# Patient Record
Sex: Female | Born: 1950
Health system: Southern US, Community
[De-identification: ages and names within clinical notes are randomized; demographics above are authoritative.]

## PROBLEM LIST (undated history)

## (undated) DIAGNOSIS — I219 Acute myocardial infarction, unspecified: Secondary | ICD-10-CM

## (undated) DIAGNOSIS — E119 Type 2 diabetes mellitus without complications: Secondary | ICD-10-CM

## (undated) DIAGNOSIS — K5792 Diverticulitis of intestine, part unspecified, without perforation or abscess without bleeding: Secondary | ICD-10-CM

## (undated) DIAGNOSIS — K317 Polyp of stomach and duodenum: Secondary | ICD-10-CM

## (undated) DIAGNOSIS — G4730 Sleep apnea, unspecified: Secondary | ICD-10-CM

## (undated) DIAGNOSIS — M7050 Other bursitis of knee, unspecified knee: Secondary | ICD-10-CM

## (undated) DIAGNOSIS — S139XXA Sprain of joints and ligaments of unspecified parts of neck, initial encounter: Secondary | ICD-10-CM

## (undated) DIAGNOSIS — K589 Irritable bowel syndrome without diarrhea: Secondary | ICD-10-CM

## (undated) DIAGNOSIS — G8929 Other chronic pain: Secondary | ICD-10-CM

## (undated) DIAGNOSIS — M797 Fibromyalgia: Secondary | ICD-10-CM

## (undated) DIAGNOSIS — N281 Cyst of kidney, acquired: Secondary | ICD-10-CM

## (undated) DIAGNOSIS — Z973 Presence of spectacles and contact lenses: Secondary | ICD-10-CM

## (undated) DIAGNOSIS — Z8719 Personal history of other diseases of the digestive system: Secondary | ICD-10-CM

## (undated) DIAGNOSIS — E1142 Type 2 diabetes mellitus with diabetic polyneuropathy: Secondary | ICD-10-CM

## (undated) DIAGNOSIS — E785 Hyperlipidemia, unspecified: Secondary | ICD-10-CM

## (undated) DIAGNOSIS — M549 Dorsalgia, unspecified: Secondary | ICD-10-CM

## (undated) DIAGNOSIS — F329 Major depressive disorder, single episode, unspecified: Secondary | ICD-10-CM

## (undated) DIAGNOSIS — M1990 Unspecified osteoarthritis, unspecified site: Secondary | ICD-10-CM

## (undated) DIAGNOSIS — G43009 Migraine without aura, not intractable, without status migrainosus: Secondary | ICD-10-CM

## (undated) DIAGNOSIS — K219 Gastro-esophageal reflux disease without esophagitis: Secondary | ICD-10-CM

## (undated) DIAGNOSIS — E669 Obesity, unspecified: Secondary | ICD-10-CM

## (undated) DIAGNOSIS — I776 Arteritis, unspecified: Secondary | ICD-10-CM

## (undated) DIAGNOSIS — I10 Essential (primary) hypertension: Secondary | ICD-10-CM

## (undated) DIAGNOSIS — K3184 Gastroparesis: Secondary | ICD-10-CM

## (undated) DIAGNOSIS — R413 Other amnesia: Secondary | ICD-10-CM

## (undated) DIAGNOSIS — K862 Cyst of pancreas: Secondary | ICD-10-CM

## (undated) DIAGNOSIS — F419 Anxiety disorder, unspecified: Secondary | ICD-10-CM

## (undated) DIAGNOSIS — F32A Depression, unspecified: Secondary | ICD-10-CM

## (undated) DIAGNOSIS — C55 Malignant neoplasm of uterus, part unspecified: Secondary | ICD-10-CM

## (undated) DIAGNOSIS — R51 Headache: Secondary | ICD-10-CM

## (undated) DIAGNOSIS — K760 Fatty (change of) liver, not elsewhere classified: Secondary | ICD-10-CM

## (undated) HISTORY — PX: APPENDECTOMY: SHX54

## (undated) HISTORY — DX: Migraine without aura, not intractable, without status migrainosus: G43.009

## (undated) HISTORY — DX: Fatty (change of) liver, not elsewhere classified: K76.0

## (undated) HISTORY — DX: Sprain of joints and ligaments of unspecified parts of neck, initial encounter: S13.9XXA

## (undated) HISTORY — DX: Essential (primary) hypertension: I10

## (undated) HISTORY — DX: Personal history of other diseases of the digestive system: Z87.19

## (undated) HISTORY — DX: Major depressive disorder, single episode, unspecified: F32.9

## (undated) HISTORY — DX: Depression, unspecified: F32.A

## (undated) HISTORY — DX: Acute myocardial infarction, unspecified: I21.9

## (undated) HISTORY — DX: Hyperlipidemia, unspecified: E78.5

## (undated) HISTORY — DX: Type 2 diabetes mellitus with diabetic polyneuropathy: E11.42

## (undated) HISTORY — DX: Sleep apnea, unspecified: G47.30

## (undated) HISTORY — DX: Diverticulitis of intestine, part unspecified, without perforation or abscess without bleeding: K57.92

## (undated) HISTORY — DX: Anxiety disorder, unspecified: F41.9

## (undated) HISTORY — PX: TUBAL LIGATION: SHX77

## (undated) HISTORY — DX: Polyp of stomach and duodenum: K31.7

## (undated) HISTORY — DX: Cyst of kidney, acquired: N28.1

## (undated) HISTORY — DX: Fibromyalgia: M79.7

## (undated) HISTORY — DX: Malignant neoplasm of uterus, part unspecified: C55

## (undated) HISTORY — DX: Obesity, unspecified: E66.9

## (undated) HISTORY — DX: Cyst of pancreas: K86.2

## (undated) HISTORY — DX: Gastro-esophageal reflux disease without esophagitis: K21.9

## (undated) HISTORY — DX: Type 2 diabetes mellitus without complications: E11.9

## (undated) HISTORY — DX: Other amnesia: R41.3

## (undated) HISTORY — DX: Headache: R51

## (undated) HISTORY — DX: Irritable bowel syndrome, unspecified: K58.9

## (undated) HISTORY — DX: Unspecified osteoarthritis, unspecified site: M19.90

## (undated) HISTORY — PX: COLONOSCOPY: SHX174

---

## 1988-08-12 HISTORY — PX: LAPAROSCOPIC CHOLECYSTECTOMY: SUR755

## 1988-08-12 HISTORY — PX: BREAST CYST EXCISION: SHX579

## 1998-02-11 ENCOUNTER — Emergency Department (HOSPITAL_COMMUNITY): Admission: EM | Admit: 1998-02-11 | Discharge: 1998-02-11 | Payer: Self-pay | Admitting: Emergency Medicine

## 1998-08-12 HISTORY — PX: SINUS SURGERY WITH INSTATRAK: SHX5215

## 1998-08-12 HISTORY — PX: VAGINAL HYSTERECTOMY: SUR661

## 1999-01-08 ENCOUNTER — Ambulatory Visit (HOSPITAL_COMMUNITY): Admission: RE | Admit: 1999-01-08 | Discharge: 1999-01-08 | Payer: Self-pay | Admitting: Endocrinology

## 1999-01-26 ENCOUNTER — Ambulatory Visit (HOSPITAL_COMMUNITY): Admission: RE | Admit: 1999-01-26 | Discharge: 1999-01-26 | Payer: Self-pay | Admitting: Gynecology

## 1999-03-01 ENCOUNTER — Ambulatory Visit (HOSPITAL_COMMUNITY): Admission: RE | Admit: 1999-03-01 | Discharge: 1999-03-01 | Payer: Self-pay | Admitting: Gynecology

## 1999-03-01 ENCOUNTER — Encounter (INDEPENDENT_AMBULATORY_CARE_PROVIDER_SITE_OTHER): Payer: Self-pay | Admitting: Specialist

## 1999-03-08 ENCOUNTER — Ambulatory Visit (HOSPITAL_COMMUNITY): Admission: RE | Admit: 1999-03-08 | Discharge: 1999-03-08 | Payer: Self-pay | Admitting: Gynecology

## 1999-03-08 ENCOUNTER — Encounter: Payer: Self-pay | Admitting: Gynecology

## 1999-03-09 ENCOUNTER — Encounter: Payer: Self-pay | Admitting: Gynecology

## 1999-03-12 ENCOUNTER — Inpatient Hospital Stay (HOSPITAL_COMMUNITY): Admission: RE | Admit: 1999-03-12 | Discharge: 1999-03-14 | Payer: Self-pay | Admitting: Gynecology

## 1999-03-12 ENCOUNTER — Encounter (INDEPENDENT_AMBULATORY_CARE_PROVIDER_SITE_OTHER): Payer: Self-pay

## 1999-04-23 ENCOUNTER — Ambulatory Visit: Admission: RE | Admit: 1999-04-23 | Discharge: 1999-04-23 | Payer: Self-pay | Admitting: Otolaryngology

## 1999-05-01 ENCOUNTER — Ambulatory Visit (HOSPITAL_COMMUNITY): Admission: RE | Admit: 1999-05-01 | Discharge: 1999-05-01 | Payer: Self-pay | Admitting: Internal Medicine

## 1999-05-23 ENCOUNTER — Encounter (INDEPENDENT_AMBULATORY_CARE_PROVIDER_SITE_OTHER): Payer: Self-pay | Admitting: Specialist

## 1999-05-23 ENCOUNTER — Observation Stay (HOSPITAL_COMMUNITY): Admission: RE | Admit: 1999-05-23 | Discharge: 1999-05-24 | Payer: Self-pay | Admitting: Otolaryngology

## 1999-09-19 ENCOUNTER — Emergency Department (HOSPITAL_COMMUNITY): Admission: EM | Admit: 1999-09-19 | Discharge: 1999-09-20 | Payer: Self-pay | Admitting: Emergency Medicine

## 2000-03-20 ENCOUNTER — Encounter: Payer: Self-pay | Admitting: Gynecology

## 2000-03-20 ENCOUNTER — Ambulatory Visit (HOSPITAL_COMMUNITY): Admission: RE | Admit: 2000-03-20 | Discharge: 2000-03-20 | Payer: Self-pay | Admitting: Gynecology

## 2000-05-19 ENCOUNTER — Other Ambulatory Visit: Admission: RE | Admit: 2000-05-19 | Discharge: 2000-05-19 | Payer: Self-pay | Admitting: Gynecology

## 2000-07-27 ENCOUNTER — Emergency Department (HOSPITAL_COMMUNITY): Admission: EM | Admit: 2000-07-27 | Discharge: 2000-07-28 | Payer: Self-pay | Admitting: Emergency Medicine

## 2000-07-28 ENCOUNTER — Encounter: Payer: Self-pay | Admitting: Emergency Medicine

## 2000-07-28 ENCOUNTER — Encounter: Payer: Self-pay | Admitting: *Deleted

## 2000-12-09 ENCOUNTER — Other Ambulatory Visit: Admission: RE | Admit: 2000-12-09 | Discharge: 2000-12-09 | Payer: Self-pay | Admitting: Gynecology

## 2001-01-06 ENCOUNTER — Ambulatory Visit (HOSPITAL_BASED_OUTPATIENT_CLINIC_OR_DEPARTMENT_OTHER): Admission: RE | Admit: 2001-01-06 | Discharge: 2001-01-06 | Payer: Self-pay | Admitting: Orthopedic Surgery

## 2001-02-17 ENCOUNTER — Ambulatory Visit (HOSPITAL_BASED_OUTPATIENT_CLINIC_OR_DEPARTMENT_OTHER): Admission: RE | Admit: 2001-02-17 | Discharge: 2001-02-17 | Payer: Self-pay | Admitting: Orthopedic Surgery

## 2001-03-23 ENCOUNTER — Ambulatory Visit (HOSPITAL_COMMUNITY): Admission: RE | Admit: 2001-03-23 | Discharge: 2001-03-23 | Payer: Self-pay | Admitting: Gynecology

## 2001-03-23 ENCOUNTER — Encounter: Payer: Self-pay | Admitting: Gynecology

## 2001-06-25 ENCOUNTER — Other Ambulatory Visit: Admission: RE | Admit: 2001-06-25 | Discharge: 2001-06-25 | Payer: Self-pay | Admitting: Gynecology

## 2001-09-17 ENCOUNTER — Encounter: Payer: Self-pay | Admitting: Emergency Medicine

## 2001-09-17 ENCOUNTER — Emergency Department (HOSPITAL_COMMUNITY): Admission: EM | Admit: 2001-09-17 | Discharge: 2001-09-17 | Payer: Self-pay | Admitting: Emergency Medicine

## 2001-12-14 ENCOUNTER — Encounter: Admission: RE | Admit: 2001-12-14 | Discharge: 2001-12-14 | Payer: Self-pay | Admitting: Endocrinology

## 2001-12-14 ENCOUNTER — Encounter: Payer: Self-pay | Admitting: Endocrinology

## 2001-12-21 ENCOUNTER — Other Ambulatory Visit: Admission: RE | Admit: 2001-12-21 | Discharge: 2001-12-21 | Payer: Self-pay | Admitting: Gynecology

## 2002-08-09 ENCOUNTER — Other Ambulatory Visit: Admission: RE | Admit: 2002-08-09 | Discharge: 2002-08-09 | Payer: Self-pay | Admitting: Gynecology

## 2003-05-06 ENCOUNTER — Ambulatory Visit (HOSPITAL_COMMUNITY): Admission: RE | Admit: 2003-05-06 | Discharge: 2003-05-06 | Payer: Self-pay | Admitting: Endocrinology

## 2003-07-17 ENCOUNTER — Ambulatory Visit (HOSPITAL_BASED_OUTPATIENT_CLINIC_OR_DEPARTMENT_OTHER): Admission: RE | Admit: 2003-07-17 | Discharge: 2003-07-17 | Payer: Self-pay | Admitting: Internal Medicine

## 2003-07-26 ENCOUNTER — Other Ambulatory Visit: Admission: RE | Admit: 2003-07-26 | Discharge: 2003-07-26 | Payer: Self-pay | Admitting: Gynecology

## 2003-09-27 ENCOUNTER — Ambulatory Visit (HOSPITAL_COMMUNITY): Admission: RE | Admit: 2003-09-27 | Discharge: 2003-09-27 | Payer: Self-pay | Admitting: Internal Medicine

## 2003-09-27 ENCOUNTER — Encounter: Payer: Self-pay | Admitting: Internal Medicine

## 2004-05-22 ENCOUNTER — Encounter: Admission: RE | Admit: 2004-05-22 | Discharge: 2004-05-22 | Payer: Self-pay | Admitting: Endocrinology

## 2004-08-09 ENCOUNTER — Other Ambulatory Visit: Admission: RE | Admit: 2004-08-09 | Discharge: 2004-08-09 | Payer: Self-pay | Admitting: Gynecology

## 2004-10-10 ENCOUNTER — Ambulatory Visit: Payer: Self-pay | Admitting: Internal Medicine

## 2005-06-21 ENCOUNTER — Encounter: Admission: RE | Admit: 2005-06-21 | Discharge: 2005-06-21 | Payer: Self-pay | Admitting: Gynecology

## 2005-07-26 ENCOUNTER — Other Ambulatory Visit: Admission: RE | Admit: 2005-07-26 | Discharge: 2005-07-26 | Payer: Self-pay | Admitting: Gynecology

## 2006-05-08 ENCOUNTER — Ambulatory Visit: Payer: Self-pay | Admitting: Gastroenterology

## 2006-05-08 ENCOUNTER — Emergency Department (HOSPITAL_COMMUNITY): Admission: EM | Admit: 2006-05-08 | Discharge: 2006-05-08 | Payer: Self-pay | Admitting: *Deleted

## 2006-05-11 ENCOUNTER — Emergency Department (HOSPITAL_COMMUNITY): Admission: EM | Admit: 2006-05-11 | Discharge: 2006-05-12 | Payer: Self-pay | Admitting: Emergency Medicine

## 2006-05-11 ENCOUNTER — Encounter (INDEPENDENT_AMBULATORY_CARE_PROVIDER_SITE_OTHER): Payer: Self-pay | Admitting: *Deleted

## 2006-05-19 ENCOUNTER — Ambulatory Visit: Payer: Self-pay | Admitting: Internal Medicine

## 2006-06-12 ENCOUNTER — Ambulatory Visit: Payer: Self-pay | Admitting: Internal Medicine

## 2006-06-26 ENCOUNTER — Encounter (INDEPENDENT_AMBULATORY_CARE_PROVIDER_SITE_OTHER): Payer: Self-pay | Admitting: Specialist

## 2006-06-26 ENCOUNTER — Ambulatory Visit: Payer: Self-pay | Admitting: Internal Medicine

## 2006-07-01 ENCOUNTER — Ambulatory Visit: Payer: Self-pay | Admitting: Internal Medicine

## 2006-07-14 ENCOUNTER — Encounter: Admission: RE | Admit: 2006-07-14 | Discharge: 2006-07-14 | Payer: Self-pay | Admitting: Gynecology

## 2006-09-05 ENCOUNTER — Encounter: Admission: RE | Admit: 2006-09-05 | Discharge: 2006-12-04 | Payer: Self-pay | Admitting: Endocrinology

## 2006-10-27 ENCOUNTER — Other Ambulatory Visit: Admission: RE | Admit: 2006-10-27 | Discharge: 2006-10-27 | Payer: Self-pay | Admitting: Gynecology

## 2006-11-24 ENCOUNTER — Encounter: Admission: RE | Admit: 2006-11-24 | Discharge: 2007-02-22 | Payer: Self-pay | Admitting: Endocrinology

## 2007-03-05 ENCOUNTER — Encounter: Admission: RE | Admit: 2007-03-05 | Discharge: 2007-05-06 | Payer: Self-pay | Admitting: Endocrinology

## 2007-10-19 ENCOUNTER — Ambulatory Visit: Payer: Self-pay | Admitting: Internal Medicine

## 2008-01-12 ENCOUNTER — Encounter: Admission: RE | Admit: 2008-01-12 | Discharge: 2008-01-12 | Payer: Self-pay | Admitting: Gynecology

## 2009-03-01 ENCOUNTER — Encounter: Admission: RE | Admit: 2009-03-01 | Discharge: 2009-03-01 | Payer: Self-pay | Admitting: Gynecology

## 2009-03-05 ENCOUNTER — Emergency Department (HOSPITAL_COMMUNITY): Admission: EM | Admit: 2009-03-05 | Discharge: 2009-03-05 | Payer: Self-pay | Admitting: Emergency Medicine

## 2009-04-28 ENCOUNTER — Emergency Department (HOSPITAL_COMMUNITY): Admission: EM | Admit: 2009-04-28 | Discharge: 2009-04-28 | Payer: Self-pay | Admitting: Emergency Medicine

## 2009-06-21 ENCOUNTER — Encounter: Admission: RE | Admit: 2009-06-21 | Discharge: 2009-08-10 | Payer: Self-pay | Admitting: Endocrinology

## 2009-08-14 ENCOUNTER — Encounter
Admission: RE | Admit: 2009-08-14 | Discharge: 2009-08-14 | Payer: Self-pay | Source: Home / Self Care | Admitting: Endocrinology

## 2010-08-22 ENCOUNTER — Telehealth: Payer: Self-pay | Admitting: Internal Medicine

## 2010-08-24 ENCOUNTER — Ambulatory Visit
Admission: RE | Admit: 2010-08-24 | Discharge: 2010-08-24 | Payer: Self-pay | Source: Home / Self Care | Attending: Gastroenterology | Admitting: Gastroenterology

## 2010-08-24 ENCOUNTER — Other Ambulatory Visit: Payer: Self-pay | Admitting: Nurse Practitioner

## 2010-08-24 DIAGNOSIS — M797 Fibromyalgia: Secondary | ICD-10-CM | POA: Insufficient documentation

## 2010-08-24 DIAGNOSIS — E785 Hyperlipidemia, unspecified: Secondary | ICD-10-CM | POA: Insufficient documentation

## 2010-08-24 DIAGNOSIS — E119 Type 2 diabetes mellitus without complications: Secondary | ICD-10-CM | POA: Insufficient documentation

## 2010-08-24 DIAGNOSIS — I10 Essential (primary) hypertension: Secondary | ICD-10-CM | POA: Insufficient documentation

## 2010-08-24 DIAGNOSIS — K589 Irritable bowel syndrome without diarrhea: Secondary | ICD-10-CM | POA: Insufficient documentation

## 2010-08-24 DIAGNOSIS — E1165 Type 2 diabetes mellitus with hyperglycemia: Secondary | ICD-10-CM | POA: Insufficient documentation

## 2010-08-24 DIAGNOSIS — F329 Major depressive disorder, single episode, unspecified: Secondary | ICD-10-CM | POA: Insufficient documentation

## 2010-08-24 DIAGNOSIS — K7689 Other specified diseases of liver: Secondary | ICD-10-CM | POA: Insufficient documentation

## 2010-08-24 DIAGNOSIS — M129 Arthropathy, unspecified: Secondary | ICD-10-CM | POA: Insufficient documentation

## 2010-08-24 DIAGNOSIS — F411 Generalized anxiety disorder: Secondary | ICD-10-CM | POA: Insufficient documentation

## 2010-08-24 DIAGNOSIS — F419 Anxiety disorder, unspecified: Secondary | ICD-10-CM | POA: Insufficient documentation

## 2010-08-24 DIAGNOSIS — R110 Nausea: Secondary | ICD-10-CM | POA: Insufficient documentation

## 2010-08-24 DIAGNOSIS — Z8542 Personal history of malignant neoplasm of other parts of uterus: Secondary | ICD-10-CM | POA: Insufficient documentation

## 2010-08-24 DIAGNOSIS — F32A Depression, unspecified: Secondary | ICD-10-CM | POA: Insufficient documentation

## 2010-08-24 DIAGNOSIS — R1011 Right upper quadrant pain: Secondary | ICD-10-CM | POA: Insufficient documentation

## 2010-08-24 DIAGNOSIS — K219 Gastro-esophageal reflux disease without esophagitis: Secondary | ICD-10-CM | POA: Insufficient documentation

## 2010-08-24 DIAGNOSIS — Z8719 Personal history of other diseases of the digestive system: Secondary | ICD-10-CM | POA: Insufficient documentation

## 2010-08-24 DIAGNOSIS — G4733 Obstructive sleep apnea (adult) (pediatric): Secondary | ICD-10-CM | POA: Insufficient documentation

## 2010-08-24 LAB — CBC WITH DIFFERENTIAL/PLATELET
Basophils Absolute: 0 10*3/uL (ref 0.0–0.1)
Basophils Relative: 0.3 % (ref 0.0–3.0)
Eosinophils Absolute: 0 10*3/uL (ref 0.0–0.7)
Eosinophils Relative: 0.1 % (ref 0.0–5.0)
HCT: 42.2 % (ref 36.0–46.0)
Hemoglobin: 14.5 g/dL (ref 12.0–15.0)
Lymphocytes Relative: 30.1 % (ref 12.0–46.0)
Lymphs Abs: 2.6 10*3/uL (ref 0.7–4.0)
MCHC: 34.4 g/dL (ref 30.0–36.0)
MCV: 90.7 fl (ref 78.0–100.0)
Monocytes Absolute: 0.7 10*3/uL (ref 0.1–1.0)
Monocytes Relative: 8.3 % (ref 3.0–12.0)
Neutro Abs: 5.2 10*3/uL (ref 1.4–7.7)
Neutrophils Relative %: 61.2 % (ref 43.0–77.0)
Platelets: 243 10*3/uL (ref 150.0–400.0)
RBC: 4.65 Mil/uL (ref 3.87–5.11)
RDW: 12.5 % (ref 11.5–14.6)
WBC: 8.6 10*3/uL (ref 4.5–10.5)

## 2010-08-24 LAB — COMPREHENSIVE METABOLIC PANEL
ALT: 68 U/L — ABNORMAL HIGH (ref 0–35)
AST: 52 U/L — ABNORMAL HIGH (ref 0–37)
Albumin: 3.8 g/dL (ref 3.5–5.2)
Alkaline Phosphatase: 119 U/L — ABNORMAL HIGH (ref 39–117)
BUN: 14 mg/dL (ref 6–23)
CO2: 32 mEq/L (ref 19–32)
Calcium: 9.1 mg/dL (ref 8.4–10.5)
Chloride: 99 mEq/L (ref 96–112)
Creatinine, Ser: 0.6 mg/dL (ref 0.4–1.2)
GFR: 115.39 mL/min (ref >60.00)
Glucose, Bld: 314 mg/dL — ABNORMAL HIGH (ref 70–99)
Potassium: 4.6 mEq/L (ref 3.5–5.1)
Sodium: 138 mEq/L (ref 135–145)
Total Bilirubin: 0.9 mg/dL (ref 0.3–1.2)
Total Protein: 7 g/dL (ref 6.0–8.3)

## 2010-08-28 ENCOUNTER — Ambulatory Visit (HOSPITAL_COMMUNITY)
Admission: RE | Admit: 2010-08-28 | Discharge: 2010-08-28 | Payer: Self-pay | Source: Home / Self Care | Attending: Gastroenterology | Admitting: Gastroenterology

## 2010-08-29 ENCOUNTER — Encounter: Payer: Self-pay | Admitting: Nurse Practitioner

## 2010-08-29 ENCOUNTER — Telehealth: Payer: Self-pay | Admitting: Nurse Practitioner

## 2010-08-29 ENCOUNTER — Other Ambulatory Visit: Payer: Self-pay | Admitting: Nurse Practitioner

## 2010-08-29 ENCOUNTER — Ambulatory Visit
Admission: RE | Admit: 2010-08-29 | Discharge: 2010-08-29 | Payer: Self-pay | Source: Home / Self Care | Attending: Nurse Practitioner | Admitting: Nurse Practitioner

## 2010-08-29 LAB — IBC PANEL
Iron: 98 ug/dL (ref 42–145)
Saturation Ratios: 25 % (ref 20.0–50.0)
Transferrin: 280.5 mg/dL (ref 212.0–360.0)

## 2010-08-29 LAB — CONVERTED CEMR LAB
A-1 Antitrypsin, Ser: 142 mg/dL (ref 83–200)
Anti Nuclear Antibody(ANA): NEGATIVE
Ceruloplasmin: 39 mg/dL (ref 21–63)
HCV Ab: NEGATIVE
Hepatitis B Surface Ag: NEGATIVE

## 2010-08-29 LAB — FERRITIN: Ferritin: 119.5 ng/mL (ref 10.0–291.0)

## 2010-09-02 ENCOUNTER — Encounter: Payer: Self-pay | Admitting: Gynecology

## 2010-09-13 ENCOUNTER — Telehealth: Payer: Self-pay | Admitting: Nurse Practitioner

## 2010-09-13 NOTE — Progress Notes (Signed)
Summary: Results   Phone Note Call from Patient Call back at Home Phone [phone]   Caller: Patient Call For: Gunnar Fusi Reason for Call: Talk to Nurse Summary of Call: Pt would like to speak with Pam about her results Initial call taken by: [place] Johnson,  August 29, 2010 10:14 AM  Follow-up for Phone Call        I spoke to the pt and she asked about Fatty Liver disease. I did explain what it is. I did adivse her that [name] ACNP and Dr Christella Hartigan feels that she should have some more liver blood tests.  I advised pt to come to our lab , basement level. Also made her a follow up appt with Dr. Lina Sar on 10-01-2010 at 8:45 AM.  Follow-up by: Joselyn Glassman,  August 29, 2010 2:00 PM

## 2010-09-13 NOTE — Assessment & Plan Note (Signed)
Summary: abd pain, change in stool/[place]    History of Present Illness Visit Type: follow up Primary GI MD: Lina Sar MD Primary Provider: Adela Lank, MD Requesting Provider: n/a Chief Complaint: Patient c/o RUQ abdominal pain and bloating x 1 week. She also c/o intermittent nausea and loss of appetite as well as change in bowels. She has noticed "pencil like" stools. History of Present Illness:   Patient followed in the past by Dr. Juanda Chance. She was last seen in 2007 for history of IBS and GERD.  She had a normal colonoscopy Nov. 2007. Patient presents with a one week history of bloating and throbbing RUQ pain. Has had this pain a few times before but it only lasted a day and wasn't ever as severe. Pain radiates through to right back. Pain slightly worse with meals, it is not related to defecation. She very rarely takes any NSAIDS. She has some mild nausea and chills as well.  On Aciphex, ran out of pills and realized how much she needed a PPI. Back on Aciphex and asymptomatic from GERD standpoint. Patient has been voluntarily losing weight. She has lost 20 pounds or so over the last year.  Her BMs are overall okay except having some pencil size stool. Often feels constipated.        GI Review of Systems    Reports abdominal pain, bloating, loss of appetite, and  nausea.     Location of  Abdominal pain: RUQ.    Denies acid reflux, belching, chest pain, dysphagia with liquids, dysphagia with solids, heartburn, vomiting, vomiting blood, weight loss, and  weight gain.      Reports change in bowel habits, constipation, fecal incontinence, irritable bowel syndrome, and  liver problems.     Denies anal fissure, black tarry stools, diarrhea, diverticulosis, heme positive stool, hemorrhoids, jaundice, light color stool, rectal bleeding, and  rectal pain. Preventive Screening-Counseling & Management  Alcohol-Tobacco     Smoking Status: never      Drug Use:  no.      Current Medications  (verified): 1)  Wellbutrin Xl 300 Mg Xr24h-Tab (Bupropion Hcl) .... Take 1 Tablet By Mouth Once Daily 2)  Alprazolam 0.5 Mg Tabs (Alprazolam) .... Take 1 Tablet By Mouth Two Times A Day As Needed 3)  Nabumetone 500 Mg Tabs (Nabumetone) .... Take 1 Tablet By Mouth Four Time Daily As Needed 4)  Xylopan 5 Mg Tablet .... Take 1 Tablet By Mouth At Bedtime 5)  Tramadol Hcl 50 Mg Tabs (Tramadol Hcl) .... Take 1 Tablet By Mouth Four Times Daily As Needed 6)  Crestor 10 Mg Tabs (Rosuvastatin Calcium) .... Take 1 Tablet By Mouth Once A Day 7)  Avapro 300 Mg Tabs (Irbesartan) .... Take 1 Tablet By Mouth Once A Day 8)  Toprol Xl 50 Mg Xr24h-Tab (Metoprolol Succinate) .... Take 1 Tablet By Mouth Once A Day 9)  Aciphex 20 Mg Tbec (Rabeprazole Sodium) .... Take 1 Tablet By Mouth Two Times A Day 10)  Multivitamins  Tabs (Multiple Vitamin) .... Take 1 Tablet By Mouth Once A Day 11)  Fish Oil Double Strength 1200 Mg Caps (Omega-3 Fatty Acids) .... Take 1 Capsule By Mouth Once Daily 12)  Vitamin D3 2000 Unit Caps (Cholecalciferol) .... Take 1 Tablet By Mouth Once Daily 13)  Vitamin B-6 Cr 200 Mg Cr-Tabs (Pyridoxine Hcl) .... Take 1 Tablet By Mouth Once Daily 14)  Vitamin B12 (Unknown Dosage) Tablet .... Take 1 Tablet By Mouth Once A Day 15)  Effexor Xr  150 Mg Xr24h-Cap (Venlafaxine Hcl) .... Take 1 Tablet By Mouth Two Times A Day  Allergies (verified): 1)  ! Sulfa 2)  ! Codeine  Past History:  Past Medical History: Current Problems:  GASTRITIS, HX OF (ICD-V12.79) GERD (ICD-530.81) IRRITABLE BOWEL SYNDROME (ICD-564.1) DEPRESSION (ICD-311) HYPERTENSION (ICD-401.9) HYPERLIPIDEMIA (ICD-272.4) SLEEP APNEA (ICD-780.57) DIABETES MELLITUS, TYPE II (ICD-250.00) FIBROMYALGIA (ICD-729.1) FATTY LIVER DISEASE (ICD-571.8) ARTHRITIS (ICD-716.90) ANXIETY (ICD-300.00) UTERINE CANCER, HX OF (ICD-V10.42)    Past Surgical History: Appendectomy Hysterectomy Cholecystectomy  Family History: Family History of  Breast Cancer: Family History of Colon Cancer: Maternal Uncle Family History of Ovarian Cancer: Aunt Family History of Stomach Cancer: Aunt Family History of Colon Polyps: Father Family History of Diabetes: Aunt Family History of Heart Disease:  Father, Uncle, Grandparents Family History of Irritable Bowel Syndrome: Daughter, 1/2 brother  Social History: Occupation: Retired Patient has never smoked.  Alcohol Use - no Daily Caffeine Use Illicit Drug Use - no Smoking Status:  never Drug Use:  no  Review of Systems  The patient denies allergy/sinus, anemia, anxiety-new, arthritis/joint pain, back pain, blood in urine, breast changes/lumps, change in vision, confusion, cough, coughing up blood, depression-new, fainting, fatigue, fever, headaches-new, hearing problems, heart murmur, heart rhythm changes, itching, menstrual pain, muscle pains/cramps, night sweats, nosebleeds, pregnancy symptoms, shortness of breath, skin rash, sleeping problems, sore throat, swelling of feet/legs, swollen lymph glands, thirst - excessive , urination - excessive , urination changes/pain, urine leakage, vision changes, and voice change.    Vital Signs:  Patient profile:   60 year old female Height:      65 inches Weight:      183.38 pounds BMI:     30.63 BSA:     1.91 Pulse rate:   84 / minute Pulse rhythm:   regular BP sitting:   142 / 80  (left arm)  Vitals Entered By: Merri Ray CMA (AAMA) (August 24, 2010 8:21 AM)  Physical Exam  General:  Well developed, well nourished, no acute distress. Head:  Normocephalic and atraumatic. Eyes:  Conjunctiva pink, no icterus.  Neck:  no obvious masses  Lungs:  Clear throughout to auscultation. Heart:  Regular rate and rhythm; no murmurs, rubs,  or bruits. Abdomen:  Abdomen soft,  nondistended. Liver size difficult to assessNo obvious masses or hepatomegaly.Normal bowel sounds.  Msk:  Symmetrical with no gross deformities. Normal  posture. Extremities:  No palmar erythema, no edema.  Neurologic:  Alert and  oriented x4;  grossly normal neurologically. Skin:  Intact without significant lesions or rashes. Cervical Nodes:  No significant cervical adenopathy. Psych:  Alert and cooperative. Normal mood and affect.   Impression & Recommendations:  Problem # 1:  ABDOMINAL PAIN-RUQ (ICD-789.01) Assessment Deteriorated Ultrasound in 2007 revealed severe increase in liver echodensity suggestive for fatty liver or parenchymal liver disease. Patient has had a cholecystectomy. Her RUQ has been associated with nausea and occasional chills. Etiology of symptoms not clear. She had an unremarkable EGD in 2007 for nausea / vomiting and RUQ pain. Will check basic labs and obtain ultrasound of the abdomen.  Trial of Bentyl for pain.  Patient will be called with test results and any further recommendations based on those results.    Orders: Ultrasound Abdomen (UAS) TLB-CBC Platelet - w/Differential (85025-CBCD) TLB-CMP (Comprehensive Metabolic Pnl) (80053-COMP)  Problem # 2:  GERD (ICD-530.81) Assessment: Comment Only Asymptomatic on PPI.  Problem # 3:  IRRITABLE BOWEL SYNDROME (ICD-564.1) Assessment: New Complains of pencil size stools as of late. She has IBS  and suspect stool caliber change secondary to that. Normal colonoscopy November 2007.  Patient Instructions: 1)  Please go to lab, basement level. 2)  [facility].  Directions provided. 3)  We sent a perscription for Bentyl to [facility] [address] 4)  Copy sent to : Adela Lank, MD 5)  The medication list was reviewed and reconciled.  All changed / newly prescribed medications were explained.  A complete medication list was provided to the patient / caregiver. Prescriptions: BENTYL 10 MG CAPS (DICYCLOMINE HCL) Take 1 tab twice daily as needed for pain  #60 x 1   Entered by:   Lowry Ram NCMA   Authorized by:    [name] NP   Signed by:   Lowry Ram NCMA on 08/24/2010   Method used:   Electronically to        [facility]  [address] [id number] ([facility])       [address]       [place], [place]  [address]       Ph: [phone] or [phone]       Fax: [phone]   RxID:   [id number]

## 2010-09-13 NOTE — Procedures (Signed)
Summary: [facility] EGD   EGD  Procedure date:  06/26/2006  Findings:      [facility]   Patient Name: Alexis [name], Alexis [name]. MRN:  Procedure Procedures: Panendoscopy (EGD) CPT: 43235.    with biopsy(s)/brushing(s). CPT: D1846139.  Personnel: Endoscopist: Saira Kramme L. Juanda Chance, MD.  Exam Location: Exam performed in Outpatient Clinic. Outpatient  Patient Consent: Procedure, Alternatives, Risks and Benefits discussed, consent obtained, from patient. Consent was obtained by the RN.  Indications Symptoms: Nausea. Vomiting.  Surveillance of: 2005.  History  Current Medications: Patient is not currently taking Coumadin.  Pre-Exam Physical: Performed Jun 26, 2006  Cardio-pulmonary exam, HEENT exam, Abdominal exam, Extremity exam, Neurological exam, Mental status exam WNL.  Comments: Pt. history reviewed/updated, physical exam performed prior to initiation of sedation?yes Exam Exam Info: Maximum depth of insertion Duodenum, intended Duodenum. Vocal cords visualized. Gastric retroflexion performed. Images taken. ASA Classification: II. Tolerance: good.  Sedation Meds: Patient assessed and found to be appropriate for moderate (conscious) sedation. Fentanyl 50 mcg. given IV. Versed 5 mg. given IV. Cetacaine Spray 2 sprays given aerosolized.  Monitoring: BP and pulse monitoring done. Oximetry used. Supplemental O2 given  Findings - Normal: Distal Esophagus. Biopsy/Normal taken.  - Normal: Body. Comments: no food retention.  DIAGNOSTIC TEST: from Antrum. RUT done, results pending  Normal: Duodenal Apex. Biopsy/Normal taken.   Assessment Normal examination.  Comments: nothing to account for pt's symptoms Events  Unplanned Intervention: No unplanned interventions were required.  Unplanned Events: There were no complications. Plans Medication(s): Await pathology. PPI: Aciphex 20 mg BID, starting Jun 26, 2006  Promotility: Metaclopramide 10 mg ac, starting Jun 26, 2006   Disposition: After procedure patient sent to recovery. After recovery patient sent home.   cc: Adela Lank, MD  This report was created from the original endoscopy report, which was reviewed and signed by the above listed endoscopist.

## 2010-09-13 NOTE — Progress Notes (Signed)
Summary: triage   Phone Note Call from Patient Call back at [phone]   Caller: Patient Call For: Dr Juanda Chance Reason for Call: Talk to Nurse Summary of Call: Patient has severe right side pain very tender and her bowels are very thin, wants an appt but theres nothing avaiable. Initial call taken by: Tawni Levy,  August 22, 2010 10:54 AM  Follow-up for Phone Call        Patient calling to report that since last Friday, she has had pain under her right breast to waist that radiates to her back. Right side of stomach "hurts like a tooth ache." She has noticed for awhile now that off and on her stools are pencil shaped. Stools are brown in color without obvious blood. Denies fever, nausea or vomiting. She is taking Aciphex two times a day. Hx of hiatal hernia, IBS, GERD, cholecystecomy in 1990, uterine cancer s/p hysterectomy 2000. Dr Juanda Chance, you have an opening on 1/13 or I can put her on Paula's schedule tomorrow if you want her seen. Please, advise. Follow-up by: Jesse Fall RN,  August 22, 2010 11:45 AM  Additional Follow-up for Phone Call Additional follow up Details #1::        If I have an opening, I will see her. Additional Follow-up by: Hart Carwin MD,  August 22, 2010 8:12 PM     Appended Document: triage Message left for patient to call back.Jesse Fall, RN 08/23/10 8:39 AM  Appended Document: triage Spoke with patient. No appointment available with Dr. Juanda Chance. Scheduled with [name], RNP    on 08/24/10 at 1:30 PM.

## 2010-09-13 NOTE — Procedures (Signed)
Summary: [facility] COLON   Colonoscopy  Procedure date:  06/26/2006  Findings:      Location:  [facility].   Patient Name: Alexis [name], Alexis [name]. MRN:  Procedure Procedures: Colonoscopy CPT: [phone].    with biopsy. CPT: Q5068410.  Personnel: Endoscopist: Dora L. Juanda Chance, MD.  Exam Location: Exam performed in Outpatient Clinic. Outpatient  Patient Consent: Procedure, Alternatives, Risks and Benefits discussed, consent obtained, from patient. Consent was obtained by the RN.  Indications Symptoms: Diarrhea Patient is having increased frequency of stools. Patient is having soft stools.  Surveillance of: 2000.  Comments: pt is a diabetic, diarrhea occurs postprandially, no weight loss History  Current Medications: Patient is not currently taking Coumadin.  Pre-Exam Physical: Performed Jun 26, 2006. Cardio-pulmonary exam, HEENT exam , Abdominal exam, Extremity exam, Neurological exam, Mental status exam WNL.  Comments: Pt. history reviewed/updated, physical exam performed prior to initiation of sedation?yes Exam Exam: Extent of exam reached: Cecum, extent intended: Cecum.  The cecum was identified by appendiceal orifice and IC valve. Colon retroflexion performed. Images taken. ASA Classification: II. Tolerance: fair, adequate exam.  Monitoring: Pulse and BP monitoring, Oximetry used. Supplemental O2 given.  Colon Prep Used Miralax for colon prep. Prep results: good.  Sedation Meds: Patient assessed and found to be appropriate for moderate (conscious) sedation. Fentanyl 50 mcg. given IV. Versed 5 mg. given IV.  Findings - DIAGNOSTIC TEST: Biopsies taken. from Ascending Colon to Descending Colon. Reason: r/o microscopic colitis.  - NORMAL EXAM: Cecum.  - NORMAL EXAM: to Rectum.    Comments: scope withdrawal time  6:55 min Assessment Normal examination.  Events  Unplanned Interventions: No intervention was required.  Unplanned Events: There were no  complications. Plans Medication Plan: Await pathology. Antispasmodics: Hyoscyamine PO .375 BID, starting Jun 26, 2006   Disposition: After procedure patient sent to recovery. After recovery patient sent home.   cc:  Adela Lank, MD  This report was created from the original endoscopy report, which was reviewed and signed by the above listed endoscopist.

## 2010-09-19 NOTE — Progress Notes (Signed)
Summary: Results   Phone Note Call from Patient Call back at Home Phone [phone]   Caller: Patient Call For: [name] Reason for Call: Talk to Nurse Summary of Call: Pt is calling to see if her results are back yet Initial call taken by: [place] Johnson,  September 13, 2010 11:57 AM  Follow-up for Phone Call        Patient had labs and would like results. Please, advise. Follow-up by: Jesse Fall RN,  September 13, 2010 3:12 PM  Additional Follow-up for Phone Call Additional follow up Details #1::        Rene Kocher, will you let Aubriana know that her liver tests were okay. She likely has fatty liver disease but didn't find any other problems with liver. She needs to keep follow up appt. Let's get LFTs a day or so before she comes for rov. Thanks Additional Follow-up by: [name] NP,  September 14, 2010 11:25 AM    Additional Follow-up for Phone Call Additional follow up Details #2::    Spoke with patient and gave her [name], RNP recommendations. Labs scheduled for 09/29/10. Follow-up by: Jesse Fall RN,  September 14, 2010 2:01 PM

## 2010-09-27 ENCOUNTER — Encounter (INDEPENDENT_AMBULATORY_CARE_PROVIDER_SITE_OTHER): Payer: Self-pay | Admitting: *Deleted

## 2010-09-27 ENCOUNTER — Other Ambulatory Visit: Payer: PRIVATE HEALTH INSURANCE

## 2010-09-27 ENCOUNTER — Other Ambulatory Visit: Payer: Self-pay | Admitting: Internal Medicine

## 2010-09-27 DIAGNOSIS — K7689 Other specified diseases of liver: Secondary | ICD-10-CM

## 2010-09-27 LAB — HEPATIC FUNCTION PANEL
ALT: 65 U/L — ABNORMAL HIGH (ref 0–35)
AST: 59 U/L — ABNORMAL HIGH (ref 0–37)
Albumin: 3.8 g/dL (ref 3.5–5.2)
Alkaline Phosphatase: 221 U/L — ABNORMAL HIGH (ref 39–117)
Bilirubin, Direct: 0.1 mg/dL (ref 0.0–0.3)
Total Bilirubin: 0.6 mg/dL (ref 0.3–1.2)
Total Protein: 6.9 g/dL (ref 6.0–8.3)

## 2010-10-01 ENCOUNTER — Ambulatory Visit (INDEPENDENT_AMBULATORY_CARE_PROVIDER_SITE_OTHER): Payer: PRIVATE HEALTH INSURANCE | Admitting: Internal Medicine

## 2010-10-01 ENCOUNTER — Encounter: Payer: Self-pay | Admitting: Internal Medicine

## 2010-10-01 DIAGNOSIS — R1013 Epigastric pain: Secondary | ICD-10-CM

## 2010-10-01 DIAGNOSIS — R7401 Elevation of levels of liver transaminase levels: Secondary | ICD-10-CM

## 2010-10-01 DIAGNOSIS — K7689 Other specified diseases of liver: Secondary | ICD-10-CM

## 2010-10-02 ENCOUNTER — Other Ambulatory Visit (AMBULATORY_SURGERY_CENTER): Payer: PRIVATE HEALTH INSURANCE | Admitting: Internal Medicine

## 2010-10-02 ENCOUNTER — Encounter: Payer: Self-pay | Admitting: Internal Medicine

## 2010-10-02 DIAGNOSIS — R198 Other specified symptoms and signs involving the digestive system and abdomen: Secondary | ICD-10-CM

## 2010-10-02 DIAGNOSIS — R1011 Right upper quadrant pain: Secondary | ICD-10-CM

## 2010-10-03 ENCOUNTER — Ambulatory Visit: Payer: Self-pay | Admitting: Internal Medicine

## 2010-10-09 NOTE — Assessment & Plan Note (Signed)
Summary: fu elevated LFT's   Vital Signs:  Patient profile:   60 year old female Height:      65 inches Weight:      185.38 pounds BMI:     30.96 BSA:     1.92 Pulse rate:   80 / minute Pulse rhythm:   regular BP sitting:   136 / 92  (left arm)  Vitals Entered By: Lamona Curl CMA Duncan Dull) (October 01, 2010 9:03 AM)  Visit Type:  Follow-up Visit Primary Care Provider:  Adela Lank, MD  Chief Complaint:  Patient here to follow up on lfts. She complains of right sided abdominal pain that is throbing mostly after eating. Marland Kitchen  History of Present Illness: 60 year old white female with chronic right upper quadrant abdominal pain, fatty liver and  irregular bowel habits with recent constipation and change in the caliber of her stools. Her recent liver function tests showed AST of 59 ALT of 65 with alkaline phosphatase of 221 and albumin  3.8. She has had intentional weight loss from 205 pounds to 185 pounds. Upper abdominal ultrasound in January 2012  post cholecystectomy state, and increased liver echogenicity consistent with fatty liver. Spleen was normal size. CT Scan of the  of the abdomen in 2007 showed decreased attenuation of the liver compared with the spleen consistent with fatty liver. Her antimitochondrial antibody, anti-smooth muscle antibody, alpha-1 antitrypsin, ceruloplasmin, ANA titer, ferritin and hepatitis B and C. serologies were negative. Upper endoscopy in November 2007 was normal. She has a family history of colon cancer in a maternal aunt and colon polyps in her father. Last colonoscopy in 2007 for diarrhea was normal. ERCP in 1997 to rule out common bile duct stone was normal.. History of uterine  cancer, diabetes, and dermatitis/vasculitis.  History of Present Illness Visit Type: Follow-up Visit Primary GI MD: Lina Sar MD Primary Provider: Adela Lank, MD Requesting Provider: n/a Chief Complaint: Patient here to follow up on lfts. She complains of right sided  abdominal pain that is throbing mostly after eating.   GI Review of Systems    Reports abdominal pain and  bloating.     Location of  Abdominal pain: right side.    Denies acid reflux, belching, chest pain, dysphagia with liquids, dysphagia with solids, heartburn, loss of appetite, nausea, vomiting, vomiting blood, weight loss, and  weight gain.        Denies anal fissure, black tarry stools, change in bowel habit, constipation, diarrhea, diverticulosis, fecal incontinence, heme positive stool, hemorrhoids, irritable bowel syndrome, jaundice, light color stool, liver problems, rectal bleeding, and  rectal pain.  Current Medications (verified): 1)  Wellbutrin Xl 300 Mg Xr24h-Tab (Bupropion Hcl) .... Take 1 Tablet By Mouth Once Daily 2)  Alprazolam 0.5 Mg Tabs (Alprazolam) .... Take 1 Tablet By Mouth Two Times A Day As Needed 3)  Nabumetone 500 Mg Tabs (Nabumetone) .... Take 1 Tablet By Mouth Four Time Daily As Needed 4)  Xylopan 5 Mg Tablet .... Take 1 Tablet By Mouth At Bedtime 5)  Tramadol Hcl 50 Mg Tabs (Tramadol Hcl) .... Take 1 Tablet By Mouth Four Times Daily As Needed 6)  Avapro 300 Mg Tabs (Irbesartan) .... Take 1 Tablet By Mouth Once A Day 7)  Toprol Xl 50 Mg Xr24h-Tab (Metoprolol Succinate) .... Take 1 Tablet By Mouth Once A Day 8)  Aciphex 20 Mg Tbec (Rabeprazole Sodium) .... Take 1 Tablet By Mouth Two Times A Day 9)  Multivitamins  Tabs (Multiple Vitamin) .... Take 1 Tablet  By Mouth Once A Day 10)  Fish Oil Double Strength 1200 Mg Caps (Omega-3 Fatty Acids) .... Take 1 Capsule By Mouth Once Daily 11)  Vitamin D3 2000 Unit Caps (Cholecalciferol) .... Take 1 Tablet By Mouth Once Daily 12)  Vitamin B-6 Cr 200 Mg Cr-Tabs (Pyridoxine Hcl) .... Take 1 Tablet By Mouth Once Daily 13)  Vitamin B12 (Unknown Dosage) Tablet .... Take 1 Tablet By Mouth Once A Day 14)  Effexor Xr 150 Mg Xr24h-Cap (Venlafaxine Hcl) .... Take 1 Tablet By Mouth Two Times A Day 15)  Bentyl 10 Mg Caps (Dicyclomine  Hcl) .... Take 1 Tab Twice Daily As Needed For Pain 16)  Naprosyn 500 Mg Tabs (Naproxen) .... Take 1 Tablet By Mouth Two Times A Day  Allergies (verified): 1)  ! Sulfa 2)  ! Codeine  Past History:  Past Medical History: Reviewed history from 08/24/2010 and no changes required. Current Problems:  GASTRITIS, HX OF (ICD-V12.79) GERD (ICD-530.81) IRRITABLE BOWEL SYNDROME (ICD-564.1) DEPRESSION (ICD-311) HYPERTENSION (ICD-401.9) HYPERLIPIDEMIA (ICD-272.4) SLEEP APNEA (ICD-780.57) DIABETES MELLITUS, TYPE II (ICD-250.00) FIBROMYALGIA (ICD-729.1) FATTY LIVER DISEASE (ICD-571.8) ARTHRITIS (ICD-716.90) ANXIETY (ICD-300.00) UTERINE CANCER, HX OF (ICD-V10.42)    Past Surgical History: Reviewed history from 09/27/2010 and no changes required. Appendectomy Hysterectomy Cholecystectomy Sinus Surgery Tubal Ligation Breast Lumpectomy  Family History: Reviewed history from 08/24/2010 and no changes required. Family History of Breast Cancer: sister Family History of Colon Cancer: Maternal Uncle Family History of Ovarian Cancer: Aunt Family History of Stomach Cancer: Aunt Family History of Colon Polyps: Father Family History of Diabetes: Aunt Family History of Heart Disease:  Father, Uncle, Grandparents Family History of Irritable Bowel Syndrome: Daughter, 1/2 brother  Social History: Occupation: Retired Patient has never smoked.  Alcohol Use - no Daily Caffeine Use one per day Illicit Drug Use - no  Review of Systems       The patient complains of back pain, fever, muscle pains/cramps, and sleeping problems.  The patient denies allergy/sinus, anemia, anxiety-new, arthritis/joint pain, blood in urine, breast changes/lumps, change in vision, confusion, cough, coughing up blood, depression-new, fainting, fatigue, headaches-new, hearing problems, heart murmur, heart rhythm changes, itching, menstrual pain, night sweats, nosebleeds, pregnancy symptoms, skin rash, sore throat, swelling  of feet/legs, swollen lymph glands, thirst - excessive , urination - excessive , urination changes/pain, urine leakage, vision changes, and voice change.         Pertinent positive and negative review of systems were noted in the above HPI. All other ROS was otherwise negative.   Physical Exam  General:  Well developed, well nourished, no acute distress. Eyes:  PERRLA, no icterus. Mouth:  No deformity or lesions, dentition normal. Neck:  Supple; no masses or thyromegaly. Lungs:  Clear throughout to auscultation. Heart:  Regular rate and rhythm; no murmurs, rubs,  or bruits. Abdomen:  tender right upper quadrant , tender liver. Liver edge at costal margin. Spleen are not enlarged. Normoactive bowel sounds. No bruits. Rectal:  heme negative stool Msk:  Symmetrical with no gross deformities. Normal posture. Extremities:  No clubbing, cyanosis, edema or deformities noted. Skin:  no stigmata of chronic liver disease Psych:  Alert and cooperative. Normal mood and affect.   Impression & Recommendations:  Problem # 1:  ABDOMINAL PAIN-RUQ (ICD-789.01) chronic right upper quadrant abdominal pain possibly related to fatty liver versus irritable bowel syndrome. The pain seemed to be relieved by antispasmodic Bentyl. There is  a strong family history of colon cancer and polyps. We will proceed with repeat colonoscopy. Continue  Bentyl 10 mg as needed.  Problem # 2:  FATTY LIVER DISEASE (ICD-571.8) fatty liver by imaging studies. There is  no evidence of portal hypertension or cirrhosis. Etiology not clear but likely fatty liver. Her synthetic function is normal; question whether she needs a liver biopsy. I will hold off on liver biopsy at this point since there is no effective treatment for fatty liver other than weight loss. and she has been losing weight.  Problem # 3:  GERD (ICD-530.81) status post upper endoscopy in 2005 10 2007 completely relieved with AcipHex 20 mg daily. She will continue on  the AcipHex  Patient Instructions: 1)  Continue weight-loss program. 2)  Hold off on liver biopsy at this time. 3)  You have been scheduled for a colonoscopy. Please follow written prep instructions that were given to you today at your visit.  4)  Please pick up your prescription for Moviprep at the pharmacy. An electronic presription has already been sent.  5)  Repeat liver function test every 6 months to assure stability. 6)  Please take Bentyl 10 mg as needed for colon spasm. 7)  The medication list was reviewed and reconciled.  All changed / newly prescribed medications were explained.  A complete medication list was provided to the patient / caregiver. Prescriptions: MOVIPREP 100 GM  SOLR (PEG-KCL-NACL-NASULF-NA ASC-C) As per prep instructions.  #1 x 0   Entered by:   Lamona Curl CMA (AAMA)   Authorized by:   Hart Carwin MD   Signed by:   Lamona Curl CMA (AAMA) on 10/01/2010   Method used:   Electronically to        [facility]  [address] [id number] (retail)       [address]       [place], [place]  [address]       Ph: [phone] or [phone]       Fax: [phone]   RxID:   [id number]   Appended Document: Orders Update    Clinical Lists Changes  Orders: Added new Test order of Colonoscopy (Colon) - Signed

## 2010-10-09 NOTE — Letter (Signed)
Summary: Alexis [name] Instructions  [facility]  [address] [place], [place] [address]   Phone: [phone]  Fax: [phone]       JEANNENE TSCHETTER    10/10/1959    MRN: [id number]        Procedure Day /Date: Tuesday 10/02/10     Arrival Time: 7:30 am     Procedure Time: 8:30 am     Location of Procedure:                    _x _  [facility] (4th Floor)  PREPARATION FOR COLONOSCOPY WITH MOVIPREP    THE DAY BEFORE YOUR PROCEDURE         DATE: 10/01/10  DAY:  Monday  1.  Drink clear liquids the entire day-NO SOLID FOOD  2.  Do not drink anything colored red or purple.  Avoid juices with pulp.  No orange juice.  3.  Drink at least 64 oz. (8 glasses) of fluid/clear liquids during the day to prevent dehydration and help the prep work efficiently.  CLEAR LIQUIDS INCLUDE: Water Jello Ice Popsicles Tea (sugar ok, no milk/cream) Powdered fruit flavored drinks Coffee (sugar ok, no milk/cream) Gatorade Juice: apple, white grape, white cranberry  Lemonade Clear bullion, consomm, broth Carbonated beverages (any kind) Strained chicken noodle soup Hard Candy                             4.  In the morning, mix first dose of MoviPrep solution:    Empty 1 Pouch A and 1 Pouch B into the disposable container    Add lukewarm drinking water to the top line of the container. Mix to dissolve    Refrigerate (mixed solution should be used within 24 hrs)  5.  Begin drinking the prep at 5:00 p.m. The MoviPrep container is divided by 4 marks.   Every 15 minutes drink the solution down to the next mark (approximately 8 oz) until the full liter is complete.   6.  Follow completed prep with 16 oz of clear liquid of your choice (Nothing red or purple).  Continue to drink clear liquids until bedtime.  7.  Before going to bed, mix second dose of MoviPrep solution:    Empty 1 Pouch A and 1 Pouch B into the disposable container    Add lukewarm drinking water to  the top line of the container. Mix to dissolve    Refrigerate  THE DAY OF YOUR PROCEDURE      DATE: 10/02/10 DAY: Tuesday  Beginning at 3:30 a.m. (5 hours before procedure):         1. Every 15 minutes, drink the solution down to the next mark (approx 8 oz) until the full liter is complete.  2. Follow completed prep with 16 oz. of clear liquid of your choice.    3. You may drink clear liquids until 6:30 am (2 HOURS BEFORE PROCEDURE).   MEDICATION INSTRUCTIONS  Unless otherwise instructed, you should take regular prescription medications with a small sip of water   as early as possible the morning of your procedure.        OTHER INSTRUCTIONS  You will need a responsible adult at least 59 years of age to accompany you and drive you home.   This person must remain in the waiting room during your procedure.  Wear loose fitting clothing that is easily removed.  Leave jewelry  and other valuables at home.  However, you may wish to bring a book to read or  an iPod/MP3 player to listen to music as you wait for your procedure to start.  Remove all body piercing jewelry and leave at home.  Total time from sign-in until discharge is approximately 2-3 hours.  You should go home directly after your procedure and rest.  You can resume normal activities the  day after your procedure.  The day of your procedure you should not:   Drive   Make legal decisions   Operate machinery   Drink alcohol   Return to work  You will receive specific instructions about eating, activities and medications before you leave.    The above instructions have been reviewed and explained to me by   Lamona Curl CMA Duncan Dull)  October 01, 2010 9:47 AM    I fully understand and can verbalize these instructions _____________________________ Date _________

## 2010-10-09 NOTE — Procedures (Signed)
Summary: Colonoscopy  Patient: [name] Note: All result statuses are Final unless otherwise noted.  Tests: (1) Colonoscopy (COL)   COL Colonoscopy           DONE     [facility]     [address]     [place], [place]  [address]           COLONOSCOPY PROCEDURE REPORT           PATIENT:  Alexis [name], Alexis [name]  MR#:  [id number]     BIRTHDATE:  23-Jul-1951, 60 yrs. old  GENDER:  female     ENDOSCOPIST:  Hedwig Morton. Juanda Chance, MD     REF. BY:  Adela Lank, M.D.     PROCEDURE DATE:  10/02/2010     PROCEDURE:  Colonoscopy [id number]     ASA CLASS:  Class II     INDICATIONS:  Abdominal pain RUQ abd. pain, change in bowl habite           last colon 2007     Mat aunt with colon cancer     MEDICATIONS:   Versed 9 mg, Fentanyl 100 mcg           DESCRIPTION OF PROCEDURE:   After the risks benefits and     alternatives of the procedure were thoroughly explained, informed     consent was obtained.  Digital rectal exam was performed and     revealed no rectal masses.   The LB [id number]-[id number] endoscope     was introduced through the anus and advanced to the cecum, which     was identified by both the appendix and ileocecal valve, without     limitations.  The quality of the prep was good, using MoviPrep.     The instrument was then slowly withdrawn as the colon was fully     examined.     <<PROCEDUREIMAGES>>           FINDINGS:  No polyps or cancers were seen (see image1, image2,     image3, image5, image6, and image4).   Retroflexed views in the     rectum revealed no abnormalities.    The scope was then withdrawn     from the patient and the procedure completed.           COMPLICATIONS:  None     ENDOSCOPIC IMPRESSION:     1) No polyps or cancers     2) Normal colonoscopy     RECOMMENDATIONS:     RUQ abd. pain possibly caused by hepatomegaly/fatty liver,     REPEAT EXAM:  In 10 year(s) for.           ______________________________     Hedwig Morton. Juanda Chance, MD           CC:           n.     eSIGNED:   Hedwig Morton. Melane Windholz at 10/02/2010 09:17 AM           Pete Glatter, [id number]  Note: An exclamation mark (!) indicates a result that was not dispersed into the flowsheet. Document Creation Date: 10/02/2010 9:17 AM _______________________________________________________________________  (1) Order result status: Final Collection or observation date-time: 10/02/2010 09:09 Requested date-time:  Receipt date-time:  Reported date-time:  Referring Physician:   Ordering Physician: Lina Sar [phone]) Specimen Source:  Source: Launa Grill Order Number: [id number] Lab site:   Appended Document: Colonoscopy    Clinical Lists Changes  Observations: Added  new observation of COLONNXTDUE: 09/2020 (10/02/2010 15:38)

## 2010-11-15 ENCOUNTER — Telehealth: Payer: Self-pay | Admitting: Internal Medicine

## 2010-11-15 NOTE — Telephone Encounter (Signed)
I agree with Mir lax 1/2 capful qd x3, also start Bentyl 10 mg, #20 1 po bid x 10 days., norefill

## 2010-11-15 NOTE — Telephone Encounter (Signed)
Calling to report in the last 1 1/2 weeks, her bowel movements have been penciled shaped then 4 inch pieces, then ring shaped and now is little peebles. She is having some nausea and "little chill". She is having pain under her breast above her waist like she did before the colonoscopy on 10/02/10. States she does not have any hunger pains and is eating very little but states she is drinking lots of fluids. Feels like she has a 'spasm" after eating. States she feels pressure like she needs to go to the bathroom. She has tried Metamucil. Suggested she try Miralax but she wants to know what Dr. Juanda Chance recommends. Please, advise.

## 2010-11-18 LAB — DIFFERENTIAL
Basophils Absolute: 0 10*3/uL (ref 0.0–0.1)
Basophils Relative: 1 % (ref 0–1)
Eosinophils Absolute: 0 10*3/uL (ref 0.0–0.7)
Eosinophils Relative: 0 % (ref 0–5)
Lymphocytes Relative: 29 % (ref 12–46)
Lymphs Abs: 2.3 10*3/uL (ref 0.7–4.0)
Monocytes Absolute: 0.8 10*3/uL (ref 0.1–1.0)
Monocytes Relative: 11 % (ref 3–12)
Neutro Abs: 4.7 10*3/uL (ref 1.7–7.7)
Neutrophils Relative %: 60 % (ref 43–77)

## 2010-11-18 LAB — POCT CARDIAC MARKERS
CKMB, poc: <1 ng/mL — ABNORMAL LOW (ref 1.0–8.0)
CKMB, poc: <1 ng/mL — ABNORMAL LOW (ref 1.0–8.0)
Myoglobin, poc: 32.4 ng/mL (ref 12–200)
Myoglobin, poc: 49.5 ng/mL (ref 12–200)
Troponin i, poc: <0.05 ng/mL (ref 0.00–0.09)
Troponin i, poc: <0.05 ng/mL (ref 0.00–0.09)

## 2010-11-18 LAB — COMPREHENSIVE METABOLIC PANEL
ALT: 44 U/L — ABNORMAL HIGH (ref 0–35)
AST: 32 U/L (ref 0–37)
Albumin: 3.5 g/dL (ref 3.5–5.2)
Alkaline Phosphatase: 143 U/L — ABNORMAL HIGH (ref 39–117)
BUN: 15 mg/dL (ref 6–23)
CO2: 27 mEq/L (ref 19–32)
Calcium: 9.2 mg/dL (ref 8.4–10.5)
Chloride: 103 mEq/L (ref 96–112)
Creatinine, Ser: 0.61 mg/dL (ref 0.4–1.2)
GFR calc Af Amer: >60 mL/min (ref >60)
GFR calc non Af Amer: >60 mL/min (ref >60)
Glucose, Bld: 305 mg/dL — ABNORMAL HIGH (ref 70–99)
Potassium: 3.9 mEq/L (ref 3.5–5.1)
Sodium: 139 mEq/L (ref 135–145)
Total Bilirubin: 0.7 mg/dL (ref 0.3–1.2)
Total Protein: 6.8 g/dL (ref 6.0–8.3)

## 2010-11-18 LAB — CBC
HCT: 39.4 % (ref 36.0–46.0)
Hemoglobin: 13.6 g/dL (ref 12.0–15.0)
MCHC: 34.4 g/dL (ref 30.0–36.0)
MCV: 90.2 fL (ref 78.0–100.0)
Platelets: 208 10*3/uL (ref 150–400)
RBC: 4.36 MIL/uL (ref 3.87–5.11)
RDW: 12.9 % (ref 11.5–15.5)
WBC: 7.9 10*3/uL (ref 4.0–10.5)

## 2010-11-18 LAB — LIPASE, BLOOD: Lipase: 36 U/L (ref 11–59)

## 2010-11-19 MED ORDER — DICYCLOMINE HCL 10 MG PO CAPS: ORAL_CAPSULE | ORAL | Status: DC

## 2010-11-19 NOTE — Telephone Encounter (Signed)
I have left a message for the pt with Dr Regino Schultze recommendations and orders sent to pharmacy.

## 2010-12-28 NOTE — Op Note (Signed)
[place]  Patient:    BRANDALYNN, OFALLON                    MRN: [id number] Proc. Date: 02/17/01 Adm. Date:  [id number] Attending:  [name]                           Operative Report  PREOPERATIVE DIAGNOSIS:  Entrapment neuropathy, median nerve, left carpal tunnel.  POSTOPERATIVE DIAGNOSIS:  Entrapment neuropathy, median nerve, left carpal tunnel.  PROCEDURE:  Release of left transverse carpal ligament.  SURGEON:  Katy Fitch. Sypher, Montez Hageman., M.D.  ASSISTANT:  Jonni Sanger, P.A.  ANESTHESIA:  General by LMA, supervised by the anesthesiologist, Guadalupe Maple, M.D.  INDICATIONS:  Julee Stoll is a 60 year old woman who has had bilateral hand pain and numbness.  Clinical examination revealed carpal tunnel syndrome. Electrodiagnostic studies confirmed median neuropathy.  Due to a failure to respond to nonoperative measures, she is brought to the operating room at this time for release of her left transverse carpal ligament.  DESCRIPTION OF PROCEDURE:  Nishita Isaacks was brought to the operating room and placed in the supine position on the operating table.  Following induction of general anesthesia by LMA, the left arm was prepped with Betadine soap and solution and sterilely draped.  Following exsanguination of the limb with an Esmarch bandage, arterial tourniquet was inflated to 220 mmHg.  The procedure commenced with a short incision in the line of the ring finger in the palm.  Subcutaneous tissues were carefully divided, revealing the palmar fascia.  This was split longitudinally to reveal the common sensory branch of the median nerve.  These were followed back to the transverse carpal ligament, which was carefully isolated from the median nerve.  The ligament was released with scissors on its ulnar border, extending into the distal forearm.  This widely opened the carpal canal.  No masses or  other predicaments were noted.  Bleeding points were electrocauterized with bipolar current and followed by repair of the skin with intradermal 3-0 Prolene suture.  A compressive dressing was applied, a splint maintaining the wrist in 5 degrees of dorsiflexion. DD:  02/17/01 TD:  02/17/01 Job: [id number]

## 2010-12-28 NOTE — Assessment & Plan Note (Signed)
[facility]                                   ON-CALL NOTE   Alexis, [name]                    MRN:          [id number]  DATE:05/11/2006                            DOB:          03/19/1951    Alexis [name] called the answering service and I returned her call at  approximately 1340 hours.  She is a patient of Dr. Verlee Monte Brodie's.   She has been having abdominal pain and back pain for several weeks.  Was  advised to go to the emergency department by Dr. Arlyce Dice and Alexis [name]  last week and on September 27, I can see where she saw Dr. Stacie Acres and had  back pain issues and had labs that showed a normal CBC, CMET okay except for  an AST of 83 and an ALT of 61.  Normal EKG except for some non-specific ST  changes.  CK-MB negative. Troponin negative.  She had been complaining of  diarrhea.  She was advised to take some Lomotil.  Now she is telling me the  pain is worse and intense and making it hard to breath.  The pain in the  upper abdomen and in the back radiates around to the back.  Her past medical  history is notable for appendectomy, cholecystectomy, hysterectomy, for  uterine cancer.  She apparently also has some sort of lesion on her kidney  that is followed by a urologist outside of town here.  She is allergic to  CODEINE and SULFA.  She has not had vomiting.  There may have been some  nausea.   Because of the progressive of symptoms and the problems I advised her to go  to the emergency department for further evaluation, as it is Sunday and we  cannot see her in the office.  Further evaluation can be undertaken there.  The notes from Dr. Stacie Acres indicate she is on 20 medications, I do not have  her whole list.  She also has a history of diabetes and fibromyalgia and  hypertension, panic attacks and irregular heart rate.  She has had upper  endoscopies and colonoscopies by Dr. Juanda Chance in the past.       Alexis [name], Alexis [name],FACG      CEG/MedQ  DD:  05/11/2006  DT:  05/12/2006  Job #:  [id number]   cc:   Hedwig Morton. Juanda Chance, Alexis [name]  Brooke Bonito, M.D.

## 2010-12-28 NOTE — Assessment & Plan Note (Signed)
[facility]                           GASTROENTEROLOGY OFFICE NOTE   NAME:Alexis [name], Alexis [name]                    MRN:          [id number]  DATE:05/08/2006                            DOB:          1951-04-16    PROBLEM:  Acute work-in for nausea, vomiting, diarrhea, fever, and abdominal  discomfort.   HISTORY:  Alexis [name] is a pleasant 60 year old white female, primary patient of  Dr. Marylen Ponto known to Dr. Lina Sar, who has a history of IBS and GERD.  She is also diabetic, has a history of uterine cancer status post surgery in  2000, fibromyalgia, chronic pain syndrome, sleep apnea.  She is also status  post cholecystectomy and has a history of a cardiac arrhythmia.  Sh comes in  today with complaints of a 2 week history of illness, which initially  started with urinary tract symptoms with urgency and pressure, but feeling  of inability to empty her bladder.  She said this lasted for a couple of  days and then went away for a few days.  By the end of that week, symptoms  were back and were associated with some right flank and right back pain.  She started having fevers off and on and then, this past weekend, had  fevers, clamminess, nausea, and right-sided abdominal discomfort.  She had  seen a urologist, Dr. Lindley Magnus in [facility] on Monday, was told that she  probably had a bladder infection, was started on a 3 day course of Cipro,  and apparently cultures were done.  We do not have those results.  She has  finished her Cipro.  She says now, over the past 3 or 4 days, she has been  unable to eat, has been having some diarrhea.  Her abdomen feels distended  and swollen. She has had some abdominal cramping.  Says that she does not  have diarrhea as long as she does not eat, but as soon as she eats anything  of substance, she will have immediate diarrhea.  Sh has not noted any melena  or hematochezia.  Sh continues to be febrile with a temperature of 102  at  home last evening.   CURRENT MEDICATIONS:  1. Actos 30 mg daily.  2. Calcium twice daily.  3. Multivitamin daily.  4. Avapro 300 daily.  5. Allegra 1 daily.  6. Toprol XL 50 daily.  7. Wellbutrin XL 300 daily.  8. Effexor XR 150 b.i.d.  9. Nabumetone which is Relafen 500 mg twice a day.  10.Sonata 10 mg nightly.  11.Alprazolam 0.5 p.r.n.  12.Metformin 500 b.i.d.   EXAM:  Well-developed white female, ill-appearing, no acute distress.  Weight is 205, temperature 100.9, blood pressure 120/70, pulse is in the  80s.  CARDIOVASCULAR:  Regular rate and rhythm with S1 and S2.  PULMONARY:  Clear to A and P.  ABDOMEN:  Slightly distended, quiet.  Sh is tender in the right abdomen and  right flank.  There is no definite CVA tenderness.  There is no guarding or  rebound.  RECTAL:  Was not done today.  HEENT:  Buccal mucosa is  slightly dry.   IMPRESSION:  42. A 60 year old female diabetic with a 2 week illness.  Suspect she may      have pyelonephritis and that her gastrointestinal symptoms are      secondary to underlying infection.  2. Rule out ileus secondary to above.   PLAN:  The patient is to be admitted.  We have contacted Dr. Juleen China, who  advises that she be sent to the [facility] emergency room for evaluation  and admission by the [facility].      ______________________________  Mike Gip, PA-C    ______________________________  Barbette Hair. Arlyce Dice, MD,FACG     AE/MedQ  DD:  05/09/2006  DT:  05/11/2006  Job #:  [id number]   cc:   Brooke Bonito, M.D.

## 2010-12-28 NOTE — Assessment & Plan Note (Signed)
[facility]                           GASTROENTEROLOGY OFFICE NOTE   Alexis [name], Alexis [name]                    MRN:          [id number]  DATE:05/19/2006                            DOB:          [birth year]    Alexis [name] is a 60 year old white female diabetic, whom we have followed  in the past for IBS/gastroesophageal reflux disease.  She also has history  of uterine cancer, status post hysterectomy in 2000.  She had a remote  cholecystectomy in 1990.  Her right upper quadrant abdominal discomfort  resembles pain during her gallbladder attacks.  She was recently  hospitalized at [facility] for 2 days with acute nausea, vomiting and  diarrhea.  The etiology of this acute decompensation was most likely viral.  She was discharged after hydration.  She still has nausea, reflux symptoms,  in spite of taking Aciphex 20 mg a day, and right upper quadrant tenderness  and pain which radiates to the right scapula.   MEDICATIONS:  1. Aciphex 20 mg p.o. daily.  2. Nabumetone 500 mg p.o. b.i.d.  3. Sonata 1 nightly.  4. Avapro 300 mg p.o. daily.  5. __________ 50 mcg p.o. daily.  6. Alprazolam 1 mg p.o. nightly.  7. Wellbutrin XL 300 mg daily.  8. Trileptal 150 mg p.o. b.i.d.  9. Allegra 1 daily.  10.B6.  11.Multiple vitamins.  12.Fish oil.  13.Ultracet 1 p.o. q.4 hours p.r.n. pain.  14.Effexor 150 mg p.o. b.i.d.   PHYSICAL EXAMINATION:  VITAL SIGNS:  Blood pressure 126/64, pulse 68.  Weight not recorded.  Last weight was 205 pounds.  GENERAL:  She was alert, oriented, in no distress.  Pain described in right  upper quadrant has improved 80% since initial evaluation several weeks ago.  The patient complains of persistent tenderness in the right upper quadrant  radiating to her back.  LUNGS:  Clear to auscultation.  COR:  Normal S1, normal S2.  ABDOMEN:  Protuberant, large but tender as described in my history of  present illness.  Lower abdomen  was normal.  RECTAL:  Hemoccult-negative stool.   IMPRESSION:  36. A 60 year old white female with acute gastrointestinal episode      involving upper and lower gastrointestinal tract, likely viral in      origin.  2.  She has a history of gastroesophageal reflux and CLOtest      negative gastritis.  2. Diarrhea.  The patient had a colonoscopy in September 2000 which was      normal.   PLAN:  1. Upper endoscopy and colonoscopy scheduled.  2. Increase Aciphex to 40 mg p.o. daily.  3. Stay on low fat diet.  4. Consider liver tests and other hepatic evaluation.       Hedwig Morton. Juanda Chance, MD      DMB/MedQ  DD:  05/19/2006  DT:  05/21/2006  Job #:  [id number]   cc:   Brooke Bonito, M.D.

## 2010-12-28 NOTE — Op Note (Signed)
[place]  Patient:    Alexis [name], Alexis [name]                    MRN: [id number] Proc. Date: 01/06/01 Adm. Date:  [id number] Attending:  Sypher, Douglass Rivers CC:         Katy Fitch. Naaman Plummer., M.D. (2)  Lemmie Evens, M.D.   Operative Report  PREOPERATIVE DIAGNOSES:  Entrapment neuropathy median nerve, right carpal tunnel.  POSTOPERATIVE DIAGNOSES:  Entrapment neuropathy median nerve, right carpal tunnel.  OPERATION:  Release of right transverse carpal ligament.  SURGEON:  Katy Fitch. Sypher, Montez Hageman., M.D.  ASSISTANT:  Marveen Reeks Dasnoit, P.A.-C.  ANESTHESIA:  General by mask.  ANESTHESIOLOGIST:  Janetta Hora. Gelene Mink, M.D.  INDICATIONS:  Alexis [name] is a 60 year old woman employed by the [employer].  She has a history of type 2 diabetes mellitus.  She has developed pain and numbness in her hands, consistent with carpal tunnel syndrome.  Due to a failure to respond in nonoperative measures, she is brought to the operating room at this time for a release of her right transverse carpal ligament.  DESCRIPTION OF PROCEDURE:  Alexis [name] is brought to the operating room and placed in the supine position on the operating room table.  Following the induction of general anesthesia by mask, the right arm was prepped with Betadine soap and solution and sterilely draped.  Following the exsanguination of the limb with an Esmarch bandage, the tourniquet was inflated to 250 mmHg due to mild systolic hypertension.  The procedure commenced with a short incision in line with the ring finger in the palm.  The subcutaneous tissues were carefully divided revealing the palmar fascia.  This was split longitudinally exposing the competent branch of the median nerve.  This was followed back to the transverse carpal ligament which was carefully isolated from the median nerve.  The ligament was then released on its ulnar  border, extending into the distal forearm.  This widely opened the carpal canal.  No masses or other predicaments were noted.  Bleeding points along the margin of the released ligament were electrocauterized with bipolar current, followed by a repair of the skin with intradermal #3-0 Prolene suture.  A compressive dressing was applied with a volar plaster splint in attempt to provide wrist dorsiflexion. DD:  01/06/01 TD:  01/06/01 Job: [id number]

## 2011-01-03 ENCOUNTER — Telehealth: Payer: Self-pay | Admitting: Internal Medicine

## 2011-01-03 MED ORDER — PROMETHAZINE HCL 25 MG PO TABS: 25.0000 mg | ORAL_TABLET | Freq: Four times a day (QID) | ORAL | Status: DC | PRN

## 2011-01-03 NOTE — Telephone Encounter (Signed)
Please take PPI bid x 1 week ( she was on Aciphex), also Phenergan 25 mg, #10, 1 po q 6 hrs prn nausea, if no better, schedule EGD ( last one 2007)>

## 2011-01-03 NOTE — Telephone Encounter (Signed)
Patient calling to report for the last 2 days, she has had left sided abdominal pain above her belly button. Describes the pain as an ache. When she is sitting, it hurts into her back. Also, c/o bloating. States she has had nausea without vomiting for one week. She did see Dr. Juleen China yesterday and had labs drawn. He told her the labs were good except her "liver was elevated." Bowel movement are normal. Denies fever, vomiting or bleeding. She is taking Tramadol and Napersyn for chronic back problems and Bentyl without relief. Last colon- 10/02/10- hx hepatomegaly, fatty liver, IBS, GERD.

## 2011-01-03 NOTE — Telephone Encounter (Signed)
Patient states she is taking Aciphex BID. Phenergan rx sent to pharmacy. Patient understands to call back if not better.

## 2011-02-04 ENCOUNTER — Encounter: Payer: PRIVATE HEALTH INSURANCE | Attending: Endocrinology | Admitting: *Deleted

## 2011-02-04 DIAGNOSIS — E119 Type 2 diabetes mellitus without complications: Secondary | ICD-10-CM | POA: Insufficient documentation

## 2011-02-04 DIAGNOSIS — Z713 Dietary counseling and surveillance: Secondary | ICD-10-CM | POA: Insufficient documentation

## 2011-09-20 ENCOUNTER — Other Ambulatory Visit: Payer: Self-pay | Admitting: Gynecology

## 2012-02-19 ENCOUNTER — Telehealth: Payer: Self-pay | Admitting: Internal Medicine

## 2012-02-19 NOTE — Telephone Encounter (Signed)
Pt states she is having pain in the middle of her breast that goes down her waist line all the way around her back to her spine on the right side. When she eats of drinks the pain gets worse and it stays with her all day. When she bends the the left side it hurts more. Pt has an appt today at 1:30pm with her diabetic doctor. Pt will have her doctor call if she needs some assistance dealing with GI.

## 2012-02-20 ENCOUNTER — Telehealth: Payer: Self-pay | Admitting: Internal Medicine

## 2012-02-20 NOTE — Telephone Encounter (Signed)
Pt saw her PCP yesterday and he did not find anything to explain her pain, had slightly elevated LFT's. Pt saw Dr. Daiva Huge. Pt requesting to be seen tomorrow. Pt notified that she could not be seen Friday. Offered pt an appt for 02/24/12 @10am  with [name] NP. Pt will come for that appt.

## 2012-02-21 ENCOUNTER — Encounter: Payer: Self-pay | Admitting: *Deleted

## 2012-02-21 NOTE — Telephone Encounter (Signed)
Called Dr. Marylen Ponto office for records but they close at 12noon on Friday. Will call back Monday.

## 2012-02-24 ENCOUNTER — Encounter: Payer: Self-pay | Admitting: Nurse Practitioner

## 2012-02-24 ENCOUNTER — Ambulatory Visit (INDEPENDENT_AMBULATORY_CARE_PROVIDER_SITE_OTHER): Payer: PRIVATE HEALTH INSURANCE | Admitting: Nurse Practitioner

## 2012-02-24 VITALS — BP 154/92 | HR 72 | Ht 65.0 in | Wt 173.8 lb

## 2012-02-24 DIAGNOSIS — R194 Change in bowel habit: Secondary | ICD-10-CM | POA: Insufficient documentation

## 2012-02-24 DIAGNOSIS — R1011 Right upper quadrant pain: Secondary | ICD-10-CM

## 2012-02-24 DIAGNOSIS — K7689 Other specified diseases of liver: Secondary | ICD-10-CM

## 2012-02-24 DIAGNOSIS — K589 Irritable bowel syndrome without diarrhea: Secondary | ICD-10-CM

## 2012-02-24 DIAGNOSIS — K5900 Constipation, unspecified: Secondary | ICD-10-CM

## 2012-02-24 MED ORDER — LINACLOTIDE 290 MCG PO CAPS: 1.0000 | ORAL_CAPSULE | Freq: Every day | ORAL | Status: DC

## 2012-02-24 MED ORDER — ALIGN PO CAPS: 1.0000 | ORAL_CAPSULE | Freq: Every day | ORAL | Status: DC

## 2012-02-24 MED ORDER — HYOSCYAMINE SULFATE 0.125 MG SL SUBL: SUBLINGUAL_TABLET | SUBLINGUAL | Status: DC

## 2012-02-24 NOTE — Progress Notes (Signed)
Alexis [name] [id number] February 22, 1951   HISTORY OR PRESENT ILLNESS :  Patient is a 61 year old female known to Dr. Juanda Chance for a history of chronic right-sided abdominal pain, fatty liver disease and GERD. Patient had a colonoscopy February 2012 for a family history of colon cancer. No polyps were seen. The exam was normal.    Alexis [name] is referred by PCP for evaluation of RUQ pain, different than the RUQ pain she has had in the past.  This "grabbing" right upper quadrant pain started about 4 weeks ago and has been associated with nausea and constipation. Pain feels like previous gallbladder attacks.  The pain sometimes radiates around the right side into her back. Patient saw PCP 02/19/12, CBC was normal  CMET was normal except for glucose of 364 and an ALT of 48. Patient given samples of Linzess and since taking it her bowel movements have slowly begun to normalize and her right sided abdominal pain has improved. Patient has taken On BID Relafen for years. Takes Aciphex BID.  Current Medications, Allergies, Past Medical History, Past Surgical History, Family History and Social History were reviewed in [employer] record.    03/05/2009 17:24 08/24/2010 09:24 09/27/2010 16:41  Alkaline Phosphatase 143 (H) 119 (H) 221 (H)  Albumin 3.5 3.8 3.8  Lipase 36    AST 32 52 (H) 59 (H)  ALT 44 (H) 68 (H) 65 (H)   PHYSICAL EXAMINATION : General:  Well developed  female in no acute distress Head: Normocephalic and atraumatic Eyes:  sclerae anicteric,conjunctive pink. Ears: Normal auditory acuity Neck: Supple, no masses.  Lungs: Clear throughout to auscultation Heart: Regular rate and rhythm Abdomen: Soft, nondistended, nontender. No masses noted. Normal bowel sounds Musculoskeletal: Symmetrical with no gross deformities  Skin: No lesions on visible extremities Extremities: No edema or deformities noted Neurological: Oriented x 4, grossly nonfocal Cervical Nodes:  No  significant cervical adenopathy Psychological:  Alert and cooperative. Normal mood and affect  ASSESSMENT AND PLAN :  1. RUQ pain, nausea, constipation. Abdominal pain and nausea have improved with resolution of constipation after starting Linzess. Suspect pain was secondary to constipation. She had a normal colonoscopy in February 2012.  Liver function studies done a few days ago were unremarkable except for mildly elevated ALT, see #2. Since patient is feeling better no further workup is indicated. She plans to Linzess on an as-needed basis, samples given. I also gave her samples of Align to take over the next 14 days. She may try sublingual Levsin as needed for abdominal pain.   2. Fatty liver disease, her most recent liver function studies are actually better than previous ones. Discussed fatty liver disease in terms of things patient can do to help (weight loss, good glycemic control, maintaining good lipid levels). Return for routine followup with Dr. Juanda Chance in a couple of months

## 2012-02-24 NOTE — Patient Instructions (Addendum)
We sent a prescription for Levsin for cramping and spasms to [facility] [address]. We have given you samples of Align probiotic to take 1 capsule by mouth daily for 14 days. We also gave you the high dose of Linzess samples, 290 mcg to take 1 daily.   We made you a follow up appointment with Dr. Lina Sar for 04-22-2012 at 8:15 AM.

## 2012-02-26 NOTE — Progress Notes (Signed)
If abdominal pain recurs, recommend repeat ultrasound.  Reviewed and agree with management. Barbette Hair. Arlyce Dice, M.D., [facility]

## 2012-04-01 ENCOUNTER — Encounter: Payer: Self-pay | Admitting: *Deleted

## 2012-04-09 ENCOUNTER — Other Ambulatory Visit: Payer: Self-pay | Admitting: Gynecology

## 2012-04-09 DIAGNOSIS — N6311 Unspecified lump in the right breast, upper outer quadrant: Secondary | ICD-10-CM

## 2012-04-09 DIAGNOSIS — N644 Mastodynia: Secondary | ICD-10-CM

## 2012-04-09 DIAGNOSIS — N6313 Unspecified lump in the right breast, lower outer quadrant: Secondary | ICD-10-CM

## 2012-04-14 ENCOUNTER — Ambulatory Visit
Admission: RE | Admit: 2012-04-14 | Discharge: 2012-04-14 | Disposition: A | Discharge status: Home / Self Care | Payer: 59 | Source: Ambulatory Visit | Attending: Gynecology | Admitting: Gynecology

## 2012-04-14 DIAGNOSIS — N6311 Unspecified lump in the right breast, upper outer quadrant: Secondary | ICD-10-CM

## 2012-04-14 DIAGNOSIS — N644 Mastodynia: Secondary | ICD-10-CM

## 2012-04-14 DIAGNOSIS — N6313 Unspecified lump in the right breast, lower outer quadrant: Secondary | ICD-10-CM

## 2012-04-22 ENCOUNTER — Encounter: Payer: Self-pay | Admitting: Internal Medicine

## 2012-04-22 ENCOUNTER — Ambulatory Visit (INDEPENDENT_AMBULATORY_CARE_PROVIDER_SITE_OTHER): Payer: 59 | Admitting: Internal Medicine

## 2012-04-22 ENCOUNTER — Other Ambulatory Visit: Payer: 59

## 2012-04-22 VITALS — BP 140/82 | HR 68 | Ht 65.0 in | Wt 179.0 lb

## 2012-04-22 DIAGNOSIS — R1013 Epigastric pain: Secondary | ICD-10-CM

## 2012-04-22 LAB — HEPATITIS C ANTIBODY: HCV Ab: NEGATIVE

## 2012-04-22 LAB — HEPATITIS B SURFACE ANTIBODY,QUALITATIVE: Hep B S Ab: NEGATIVE

## 2012-04-22 LAB — HEPATITIS B SURFACE ANTIGEN: Hepatitis B Surface Ag: NEGATIVE

## 2012-04-22 MED ORDER — RABEPRAZOLE SODIUM 20 MG PO TBEC: 20.0000 mg | DELAYED_RELEASE_TABLET | Freq: Two times a day (BID) | ORAL | Status: DC

## 2012-04-22 MED ORDER — SUCRALFATE 1 GM/10ML PO SUSP: 1.0000 g | Freq: Two times a day (BID) | ORAL | Status: DC

## 2012-04-22 NOTE — Patient Instructions (Addendum)
You have been scheduled for an endoscopy with propofol. Please follow written instructions given to you at your visit today. If you use inhalers (even only as needed), please bring them with you on the day of your procedure. We have sent the following medications to your pharmacy for you to pick up at your convenience: Carafate Aciphex Your physician has requested that you go to the basement for the following lab work before leaving today: Ceruloplasmin, Hepatitis C serologies, Hepatitis B serologies, ANA, AMA, IgG, IgM CC: Dr Juleen China

## 2012-04-22 NOTE — Progress Notes (Signed)
Alexis [name] 1951/07/22 MRN [id number]    History of Present Illness:  This is a 61 year old white female with chronic constipation and postprandial nausea as well as epigastric discomfort of several months duration. She has been on Naprosyn 500 mg twice a day. She has known fatty liver and abnormal liver function tests. Her last ultrasound of the liver in January 2012 confirmed fatty liver. She had a normal common bile duct at 6 mm and is status post cholecystectomy since 1990. Her spleen was 6 cm. She has a family history of colon cancer and her last colonoscopy in February 2012 was normal. Prior colonoscopies were in 2000 and 2007. She is on AcipHex 20 mg twice a day, Levsin, and probiotics. She tends to be constipated, especially since she has been on a weight reduction diet. She saw [name], NP on 02/24/2012 for right upper quadrant abdominal pain and attacks similar to her gallbladder attacks. Her ALT was 48 and her AST was normal. Her alkaline phosphatase was normal at 143.   Past Medical History  Diagnosis Date  . Hx of gastritis   . GERD (gastroesophageal reflux disease)   . Irritable bowel syndrome (IBS)   . Depression   . Hypertension   . Hyperlipidemia   . Sleep apnea   . Diabetes mellitus type II   . Fibromyalgia   . Fatty liver   . Arthritis   . Anxiety   . Uterine cancer     History of   Past Surgical History  Procedure Date  . Appendectomy   . Abdominal hysterectomy   . Cholecystectomy   . Sinus surgery with instatrak   . Tubal ligation   . Breast lumpectomy     reports that she has never smoked. She has never used smokeless tobacco. She reports that she does not drink alcohol or use illicit drugs. family history includes Breast cancer in her sister; Colon cancer in her maternal uncle; Colon polyps in her father; Diabetes in her maternal aunt; Heart disease in her father, maternal uncle, and unspecified family member; Irritable bowel syndrome in her  daughter; Ovarian cancer in her maternal aunt; and Stomach cancer in her maternal aunt. Allergies  Allergen Reactions  . Codeine     REACTION: "nervous"  . Sulfonamide Derivatives     REACTION: itching        Review of Systems: Negative for dysphagia odynophagia. Denies shortness of breath  The remainder of the 10 point ROS is negative except as outlined in H&P   Physical Exam: General appearance  Well developed, in no distress. Eyes- non icteric. HEENT nontraumatic, normocephalic. Mouth no lesions, tongue papillated, no cheilosis. Neck supple without adenopathy, thyroid not enlarged, no carotid bruits, no JVD. Lungs Clear to auscultation bilaterally. Cor normal S1, normal S2, regular rhythm, no murmur,  quiet precordium. Abdomen: Soft but very tender in midepigastrium. Mild tenderness right upper quadrant. Liver edge at costal margin. There is no ascites. Spleen not enlarged. Lower abdomen is unremarkable. Rectal: Not done. Extremities no pedal edema. Skin no lesions. Neurological alert and oriented x 3. Psychological normal mood and affect.  Assessment and Plan:  Problem #1 Continued epigastric discomfort likely related to the use of Naprosyn 500 mg twice a day which may be causing gastropathy. We need to rule out peptic ulcer disease. Will stop Naprosyn and proceed with upper endoscopy and biopsies. She will continue AcipHex 20 mg twice a day and we will add Carafate slurry 10 cc by mouth twice a  day.  Problem #2 Colorectal screening. She is up-to-date on her colonoscopy. Her last exam in January 2012 was normal.  Problem #3 Fatty liver and abnormal liver function tests. Patient is status post remote cholecystectomy. We will check her hepatitis B and C serologies, ceruloplasmin, ANA titer, antimitochondrial antibody, IgG and IgM.  Problem #4 Constipation. I encouraged her to increase her fiber intake.  04/22/2012 Lina Sar

## 2012-04-23 ENCOUNTER — Encounter: Payer: Self-pay | Admitting: Internal Medicine

## 2012-04-23 LAB — HEPATITIS B CORE ANTIBODY, TOTAL: Hep B Core Total Ab: NEGATIVE

## 2012-04-23 LAB — ANA: Anti Nuclear Antibody(ANA): NEGATIVE

## 2012-04-24 LAB — MITOCHONDRIAL ANTIBODIES: Mitochondrial M2 Ab, IgG: 0.22 (ref <0.91)

## 2012-04-25 LAB — CERULOPLASMIN: Ceruloplasmin: 35 mg/dL (ref 20–60)

## 2012-04-25 LAB — IGM: IgM, Serum: 99 mg/dL (ref 52–322)

## 2012-04-25 LAB — IGG: IgG (Immunoglobin G), Serum: 1120 mg/dL (ref 690–1700)

## 2012-05-06 ENCOUNTER — Ambulatory Visit (AMBULATORY_SURGERY_CENTER): Payer: 59 | Admitting: Internal Medicine

## 2012-05-06 ENCOUNTER — Encounter: Payer: Self-pay | Admitting: Internal Medicine

## 2012-05-06 VITALS — BP 143/73 | HR 75 | Temp 97.8°F | Resp 20 | Ht 65.0 in | Wt 179.0 lb

## 2012-05-06 DIAGNOSIS — K2990 Gastroduodenitis, unspecified, without bleeding: Secondary | ICD-10-CM

## 2012-05-06 DIAGNOSIS — D131 Benign neoplasm of stomach: Secondary | ICD-10-CM

## 2012-05-06 DIAGNOSIS — K2970 Gastritis, unspecified, without bleeding: Secondary | ICD-10-CM

## 2012-05-06 DIAGNOSIS — R1013 Epigastric pain: Secondary | ICD-10-CM

## 2012-05-06 LAB — GLUCOSE, CAPILLARY
Glucose-Capillary: 201 mg/dL — ABNORMAL HIGH (ref 70–99)
Glucose-Capillary: 145 mg/dL — ABNORMAL HIGH (ref 70–99)

## 2012-05-06 MED ORDER — SODIUM CHLORIDE 0.9 % IV SOLN: 500.0000 mL | INTRAVENOUS | Status: DC

## 2012-05-06 NOTE — Patient Instructions (Signed)

## 2012-05-06 NOTE — Op Note (Signed)
[facility] [address] [place], [address]   ENDOSCOPY PROCEDURE REPORT  PATIENT: Alexis [name], Alexis [name].  MR#: [id number] BIRTHDATE: Dec 20, 1950 , 61  yrs. old GENDER: Female ENDOSCOPIST: Hart Carwin, MD REFERRED BY:  Adela Lank, M.D. PROCEDURE DATE:  05/06/2012 PROCEDURE:  EGD w/ biopsy ASA CLASS:     Class III INDICATIONS:  refractory to ppi, NAPROSYN d/c'ED. MEDICATIONS: MAC sedation, administered by CRNA and Propofol (Diprivan) 200 mg IV TOPICAL ANESTHETIC: Cetacaine Spray  DESCRIPTION OF PROCEDURE: After the risks benefits and alternatives of the procedure were thoroughly explained, informed consent was obtained.  The LB-GIF Q180 [id number] endoscope was introduced through the mouth and advanced to the second portion of the duodenum. Without limitations.  The instrument was slowly withdrawn as the mucosa was fully examined.      ESOPHAGUS: Irregular z-line.  STOMACH: Moderate gastritis (inflammation) was found in the gastric body and gastric antrum.  A biopsy was performed using cold forceps.  Sample obtained for helicobacter pylori testing.   A firm flat polyp ranging between 3-25mm in size was found in the cardia. A biopsy was performed using cold forceps.  Retroflexed views revealed no abnormalities.     The scope was then withdrawn from the patient and the procedure completed.  COMPLICATIONS: There were no complications. ENDOSCOPIC IMPRESSION: 1.   Irregular z-line, s/p biopdied 2.   Gastritis (inflammation) was found in the gastric body and gastric antrum; biopsy , r/o Naproxen gastropathy, r/o H.Pylori 3. proxiimal gastric polyp,removed RECOMMENDATIONS: 1.  Await pathology results 2.  continue PPI 3.  continue Carafate 1gm bid  REPEAT EXAM: no recall  eSigned:  Hart Carwin, MD 05/06/2012 4:10 PM   CC:  PATIENT NAME:  [name]. MR#: [id number]

## 2012-05-06 NOTE — Progress Notes (Signed)
Patient did not experience any of the following events: a burn prior to discharge; a fall within the facility; wrong site/side/patient/procedure/implant event; or a hospital transfer or hospital admission upon discharge from the facility. (G8907) Patient did not have preoperative order for IV antibiotic SSI prophylaxis. (G8918)  

## 2012-05-06 NOTE — Progress Notes (Signed)
The pt tolerated the egd very well. Maw   

## 2012-05-07 ENCOUNTER — Telehealth: Payer: Self-pay | Admitting: *Deleted

## 2012-05-07 NOTE — Telephone Encounter (Signed)
No answer, left message to call if questions or concerns. 

## 2012-05-13 ENCOUNTER — Encounter: Payer: Self-pay | Admitting: Internal Medicine

## 2012-05-18 ENCOUNTER — Telehealth: Payer: Self-pay | Admitting: Internal Medicine

## 2012-05-18 NOTE — Telephone Encounter (Signed)
She was called but there was no answer according to S.Tellis,RN. Bx's do not show vasculitis. The gastritis is non specific. Not from vasculitis.

## 2012-05-18 NOTE — Telephone Encounter (Signed)
Spoke with patient and read her the results letter. Patient has a history of vasculitis of the skin. She is wondering if this is causing her gastritis. She states she is still tender in the stomach area. Please, advise.

## 2012-05-19 NOTE — Telephone Encounter (Signed)
Left a message for patient to call me. 

## 2012-05-20 NOTE — Telephone Encounter (Signed)
Left a message for patient to call me. 

## 2012-05-20 NOTE — Telephone Encounter (Signed)
Spoke with patient and gave her Dr. Regino Schultze answer to her question

## 2012-08-12 DIAGNOSIS — I219 Acute myocardial infarction, unspecified: Secondary | ICD-10-CM

## 2012-08-12 HISTORY — DX: Acute myocardial infarction, unspecified: I21.9

## 2012-12-09 ENCOUNTER — Telehealth: Payer: Self-pay | Admitting: Internal Medicine

## 2012-12-09 MED ORDER — HYDROCORTISONE ACETATE 25 MG RE SUPP: RECTAL | Status: DC

## 2012-12-09 NOTE — Telephone Encounter (Signed)
Patient notified of Dr. Regino Schultze recommendations. She will call back if bleeding continues. She also reports she is on Aciphex BID. States she missed 2 days because she ran out but is back on it now. She wants to see if things improve over the next few days.

## 2012-12-09 NOTE — Telephone Encounter (Signed)
She has a hx of constipation so her rectal bleeding may be anorectal. ( last colon 2012 was OK). Please send Anusol HC supp #12, inser 1 hs, if bleeding continues, get CBC.Upper abd. Pain ? Is she still on Nexiem? May want to increase it. To bid x 1 week.

## 2012-12-09 NOTE — Telephone Encounter (Signed)
Spoke with patient and she noticed yesterday after having a bowel movement of little "jellybean like stool" that she had blood on the tissue when she wiped. This occurred several more times. She reports a history of constipation but no hx of hemorrhoids. He husband looked at her rectum and thought the blood was from the inside of her rectum. Also, she reports hurting in the middle of her chest under the breast on the right side all the way around to her back. She is taking Aciphex BID. Please, advise.

## 2013-01-21 ENCOUNTER — Other Ambulatory Visit: Payer: Self-pay | Admitting: Endocrinology

## 2013-01-21 DIAGNOSIS — R519 Headache, unspecified: Secondary | ICD-10-CM

## 2013-01-22 ENCOUNTER — Ambulatory Visit
Admission: RE | Admit: 2013-01-22 | Discharge: 2013-01-22 | Disposition: A | Discharge status: Home / Self Care | Payer: 59 | Source: Ambulatory Visit | Attending: Endocrinology | Admitting: Endocrinology

## 2013-01-22 DIAGNOSIS — R519 Headache, unspecified: Secondary | ICD-10-CM

## 2013-01-22 MED ORDER — IOHEXOL 300 MG/ML  SOLN: 75.0000 mL | Freq: Once | INTRAMUSCULAR | Status: AC | PRN

## 2013-01-28 ENCOUNTER — Encounter: Payer: Self-pay | Admitting: Internal Medicine

## 2013-01-28 ENCOUNTER — Ambulatory Visit (INDEPENDENT_AMBULATORY_CARE_PROVIDER_SITE_OTHER): Payer: 59 | Admitting: Internal Medicine

## 2013-01-28 VITALS — BP 130/90 | HR 82 | Ht 65.0 in | Wt 186.7 lb

## 2013-01-28 DIAGNOSIS — E119 Type 2 diabetes mellitus without complications: Secondary | ICD-10-CM

## 2013-01-28 DIAGNOSIS — E785 Hyperlipidemia, unspecified: Secondary | ICD-10-CM

## 2013-01-28 DIAGNOSIS — I10 Essential (primary) hypertension: Secondary | ICD-10-CM

## 2013-01-28 DIAGNOSIS — R079 Chest pain, unspecified: Secondary | ICD-10-CM

## 2013-01-28 NOTE — Patient Instructions (Addendum)
Your physician has requested that you have a lexiscan myoview. For further information please visit [email or web address] Please follow instruction sheet, as given.  Your physician recommends that you schedule a follow-up appointment in: after your testing.

## 2013-01-29 ENCOUNTER — Encounter: Payer: Self-pay | Admitting: Internal Medicine

## 2013-01-29 DIAGNOSIS — R079 Chest pain, unspecified: Secondary | ICD-10-CM | POA: Insufficient documentation

## 2013-01-29 NOTE — Progress Notes (Signed)
OFFICE NOTE  Chief Complaint:  Chest pain, abnormal EKG  Primary Care Physician: Michiel Sites, MD  HPI:  Alexis [name] is a pleasant 62 year old female patient of Dr. Eather Colas heart, with a history of diabetes type 2, dyslipidemia, hypertension, vasculitis, fatty liver, IBS, GERD, and numerous other medical problems. In the past she was evaluated for an abnormal EKG by Dr. Lucas Mallow, having had an echo in 2008 which showed an EF of 55-65%, mild aortic valve sclerosis, and mild mitral annular calcification with mild diastolic dysfunction. Her EKG in the past has been normal which demonstrated poor R-wave progression. On ear EKG in the office it was actually interpreted by the computer to be anterior lateral infarct. In the past she wore a Holter monitor in 2004 due to an episode of syncope, which demonstrated occasional unifocal PVCs. No sustained arrhythmias were noted. Today she reports chest pain which is somewhat atypical. It is located on the left breast and occasionally the sternum. It is worse with bending or changing position not necessarily associated with exertion or relieved by rest. Her EKG does show poor R-wave progression and incomplete right bundle branch block. I don't believe this is a true anteroseptal infarct, as it was present in the past however her echocardiogram did not show any wall motion abnormalities consistent with prior infarct. One would also expect her EF to be lower if she had a large prior anterolateral MI.  PMHx:  Past Medical History  Diagnosis Date  . Hx of gastritis   . GERD (gastroesophageal reflux disease)   . Irritable bowel syndrome (IBS)   . Depression   . Hypertension   . Hyperlipidemia   . Sleep apnea   . Diabetes mellitus type II   . Fibromyalgia   . Fatty liver   . Arthritis   . Anxiety   . Uterine cancer     History of    Past Surgical History  Procedure Laterality Date  . Appendectomy    . Abdominal hysterectomy    . Cholecystectomy     . Sinus surgery with instatrak    . Tubal ligation    . Breast lumpectomy      FAMHx:  Family History  Problem Relation Age of Onset  . Breast cancer Sister   . Colon cancer Maternal Uncle   . Ovarian cancer Maternal Aunt   . Stomach cancer Maternal Aunt   . Colon polyps Father   . Diabetes Maternal Aunt   . Heart disease Father   . Heart disease Maternal Uncle   . Heart disease      Grandparents  . Irritable bowel syndrome Daughter     SOCHx:   reports that she has never smoked. She has never used smokeless tobacco. She reports that she does not drink alcohol or use illicit drugs.  ALLERGIES:  Allergies  Allergen Reactions  . Codeine     REACTION: "nervous"  . Sulfonamide Derivatives     REACTION: itching    ROS: A comprehensive review of systems was negative except for: Constitutional: positive for fatigue Cardiovascular: positive for chest pressure/discomfort Integument/breast: positive for rash  HOME MEDS: Current Outpatient Prescriptions  Medication Sig Dispense Refill  . ALPRAZolam (XANAX) 0.5 MG tablet Take 0.5 mg by mouth at bedtime as needed.      Marland Kitchen buPROPion (WELLBUTRIN XL) 300 MG 24 hr tablet Take 300 mg by mouth daily.      . Calcium Carb-Cholecalciferol (CALCIUM + D3) 600-200 MG-UNIT TABS Take 1 tablet by  mouth 2 (two) times daily.      . Canagliflozin (INVOKANA) 300 MG TABS Take 300 mg by mouth daily.      . Cholecalciferol (VITAMIN D3) 1000 UNITS CAPS Take 1 capsule by mouth daily.      . fish oil-omega-3 fatty acids 1000 MG capsule Take 1 g by mouth daily.      . insulin glargine (LANTUS) 100 UNIT/ML injection Inject 40 Units into the skin every morning.      . irbesartan (AVAPRO) 300 MG tablet Take 300 mg by mouth at bedtime.      . metoprolol succinate (TOPROL-XL) 50 MG 24 hr tablet Take 50 mg by mouth daily. Take with or immediately following a meal.      . naproxen sodium (ANAPROX) 220 MG tablet Take 220 mg by mouth 2 (two) times daily with a  meal.      . NON FORMULARY Place 1 patch onto the skin 2 (two) times a week. Estrogen patch      . pregabalin (LYRICA) 50 MG capsule Take 50 mg by mouth 2 (two) times daily.      Marland Kitchen pyridOXINE (VITAMIN B-6) 100 MG tablet Take 100 mg by mouth daily.      . RABEprazole (ACIPHEX) 20 MG tablet Take 1 tablet (20 mg total) by mouth 2 (two) times daily.  60 tablet  3  . rizatriptan (MAXALT) 10 MG tablet Take 10 mg by mouth as needed. May repeat in 2 hours if needed      . traMADol (ULTRAM) 50 MG tablet Take 50 mg by mouth every 6 (six) hours as needed.      . venlafaxine XR (EFFEXOR-XR) 150 MG 24 hr capsule Take 150 mg by mouth 2 (two) times daily.      . vitamin B-12 (CYANOCOBALAMIN) 1000 MCG tablet Take 1,000 mcg by mouth daily.      . zaleplon (SONATA) 10 MG capsule Take 10 mg by mouth at bedtime.      . Atorvastatin Calcium (LIPITOR PO) Take 1 tablet by mouth daily.       No current facility-administered medications for this visit.    LABS/IMAGING: No results found for this or any previous visit (from the past 48 hour(s)). No results found.  VITALS: BP 130/90  Pulse 82  Ht 5\' 5"  (1.651 m)  Wt 186 lb 11.2 oz (84.687 kg)  BMI 31.07 kg/m2  EXAM: General appearance: alert and no distress Neck: no adenopathy, no carotid bruit, no JVD, supple, symmetrical, trachea midline and thyroid not enlarged, symmetric, no tenderness/mass/nodules Lungs: clear to auscultation bilaterally Heart: regular rate and rhythm, S1, S2 normal, no murmur, click, rub or gallop Abdomen: soft, non-tender; bowel sounds normal; no masses,  no organomegaly and obese Extremities: extremities normal, atraumatic, no cyanosis or edema Pulses: 2+ and symmetric Skin: Skin color, texture, turgor normal. No rashes or lesions Neurologic: Grossly normal  EKG: Normal sinus rhythm at 82, incomplete right bundle branch block, minimal voltage criteria for LVH, poor R-wave progression  ASSESSMENT: 1. Atypical chest  pain 2. Abnormal EKG with poor R-wave progression versus possible anterolateral MI 3. History of normal echocardiogram in 2008 4. History of syncope with a normal Holter monitor except for occasional PVCs 5. Diabetes type 2 6. Hypertension 7. Dyslipidemia 8. Vasculitis  PLAN: 1.    Ms. Bisher has numerous risk factors recollection of any cardiovascular event which would've been associated with an anterior lateral MI. I suspect her EKG changes which have been present  for some time, have to do with positioning of the heart and not a prior anterolateral MI, which is supported by an echo in 2008 with normal wall motion. Nevertheless, she should be evaluated for coronary ischemia. I recommend a stress test with nuclear perfusion imaging. We'll plan to see her back in the office to review the results of the stress test in a few weeks.   Thank you again for the kind referral.  Chrystie Nose, MD, [facility] Attending Cardiologist [facility]  HILTY,Kenneth C 01/29/2013, 11:23 AM

## 2013-02-01 ENCOUNTER — Encounter: Payer: Self-pay | Admitting: *Deleted

## 2013-02-02 ENCOUNTER — Encounter: Payer: Self-pay | Admitting: Internal Medicine

## 2013-02-05 ENCOUNTER — Encounter: Payer: Self-pay | Admitting: Internal Medicine

## 2013-02-05 ENCOUNTER — Ambulatory Visit (HOSPITAL_COMMUNITY)
Admission: RE | Admit: 2013-02-05 | Discharge: 2013-02-05 | Disposition: A | Discharge status: Home / Self Care | Payer: 59 | Source: Ambulatory Visit | Attending: Cardiovascular Disease | Admitting: Cardiovascular Disease

## 2013-02-05 ENCOUNTER — Ambulatory Visit (INDEPENDENT_AMBULATORY_CARE_PROVIDER_SITE_OTHER): Payer: 59 | Admitting: Internal Medicine

## 2013-02-05 VITALS — BP 152/66 | HR 88 | Ht 65.0 in | Wt 189.0 lb

## 2013-02-05 DIAGNOSIS — R109 Unspecified abdominal pain: Secondary | ICD-10-CM

## 2013-02-05 DIAGNOSIS — R5383 Other fatigue: Secondary | ICD-10-CM | POA: Insufficient documentation

## 2013-02-05 DIAGNOSIS — R079 Chest pain, unspecified: Secondary | ICD-10-CM

## 2013-02-05 DIAGNOSIS — K219 Gastro-esophageal reflux disease without esophagitis: Secondary | ICD-10-CM

## 2013-02-05 DIAGNOSIS — R0609 Other forms of dyspnea: Secondary | ICD-10-CM | POA: Insufficient documentation

## 2013-02-05 DIAGNOSIS — R0989 Other specified symptoms and signs involving the circulatory and respiratory systems: Secondary | ICD-10-CM | POA: Insufficient documentation

## 2013-02-05 DIAGNOSIS — R42 Dizziness and giddiness: Secondary | ICD-10-CM | POA: Insufficient documentation

## 2013-02-05 DIAGNOSIS — R5381 Other malaise: Secondary | ICD-10-CM | POA: Insufficient documentation

## 2013-02-05 MED ORDER — SUCRALFATE 1 G PO TABS: 1.0000 g | ORAL_TABLET | Freq: Two times a day (BID) | ORAL | Status: DC

## 2013-02-05 MED ORDER — REGADENOSON 0.4 MG/5ML IV SOLN
0.4000 mg | Freq: Once | INTRAVENOUS | Status: AC
  Administered 2013-02-05: 0.4 mg via INTRAVENOUS

## 2013-02-05 MED ORDER — TECHNETIUM TC 99M SESTAMIBI GENERIC - CARDIOLITE
10.4000 | Freq: Once | INTRAVENOUS | Status: AC | PRN
  Administered 2013-02-05: 10 via INTRAVENOUS

## 2013-02-05 MED ORDER — TECHNETIUM TC 99M SESTAMIBI GENERIC - CARDIOLITE
30.2000 | Freq: Once | INTRAVENOUS | Status: AC | PRN
  Administered 2013-02-05: 30.2 via INTRAVENOUS

## 2013-02-05 MED ORDER — POLYETHYLENE GLYCOL 3350 17 GM/SCOOP PO POWD: ORAL | Status: DC

## 2013-02-05 NOTE — Patient Instructions (Addendum)
You have been scheduled for a gastric emptying scan at [facility] (1st floor) on 02/19/13 at 9:00 am. Please arrive at least 15 minutes prior to your appointment for registration. Please make certain not to have anything to eat or drink after midnight the night before your test. Hold all stomach medications (ex: Zofran, phenergan, Reglan) 48 hours prior to your test. If you need to reschedule your appointment, please contact radiology scheduling at [phone]. _____________________________________________________________________ A gastric-emptying study measures how long it takes for food to move through your stomach. There are several ways to measure stomach emptying. In the most common test, you eat food that contains a small amount of radioactive material. A scanner that detects the movement of the radioactive material is placed over your abdomen to monitor the rate at which food leaves your stomach. This test normally takes about 2 hours to complete. _____________________________________________________________________  We have sent the following medications to your pharmacy for you to pick up at your convenience: Carafate Miralax  CC: Dr Darci Needle, Dr Rennis Golden

## 2013-02-05 NOTE — Progress Notes (Signed)
Alexis [name] 1951-05-21 MRN [id number]        History of Present Illness:  This is a 62 year old white female insulin-dependent diabetic and peripheral neuropathy, with chronic right upper quadrant and epigastric pain and tenderness. She has known fatty liver and chronic constipation. She also has NSAID -induced gastritis documented on upper endoscopy in September 2013. She is status post a remote cholecystectomy in 1990. She has mild abnormalities of liver function tests specifically elevation of alkaline phosphatase. Last CT scan of the abdomen in 2007 for right upper quadrant abdominal pain showed no active process  She has a hiatal hernia and gastroesophageal reflux which is partially controlled with AcipHex 20 mg twice a day. She continues to gain weight from 179 pounds last September to  189 pounds today. Last colonoscopy February 2012 family history of colon cancer was normal. Prior colonoscopies were in 2000 and 2007   Past Medical History  Diagnosis Date  . Hx of gastritis   . GERD (gastroesophageal reflux disease)   . Irritable bowel syndrome (IBS)   . Depression   . Hypertension   . Hyperlipidemia   . Sleep apnea   . Diabetes mellitus type II   . Fibromyalgia   . Fatty liver   . Arthritis   . Anxiety   . Uterine cancer   . Hyperplastic colon polyp   . Diverticulitis   . Pancreatic cyst     peripancreatic cystic lesion  . Renal cyst   . Heart attack    Past Surgical History  Procedure Laterality Date  . Appendectomy    . Abdominal hysterectomy    . Cholecystectomy    . Sinus surgery with instatrak    . Tubal ligation    . Breast lumpectomy      reports that she has never smoked. She has never used smokeless tobacco. She reports that she does not drink alcohol or use illicit drugs. family history includes Breast cancer in her sister; Colon cancer in her maternal uncle; Colon polyps in her father; Diabetes in her maternal aunt; Heart disease in her father,  maternal uncle, and unspecified family member; Irritable bowel syndrome in her daughter; Ovarian cancer in her maternal aunt; and Stomach cancer in her maternal aunt. Allergies  Allergen Reactions  . Codeine     REACTION: "nervous"  . Sulfonamide Derivatives     REACTION: itching        Review of Systems: Occasional dysphagia. Constipation. Weight gain  The remainder of the 10 point ROS is negative except as outlined in H&P   Physical Exam: General appearance  Well developed, in no distress. Eyes- non icteric. HEENT nontraumatic, normocephalic. Mouth no lesions, tongue papillated, no cheilosis. Neck supple without adenopathy, thyroid not enlarged, no carotid bruits, no JVD. Lungs Clear to auscultation bilaterally. Cor normal S1, normal S2, regular rhythm, no murmur,  quiet precordium. Abdomen: Protuberant,obese, very tender in the subxiphoid area and along the right costal margin. The ribs are tender. Liver edge at costal margin. There is no ascites. Rectal: Not done. Extremities no pedal edema. Skin no lesions. Neurological alert and oriented x 3. I told her  Psychological normal mood and affect.  Assessment and Plan:  Problem #28 62 year old white female with chronic right upper quadrant and epigastric discomfort which may be a result of several things; one would be hepatomegaly due to fatty infiltration causing crowding effect, and the other is constipation since the pain seems to get worse when she is constipated. We will put  her on MiraLax 9-17 g several times a week. This could also be due to gastritis and gastroesophageal reflux and as well as gastroparesis, causing gastric retention and feeling of fullness. Her weight gain is also contributing to the pressure effect in the RUQ.. In addition to AcipHex 20 mg twice a day, we will add Carafate 1 g twice a day. We will go ahead with a gastric emptying scan to assess her gastric motility. I have discussed at length with the patient  weight loss. She will start water aerobics. Treatment for fatty liver is weight loss and exercise.   02/05/2013 Lina Sar

## 2013-02-05 NOTE — Procedures (Addendum)
[facility] [address] [place] [address] [place] [address] [phone]  Cardiology Nuclear Med Study  Alexis [name] is a 62 y.o. female     MRN : [id number]     DOB: 13-Dec-1950  Procedure Date: 02/05/2013  Nuclear Med Background Indication for Stress Test:  Evaluation for Ischemia and Abnormal EKG History:  irregular heartbeat Cardiac Risk Factors: Family History - CAD, Hypertension, IDDM Type 2, Lipids and Obesity  Symptoms:  Chest Pain, Dizziness, DOE and Fatigue   Nuclear Pre-Procedure Caffeine/Decaff Intake:  12:00am NPO After: 10AM   IV Site: R Hand  IV 0.9% NS with Angio Cath:  22g  Chest Size (in):  N/A IV Started by: Emmit Pomfret, RN  Height: 5\' 5"  (1.651 m)  Cup Size: D  BMI:  Body mass index is 30.95 kg/(m^2). Weight:  186 lb (84.369 kg)   Tech Comments:  N/A    Nuclear Med Study 1 or 2 day study: 1 day  Stress Test Type:  Lexiscan  Order Authorizing Provider:  Zoila Shutter, MD   Resting Radionuclide: Technetium 83m Sestamibi  Resting Radionuclide Dose: 10.4 mCi   Stress Radionuclide:  Technetium 39m Sestamibi  Stress Radionuclide Dose: 30.2 mCi           Stress Protocol Rest HR: 83 Stress HR: 93  Rest BP: 172/99 Stress BP: 187/95  Exercise Time (min): n/a METS: n/a   Predicted Max HR: 159 bpm % Max HR: 58.49 bpm Rate Pressure Product: 32440  Dose of Adenosine (mg):  n/a Dose of Lexiscan: 0.4 mg  Dose of Atropine (mg): n/a Dose of Dobutamine: n/a mcg/kg/min (at max HR)  Stress Test Technologist: Esperanza Sheets, CCT Nuclear Technologist: Gonzella Lex, CNMT   Rest Procedure:  Myocardial perfusion imaging was performed at rest 45 minutes following the intravenous administration of Technetium 91m Sestamibi. Stress Procedure:  The patient received IV Lexiscan 0.4 mg over 15-seconds.  Technetium 61m Sestamibi injected at 30-seconds.  There were no significant changes with Lexiscan.  Quantitative spect images  were obtained after a 45 minute delay.  Transient Ischemic Dilatation (Normal <1.22):  1.24 Lung/Heart Ratio (Normal <0.45):  0.22 QGS EDV:  58 ml QGS ESV:  21 ml LV Ejection Fraction: 63%  Signed by       Rest ECG: NSR - Normal EKG  Stress ECG: No significant change from baseline ECG  QPS Raw Data Images:  Normal; no motion artifact; normal heart/lung ratio. Stress Images:  Normal homogeneous uptake in all areas of the myocardium. Rest Images:  Normal homogeneous uptake in all areas of the myocardium. Subtraction (SDS):  No evidence of ischemia.  Impression Exercise Capacity:  Lexiscan with no exercise. BP Response:  Normal blood pressure response. Clinical Symptoms:  Mild chest pain/dyspnea. ECG Impression:  No significant ST segment change suggestive of ischemia. Comparison with Prior Nuclear Study: No images to compare  Overall Impression:  Normal stress nuclear study.  LV Wall Motion:  NL LV Function; NL Wall Motion   Runell Gess, MD  02/05/2013 5:56 PM

## 2013-02-08 ENCOUNTER — Telehealth: Payer: Self-pay | Admitting: *Deleted

## 2013-02-08 NOTE — Telephone Encounter (Signed)
Message copied by Chauncey Reading on Mon Feb 08, 2013  8:16 AM ------      Message from: Chrystie Nose      Created: Sat Feb 06, 2013  7:56 AM       Please notify patient that the test results were normal.            -Dr. Rennis Golden       ------

## 2013-02-08 NOTE — Telephone Encounter (Signed)
Result letter drafted and mailed to pt.  

## 2013-02-10 ENCOUNTER — Ambulatory Visit (INDEPENDENT_AMBULATORY_CARE_PROVIDER_SITE_OTHER): Payer: 59 | Admitting: Internal Medicine

## 2013-02-10 ENCOUNTER — Encounter: Payer: Self-pay | Admitting: Internal Medicine

## 2013-02-10 VITALS — BP 138/86 | HR 88 | Ht 65.0 in | Wt 186.6 lb

## 2013-02-10 DIAGNOSIS — R079 Chest pain, unspecified: Secondary | ICD-10-CM

## 2013-02-10 DIAGNOSIS — Z8719 Personal history of other diseases of the digestive system: Secondary | ICD-10-CM

## 2013-02-10 DIAGNOSIS — G4730 Sleep apnea, unspecified: Secondary | ICD-10-CM

## 2013-02-10 DIAGNOSIS — K7689 Other specified diseases of liver: Secondary | ICD-10-CM

## 2013-02-10 DIAGNOSIS — I10 Essential (primary) hypertension: Secondary | ICD-10-CM

## 2013-02-10 DIAGNOSIS — M129 Arthropathy, unspecified: Secondary | ICD-10-CM

## 2013-02-10 DIAGNOSIS — E119 Type 2 diabetes mellitus without complications: Secondary | ICD-10-CM

## 2013-02-10 DIAGNOSIS — E785 Hyperlipidemia, unspecified: Secondary | ICD-10-CM

## 2013-02-10 DIAGNOSIS — IMO0001 Reserved for inherently not codable concepts without codable children: Secondary | ICD-10-CM

## 2013-02-10 DIAGNOSIS — F411 Generalized anxiety disorder: Secondary | ICD-10-CM

## 2013-02-10 NOTE — Progress Notes (Signed)
OFFICE NOTE  Chief Complaint:  Chest pain, abnormal EKG  Primary Care Physician: Michiel Sites, MD  HPI:  Alexis [name] is a pleasant 62 year old female patient of Dr. Eather Colas heart, with a history of diabetes type 2, dyslipidemia, hypertension, vasculitis, fatty liver, IBS, GERD, and numerous other medical problems. In the past she was evaluated for an abnormal EKG by Dr. Lucas Mallow, having had an echo in 2008 which showed an EF of 55-65%, mild aortic valve sclerosis, and mild mitral annular calcification with mild diastolic dysfunction. Her EKG in the past has been normal which demonstrated poor R-wave progression. On ear EKG in the office it was actually interpreted by the computer to be anterior lateral infarct. In the past she wore a Holter monitor in 2004 due to an episode of syncope, which demonstrated occasional unifocal PVCs. No sustained arrhythmias were noted. Today she reports chest pain which is somewhat atypical. It is located on the left breast and occasionally the sternum. It is worse with bending or changing position not necessarily associated with exertion or relieved by rest. Her EKG does show poor R-wave progression and incomplete right bundle branch block. I don't believe this is a true anteroseptal infarct, as it was present in the past however her echocardiogram did not show any wall motion abnormalities consistent with prior infarct. One would also expect her EF to be lower if she had a large prior anterolateral MI.    I went ahead and ordered a nuclear stress test which she underwent on 02/05/2013. This was a lexiscan study and demonstrated an EF of 63% with normal radiotracer uptake and no stress induced perfusion defects. Was no evidence for prior infarct.  PMHx:  Past Medical History  Diagnosis Date  . Hx of gastritis   . GERD (gastroesophageal reflux disease)   . Irritable bowel syndrome (IBS)   . Depression   . Hypertension   . Hyperlipidemia   . Sleep apnea     . Diabetes mellitus type II   . Fibromyalgia   . Fatty liver   . Arthritis   . Anxiety   . Uterine cancer   . Hyperplastic colon polyp   . Diverticulitis   . Pancreatic cyst     peripancreatic cystic lesion  . Renal cyst   . Heart attack     Past Surgical History  Procedure Laterality Date  . Appendectomy    . Abdominal hysterectomy    . Cholecystectomy    . Sinus surgery with instatrak    . Tubal ligation    . Breast lumpectomy      FAMHx:  Family History  Problem Relation Age of Onset  . Breast cancer Sister   . Colon cancer Maternal Uncle   . Ovarian cancer Maternal Aunt   . Stomach cancer Maternal Aunt   . Colon polyps Father   . Diabetes Maternal Aunt   . Heart disease Father   . Heart disease Maternal Uncle   . Heart disease      Grandparents  . Irritable bowel syndrome Daughter     SOCHx:   reports that she has never smoked. She has never used smokeless tobacco. She reports that she does not drink alcohol or use illicit drugs.  ALLERGIES:  Allergies  Allergen Reactions  . Codeine     REACTION: "nervous"  . Sulfonamide Derivatives     REACTION: itching    ROS: A comprehensive review of systems was negative except for: Constitutional: positive for fatigue Cardiovascular: positive for  chest pressure/discomfort Integument/breast: positive for rash  HOME MEDS: Current Outpatient Prescriptions  Medication Sig Dispense Refill  . ALPRAZolam (XANAX) 0.5 MG tablet Take 1 mg by mouth at bedtime as needed (0.5 mg prn at other times).       . Atorvastatin Calcium (LIPITOR PO) Take 1 tablet by mouth daily.      Marland Kitchen buPROPion (WELLBUTRIN XL) 300 MG 24 hr tablet Take 300 mg by mouth daily.      . Calcium Carb-Cholecalciferol (CALCIUM + D3) 600-200 MG-UNIT TABS Take 1 tablet by mouth 2 (two) times daily.      . Canagliflozin (INVOKANA) 300 MG TABS Take 300 mg by mouth daily.      . Cholecalciferol (VITAMIN D3) 1000 UNITS CAPS Take 1 capsule by mouth daily.       Marland Kitchen estradiol (VIVELLE-DOT) 0.075 MG/24HR Place 0.5 patches onto the skin 2 (two) times a week.      . fish oil-omega-3 fatty acids 1000 MG capsule Take 1 g by mouth daily.      . insulin glargine (LANTUS) 100 UNIT/ML injection Inject 40 Units into the skin every morning.      . irbesartan (AVAPRO) 300 MG tablet Take 300 mg by mouth at bedtime.      . metoprolol succinate (TOPROL-XL) 50 MG 24 hr tablet Take 50 mg by mouth daily. Take with or immediately following a meal.      . naproxen sodium (ANAPROX) 220 MG tablet Take 220 mg by mouth 2 (two) times daily with a meal.      . polyethylene glycol powder (GLYCOLAX/MIRALAX) powder Dissolve 9 grams (1/2 capful) in at least 8 ounces water or juice and drink three times weekly.  527 g  1  . pregabalin (LYRICA) 50 MG capsule Take 50 mg by mouth 2 (two) times daily.      Marland Kitchen pyridOXINE (VITAMIN B-6) 100 MG tablet Take 100 mg by mouth daily.      . RABEprazole (ACIPHEX) 20 MG tablet Take 1 tablet (20 mg total) by mouth 2 (two) times daily.  60 tablet  3  . rizatriptan (MAXALT) 10 MG tablet Take 10 mg by mouth as needed. May repeat in 2 hours if needed      . sucralfate (CARAFATE) 1 G tablet Take 1 tablet (1 g total) by mouth 2 (two) times daily.  60 tablet  1  . traMADol (ULTRAM) 50 MG tablet Take 100 mg by mouth 2 (two) times daily.       Marland Kitchen venlafaxine XR (EFFEXOR-XR) 150 MG 24 hr capsule Take 150 mg by mouth 2 (two) times daily.      . vitamin B-12 (CYANOCOBALAMIN) 1000 MCG tablet Take 1,000 mcg by mouth daily.      . zaleplon (SONATA) 10 MG capsule Take 10 mg by mouth at bedtime.       No current facility-administered medications for this visit.    LABS/IMAGING: No results found for this or any previous visit (from the past 48 hour(s)). No results found.  VITALS: BP 138/86  Pulse 88  Ht 5\' 5"  (1.651 m)  Wt 186 lb 9.6 oz (84.641 kg)  BMI 31.05 kg/m2  EXAM: Deferred   EKG: deferred  ASSESSMENT: 1. Atypical chest pain 2. Abnormal EKG  with poor R-wave progression versus possible anterolateral MI 3. History of normal echocardiogram in 2008 4. History of syncope with a normal Holter monitor except for occasional PVCs 5. Diabetes type 2 6. Hypertension 7. Dyslipidemia 8. Vasculitis  PLAN: 1.    Alexis [name] had a low risk nuclear stress test with a preserved EF. There is no evidence for prior infarct.  I suspect her pain is noncardiac, it does appear to be related to significant anxiety and/or panic attacks. She does have numerous risk factors for cardiovascular disease, and continued attempts at optimal blood glucose control, weight loss, cholesterol management and other risk factor modification is important. She can followup with me as needed.  Thank you again for the kind referral.  Chrystie Nose, MD, [facility] Attending Cardiologist [facility]  Alexis [name] 02/10/2013, 5:32 PM

## 2013-02-10 NOTE — Patient Instructions (Addendum)
Dr. Rennis Golden would like you to follow up as needed.

## 2013-02-15 ENCOUNTER — Ambulatory Visit: Payer: 59 | Admitting: Internal Medicine

## 2013-02-19 ENCOUNTER — Encounter (HOSPITAL_COMMUNITY)
Admission: RE | Admit: 2013-02-19 | Discharge: 2013-02-19 | Disposition: A | Discharge status: Home / Self Care | Payer: 59 | Source: Ambulatory Visit | Attending: Internal Medicine | Admitting: Internal Medicine

## 2013-02-19 DIAGNOSIS — R109 Unspecified abdominal pain: Secondary | ICD-10-CM | POA: Insufficient documentation

## 2013-02-19 DIAGNOSIS — K219 Gastro-esophageal reflux disease without esophagitis: Secondary | ICD-10-CM | POA: Insufficient documentation

## 2013-02-19 MED ORDER — TECHNETIUM TC 99M SULFUR COLLOID
2.2000 | Freq: Once | INTRAVENOUS | Status: AC | PRN
  Administered 2013-02-19: 2.2 via INTRAVENOUS

## 2013-03-18 ENCOUNTER — Encounter: Payer: Self-pay | Admitting: Neurology

## 2013-03-18 ENCOUNTER — Ambulatory Visit (INDEPENDENT_AMBULATORY_CARE_PROVIDER_SITE_OTHER): Payer: 59 | Admitting: Neurology

## 2013-03-18 VITALS — BP 137/71 | HR 77 | Ht 65.0 in | Wt 190.0 lb

## 2013-03-18 DIAGNOSIS — G43709 Chronic migraine without aura, not intractable, without status migrainosus: Secondary | ICD-10-CM | POA: Insufficient documentation

## 2013-03-18 DIAGNOSIS — R519 Headache, unspecified: Secondary | ICD-10-CM | POA: Insufficient documentation

## 2013-03-18 DIAGNOSIS — R51 Headache: Secondary | ICD-10-CM

## 2013-03-18 DIAGNOSIS — G43009 Migraine without aura, not intractable, without status migrainosus: Secondary | ICD-10-CM | POA: Insufficient documentation

## 2013-03-18 DIAGNOSIS — S139XXA Sprain of joints and ligaments of unspecified parts of neck, initial encounter: Secondary | ICD-10-CM

## 2013-03-18 HISTORY — DX: Sprain of joints and ligaments of unspecified parts of neck, initial encounter: S13.9XXA
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
## 2013-03-29 ENCOUNTER — Telehealth: Payer: Self-pay | Admitting: Neurology

## 2013-04-21 ENCOUNTER — Other Ambulatory Visit: Payer: Self-pay | Admitting: Internal Medicine

## 2013-05-25 ENCOUNTER — Encounter: Payer: Self-pay | Admitting: Internal Medicine

## 2013-07-13 ENCOUNTER — Telehealth: Payer: Self-pay

## 2013-07-13 NOTE — Telephone Encounter (Signed)
Called patient and left a message to call the office to change her appointment from either Friday, December 5 at 2:45 for a 3:00 pm or on Wednesday.

## 2013-07-14 NOTE — Telephone Encounter (Signed)
Patient called back and states her father has lung cancer and she has to be with him.  She will call back to re-schedule the appointment.

## 2013-07-15 ENCOUNTER — Ambulatory Visit: Payer: 59 | Admitting: Nurse Practitioner

## 2014-02-08 ENCOUNTER — Telehealth: Payer: Self-pay | Admitting: Internal Medicine

## 2014-02-08 MED ORDER — ONDANSETRON HCL 8 MG PO TABS: 8.0000 mg | ORAL_TABLET | Freq: Three times a day (TID) | ORAL | Status: DC | PRN

## 2014-02-08 NOTE — Telephone Encounter (Signed)
Spoke with patient and she has not felt well since last Friday. She had diarrhea, nausea and vomiting on last Friday. Since then, she has had nausea without vomiting. She also reports small diarrhea stools, gas and belching. Denies fever. She is taking a Probiotic and Aciphex. She is not taking Carafate. She did eat chicken and broccoli last night but has only had soup and crackers today. Please, advise.

## 2014-02-08 NOTE — Telephone Encounter (Signed)
Rx sent to pharmacy. Patient notified. 

## 2014-02-08 NOTE — Telephone Encounter (Signed)
Please send Zofran  8 mg, #20, 1 po q 8 hrs prn nausea, 1 refill

## 2014-02-09 ENCOUNTER — Encounter (HOSPITAL_COMMUNITY): Payer: Self-pay | Admitting: Emergency Medicine

## 2014-02-09 ENCOUNTER — Emergency Department (HOSPITAL_COMMUNITY)
Admission: EM | Admit: 2014-02-09 | Discharge: 2014-02-09 | Disposition: A | Discharge status: Home / Self Care | Payer: 59 | Attending: Emergency Medicine | Admitting: Emergency Medicine

## 2014-02-09 ENCOUNTER — Ambulatory Visit (HOSPITAL_COMMUNITY): Payer: 59

## 2014-02-09 DIAGNOSIS — Z794 Long term (current) use of insulin: Secondary | ICD-10-CM | POA: Insufficient documentation

## 2014-02-09 DIAGNOSIS — Z8719 Personal history of other diseases of the digestive system: Secondary | ICD-10-CM | POA: Insufficient documentation

## 2014-02-09 DIAGNOSIS — G43909 Migraine, unspecified, not intractable, without status migrainosus: Secondary | ICD-10-CM | POA: Insufficient documentation

## 2014-02-09 DIAGNOSIS — Z9089 Acquired absence of other organs: Secondary | ICD-10-CM | POA: Insufficient documentation

## 2014-02-09 DIAGNOSIS — R1084 Generalized abdominal pain: Secondary | ICD-10-CM | POA: Insufficient documentation

## 2014-02-09 DIAGNOSIS — E119 Type 2 diabetes mellitus without complications: Secondary | ICD-10-CM | POA: Insufficient documentation

## 2014-02-09 DIAGNOSIS — Z9851 Tubal ligation status: Secondary | ICD-10-CM | POA: Insufficient documentation

## 2014-02-09 DIAGNOSIS — M129 Arthropathy, unspecified: Secondary | ICD-10-CM | POA: Insufficient documentation

## 2014-02-09 DIAGNOSIS — Z8542 Personal history of malignant neoplasm of other parts of uterus: Secondary | ICD-10-CM | POA: Insufficient documentation

## 2014-02-09 DIAGNOSIS — F329 Major depressive disorder, single episode, unspecified: Secondary | ICD-10-CM | POA: Insufficient documentation

## 2014-02-09 DIAGNOSIS — Z860100 Personal history of colon polyps, unspecified: Secondary | ICD-10-CM | POA: Insufficient documentation

## 2014-02-09 DIAGNOSIS — R197 Diarrhea, unspecified: Secondary | ICD-10-CM | POA: Insufficient documentation

## 2014-02-09 DIAGNOSIS — E669 Obesity, unspecified: Secondary | ICD-10-CM | POA: Insufficient documentation

## 2014-02-09 DIAGNOSIS — I509 Heart failure, unspecified: Secondary | ICD-10-CM | POA: Insufficient documentation

## 2014-02-09 DIAGNOSIS — I10 Essential (primary) hypertension: Secondary | ICD-10-CM | POA: Insufficient documentation

## 2014-02-09 DIAGNOSIS — F411 Generalized anxiety disorder: Secondary | ICD-10-CM | POA: Insufficient documentation

## 2014-02-09 DIAGNOSIS — E785 Hyperlipidemia, unspecified: Secondary | ICD-10-CM | POA: Insufficient documentation

## 2014-02-09 DIAGNOSIS — Z90710 Acquired absence of both cervix and uterus: Secondary | ICD-10-CM | POA: Insufficient documentation

## 2014-02-09 DIAGNOSIS — F3289 Other specified depressive episodes: Secondary | ICD-10-CM | POA: Insufficient documentation

## 2014-02-09 DIAGNOSIS — R112 Nausea with vomiting, unspecified: Secondary | ICD-10-CM

## 2014-02-09 DIAGNOSIS — E876 Hypokalemia: Secondary | ICD-10-CM

## 2014-02-09 DIAGNOSIS — Z79899 Other long term (current) drug therapy: Secondary | ICD-10-CM | POA: Insufficient documentation

## 2014-02-09 LAB — URINALYSIS, ROUTINE W REFLEX MICROSCOPIC
Bilirubin Urine: NEGATIVE
Glucose, UA: >1000 mg/dL — AB
Hgb urine dipstick: NEGATIVE
Ketones, ur: NEGATIVE mg/dL
Leukocytes, UA: NEGATIVE
Nitrite: NEGATIVE
Protein, ur: NEGATIVE mg/dL
Specific Gravity, Urine: 1.041 — ABNORMAL HIGH (ref 1.005–1.030)
Urobilinogen, UA: 0.2 mg/dL (ref 0.0–1.0)
pH: 6 (ref 5.0–8.0)

## 2014-02-09 LAB — BASIC METABOLIC PANEL
Anion gap: 17 — ABNORMAL HIGH (ref 5–15)
BUN: 16 mg/dL (ref 6–23)
CO2: 24 mEq/L (ref 19–32)
Calcium: 9.1 mg/dL (ref 8.4–10.5)
Chloride: 95 mEq/L — ABNORMAL LOW (ref 96–112)
Creatinine, Ser: 0.73 mg/dL (ref 0.50–1.10)
GFR calc Af Amer: >90 mL/min (ref >90)
GFR calc non Af Amer: 90 mL/min — ABNORMAL LOW (ref >90)
Glucose, Bld: 255 mg/dL — ABNORMAL HIGH (ref 70–99)
Potassium: 2.9 mEq/L — CL (ref 3.7–5.3)
Sodium: 136 mEq/L — ABNORMAL LOW (ref 137–147)

## 2014-02-09 LAB — CBC WITH DIFFERENTIAL/PLATELET
Basophils Absolute: 0 10*3/uL (ref 0.0–0.1)
Basophils Relative: 0 % (ref 0–1)
Eosinophils Absolute: 0.1 10*3/uL (ref 0.0–0.7)
Eosinophils Relative: 1 % (ref 0–5)
HCT: 44.1 % (ref 36.0–46.0)
Hemoglobin: 15.1 g/dL — ABNORMAL HIGH (ref 12.0–15.0)
Lymphocytes Relative: 10 % — ABNORMAL LOW (ref 12–46)
Lymphs Abs: 2.2 10*3/uL (ref 0.7–4.0)
MCH: 29.8 pg (ref 26.0–34.0)
MCHC: 34.2 g/dL (ref 30.0–36.0)
MCV: 87 fL (ref 78.0–100.0)
Monocytes Absolute: 1.7 10*3/uL — ABNORMAL HIGH (ref 0.1–1.0)
Monocytes Relative: 8 % (ref 3–12)
Neutro Abs: 17.6 10*3/uL — ABNORMAL HIGH (ref 1.7–7.7)
Neutrophils Relative %: 81 % — ABNORMAL HIGH (ref 43–77)
Platelets: 311 10*3/uL (ref 150–400)
RBC: 5.07 MIL/uL (ref 3.87–5.11)
RDW: 12.8 % (ref 11.5–15.5)
WBC: 21.6 10*3/uL — ABNORMAL HIGH (ref 4.0–10.5)

## 2014-02-09 LAB — URINE MICROSCOPIC-ADD ON

## 2014-02-09 LAB — LIPASE, BLOOD: Lipase: 36 U/L (ref 11–59)

## 2014-02-09 MED ORDER — POTASSIUM CHLORIDE CRYS ER 20 MEQ PO TBCR
40.0000 meq | EXTENDED_RELEASE_TABLET | Freq: Once | ORAL | Status: AC
Start: 2014-02-09 — End: 2014-02-09
  Administered 2014-02-09: 40 meq via ORAL
  Filled 2014-02-09: qty 2

## 2014-02-09 MED ORDER — ONDANSETRON HCL 4 MG/2ML IJ SOLN
4.0000 mg | Freq: Once | INTRAMUSCULAR | Status: AC
  Administered 2014-02-09: 4 mg via INTRAVENOUS
  Filled 2014-02-09: qty 2

## 2014-02-09 MED ORDER — SUCRALFATE 1 G PO TABS: 1.0000 g | ORAL_TABLET | Freq: Four times a day (QID) | ORAL | Status: DC

## 2014-02-09 MED ORDER — SODIUM CHLORIDE 0.9 % IV BOLUS (SEPSIS)
1000.0000 mL | Freq: Once | INTRAVENOUS | Status: AC
  Administered 2014-02-09: 1000 mL via INTRAVENOUS

## 2014-02-09 MED ORDER — POTASSIUM CHLORIDE 10 MEQ/100ML IV SOLN
10.0000 meq | Freq: Once | INTRAVENOUS | Status: AC
  Administered 2014-02-09: 10 meq via INTRAVENOUS
  Filled 2014-02-09: qty 100

## 2014-02-09 MED ORDER — IOHEXOL 300 MG/ML  SOLN
100.0000 mL | Freq: Once | INTRAMUSCULAR | Status: AC | PRN
  Administered 2014-02-09: 100 mL via INTRAVENOUS

## 2014-02-09 MED ORDER — PROMETHAZINE HCL 25 MG/ML IJ SOLN
12.5000 mg | Freq: Once | INTRAMUSCULAR | Status: AC
  Administered 2014-02-09: 12.5 mg via INTRAVENOUS
  Filled 2014-02-09: qty 1

## 2014-02-09 MED ORDER — IOHEXOL 300 MG/ML  SOLN
50.0000 mL | Freq: Once | INTRAMUSCULAR | Status: AC | PRN
  Administered 2014-02-09: 50 mL via ORAL

## 2014-02-09 MED ORDER — HYDROMORPHONE HCL PF 1 MG/ML IJ SOLN
0.5000 mg | Freq: Once | INTRAMUSCULAR | Status: AC
  Administered 2014-02-09: 0.5 mg via INTRAVENOUS
  Filled 2014-02-09: qty 1

## 2014-02-09 NOTE — ED Provider Notes (Signed)
Patient's labs and x-rays reviewed. No evidence of intra-abdominal pathology. Patient has a history of gastritis suspect that this was likely has. Repeat abdominal exam does not show any signs of surgical abdomen. She will followup with her gastroenterology Patient to be prescribed Carafate  Alexis Jacobsen, MD 02/09/14 [phone]

## 2014-02-09 NOTE — ED Notes (Signed)
Patient transported to CT 

## 2014-02-09 NOTE — Discharge Instructions (Signed)
Hypokalemia °Hypokalemia means that the amount of potassium in the blood is lower than normal. Potassium is a chemical, called an electrolyte, that helps regulate the amount of fluid in the body. It also stimulates muscle contraction and helps nerves function properly. Most of the body's potassium is inside of cells, and only a very small amount is in the blood. Because the amount in the blood is so small, minor changes can be life-threatening. °CAUSES °· Antibiotics. °· Diarrhea or vomiting. °· Using laxatives too much, which can cause diarrhea. °· Chronic kidney disease. °· Water pills (diuretics). °· Eating disorders (bulimia). °· Low magnesium level. °· Sweating a lot. °SIGNS AND SYMPTOMS °· Weakness. °· Constipation. °· Fatigue. °· Muscle cramps. °· Mental confusion. °· Skipped heartbeats or irregular heartbeat (palpitations). °· Tingling or numbness. °DIAGNOSIS  °Your health care provider can diagnose hypokalemia with blood tests. In addition to checking your potassium level, your health care provider may also check other lab tests. °TREATMENT °Hypokalemia can be treated with potassium supplements taken by mouth or adjustments in your current medicines. If your potassium level is very low, you may need to get potassium through a vein (IV) and be monitored in the hospital. A diet high in potassium is also helpful. Foods high in potassium are: °· Nuts, such as peanuts and pistachios. °· Seeds, such as sunflower seeds and pumpkin seeds. °· Peas, lentils, and lima beans. °· Whole grain and bran cereals and breads. °· Fresh fruit and vegetables, such as apricots, avocado, bananas, cantaloupe, kiwi, oranges, tomatoes, asparagus, and potatoes. °· Orange and tomato juices. °· Red meats. °· Fruit yogurt. °HOME CARE INSTRUCTIONS °· Take all medicines as prescribed by your health care provider. °· Maintain a healthy diet by including nutritious food, such as fruits, vegetables, nuts, whole grains, and lean meats. °· If  you are taking a laxative, be sure to follow the directions on the label. °SEEK MEDICAL CARE IF: °· Your weakness gets worse. °· You feel your heart pounding or racing. °· You are vomiting or having diarrhea. °· You are diabetic and having trouble keeping your blood glucose in the normal range. °SEEK IMMEDIATE MEDICAL CARE IF: °· You have chest pain, shortness of breath, or dizziness. °· You are vomiting or having diarrhea for more than 2 days. °· You faint. °MAKE SURE YOU:  °· Understand these instructions. °· Will watch your condition. °· Will get help right away if you are not doing well or get worse. °Document Released: 07/29/2005 Document Revised: 05/19/2013 Document Reviewed: 01/29/2013 °[employer]® Patient Information ©2015 [employer]. This information is not intended to replace advice given to you by your health care provider. Make sure you discuss any questions you have with your health care provider. ° °Potassium Content of Foods °Potassium is a mineral found in many foods and drinks. It helps keep fluids and minerals balanced in your body and affects how steadily your heart beats. Potassium also helps control your blood pressure and keep your muscles and nervous system healthy. °Certain health conditions and medicines may change the balance of potassium in your body. When this happens, you can help balance your level of potassium through the foods that you do or do not eat. Your health care provider or dietitian may recommend an amount of potassium that you should have each day. The following lists of foods provide the amount of potassium (in parentheses) per serving in each item. °HIGH IN POTASSIUM  °The following foods and beverages have 200 mg or more of potassium per   serving: °· Apricots, 2 raw or 5 dry (200 mg). °· Artichoke, 1 medium (345 mg). °· Avocado, raw,  ¼ each (245 mg). °· Banana, 1 medium (425 mg). °· Beans, lima, or baked beans, canned, ½ cup (280 mg). °· Beans, white, canned, ½ cup (595  mg). °· Beef roast, 3 oz (320 mg). °· Beef, ground, 3 oz (270 mg). °· Beets, raw or cooked, ½ cup (260 mg). °· Bran muffin, 2 oz (300 mg). °· Broccoli, ½ cup (230 mg). °· Brussels sprouts, ½ cup (250 mg). °· Cantaloupe, ½ cup (215 mg). °· Cereal, 100% bran, ½ cup (200-400 mg). °· Cheeseburger, single, fast food, 1 each (225-400 mg). °· Chicken, 3 oz (220 mg). °· Clams, canned, 3 oz (535 mg). °· Crab, 3 oz (225 mg). °· Dates, 5 each (270 mg). °· Dried beans and peas, ½ cup (300-475 mg). °· Figs, dried, 2 each (260 mg). °· Fish: halibut, tuna, cod, snapper, 3 oz (480 mg). °· Fish: salmon, haddock, swordfish, perch, 3 oz (300 mg). °· Fish, tuna, canned 3 oz (200 mg). °· French fries, fast food, 3 oz (470 mg). °· Granola with fruit and nuts, ½ cup (200 mg). °· Grapefruit juice, ½ cup (200 mg). °· Greens, beet, ½ cup (655 mg). °· Honeydew melon, ½ cup (200 mg). °· Kale, raw, 1 cup (300 mg). °· Kiwi, 1 medium (240 mg). °· Kohlrabi, rutabaga, parsnips, ½ cup (280 mg). °· Lentils, ½ cup (365 mg). °· Mango, 1 each (325 mg). °· Milk, chocolate, 1 cup (420 mg). °· Milk: nonfat, low-fat, whole, buttermilk, 1 cup (350-380 mg). °· Molasses, 1 Tbsp (295 mg). °· Mushrooms, ½ cup (280) mg. °· Nectarine, 1 each (275 mg). °· Nuts: almonds, peanuts, hazelnuts, [place], cashew, mixed, 1 oz (200 mg). °· Nuts, pistachios, 1 oz (295 mg). °· Orange, 1 each (240 mg). °· Orange juice, ½ cup (235 mg). °· Papaya, medium, ½ fruit (390 mg). °· Peanut butter, chunky, 2 Tbsp (240 mg). °· Peanut butter, smooth, 2 Tbsp (210 mg). °· Pear, 1 medium (200 mg). °· Pomegranate, 1 whole (400 mg). °· Pomegranate juice, ½ cup (215 mg). °· Pork, 3 oz (350 mg). °· Potato chips, salted, 1 oz (465 mg). °· Potato, baked with skin, 1 medium (925 mg). °· Potatoes, boiled, ½ cup (255 mg). °· Potatoes, mashed, ½ cup (330 mg). °· Prune juice, ½ cup (370 mg). °· Prunes, 5 each (305 mg). °· Pudding, chocolate, ½ cup (230 mg). °· Pumpkin, canned, ½ cup (250  mg). °· Raisins, seedless, ¼ cup (270 mg). °· Seeds, sunflower or pumpkin, 1 oz (240 mg). °· Soy milk, 1 cup (300 mg). °· Spinach, ½ cup (420 mg). °· Spinach, canned, ½ cup (370 mg). °· Sweet potato, baked with skin, 1 medium (450 mg). °· Swiss chard, ½ cup (480 mg). °· Tomato or vegetable juice, ½ cup (275 mg). °· Tomato sauce or puree, ½ cup (400-550 mg). °· Tomato, raw, 1 medium (290 mg). °· Tomatoes, canned, ½ cup (200-300 mg). °· Turkey, 3 oz (250 mg). °· Wheat germ, 1 oz (250 mg). °· Winter squash, ½ cup (250 mg). °· Yogurt, plain or fruited, 6 oz (260-435 mg). °· Zucchini, ½ cup (220 mg). °MODERATE IN POTASSIUM °The following foods and beverages have 50-200 mg of potassium per serving: °· Apple, 1 each (150 mg). °· Apple juice, ½ cup (150 mg). °· Applesauce, ½ cup (90 mg). °· Apricot nectar, ½ cup (140 mg). °· Asparagus, small spears, ½ cup or 6   spears (155 mg). °· Bagel, cinnamon raisin, 1 each (130 mg). °· Bagel, egg or plain, 4 in., 1 each (70 mg). °· Beans, green, ½ cup (90 mg). °· Beans, yellow, ½ cup (190 mg). °· Beer, regular, 12 oz (100 mg). °· Beets, canned, ½ cup (125 mg). °· Blackberries, ½ cup (115 mg). °· Blueberries, ½ cup (60 mg). °· Bread, whole wheat, 1 slice (70 mg). °· Broccoli, raw, ½ cup (145 mg). °· Cabbage, ½ cup (150 mg). °· Carrots, cooked or raw, ½ cup (180 mg). °· Cauliflower, raw, ½ cup (150 mg). °· Celery, raw, ½ cup (155 mg). °· Cereal, bran flakes, ½cup (120-150 mg). °· Cheese, cottage, ½ cup (110 mg). °· Cherries, 10 each (150 mg). °· Chocolate, 1½ oz bar (165 mg). °· Coffee, brewed 6 oz (90 mg). °· Corn, ½ cup or 1 ear (195 mg). °· Cucumbers, ½ cup (80 mg). °· Egg, large, 1 each (60 mg). °· Eggplant, ½ cup (60 mg). °· Endive, raw, ½cup (80 mg). °· English muffin, 1 each (65 mg). °· Fish, orange roughy, 3 oz (150 mg). °· Frankfurter, beef or pork, 1 each (75 mg). °· Fruit cocktail, ½ cup (115 mg). °· Grape juice, ½ cup (170 mg). °· Grapefruit, ½ fruit (175 mg). °· Grapes, ½ cup  (155 mg). °· Greens: kale, turnip, collard, ½ cup (110-150 mg). °· Ice cream or frozen yogurt, chocolate, ½ cup (175 mg). °· Ice cream or frozen yogurt, vanilla, ½ cup (120-150 mg). °· Lemons, limes, 1 each (80 mg). °· Lettuce, all types, 1 cup (100 mg). °· Mixed vegetables, ½ cup (150 mg). °· Mushrooms, raw, ½ cup (110 mg). °· Nuts: walnuts, pecans, or macadamia, 1 oz (125 mg). °· Oatmeal, ½ cup (80 mg). °· Okra, ½ cup (110 mg). °· Onions, raw, ½ cup (120 mg). °· Peach, 1 each (185 mg). °· Peaches, canned, ½ cup (120 mg). °· Pears, canned, ½ cup (120 mg). °· Peas, green, frozen, ½ cup (90 mg). °· Peppers, green, ½ cup (130 mg). °· Peppers, red, ½ cup (160 mg). °· Pineapple juice, ½ cup (165 mg). °· Pineapple, fresh or canned, ½ cup (100 mg). °· Plums, 1 each (105 mg). °· Pudding, vanilla, ½ cup (150 mg). °· Raspberries, ½ cup (90 mg). °· Rhubarb, ½ cup (115 mg). °· Rice, wild, ½ cup (80 mg). °· Shrimp, 3 oz (155 mg). °· Spinach, raw, 1 cup (170 mg). °· Strawberries, ½ cup (125 mg). °· Summer squash ½ cup (175-200 mg). °· Swiss chard, raw, 1 cup (135 mg). °· Tangerines, 1 each (140 mg). °· Tea, brewed, 6 oz (65 mg). °· Turnips, ½ cup (140 mg). °· Watermelon, ½ cup (85 mg). °· Wine, red, table, 5 oz (180 mg). °· Wine, white, table, 5 oz (100 mg). °LOW IN POTASSIUM °The following foods and beverages have less than 50 mg of potassium per serving. °· Bread, white, 1 slice (30 mg). °· Carbonated beverages, 12 oz (less than 5 mg). °· Cheese, 1 oz (20-30 mg). °· Cranberries, ½ cup (45 mg). °· Cranberry juice cocktail, ½ cup (20 mg). °· Fats and oils, 1 Tbsp (less than 5 mg). °· Hummus, 1 Tbsp (32 mg). °· Nectar: papaya, mango, or pear, ½ cup (35 mg). °· Rice, white or brown, ½ cup (50 mg). °· Spaghetti or macaroni, ½ cup cooked (30 mg). °· Tortilla, flour or corn, 1 each (50 mg). °· Waffle, 4 in., 1 each (50 mg). °· Water chestnuts, ½ cup (40 mg). °  Document Released: 03/12/2005 Document Revised: 08/03/2013 Document  Reviewed: 06/25/2013 [facility] Patient Information 2015 [place], [place]. This information is not intended to replace advice given to you by your health care provider. Make sure you discuss any questions you have with your health care provider. Gastritis, Adult Gastritis is soreness and swelling (inflammation) of the lining of the stomach. Gastritis can develop as a sudden onset (acute) or long-term (chronic) condition. If gastritis is not treated, it can lead to stomach bleeding and ulcers. CAUSES  Gastritis occurs when the stomach lining is weak or damaged. Digestive juices from the stomach then inflame the weakened stomach lining. The stomach lining may be weak or damaged due to viral or bacterial infections. One common bacterial infection is the Helicobacter pylori infection. Gastritis can also result from excessive alcohol consumption, taking certain medicines, or having too much acid in the stomach.  SYMPTOMS  In some cases, there are no symptoms. When symptoms are present, they may include:  Pain or a burning sensation in the upper abdomen.  Nausea.  Vomiting.  An uncomfortable feeling of fullness after eating. DIAGNOSIS  Your caregiver may suspect you have gastritis based on your symptoms and a physical exam. To determine the cause of your gastritis, your caregiver may perform the following:  Blood or stool tests to check for the H pylori bacterium.  Gastroscopy. A thin, flexible tube (endoscope) is passed down the esophagus and into the stomach. The endoscope has a light and camera on the end. Your caregiver uses the endoscope to view the inside of the stomach.  Taking a tissue sample (biopsy) from the stomach to examine under a microscope. TREATMENT  Depending on the cause of your gastritis, medicines may be prescribed. If you have a bacterial infection, such as an H pylori infection, antibiotics may be given. If your gastritis is caused by too much acid in the stomach, H2 blockers  or antacids may be given. Your caregiver may recommend that you stop taking aspirin, ibuprofen, or other nonsteroidal anti-inflammatory drugs (NSAIDs). HOME CARE INSTRUCTIONS  Only take over-the-counter or prescription medicines as directed by your caregiver.  If you were given antibiotic medicines, take them as directed. Finish them even if you start to feel better.  Drink enough fluids to keep your urine clear or pale yellow.  Avoid foods and drinks that make your symptoms worse, such as:  Caffeine or alcoholic drinks.  Chocolate.  Peppermint or mint flavorings.  Garlic and onions.  Spicy foods.  Citrus fruits, such as oranges, lemons, or limes.  Tomato-based foods such as sauce, chili, salsa, and pizza.  Fried and fatty foods.  Eat small, frequent meals instead of large meals. SEEK IMMEDIATE MEDICAL CARE IF:   You have black or dark red stools.  You vomit blood or material that looks like coffee grounds.  You are unable to keep fluids down.  Your abdominal pain gets worse.  You have a fever.  You do not feel better after 1 week.  You have any other questions or concerns. MAKE SURE YOU:  Understand these instructions.  Will watch your condition.  Will get help right away if you are not doing well or get worse. Document Released: 07/23/2001 Document Revised: 01/28/2012 Document Reviewed: 09/11/2011 [facility] Patient Information 2015 [place], [place]. This information is not intended to replace advice given to you by your health care provider. Make sure you discuss any questions you have with your health care provider.

## 2014-02-09 NOTE — ED Notes (Signed)
Pt A+ox4, reports c/o n/v/d x6 days.  Pt reports calling PCP and given script for zofran "but i didn't even fill it, it wouldn't do anything for me".  Pt reports diffuse abd pain "from throwing up so much".  Pt reports able to tolerate small amts gingerale and crackers.  Pt denies fevers/chills.  Skin PWD.  Speaking full/clear sentences.  Abd s/nt/nd, obese.  No active vomiting.

## 2014-02-09 NOTE — ED Provider Notes (Signed)
CSN: [id number]     Arrival date & time 02/09/14  1242 History   First MD Initiated Contact with Patient 02/09/14 1302     Chief Complaint  Patient presents with  . Nausea  . Vomiting  . Diarrhea     (Consider location/radiation/quality/duration/timing/severity/associated sxs/prior Treatment) HPI  63 year old female nausea and vomiting. Onset almost a week ago. Multiple episodes. Upper abdominal pain which she attributes to vomiting so much. No sick contacts. No recent med changes or abx usage. No fever or chills. Intermittent loose stools. No blood or melena. Multiple previous abdominal surgeries including TAH, appendectomy and cholecystectomy. Followed by Dr Olevia Perches, GI. GI hx of GERD, IBS and gastritis which sounds like was related to NSAID use.   Past Medical History  Diagnosis Date  . Hx of gastritis   . GERD (gastroesophageal reflux disease)   . Irritable bowel syndrome (IBS)   . Depression   . Hypertension   . Hyperlipidemia   . Sleep apnea   . Diabetes mellitus type II   . Fibromyalgia   . Fatty liver   . Arthritis   . Anxiety   . Uterine cancer   . Hyperplastic colon polyp   . Diverticulitis   . Pancreatic cyst     peripancreatic cystic lesion  . Renal cyst   . Heart attack   . Headache(784.0) 03/18/2013  . Sprain of neck 03/18/2013  . Migraine without aura, without mention of intractable migraine without mention of status migrainosus 03/18/2013  . Obesity    Past Surgical History  Procedure Laterality Date  . Appendectomy    . Abdominal hysterectomy    . Cholecystectomy    . Sinus surgery with instatrak    . Tubal ligation    . Breast lumpectomy     Family History  Problem Relation Age of Onset  . Breast cancer Sister   . Colon cancer Maternal Uncle   . Ovarian cancer Maternal Aunt   . Stomach cancer Maternal Aunt   . Colon polyps Father   . Heart disease Father   . Diabetes Maternal Aunt   . Heart disease Maternal Uncle   . Heart disease       Grandparents  . Irritable bowel syndrome Daughter    History  Substance Use Topics  . Smoking status: Never Smoker   . Smokeless tobacco: Never Used  . Alcohol Use: No   OB History   Grav Para Term Preterm Abortions TAB SAB Ect Mult Living                 Review of Systems  All systems reviewed and negative, other than as noted in HPI.   Allergies  Aspirin; Codeine; Naproxen; and Sulfonamide derivatives  Home Medications   Prior to Admission medications   Medication Sig Start Date End Date Taking? Authorizing Provider  ALPRAZolam (XANAX) 0.25 MG tablet Take 0.25 mg by mouth 2 (two) times daily.   Yes Historical Provider, MD  atorvastatin (LIPITOR) 20 MG tablet Take 20 mg by mouth daily.   Yes Historical Provider, MD  buPROPion (WELLBUTRIN XL) 300 MG 24 hr tablet Take 300 mg by mouth daily.   Yes Historical Provider, MD  Cholecalciferol (VITAMIN D3) 1000 UNITS CAPS Take 1 capsule by mouth daily.   Yes Historical Provider, MD  estradiol (VIVELLE-DOT) 0.075 MG/24HR Place 0.5 patches onto the skin 2 (two) times a week.   Yes Historical Provider, MD  fish oil-omega-3 fatty acids 1000 MG capsule Take 1 g by  mouth daily.   Yes Historical Provider, MD  insulin glargine (LANTUS) 100 UNIT/ML injection Inject 30 Units into the skin every morning.    Yes Historical Provider, MD  irbesartan (AVAPRO) 300 MG tablet Take 300 mg by mouth daily.    Yes Historical Provider, MD  metoprolol succinate (TOPROL-XL) 50 MG 24 hr tablet Take 50 mg by mouth 2 (two) times daily. Take with or immediately following a meal.   Yes Historical Provider, MD  pregabalin (LYRICA) 50 MG capsule Take 50 mg by mouth 2 (two) times daily.   Yes Historical Provider, MD  Probiotic Product (RESTORA PO) Take 1 capsule by mouth once.   Yes Historical Provider, MD  pyridOXINE (VITAMIN B-6) 100 MG tablet Take 100 mg by mouth daily.   Yes Historical Provider, MD  RABEprazole (ACIPHEX) 20 MG tablet Take 1 tablet (20 mg total) by  mouth 2 (two) times daily. 04/22/12  Yes Lafayette Dragon, MD  thiamine (VITAMIN B-1) 100 MG tablet Take 100 mg by mouth daily.   Yes Historical Provider, MD  traMADol (ULTRAM) 50 MG tablet Take 50 mg by mouth 4 (four) times daily as needed for moderate pain. Has been taking two in the morning and one at night past few days.   Yes Historical Provider, MD  venlafaxine XR (EFFEXOR-XR) 150 MG 24 hr capsule Take 150 mg by mouth 2 (two) times daily.   Yes Historical Provider, MD  zaleplon (SONATA) 10 MG capsule Take 10 mg by mouth at bedtime.   Yes Historical Provider, MD   BP 140/75  Pulse 106  Temp(Src) 98.6 F (37 C) (Oral)  Resp 18  SpO2 92% Physical Exam  Nursing note and vitals reviewed. Constitutional: She appears well-developed and well-nourished. No distress.  HENT:  Head: Normocephalic and atraumatic.  Eyes: Conjunctivae are normal. Right eye exhibits no discharge. Left eye exhibits no discharge.  Neck: Neck supple.  Cardiovascular: Normal rate, regular rhythm and normal heart sounds.  Exam reveals no gallop and no friction rub.   No murmur heard. Pulmonary/Chest: Effort normal and breath sounds normal. No respiratory distress.  Abdominal: Soft. She exhibits no distension. There is tenderness.  Mild upper abdominal tenderness. No rebound/guarding. Doesn't seem distended. Hypoactive bowel sounds.   Musculoskeletal: She exhibits no edema and no tenderness.  Neurological: She is alert.  Skin: Skin is warm and dry.  Psychiatric: She has a normal mood and affect. Her behavior is normal. Thought content normal.    ED Course  Procedures (including critical care time) Labs Review Labs Reviewed  CBC WITH DIFFERENTIAL - Abnormal; Notable for the following:    WBC 21.6 (*)    Hemoglobin 15.1 (*)    Neutrophils Relative % 81 (*)    Neutro Abs 17.6 (*)    Lymphocytes Relative 10 (*)    Monocytes Absolute 1.7 (*)    All other components within normal limits  BASIC METABOLIC PANEL -  Abnormal; Notable for the following:    Sodium 136 (*)    Potassium 2.9 (*)    Chloride 95 (*)    Glucose, Bld 255 (*)    GFR calc non Af Amer 90 (*)    Anion gap 17 (*)    All other components within normal limits  LIPASE, BLOOD  GI PATHOGEN PANEL BY PCR, STOOL  URINALYSIS, ROUTINE W REFLEX MICROSCOPIC    Imaging Review Ct Abdomen Pelvis W Contrast  02/09/2014   CLINICAL DATA:  Nausea, vomiting and diarrhea for the past 6  days. Diffuse abdominal pain.  EXAM: CT ABDOMEN AND PELVIS WITH CONTRAST  TECHNIQUE: Multidetector CT imaging of the abdomen and pelvis was performed using the standard protocol following bolus administration of intravenous contrast.  CONTRAST:  162mL OMNIPAQUE IOHEXOL 300 MG/ML  SOLN  COMPARISON:  CT of the abdomen and pelvis 05/11/2006.  FINDINGS: Lung Bases: Calcifications of the mitral annulus.  Abdomen/Pelvis: Status post cholecystectomy. The appearance of the liver, pancreas, spleen and bilateral adrenal glands is unremarkable. 3 mm nonobstructive calculus in the lower pole collecting system of the right kidney. 1.1 cm low-attenuation lesion in the interpolar region of the left kidney is compatible with a small simple cyst.  No significant volume of ascites. No pneumoperitoneum. No pathologic distention of small bowel. No lymphadenopathy identified within the abdomen or pelvis. Atherosclerosis throughout the abdominal and pelvic vasculature, without evidence of aneurysm or dissection. Status post hysterectomy. Ovaries are not confidently identified may be surgically absent or atrophic. Urinary bladder is moderately distended, but otherwise unremarkable in appearance.  Musculoskeletal: There are no aggressive appearing lytic or blastic lesions noted in the visualized portions of the skeleton.  IMPRESSION: 1. No acute findings in the abdomen or pelvis to account for the patient's symptoms. 2. 3 mm nonobstructive calculus in the lower pole collecting system of the right kidney. 3.  1.2 cm simple cyst in the interpolar region of the left kidney. 4. Status post cholecystectomy. 5. Status post hysterectomy.   Electronically Signed   By: Vinnie Langton M.D.   On: 02/09/2014 15:50     EKG Interpretation None      MDM   Final diagnoses:  Nausea and vomiting, vomiting of unspecified type  Hypokalemia    63yF with n/v. Duration of a week not consistent with "GI bug." Afebrile. Mild tenderness on exam, but no peritonitis. Consider SBO, particularly in light of multiple previous abdominal surgeries. IVF. Labs. UA. Antiemetics/Pain meds. CT a/p.   3:29 PM Pt resting comfortably in bed. Just finished drinking contrast. Awaiting CT. Hypokalemic. Supplementation ordered. Discussed that care will be transferred to Dr Zenia Resides.     Virgel Manifold, MD 02/10/14 [phone]

## 2014-02-10 ENCOUNTER — Telehealth: Payer: Self-pay | Admitting: Internal Medicine

## 2014-02-10 LAB — GI PATHOGEN PANEL BY PCR, STOOL
C difficile toxin A/B: NEGATIVE
Campylobacter by PCR: NEGATIVE
Cryptosporidium by PCR: NEGATIVE
E coli (ETEC) LT/ST: NEGATIVE
E coli (STEC): NEGATIVE
E coli 0157 by PCR: NEGATIVE
G lamblia by PCR: NEGATIVE
Norovirus GI/GII: NEGATIVE
Rotavirus A by PCR: NEGATIVE
Salmonella by PCR: NEGATIVE
Shigella by PCR: NEGATIVE

## 2014-02-10 NOTE — Telephone Encounter (Signed)
Unable to reach patient. Phone rings but no answer. Will try again later. 

## 2014-02-10 NOTE — Telephone Encounter (Signed)
Left a message for patient to call me back.

## 2014-02-14 NOTE — Telephone Encounter (Signed)
Left message again.

## 2014-02-15 NOTE — Telephone Encounter (Signed)
Left message again.

## 2014-02-16 NOTE — Telephone Encounter (Signed)
Unable to reach patient x 3 days.

## 2014-02-22 ENCOUNTER — Other Ambulatory Visit (INDEPENDENT_AMBULATORY_CARE_PROVIDER_SITE_OTHER): Payer: 59

## 2014-02-22 ENCOUNTER — Ambulatory Visit (INDEPENDENT_AMBULATORY_CARE_PROVIDER_SITE_OTHER): Payer: 59 | Admitting: Internal Medicine

## 2014-02-22 ENCOUNTER — Encounter: Payer: Self-pay | Admitting: Internal Medicine

## 2014-02-22 VITALS — BP 134/62 | HR 70 | Ht 65.0 in | Wt 186.0 lb

## 2014-02-22 DIAGNOSIS — R112 Nausea with vomiting, unspecified: Secondary | ICD-10-CM

## 2014-02-22 LAB — BASIC METABOLIC PANEL
BUN: 14 mg/dL (ref 6–23)
CO2: 30 mEq/L (ref 19–32)
Calcium: 9.9 mg/dL (ref 8.4–10.5)
Chloride: 97 mEq/L (ref 96–112)
Creatinine, Ser: 0.7 mg/dL (ref 0.4–1.2)
GFR: 96.29 mL/min (ref >60.00)
Glucose, Bld: 179 mg/dL — ABNORMAL HIGH (ref 70–99)
Potassium: 4.4 mEq/L (ref 3.5–5.1)
Sodium: 139 mEq/L (ref 135–145)

## 2014-02-22 LAB — CBC WITH DIFFERENTIAL/PLATELET
Basophils Absolute: 0 10*3/uL (ref 0.0–0.1)
Basophils Relative: 0.3 % (ref 0.0–3.0)
Eosinophils Absolute: 0 10*3/uL (ref 0.0–0.7)
Eosinophils Relative: 0 % (ref 0.0–5.0)
HCT: 44 % (ref 36.0–46.0)
Hemoglobin: 14.5 g/dL (ref 12.0–15.0)
Lymphocytes Relative: 28.3 % (ref 12.0–46.0)
Lymphs Abs: 2.6 10*3/uL (ref 0.7–4.0)
MCHC: 33 g/dL (ref 30.0–36.0)
MCV: 89 fl (ref 78.0–100.0)
Monocytes Absolute: 0.9 10*3/uL (ref 0.1–1.0)
Monocytes Relative: 9.6 % (ref 3.0–12.0)
Neutro Abs: 5.7 10*3/uL (ref 1.4–7.7)
Neutrophils Relative %: 61.8 % (ref 43.0–77.0)
Platelets: 260 10*3/uL (ref 150.0–400.0)
RBC: 4.94 Mil/uL (ref 3.87–5.11)
RDW: 13.8 % (ref 11.5–15.5)
WBC: 9.2 10*3/uL (ref 4.0–10.5)

## 2014-02-22 MED ORDER — SUCRALFATE 1 GM/10ML PO SUSP: 1.0000 g | Freq: Two times a day (BID) | ORAL | Status: DC

## 2014-02-22 MED ORDER — METRONIDAZOLE 250 MG PO TABS: 250.0000 mg | ORAL_TABLET | Freq: Three times a day (TID) | ORAL | Status: DC

## 2014-02-22 NOTE — Progress Notes (Addendum)
GAYLEN PEREIRA [birth year] [id number]  Note: This dictation was prepared with Dragon digital system. Any transcriptional errors that result from this procedure are unintentional.   History of Present Illness:  This is a 63 year old white female insulin-dependent diabetic with peripheral neuropathy and recent nausea, vomiting and diarrhea. In the past, we've seen her for constipation. She has fatty liver and mild abnormalities of liver function tests. She was seen in the emergency room within the last 2 weeks with severe nausea and vomiting. Her potassium was 2.9. Her stool for pathogens was negative. She has been on AcipHex 20 mg twice a day. A CT scan of the abdomen on 02/09/2014 showed no acute findings. Her last colonoscopy in February 2012 was normal. She has a family history of colon cancer. Her last endoscopy in September 2013 showed gastritis. She had a prior laparoscopic cholecystectomy in 1990.    Past Medical History  Diagnosis Date  . Hx of gastritis   . GERD (gastroesophageal reflux disease)   . Irritable bowel syndrome (IBS)   . Depression   . Hypertension   . Hyperlipidemia   . Sleep apnea   . Diabetes mellitus type II   . Fibromyalgia   . Fatty liver   . Arthritis   . Anxiety   . Hyperplastic colon polyp   . Diverticulitis   . Pancreatic cyst     peripancreatic cystic lesion  . Renal cyst   . Heart attack   . Headache(784.0) 03/18/2013  . Sprain of neck 03/18/2013  . Migraine without aura, without mention of intractable migraine without mention of status migrainosus 03/18/2013  . Obesity   . Uterine cancer dx'd 2000    surg only    Past Surgical History  Procedure Laterality Date  . Appendectomy    . Abdominal hysterectomy    . Cholecystectomy    . Sinus surgery with instatrak    . Tubal ligation    . Breast lumpectomy      Allergies  Allergen Reactions  . Aspirin     Reaction: Vasculitis per MD  . Codeine     REACTION: "makes her itch"  . Naproxen      Reaction: Makes stomach upset/irritated.  . Sulfonamide Derivatives Rash    REACTION: itching    Family history and social history have been reviewed.  Review of Systems: Positive for heartburn. Early satiety. Diarrhea  The remainder of the 10 point ROS is negative except as outlined in the H&P  Physical Exam: General Appearance Well developed, in no distress Eyes  Non icteric  HEENT  Non traumatic, normocephalic  Mouth No lesion, tongue papillated, no cheilosis Neck Supple without adenopathy, thyroid not enlarged, no carotid bruits, no JVD Lungs Clear to auscultation bilaterally COR Normal S1, normal S2, regular rhythm, no murmur, quiet precordium Abdomen mildly obese. Soft diffusely tender more so in the left lower quadrant. Liver edge at costal margin. No ascites Rectal normal rectal sphincter tone. Hemoccult negative stool Extremities  No pedal edema Skin No lesions Neurological Alert and oriented x 3 Psychological Normal mood and affect  Assessment and Plan:   Problem #35 63 year old white female with acute episodes of nausea, vomiting and diarrhea suggestive of infectious gastroenteritis. The symptoms have been present now for over 2 weeks, making it less likely for this to be infectious. I am suspecting diabetic gastroparesis and bacterial overgrowth diarrhea. We will start her on Flagyl 250 mg 3 times a day and add Carafate slurry 10 cc by mouth twice  a day. She will continue AcipHex 20 mg twice a day. We will obtain a gastric emptying scan and based on the results, we'll decide if a repeat upper endoscopy would be indicated. We are rechecking her potassium as well as CBC. We were going to start low-dose Reglan, but it interferes with her Effexor, running a risk of extrapyramidal side effects.    Delfin Edis 02/22/2014

## 2014-02-22 NOTE — Patient Instructions (Addendum)
You have been scheduled for a gastric emptying scan at [facility] on 03/04/14 at 7:30 am. Please arrive at least 15 minutes prior to your appointment for registration. Please make certain not to have anything to eat or drink after midnight the night before your test. Hold all stomach medications (ex: Zofran, phenergan, Reglan) 48 hours prior to your test. If you need to reschedule your appointment, please contact radiology scheduling at [phone]. _____________________________________________________________________ A gastric-emptying study measures how long it takes for food to move through your stomach. There are several ways to measure stomach emptying. In the most common test, you eat food that contains a small amount of radioactive material. A scanner that detects the movement of the radioactive material is placed over your abdomen to monitor the rate at which food leaves your stomach. This test normally takes about 2 hours to complete. _____________________________________________________________________  Your physician has requested that you go to the basement for the following lab work before leaving today: CBC, BMET  We have sent the following medications to your pharmacy for you to pick up at your convenience: Carafate 10 cc twice daily Flagyl 250 mg three times daily x 7 days  CC: Dr Wilson Singer

## 2014-03-04 ENCOUNTER — Other Ambulatory Visit: Payer: Self-pay | Admitting: *Deleted

## 2014-03-04 ENCOUNTER — Encounter (HOSPITAL_COMMUNITY)
Admission: RE | Admit: 2014-03-04 | Discharge: 2014-03-04 | Disposition: A | Discharge status: Home / Self Care | Payer: 59 | Source: Ambulatory Visit | Attending: Internal Medicine | Admitting: Internal Medicine

## 2014-03-04 ENCOUNTER — Telehealth: Payer: Self-pay | Admitting: *Deleted

## 2014-03-04 DIAGNOSIS — R6881 Early satiety: Secondary | ICD-10-CM | POA: Insufficient documentation

## 2014-03-04 DIAGNOSIS — R112 Nausea with vomiting, unspecified: Secondary | ICD-10-CM | POA: Insufficient documentation

## 2014-03-04 MED ORDER — TECHNETIUM TC 99M SULFUR COLLOID
2.2000 | Freq: Once | INTRAVENOUS | Status: AC | PRN
  Administered 2014-03-04: 2.2 via INTRAVENOUS

## 2014-03-04 NOTE — Telephone Encounter (Signed)
Called patient with GES results. When ordering Reglan, get a high warning of interaction with Effexor that patient is taking. Can cause serotonin syndrome with extrapyramidal movements. Do you still want patient on Reglan 5 mg po TID AC? Please, advise.

## 2014-03-05 NOTE — Telephone Encounter (Signed)
Yes, please try low dose reglan as ordered.

## 2014-03-07 MED ORDER — METOCLOPRAMIDE HCL 5 MG PO TABS: 5.0000 mg | ORAL_TABLET | Freq: Three times a day (TID) | ORAL | Status: DC

## 2014-03-07 NOTE — Telephone Encounter (Signed)
Rx sent. Left a message for patient to call me. 

## 2014-03-07 NOTE — Telephone Encounter (Signed)
Patient notified of recommendations. 

## 2014-05-25 ENCOUNTER — Observation Stay (HOSPITAL_COMMUNITY)
Admission: EM | Admit: 2014-05-25 | Discharge: 2014-05-26 | Disposition: A | Discharge status: Home / Self Care | Payer: 59 | Attending: Internal Medicine | Admitting: Internal Medicine

## 2014-05-25 ENCOUNTER — Emergency Department (HOSPITAL_COMMUNITY): Payer: 59

## 2014-05-25 ENCOUNTER — Encounter (HOSPITAL_COMMUNITY): Payer: Self-pay | Admitting: Emergency Medicine

## 2014-05-25 DIAGNOSIS — Z6830 Body mass index (BMI) 30.0-30.9, adult: Secondary | ICD-10-CM | POA: Diagnosis not present

## 2014-05-25 DIAGNOSIS — IMO0002 Reserved for concepts with insufficient information to code with codable children: Secondary | ICD-10-CM

## 2014-05-25 DIAGNOSIS — I2510 Atherosclerotic heart disease of native coronary artery without angina pectoris: Secondary | ICD-10-CM | POA: Insufficient documentation

## 2014-05-25 DIAGNOSIS — F32A Depression, unspecified: Secondary | ICD-10-CM | POA: Diagnosis present

## 2014-05-25 DIAGNOSIS — Z23 Encounter for immunization: Secondary | ICD-10-CM | POA: Diagnosis not present

## 2014-05-25 DIAGNOSIS — F329 Major depressive disorder, single episode, unspecified: Secondary | ICD-10-CM | POA: Insufficient documentation

## 2014-05-25 DIAGNOSIS — K219 Gastro-esophageal reflux disease without esophagitis: Secondary | ICD-10-CM | POA: Diagnosis not present

## 2014-05-25 DIAGNOSIS — Z79899 Other long term (current) drug therapy: Secondary | ICD-10-CM | POA: Insufficient documentation

## 2014-05-25 DIAGNOSIS — R079 Chest pain, unspecified: Secondary | ICD-10-CM | POA: Diagnosis present

## 2014-05-25 DIAGNOSIS — R072 Precordial pain: Secondary | ICD-10-CM | POA: Diagnosis not present

## 2014-05-25 DIAGNOSIS — R0602 Shortness of breath: Secondary | ICD-10-CM | POA: Diagnosis not present

## 2014-05-25 DIAGNOSIS — I10 Essential (primary) hypertension: Secondary | ICD-10-CM | POA: Diagnosis present

## 2014-05-25 DIAGNOSIS — G4733 Obstructive sleep apnea (adult) (pediatric): Secondary | ICD-10-CM | POA: Diagnosis not present

## 2014-05-25 DIAGNOSIS — E1140 Type 2 diabetes mellitus with diabetic neuropathy, unspecified: Secondary | ICD-10-CM | POA: Insufficient documentation

## 2014-05-25 DIAGNOSIS — E119 Type 2 diabetes mellitus without complications: Secondary | ICD-10-CM | POA: Diagnosis present

## 2014-05-25 DIAGNOSIS — Z794 Long term (current) use of insulin: Secondary | ICD-10-CM | POA: Diagnosis not present

## 2014-05-25 DIAGNOSIS — F419 Anxiety disorder, unspecified: Secondary | ICD-10-CM | POA: Insufficient documentation

## 2014-05-25 DIAGNOSIS — E785 Hyperlipidemia, unspecified: Secondary | ICD-10-CM | POA: Diagnosis not present

## 2014-05-25 DIAGNOSIS — E669 Obesity, unspecified: Secondary | ICD-10-CM | POA: Diagnosis not present

## 2014-05-25 DIAGNOSIS — R739 Hyperglycemia, unspecified: Secondary | ICD-10-CM

## 2014-05-25 DIAGNOSIS — E1165 Type 2 diabetes mellitus with hyperglycemia: Secondary | ICD-10-CM | POA: Diagnosis not present

## 2014-05-25 HISTORY — DX: Other chronic pain: G89.29

## 2014-05-25 HISTORY — DX: Personal history of other diseases of the digestive system: Z87.19

## 2014-05-25 HISTORY — DX: Arteritis, unspecified: I77.6

## 2014-05-25 HISTORY — DX: Dorsalgia, unspecified: M54.9

## 2014-05-25 HISTORY — DX: Other bursitis of knee, unspecified knee: M70.50

## 2014-05-25 HISTORY — DX: Gastroparesis: K31.84

## 2014-05-25 LAB — BASIC METABOLIC PANEL
Anion gap: 16 — ABNORMAL HIGH (ref 5–15)
BUN: 14 mg/dL (ref 6–23)
CO2: 26 mEq/L (ref 19–32)
Calcium: 9.4 mg/dL (ref 8.4–10.5)
Chloride: 95 mEq/L — ABNORMAL LOW (ref 96–112)
Creatinine, Ser: 0.73 mg/dL (ref 0.50–1.10)
GFR calc Af Amer: >90 mL/min (ref >90)
GFR calc non Af Amer: 90 mL/min — ABNORMAL LOW (ref >90)
Glucose, Bld: 382 mg/dL — ABNORMAL HIGH (ref 70–99)
Potassium: 4.1 mEq/L (ref 3.7–5.3)
Sodium: 137 mEq/L (ref 137–147)

## 2014-05-25 LAB — GLUCOSE, CAPILLARY
Glucose-Capillary: 186 mg/dL — ABNORMAL HIGH (ref 70–99)
Glucose-Capillary: 140 mg/dL — ABNORMAL HIGH (ref 70–99)

## 2014-05-25 LAB — CBC
HCT: 45 % (ref 36.0–46.0)
HCT: 39.2 % (ref 36.0–46.0)
Hemoglobin: 15.2 g/dL — ABNORMAL HIGH (ref 12.0–15.0)
Hemoglobin: 13.4 g/dL (ref 12.0–15.0)
MCH: 30.2 pg (ref 26.0–34.0)
MCH: 29.5 pg (ref 26.0–34.0)
MCHC: 33.8 g/dL (ref 30.0–36.0)
MCHC: 34.2 g/dL (ref 30.0–36.0)
MCV: 89.3 fL (ref 78.0–100.0)
MCV: 86.2 fL (ref 78.0–100.0)
Platelets: 239 10*3/uL (ref 150–400)
Platelets: 215 10*3/uL (ref 150–400)
RBC: 5.04 MIL/uL (ref 3.87–5.11)
RBC: 4.55 MIL/uL (ref 3.87–5.11)
RDW: 13.5 % (ref 11.5–15.5)
RDW: 13.5 % (ref 11.5–15.5)
WBC: 8 10*3/uL (ref 4.0–10.5)
WBC: 6.3 10*3/uL (ref 4.0–10.5)

## 2014-05-25 LAB — CREATININE, SERUM
Creatinine, Ser: 0.69 mg/dL (ref 0.50–1.10)
GFR calc Af Amer: >90 mL/min (ref >90)
GFR calc non Af Amer: >90 mL/min (ref >90)

## 2014-05-25 LAB — I-STAT TROPONIN, ED: Troponin i, poc: 0.02 ng/mL (ref 0.00–0.08)

## 2014-05-25 LAB — TROPONIN I: Troponin I: <0.3 ng/mL (ref <0.30)

## 2014-05-25 LAB — D-DIMER, QUANTITATIVE: D-Dimer, Quant: <0.27 ug/mL-FEU (ref 0.00–0.48)

## 2014-05-25 LAB — PRO B NATRIURETIC PEPTIDE: Pro B Natriuretic peptide (BNP): 67.3 pg/mL (ref 0–125)

## 2014-05-25 MED ORDER — BUPROPION HCL ER (XL) 300 MG PO TB24
300.0000 mg | ORAL_TABLET | Freq: Every day | ORAL | Status: DC
Start: 2014-05-26 — End: 2014-05-26
  Administered 2014-05-26: 300 mg via ORAL
  Filled 2014-05-25: qty 1

## 2014-05-25 MED ORDER — NITROGLYCERIN 0.4 MG SL SUBL
0.4000 mg | SUBLINGUAL_TABLET | SUBLINGUAL | Status: AC | PRN
  Administered 2014-05-25 (×3): 0.4 mg via SUBLINGUAL
  Filled 2014-05-25: qty 1

## 2014-05-25 MED ORDER — GI COCKTAIL ~~LOC~~: 30.0000 mL | Freq: Four times a day (QID) | ORAL | Status: DC | PRN

## 2014-05-25 MED ORDER — ENOXAPARIN SODIUM 40 MG/0.4ML ~~LOC~~ SOLN
40.0000 mg | SUBCUTANEOUS | Status: DC
Start: 2014-05-25 — End: 2014-05-26
  Administered 2014-05-25: 40 mg via SUBCUTANEOUS
  Filled 2014-05-25 (×3): qty 0.4

## 2014-05-25 MED ORDER — INSULIN GLARGINE 100 UNIT/ML ~~LOC~~ SOLN
20.0000 [IU] | Freq: Every day | SUBCUTANEOUS | Status: DC
  Administered 2014-05-26: 20 [IU] via SUBCUTANEOUS
  Filled 2014-05-25 (×2): qty 0.2

## 2014-05-25 MED ORDER — ATORVASTATIN CALCIUM 20 MG PO TABS
20.0000 mg | ORAL_TABLET | Freq: Every day | ORAL | Status: DC
Start: 2014-05-26 — End: 2014-05-26
  Administered 2014-05-26: 20 mg via ORAL
  Filled 2014-05-25: qty 1

## 2014-05-25 MED ORDER — INSULIN ASPART 100 UNIT/ML ~~LOC~~ SOLN: 0.0000 [IU] | Freq: Every day | SUBCUTANEOUS | Status: DC

## 2014-05-25 MED ORDER — PREGABALIN 50 MG PO CAPS
50.0000 mg | ORAL_CAPSULE | Freq: Two times a day (BID) | ORAL | Status: DC
  Administered 2014-05-25 – 2014-05-26 (×2): 50 mg via ORAL
  Filled 2014-05-25 (×2): qty 1

## 2014-05-25 MED ORDER — ONDANSETRON HCL 4 MG/2ML IJ SOLN: 4.0000 mg | Freq: Four times a day (QID) | INTRAMUSCULAR | Status: DC | PRN

## 2014-05-25 MED ORDER — MORPHINE SULFATE 2 MG/ML IJ SOLN: 2.0000 mg | INTRAMUSCULAR | Status: DC | PRN

## 2014-05-25 MED ORDER — ALPRAZOLAM 0.5 MG PO TABS
0.5000 mg | ORAL_TABLET | Freq: Every day | ORAL | Status: DC
  Administered 2014-05-25: 0.5 mg via ORAL
  Filled 2014-05-25: qty 1

## 2014-05-25 MED ORDER — SODIUM CHLORIDE 0.9 % IV BOLUS (SEPSIS)
500.0000 mL | Freq: Once | INTRAVENOUS | Status: AC
  Administered 2014-05-25: 500 mL via INTRAVENOUS

## 2014-05-25 MED ORDER — INSULIN ASPART 100 UNIT/ML ~~LOC~~ SOLN: 0.0000 [IU] | Freq: Three times a day (TID) | SUBCUTANEOUS | Status: DC

## 2014-05-25 MED ORDER — VENLAFAXINE HCL ER 150 MG PO CP24
150.0000 mg | ORAL_CAPSULE | Freq: Two times a day (BID) | ORAL | Status: DC
  Administered 2014-05-25 – 2014-05-26 (×2): 150 mg via ORAL
  Filled 2014-05-25 (×3): qty 1

## 2014-05-25 MED ORDER — ACETAMINOPHEN 500 MG PO TABS
500.0000 mg | ORAL_TABLET | Freq: Four times a day (QID) | ORAL | Status: DC | PRN
  Administered 2014-05-26: 500 mg via ORAL
  Filled 2014-05-25: qty 1

## 2014-05-25 MED ORDER — IRBESARTAN 300 MG PO TABS
300.0000 mg | ORAL_TABLET | Freq: Every day | ORAL | Status: DC
  Administered 2014-05-26: 300 mg via ORAL
  Filled 2014-05-25: qty 1

## 2014-05-25 MED ORDER — INFLUENZA VAC SPLIT QUAD 0.5 ML IM SUSY
0.5000 mL | PREFILLED_SYRINGE | INTRAMUSCULAR | Status: AC
  Administered 2014-05-26: 0.5 mL via INTRAMUSCULAR
  Filled 2014-05-25 (×2): qty 0.5

## 2014-05-25 NOTE — H&P (Signed)
History and Physical  Alexis DONALSON ION:[id number] DOB: 05-20-1951 DOA: 05/25/2014  Referring physician: Glendell [name], ER PA PCP: Alexis Bolt, MD   Chief Complaint: Chest pain  HPI: Alexis [name] is a 63 y.o. female  Past medical history of CAD, obstructive sleep apnea and diabetes mellitus plus hypertension who started having intermittent chest discomfort a week ago. It was described as somewhat sharp somewhat pressure all over the left breast, but otherwise nonradiating. It was associated with some shortness of breath. Initially she did not seek help as this was intermittent. This was not associated with activity. In the last day, it has been much more consistent and continuous. Patient also started on very lightheaded. She came in to the emergency room today received 3 nitroglycerin, this helped almost fully relieve her symptoms. Initial troponin and EKG were unrevealing. Hospitalists were called for further evaluation and admission.   Review of Systems:  Patient seen after arrival to floor Pt complains of some mild left sided chest soreness. Denies any headaches, vision changes, dysphagia, palpitations, shortness of breath-currently, wheeze, cough, abdominal pain, hematuria, dysuria, constipation, diarrhea, focal extremity numbness or weakness or pain. Review of systems is otherwise negative..   Past Medical History  Diagnosis Date  . Hx of gastritis   . GERD (gastroesophageal reflux disease)   . Irritable bowel syndrome (IBS)   . Depression   . Hypertension   . Hyperlipidemia   . Fatty liver   . Anxiety   . Hyperplastic colon polyp   . Diverticulitis   . Pancreatic cyst     peripancreatic cystic lesion  . Renal cyst   . Sprain of neck 03/18/2013  . Obesity   . Uterine cancer dx'd 2000    surg only  . Heart attack 2014    "mild"  . Vasculitis     "irritates my legs"  . Diabetes mellitus type II   . Sleep apnea     "wore mask; took it off in my sleep;  quit wearing it" (05/25/2014)  . H/O hiatal hernia   . Gastroparesis     "recently dx'd" (05/25/2014)  . KGMWNUUV(253.6)     "monthly" (05/25/2014)  . Migraine without aura, without mention of intractable migraine without mention of status migrainosus     "related to allergies; have them in the spring and fall" (05/25/2014)  . Arthritis     "knees" (05/25/2014)  . Bursitis of knee     "both"  . Fibromyalgia   . Chronic back pain    Past Surgical History  Procedure Laterality Date  . Sinus surgery with instatrak  2000  . Appendectomy  ~ 1967  . Laparoscopic cholecystectomy  1990  . Breast cyst excision Right 1990  . Vaginal hysterectomy  2000  . Tubal ligation  ~ 1982   Social History:  reports that she has never smoked. She has never used smokeless tobacco. She reports that she does not drink alcohol or use illicit drugs. Patient lives at home with her husband & is able to participate in activities of daily living with out assistance  Allergies  Allergen Reactions  . Aspirin     Reaction: Vasculitis per MD  . Codeine     REACTION: "makes her itch"  . Naproxen     Reaction: Makes stomach upset/irritated.  . Sulfonamide Derivatives Rash    REACTION: itching    Family History  Problem Relation Age of Onset  . Breast cancer Sister   . Colon cancer Maternal Uncle   .  Ovarian cancer Maternal Aunt   . Stomach cancer Maternal Aunt   . Colon polyps Father   . Heart disease Father   . Diabetes Maternal Aunt   . Heart disease Maternal Uncle   . Heart disease      Grandparents  . Irritable bowel syndrome Daughter     Discussed with patient  Prior to Admission medications   Medication Sig Start Date End Date Taking? Authorizing Provider  acetaminophen (TYLENOL) 500 MG tablet Take 500 mg by mouth every 6 (six) hours as needed for headache.   Yes Historical Provider, MD  Albiglutide (TANZEUM) 30 MG PEN Inject 30 mg into the skin every 7 (seven) days. Takes on sundays   Yes  Historical Provider, MD  ALPRAZolam Duanne Moron) 0.5 MG tablet Take 0.5 mg by mouth at bedtime.   Yes Historical Provider, MD  atorvastatin (LIPITOR) 20 MG tablet Take 20 mg by mouth daily.   Yes Historical Provider, MD  buPROPion (WELLBUTRIN XL) 300 MG 24 hr tablet Take 300 mg by mouth daily.   Yes Historical Provider, MD  Canagliflozin (INVOKANA) 300 MG TABS Take 300 mg by mouth daily.   Yes Historical Provider, MD  Cholecalciferol (VITAMIN D3) 1000 UNITS CAPS Take 1 capsule by mouth at bedtime.    Yes Historical Provider, MD  estradiol (VIVELLE-DOT) 0.075 MG/24HR Place 0.5 patches onto the skin 2 (two) times a week. Wednesdays and saturdays   Yes Historical Provider, MD  fish oil-omega-3 fatty acids 1000 MG capsule Take 1 g by mouth at bedtime.    Yes Historical Provider, MD  insulin glargine (LANTUS) 100 UNIT/ML injection Inject 20 Units into the skin every morning.    Yes Historical Provider, MD  irbesartan (AVAPRO) 300 MG tablet Take 300 mg by mouth daily.    Yes Historical Provider, MD  loratadine (CLARITIN) 10 MG tablet Take 10 mg by mouth daily.   Yes Historical Provider, MD  metoCLOPramide (REGLAN) 5 MG tablet Take 1 tablet (5 mg total) by mouth 3 (three) times daily before meals. 03/07/14  Yes Lafayette Dragon, MD  metoprolol succinate (TOPROL-XL) 50 MG 24 hr tablet Take 50 mg by mouth daily. Take with or immediately following a meal.   Yes Historical Provider, MD  pregabalin (LYRICA) 50 MG capsule Take 50 mg by mouth 2 (two) times daily.   Yes Historical Provider, MD  Probiotic Product (RESTORA PO) Take 1 capsule by mouth daily.    Yes Historical Provider, MD  pyridOXINE (VITAMIN B-6) 100 MG tablet Take 100 mg by mouth at bedtime.    Yes Historical Provider, MD  RABEprazole (ACIPHEX) 20 MG tablet Take 1 tablet (20 mg total) by mouth 2 (two) times daily. 04/22/12  Yes Lafayette Dragon, MD  thiamine (VITAMIN B-1) 100 MG tablet Take 100 mg by mouth at bedtime.    Yes Historical Provider, MD  traMADol  (ULTRAM) 50 MG tablet Take 100 mg by mouth 2 (two) times daily. Has been taking two in the morning and one at night past few days.   Yes Historical Provider, MD  venlafaxine XR (EFFEXOR-XR) 150 MG 24 hr capsule Take 150 mg by mouth 2 (two) times daily.   Yes Historical Provider, MD  zaleplon (SONATA) 5 MG capsule Take 5 mg by mouth at bedtime.   Yes Historical Provider, MD    Physical Exam: BP 134/62  Pulse 77  Temp(Src) 98.6 F (37 C) (Oral)  Resp 18  Ht 5\' 5"  (1.651 m)  Wt 83.689 kg (  184 lb 8 oz)  BMI 30.70 kg/m2  SpO2 95%  General:  Alert and oriented x3, no acute distress Eyes: Sclera nonicteric, extraocular movements are intact ENT: Normocephalic, atraumatic, mucous membranes are dry Neck: No JVD Cardiovascular: Regular rate and rhythm, S1-S2 Respiratory: Clear to auscultation bilaterally Abdomen: Soft, obese, nontender, positive bowel sounds Skin: No skin breaks, tears or lesions Musculoskeletal: No clubbing or cyanosis or edema Psychiatric: Patient is appropriate, no evidence of psychoses Neurologic: No focal deficits           Labs on Admission:  Basic Metabolic Panel:  Recent Labs Lab 05/25/14 1300  NA 137  K 4.1  CL 95*  CO2 26  GLUCOSE 382*  BUN 14  CREATININE 0.73  CALCIUM 9.4   Liver Function Tests: No results found for this basename: AST, ALT, ALKPHOS, BILITOT, PROT, ALBUMIN,  in the last 168 hours No results found for this basename: LIPASE, AMYLASE,  in the last 168 hours No results found for this basename: AMMONIA,  in the last 168 hours CBC:  Recent Labs Lab 05/25/14 1300  WBC 8.0  HGB 15.2*  HCT 45.0  MCV 89.3  PLT 239   Cardiac Enzymes: No results found for this basename: CKTOTAL, CKMB, CKMBINDEX, TROPONINI,  in the last 168 hours  BNP (last 3 results)  Recent Labs  05/25/14 1300  PROBNP 67.3   CBG:  Recent Labs Lab 05/25/14 1819  GLUCAP 186*    Radiological Exams on Admission: Dg Chest 2 View  05/25/2014     IMPRESSION: Negative, no acute cardiopulmonary abnormality.   Electronically Signed   By: Lars Pinks M.D.   On: 05/25/2014 13:22    EKG: Independently reviewed. Normal sinus rhythm with incomplete right bundle branch block  Assessment/Plan Present on Admission:   . DM (diabetes mellitus), type 2, uncontrolled: Patient did not take her Lantus today. Will give it to night plus sliding-scale.  Marland Kitchen Hyperlipidemia: Continue statin.  . Depression: Continue antidepressants.  . Essential hypertension: Continue home meds.  . Obesity (BMI 30-39.9): Patient with criteria of BMI of 30.  Marland Kitchen Precordial chest pain: Principal problem. Lower risk. Cycle enzymes. Discussed with cardiology tomorrow to see if further testing needed.  Consultants: Discuss with cardiology in the morning  Code Status: Full code  Family Communication: Husband not present   Disposition Plan: Following completion of cardiac workup, possible discharge home tomorrow if negative  Time spent: 35 minutes  [facility] Pager [phone]

## 2014-05-25 NOTE — ED Provider Notes (Signed)
CSN: [id number]     Arrival date & time 05/25/14  1250 History   First MD Initiated Contact with Patient 05/25/14 1325     Chief Complaint  Patient presents with  . Chest Pain     (Consider location/radiation/quality/duration/timing/severity/associated sxs/prior Treatment) HPI Comments: Pt states that she has been having left sided chest pain over the last week. Pt states that the symptoms are more intense and consistent since last night. Sob with exertion, as well as pain. Denies vomiting, cough, fever and diaphoresis. Has had some nausea. Pt states that nothing makes the pain better or worse. Is sharp in nature. Pt states she feels like she is tender with movement today.   The history is provided by the patient. No language interpreter was used.    Past Medical History  Diagnosis Date  . Hx of gastritis   . GERD (gastroesophageal reflux disease)   . Irritable bowel syndrome (IBS)   . Depression   . Hypertension   . Hyperlipidemia   . Sleep apnea   . Diabetes mellitus type II   . Fibromyalgia   . Fatty liver   . Arthritis   . Anxiety   . Hyperplastic colon polyp   . Diverticulitis   . Pancreatic cyst     peripancreatic cystic lesion  . Renal cyst   . Heart attack   . Headache(784.0) 03/18/2013  . Sprain of neck 03/18/2013  . Migraine without aura, without mention of intractable migraine without mention of status migrainosus 03/18/2013  . Obesity   . Uterine cancer dx'd 2000    surg only   Past Surgical History  Procedure Laterality Date  . Appendectomy    . Abdominal hysterectomy    . Cholecystectomy    . Sinus surgery with instatrak    . Tubal ligation    . Breast lumpectomy     Family History  Problem Relation Age of Onset  . Breast cancer Sister   . Colon cancer Maternal Uncle   . Ovarian cancer Maternal Aunt   . Stomach cancer Maternal Aunt   . Colon polyps Father   . Heart disease Father   . Diabetes Maternal Aunt   . Heart disease Maternal Uncle   .  Heart disease      Grandparents  . Irritable bowel syndrome Daughter    History  Substance Use Topics  . Smoking status: Never Smoker   . Smokeless tobacco: Never Used  . Alcohol Use: No   OB History   Grav Para Term Preterm Abortions TAB SAB Ect Mult Living                 Review of Systems  All other systems reviewed and are negative.     Allergies  Aspirin; Codeine; Naproxen; and Sulfonamide derivatives  Home Medications   Prior to Admission medications   Medication Sig Start Date End Date Taking? Authorizing Provider  Albiglutide (TANZEUM) 30 MG PEN Inject 30 mg into the skin every 7 (seven) days. Takes on Tuesdays.    Historical Provider, MD  ALPRAZolam Duanne Moron) 0.25 MG tablet Take 0.25 mg by mouth 2 (two) times daily.    Historical Provider, MD  atorvastatin (LIPITOR) 20 MG tablet Take 20 mg by mouth daily.    Historical Provider, MD  buPROPion (WELLBUTRIN XL) 300 MG 24 hr tablet Take 300 mg by mouth daily.    Historical Provider, MD  Cholecalciferol (VITAMIN D3) 1000 UNITS CAPS Take 1 capsule by mouth daily.  Historical Provider, MD  estradiol (VIVELLE-DOT) 0.075 MG/24HR Place 0.5 patches onto the skin 2 (two) times a week.    Historical Provider, MD  fish oil-omega-3 fatty acids 1000 MG capsule Take 1 g by mouth daily.    Historical Provider, MD  insulin glargine (LANTUS) 100 UNIT/ML injection Inject 30 Units into the skin every morning.     Historical Provider, MD  irbesartan (AVAPRO) 300 MG tablet Take 300 mg by mouth daily.     Historical Provider, MD  metoCLOPramide (REGLAN) 5 MG tablet Take 1 tablet (5 mg total) by mouth 3 (three) times daily before meals. 03/07/14   Lafayette Dragon, MD  metoprolol succinate (TOPROL-XL) 50 MG 24 hr tablet Take 50 mg by mouth daily. Take with or immediately following a meal.    Historical Provider, MD  metroNIDAZOLE (FLAGYL) 250 MG tablet Take 1 tablet (250 mg total) by mouth 3 (three) times daily. 02/22/14   Lafayette Dragon, MD   pregabalin (LYRICA) 50 MG capsule Take 50 mg by mouth 2 (two) times daily.    Historical Provider, MD  Probiotic Product (RESTORA PO) Take 1 capsule by mouth once.    Historical Provider, MD  pyridOXINE (VITAMIN B-6) 100 MG tablet Take 100 mg by mouth daily.    Historical Provider, MD  RABEprazole (ACIPHEX) 20 MG tablet Take 1 tablet (20 mg total) by mouth 2 (two) times daily. 04/22/12   Lafayette Dragon, MD  sucralfate (CARAFATE) 1 GM/10ML suspension Take 10 mLs (1 g total) by mouth 2 (two) times daily. 02/22/14   Lafayette Dragon, MD  thiamine (VITAMIN B-1) 100 MG tablet Take 100 mg by mouth daily.    Historical Provider, MD  traMADol (ULTRAM) 50 MG tablet Take 50 mg by mouth 4 (four) times daily as needed for moderate pain. Has been taking two in the morning and one at night past few days.    Historical Provider, MD  venlafaxine XR (EFFEXOR-XR) 150 MG 24 hr capsule Take 150 mg by mouth 2 (two) times daily.    Historical Provider, MD  zaleplon (SONATA) 10 MG capsule Take 10 mg by mouth at bedtime.    Historical Provider, MD   BP 173/73  Pulse 87  Temp(Src) 99.2 F (37.3 C) (Oral)  Resp 23  Ht 5\' 5"  (1.651 m)  Wt 180 lb (81.647 kg)  BMI 29.95 kg/m2  SpO2 96% Physical Exam  Nursing note and vitals reviewed. Constitutional: She is oriented to person, place, and time. She appears well-developed and well-nourished.  HENT:  Head: Atraumatic.  Cardiovascular: Normal rate and regular rhythm.   Pulmonary/Chest: Effort normal and breath sounds normal.  Generalized left sided tenderness  Abdominal: Soft. Bowel sounds are normal. There is no tenderness.  Musculoskeletal: Normal range of motion.  Neurological: She is alert and oriented to person, place, and time. Coordination normal.  Skin: Skin is warm and dry.  Psychiatric: She has a normal mood and affect.    ED Course  Procedures (including critical care time) Labs Review Labs Reviewed  CBC - Abnormal; Notable for the following:     Hemoglobin 15.2 (*)    All other components within normal limits  BASIC METABOLIC PANEL - Abnormal; Notable for the following:    Chloride 95 (*)    Glucose, Bld 382 (*)    GFR calc non Af Amer 90 (*)    Anion gap 16 (*)    All other components within normal limits  PRO B NATRIURETIC PEPTIDE  D-DIMER, QUANTITATIVE  Randolm Idol, ED    Imaging Review Dg Chest 2 View  05/25/2014   CLINICAL DATA:  63 year old female with acute left chest pain associated with shortness of breath and dizziness. Initial encounter.  EXAM: CHEST  2 VIEW  COMPARISON:  Chest radiographs 03/05/2009.  FINDINGS: Improved lung volumes. Normal cardiac size and mediastinal contours. Visualized tracheal air column is within normal limits. The lungs are clear. No pneumothorax or effusion. Stable cholecystectomy clips. No acute osseous abnormality identified.  IMPRESSION: Negative, no acute cardiopulmonary abnormality.   Electronically Signed   By: Lars Pinks M.D.   On: 05/25/2014 13:22     EKG Interpretation   Date/Time:  Wednesday May 25 2014 12:57:01 EDT Ventricular Rate:  93 PR Interval:  178 QRS Duration: 112 QT Interval:  410 QTC Calculation: 509 R Axis:   -50 Text Interpretation:  Normal sinus rhythm Left axis deviation Incomplete  right bundle branch block Anteroseptal infarct , age undetermined Abnormal  ECG Nonspecific ST and T wave abnormality Confirmed by Tameca Jerez  MD,  Ovid Curd [phone]) on 05/25/2014 4:14:15 PM      MDM   Final diagnoses:  Chest pain, unspecified chest pain type  Hyperglycemia    2:44 PM Elevated blood sugar noted. Pt didn't take her oral medications this morning 4:55 PM Pt needs to be admitted for further evaluation. Pt to be [facility]    Glendell Docker, NP 05/25/14 1656

## 2014-05-25 NOTE — ED Notes (Signed)
Per pt sts worsening chest pain over the past few days. sts left sided under breast. sts a shocking feeling. sts some SOB with exertion and nausea.

## 2014-05-25 NOTE — ED Notes (Signed)
PA at bedside.

## 2014-05-25 NOTE — ED Notes (Signed)
Chest pain since last week. Suddenly got worse last night and has been persistent.

## 2014-05-25 NOTE — Progress Notes (Signed)
Patient refused CPAP at this time.  Patient states she has not worn CPAP in several years.  Patient encouraged to call for Respiratory is she would like to use CPAP during her hospital stay.

## 2014-05-26 ENCOUNTER — Encounter (HOSPITAL_COMMUNITY)
Admission: EM | Disposition: A | Discharge status: Home / Self Care | Payer: Self-pay | Source: Home / Self Care | Attending: Emergency Medicine

## 2014-05-26 DIAGNOSIS — E785 Hyperlipidemia, unspecified: Secondary | ICD-10-CM

## 2014-05-26 DIAGNOSIS — R072 Precordial pain: Secondary | ICD-10-CM

## 2014-05-26 HISTORY — PX: LEFT HEART CATHETERIZATION WITH CORONARY ANGIOGRAM: SHX5451

## 2014-05-26 LAB — HEMOGLOBIN A1C
Hgb A1c MFr Bld: 8.5 % — ABNORMAL HIGH (ref <5.7)
Mean Plasma Glucose: 197 mg/dL — ABNORMAL HIGH (ref <117)

## 2014-05-26 LAB — TROPONIN I
Troponin I: <0.3 ng/mL (ref <0.30)
Troponin I: <0.3 ng/mL (ref <0.30)

## 2014-05-26 LAB — PROTIME-INR
INR: 1.04 (ref 0.00–1.49)
Prothrombin Time: 13.7 seconds (ref 11.6–15.2)

## 2014-05-26 LAB — GLUCOSE, CAPILLARY
Glucose-Capillary: 147 mg/dL — ABNORMAL HIGH (ref 70–99)
Glucose-Capillary: 160 mg/dL — ABNORMAL HIGH (ref 70–99)
Glucose-Capillary: 146 mg/dL — ABNORMAL HIGH (ref 70–99)

## 2014-05-26 SURGERY — LEFT HEART CATHETERIZATION WITH CORONARY ANGIOGRAM
Anesthesia: LOCAL

## 2014-05-26 MED ORDER — HEPARIN SODIUM (PORCINE) 1000 UNIT/ML IJ SOLN
INTRAMUSCULAR | Status: AC
  Filled 2014-05-26: qty 1

## 2014-05-26 MED ORDER — ACETAMINOPHEN 325 MG PO TABS: 650.0000 mg | ORAL_TABLET | ORAL | Status: DC | PRN

## 2014-05-26 MED ORDER — SODIUM CHLORIDE 0.9 % IJ SOLN: 3.0000 mL | INTRAMUSCULAR | Status: DC | PRN

## 2014-05-26 MED ORDER — MIDAZOLAM HCL 2 MG/2ML IJ SOLN
INTRAMUSCULAR | Status: AC
  Filled 2014-05-26: qty 2

## 2014-05-26 MED ORDER — VERAPAMIL HCL 2.5 MG/ML IV SOLN
INTRAVENOUS | Status: AC
  Filled 2014-05-26: qty 2

## 2014-05-26 MED ORDER — SODIUM CHLORIDE 0.9 % IV SOLN
INTRAVENOUS | Status: DC
  Administered 2014-05-26: 14:00:00 via INTRAVENOUS

## 2014-05-26 MED ORDER — ASPIRIN 81 MG PO CHEW: 81.0000 mg | CHEWABLE_TABLET | ORAL | Status: DC

## 2014-05-26 MED ORDER — LIDOCAINE HCL (PF) 1 % IJ SOLN
INTRAMUSCULAR | Status: AC
  Filled 2014-05-26: qty 30

## 2014-05-26 MED ORDER — SODIUM CHLORIDE 0.9 % IV SOLN: 250.0000 mL | INTRAVENOUS | Status: DC | PRN

## 2014-05-26 MED ORDER — NITROGLYCERIN 1 MG/10 ML FOR IR/CATH LAB
INTRA_ARTERIAL | Status: AC
  Filled 2014-05-26: qty 10

## 2014-05-26 MED ORDER — FENTANYL CITRATE 0.05 MG/ML IJ SOLN
INTRAMUSCULAR | Status: AC
  Filled 2014-05-26: qty 2

## 2014-05-26 MED ORDER — ONDANSETRON HCL 4 MG/2ML IJ SOLN: 4.0000 mg | Freq: Four times a day (QID) | INTRAMUSCULAR | Status: DC | PRN

## 2014-05-26 MED ORDER — SODIUM CHLORIDE 0.9 % IJ SOLN
3.0000 mL | Freq: Two times a day (BID) | INTRAMUSCULAR | Status: DC
  Administered 2014-05-26: 3 mL via INTRAVENOUS

## 2014-05-26 MED ORDER — HEPARIN (PORCINE) IN NACL 2-0.9 UNIT/ML-% IJ SOLN
INTRAMUSCULAR | Status: AC
  Filled 2014-05-26: qty 1000

## 2014-05-26 NOTE — Discharge Summary (Signed)
Discharge Summary  Alexis [name] JOI:[id number] DOB: [birth year]  PCP: Dwan Bolt, MD  Admit date: 05/25/2014 Discharge date: 05/26/2014  Time spent: 25 minutes  Recommendations for Outpatient Follow-up:  1. Patient will follow up with her PCP in the next one month as needed  2. At that time, discussion can be made to increase her Lantus for better control of her blood sugars  Discharge Diagnoses:  Active Hospital Problems   Diagnosis Date Noted  . Chest pain 01/29/2013  . Obesity (BMI 30-39.9) 05/25/2014  . Precordial chest pain 05/25/2014  . DM (diabetes mellitus), type 2, uncontrolled 08/24/2010  . Hyperlipidemia 08/24/2010  . Depression 08/24/2010  . Essential hypertension 08/24/2010    Resolved Hospital Problems   Diagnosis Date Noted Date Resolved  No resolved problems to display.    Discharge Condition: Improved, being discharged home  Diet recommendation: Heart healthy carb modified  Filed Weights   05/25/14 1257 05/25/14 1827  Weight: 81.647 kg (180 lb) 83.689 kg (184 lb 8 oz)    History of present illness:  Patient is a 62 year old female past medical history CAD, obstructive sleep apnea and diabetes mellitus who was having intermittent chest discomfort with some associated shortness of breath and came to the emergency room on 10/14 when her chest pressure/pain became more continuous. This was relieved with nitroglycerin. Enzymes and EKG were unrevealing initially. Patient was brought in to the hospitalist service.  Hospital Course:  Principal Problem:   Chest pain: Felt to be more musculoskeletal. Enzymes x3 negative. Patient was seen by cardiology he was noted her previous history she had an abnormal EKG with anteroseptal MI versus poor R-wave progression a negative stress test in 2014. EKG done on admission was unchanged. It was felt best to get a cardiac catheterization to get final diagnosis. Patient's creatinine was normal and she was taken for  catheterization on 10/15. Active Problems:   DM (diabetes mellitus), type 2, uncontrolled: A1c at 8.5. Patient continued on home medication. She'll follow up with her PCP and they can discuss about adjusting medication doses for better control for her.   Hyperlipidemia: Stable. Continue on statin.    Depression: Stable. Patient continued on antidepressants.    Essential hypertension: Patient will be continued on her home medications, blood pressure stable.    Obesity (BMI 30-39.9): Patient criteria with BMI greater than 30    Procedures:  Cardiac catheterization done 10/15: Noting normal LV function and normal coronary arteries  Consultations:  Cardiology  Discharge Exam: BP 124/51  Pulse 87  Temp(Src) 98.7 F (37.1 C) (Oral)  Resp 16  Ht 5\' 5"  (1.651 m)  Wt 83.689 kg (184 lb 8 oz)  BMI 30.70 kg/m2  SpO2 95%  General: Alert and oriented x3, no acute distress Cardiovascular: Regular rate and rhythm, S1-S2 Respiratory: Clear to auscultation bilaterally  Discharge Instructions You were cared for by a hospitalist during your hospital stay. If you have any questions about your discharge medications or the care you received while you were in the hospital after you are discharged, you can call the unit and asked to speak with the hospitalist on call if the hospitalist that took care of you is not available. Once you are discharged, your primary care physician will handle any further medical issues. Please note that NO REFILLS for any discharge medications will be authorized once you are discharged, as it is imperative that you return to your primary care physician (or establish a relationship with a primary care physician if you  do not have one) for your aftercare needs so that they can reassess your need for medications and monitor your lab values.  Discharge Instructions   Diet - low sodium heart healthy    Complete by:  As directed      Increase activity slowly    Complete by:   As directed             Medication List         acetaminophen 500 MG tablet  Commonly known as:  TYLENOL  Take 500 mg by mouth every 6 (six) hours as needed for headache.     ALPRAZolam 0.5 MG tablet  Commonly known as:  XANAX  Take 0.5 mg by mouth at bedtime.     atorvastatin 20 MG tablet  Commonly known as:  LIPITOR  Take 20 mg by mouth daily.     buPROPion 300 MG 24 hr tablet  Commonly known as:  WELLBUTRIN XL  Take 300 mg by mouth daily.     estradiol 0.075 MG/24HR  Commonly known as:  VIVELLE-DOT  Place 0.5 patches onto the skin 2 (two) times a week. Wednesdays and saturdays     fish oil-omega-3 fatty acids 1000 MG capsule  Take 1 g by mouth at bedtime.     insulin glargine 100 UNIT/ML injection  Commonly known as:  LANTUS  Inject 20 Units into the skin every morning.     INVOKANA 300 MG Tabs  Generic drug:  Canagliflozin  Take 300 mg by mouth daily.     irbesartan 300 MG tablet  Commonly known as:  AVAPRO  Take 300 mg by mouth daily.     loratadine 10 MG tablet  Commonly known as:  CLARITIN  Take 10 mg by mouth daily.     metoCLOPramide 5 MG tablet  Commonly known as:  REGLAN  Take 1 tablet (5 mg total) by mouth 3 (three) times daily before meals.     metoprolol succinate 50 MG 24 hr tablet  Commonly known as:  TOPROL-XL  Take 50 mg by mouth daily. Take with or immediately following a meal.     pregabalin 50 MG capsule  Commonly known as:  LYRICA  Take 50 mg by mouth 2 (two) times daily.     pyridOXINE 100 MG tablet  Commonly known as:  VITAMIN B-6  Take 100 mg by mouth at bedtime.     RABEprazole 20 MG tablet  Commonly known as:  ACIPHEX  Take 1 tablet (20 mg total) by mouth 2 (two) times daily.     RESTORA PO  Take 1 capsule by mouth daily.     TANZEUM 30 MG Pen  Generic drug:  Albiglutide  Inject 30 mg into the skin every 7 (seven) days. Takes on sundays     thiamine 100 MG tablet  Commonly known as:  VITAMIN B-1  Take 100 mg by  mouth at bedtime.     traMADol 50 MG tablet  Commonly known as:  ULTRAM  Take 100 mg by mouth 2 (two) times daily. Has been taking two in the morning and one at night past few days.     venlafaxine XR 150 MG 24 hr capsule  Commonly known as:  EFFEXOR-XR  Take 150 mg by mouth 2 (two) times daily.     Vitamin D3 1000 UNITS Caps  Take 1 capsule by mouth at bedtime.     zaleplon 5 MG capsule  Commonly known as:  SONATA  Take  5 mg by mouth at bedtime.       Allergies  Allergen Reactions  . Aspirin     Reaction: Vasculitis per MD  . Codeine     REACTION: "makes her itch"  . Naproxen     Reaction: Makes stomach upset/irritated.  . Sulfonamide Derivatives Rash    REACTION: itching      The results of significant diagnostics from this hospitalization (including imaging, microbiology, ancillary and laboratory) are listed below for reference.    Significant Diagnostic Studies: Dg Chest 2 View  05/25/2014   CLINICAL DATA:  63 year old female with acute left chest pain associated with shortness of breath and dizziness. Initial encounter.  EXAM: CHEST  2 VIEW  COMPARISON:  Chest radiographs 03/05/2009.  FINDINGS: Improved lung volumes. Normal cardiac size and mediastinal contours. Visualized tracheal air column is within normal limits. The lungs are clear. No pneumothorax or effusion. Stable cholecystectomy clips. No acute osseous abnormality identified.  IMPRESSION: Negative, no acute cardiopulmonary abnormality.   Electronically Signed   By: Lars Pinks M.D.   On: 05/25/2014 13:22    Microbiology: No results found for this or any previous visit (from the past 240 hour(s)).   Labs: Basic Metabolic Panel:  Recent Labs Lab 05/25/14 1300 05/25/14 2130  NA 137  --   K 4.1  --   CL 95*  --   CO2 26  --   GLUCOSE 382*  --   BUN 14  --   CREATININE 0.73 0.69  CALCIUM 9.4  --    Liver Function Tests: No results found for this basename: AST, ALT, ALKPHOS, BILITOT, PROT, ALBUMIN,   in the last 168 hours No results found for this basename: LIPASE, AMYLASE,  in the last 168 hours No results found for this basename: AMMONIA,  in the last 168 hours CBC:  Recent Labs Lab 05/25/14 1300 05/25/14 2130  WBC 8.0 6.3  HGB 15.2* 13.4  HCT 45.0 39.2  MCV 89.3 86.2  PLT 239 215   Cardiac Enzymes:  Recent Labs Lab 05/25/14 2130 05/26/14 0315 05/26/14 0758  TROPONINI <0.30 <0.30 <0.30   BNP: BNP (last 3 results)  Recent Labs  05/25/14 1300  PROBNP 67.3   CBG:  Recent Labs Lab 05/25/14 1819 05/25/14 2044 05/26/14 0736 05/26/14 1122 05/26/14 1425  GLUCAP 186* 140* 147* 160* 146*       Signed:  Linday Rhodes K  [facility] 05/26/2014, 4:47 PM

## 2014-05-26 NOTE — ED Provider Notes (Signed)
Medical screening examination/treatment/procedure(s) were performed by non-physician practitioner and as supervising physician I was immediately available for consultation/collaboration.   EKG Interpretation   Date/Time:  Wednesday May 25 2014 12:57:01 EDT Ventricular Rate:  93 PR Interval:  178 QRS Duration: 112 QT Interval:  410 QTC Calculation: 509 R Axis:   -50 Text Interpretation:  Normal sinus rhythm Left axis deviation Incomplete  right bundle branch block Anteroseptal infarct , age undetermined Abnormal  ECG Nonspecific ST and T wave abnormality Confirmed by Alvino Chapel  MD,  NATHAN [phone]) on 05/25/2014 4:14:15 PM        Merryl Hacker, MD 05/26/14 1736

## 2014-05-26 NOTE — Progress Notes (Signed)
UR completed 

## 2014-05-26 NOTE — Plan of Care (Signed)
Problem: Phase I Progression Outcomes Goal: Aspirin unless contraindicated Outcome: Completed/Met Date Met:  05/26/14 Allergy to aspirin     

## 2014-05-26 NOTE — Interval H&P Note (Signed)
Cath Lab Visit (complete for each Cath Lab visit)  Clinical Evaluation Leading to the Procedure:   ACS: No.  Non-ACS:    Anginal Classification: CCS III  Anti-ischemic medical therapy: Minimal Therapy (1 class of medications)  Non-Invasive Test Results: No non-invasive testing performed  Prior CABG: No previous CABG      History and Physical Interval Note:  05/26/2014 1:15 PM  Alexis [name]  has presented today for surgery, with the diagnosis of cp  The various methods of treatment have been discussed with the patient and family. After consideration of risks, benefits and other options for treatment, the patient has consented to  Procedure(s): LEFT HEART CATHETERIZATION WITH CORONARY ANGIOGRAM (N/A) as a surgical intervention .  The patient's history has been reviewed, patient examined, no change in status, stable for surgery.  I have reviewed the patient's chart and labs.  Questions were answered to the patient's satisfaction.     Nayda Riesen A

## 2014-05-26 NOTE — Consult Note (Addendum)
CARDIOLOGY CONSULT NOTE   Patient ID: Alexis [name] MRN: [id number], DOB/AGE: 62-May-1952   Admit date: 05/25/2014 Date of Consult: 05/26/2014  Primary Physician: Dwan Bolt, MD Primary Cardiologist: Dr Lyman Bishop  Reason for consult:  Chest pain  Problem List  Past Medical History  Diagnosis Date  . Hx of gastritis   . GERD (gastroesophageal reflux disease)   . Irritable bowel syndrome (IBS)   . Depression   . Hypertension   . Hyperlipidemia   . Fatty liver   . Anxiety   . Hyperplastic colon polyp   . Diverticulitis   . Pancreatic cyst     peripancreatic cystic lesion  . Renal cyst   . Sprain of neck 03/18/2013  . Obesity   . Uterine cancer dx'd 2000    surg only  . Heart attack 2014    "mild"  . Vasculitis     "irritates my legs"  . Diabetes mellitus type II   . Sleep apnea     "wore mask; took it off in my sleep; quit wearing it" (05/25/2014)  . H/O hiatal hernia   . Gastroparesis     "recently dx'd" (05/25/2014)  . VZCHYIFO(277.4)     "monthly" (05/25/2014)  . Migraine without aura, without mention of intractable migraine without mention of status migrainosus     "related to allergies; have them in the spring and fall" (05/25/2014)  . Arthritis     "knees" (05/25/2014)  . Bursitis of knee     "both"  . Fibromyalgia   . Chronic back pain     Past Surgical History  Procedure Laterality Date  . Sinus surgery with instatrak  2000  . Appendectomy  ~ 1967  . Laparoscopic cholecystectomy  1990  . Breast cyst excision Right 1990  . Vaginal hysterectomy  2000  . Tubal ligation  ~ 1982     Allergies  Allergies  Allergen Reactions  . Aspirin     Reaction: Vasculitis per MD  . Codeine     REACTION: "makes her itch"  . Naproxen     Reaction: Makes stomach upset/irritated.  . Sulfonamide Derivatives Rash    REACTION: itching    HPI   63 year old female with PMH of obesity, obstructive sleep apnea and diabetes mellitus complicated  by diabetic neuropathy, HTN, hyperlipidemia, ? Lower extremity vasculitis who presented with left sided chest pain, "fluttering" associated with dizziness and diaphoresis.  This started about a week ago and was progressively worsening. The patient also noticed that she fells overall more tired, walking 1 flight of stairs makes her very SOB, tired and dizzy. Her symptoms have been relieved by sl NTG in the ER and haven't recurred.  She has been exposed to second hand smoking for many years. Her father had MI in her early 66'.   She was previously evaluated by Dr Debara Pickett for an abnormal ECG showing anteroseptal infarct. She underwent a Lexiscan stress test in April 2014 that showed normal LVEF and no ischemia, there was no evidence for prior infarct. Her pain was considered atypical and her ECG was described as poor R wave progression. She states its different right now.   Inpatient Medications  . ALPRAZolam  0.5 mg Oral QHS  . atorvastatin  20 mg Oral Daily  . buPROPion  300 mg Oral Daily  . enoxaparin (LOVENOX) injection  40 mg Subcutaneous Q24H  . Influenza vac split quadrivalent PF  0.5 mL Intramuscular Tomorrow-1000  . insulin aspart  0-20  Units Subcutaneous TID WC  . insulin aspart  0-5 Units Subcutaneous QHS  . insulin glargine  20 Units Subcutaneous Q breakfast  . irbesartan  300 mg Oral Daily  . pregabalin  50 mg Oral BID  . venlafaxine XR  150 mg Oral BID   Family History  Family History  Problem Relation Age of Onset  . Breast cancer Sister   . Colon cancer Maternal Uncle   . Ovarian cancer Maternal Aunt   . Stomach cancer Maternal Aunt   . Colon polyps Father   . Heart disease Father   . Diabetes Maternal Aunt   . Heart disease Maternal Uncle   . Heart disease      Grandparents  . Irritable bowel syndrome Daughter     Social History History   Social History  . Marital Status: Married    Spouse Name: N/A    Number of Children: 2  . Years of Education: N/A    Occupational History  . Retired   .     Social History Main Topics  . Smoking status: Never Smoker   . Smokeless tobacco: Never Used  . Alcohol Use: No  . Drug Use: No  . Sexual Activity: No   Other Topics Concern  . Not on file   Social History Narrative   Daily Caffeine.    Review of Systems  General:  No chills, fever, night sweats or weight changes.  Cardiovascular:  No chest pain, dyspnea on exertion, edema, orthopnea, palpitations, paroxysmal nocturnal dyspnea. Dermatological: No rash, lesions/masses Respiratory: No cough, dyspnea Urologic: No hematuria, dysuria Abdominal:   No nausea, vomiting, diarrhea, bright red blood per rectum, melena, or hematemesis Neurologic:  No visual changes, wkns, changes in mental status. All other systems reviewed and are otherwise negative except as noted above.  Physical Exam  Blood pressure 134/54, pulse 72, temperature 98.7 F (37.1 C), temperature source Oral, resp. rate 18, height 5\' 5"  (1.651 m), weight 184 lb 8 oz (83.689 kg), SpO2 96.00%.  General: Pleasant, NAD Psych: Normal affect. Neuro: Alert and oriented X 3. Moves all extremities spontaneously. HEENT: Normal  Neck: Supple without bruits or JVD. Lungs:  Resp regular and unlabored, CTA. Heart: RRR no s3, s4, or murmurs. Abdomen: Soft, non-tender, non-distended, BS + x 4.  Extremities: No clubbing, cyanosis or edema. DP/PT/Radials 2+ and equal bilaterally.  Labs  Recent Labs  05/25/14 2130 05/26/14 0315 05/26/14 0758  TROPONINI <0.30 <0.30 <0.30   Lab Results  Component Value Date   WBC 6.3 05/25/2014   HGB 13.4 05/25/2014   HCT 39.2 05/25/2014   MCV 86.2 05/25/2014   PLT 215 05/25/2014    Recent Labs Lab 05/25/14 1300 05/25/14 2130  NA 137  --   K 4.1  --   CL 95*  --   CO2 26  --   BUN 14  --   CREATININE 0.73 0.69  CALCIUM 9.4  --   GLUCOSE 382*  --    No results found for this basename: CHOL, HDL, LDLCALC, TRIG   Lab Results  Component  Value Date   DDIMER <0.27 05/25/2014   Radiology/Studies  Dg Chest 2 View  05/25/2014   CLINICAL DATA:  63 year old female with acute left chest pain associated with shortness of breath and dizziness. Initial encounter.  EXAM: CHEST  2 VIEW  COMPARISON:  Chest radiographs 03/05/2009.  FINDINGS: Improved lung volumes. Normal cardiac size and mediastinal contours. Visualized tracheal air column is within normal limits. The  lungs are clear. No pneumothorax or effusion. Stable cholecystectomy clips. No acute osseous abnormality identified.  IMPRESSION: Negative, no acute cardiopulmonary abnormality.   Electronically Signed   By: Lars Pinks M.D.   On: 05/25/2014 13:22   Echocardiogram - 2008  ECG: SR, LAD, anteroseptal infarct - vs poor R wave progression, unchanged from 2014     ASSESSMENT AND PLAN  63 year old   1. Chest pain with some typical and atypical features, significant associated symptoms and a relief with NTG. ACS ruled out. ECG unchanged from 2014.  She has an abnormal ECG with anteroseptal MI vs poor R wave progression and negative stress test in 2014. We will schedule a cath to get a final diagnosis considering repeat evaluation and significant risk factors. Crea is normal.   2. Palpitations - no arrhythmias recorded on telemetry, if normal cath I would recommend to arrange for an outpatient e-cardio monitoring  3. Hypertension - controlled on current regimen  4. Hyperlipidemia - on atorvastatin 20 mg po daily  5. DM2 - insulin dependant , HbA1c 8.5%   Signed, Dorothy Spark, MD, [facility] 05/26/2014, 9:24 AM

## 2014-05-26 NOTE — CV Procedure (Signed)
AZUCENA DART is a 63 y.o. female   [id number]  [id number] LOCATION:  FACILITY: [place]  PHYSICIAN: Troy Sine, MD, [facility] [place] Apr 05, 1951   DATE OF PROCEDURE:  05/26/2014     CARDIAC CATHETERIZATION    HISTORY:   Ms. Danielski is a 63 year old, Caucasian female who has a history of obesity, obstructive sleep apnea, type 2 diabetes mellitus complicated by neuropathy, hypertension, and hyperlipidemia.  She experienced episodes of left-sided chest pain, and chest fluttering associated with dizziness and diaphoresis.  She also has noticed exertional shortness of breath.  She was seen in cardiology consultation by Dr. Meda Coffee and in light of her cardiac risk factors definitive cardiac catheterization was recommended.   PROCEDURE:  Left heart catheterization via the right radial artery: Coronary angiography, left ventriculography.  The patient was brought to the second floor [facility] cath lab in the postabsorptive state. The patient was premedicated with Versed 2 mg and fentanyl 50 mcg. A right radial approach was utilized after an Allen's test verified adequate circulation. The right radial artery was punctured via the Seldinger technique, and a 6 [place] Glidesheath Slender was inserted without difficulty.  A radial cocktail consisting of Verapamil, IV nitroglycerin, and lidocaine was administered. Weight adjusted heparin was administered. A safety J wire was advanced into the ascending aorta. Diagnostic catheterization was done with 5 [place] JR 4 and TIG 4.0 catheters.  A 5 French pigtail catheter was used for left ventriculography. A TR radial band was applied for hemostasis. The patient left the catheterization laboratory in stable condition.   HEMODYNAMICS:   Central Aorta: 145/72  Left Ventricle: 145/10  ANGIOGRAPHY:   The left main coronary artery was angiographically normal and bifurcated into the LAD and left circumflex coronary artery.   The LAD was angiographically  normal and gave rise to 2 major diagonal vessels and several septal perforating arteries. The vessel extended to the LV apex.   The left circumflex coronary artery was angiographically normal and gave rise to one major bifurcating obtuse marginal branch.   The RCA was angiographically normal it gave rise to a large PDA and PLA vessel.   Left ventriculography revealed normal global LV contractility without focal segmental wall motion abnormalities. There was no evidence for mitral regurgitation.  Ejection fraction estimated at 65%.   Total contrast used: 80 cc Omnipaque   IMPRESSION:  Normal LV function.  Normal coronary arteries.    Troy Sine, MD, [facility] 05/26/2014 2:06 PM

## 2014-05-26 NOTE — H&P (View-Only) (Signed)
CARDIOLOGY CONSULT NOTE   Patient ID: Alexis [name] MRN: [id number], DOB/AGE: [birth year]   Admit date: 05/25/2014 Date of Consult: 05/26/2014  Primary Physician: Dwan Bolt, MD Primary Cardiologist: Dr Lyman Bishop  Reason for consult:  Chest pain  Problem List  Past Medical History  Diagnosis Date  . Hx of gastritis   . GERD (gastroesophageal reflux disease)   . Irritable bowel syndrome (IBS)   . Depression   . Hypertension   . Hyperlipidemia   . Fatty liver   . Anxiety   . Hyperplastic colon polyp   . Diverticulitis   . Pancreatic cyst     peripancreatic cystic lesion  . Renal cyst   . Sprain of neck 03/18/2013  . Obesity   . Uterine cancer dx'd 2000    surg only  . Heart attack 2014    "mild"  . Vasculitis     "irritates my legs"  . Diabetes mellitus type II   . Sleep apnea     "wore mask; took it off in my sleep; quit wearing it" (05/25/2014)  . H/O hiatal hernia   . Gastroparesis     "recently dx'd" (05/25/2014)  . UVOZDGUY(403.4)     "monthly" (05/25/2014)  . Migraine without aura, without mention of intractable migraine without mention of status migrainosus     "related to allergies; have them in the spring and fall" (05/25/2014)  . Arthritis     "knees" (05/25/2014)  . Bursitis of knee     "both"  . Fibromyalgia   . Chronic back pain     Past Surgical History  Procedure Laterality Date  . Sinus surgery with instatrak  2000  . Appendectomy  ~ 1967  . Laparoscopic cholecystectomy  1990  . Breast cyst excision Right 1990  . Vaginal hysterectomy  2000  . Tubal ligation  ~ 1982     Allergies  Allergies  Allergen Reactions  . Aspirin     Reaction: Vasculitis per MD  . Codeine     REACTION: "makes her itch"  . Naproxen     Reaction: Makes stomach upset/irritated.  . Sulfonamide Derivatives Rash    REACTION: itching    HPI   63 year old female with PMH of obesity, obstructive sleep apnea and diabetes mellitus complicated  by diabetic neuropathy, HTN, hyperlipidemia, ? Lower extremity vasculitis who presented with left sided chest pain, "fluttering" associated with dizziness and diaphoresis.  This started about a week ago and was progressively worsening. The patient also noticed that she fells overall more tired, walking 1 flight of stairs makes her very SOB, tired and dizzy. Her symptoms have been relieved by sl NTG in the ER and haven't recurred.  She has been exposed to second hand smoking for many years. Her father had MI in her early 48'.   She was previously evaluated by Dr Debara Pickett for an abnormal ECG showing anteroseptal infarct. She underwent a Lexiscan stress test in April 2014 that showed normal LVEF and no ischemia, there was no evidence for prior infarct. Her pain was considered atypical and her ECG was described as poor R wave progression. She states its different right now.   Inpatient Medications  . ALPRAZolam  0.5 mg Oral QHS  . atorvastatin  20 mg Oral Daily  . buPROPion  300 mg Oral Daily  . enoxaparin (LOVENOX) injection  40 mg Subcutaneous Q24H  . Influenza vac split quadrivalent PF  0.5 mL Intramuscular Tomorrow-1000  . insulin aspart  0-20  Units Subcutaneous TID WC  . insulin aspart  0-5 Units Subcutaneous QHS  . insulin glargine  20 Units Subcutaneous Q breakfast  . irbesartan  300 mg Oral Daily  . pregabalin  50 mg Oral BID  . venlafaxine XR  150 mg Oral BID   Family History  Family History  Problem Relation Age of Onset  . Breast cancer Sister   . Colon cancer Maternal Uncle   . Ovarian cancer Maternal Aunt   . Stomach cancer Maternal Aunt   . Colon polyps Father   . Heart disease Father   . Diabetes Maternal Aunt   . Heart disease Maternal Uncle   . Heart disease      Grandparents  . Irritable bowel syndrome Daughter     Social History History   Social History  . Marital Status: Married    Spouse Name: N/A    Number of Children: 2  . Years of Education: N/A    Occupational History  . Retired   .     Social History Main Topics  . Smoking status: Never Smoker   . Smokeless tobacco: Never Used  . Alcohol Use: No  . Drug Use: No  . Sexual Activity: No   Other Topics Concern  . Not on file   Social History Narrative   Daily Caffeine.    Review of Systems  General:  No chills, fever, night sweats or weight changes.  Cardiovascular:  No chest pain, dyspnea on exertion, edema, orthopnea, palpitations, paroxysmal nocturnal dyspnea. Dermatological: No rash, lesions/masses Respiratory: No cough, dyspnea Urologic: No hematuria, dysuria Abdominal:   No nausea, vomiting, diarrhea, bright red blood per rectum, melena, or hematemesis Neurologic:  No visual changes, wkns, changes in mental status. All other systems reviewed and are otherwise negative except as noted above.  Physical Exam  Blood pressure 134/54, pulse 72, temperature 98.7 F (37.1 C), temperature source Oral, resp. rate 18, height 5\' 5"  (1.651 m), weight 184 lb 8 oz (83.689 kg), SpO2 96.00%.  General: Pleasant, NAD Psych: Normal affect. Neuro: Alert and oriented X 3. Moves all extremities spontaneously. HEENT: Normal  Neck: Supple without bruits or JVD. Lungs:  Resp regular and unlabored, CTA. Heart: RRR no s3, s4, or murmurs. Abdomen: Soft, non-tender, non-distended, BS + x 4.  Extremities: No clubbing, cyanosis or edema. DP/PT/Radials 2+ and equal bilaterally.  Labs  Recent Labs  05/25/14 2130 05/26/14 0315 05/26/14 0758  TROPONINI <0.30 <0.30 <0.30   Lab Results  Component Value Date   WBC 6.3 05/25/2014   HGB 13.4 05/25/2014   HCT 39.2 05/25/2014   MCV 86.2 05/25/2014   PLT 215 05/25/2014    Recent Labs Lab 05/25/14 1300 05/25/14 2130  NA 137  --   K 4.1  --   CL 95*  --   CO2 26  --   BUN 14  --   CREATININE 0.73 0.69  CALCIUM 9.4  --   GLUCOSE 382*  --    No results found for this basename: CHOL, HDL, LDLCALC, TRIG   Lab Results  Component  Value Date   DDIMER <0.27 05/25/2014   Radiology/Studies  Dg Chest 2 View  05/25/2014   CLINICAL DATA:  63 year old female with acute left chest pain associated with shortness of breath and dizziness. Initial encounter.  EXAM: CHEST  2 VIEW  COMPARISON:  Chest radiographs 03/05/2009.  FINDINGS: Improved lung volumes. Normal cardiac size and mediastinal contours. Visualized tracheal air column is within normal limits. The  lungs are clear. No pneumothorax or effusion. Stable cholecystectomy clips. No acute osseous abnormality identified.  IMPRESSION: Negative, no acute cardiopulmonary abnormality.   Electronically Signed   By: Lars Pinks M.D.   On: 05/25/2014 13:22   Echocardiogram - 2008  ECG: SR, LAD, anteroseptal infarct - vs poor R wave progression, unchanged from 2014     ASSESSMENT AND PLAN  63 year old   1. Chest pain with some typical and atypical features, significant associated symptoms and a relief with NTG. ACS ruled out. ECG unchanged from 2014.  She has an abnormal ECG with anteroseptal MI vs poor R wave progression and negative stress test in 2014. We will schedule a cath to get a final diagnosis considering repeat evaluation and significant risk factors. Crea is normal.   2. Palpitations - no arrhythmias recorded on telemetry, if normal cath I would recommend to arrange for an outpatient e-cardio monitoring  3. Hypertension - controlled on current regimen  4. Hyperlipidemia - on atorvastatin 20 mg po daily  5. DM2 - insulin dependant , HbA1c 8.5%   Signed, Dorothy Spark, MD, [facility] 05/26/2014, 9:24 AM

## 2014-05-26 NOTE — Progress Notes (Signed)
TR Band removed. Right Radial, level 0. Pressure dressing applied. No immediate complications noted. Advised patient to leave dressing in place for 24 hours. Post cath radial site care reviewed with patient.   Burchett, Wilma Flavin, RN

## 2014-05-27 ENCOUNTER — Other Ambulatory Visit: Payer: Self-pay

## 2014-06-02 ENCOUNTER — Encounter: Payer: Self-pay | Admitting: Cardiovascular Disease

## 2014-06-28 ENCOUNTER — Other Ambulatory Visit: Payer: Self-pay | Admitting: Internal Medicine

## 2014-07-21 ENCOUNTER — Encounter (HOSPITAL_COMMUNITY): Payer: Self-pay | Admitting: Cardiovascular Disease

## 2014-09-26 ENCOUNTER — Other Ambulatory Visit: Payer: Self-pay | Admitting: Internal Medicine

## 2014-12-13 ENCOUNTER — Other Ambulatory Visit: Payer: Self-pay | Admitting: Internal Medicine

## 2015-03-26 ENCOUNTER — Other Ambulatory Visit: Payer: Self-pay | Admitting: Internal Medicine

## 2015-06-29 ENCOUNTER — Telehealth: Payer: Self-pay

## 2015-06-29 NOTE — Telephone Encounter (Signed)
Called pt so she could choose a new physician in order to ger refills of reglan. Left vm for pt to call back.

## 2016-03-22 ENCOUNTER — Encounter: Payer: Self-pay | Admitting: Gastroenterology

## 2016-04-01 ENCOUNTER — Encounter: Payer: Self-pay | Admitting: Gastroenterology

## 2016-08-06 ENCOUNTER — Telehealth: Payer: Self-pay | Admitting: Internal Medicine

## 2016-08-06 NOTE — Telephone Encounter (Signed)
Received records from [facility] for appointment on 08/22/16 with Dr Debara Pickett.  Records given to [employer] (medical records) for Dr [facility] schedule on 08/22/16. lp

## 2016-08-22 ENCOUNTER — Ambulatory Visit (INDEPENDENT_AMBULATORY_CARE_PROVIDER_SITE_OTHER): Payer: Medicare Other | Admitting: Internal Medicine

## 2016-08-22 ENCOUNTER — Encounter: Payer: Self-pay | Admitting: Internal Medicine

## 2016-08-22 VITALS — BP 144/84 | HR 71 | Ht 65.0 in | Wt 186.0 lb

## 2016-08-22 DIAGNOSIS — I10 Essential (primary) hypertension: Secondary | ICD-10-CM | POA: Diagnosis not present

## 2016-08-22 DIAGNOSIS — R079 Chest pain, unspecified: Secondary | ICD-10-CM | POA: Diagnosis not present

## 2016-08-22 DIAGNOSIS — G4730 Sleep apnea, unspecified: Secondary | ICD-10-CM

## 2016-08-22 DIAGNOSIS — R002 Palpitations: Secondary | ICD-10-CM

## 2016-08-22 DIAGNOSIS — R600 Localized edema: Secondary | ICD-10-CM

## 2016-08-22 NOTE — Patient Instructions (Addendum)
Medication Instructions:  Continue current medication  Labwork: None Ordered  Testing/Procedures: Non-Cardiac CT Angiography (CTA), is a special type of CT scan that uses a computer to produce multi-dimensional views of major blood vessels throughout the body. In CT angiography, a contrast material is injected through an IV to help visualize the blood vessels - done at [address] has recommended that you wear a 48 hour holter monitor - placed at [address] - [address]. Holter monitors are medical devices that record the heart's electrical activity. Doctors most often use these monitors to diagnose arrhythmias. Arrhythmias are problems with the speed or rhythm of the heartbeat. The monitor is a small, portable device. You can wear one while you do your normal daily activities. This is usually used to diagnose what is causing palpitations/syncope (passing out).  Your physician has recommended that you have a sleep study  @ [facility]. This test records several body functions during sleep, including: brain activity, eye movement, oxygen and carbon dioxide blood levels, heart rate and rhythm, breathing rate and rhythm, the flow of air through your mouth and nose, snoring, body muscle movements, and chest and belly movement.  Follow-Up: Your physician recommends that you schedule a follow-up appointment in: After Test   Any Other Special Instructions Will Be Listed Below (If Applicable).   If you need a refill on your cardiac medications before your next appointment, please call your pharmacy.
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
## 2016-08-27 ENCOUNTER — Ambulatory Visit (INDEPENDENT_AMBULATORY_CARE_PROVIDER_SITE_OTHER): Payer: Medicare Other

## 2016-08-27 ENCOUNTER — Other Ambulatory Visit: Payer: Self-pay | Admitting: Internal Medicine

## 2016-08-27 DIAGNOSIS — R002 Palpitations: Secondary | ICD-10-CM

## 2016-08-27 DIAGNOSIS — I10 Essential (primary) hypertension: Secondary | ICD-10-CM

## 2016-08-27 DIAGNOSIS — R079 Chest pain, unspecified: Secondary | ICD-10-CM

## 2016-08-27 DIAGNOSIS — N3946 Mixed incontinence: Secondary | ICD-10-CM | POA: Diagnosis not present

## 2016-08-27 DIAGNOSIS — N8184 Pelvic muscle wasting: Secondary | ICD-10-CM | POA: Diagnosis not present

## 2016-08-27 DIAGNOSIS — M6281 Muscle weakness (generalized): Secondary | ICD-10-CM | POA: Diagnosis not present

## 2016-08-27 DIAGNOSIS — R3912 Poor urinary stream: Secondary | ICD-10-CM | POA: Diagnosis not present

## 2016-08-30 ENCOUNTER — Encounter: Payer: Self-pay | Admitting: Internal Medicine

## 2016-09-02 ENCOUNTER — Ambulatory Visit (INDEPENDENT_AMBULATORY_CARE_PROVIDER_SITE_OTHER): Payer: Medicare Other | Admitting: Gastroenterology

## 2016-09-02 ENCOUNTER — Other Ambulatory Visit (INDEPENDENT_AMBULATORY_CARE_PROVIDER_SITE_OTHER): Payer: Medicare Other

## 2016-09-02 ENCOUNTER — Encounter: Payer: Self-pay | Admitting: Gastroenterology

## 2016-09-02 VITALS — BP 130/72 | HR 82 | Ht 65.0 in | Wt 187.0 lb

## 2016-09-02 DIAGNOSIS — R1011 Right upper quadrant pain: Secondary | ICD-10-CM | POA: Diagnosis not present

## 2016-09-02 DIAGNOSIS — K5904 Chronic idiopathic constipation: Secondary | ICD-10-CM

## 2016-09-02 DIAGNOSIS — M797 Fibromyalgia: Secondary | ICD-10-CM

## 2016-09-02 DIAGNOSIS — K3184 Gastroparesis: Secondary | ICD-10-CM

## 2016-09-02 DIAGNOSIS — K219 Gastro-esophageal reflux disease without esophagitis: Secondary | ICD-10-CM

## 2016-09-02 LAB — HEPATIC FUNCTION PANEL
ALT: 16 U/L (ref 0–35)
AST: 13 U/L (ref 0–37)
Albumin: 4 g/dL (ref 3.5–5.2)
Alkaline Phosphatase: 126 U/L — ABNORMAL HIGH (ref 39–117)
Bilirubin, Direct: 0.1 mg/dL (ref 0.0–0.3)
Total Bilirubin: 0.6 mg/dL (ref 0.2–1.2)
Total Protein: 7.3 g/dL (ref 6.0–8.3)

## 2016-09-02 NOTE — Progress Notes (Signed)
[facility] Progress Note  Chief Complaint: Abdominal pain  Subjective  History:  This is a 66 year old woman last seen by Dr. Olevia Perches in June 2015. At that time she was diagnosed with diabetic related gastroparesis and given a trial of metoclopramide. The patient cannot recall whether or not it helped her when she stopped it. She was also treated empirically for bacterial overgrowth with Flagyl because she was describing diarrhea. Upper endoscopy in 2013 showed nonspecific gastritis and biopsies negative for H. pylori. She did not have esophagitis or Barrett's esophagus. She has remained on chronic once daily AcipHex to control her heartburn. Over the last several months she has had small pellet-like stools with no rectal bleeding. Asked colonoscopy in 2012 was unremarkable. Also, for the last few years she has had intermittent episodes of right lower anterior chest/RUQ pain that might radiate around to the back. Has no clear relation to meals, time of day, position or bowel movement.   ROS: Cardiovascular:  no chest pain Respiratory: no dyspnea  The patient's Past Medical, Family and Social History were reviewed and are on file in the EMR.  Objective:  Med list reviewed  Vital signs in last 24 hrs: Vitals:   09/02/16 0839  BP: 130/72  Pulse: 82    Current Outpatient Prescriptions:  .  acetaminophen (TYLENOL) 500 MG tablet, Take 500 mg by mouth every 6 (six) hours as needed for headache., Disp: , Rfl:  .  ALPRAZolam (XANAX) 0.5 MG tablet, Take 0.5 mg by mouth at bedtime., Disp: , Rfl:  .  atorvastatin (LIPITOR) 20 MG tablet, Take 20 mg by mouth daily., Disp: , Rfl:  .  buPROPion (WELLBUTRIN XL) 300 MG 24 hr tablet, Take 300 mg by mouth daily., Disp: , Rfl:  .  Canagliflozin (INVOKANA) 300 MG TABS, Take 300 mg by mouth daily., Disp: , Rfl:  .  Cholecalciferol (VITAMIN D3) 1000 UNITS CAPS, Take 1 capsule by mouth at bedtime. , Disp: , Rfl:  .  Cyanocobalamin (B-12 PO), Take 1 tablet  by mouth at bedtime., Disp: , Rfl:  .  fish oil-omega-3 fatty acids 1000 MG capsule, Take 1 g by mouth at bedtime. , Disp: , Rfl:  .  insulin glargine (LANTUS) 100 UNIT/ML injection, Inject 20 Units into the skin every morning. , Disp: , Rfl:  .  irbesartan (AVAPRO) 300 MG tablet, Take 300 mg by mouth daily. , Disp: , Rfl:  .  loratadine (CLARITIN) 10 MG tablet, Take 10 mg by mouth daily as needed. , Disp: , Rfl:  .  metoprolol succinate (TOPROL-XL) 50 MG 24 hr tablet, Take 50 mg by mouth daily. Take with or immediately following a meal., Disp: , Rfl:  .  MINIVELLE 0.025 MG/24HR, Apply 1 patch topically 2 (two) times a week., Disp: , Rfl:  .  pregabalin (LYRICA) 50 MG capsule, Take 50 mg by mouth 2 (two) times daily., Disp: , Rfl:  .  Probiotic Product (RESTORA PO), Take 1 capsule by mouth daily. , Disp: , Rfl:  .  RABEprazole (ACIPHEX) 20 MG tablet, Take 1 tablet (20 mg total) by mouth 2 (two) times daily., Disp: 60 tablet, Rfl: 3 .  tamsulosin (FLOMAX) 0.4 MG CAPS capsule, Take 0.4 mg by mouth daily., Disp: , Rfl: 3 .  traMADol (ULTRAM) 50 MG tablet, Take 100 mg by mouth 2 (two) times daily. Has been taking two in the morning and one at night past few days., Disp: , Rfl:  .  venlafaxine XR (EFFEXOR-XR) 150  MG 24 hr capsule, Take 150 mg by mouth 2 (two) times daily., Disp: , Rfl:  .  zaleplon (SONATA) 5 MG capsule, Take 5 mg by mouth at bedtime., Disp: , Rfl:    Physical Exam    HEENT: sclera anicteric, oral mucosa moist without lesions  Neck: supple, no thyromegaly, JVD or lymphadenopathy  Cardiac: RRR without murmurs, S1S2 heard, no peripheral edema  Pulm: clear to auscultation bilaterally, normal RR and effort noted  Abdomen: soft, tenderness over right anterior lower rib cage, no abdominal tenderness, with active bowel sounds. No guarding or palpable hepatosplenomegaly.  Skin; warm and dry, no jaundice or rash  Recent Labs: CBC Latest Ref Rng & Units 05/25/2014 05/25/2014  02/22/2014  WBC 4.0 - 10.5 K/uL 6.3 8.0 9.2  Hemoglobin 12.0 - 15.0 g/dL 13.4 15.2(H) 14.5  Hematocrit 36.0 - 46.0 % 39.2 45.0 44.0  Platelets 150 - 400 K/uL 215 239 260.0    CMP Latest Ref Rng & Units 05/25/2014 05/25/2014 02/22/2014  Glucose 70 - 99 mg/dL - 382(H) 179(H)  BUN 6 - 23 mg/dL - 14 14  Creatinine 0.50 - 1.10 mg/dL 0.69 0.73 0.7  Sodium 137 - 147 mEq/L - 137 139  Potassium 3.7 - 5.3 mEq/L - 4.1 4.4  Chloride 96 - 112 mEq/L - 95(L) 97  CO2 19 - 32 mEq/L - 26 30  Calcium 8.4 - 10.5 mg/dL - 9.4 9.9  Total Protein 6.0 - 8.3 g/dL - - -  Total Bilirubin 0.3 - 1.2 mg/dL - - -  Alkaline Phos 39 - 117 U/L - - -  AST 0 - 37 U/L - - -  ALT 0 - 35 U/L - - -   She reports visit with her PCP every 3 months, and states that her hemoglobin A1c has been declining over the last couple years and was most recently about 8.  Hepatic Function Latest Ref Rng & Units 09/27/2010 08/24/2010 03/05/2009  Total Protein 6.0 - 8.3 g/dL 6.9 7.0 6.8  Albumin 3.5 - 5.2 g/dL 3.8 3.8 3.5  AST 0 - 37 U/L 59(H) 52(H) 32  ALT 0 - 35 U/L 65(H) 68(H) 44(H)  Alk Phosphatase 39 - 117 U/L 221(H) 119(H) 143(H)  Total Bilirubin 0.3 - 1.2 mg/dL 0.6 0.9 0.7  Bilirubin, Direct 0.0 - 0.3 mg/dL 0.1 - -      Radiologic studies: CT abdomen and pelvis June 2015 report was reviewed. Status post cholecystectomy, no cause for abdominal pain seen at that time. Fatty liver was seen  '@ASSESSMENTPLANBEGIN'$ @ Assessment: Encounter Diagnoses  Name Primary?  . Gastroparesis   . Chronic idiopathic constipation Yes  . RUQ pain   . Fibromyalgia   . Gastroesophageal reflux disease without esophagitis    A multitude of GI symptoms. It seems like her gastroparesis has been under fairly good control, though she does report dietary indiscretions such as a Egg and bacon croissant for breakfast this morning. Her is likely related to medicine side effect, diabetes, diet, and physical activity. Her pain is musculoskeletal and  probably related to fibromyalgia. It does not seem GI in origin from the history or exam. Abnormal LFTs appear to be consistent with known fatty liver, her pain does not sound like retained CBD stone.   Plan: LFTs were obtained after the office visit today, and are resulted above. Gastroparesis diet information given Instructions for stool softener and MiraLAX given  Total time 30 minutes, over half spent in counseling and coordination of care.   Estill Cotta  Danis III

## 2016-09-02 NOTE — Patient Instructions (Signed)
If you are age 65 or older, your body mass index should be between 23-30. Your Body mass index is 31.12 kg/m. If this is out of the aforementioned range listed, please consider follow up with your Primary Care Provider.  If you are age 36 or younger, your body mass index should be between 19-25. Your Body mass index is 31.12 kg/m. If this is out of the aformentioned range listed, please consider follow up with your Primary Care Provider.   Your physician has requested that you go to the basement for the following lab work before leaving today: Liver function panel  Colonoscopy due in 2020.  Follow up in one year.  Thank you for choosing [facility]  Dr Wilfrid Lund III

## 2016-09-05 ENCOUNTER — Encounter: Payer: Self-pay | Admitting: Neurology

## 2016-09-05 ENCOUNTER — Ambulatory Visit (INDEPENDENT_AMBULATORY_CARE_PROVIDER_SITE_OTHER): Payer: Medicare Other | Admitting: Neurology

## 2016-09-05 VITALS — BP 162/78 | HR 73 | Resp 18 | Ht 65.0 in | Wt 186.0 lb

## 2016-09-05 DIAGNOSIS — R51 Headache: Secondary | ICD-10-CM

## 2016-09-05 DIAGNOSIS — E538 Deficiency of other specified B group vitamins: Secondary | ICD-10-CM

## 2016-09-05 DIAGNOSIS — G4486 Cervicogenic headache: Secondary | ICD-10-CM

## 2016-09-05 DIAGNOSIS — R4789 Other speech disturbances: Secondary | ICD-10-CM | POA: Diagnosis not present

## 2016-09-05 NOTE — Progress Notes (Signed)
Reason for visit: Word finding problems  Referring physician: Dr. Oswaldo Done is a 66 y.o. female  History of present illness:  Ms. Mcmonagle is a 66 year old right-handed white female with a history of diabetes. The patient was seen in 2014 for what was felt to be cervicogenic headache. She has had headaches off and on, the headaches have been daily since the beginning of January 2018. The patient however has been having other problems that include difficulty with word finding that began in early 2017. These issues have gradually worsened over time. The patient may have difficulty completing a sentence as she cannot find the right word for something. She denies any significant issues with general memory, she has no problems with driving or getting lost. The patient has no problems with remembering recent events. She may misplace things about the house on occasion. She denies any issues keeping up with her finances or keeping up with medications or appointments. She continues to have some neck discomfort and occasional dizziness. She may have some right arm numbness and discomfort coming out of the neck. She denies any significant issues with balance or with falls, but she does have some mild gait instability. She has some numbness and burning in the feet that may be worse at nighttime. She denies any issues controlling the bowels or the bladder. The patient is sent to this office for an evaluation.  Past Medical History:  Diagnosis Date  . Anxiety   . Arthritis    "knees" (05/25/2014)  . Bursitis of knee    "both"  . Chronic back pain   . Depression   . Diabetes mellitus type II   . Diverticulitis   . Fatty liver   . Fibromyalgia   . Gastric polyp    hyperplastic  . Gastroparesis    "recently dx'd" (05/25/2014)  . GERD (gastroesophageal reflux disease)   . H/O hiatal hernia   . [id number])    "monthly" (05/25/2014)  . Heart attack 2014   "mild"  . Hx of  gastritis   . Hyperlipidemia   . Hypertension   . Irritable bowel syndrome (IBS)   . Migraine without aura, without mention of intractable migraine without mention of status migrainosus    "related to allergies; have them in the spring and fall" (05/25/2014)  . Obesity   . Pancreatic cyst    peripancreatic cystic lesion  . Renal cyst   . Sleep apnea    "wore mask; took it off in my sleep; quit wearing it" (05/25/2014)  . Sprain of neck 03/18/2013  . Uterine cancer ([place]) dx'd 2000   surg only  . Vasculitis ([place])    "irritates my legs"    Past Surgical History:  Procedure Laterality Date  . APPENDECTOMY  ~ 1967  . BREAST CYST EXCISION Right 1990  . LAPAROSCOPIC CHOLECYSTECTOMY  1990  . LEFT HEART CATHETERIZATION WITH CORONARY ANGIOGRAM N/A 05/26/2014   Procedure: LEFT HEART CATHETERIZATION WITH CORONARY ANGIOGRAM;  Surgeon: Troy Sine, MD;  Location: [facility] CATH LAB;  Service: Cardiovascular;  Laterality: N/A;  . SINUS SURGERY WITH INSTATRAK  2000  . TUBAL LIGATION  ~ 1982  . VAGINAL HYSTERECTOMY  2000    Family History  Problem Relation Age of Onset  . Breast cancer Sister   . Colon polyps Father   . Heart disease Father   . Colon cancer Maternal Uncle   . Ovarian cancer Maternal Aunt   . Stomach cancer Maternal Aunt   .  Diabetes Maternal Aunt   . Heart disease Maternal Uncle   . Heart disease      Grandparents  . Irritable bowel syndrome Daughter     Social history:  reports that she has never smoked. She has never used smokeless tobacco. She reports that she does not drink alcohol or use drugs.  Medications:  Prior to Admission medications   Medication Sig Start Date End Date Taking? Authorizing Provider  acetaminophen (TYLENOL) 500 MG tablet Take 500 mg by mouth every 6 (six) hours as needed for headache.   Yes Historical Provider, MD  ALPRAZolam Duanne Moron) 0.5 MG tablet Take 0.5 mg by mouth at bedtime.   Yes Historical Provider, MD  atorvastatin (LIPITOR) 20 MG  tablet Take 20 mg by mouth daily.   Yes Historical Provider, MD  buPROPion (WELLBUTRIN XL) 300 MG 24 hr tablet Take 300 mg by mouth daily.   Yes Historical Provider, MD  Canagliflozin (INVOKANA) 300 MG TABS Take 300 mg by mouth daily.   Yes Historical Provider, MD  Cholecalciferol (VITAMIN D3) 1000 UNITS CAPS Take 1 capsule by mouth at bedtime.    Yes Historical Provider, MD  Cyanocobalamin (B-12 PO) Take 1 tablet by mouth at bedtime.   Yes Historical Provider, MD  fish oil-omega-3 fatty acids 1000 MG capsule Take 1 g by mouth at bedtime.    Yes Historical Provider, MD  insulin glargine (LANTUS) 100 UNIT/ML injection Inject 20 Units into the skin every morning.    Yes Historical Provider, MD  irbesartan (AVAPRO) 300 MG tablet Take 300 mg by mouth daily.    Yes Historical Provider, MD  loratadine (CLARITIN) 10 MG tablet Take 10 mg by mouth daily as needed.    Yes Historical Provider, MD  metoprolol succinate (TOPROL-XL) 50 MG 24 hr tablet Take 50 mg by mouth daily. Take with or immediately following a meal.   Yes Historical Provider, MD  MINIVELLE 0.025 MG/24HR Apply 1 patch topically 2 (two) times a week. 08/12/16  Yes Historical Provider, MD  pregabalin (LYRICA) 50 MG capsule Take 50 mg by mouth 2 (two) times daily.   Yes Historical Provider, MD  Probiotic Product (RESTORA PO) Take 1 capsule by mouth daily.    Yes Historical Provider, MD  RABEprazole (ACIPHEX) 20 MG tablet Take 1 tablet (20 mg total) by mouth 2 (two) times daily. 04/22/12  Yes Lafayette Dragon, MD  tamsulosin (FLOMAX) 0.4 MG CAPS capsule Take 0.4 mg by mouth daily. 07/17/16  Yes Historical Provider, MD  traMADol (ULTRAM) 50 MG tablet Take 100 mg by mouth 2 (two) times daily. Has been taking two in the morning and one at night past few days.   Yes Historical Provider, MD  venlafaxine XR (EFFEXOR-XR) 150 MG 24 hr capsule Take 150 mg by mouth 2 (two) times daily.   Yes Historical Provider, MD  zaleplon (SONATA) 5 MG capsule Take 5 mg by  mouth at bedtime.   Yes Historical Provider, MD      Allergies  Allergen Reactions  . Aspirin Other (See Comments)    Reaction: Vasculitis per MD  . Codeine Rash    REACTION: "makes her itch"  . Naproxen Nausea Only    Reaction: Makes stomach upset/irritated.  . Sulfonamide Derivatives Rash    REACTION: itching    ROS:  Out of a complete 14 system review of symptoms, the patient complains only of the following symptoms, and all other reviewed systems are negative.  Fatigue Constipation, diarrhea Restless legs, insomnia, sleep  apnea, frequent waking, daytime sleepiness, snoring, sleep talking Difficulty urinating Joint pain, back pain, achy muscles, walking difficulty, neck pain, neck stiffness Dizziness, headache, numbness, speech difficulty, sleepiness Depression, anxiety  Blood pressure (!) 162/78, pulse 73, resp. rate 18, height 5\' 5"  (1.651 m), weight 186 lb (84.4 kg).  Physical Exam  General: The patient is alert and cooperative at the time of the examination. The patient is moderately obese.  Eyes: Pupils are equal, round, and reactive to light. Discs are flat bilaterally.  Neck: The neck is supple, no carotid bruits are noted.  Respiratory: The respiratory examination is clear.  Cardiovascular: The cardiovascular examination reveals a regular rate and rhythm, no obvious murmurs or rubs are noted.  Skin: Extremities are without significant edema.  Neurologic Exam  Mental status: The patient is alert and oriented x 3 at the time of the examination. The patient has apparent normal recent and remote memory, with an apparently normal attention span and concentration ability. Mini-Mental Status Examination done today shows a total score of 30/30.  Cranial nerves: Facial symmetry is present. There is good sensation of the face to pinprick and soft touch bilaterally. The strength of the facial muscles and the muscles to head turning and shoulder shrug are normal  bilaterally. Speech is well enunciated, no aphasia or dysarthria is noted. Extraocular movements are full. Visual fields are full. The tongue is midline, and the patient has symmetric elevation of the soft palate. No obvious hearing deficits are noted.  Motor: The motor testing reveals 5 over 5 strength of all 4 extremities. Good symmetric motor tone is noted throughout.  Sensory: Sensory testing is intact to pinprick, soft touch, vibration sensation, and position sense on all 4 extremities, with exception of a stocking pattern pinprick sensory deficit up to the knees bilaterally. No evidence of extinction is noted.  Coordination: Cerebellar testing reveals good finger-nose-finger and heel-to-shin bilaterally.  Gait and station: Gait is normal. Tandem gait is slightly unsteady. Romberg is negative. No drift is seen.  Reflexes: Deep tendon reflexes are symmetric, but are depressed bilaterally. Toes are downgoing bilaterally.   Assessment/Plan:  1. Cervicogenic headache  2. Word finding difficulties  3. Mild diabetic peripheral neuropathy  The patient is having some issues with word finding that is new for her. Word finding issues may be associated with a more global memory disorder, but the patient does not yet indicate any significant issues in this regard. I suppose an early primary progressive aphasia needs to be considered. The patient will be set up for MRI evaluation of the brain, she will have blood work done today. She will follow-up in 6 months and we will reevaluate the word finding issues at that time.  Jill Alexanders MD 09/05/2016 2:42 PM  [facility] [address] [place], [place] [address]  Phone [phone] Fax [phone]

## 2016-09-05 NOTE — Patient Instructions (Signed)
  We will check blood work today and get MRI of the brain. 

## 2016-09-06 ENCOUNTER — Ambulatory Visit (HOSPITAL_COMMUNITY)
Admission: RE | Admit: 2016-09-06 | Discharge: 2016-09-06 | Disposition: A | Discharge status: Home / Self Care | Payer: Medicare Other | Source: Ambulatory Visit | Attending: Internal Medicine | Admitting: Internal Medicine

## 2016-09-06 ENCOUNTER — Encounter (HOSPITAL_COMMUNITY): Payer: Self-pay

## 2016-09-06 DIAGNOSIS — R079 Chest pain, unspecified: Secondary | ICD-10-CM | POA: Insufficient documentation

## 2016-09-06 DIAGNOSIS — I700 Atherosclerosis of aorta: Secondary | ICD-10-CM | POA: Diagnosis not present

## 2016-09-06 DIAGNOSIS — R002 Palpitations: Secondary | ICD-10-CM | POA: Insufficient documentation

## 2016-09-06 LAB — POCT I-STAT CREATININE: Creatinine, Ser: 0.7 mg/dL (ref 0.44–1.00)

## 2016-09-06 MED ORDER — NITROGLYCERIN 0.4 MG SL SUBL
0.8000 mg | SUBLINGUAL_TABLET | Freq: Once | SUBLINGUAL | Status: AC
  Administered 2016-09-06: 0.8 mg via SUBLINGUAL

## 2016-09-06 MED ORDER — IOPAMIDOL (ISOVUE-370) INJECTION 76%
INTRAVENOUS | Status: AC
  Administered 2016-09-06: 80 mL
  Filled 2016-09-06: qty 100

## 2016-09-06 MED ORDER — NITROGLYCERIN 0.4 MG SL SUBL
SUBLINGUAL_TABLET | SUBLINGUAL | Status: AC
  Administered 2016-09-06: 0.8 mg via SUBLINGUAL
  Filled 2016-09-06: qty 2

## 2016-09-07 LAB — RPR: RPR Ser Ql: NONREACTIVE

## 2016-09-07 LAB — COPPER, SERUM: Copper: 123 ug/dL (ref 72–166)

## 2016-09-07 LAB — VITAMIN B12: Vitamin B-12: 1648 pg/mL — ABNORMAL HIGH (ref 232–1245)

## 2016-09-07 LAB — SEDIMENTATION RATE: Sed Rate: 11 mm/hr (ref 0–40)

## 2016-09-09 DIAGNOSIS — I872 Venous insufficiency (chronic) (peripheral): Secondary | ICD-10-CM | POA: Diagnosis not present

## 2016-09-09 DIAGNOSIS — L959 Vasculitis limited to the skin, unspecified: Secondary | ICD-10-CM | POA: Diagnosis not present

## 2016-09-09 DIAGNOSIS — L739 Follicular disorder, unspecified: Secondary | ICD-10-CM | POA: Diagnosis not present

## 2016-09-10 ENCOUNTER — Telehealth: Payer: Self-pay

## 2016-09-10 NOTE — Telephone Encounter (Signed)
-----   Message from Kathrynn Ducking, MD sent at 09/08/2016  5:59 PM EST -----  The blood work results are unremarkable. Please call the patient.  ----- Message ----- From: Lavone Neri Lab Results In Sent: 09/06/2016   7:42 AM To: Kathrynn Ducking, MD

## 2016-09-10 NOTE — Telephone Encounter (Signed)
Called pt w/ unremarkable lab results. Verbalized understanding and appreciation for call. 

## 2016-09-12 ENCOUNTER — Telehealth: Payer: Self-pay | Admitting: *Deleted

## 2016-09-12 NOTE — Telephone Encounter (Signed)
Called and left voice mail for patient to call and schedule follow up appointment with Dr. Debara Pickett for test results

## 2016-09-26 DIAGNOSIS — E118 Type 2 diabetes mellitus with unspecified complications: Secondary | ICD-10-CM | POA: Diagnosis not present

## 2016-09-26 DIAGNOSIS — I10 Essential (primary) hypertension: Secondary | ICD-10-CM | POA: Diagnosis not present

## 2016-09-26 DIAGNOSIS — I359 Nonrheumatic aortic valve disorder, unspecified: Secondary | ICD-10-CM | POA: Diagnosis not present

## 2016-10-02 ENCOUNTER — Encounter (HOSPITAL_BASED_OUTPATIENT_CLINIC_OR_DEPARTMENT_OTHER): Payer: Medicare Other

## 2016-10-03 ENCOUNTER — Ambulatory Visit (HOSPITAL_BASED_OUTPATIENT_CLINIC_OR_DEPARTMENT_OTHER): Payer: Medicare Other | Attending: Internal Medicine | Admitting: Cardiovascular Disease

## 2016-10-03 VITALS — Ht 65.0 in | Wt 185.0 lb

## 2016-10-03 DIAGNOSIS — G4730 Sleep apnea, unspecified: Secondary | ICD-10-CM

## 2016-10-03 DIAGNOSIS — R0902 Hypoxemia: Secondary | ICD-10-CM | POA: Diagnosis not present

## 2016-10-03 DIAGNOSIS — G4733 Obstructive sleep apnea (adult) (pediatric): Secondary | ICD-10-CM | POA: Insufficient documentation

## 2016-10-09 NOTE — Procedures (Signed)
Patient Name: Alexis [name], Alexis [name] Date: 10/03/2016 Gender: Female D.O.B: 06/19/1951 Age (years): 12 Referring Provider: Nadean Corwin Hilty Height (inches): 65 Interpreting Physician: Shelva Majestic MD, ABSM Weight (lbs): 185 RPSGT: Earney [place] BMI: 31 MRN: [id number] Neck Size: 15.75  CLINICAL INFORMATION Sleep Study Type: NPSG  Indication for sleep study: OSA  Epworth Sleepiness Score: 5  SLEEP STUDY TECHNIQUE As per the AASM Manual for the Scoring of Sleep and Associated Events v2.3 (April 2016) with a hypopnea requiring 4% desaturations.  The channels recorded and monitored were frontal, central and occipital EEG, electrooculogram (EOG), submentalis EMG (chin), nasal and oral airflow, thoracic and abdominal wall motion, anterior tibialis EMG, snore microphone, electrocardiogram, and pulse oximetry.  MEDICATIONS acetaminophen (TYLENOL) 500 MG tablet ALPRAZolam (XANAX) 0.5 MG tablet atorvastatin (LIPITOR) 20 MG tablet buPROPion (WELLBUTRIN XL) 300 MG 24 hr tablet Canagliflozin (INVOKANA) 300 MG TABS Cholecalciferol (VITAMIN D3) 1000 UNITS CAPS Cyanocobalamin (B-12 PO) fish oil-omega-3 fatty acids 1000 MG capsule insulin glargine (LANTUS) 100 UNIT/ML injection irbesartan (AVAPRO) 300 MG tablet loratadine (CLARITIN) 10 MG tablet metoprolol succinate (TOPROL-XL) 50 MG 24 hr tablet MINIVELLE 0.025 MG/24HR pregabalin (LYRICA) 50 MG capsule Probiotic Product (RESTORA PO) RABEprazole (ACIPHEX) 20 MG tablet tamsulosin (FLOMAX) 0.4 MG CAPS capsule traMADol (ULTRAM) 50 MG tablet venlafaxine XR (EFFEXOR-XR) 150 MG 24 hr capsule zaleplon (SONATA) 5 MG capsule  Medications self-administered by patient taken the night of the study : XANAX, B-12, METOPROLOL SUCCINATE, MINIVELLE, ACIPHEX, TRAMADOL, EFFEXOR XR, SONATA  SLEEP ARCHITECTURE The study was initiated at 11:30:43 PM and ended at 5:37:20 AM.  Sleep onset time was 55.4 minutes and the sleep efficiency was  45.7%. The total sleep time was 167.5 minutes. Wake after sleep onset (WASO) was 143.8 minutes.  Stage REM latency was 130.0 minutes.  The patient spent 5.67% of the night in stage N1 sleep, 73.13% in stage N2 sleep, 0.90% in stage N3 and 20.30% in REM.  Alpha intrusion was absent.  Supine sleep was 4.78%.  RESPIRATORY PARAMETERS The overall apnea/hypopnea index (AHI) was 26.1 per hour. There were 1 total apneas, including 1 obstructive, 0 central and 0 mixed apneas. There were 72 hypopneas and 2 RERAs.  The AHI during Stage REM sleep was 21.2 per hour.  AHI while supine was 0.0 per hour.  The mean oxygen saturation was 89.64%. The minimum SpO2 during sleep was 77.00%.  Loud snoring was noted during this study.  CARDIAC DATA The 2 lead EKG demonstrated sinus rhythm. The mean heart rate was 65.34 beats per minute. Other EKG findings include: None.  LEG MOVEMENT DATA The total PLMS were 0 with a resulting PLMS index of 0.00. Associated arousal with leg movement index was 0.0 .  IMPRESSIONS - Moderate obstructive sleep apnea (AHI = 26.1/h);  AHI during REM sleep  21.2/h. - No significant central sleep apnea occurred during this study (CAI = 0.0/h). - Severe oxygen desaturation to a nadir of 77%. - The patient snored with Loud snoring volume. - No cardiac abnormalities were noted during this study. - Clinically significant periodic limb movements did not occur during sleep. No significant associated arousals.  DIAGNOSIS - Obstructive Sleep Apnea (327.23 [G47.33 ICD-10]) - Nocturnal Hypoxemia (327.26 [G47.36 ICD-10])  RECOMMENDATIONS - Recommend therapeutic CPAP titration to determine optimal pressure required to alleviate sleep disordered breathing. - Efforts should be made to optimize nasal and orophayrngeal patency. - Avoid alcohol, sedatives and other CNS depressants that may worsen sleep apnea and disrupt normal sleep architecture. - Sleep hygiene  should be reviewed to  assess factors that may improve sleep quality. - Weight management and regular exercise should be initiated or continued if appropriate.  [Electronically signed] 10/09/2016 02:44 PM  Shelva Majestic MD, ABSM Diplomate, American Board of Sleep Medicine   NPI: [id number] [place] PH: [phone]   FX: [phone] [place]

## 2016-10-10 ENCOUNTER — Ambulatory Visit (INDEPENDENT_AMBULATORY_CARE_PROVIDER_SITE_OTHER): Payer: Medicare Other | Admitting: Internal Medicine

## 2016-10-10 ENCOUNTER — Encounter: Payer: Self-pay | Admitting: Internal Medicine

## 2016-10-10 VITALS — BP 155/78 | HR 67 | Ht 65.0 in | Wt 185.4 lb

## 2016-10-10 DIAGNOSIS — G4730 Sleep apnea, unspecified: Secondary | ICD-10-CM

## 2016-10-10 DIAGNOSIS — R002 Palpitations: Secondary | ICD-10-CM

## 2016-10-10 DIAGNOSIS — E785 Hyperlipidemia, unspecified: Secondary | ICD-10-CM | POA: Diagnosis not present

## 2016-10-10 DIAGNOSIS — I10 Essential (primary) hypertension: Secondary | ICD-10-CM | POA: Diagnosis not present

## 2016-10-10 NOTE — Patient Instructions (Signed)
Your physician recommends that you return for lab work FASTING to check cholesterol   Your physician wants you to follow-up in: 6 months with Dr. Hilty. You will receive a reminder letter in the mail two months in advance. If you don't receive a letter, please call our office to schedule the follow-up appointment.  
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
## 2016-10-19 ENCOUNTER — Ambulatory Visit
Admission: RE | Admit: 2016-10-19 | Discharge: 2016-10-19 | Disposition: A | Discharge status: Home / Self Care | Payer: Medicare Other | Source: Ambulatory Visit | Attending: Neurology | Admitting: Neurology

## 2016-10-19 DIAGNOSIS — R4789 Other speech disturbances: Secondary | ICD-10-CM | POA: Diagnosis not present

## 2016-10-19 DIAGNOSIS — R51 Headache: Secondary | ICD-10-CM

## 2016-10-19 DIAGNOSIS — G4486 Cervicogenic headache: Secondary | ICD-10-CM

## 2016-10-19 DIAGNOSIS — R479 Unspecified speech disturbances: Secondary | ICD-10-CM | POA: Diagnosis not present

## 2016-10-20 ENCOUNTER — Telehealth: Payer: Self-pay | Admitting: Neurology

## 2016-10-20 NOTE — Telephone Encounter (Signed)
I called the patient. The MRI of the brain shows minimal SVD and Atrophy, ? If the patient may be developing primary progressive aphasia.   MRI brain 10/17/16:  IMPRESSION:  This MRI of the brain without contrast shows the following: 1.    A few scattered T2/FLAIR hyperintense foci in the pons and hemispheres consistent with mild chronic microvascular ischemic change. 2.    Brain volume is normal for age. 3.    There are no acute findings.

## 2016-10-23 DIAGNOSIS — E785 Hyperlipidemia, unspecified: Secondary | ICD-10-CM | POA: Diagnosis not present

## 2016-10-24 LAB — LIPID PANEL
Cholesterol: 142 mg/dL (ref <200)
HDL: 52 mg/dL (ref >50)
LDL Cholesterol: 68 mg/dL (ref <100)
Total CHOL/HDL Ratio: 2.7 Ratio (ref <5.0)
Triglycerides: 109 mg/dL (ref <150)
VLDL: 22 mg/dL (ref <30)

## 2016-11-05 ENCOUNTER — Other Ambulatory Visit: Payer: Self-pay | Admitting: *Deleted

## 2016-11-05 ENCOUNTER — Telehealth: Payer: Self-pay | Admitting: *Deleted

## 2016-11-05 DIAGNOSIS — G4733 Obstructive sleep apnea (adult) (pediatric): Secondary | ICD-10-CM

## 2016-11-05 NOTE — Progress Notes (Signed)
Left message to return a call. 

## 2016-11-05 NOTE — Telephone Encounter (Signed)
Left message for patient to return a call to discuss sleep study results. 

## 2016-11-05 NOTE — Telephone Encounter (Signed)
Patient infored of sleep study results and recommendations. CPAP titration ordered.

## 2016-11-05 NOTE — Progress Notes (Signed)
Patient notified of results and recommendations.

## 2016-12-20 DIAGNOSIS — E789 Disorder of lipoprotein metabolism, unspecified: Secondary | ICD-10-CM | POA: Diagnosis not present

## 2016-12-20 DIAGNOSIS — I10 Essential (primary) hypertension: Secondary | ICD-10-CM | POA: Diagnosis not present

## 2016-12-20 DIAGNOSIS — E118 Type 2 diabetes mellitus with unspecified complications: Secondary | ICD-10-CM | POA: Diagnosis not present

## 2016-12-24 ENCOUNTER — Ambulatory Visit (HOSPITAL_BASED_OUTPATIENT_CLINIC_OR_DEPARTMENT_OTHER): Payer: Medicare Other | Attending: Cardiovascular Disease | Admitting: Cardiovascular Disease

## 2016-12-24 VITALS — Ht 65.0 in | Wt 184.0 lb

## 2016-12-24 DIAGNOSIS — G4733 Obstructive sleep apnea (adult) (pediatric): Secondary | ICD-10-CM | POA: Insufficient documentation

## 2016-12-24 DIAGNOSIS — E118 Type 2 diabetes mellitus with unspecified complications: Secondary | ICD-10-CM | POA: Diagnosis not present

## 2017-01-14 DIAGNOSIS — G4730 Sleep apnea, unspecified: Secondary | ICD-10-CM | POA: Diagnosis not present

## 2017-01-14 DIAGNOSIS — E789 Disorder of lipoprotein metabolism, unspecified: Secondary | ICD-10-CM | POA: Diagnosis not present

## 2017-01-14 DIAGNOSIS — E118 Type 2 diabetes mellitus with unspecified complications: Secondary | ICD-10-CM | POA: Diagnosis not present

## 2017-01-17 ENCOUNTER — Telehealth: Payer: Self-pay | Admitting: Internal Medicine

## 2017-01-17 NOTE — Telephone Encounter (Signed)
New Message  Pt call requesting to speak with RN about getting results for sleep study. Please call back to discuss

## 2017-01-17 NOTE — Telephone Encounter (Signed)
Patient aware that results are pending MD review She asked if her office note/medical problem list are accessible via [place] I am not sure of this She states she would like access to this, in the event of an emergency for EMS to know her meds/medical conditions Advised patient she can print a list of med and her conditions and keep in wallet for easy access for emergency personnel if such situation should arise.  She will await call from [place] with CPAP titration outcomes.

## 2017-01-19 NOTE — Procedures (Signed)
Patient Name: Alexis [name], Alexis [name] Date: 12/24/2016 Gender: Female D.O.B: 06/22/1951 Age (years): 84 Referring Provider: Shelva Majestic MD, ABSM Height (inches): 65 Interpreting Physician: Shelva Majestic MD, ABSM Weight (lbs): 184 RPSGT: Madelon Lips BMI: 31 MRN: [id number] Neck Size: 15.75  CLINICAL INFORMATION The patient is referred for a CPAP titration to treat sleep apnea.  Date of NPSG:  10/03/2016:  AHI 26.1/h; RDI 26.9/h; O2 desaturation toa 77%.  SLEEP STUDY TECHNIQUE As per the AASM Manual for the Scoring of Sleep and Associated Events v2.3 (April 2016) with a hypopnea requiring 4% desaturations.  The channels recorded and monitored were frontal, central and occipital EEG, electrooculogram (EOG), submentalis EMG (chin), nasal and oral airflow, thoracic and abdominal wall motion, anterior tibialis EMG, snore microphone, electrocardiogram, and pulse oximetry. Continuous positive airway pressure (CPAP) was initiated at the beginning of the study and titrated to treat sleep-disordered breathing.  MEDICATIONS acetaminophen (TYLENOL) 500 MG tablet ALPRAZolam (XANAX) 0.5 MG tablet atorvastatin (LIPITOR) 20 MG tablet buPROPion (WELLBUTRIN XL) 300 MG 24 hr tablet Canagliflozin (INVOKANA) 300 MG TABS Cholecalciferol (VITAMIN D3) 1000 UNITS CAPS Cyanocobalamin (B-12 PO) FARXIGA 10 MG TABS tablet fish oil-omega-3 fatty acids 1000 MG capsule insulin glargine (LANTUS) 100 UNIT/ML injection irbesartan (AVAPRO) 300 MG tablet loratadine (CLARITIN) 10 MG tablet metoprolol succinate (TOPROL-XL) 50 MG 24 hr tablet MINIVELLE 0.025 MG/24HR pregabalin (LYRICA) 50 MG capsule Probiotic Product (RESTORA PO) RABEprazole (ACIPHEX) 20 MG tablet tamsulosin (FLOMAX) 0.4 MG CAPS capsule traMADol (ULTRAM) 50 MG tablet venlafaxine XR (EFFEXOR-XR) 150 MG 24 hr capsule zaleplon (SONATA) 5 MG capsule  Medications self-administered by patient taken the night of the study : XANAX, B-12,  METOPROLOL SUCCINATE, MINIVELLE, ACIPHEX, TRAMADOL, EFFEXOR XR, SONATA  TECHNICIAN COMMENTS Comments added by technician: NIGHT MEDICATIONS TAKEN AT 2200. PATIENT TOLERATED CPAP WITHOUT DIFFICULTY. BATHROOM BREAK 1X  Comments added by scorer: N/A  RESPIRATORY PARAMETERS Optimal PAP Pressure (cm): 8 AHI at Optimal Pressure (/hr): 0.0 Overall Minimal O2 (%): 90.00 Supine % at Optimal Pressure (%): 0 Minimal O2 at Optimal Pressure (%): 92.0    SLEEP ARCHITECTURE The study was initiated at 10:49:40 PM and ended at 4:51:39 AM.  Sleep onset time was 9.7 minutes and the sleep efficiency was 22.5%. The total sleep time was 81.5 minutes.  The patient spent 46.63% of the night in stage N1 sleep, 42.94% in stage N2 sleep, 4.29% in stage N3 and 6.13% in REM.Stage REM latency was 316.0 minutes  Wake after sleep onset was 270.8. Alpha intrusion was absent. Supine sleep was 0.61%.  CARDIAC DATA The 2 lead EKG demonstrated sinus rhythm. The mean heart rate was 64.64 beats per minute. Other EKG findings include: None.  LEG MOVEMENT DATA The total Periodic Limb Movements of Sleep (PLMS) were 0. The PLMS index was 0.00. A PLMS index of <15 is considered normal in adults.  IMPRESSIONS - The optimal PAP pressure was 8 cm of water.  AHI at 8 cm water pressure was 0 with the oxygen saturation nadir of 92%. - Central sleep apnea was not noted during this titration (CAI = 0.0/h). - Mild oxygen desaturations were observed during this titration (min O2 = 90.00%). - Reduced sleep efficiency at only 22.5%. - The patient snored with Soft snoring volume during this titration study. - No cardiac abnormalities were observed during this study. - Clinically significant periodic limb movements were not noted during this study. Arousals associated with PLMs were rare.  DIAGNOSIS - Obstructive Sleep Apnea (327.23 [G47.33 ICD-10])  RECOMMENDATIONS -  Recommend an initial trial of CPAP therapy with EPR at 8 cm H2O  with  heated humidification.  A Small size Fisher&Paykel Full Face Mask Simplus mask was used for the titration. - Efforts should be made to optimize nasal and oral pharyngeal patency. - Avoid alcohol, sedatives and other CNS depressants that may worsen sleep apnea and disrupt normal sleep architecture. - Sleep hygiene should be reviewed to assess factors that may improve sleep quality. - Weight management and regular exercise should be initiated or continued. - Recommend a download be obtained in 30 days and sleep clinic evaluation after 4 weeks of therapy -    [Electronically signed] 01/19/2017 07:37 PM  Shelva Majestic MD, [facility], ABSM Diplomate, American Board of Sleep Medicine    NPI: [id number] [place] PH: [phone]   FX: [phone] [place]

## 2017-01-22 ENCOUNTER — Telehealth: Payer: Self-pay | Admitting: *Deleted

## 2017-01-22 NOTE — Telephone Encounter (Signed)
Patient notified CPAP referral sent to [facility]. If she has not heard from them by the end of the month I asked her to call me back, so that I can follow up on the referral. Patient voiced understanding.

## 2017-01-22 NOTE — Telephone Encounter (Signed)
Patient notified referral sent to Alexis [name] for CPAP set up.

## 2017-01-28 ENCOUNTER — Emergency Department (HOSPITAL_COMMUNITY): Payer: Medicare Other

## 2017-01-28 ENCOUNTER — Encounter (HOSPITAL_COMMUNITY): Payer: Self-pay | Admitting: Obstetrics and Gynecology

## 2017-01-28 ENCOUNTER — Emergency Department (HOSPITAL_COMMUNITY)
Admission: EM | Admit: 2017-01-28 | Discharge: 2017-01-28 | Disposition: A | Discharge status: Home / Self Care | Payer: Medicare Other | Attending: Emergency Medicine | Admitting: Emergency Medicine

## 2017-01-28 DIAGNOSIS — Y9301 Activity, walking, marching and hiking: Secondary | ICD-10-CM | POA: Diagnosis not present

## 2017-01-28 DIAGNOSIS — R0781 Pleurodynia: Secondary | ICD-10-CM | POA: Diagnosis not present

## 2017-01-28 DIAGNOSIS — E669 Obesity, unspecified: Secondary | ICD-10-CM | POA: Diagnosis not present

## 2017-01-28 DIAGNOSIS — S52502A Unspecified fracture of the lower end of left radius, initial encounter for closed fracture: Secondary | ICD-10-CM | POA: Insufficient documentation

## 2017-01-28 DIAGNOSIS — Y999 Unspecified external cause status: Secondary | ICD-10-CM | POA: Diagnosis not present

## 2017-01-28 DIAGNOSIS — S299XXA Unspecified injury of thorax, initial encounter: Secondary | ICD-10-CM | POA: Diagnosis not present

## 2017-01-28 DIAGNOSIS — Y929 Unspecified place or not applicable: Secondary | ICD-10-CM | POA: Insufficient documentation

## 2017-01-28 DIAGNOSIS — W108XXA Fall (on) (from) other stairs and steps, initial encounter: Secondary | ICD-10-CM | POA: Diagnosis not present

## 2017-01-28 DIAGNOSIS — S6992XA Unspecified injury of left wrist, hand and finger(s), initial encounter: Secondary | ICD-10-CM | POA: Diagnosis not present

## 2017-01-28 DIAGNOSIS — I10 Essential (primary) hypertension: Secondary | ICD-10-CM | POA: Insufficient documentation

## 2017-01-28 DIAGNOSIS — W19XXXA Unspecified fall, initial encounter: Secondary | ICD-10-CM

## 2017-01-28 DIAGNOSIS — R0789 Other chest pain: Secondary | ICD-10-CM | POA: Insufficient documentation

## 2017-01-28 DIAGNOSIS — S52515A Nondisplaced fracture of left radial styloid process, initial encounter for closed fracture: Secondary | ICD-10-CM | POA: Diagnosis not present

## 2017-01-28 DIAGNOSIS — S62617A Displaced fracture of proximal phalanx of left little finger, initial encounter for closed fracture: Secondary | ICD-10-CM | POA: Diagnosis not present

## 2017-01-28 DIAGNOSIS — Z79899 Other long term (current) drug therapy: Secondary | ICD-10-CM | POA: Insufficient documentation

## 2017-01-28 DIAGNOSIS — E119 Type 2 diabetes mellitus without complications: Secondary | ICD-10-CM | POA: Insufficient documentation

## 2017-01-28 DIAGNOSIS — M7989 Other specified soft tissue disorders: Secondary | ICD-10-CM | POA: Diagnosis not present

## 2017-01-28 DIAGNOSIS — Z794 Long term (current) use of insulin: Secondary | ICD-10-CM | POA: Diagnosis not present

## 2017-01-28 DIAGNOSIS — Z8541 Personal history of malignant neoplasm of cervix uteri: Secondary | ICD-10-CM | POA: Insufficient documentation

## 2017-01-28 DIAGNOSIS — S62647A Nondisplaced fracture of proximal phalanx of left little finger, initial encounter for closed fracture: Secondary | ICD-10-CM | POA: Insufficient documentation

## 2017-01-28 DIAGNOSIS — M25532 Pain in left wrist: Secondary | ICD-10-CM | POA: Diagnosis not present

## 2017-01-28 DIAGNOSIS — S6982XA Other specified injuries of left wrist, hand and finger(s), initial encounter: Secondary | ICD-10-CM | POA: Diagnosis present

## 2017-01-28 NOTE — ED Provider Notes (Signed)
[facility] DEPT Provider Note   CSN: [id number] Arrival date & time: 01/28/17  1206     History   Chief Complaint Chief Complaint  Patient presents with  . Arm Pain  . Fall    HPI ALTAIR STANKO is a 66 y.o. female.  HPI  Patient presents after mechanical fall that occurred yesterday. Patient recalls entire event, which occurred after she missed a step falling onto her left side. Patient landed on a handbag, and though she had mild pain initially, has had no sustained hip pain, has been ambulatory. However, she has had pain, sustained, and her left wrist, hand, and left rib cage. No dyspnea at rest, but with activity patient feels sharp severe pain along the left infracostal margin. No syncope, no fever, no cough. The wrist pain is less severe than the rib pain, but is also sustained, sore, worse with motion. No distal loss of sensation or weakness.   Past Medical History:  Diagnosis Date  . Anxiety   . Arthritis    "knees" (05/25/2014)  . Bursitis of knee    "both"  . Chronic back pain   . Depression   . Diabetes mellitus type II   . Diverticulitis   . Fatty liver   . Fibromyalgia   . Gastric polyp    hyperplastic  . Gastroparesis    "recently dx'd" (05/25/2014)  . GERD (gastroesophageal reflux disease)   . H/O hiatal hernia   . OZHYQMVH(846.9)    "monthly" (05/25/2014)  . Heart attack ([place]) 2014   "mild"  . Hx of gastritis   . Hyperlipidemia   . Hypertension   . Irritable bowel syndrome (IBS)   . Migraine without aura, without mention of intractable migraine without mention of status migrainosus    "related to allergies; have them in the spring and fall" (05/25/2014)  . Obesity   . Pancreatic cyst    peripancreatic cystic lesion  . Renal cyst   . Sleep apnea    "wore mask; took it off in my sleep; quit wearing it" (05/25/2014)  . Sprain of neck 03/18/2013  . Uterine cancer ([place]) dx'd 2000   surg only  . Vasculitis ([place])    "irritates my  legs"    Patient Active Problem List   Diagnosis Date Noted  . Word finding difficulty 09/05/2016  . Cervicogenic headache 09/05/2016  . Palpitation 08/22/2016  . Bilateral leg edema 08/22/2016  . Obesity (BMI 30-39.9) 05/25/2014  . Precordial chest pain 05/25/2014  . Migraine without aura, without mention of intractable migraine without mention of status migrainosus 03/18/2013  . Chest pain 01/29/2013  . Constipation 02/24/2012  . DM (diabetes mellitus), type 2, uncontrolled ([place]) 08/24/2010  . Hyperlipidemia 08/24/2010  . ANXIETY 08/24/2010  . Depression 08/24/2010  . Essential hypertension 08/24/2010  . GERD 08/24/2010  . IRRITABLE BOWEL SYNDROME 08/24/2010  . FATTY LIVER DISEASE 08/24/2010  . ARTHRITIS 08/24/2010  . Fibromyalgia 08/24/2010  . Sleep apnea 08/24/2010  . ABDOMINAL PAIN-RUQ 08/24/2010  . UTERINE CANCER, HX OF 08/24/2010  . GASTRITIS, HX OF 08/24/2010    Past Surgical History:  Procedure Laterality Date  . APPENDECTOMY  ~ 1967  . BREAST CYST EXCISION Right 1990  . LAPAROSCOPIC CHOLECYSTECTOMY  1990  . LEFT HEART CATHETERIZATION WITH CORONARY ANGIOGRAM N/A 05/26/2014   Procedure: LEFT HEART CATHETERIZATION WITH CORONARY ANGIOGRAM;  Surgeon: Troy Sine, MD;  Location: [facility] CATH LAB;  Service: Cardiovascular;  Laterality: N/A;  . SINUS SURGERY WITH INSTATRAK  2000  .  TUBAL LIGATION  ~ 1982  . VAGINAL HYSTERECTOMY  2000    OB History    Gravida Para Term Preterm AB Living             2   SAB TAB Ectopic Multiple Live Births                   Home Medications    Prior to Admission medications   Medication Sig Start Date End Date Taking? Authorizing Provider  acetaminophen (TYLENOL) 500 MG tablet Take 500 mg by mouth every 6 (six) hours as needed for headache.    [provider]  ALPRAZolam Duanne Moron) 0.5 MG tablet Take 0.5 mg by mouth at bedtime.    [provider]  atorvastatin (LIPITOR) 20 MG tablet Take 20 mg by mouth daily.     [provider]  buPROPion (WELLBUTRIN XL) 300 MG 24 hr tablet Take 300 mg by mouth daily.    [provider]  Canagliflozin (INVOKANA) 300 MG TABS Take 300 mg by mouth daily.    [provider]  Cholecalciferol (VITAMIN D3) 1000 UNITS CAPS Take 1 capsule by mouth at bedtime.     [provider]  Cyanocobalamin (B-12 PO) Take 1 tablet by mouth at bedtime.    [provider]  FARXIGA 10 MG TABS tablet Take 1 tablet by mouth daily. 10/09/16   [provider]  fish oil-omega-3 fatty acids 1000 MG capsule Take 1 g by mouth at bedtime.     [provider]  insulin glargine (LANTUS) 100 UNIT/ML injection Inject 20 Units into the skin every morning.     [provider]  irbesartan (AVAPRO) 300 MG tablet Take 300 mg by mouth daily.     [provider]  loratadine (CLARITIN) 10 MG tablet Take 10 mg by mouth daily as needed.     [provider]  metoprolol succinate (TOPROL-XL) 50 MG 24 hr tablet Take 100 mg by mouth daily. Take with or immediately following a meal.    [provider]  MINIVELLE 0.025 MG/24HR Apply 1 patch topically 2 (two) times a week. 08/12/16   [provider]  pregabalin (LYRICA) 50 MG capsule Take 50 mg by mouth 2 (two) times daily.    [provider]  Probiotic Product (RESTORA PO) Take 1 capsule by mouth daily.     [provider]  RABEprazole (ACIPHEX) 20 MG tablet Take 1 tablet (20 mg total) by mouth 2 (two) times daily. 04/22/12   Lafayette Dragon, MD  tamsulosin (FLOMAX) 0.4 MG CAPS capsule Take 0.4 mg by mouth daily. 07/17/16   [provider]  traMADol (ULTRAM) 50 MG tablet Take 100 mg by mouth 2 (two) times daily. Has been taking two in the morning and one at night past few days.    [provider]  venlafaxine XR (EFFEXOR-XR) 150 MG 24 hr capsule Take 150 mg by mouth 2 (two) times daily.    [provider]  zaleplon (SONATA) 5 MG  capsule Take 5 mg by mouth at bedtime.    [provider]    Family History Family History  Problem Relation Age of Onset  . Breast cancer Sister   . Colon polyps Father   . Heart disease Father   . Colon cancer Maternal Uncle   . Ovarian cancer Maternal Aunt   . Stomach cancer Maternal Aunt   . Diabetes Maternal Aunt   . Heart disease Maternal Uncle   .  Heart disease Unknown        Grandparents  . Irritable bowel syndrome Daughter     Social History Social History  Substance Use Topics  . Smoking status: Never Smoker  . Smokeless tobacco: Never Used  . Alcohol use No     Allergies   Aspirin; Codeine; Naproxen; and Sulfonamide derivatives   Review of Systems Review of Systems  Constitutional:       Per HPI, otherwise negative  HENT:       Per HPI, otherwise negative  Respiratory:       Per HPI, otherwise negative  Cardiovascular:       Per HPI, otherwise negative  Gastrointestinal: Negative for vomiting.  Endocrine:       Negative aside from HPI  Genitourinary:       Neg aside from HPI   Musculoskeletal:       Per HPI, otherwise negative  Skin: Negative.   Neurological: Negative for syncope and numbness.     Physical Exam Updated Vital Signs BP (!) 143/83   Pulse 69   Temp 98.5 F (36.9 C) (Oral)   Resp 18   Ht 5\' 5"  (1.651 m)   Wt 83.9 kg (185 lb)   SpO2 99%   BMI 30.79 kg/m   Physical Exam  Constitutional: She is oriented to person, place, and time. She appears well-developed and well-nourished. No distress.  HENT:  Head: Normocephalic and atraumatic.  Eyes: Conjunctivae and EOM are normal.  Cardiovascular: Normal rate and regular rhythm.   Pulmonary/Chest: Effort normal and breath sounds normal. No stridor. No respiratory distress.  No deformity about the left infracostal margin  Abdominal: She exhibits no distension.  Musculoskeletal: She exhibits no edema.       Arms: Neurological: She is alert and oriented to person, place,  and time. No cranial nerve deficit.  Skin: Skin is warm and dry.  Psychiatric: She has a normal mood and affect.  Nursing note and vitals reviewed.    ED Treatments / Results   Radiology Dg Ribs Unilateral W/chest Left  Result Date: 01/28/2017 CLINICAL DATA:  Left rib pain after fall yesterday. EXAM: LEFT RIBS AND CHEST - 3+ VIEW COMPARISON:  None. FINDINGS: No fracture or other bone lesions are seen involving the ribs. There is no evidence of pneumothorax or pleural effusion. Both lungs are clear. Heart size and mediastinal contours are within normal limits. Atherosclerosis of thoracic aorta is noted. IMPRESSION: Normal left ribs. No acute cardiopulmonary abnormality seen. Aortic atherosclerosis. Electronically Signed   By: Marijo Conception, M.D.   On: 01/28/2017 17:15   Dg Wrist Complete Left  Result Date: 01/28/2017 CLINICAL DATA:  [place] yesterday in the parking lot at [employer], landing on LEFT side; pain in LEFT hand extending down into wrist especially dorsally EXAM: LEFT WRIST - COMPLETE 3+ VIEW COMPARISON:  None FINDINGS: Osseous demineralization. Joint spaces preserved. Questionable nondisplaced fracture at the base of the proximal phalanx LEFT little finger. Slight widening of the scapholunate interval raising question of scapholunate ligament tear. Additionally, a nondisplaced fracture is seen at the tip of the radial styloid. No dislocation or bone destruction. IMPRESSION: Nondisplaced fracture at tip of LEFT radial styloid. Questionable nondisplaced fracture at base of proximal phalanx LEFT little finger. Question torn scapholunate ligament. Osseous demineralization. Electronically Signed   By: Lavonia Dana M.D.   On: 01/28/2017 13:32   Dg Hand Complete Left  Result Date: 01/28/2017 CLINICAL DATA:  [place] yesterday in the parking mild at  [employer], landing on LEFT side; swelling at fourth and fifth fingers, pain traveling down to LEFT wrist mostly posteriorly EXAM: LEFT HAND - COMPLETE 3+  VIEW COMPARISON:  10/24/2015 FINDINGS: Diffuse osseous demineralization. Joint spaces preserved. Questionable fracture at base of proximal phalanx LEFT little finger on oblique view, not identified on remaining views, could potentially be extending intra-articular at the fifth MCP joint. Soft tissue swelling overlying the MCP joints on the lateral view. No additional fracture, dislocation or bone destruction. IMPRESSION: Questionable nondisplaced intra-articular fracture at the base of the proximal phalanx LEFT little finger. Electronically Signed   By: Lavonia Dana M.D.   On: 01/28/2017 13:27    Procedures Procedures (including critical care time)  Medications Ordered in ED Medications - No data to display   Initial Impression / Assessment and Plan / ED Course  I have reviewed the triage vital signs and the nursing notes.  Pertinent labs & imaging results that were available during my care of the patient were reviewed by me and considered in my medical decision making (see chart for details).  Patient presents after mechanical fall that occurred yesterday, now with concern of ongoing pain in her left upper extremity, as well as left rib cage. She is awake, alert, disoriented neurovascular intact, has no evidence for pneumothorax, but with concern for rib fracture, multiple fractures, patient x-rays performed. These demonstrate both distal radius fracture and proximal left fifth digit fracture.  Patient tolerated splinting well, this was performed by our orthopedic technician. Patient discharged in stable condition.  Final Clinical Impressions(s) / ED Diagnoses   Final diagnoses:  Fall, initial encounter  Distal radius fracture, initial encounter Finger fracture, left, fifth proximal phalanx   Carmin Muskrat, MD 01/29/17 1601

## 2017-01-28 NOTE — ED Provider Notes (Signed)
5:50 PM patient is alert ambulate without difficulty not lightheaded on standing. She is comfortable in splint  Feels ready to go home. X-rays viewed by me.   Orlie Dakin, MD 01/28/17 1753

## 2017-01-28 NOTE — Discharge Instructions (Signed)
Take tramadol as needed for pain. Call Dr. Levell July office tomorrow to schedule the next available office appointment. Tell office staff that you were seen here and had x-rays performed here.

## 2017-01-28 NOTE — ED Triage Notes (Signed)
Pt states she fell yesterday at the [employer] parking lot. Pt states she landed on her left side on the cement and now her left hand is not working properly. Pt states she did hit her head but denies LOC at this time, just dizziness following the incident. Pt also reports pain on the left side below her breast. Pt denies pain in the lower extremities.

## 2017-02-01 DIAGNOSIS — M25532 Pain in left wrist: Secondary | ICD-10-CM | POA: Diagnosis not present

## 2017-02-10 DIAGNOSIS — M25532 Pain in left wrist: Secondary | ICD-10-CM | POA: Diagnosis not present

## 2017-02-10 DIAGNOSIS — M79672 Pain in left foot: Secondary | ICD-10-CM | POA: Diagnosis not present

## 2017-02-14 DIAGNOSIS — S52515D Nondisplaced fracture of left radial styloid process, subsequent encounter for closed fracture with routine healing: Secondary | ICD-10-CM | POA: Diagnosis not present

## 2017-02-14 DIAGNOSIS — S62647D Nondisplaced fracture of proximal phalanx of left little finger, subsequent encounter for fracture with routine healing: Secondary | ICD-10-CM | POA: Diagnosis not present

## 2017-02-17 DIAGNOSIS — M25532 Pain in left wrist: Secondary | ICD-10-CM | POA: Diagnosis not present

## 2017-02-19 ENCOUNTER — Telehealth: Payer: Self-pay | Admitting: Cardiovascular Disease

## 2017-02-19 NOTE — Telephone Encounter (Signed)
Closed Encounter  °

## 2017-02-21 ENCOUNTER — Encounter: Payer: Self-pay | Admitting: Cardiovascular Disease

## 2017-02-21 ENCOUNTER — Ambulatory Visit (INDEPENDENT_AMBULATORY_CARE_PROVIDER_SITE_OTHER): Payer: Medicare Other | Admitting: Cardiovascular Disease

## 2017-02-21 VITALS — BP 130/78 | HR 74 | Ht 65.0 in | Wt 183.4 lb

## 2017-02-21 DIAGNOSIS — I2510 Atherosclerotic heart disease of native coronary artery without angina pectoris: Secondary | ICD-10-CM | POA: Diagnosis not present

## 2017-02-21 DIAGNOSIS — G4733 Obstructive sleep apnea (adult) (pediatric): Secondary | ICD-10-CM | POA: Diagnosis not present

## 2017-02-21 DIAGNOSIS — I10 Essential (primary) hypertension: Secondary | ICD-10-CM

## 2017-02-21 DIAGNOSIS — E119 Type 2 diabetes mellitus without complications: Secondary | ICD-10-CM | POA: Diagnosis not present

## 2017-02-21 NOTE — Patient Instructions (Addendum)
Your physician recommends that you keep appointment with D Hilty. Be sure that he notes CPAP compliance. See Dr Claiborne Billings in 1 year for sleep.

## 2017-02-23 NOTE — Progress Notes (Signed)
Cardiology Office Note    Date:  02/23/2017   ID:  TERRIANNE CAVNESS, DOB Dec 27, 1950, MRN [id number]  PCP:  Deland Pretty, MD  Cardiologist:  Shelva Majestic, MD (sleep); Dr. Debara Pickett  Sleep clinic initial evaluation  History of Present Illness:  Alexis [name] is a 66 y.o. female who presents to sleep clinic following initiation of CPAP therapy for obstructive sleep apnea.  Ms. Stolar is followed by Dr. Debara Pickett for cardiology care.  She has a history of type 2 diabetes mellitus, hypertension, vasculitis, cardiac arrhythmia, and mild 2 vessel CAD as documented by coronary CT angiography.  She was referred for a sleep study due to concerns for obstructive sleep apnea.  This was done on 10/03/2016.  She was found to have moderate sleep apnea with an AHI of 26.1 per hour.  She had significant oxygen desaturation is 77%.  There was loud snoring.  She had reduced sleep efficiency.  She underwent a CPAP titration trial on 12/24/2016 and was titrated up to 8 cm with excellent response.  She has been on CPAP therapy since 02/07/2017.  Her DME companies [employer].  I obtained a download today from 02/09/2017 through 02/20/2017.  She is 100% compliance with usage stays.  However, she has not been on CPAP therapy for the minimum 30 days required to assess compliance.  He also is compliant with reference to greater than 4 hours of usage but is sleeping only on average 4 hours and 52 minutes.  She is set at an 8 cm water pressure. She s using a fullface mask.  Mostly she had undergone tonsillar surgery by Dr. Ernesto Rutherford.  Her AHI was excellent at 1.4.  Epworth Sleepiness Scale: Situation   Chance of Dozing/Sleeping (0 = never , 1 = slight chance , 2 = moderate chance , 3 = high chance )   sitting and reading 1   watching TV 3   sitting inactive in a public place 0   being a passenger in a motor vehicle for an hour or more 2   lying down in the afternoon 1   sitting and talking to someone 0   sitting quietly  after lunch (no alcohol) 1   while stopped for a few minutes in traffic as the driver 0   Total Score  8     Past Medical History:  Diagnosis Date  . Anxiety   . Arthritis    "knees" (05/25/2014)  . Bursitis of knee    "both"  . Chronic back pain   . Depression   . Diabetes mellitus type II   . Diverticulitis   . Fatty liver   . Fibromyalgia   . Gastric polyp    hyperplastic  . Gastroparesis    "recently dx'd" (05/25/2014)  . GERD (gastroesophageal reflux disease)   . H/O hiatal hernia   . GTXMIWOE(321.2)    "monthly" (05/25/2014)  . Heart attack ([place]) 2014   "mild"  . Hx of gastritis   . Hyperlipidemia   . Hypertension   . Irritable bowel syndrome (IBS)   . Migraine without aura, without mention of intractable migraine without mention of status migrainosus    "related to allergies; have them in the spring and fall" (05/25/2014)  . Obesity   . Pancreatic cyst    peripancreatic cystic lesion  . Renal cyst   . Sleep apnea    "wore mask; took it off in my sleep; quit wearing it" (05/25/2014)  . Sprain of neck  03/18/2013  . Uterine cancer ([place]) dx'd 2000   surg only  . Vasculitis ([place])    "irritates my legs"    Past Surgical History:  Procedure Laterality Date  . APPENDECTOMY  ~ 1967  . BREAST CYST EXCISION Right 1990  . LAPAROSCOPIC CHOLECYSTECTOMY  1990  . LEFT HEART CATHETERIZATION WITH CORONARY ANGIOGRAM N/A 05/26/2014   Procedure: LEFT HEART CATHETERIZATION WITH CORONARY ANGIOGRAM;  Surgeon: Troy Sine, MD;  Location: [facility] CATH LAB;  Service: Cardiovascular;  Laterality: N/A;  . SINUS SURGERY WITH INSTATRAK  2000  . TUBAL LIGATION  ~ 1982  . VAGINAL HYSTERECTOMY  2000    Current Medications: Outpatient Medications Prior to Visit  Medication Sig Dispense Refill  . acetaminophen (TYLENOL) 500 MG tablet Take 500 mg by mouth every 6 (six) hours as needed for moderate pain or headache.     . ALPRAZolam (XANAX) 0.5 MG tablet Take 0.5 mg by mouth at bedtime  as needed for anxiety.     Marland Kitchen atorvastatin (LIPITOR) 20 MG tablet Take 20 mg by mouth daily.    Marland Kitchen buPROPion (WELLBUTRIN XL) 300 MG 24 hr tablet Take 300 mg by mouth daily.    . Cholecalciferol (VITAMIN D3) 1000 UNITS CAPS Take 1 capsule by mouth at bedtime.     . Cyanocobalamin (B-12 PO) Take 1 tablet by mouth at bedtime.    . Dulaglutide (TRULICITY) 1.5 NI/6.2VO SOPN Trulicity 1.5 JJ/0.0 mL subcutaneous pen injector  USE WEEKLY    . FARXIGA 10 MG TABS tablet Take 1 tablet by mouth daily.    . fish oil-omega-3 fatty acids 1000 MG capsule Take 1 g by mouth at bedtime.     . insulin glargine (LANTUS) 100 UNIT/ML injection Inject 30 Units into the skin every morning.     . irbesartan (AVAPRO) 300 MG tablet Take 300 mg by mouth daily.     . metoprolol succinate (TOPROL-XL) 50 MG 24 hr tablet Take 100 mg by mouth 2 (two) times daily. Take with or immediately following a meal.     . MINIVELLE 0.025 MG/24HR Apply 1 patch topically 2 (two) times a week.    . pregabalin (LYRICA) 50 MG capsule Take 50 mg by mouth 2 (two) times daily.    . Pyridoxine HCl (VITAMIN B-6 PO) Take 1 tablet by mouth daily.    . RABEprazole (ACIPHEX) 20 MG tablet Take 1 tablet (20 mg total) by mouth 2 (two) times daily. 60 tablet 3  . tamsulosin (FLOMAX) 0.4 MG CAPS capsule Take 0.4 mg by mouth daily.  3  . traMADol (ULTRAM) 50 MG tablet Take 100 mg by mouth 2 (two) times daily.     Marland Kitchen venlafaxine XR (EFFEXOR-XR) 150 MG 24 hr capsule Take 150 mg by mouth 2 (two) times daily.    . zaleplon (SONATA) 5 MG capsule Take 5 mg by mouth at bedtime.     No facility-administered medications prior to visit.      Allergies:   Aspirin; Codeine; Naproxen; and Sulfonamide derivatives   Social History   Social History  . Marital status: Married    Spouse name: N/A  . Number of children: 2  . Years of education: N/A   Occupational History  . Retired   .  Retired   Social History Main Topics  . Smoking status: Never Smoker  .  Smokeless tobacco: Never Used  . Alcohol use No  . Drug use: No  . Sexual activity: No   Other Topics Concern  .  None   Social History Narrative   Daily Caffeine, Coke     Family History:  The patient's family history includes Breast cancer in her sister; Colon cancer in her maternal uncle; Colon polyps in her father; Diabetes in her maternal aunt; Heart disease in her father, maternal uncle, and unknown relative; Irritable bowel syndrome in her daughter; Ovarian cancer in her maternal aunt; Stomach cancer in her maternal aunt.   ROS General: Negative; No fevers, chills, or night sweats;  HEENT: Negative; No changes in vision or hearing, sinus congestion, difficulty swallowing Pulmonary: Negative; No cough, wheezing, shortness of breath, hemoptysis Cardiovascular: Negative; No chest pain, presyncope, syncope, palpitations GI: Negative; No nausea, vomiting, diarrhea, or abdominal pain GU: Negative; No dysuria, hematuria, or difficulty voiding Musculoskeletal: Negative; no myalgias, joint pain, or weakness Hematologic/Oncology: Negative; no easy bruising, bleeding Endocrine: Negative; no heat/cold intolerance; no diabetes Neuro: Negative; no changes in balance, headaches Skin: Negative; No rashes or skin lesions Psychiatric: Negative; No behavioral problems, depression Sleep: Positive OSA as above, no bruxism, restless legs, hypnogognic hallucinations, no cataplexy Other comprehensive 14 point system review is negative.   PHYSICAL EXAM:   VS:  BP 130/78   Pulse 74   Ht '5\' 5"'  (1.651 m)   Wt 183 lb 6.4 oz (83.2 kg)   BMI 30.52 kg/m     Wt Readings from Last 3 Encounters:  02/21/17 183 lb 6.4 oz (83.2 kg)  01/28/17 185 lb (83.9 kg)  12/24/16 184 lb (83.5 kg)    General: Alert, oriented, no distress.  Skin: normal turgor, no rashes, warm and dry HEENT: Normocephalic, atraumatic. Pupils equal round and reactive to light; sclera anicteric; extraocular muscles intact; Fundi No  hemorrhages or exudates, disc flat Nose without nasal septal hypertrophy Mouth/Parynx benign; Mallinpatti scale 3 Neck: No JVD, no carotid bruits; normal carotid upstroke Lungs: clear to ausculatation and percussion; no wheezing or rales Chest wall: without tenderness to palpitation Heart: PMI not displaced, RRR, s1 s2 normal, 1/6 systolic murmur, no diastolic murmur, no rubs, gallops, thrills, or heaves Abdomen: soft, nontender; no hepatosplenomehaly, BS+; abdominal aorta nontender and not dilated by palpation. Back: no CVA tenderness Pulses 2+ Musculoskeletal: full range of motion, normal strength, no joint deformities Extremities: no clubbing cyanosis or edema, Homan's sign negative  Neurologic: grossly nonfocal; Cranial nerves grossly wnl Psychologic: Normal mood and affect   Studies/Labs Reviewed:   EKG:  EKG is not ordered today.  Her ECG from 08/23/2016 was personally reviewed by me today, which reveals normal sinus rhythm at 71 bpm.  She has QS complex in V1 and V2 with poor anterior R-wave progression.  Recent Labs: BMP Latest Ref Rng & Units 09/06/2016 05/25/2014 05/25/2014  Glucose 70 - 99 mg/dL - - 382(H)  BUN 6 - 23 mg/dL - - 14  Creatinine 0.44 - 1.00 mg/dL 0.70 0.69 0.73  Sodium 137 - 147 mEq/L - - 137  Potassium 3.7 - 5.3 mEq/L - - 4.1  Chloride 96 - 112 mEq/L - - 95(L)  CO2 19 - 32 mEq/L - - 26  Calcium 8.4 - 10.5 mg/dL - - 9.4     Hepatic Function Latest Ref Rng & Units 09/02/2016 09/27/2010 08/24/2010  Total Protein 6.0 - 8.3 g/dL 7.3 6.9 7.0  Albumin 3.5 - 5.2 g/dL 4.0 3.8 3.8  AST 0 - 37 U/L 13 59(H) 52(H)  ALT 0 - 35 U/L 16 65(H) 68(H)  Alk Phosphatase 39 - 117 U/L 126(H) 221(H) 119(H)  Total Bilirubin 0.2 -  1.2 mg/dL 0.6 0.6 0.9  Bilirubin, Direct 0.0 - 0.3 mg/dL 0.1 0.1 -    CBC Latest Ref Rng & Units 05/25/2014 05/25/2014 02/22/2014  WBC 4.0 - 10.5 K/uL 6.3 8.0 9.2  Hemoglobin 12.0 - 15.0 g/dL 13.4 15.2(H) 14.5  Hematocrit 36.0 - 46.0 % 39.2 45.0 44.0   Platelets 150 - 400 K/uL 215 239 260.0   Lab Results  Component Value Date   MCV 86.2 05/25/2014   MCV 89.3 05/25/2014   MCV 89.0 02/22/2014   No results found for: TSH Lab Results  Component Value Date   HGBA1C 8.5 (H) 05/25/2014     BNP No results found for: BNP  ProBNP    Component Value Date/Time   PROBNP 67.3 05/25/2014 1300     Lipid Panel     Component Value Date/Time   CHOL 142 10/23/2016 1115   TRIG 109 10/23/2016 1115   HDL 52 10/23/2016 1115   CHOLHDL 2.7 10/23/2016 1115   VLDL 22 10/23/2016 1115   LDLCALC 68 10/23/2016 1115     RADIOLOGY: Dg Ribs Unilateral W/chest Left  Result Date: 01/28/2017 CLINICAL DATA:  Left rib pain after fall yesterday. EXAM: LEFT RIBS AND CHEST - 3+ VIEW COMPARISON:  None. FINDINGS: No fracture or other bone lesions are seen involving the ribs. There is no evidence of pneumothorax or pleural effusion. Both lungs are clear. Heart size and mediastinal contours are within normal limits. Atherosclerosis of thoracic aorta is noted. IMPRESSION: Normal left ribs. No acute cardiopulmonary abnormality seen. Aortic atherosclerosis. Electronically Signed   By: Marijo Conception, M.D.   On: 01/28/2017 17:15   Dg Wrist Complete Left  Result Date: 01/28/2017 CLINICAL DATA:  [place] yesterday in the parking lot at [employer], landing on LEFT side; pain in LEFT hand extending down into wrist especially dorsally EXAM: LEFT WRIST - COMPLETE 3+ VIEW COMPARISON:  None FINDINGS: Osseous demineralization. Joint spaces preserved. Questionable nondisplaced fracture at the base of the proximal phalanx LEFT little finger. Slight widening of the scapholunate interval raising question of scapholunate ligament tear. Additionally, a nondisplaced fracture is seen at the tip of the radial styloid. No dislocation or bone destruction. IMPRESSION: Nondisplaced fracture at tip of LEFT radial styloid. Questionable nondisplaced fracture at base of proximal phalanx LEFT little  finger. Question torn scapholunate ligament. Osseous demineralization. Electronically Signed   By: Lavonia Dana M.D.   On: 01/28/2017 13:32   Dg Hand Complete Left  Result Date: 01/28/2017 CLINICAL DATA:  [place] yesterday in the parking mild at [employer], landing on LEFT side; swelling at fourth and fifth fingers, pain traveling down to LEFT wrist mostly posteriorly EXAM: LEFT HAND - COMPLETE 3+ VIEW COMPARISON:  10/24/2015 FINDINGS: Diffuse osseous demineralization. Joint spaces preserved. Questionable fracture at base of proximal phalanx LEFT little finger on oblique view, not identified on remaining views, could potentially be extending intra-articular at the fifth MCP joint. Soft tissue swelling overlying the MCP joints on the lateral view. No additional fracture, dislocation or bone destruction. IMPRESSION: Questionable nondisplaced intra-articular fracture at the base of the proximal phalanx LEFT little finger. Electronically Signed   By: Lavonia Dana M.D.   On: 01/28/2017 13:27     Additional studies/ records that were reviewed today include:  I reviewed the records from Dr. Debara Pickett.  I reviewed her she'll sleep study and subsequent CPAP titration trials.  I have obtained a download and reviewed this with her in detail.    ASSESSMENT:    1.  OSA (obstructive sleep apnea)   2. Essential hypertension   3. Coronary artery calcification seen on computed tomography   4. Type 2 diabetes mellitus without complication, without long-term current use of insulin [facility])      PLAN:  Ms. Lukach is a 66 year old female who has history of coronary vascular comorbidities including hypertension, diabetes mellitus,  CAD noted on CT imaging, and prior vasculitis.  She has been documented have moderate sleep apnea.  Overall and had significant oxygen desaturation to 77% on her diagnostic polysomnogram.  She has been on CPAP therapy since 02/07/2017 set up date.  A download obtained over the past 12 days.  Reveals  that she is meeting Medicare compliance standards with reference to usage stays at 100% and usage greater than 4 hours at 75%.  However, she has only been using treatment for 4 hours and 52 minutes and I recommended to her that she needs to use CPAP and sleep for longer duration.  Typically she has never been one to sleep 7-8 hours per night, but I encouraged that this is optimal sleep duration in an adult.  Her AHI on therapy is excellent at 1.4 per hour.  She brought her full face mask into the office today.  We did obtain perform mask adjustment.  She has an appointment to see Dr. Debara Pickett, her primary cardiologist in August.  Since she presented to the office today and I do not have download data for 30 days.  She still requires a face-to-face evaluation for objective documentation of meeting 30 day compliance standards.  This will be done by Dr. Debara Pickett when he sees her for reevaluation prior to the 90 day Medicare window.  We will obtain a new download prior to his evaluation and he will need to documented his note .  Following the face-to-face evaluation that she is meeting compliance.  I answered all her questions regarding the Pap.  We discussed adverse consequences of untreated sleep apnea particularly with reference to cardiovascular health.  As long as she is remaining stable, I will see her in one year for follow-up sleep evaluation.   Medication Adjustments/Labs and Tests Ordered: Current medicines are reviewed at length with the patient today.  Concerns regarding medicines are outlined above.  Medication changes, Labs and Tests ordered today are listed in the Patient Instructions below. Patient Instructions  Your physician recommends that you keep appointment with D Hilty. Be sure that he notes CPAP compliance. See Dr Claiborne Billings in 1 year for sleep.     Signed, Shelva Majestic, MD  02/23/2017 2:13 PM    [facility] [address], [place], [place], [place]  [address] Phone:  [phone]

## 2017-03-06 ENCOUNTER — Ambulatory Visit (INDEPENDENT_AMBULATORY_CARE_PROVIDER_SITE_OTHER): Payer: Medicare Other | Admitting: Adult Health

## 2017-03-06 ENCOUNTER — Encounter: Payer: Self-pay | Admitting: Adult Health

## 2017-03-06 VITALS — Ht 65.0 in | Wt 189.2 lb

## 2017-03-06 DIAGNOSIS — I2510 Atherosclerotic heart disease of native coronary artery without angina pectoris: Secondary | ICD-10-CM | POA: Diagnosis not present

## 2017-03-06 DIAGNOSIS — R51 Headache: Secondary | ICD-10-CM

## 2017-03-06 DIAGNOSIS — R4789 Other speech disturbances: Secondary | ICD-10-CM

## 2017-03-06 DIAGNOSIS — G4486 Cervicogenic headache: Secondary | ICD-10-CM

## 2017-03-06 NOTE — Progress Notes (Signed)
I have read the note, and I agree with the clinical assessment and plan.  Malaina Mortellaro KEITH   

## 2017-03-06 NOTE — Patient Instructions (Signed)
Your Plan:  Memory score is stable We will continue to monitor symptoms If your symptoms worsen or you develop new symptoms please let us know.   Thank you for coming to see [place] at [facility]. I hope we have been able to provide you high quality care today.  You may receive a patient satisfaction survey over the next few weeks. We would appreciate your feedback and comments so that we may continue to improve ourselves and the health of our patients.

## 2017-03-06 NOTE — Progress Notes (Signed)
PATIENT: Alexis [name] DOB: [birth year]  REASON FOR VISIT: follow up- cervicogenic headache, word finding difficulty HISTORY FROM: patient  HISTORY OF PRESENT ILLNESS: Today 03/06/17 Alexis [name] is a 66 year old female with a history of cervicogenic headache and word finding difficulty. She returns today for follow-up. She states in regards to word finding that has remained the same. She states that this has been ongoing for several years. She does not feel that it is slightly worse. She states that she primarily notices it when she is around family members. She states that she knows what she wants to say but is unable to get the word out. There are other times that she knows the word but is having trouble pronouncing. She does notice some changes with her memory such as forgetting names and conversations. She is able to complete all ADLs independently. She states that her neck pain has been under relatively good control. She states as long as she uses a special pillow at night this seems to control it. She states in the past she has received a cortisone injection with orthopedist and that also offered her some benefit. She states that she was recently diagnosed with obstructive sleep apnea. She states that she was waking up with headaches. She just recently was started on CPAP therapy. She states for the last month she has been having a daily headache but she contributes this to sleep apnea. She states that she recently had a fall and fractured bones in the wrist and hand on the left side. She returns today for follow-up.   HISTORY 10/20/16: Alexis [name] is a 67 year old right-handed white female with a history of diabetes. The patient was seen in 2014 for what was felt to be cervicogenic headache. She has had headaches off and on, the headaches have been daily since the beginning of January 2018. The patient however has been having other problems that include difficulty with word finding that  began in early 2017. These issues have gradually worsened over time. The patient may have difficulty completing a sentence as she cannot find the right word for something. She denies any significant issues with general memory, she has no problems with driving or getting lost. The patient has no problems with remembering recent events. She may misplace things about the house on occasion. She denies any issues keeping up with her finances or keeping up with medications or appointments. She continues to have some neck discomfort and occasional dizziness. She may have some right arm numbness and discomfort coming out of the neck. She denies any significant issues with balance or with falls, but she does have some mild gait instability. She has some numbness and burning in the feet that may be worse at nighttime. She denies any issues controlling the bowels or the bladder. The patient is sent to this office for an evaluation.  REVIEW OF SYSTEMS: Out of a complete 14 system review of symptoms, the patient complains only of the following symptoms, and all other reviewed systems are negative.  Choking, leg swelling, trouble swallowing, eye itching, heat intolerance, constipation diarrhea waking, daytime sleepiness, snoring, sleep talking, acting out dreams, joint pain, joint swelling, back pain, aching muscles, walking difficulty, neck pain, depression, nervous changes, weakness, speech difficulty, headache, memory loss  ALLERGIES: Allergies  Allergen Reactions  . Aspirin Other (See Comments)    Reaction: Vasculitis per MD  . Codeine Rash    REACTION: "makes her itch"  . Naproxen Nausea Only    Reaction:  Makes stomach upset/irritated.  . Sulfonamide Derivatives Rash    REACTION: itching    HOME MEDICATIONS: Outpatient Medications Prior to Visit  Medication Sig Dispense Refill  . acetaminophen (TYLENOL) 500 MG tablet Take 500 mg by mouth every 6 (six) hours as needed for moderate pain or headache.       . ALPRAZolam (XANAX) 0.5 MG tablet Take 0.5 mg by mouth at bedtime as needed for anxiety.     Marland Kitchen atorvastatin (LIPITOR) 20 MG tablet Take 20 mg by mouth daily.    Marland Kitchen buPROPion (WELLBUTRIN XL) 300 MG 24 hr tablet Take 300 mg by mouth daily.    . Cholecalciferol (VITAMIN D3) 1000 UNITS CAPS Take 1 capsule by mouth at bedtime.     . Cyanocobalamin (B-12 PO) Take 1 tablet by mouth at bedtime.    Marland Kitchen FARXIGA 10 MG TABS tablet Take 1 tablet by mouth daily.    . fish oil-omega-3 fatty acids 1000 MG capsule Take 1 g by mouth at bedtime.     . insulin glargine (LANTUS) 100 UNIT/ML injection Inject 30 Units into the skin every morning.     . irbesartan (AVAPRO) 300 MG tablet Take 300 mg by mouth daily.     . metoprolol succinate (TOPROL-XL) 50 MG 24 hr tablet Take 100 mg by mouth 2 (two) times daily. Take with or immediately following a meal.     . MINIVELLE 0.025 MG/24HR Apply 1 patch topically 2 (two) times a week.    . pregabalin (LYRICA) 50 MG capsule Take 50 mg by mouth 2 (two) times daily.    . Pyridoxine HCl (VITAMIN B-6 PO) Take 1 tablet by mouth daily.    . RABEprazole (ACIPHEX) 20 MG tablet Take 1 tablet (20 mg total) by mouth 2 (two) times daily. 60 tablet 3  . tamsulosin (FLOMAX) 0.4 MG CAPS capsule Take 0.4 mg by mouth daily.  3  . traMADol (ULTRAM) 50 MG tablet Take 100 mg by mouth 2 (two) times daily.     Marland Kitchen venlafaxine XR (EFFEXOR-XR) 150 MG 24 hr capsule Take 150 mg by mouth 2 (two) times daily.    . zaleplon (SONATA) 5 MG capsule Take 5 mg by mouth at bedtime.    . Dulaglutide (TRULICITY) 1.5 HA/1.9FX SOPN Trulicity 1.5 TK/2.4 mL subcutaneous pen injector  USE WEEKLY     No facility-administered medications prior to visit.     PAST MEDICAL HISTORY: Past Medical History:  Diagnosis Date  . Anxiety   . Arthritis    "knees" (05/25/2014)  . Bursitis of knee    "both"  . Chronic back pain   . Depression   . Diabetes mellitus type II   . Diverticulitis   . Fatty liver   .  Fibromyalgia   . Gastric polyp    hyperplastic  . Gastroparesis    "recently dx'd" (05/25/2014)  . GERD (gastroesophageal reflux disease)   . H/O hiatal hernia   . OXBDZHGD(924.2)    "monthly" (05/25/2014)  . Heart attack ([place]) 2014   "mild"  . Hx of gastritis   . Hyperlipidemia   . Hypertension   . Irritable bowel syndrome (IBS)   . Migraine without aura, without mention of intractable migraine without mention of status migrainosus    "related to allergies; have them in the spring and fall" (05/25/2014)  . Obesity   . Pancreatic cyst    peripancreatic cystic lesion  . Renal cyst   . Sleep apnea    "wore mask;  took it off in my sleep; quit wearing it" (05/25/2014)  . Sprain of neck 03/18/2013  . Uterine cancer ([place]) dx'd 2000   surg only  . Vasculitis ([place])    "irritates my legs"    PAST SURGICAL HISTORY: Past Surgical History:  Procedure Laterality Date  . APPENDECTOMY  ~ 1967  . BREAST CYST EXCISION Right 1990  . LAPAROSCOPIC CHOLECYSTECTOMY  1990  . LEFT HEART CATHETERIZATION WITH CORONARY ANGIOGRAM N/A 05/26/2014   Procedure: LEFT HEART CATHETERIZATION WITH CORONARY ANGIOGRAM;  Surgeon: Troy Sine, MD;  Location: [facility] CATH LAB;  Service: Cardiovascular;  Laterality: N/A;  . SINUS SURGERY WITH INSTATRAK  2000  . TUBAL LIGATION  ~ 1982  . VAGINAL HYSTERECTOMY  2000    FAMILY HISTORY: Family History  Problem Relation Age of Onset  . Breast cancer Sister   . Colon polyps Father   . Heart disease Father   . Colon cancer Maternal Uncle   . Ovarian cancer Maternal Aunt   . Stomach cancer Maternal Aunt   . Diabetes Maternal Aunt   . Heart disease Maternal Uncle   . Heart disease Unknown        Grandparents  . Irritable bowel syndrome Daughter     SOCIAL HISTORY: Social History   Social History  . Marital status: Married    Spouse name: N/A  . Number of children: 2  . Years of education: N/A   Occupational History  . Retired   .  Retired   Social  History Main Topics  . Smoking status: Never Smoker  . Smokeless tobacco: Never Used  . Alcohol use No  . Drug use: No  . Sexual activity: No   Other Topics Concern  . Not on file   Social History Narrative   Daily Caffeine, Coke      PHYSICAL EXAM  Vitals:   03/06/17 1408  Weight: 189 lb 3.2 oz (85.8 kg)  Height: 5\' 5"  (1.651 m)   Body mass index is 31.48 kg/m.  Generalized: Well developed, in no acute distress   Neurological examination  Mentation: Alert oriented to time, place, history taking. Follows all commands speech and language fluent. MMSE 29/30 Cranial nerve II-XII: Pupils were equal round reactive to light. Extraocular movements were full, visual field were full on confrontational test. Facial sensation and strength were normal. Uvula tongue midline. Head turning and shoulder shrug  were normal and symmetric. Motor: The motor testing reveals 5 over 5 strength of all 4 extremities. Good symmetric motor tone is noted throughout.  Sensory: Sensory testing is intact to soft touch on all 4 extremities. No evidence of extinction is noted.  Coordination: Cerebellar testing reveals good finger-nose-finger and heel-to-shin bilaterally.  Gait and station: Gait is normal. Reflexes: Deep tendon reflexes are symmetric and normal bilaterally.   DIAGNOSTIC DATA (LABS, IMAGING, TESTING) - I reviewed patient records, labs, notes, testing and imaging myself where available.  Lab Results  Component Value Date   WBC 6.3 05/25/2014   HGB 13.4 05/25/2014   HCT 39.2 05/25/2014   MCV 86.2 05/25/2014   PLT 215 05/25/2014      Component Value Date/Time   NA 137 05/25/2014 1300   K 4.1 05/25/2014 1300   CL 95 (L) 05/25/2014 1300   CO2 26 05/25/2014 1300   GLUCOSE 382 (H) 05/25/2014 1300   BUN 14 05/25/2014 1300   CREATININE 0.70 09/06/2016 0916   CALCIUM 9.4 05/25/2014 1300   PROT 7.3 09/02/2016 0919   ALBUMIN  4.0 09/02/2016 0919   AST 13 09/02/2016 0919   ALT 16  09/02/2016 0919   ALKPHOS 126 (H) 09/02/2016 0919   BILITOT 0.6 09/02/2016 0919   GFRNONAA >90 05/25/2014 2130   GFRAA >90 05/25/2014 2130   Lab Results  Component Value Date   CHOL 142 10/23/2016   HDL 52 10/23/2016   LDLCALC 68 10/23/2016   TRIG 109 10/23/2016   CHOLHDL 2.7 10/23/2016   Lab Results  Component Value Date   HGBA1C 8.5 (H) 05/25/2014   Lab Results  Component Value Date   VITAMINB12 1,648 (H) 09/05/2016   No results found for: TSH    ASSESSMENT AND PLAN 66 y.o. year old female  has a past medical history of Anxiety; Arthritis; Bursitis of knee; Chronic back pain; Depression; Diabetes mellitus type II; Diverticulitis; Fatty liver; Fibromyalgia; Gastric polyp; Gastroparesis; GERD (gastroesophageal reflux disease); H/O hiatal hernia; Headache(784.0); Heart attack ([place]) (2014); gastritis; Hyperlipidemia; Hypertension; Irritable bowel syndrome (IBS); Migraine without aura, without mention of intractable migraine without mention of status migrainosus; Obesity; Pancreatic cyst; Renal cyst; Sleep apnea; Sprain of neck (03/18/2013); Uterine cancer ([place]) (dx'd 2000); and Vasculitis ([place]). here with:  1. Cervicogenic headache 2. Work finding difficulty  The patient reports that she is having daily headaches however she contributes this to sleep apnea. She denies any significant neck pain at this time. Her memory score remains relatively stable. She is advised that if her symptoms worsen or she develops new symptoms she should let us know. She will follow-up in 6 months or sooner if needed.     Ward Givens, MSN, NP-C 03/06/2017, 2:36 PM [facility] [address] [place],  [address] [phone]

## 2017-03-07 DIAGNOSIS — S62647D Nondisplaced fracture of proximal phalanx of left little finger, subsequent encounter for fracture with routine healing: Secondary | ICD-10-CM | POA: Diagnosis not present

## 2017-03-07 DIAGNOSIS — S52515D Nondisplaced fracture of left radial styloid process, subsequent encounter for closed fracture with routine healing: Secondary | ICD-10-CM | POA: Diagnosis not present

## 2017-03-12 DIAGNOSIS — S52515D Nondisplaced fracture of left radial styloid process, subsequent encounter for closed fracture with routine healing: Secondary | ICD-10-CM | POA: Diagnosis not present

## 2017-03-12 DIAGNOSIS — S62647D Nondisplaced fracture of proximal phalanx of left little finger, subsequent encounter for fracture with routine healing: Secondary | ICD-10-CM | POA: Diagnosis not present

## 2017-03-14 DIAGNOSIS — S62647D Nondisplaced fracture of proximal phalanx of left little finger, subsequent encounter for fracture with routine healing: Secondary | ICD-10-CM | POA: Diagnosis not present

## 2017-03-14 DIAGNOSIS — S52515D Nondisplaced fracture of left radial styloid process, subsequent encounter for closed fracture with routine healing: Secondary | ICD-10-CM | POA: Diagnosis not present

## 2017-03-18 DIAGNOSIS — S62647D Nondisplaced fracture of proximal phalanx of left little finger, subsequent encounter for fracture with routine healing: Secondary | ICD-10-CM | POA: Diagnosis not present

## 2017-03-18 DIAGNOSIS — S52515D Nondisplaced fracture of left radial styloid process, subsequent encounter for closed fracture with routine healing: Secondary | ICD-10-CM | POA: Diagnosis not present

## 2017-03-21 DIAGNOSIS — S62647D Nondisplaced fracture of proximal phalanx of left little finger, subsequent encounter for fracture with routine healing: Secondary | ICD-10-CM | POA: Diagnosis not present

## 2017-03-21 DIAGNOSIS — S52515D Nondisplaced fracture of left radial styloid process, subsequent encounter for closed fracture with routine healing: Secondary | ICD-10-CM | POA: Diagnosis not present

## 2017-03-24 DIAGNOSIS — S52515D Nondisplaced fracture of left radial styloid process, subsequent encounter for closed fracture with routine healing: Secondary | ICD-10-CM | POA: Diagnosis not present

## 2017-03-24 DIAGNOSIS — S62647D Nondisplaced fracture of proximal phalanx of left little finger, subsequent encounter for fracture with routine healing: Secondary | ICD-10-CM | POA: Diagnosis not present

## 2017-03-26 DIAGNOSIS — S62647D Nondisplaced fracture of proximal phalanx of left little finger, subsequent encounter for fracture with routine healing: Secondary | ICD-10-CM | POA: Diagnosis not present

## 2017-03-26 DIAGNOSIS — S52515D Nondisplaced fracture of left radial styloid process, subsequent encounter for closed fracture with routine healing: Secondary | ICD-10-CM | POA: Diagnosis not present

## 2017-03-31 DIAGNOSIS — S62647D Nondisplaced fracture of proximal phalanx of left little finger, subsequent encounter for fracture with routine healing: Secondary | ICD-10-CM | POA: Diagnosis not present

## 2017-03-31 DIAGNOSIS — S52515D Nondisplaced fracture of left radial styloid process, subsequent encounter for closed fracture with routine healing: Secondary | ICD-10-CM | POA: Diagnosis not present

## 2017-04-03 DIAGNOSIS — S52515D Nondisplaced fracture of left radial styloid process, subsequent encounter for closed fracture with routine healing: Secondary | ICD-10-CM | POA: Diagnosis not present

## 2017-04-03 DIAGNOSIS — S62647D Nondisplaced fracture of proximal phalanx of left little finger, subsequent encounter for fracture with routine healing: Secondary | ICD-10-CM | POA: Diagnosis not present

## 2017-04-07 DIAGNOSIS — S52515D Nondisplaced fracture of left radial styloid process, subsequent encounter for closed fracture with routine healing: Secondary | ICD-10-CM | POA: Diagnosis not present

## 2017-04-07 DIAGNOSIS — S62647D Nondisplaced fracture of proximal phalanx of left little finger, subsequent encounter for fracture with routine healing: Secondary | ICD-10-CM | POA: Diagnosis not present

## 2017-04-08 ENCOUNTER — Ambulatory Visit (INDEPENDENT_AMBULATORY_CARE_PROVIDER_SITE_OTHER): Payer: Medicare Other | Admitting: Internal Medicine

## 2017-04-08 ENCOUNTER — Encounter: Payer: Self-pay | Admitting: Internal Medicine

## 2017-04-08 VITALS — BP 168/92 | HR 64 | Ht 65.0 in | Wt 189.8 lb

## 2017-04-08 DIAGNOSIS — I10 Essential (primary) hypertension: Secondary | ICD-10-CM

## 2017-04-08 DIAGNOSIS — I2510 Atherosclerotic heart disease of native coronary artery without angina pectoris: Secondary | ICD-10-CM

## 2017-04-08 DIAGNOSIS — G4733 Obstructive sleep apnea (adult) (pediatric): Secondary | ICD-10-CM

## 2017-04-08 DIAGNOSIS — E785 Hyperlipidemia, unspecified: Secondary | ICD-10-CM

## 2017-04-08 NOTE — Patient Instructions (Signed)
Your physician wants you to follow-up in: ONE YEAR with Dr. Hilty. You will receive a reminder letter in the mail two months in advance. If you don't receive a letter, please call our office to schedule the follow-up appointment.  

## 2017-04-09 DIAGNOSIS — I2510 Atherosclerotic heart disease of native coronary artery without angina pectoris: Secondary | ICD-10-CM | POA: Insufficient documentation
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
## 2017-04-10 DIAGNOSIS — I10 Essential (primary) hypertension: Secondary | ICD-10-CM | POA: Diagnosis not present

## 2017-04-10 DIAGNOSIS — S52515D Nondisplaced fracture of left radial styloid process, subsequent encounter for closed fracture with routine healing: Secondary | ICD-10-CM | POA: Diagnosis not present

## 2017-04-10 DIAGNOSIS — E118 Type 2 diabetes mellitus with unspecified complications: Secondary | ICD-10-CM | POA: Diagnosis not present

## 2017-04-10 DIAGNOSIS — S62647D Nondisplaced fracture of proximal phalanx of left little finger, subsequent encounter for fracture with routine healing: Secondary | ICD-10-CM | POA: Diagnosis not present

## 2017-04-17 DIAGNOSIS — S52515D Nondisplaced fracture of left radial styloid process, subsequent encounter for closed fracture with routine healing: Secondary | ICD-10-CM | POA: Diagnosis not present

## 2017-04-17 DIAGNOSIS — E118 Type 2 diabetes mellitus with unspecified complications: Secondary | ICD-10-CM | POA: Diagnosis not present

## 2017-04-17 DIAGNOSIS — S62647D Nondisplaced fracture of proximal phalanx of left little finger, subsequent encounter for fracture with routine healing: Secondary | ICD-10-CM | POA: Diagnosis not present

## 2017-04-17 DIAGNOSIS — I10 Essential (primary) hypertension: Secondary | ICD-10-CM | POA: Diagnosis not present

## 2017-04-21 DIAGNOSIS — S62647D Nondisplaced fracture of proximal phalanx of left little finger, subsequent encounter for fracture with routine healing: Secondary | ICD-10-CM | POA: Diagnosis not present

## 2017-04-21 DIAGNOSIS — M25532 Pain in left wrist: Secondary | ICD-10-CM | POA: Diagnosis not present

## 2017-04-21 DIAGNOSIS — S52515D Nondisplaced fracture of left radial styloid process, subsequent encounter for closed fracture with routine healing: Secondary | ICD-10-CM | POA: Diagnosis not present

## 2017-04-24 DIAGNOSIS — S52515D Nondisplaced fracture of left radial styloid process, subsequent encounter for closed fracture with routine healing: Secondary | ICD-10-CM | POA: Diagnosis not present

## 2017-04-24 DIAGNOSIS — S62647D Nondisplaced fracture of proximal phalanx of left little finger, subsequent encounter for fracture with routine healing: Secondary | ICD-10-CM | POA: Diagnosis not present

## 2017-04-28 DIAGNOSIS — S52515D Nondisplaced fracture of left radial styloid process, subsequent encounter for closed fracture with routine healing: Secondary | ICD-10-CM | POA: Diagnosis not present

## 2017-04-28 DIAGNOSIS — S62647D Nondisplaced fracture of proximal phalanx of left little finger, subsequent encounter for fracture with routine healing: Secondary | ICD-10-CM | POA: Diagnosis not present

## 2017-05-01 DIAGNOSIS — S52515D Nondisplaced fracture of left radial styloid process, subsequent encounter for closed fracture with routine healing: Secondary | ICD-10-CM | POA: Diagnosis not present

## 2017-05-01 DIAGNOSIS — S62647D Nondisplaced fracture of proximal phalanx of left little finger, subsequent encounter for fracture with routine healing: Secondary | ICD-10-CM | POA: Diagnosis not present

## 2017-05-12 DIAGNOSIS — S52515D Nondisplaced fracture of left radial styloid process, subsequent encounter for closed fracture with routine healing: Secondary | ICD-10-CM | POA: Diagnosis not present

## 2017-05-12 DIAGNOSIS — S62647D Nondisplaced fracture of proximal phalanx of left little finger, subsequent encounter for fracture with routine healing: Secondary | ICD-10-CM | POA: Diagnosis not present

## 2017-05-15 DIAGNOSIS — S52515D Nondisplaced fracture of left radial styloid process, subsequent encounter for closed fracture with routine healing: Secondary | ICD-10-CM | POA: Diagnosis not present

## 2017-05-15 DIAGNOSIS — S62647D Nondisplaced fracture of proximal phalanx of left little finger, subsequent encounter for fracture with routine healing: Secondary | ICD-10-CM | POA: Diagnosis not present

## 2017-05-19 DIAGNOSIS — S62647D Nondisplaced fracture of proximal phalanx of left little finger, subsequent encounter for fracture with routine healing: Secondary | ICD-10-CM | POA: Diagnosis not present

## 2017-05-19 DIAGNOSIS — S52515D Nondisplaced fracture of left radial styloid process, subsequent encounter for closed fracture with routine healing: Secondary | ICD-10-CM | POA: Diagnosis not present

## 2017-05-21 DIAGNOSIS — M25532 Pain in left wrist: Secondary | ICD-10-CM | POA: Diagnosis not present

## 2017-05-26 DIAGNOSIS — M5412 Radiculopathy, cervical region: Secondary | ICD-10-CM | POA: Diagnosis not present

## 2017-06-12 DIAGNOSIS — M5412 Radiculopathy, cervical region: Secondary | ICD-10-CM | POA: Diagnosis not present

## 2017-06-18 DIAGNOSIS — M5412 Radiculopathy, cervical region: Secondary | ICD-10-CM | POA: Diagnosis not present

## 2017-07-15 DIAGNOSIS — Z800 Family history of malignant neoplasm of digestive organs: Secondary | ICD-10-CM | POA: Diagnosis not present

## 2017-07-15 DIAGNOSIS — E7800 Pure hypercholesterolemia, unspecified: Secondary | ICD-10-CM | POA: Diagnosis not present

## 2017-07-15 DIAGNOSIS — E669 Obesity, unspecified: Secondary | ICD-10-CM | POA: Diagnosis not present

## 2017-07-15 DIAGNOSIS — I252 Old myocardial infarction: Secondary | ICD-10-CM | POA: Diagnosis not present

## 2017-07-15 DIAGNOSIS — Z803 Family history of malignant neoplasm of breast: Secondary | ICD-10-CM | POA: Diagnosis not present

## 2017-07-15 DIAGNOSIS — Z23 Encounter for immunization: Secondary | ICD-10-CM | POA: Diagnosis not present

## 2017-07-15 DIAGNOSIS — I10 Essential (primary) hypertension: Secondary | ICD-10-CM | POA: Diagnosis not present

## 2017-07-15 DIAGNOSIS — E789 Disorder of lipoprotein metabolism, unspecified: Secondary | ICD-10-CM | POA: Diagnosis not present

## 2017-07-15 DIAGNOSIS — F329 Major depressive disorder, single episode, unspecified: Secondary | ICD-10-CM | POA: Diagnosis not present

## 2017-07-15 DIAGNOSIS — E118 Type 2 diabetes mellitus with unspecified complications: Secondary | ICD-10-CM | POA: Diagnosis not present

## 2017-07-16 DIAGNOSIS — M5412 Radiculopathy, cervical region: Secondary | ICD-10-CM | POA: Diagnosis not present

## 2017-07-16 DIAGNOSIS — F329 Major depressive disorder, single episode, unspecified: Secondary | ICD-10-CM | POA: Diagnosis not present

## 2017-07-16 DIAGNOSIS — N390 Urinary tract infection, site not specified: Secondary | ICD-10-CM | POA: Diagnosis not present

## 2017-07-16 DIAGNOSIS — E118 Type 2 diabetes mellitus with unspecified complications: Secondary | ICD-10-CM | POA: Diagnosis not present

## 2017-07-16 DIAGNOSIS — E7800 Pure hypercholesterolemia, unspecified: Secondary | ICD-10-CM | POA: Diagnosis not present

## 2017-07-16 DIAGNOSIS — I10 Essential (primary) hypertension: Secondary | ICD-10-CM | POA: Diagnosis not present

## 2017-07-24 ENCOUNTER — Encounter: Payer: Self-pay | Admitting: Gastroenterology

## 2017-07-24 DIAGNOSIS — E119 Type 2 diabetes mellitus without complications: Secondary | ICD-10-CM | POA: Diagnosis not present

## 2017-07-24 DIAGNOSIS — E1140 Type 2 diabetes mellitus with diabetic neuropathy, unspecified: Secondary | ICD-10-CM | POA: Diagnosis not present

## 2017-08-22 ENCOUNTER — Telehealth: Payer: Self-pay | Admitting: Gastroenterology

## 2017-08-22 NOTE — Telephone Encounter (Signed)
Thanks for checking.  Her last colonoscopy was in 09/2010.  Complete exam with good preparation and no polyps removed.  No parents or siblings with colon cancer.  So she does not need her next colonoscopy until Feb 2022.  I do not know why the information on my note (which is added by the MA) says 2020 - I suspect it was a typo.  Patient does not need a screening colonoscopy at this time.  She therefore does not need an appointment with [facility] nurse.  Recall should be checked in system so that it is for Feb 2022.  - HD

## 2017-08-22 NOTE — Telephone Encounter (Signed)
Dr. Loletha Carrow,  We had received a referral to schedule patient a recall colon. She has pre visit scheduled for 09/02/17. Shirlean Mylar was looking through charts and noticed at last office visit you had said recall colon 2020. Should we cancel these appointments?   Thank You, Farrel Conners

## 2017-08-25 NOTE — Telephone Encounter (Signed)
Left message informing patient that she is not due for another colon until 09/2020 and that her appointments have been cancelled.

## 2017-08-26 ENCOUNTER — Encounter: Payer: Self-pay | Admitting: Internal Medicine

## 2017-09-16 ENCOUNTER — Encounter: Payer: Medicare Other | Admitting: Gastroenterology

## 2017-09-18 ENCOUNTER — Encounter: Payer: Self-pay | Admitting: Neurology

## 2017-09-18 ENCOUNTER — Ambulatory Visit (INDEPENDENT_AMBULATORY_CARE_PROVIDER_SITE_OTHER): Payer: Medicare Other | Admitting: Neurology

## 2017-09-18 VITALS — BP 180/79 | HR 64 | Ht 65.0 in | Wt 187.5 lb

## 2017-09-18 DIAGNOSIS — R413 Other amnesia: Secondary | ICD-10-CM | POA: Diagnosis not present

## 2017-09-18 DIAGNOSIS — R4789 Other speech disturbances: Secondary | ICD-10-CM | POA: Diagnosis not present

## 2017-09-18 HISTORY — DX: Other amnesia: R41.3

## 2017-09-18 NOTE — Progress Notes (Signed)
Reason for visit: Word finding problems  Alexis [name] is an 67 y.o. female  History of present illness:  Alexis [name] is a 67 year old right-handed white female with a history of diabetes and fibromyalgia and sleep apnea on CPAP.  The patient has had some problems with word finding problems over the last couple years that is continuing.  She is also now developing some problems with short-term memory.  She has some difficulty keeping up with appointments, she is having difficulty with cooking, she is less sure of her self in terms of remembering things.  She does have some fatigue during the day, she occasionally will take a nap.  She tries to keep her mind active by doing word puzzles.  She has not given up doing any activities of daily living because of the memory issues.  She still operates a Teacher, music, she does not have any significant problems with directions.  Past Medical History:  Diagnosis Date  . Anxiety   . Arthritis    "knees" (05/25/2014)  . Bursitis of knee    "both"  . Chronic back pain   . Depression   . Diabetes mellitus type II   . Diverticulitis   . Fatty liver   . Fibromyalgia   . Gastric polyp    hyperplastic  . Gastroparesis    "recently dx'd" (05/25/2014)  . GERD (gastroesophageal reflux disease)   . H/O hiatal hernia   . WJXBJYNW(295.6)    "monthly" (05/25/2014)  . Heart attack ([place]) 2014   "mild"  . Hx of gastritis   . Hyperlipidemia   . Hypertension   . Irritable bowel syndrome (IBS)   . Memory difficulties 09/18/2017  . Migraine without aura, without mention of intractable migraine without mention of status migrainosus    "related to allergies; have them in the spring and fall" (05/25/2014)  . Obesity   . Pancreatic cyst    peripancreatic cystic lesion  . Renal cyst   . Sleep apnea    "wore mask; took it off in my sleep; quit wearing it" (05/25/2014)  . Sprain of neck 03/18/2013  . Uterine cancer ([place]) dx'd 2000   surg only  .  Vasculitis ([place])    "irritates my legs"    Past Surgical History:  Procedure Laterality Date  . APPENDECTOMY  ~ 1967  . BREAST CYST EXCISION Right 1990  . LAPAROSCOPIC CHOLECYSTECTOMY  1990  . LEFT HEART CATHETERIZATION WITH CORONARY ANGIOGRAM N/A 05/26/2014   Procedure: LEFT HEART CATHETERIZATION WITH CORONARY ANGIOGRAM;  Surgeon: Troy Sine, MD;  Location: [facility] CATH LAB;  Service: Cardiovascular;  Laterality: N/A;  . SINUS SURGERY WITH INSTATRAK  2000  . TUBAL LIGATION  ~ 1982  . VAGINAL HYSTERECTOMY  2000    Family History  Problem Relation Age of Onset  . Breast cancer Sister   . Colon polyps Father   . Heart disease Father   . Colon cancer Maternal Uncle   . Ovarian cancer Maternal Aunt   . Stomach cancer Maternal Aunt   . Diabetes Maternal Aunt   . Heart disease Maternal Uncle   . Heart disease Unknown        Grandparents  . Irritable bowel syndrome Daughter     Social history:  reports that  has never smoked. she has never used smokeless tobacco. She reports that she does not drink alcohol or use drugs.    Allergies  Allergen Reactions  . Aspirin Other (See Comments)  Reaction: Vasculitis per MD  . Codeine Rash    REACTION: "makes her itch"  . Naproxen Nausea Only    Reaction: Makes stomach upset/irritated.  . Sulfonamide Derivatives Rash    REACTION: itching    Medications:  Prior to Admission medications   Medication Sig Start Date End Date Taking? Authorizing Provider  acetaminophen (TYLENOL) 500 MG tablet Take 500 mg by mouth every 6 (six) hours as needed for moderate pain or headache.    Yes [provider]  ALPRAZolam Duanne Moron) 0.5 MG tablet Take 0.5 mg by mouth at bedtime as needed for anxiety.    Yes [provider]  atorvastatin (LIPITOR) 20 MG tablet Take 20 mg by mouth daily.   Yes [provider]  buPROPion (WELLBUTRIN XL) 300 MG 24 hr tablet Take 300 mg by mouth daily.   Yes [provider]  Cholecalciferol  (VITAMIN D3) 1000 UNITS CAPS Take 1 capsule by mouth at bedtime.    Yes [provider]  Cyanocobalamin (B-12 PO) Take 1 tablet by mouth at bedtime.   Yes [provider]  FARXIGA 10 MG TABS tablet Take 1 tablet by mouth daily. 10/09/16  Yes [provider]  fish oil-omega-3 fatty acids 1000 MG capsule Take 1 g by mouth at bedtime.    Yes [provider]  insulin glargine (LANTUS) 100 UNIT/ML injection Inject 30 Units into the skin every morning.    Yes [provider]  irbesartan (AVAPRO) 300 MG tablet Take 300 mg by mouth daily.    Yes [provider]  metoprolol succinate (TOPROL-XL) 50 MG 24 hr tablet Take 100 mg by mouth 2 (two) times daily. Take with or immediately following a meal.    Yes [provider]  MINIVELLE 0.025 MG/24HR Apply 1 patch topically 2 (two) times a week. 08/12/16  Yes [provider]  pregabalin (LYRICA) 50 MG capsule Take 50 mg by mouth 2 (two) times daily.   Yes [provider]  Pyridoxine HCl (VITAMIN B-6 PO) Take 1 tablet by mouth daily.   Yes [provider]  RABEprazole (ACIPHEX) 20 MG tablet Take 1 tablet (20 mg total) by mouth 2 (two) times daily. 04/22/12  Yes Lafayette Dragon, MD  tamsulosin (FLOMAX) 0.4 MG CAPS capsule Take 0.4 mg by mouth daily. 07/17/16  Yes [provider]  traMADol (ULTRAM) 50 MG tablet Take 100 mg by mouth 2 (two) times daily.    Yes [provider]  venlafaxine XR (EFFEXOR-XR) 150 MG 24 hr capsule Take 150 mg by mouth 2 (two) times daily.   Yes [provider]  zaleplon (SONATA) 5 MG capsule Take 5 mg by mouth at bedtime.   Yes [provider]    ROS:  Out of a complete 14 system review of symptoms, the patient complains only of the following symptoms, and all other reviewed systems are negative.  Excessive thirst Abdominal pain, irritable bowel syndrome Occasional choking Leg swelling Restless legs, sleep apnea,  frequent waking, daytime sleepiness, snoring, sleep talking Urinary urgency and frequency Joint pain, back pain, muscle cramps, walking difficulty Skin rash Memory loss, dizziness, headache, speech difficulty, weakness Depression  Blood pressure (!) 180/79, pulse 64, height 5\' 5"  (1.651 m), weight 187 lb 8 oz (85 kg).  Physical Exam  General: The patient is alert and cooperative at the time of the examination.  The patient is moderately to markedly obese.  Skin: No significant peripheral edema is noted.   Neurologic Exam  Mental status: The patient is alert and oriented x 3 at the time of the examination. The patient has apparent normal recent and remote memory, with an apparently normal attention span and concentration ability.  Mini-Mental status examination done today shows a total score of 30/30.   Cranial nerves: Facial symmetry is present. Speech is normal, no aphasia or dysarthria is noted. Extraocular movements are full. Visual fields are full.  Motor: The patient has good strength in all 4 extremities.  Sensory examination: Soft touch sensation is symmetric on the face, arms, and legs.  Coordination: The patient has good finger-nose-finger and heel-to-shin bilaterally.  Gait and station: The patient has a normal gait. Tandem gait is slightly unsteady. Romberg is negative. No drift is seen.  Reflexes: Deep tendon reflexes are symmetric.   MRI brain 10/17/16:  IMPRESSION: This MRI of the brain without contrast shows the following: 1. A few scattered T2/FLAIR hyperintense foci in the pons and hemispheres consistent with mild chronic microvascular ischemic change. 2. Brain volume is normal for age. 3. There are no acute findings.  * MRI scan images were reviewed online. I agree with the written report.   Assessment/Plan:  1.  Word finding problems, short-term memory issues  The patient is having some word finding problems that are probably related to an  underlying memory disorder.  The patient has had MRI study of the brain that showed minimal white matter changes.  She has had some neck pain that responded quite well to an epidural steroid injection, she is not having any neck pain at this time but she does report some low back pain.  She was seen previously by Dr. Mina Marble, she may return to him for treatment of her back as well.  The memory issues will be followed over time, the patient does not wish to start a medication such as Aricept for the memory currently.  She will follow-up in about 8 months.  Jill Alexanders MD 09/18/2017 3:56 PM  [facility] [address] [place], [place] [address]  Phone [phone] Fax [phone]

## 2017-09-25 DIAGNOSIS — E118 Type 2 diabetes mellitus with unspecified complications: Secondary | ICD-10-CM | POA: Diagnosis not present

## 2017-09-25 DIAGNOSIS — E7800 Pure hypercholesterolemia, unspecified: Secondary | ICD-10-CM | POA: Diagnosis not present

## 2017-09-29 DIAGNOSIS — E118 Type 2 diabetes mellitus with unspecified complications: Secondary | ICD-10-CM | POA: Diagnosis not present

## 2017-09-29 DIAGNOSIS — I252 Old myocardial infarction: Secondary | ICD-10-CM | POA: Diagnosis not present

## 2017-09-29 DIAGNOSIS — E7800 Pure hypercholesterolemia, unspecified: Secondary | ICD-10-CM | POA: Diagnosis not present

## 2017-09-29 DIAGNOSIS — I10 Essential (primary) hypertension: Secondary | ICD-10-CM | POA: Diagnosis not present

## 2017-10-16 DIAGNOSIS — Z780 Asymptomatic menopausal state: Secondary | ICD-10-CM | POA: Diagnosis not present

## 2017-10-16 DIAGNOSIS — Z79890 Hormone replacement therapy (postmenopausal): Secondary | ICD-10-CM | POA: Diagnosis not present

## 2017-10-16 DIAGNOSIS — Z01419 Encounter for gynecological examination (general) (routine) without abnormal findings: Secondary | ICD-10-CM | POA: Diagnosis not present

## 2017-10-16 DIAGNOSIS — Z1231 Encounter for screening mammogram for malignant neoplasm of breast: Secondary | ICD-10-CM | POA: Diagnosis not present

## 2018-01-26 DIAGNOSIS — E7800 Pure hypercholesterolemia, unspecified: Secondary | ICD-10-CM | POA: Diagnosis not present

## 2018-02-02 DIAGNOSIS — M25569 Pain in unspecified knee: Secondary | ICD-10-CM | POA: Diagnosis not present

## 2018-02-02 DIAGNOSIS — E118 Type 2 diabetes mellitus with unspecified complications: Secondary | ICD-10-CM | POA: Diagnosis not present

## 2018-02-02 DIAGNOSIS — I776 Arteritis, unspecified: Secondary | ICD-10-CM | POA: Diagnosis not present

## 2018-02-02 DIAGNOSIS — L409 Psoriasis, unspecified: Secondary | ICD-10-CM | POA: Diagnosis not present

## 2018-02-02 DIAGNOSIS — M545 Low back pain: Secondary | ICD-10-CM | POA: Diagnosis not present

## 2018-02-02 DIAGNOSIS — M79643 Pain in unspecified hand: Secondary | ICD-10-CM | POA: Diagnosis not present

## 2018-02-02 DIAGNOSIS — M1711 Unilateral primary osteoarthritis, right knee: Secondary | ICD-10-CM | POA: Diagnosis not present

## 2018-02-02 DIAGNOSIS — M1712 Unilateral primary osteoarthritis, left knee: Secondary | ICD-10-CM | POA: Diagnosis not present

## 2018-02-02 DIAGNOSIS — M1990 Unspecified osteoarthritis, unspecified site: Secondary | ICD-10-CM | POA: Diagnosis not present

## 2018-02-05 DIAGNOSIS — K219 Gastro-esophageal reflux disease without esophagitis: Secondary | ICD-10-CM | POA: Diagnosis not present

## 2018-02-05 DIAGNOSIS — F329 Major depressive disorder, single episode, unspecified: Secondary | ICD-10-CM | POA: Diagnosis not present

## 2018-02-05 DIAGNOSIS — E7800 Pure hypercholesterolemia, unspecified: Secondary | ICD-10-CM | POA: Diagnosis not present

## 2018-02-05 DIAGNOSIS — I10 Essential (primary) hypertension: Secondary | ICD-10-CM | POA: Diagnosis not present

## 2018-02-05 DIAGNOSIS — E118 Type 2 diabetes mellitus with unspecified complications: Secondary | ICD-10-CM | POA: Diagnosis not present

## 2018-02-05 DIAGNOSIS — E669 Obesity, unspecified: Secondary | ICD-10-CM | POA: Diagnosis not present

## 2018-02-09 DIAGNOSIS — M542 Cervicalgia: Secondary | ICD-10-CM | POA: Diagnosis not present

## 2018-02-09 DIAGNOSIS — M5416 Radiculopathy, lumbar region: Secondary | ICD-10-CM | POA: Diagnosis not present

## 2018-02-16 ENCOUNTER — Emergency Department (HOSPITAL_COMMUNITY)
Admission: EM | Admit: 2018-02-16 | Discharge: 2018-02-16 | Discharge status: Against Medical Advice | Payer: Medicare Other

## 2018-02-18 ENCOUNTER — Encounter: Payer: Self-pay | Admitting: Podiatry

## 2018-02-18 ENCOUNTER — Ambulatory Visit (INDEPENDENT_AMBULATORY_CARE_PROVIDER_SITE_OTHER): Payer: Medicare Other | Admitting: Podiatry

## 2018-02-18 ENCOUNTER — Other Ambulatory Visit: Payer: Self-pay | Admitting: Podiatry

## 2018-02-18 ENCOUNTER — Ambulatory Visit (INDEPENDENT_AMBULATORY_CARE_PROVIDER_SITE_OTHER): Payer: Medicare Other

## 2018-02-18 VITALS — BP 120/67 | HR 65 | Resp 16

## 2018-02-18 DIAGNOSIS — M79674 Pain in right toe(s): Secondary | ICD-10-CM | POA: Diagnosis not present

## 2018-02-18 DIAGNOSIS — N3000 Acute cystitis without hematuria: Secondary | ICD-10-CM | POA: Diagnosis not present

## 2018-02-18 DIAGNOSIS — B351 Tinea unguium: Secondary | ICD-10-CM | POA: Diagnosis not present

## 2018-02-18 DIAGNOSIS — M79671 Pain in right foot: Secondary | ICD-10-CM

## 2018-02-18 DIAGNOSIS — M2010 Hallux valgus (acquired), unspecified foot: Secondary | ICD-10-CM

## 2018-02-18 DIAGNOSIS — M79672 Pain in left foot: Secondary | ICD-10-CM

## 2018-02-18 DIAGNOSIS — M21619 Bunion of unspecified foot: Secondary | ICD-10-CM | POA: Diagnosis not present

## 2018-02-18 DIAGNOSIS — M79675 Pain in left toe(s): Secondary | ICD-10-CM

## 2018-02-18 DIAGNOSIS — E119 Type 2 diabetes mellitus without complications: Secondary | ICD-10-CM | POA: Insufficient documentation

## 2018-02-18 DIAGNOSIS — M2011 Hallux valgus (acquired), right foot: Secondary | ICD-10-CM | POA: Diagnosis not present

## 2018-02-18 DIAGNOSIS — M2012 Hallux valgus (acquired), left foot: Secondary | ICD-10-CM

## 2018-02-18 DIAGNOSIS — R3914 Feeling of incomplete bladder emptying: Secondary | ICD-10-CM | POA: Diagnosis not present

## 2018-02-18 DIAGNOSIS — N3946 Mixed incontinence: Secondary | ICD-10-CM | POA: Diagnosis not present

## 2018-02-19 NOTE — Progress Notes (Signed)
Subjective:   Patient ID: Alexis [name], female   DOB: 67 y.o.   MRN: [id number]   HPI Patient presents with overall foot pain bilateral structural bunion deformity right and nail disease 1-5 both feet that are thick yellow brittle and she cannot cut.  Her sugar is not in good control with last A1c being 9.0 and patient does not smoke currently and likes to be active   Review of Systems  All other systems reviewed and are negative.       Objective:  Physical Exam  Constitutional: She appears well-developed and well-nourished.  Cardiovascular: Intact distal pulses.  Pulmonary/Chest: Effort normal.  Musculoskeletal: Normal range of motion.  Neurological: She is alert.  Skin: Skin is warm.  Nursing note and vitals reviewed.   Neurovascular status found to be diminished with diminished sharp dull vibratory and weak and pulses bilateral with long-term history of diabetes with patient noted to have thick yellow brittle nailbeds 1-5 both feet that are moderately painful and mild instability with gait     Assessment:  Mycotic nail infection with pain 1-5 both feet with chronic issues secondary to foot structure and bunion deformity right with at risk neurological condition associated with her diabetes     Plan:  H&P diabetic education discussed with patient and today debridement of nailbeds accomplished 1-5 both feet with no iatrogenic bleeding.  I do not recommend treatment for other conditions except for her continued medication usage and I did advise her on regular routine care and daily inspections of her feet  X-rays indicated structural bunion with mild osteoporosis arthritis and no indication of stress fracture

## 2018-03-02 DIAGNOSIS — E7800 Pure hypercholesterolemia, unspecified: Secondary | ICD-10-CM | POA: Diagnosis not present

## 2018-03-02 DIAGNOSIS — I10 Essential (primary) hypertension: Secondary | ICD-10-CM | POA: Diagnosis not present

## 2018-03-02 DIAGNOSIS — E118 Type 2 diabetes mellitus with unspecified complications: Secondary | ICD-10-CM | POA: Diagnosis not present

## 2018-03-02 DIAGNOSIS — I252 Old myocardial infarction: Secondary | ICD-10-CM | POA: Diagnosis not present

## 2018-03-04 DIAGNOSIS — M542 Cervicalgia: Secondary | ICD-10-CM | POA: Diagnosis not present

## 2018-03-04 DIAGNOSIS — M545 Low back pain: Secondary | ICD-10-CM | POA: Diagnosis not present

## 2018-03-04 DIAGNOSIS — M79643 Pain in unspecified hand: Secondary | ICD-10-CM | POA: Diagnosis not present

## 2018-03-04 DIAGNOSIS — L409 Psoriasis, unspecified: Secondary | ICD-10-CM | POA: Diagnosis not present

## 2018-03-04 DIAGNOSIS — M1990 Unspecified osteoarthritis, unspecified site: Secondary | ICD-10-CM | POA: Diagnosis not present

## 2018-03-04 DIAGNOSIS — I776 Arteritis, unspecified: Secondary | ICD-10-CM | POA: Diagnosis not present

## 2018-03-06 DIAGNOSIS — M65331 Trigger finger, right middle finger: Secondary | ICD-10-CM | POA: Diagnosis not present

## 2018-03-09 DIAGNOSIS — M5412 Radiculopathy, cervical region: Secondary | ICD-10-CM | POA: Diagnosis not present

## 2018-03-10 ENCOUNTER — Telehealth: Payer: Self-pay | Admitting: Neurology

## 2018-03-10 NOTE — Telephone Encounter (Signed)
Dr. Jannifer Franklin- FYI Called pt, scheduled appt for this Friday at 930am with Dr. Jannifer Franklin. Advised her to bring MRI CD and report with her to appt.

## 2018-03-10 NOTE — Telephone Encounter (Signed)
Pt is falling more and gets dizziness when looking down. Pt said an MRI has been done and Dr Lynann Bologna wants to make sure nothing else is going on before he proceeds with her care. I offered 8/29 at 12:00 but she is wanting to be seen sooner.

## 2018-03-13 ENCOUNTER — Ambulatory Visit (INDEPENDENT_AMBULATORY_CARE_PROVIDER_SITE_OTHER): Payer: Medicare Other | Admitting: Neurology

## 2018-03-13 ENCOUNTER — Encounter: Payer: Self-pay | Admitting: Neurology

## 2018-03-13 VITALS — BP 118/68 | HR 67 | Ht 65.0 in | Wt 186.5 lb

## 2018-03-13 DIAGNOSIS — H8149 Vertigo of central origin, unspecified ear: Secondary | ICD-10-CM | POA: Diagnosis not present

## 2018-03-13 DIAGNOSIS — R413 Other amnesia: Secondary | ICD-10-CM | POA: Diagnosis not present

## 2018-03-13 DIAGNOSIS — H814 Vertigo of central origin: Secondary | ICD-10-CM

## 2018-03-13 NOTE — Patient Instructions (Signed)
We will get MRI of the brain and a carotid doppler study.

## 2018-03-13 NOTE — Progress Notes (Addendum)
Reason for visit: Dizziness  Alexis [name] is a 67 y.o. female  History of present illness:  Alexis [name] is a 67 year old right-handed white female with a history of a mild memory disturbance, word finding problems have been noted in the past.  The patient comes into the office today with a new problem.  She has over the last 2 months noted episodes of dizziness that may occur with standing.  She feels a lightheaded floating sensation as if she might pass out, she denies any palpitations of the heart.  The patient is not sure whether she may have some slight dimming of vision with these events.  She feels at times that the legs are weak and will not hold her up.  She has not had any dizzy episodes while sitting or lying down.  The patient has significant problems with her neck and low back, she is followed by Dr. Lynann Bologna for this.  The patient has just recently had MRI evaluation of the neck and low back.  She is planning on having surgery on her neck in the near future.  The patient denies any problems controlling the bowels or the bladder.  She does have some headaches in the back of the head on top of the head.  The patient will have intermittent tingling sensations going down both arms, right greater than left with tingling all the way down to the hands.  She comes here for evaluation of the dizzy episodes.  She has not sustained syncope so far.  Past Medical History:  Diagnosis Date  . Anxiety   . Arthritis    "knees" (05/25/2014)  . Bursitis of knee    "both"  . Chronic back pain   . Depression   . Diabetes mellitus type II   . Diverticulitis   . Fatty liver   . Fibromyalgia   . Gastric polyp    hyperplastic  . Gastroparesis    "recently dx'd" (05/25/2014)  . GERD (gastroesophageal reflux disease)   . H/O hiatal hernia   . ZOXWRUEA(540.9)    "monthly" (05/25/2014)  . Heart attack ([place]) 2014   "mild"  . Hx of gastritis   . Hyperlipidemia   . Hypertension   .  Irritable bowel syndrome (IBS)   . Memory difficulties 09/18/2017  . Migraine without aura, without mention of intractable migraine without mention of status migrainosus    "related to allergies; have them in the spring and fall" (05/25/2014)  . Obesity   . Pancreatic cyst    peripancreatic cystic lesion  . Renal cyst   . Sleep apnea    "wore mask; took it off in my sleep; quit wearing it" (05/25/2014)  . Sprain of neck 03/18/2013  . Uterine cancer ([place]) dx'd 2000   surg only  . Vasculitis ([place])    "irritates my legs"    Past Surgical History:  Procedure Laterality Date  . APPENDECTOMY  ~ 1967  . BREAST CYST EXCISION Right 1990  . LAPAROSCOPIC CHOLECYSTECTOMY  1990  . LEFT HEART CATHETERIZATION WITH CORONARY ANGIOGRAM N/A 05/26/2014   Procedure: LEFT HEART CATHETERIZATION WITH CORONARY ANGIOGRAM;  Surgeon: Troy Sine, MD;  Location: [facility] CATH LAB;  Service: Cardiovascular;  Laterality: N/A;  . SINUS SURGERY WITH INSTATRAK  2000  . TUBAL LIGATION  ~ 1982  . VAGINAL HYSTERECTOMY  2000    Family History  Problem Relation Age of Onset  . Breast cancer Sister   . Colon polyps Father   .  Heart disease Father   . Colon cancer Maternal Uncle   . Ovarian cancer Maternal Aunt   . Stomach cancer Maternal Aunt   . Diabetes Maternal Aunt   . Heart disease Maternal Uncle   . Heart disease Unknown        Grandparents  . Irritable bowel syndrome Daughter     Social history:  reports that she has never smoked. She has never used smokeless tobacco. She reports that she does not drink alcohol or use drugs.  Medications:  Prior to Admission medications   Medication Sig Start Date End Date Taking? Authorizing Provider  acetaminophen (TYLENOL) 500 MG tablet Take 500 mg by mouth every 6 (six) hours as needed for moderate pain or headache.    Yes [provider]  ALPRAZolam Duanne Moron) 0.5 MG tablet Take 0.5 mg by mouth at bedtime as needed for anxiety.    Yes [provider]    atorvastatin (LIPITOR) 20 MG tablet Take 20 mg by mouth daily.   Yes [provider]  buPROPion (WELLBUTRIN XL) 300 MG 24 hr tablet Take 300 mg by mouth daily.   Yes [provider]  Cholecalciferol (VITAMIN D3) 1000 UNITS CAPS Take 1 capsule by mouth at bedtime.    Yes [provider]  Cyanocobalamin (B-12 PO) Take 1 tablet by mouth at bedtime.   Yes [provider]  FARXIGA 10 MG TABS tablet Take 1 tablet by mouth daily. 10/09/16  Yes [provider]  fish oil-omega-3 fatty acids 1000 MG capsule Take 1 g by mouth at bedtime.    Yes [provider]  insulin glargine (LANTUS) 100 UNIT/ML injection Inject 30 Units into the skin every morning.    Yes [provider]  irbesartan (AVAPRO) 300 MG tablet Take 300 mg by mouth daily.    Yes [provider]  metoprolol succinate (TOPROL-XL) 50 MG 24 hr tablet Take 100 mg by mouth 2 (two) times daily. Take with or immediately following a meal.    Yes [provider]  MINIVELLE 0.025 MG/24HR Apply 1 patch topically 2 (two) times a week. 08/12/16  Yes [provider]  pregabalin (LYRICA) 50 MG capsule Take 50 mg by mouth 2 (two) times daily.   Yes [provider]  pregabalin (LYRICA) 50 MG capsule Lyrica 50 mg capsule  Take 1 capsule twice a day by oral route.   Yes [provider]  Pyridoxine HCl (VITAMIN B-6 PO) Take 1 tablet by mouth daily.   Yes [provider]  RABEprazole (ACIPHEX) 20 MG tablet Take 1 tablet (20 mg total) by mouth 2 (two) times daily. 04/22/12  Yes Lafayette Dragon, MD  tamsulosin (FLOMAX) 0.4 MG CAPS capsule Take 0.4 mg by mouth daily. 07/17/16  Yes [provider]  traMADol (ULTRAM) 50 MG tablet Take 100 mg by mouth 2 (two) times daily.    Yes [provider]  venlafaxine XR (EFFEXOR-XR) 150 MG 24 hr capsule Take 150 mg by mouth 2 (two) times daily.   Yes [provider]  zaleplon (SONATA) 5 MG  capsule Take 5 mg by mouth at bedtime.   Yes [provider]  Dulaglutide (TRULICITY) 1.5 MV/6.7MC SOPN Trulicity 1.5 NO/7.0 mL subcutaneous pen injector  INJECT 0.5 ML ONCE WEEKLY SUBCUTANEOUS 28 DAYS    [provider]      Allergies  Allergen Reactions  . Aspirin Other (See Comments)    Reaction: Vasculitis per MD  . Codeine Rash  REACTION: "makes her itch"  . Naproxen Nausea Only    Reaction: Makes stomach upset/irritated.  . Sulfonamide Derivatives Rash    REACTION: itching    ROS:  Out of a complete 14 system review of symptoms, the patient complains only of the following symptoms, and all other reviewed systems are negative.  Dizziness Neck pain Arm numbness  Blood pressure 118/68, pulse 67, height 5\' 5"  (1.651 m), weight 186 lb 8 oz (84.6 kg), SpO2 97 %.   Blood pressure, right arm, sitting is 132/64.  Blood pressure, right arm, standing is 110/60.  Physical Exam  General: The patient is alert and cooperative at the time of the examination.  The patient is moderately obese.  Eyes: Pupils are equal, round, and reactive to light. Discs are flat bilaterally.  Neck: The neck is supple, no carotid bruits are noted.  Respiratory: The respiratory examination is clear.  Cardiovascular: The cardiovascular examination reveals a regular rate and rhythm, no obvious murmurs or rubs are noted.  Skin: Extremities are without significant edema.  Neurologic Exam  Mental status: The patient is alert and oriented x 3 at the time of the examination. The patient has apparent normal recent and remote memory, with an apparently normal attention span and concentration ability.  Cranial nerves: Facial symmetry is present. There is good sensation of the face to pinprick and soft touch bilaterally. The strength of the facial muscles and the muscles to head turning and shoulder shrug are normal bilaterally. Speech is well enunciated, no aphasia or dysarthria is noted.  Extraocular movements are full. Visual fields are full. The tongue is midline, and the patient has symmetric elevation of the soft palate. No obvious hearing deficits are noted.  Motor: The motor testing reveals 5 over 5 strength of all 4 extremities. Good symmetric motor tone is noted throughout.  Sensory: Sensory testing is intact to pinprick, soft touch and vibration sensation on all 4 extremities. No evidence of extinction is noted.  Coordination: Cerebellar testing reveals good finger-nose-finger and heel-to-shin bilaterally.  Gait and station: Gait is normal. Tandem gait is normal. Romberg is negative. No drift is seen.  Reflexes: Deep tendon reflexes are symmetric and normal bilaterally. Toes are downgoing bilaterally.   Assessment/Plan:  1.  Episodic dizziness  2.  Reported memory disturbance  The patient is having new problems with dizziness that occur only with standing.  The patient does drop her blood pressure with standing to some degree, the episodes of dizziness may represent periods of hypotension.  The patient also claims that she is on another blood pressure medication at night, possibly amlodipine 5 mg but this is not on her medication list.  The patient will be sent for a carotid Doppler study and MRI of the brain.  The patient will try to check her blood pressure during periods of symptoms.  She will follow-up in 6 months.  Jill Alexanders MD 03/13/2018 9:43 AM  [facility] [address] [place],  [address]  Phone [phone] Fax [phone]

## 2018-03-21 ENCOUNTER — Ambulatory Visit
Admission: RE | Admit: 2018-03-21 | Discharge: 2018-03-21 | Disposition: A | Discharge status: Home / Self Care | Payer: Medicare Other | Source: Ambulatory Visit | Attending: Neurology | Admitting: Neurology

## 2018-03-21 DIAGNOSIS — R413 Other amnesia: Secondary | ICD-10-CM | POA: Diagnosis not present

## 2018-03-21 DIAGNOSIS — H814 Vertigo of central origin: Secondary | ICD-10-CM

## 2018-03-21 DIAGNOSIS — R42 Dizziness and giddiness: Secondary | ICD-10-CM | POA: Diagnosis not present

## 2018-03-21 DIAGNOSIS — H8149 Vertigo of central origin, unspecified ear: Secondary | ICD-10-CM | POA: Diagnosis not present

## 2018-03-22 ENCOUNTER — Telehealth: Payer: Self-pay | Admitting: Neurology

## 2018-03-22 NOTE — Telephone Encounter (Signed)
I called the patient.  MRI of the brain shows white matter changes involving the pons, this could explain much of her symptoms of dizziness.  The patient is allergic to aspirin, I would recommend that she go on Plavix following her neck surgery.  The patient is a history of hypertension and diabetes which are significant risk factors for these changes in the brain.    MRI brain 03/21/18:  IMPRESSION: This MRI of the brain without contrast shows the following: 1.     There are extensive T2/FLAIR hyperintense foci within the pons and some scattered foci within the hemispheres.  This is most likely due to chronic microvascular ischemic changes.   2.     Although brain volume is normal for age, there is mild corpus callosum atrophy. 3.     There are no acute findings.

## 2018-03-23 ENCOUNTER — Telehealth: Payer: Self-pay | Admitting: Internal Medicine

## 2018-03-23 NOTE — Telephone Encounter (Signed)
Pt is requesting a call from Dr Jannifer Franklin to discuss the MRI results

## 2018-03-23 NOTE — Telephone Encounter (Signed)
New message    1) Are you dizzy now? No   2) Do you feel faint or have you passed out? No   3) Do you have any other symptoms?headaches, patient has balance issues   4) Have you checked your HR and BP (record if available)?no   The patient states that she went to see her Neurologist and he states that the MRI shows that she is having mini strokes. Patient wanted to schedule an appt with Dr. Debara Pickett his first available appt is not until mid to late September. I offered for the patient to see a PA the patient declined.

## 2018-03-23 NOTE — Telephone Encounter (Signed)
I called the patient, discussed the MRI of the brain with her.  I recommend going on Plavix that she is allergic to aspirin, she will call me if she wants a prescription.  The carotid Doppler study is not going to be done until 9 September.

## 2018-03-23 NOTE — Telephone Encounter (Signed)
Spoke with patient who reports her neurologist and she both feel she needs to see Dr. Debara Pickett. She is due for her 1 year visit. She has been having mini-strokes and issues with hypotension. Patient is requesting an appt with MD but explained is he not in office this week. Scheduled her to see MD on 8/19 @ 0845

## 2018-03-24 MED ORDER — CLOPIDOGREL BISULFATE 75 MG PO TABS: 75.0000 mg | ORAL_TABLET | Freq: Every day | ORAL | 3 refills | Status: DC

## 2018-03-24 NOTE — Addendum Note (Signed)
Addended by: Kathrynn Ducking on: 03/24/2018 03:02 PM   Modules accepted: Orders

## 2018-03-24 NOTE — Telephone Encounter (Signed)
A prescription for Plavix will be sent in.

## 2018-03-24 NOTE — Telephone Encounter (Signed)
Pt called back. She has appt with cardiologist on Monday. She said it would be ok to call in plavix to [place]

## 2018-03-30 ENCOUNTER — Ambulatory Visit (INDEPENDENT_AMBULATORY_CARE_PROVIDER_SITE_OTHER): Payer: Medicare Other | Admitting: Internal Medicine

## 2018-03-30 ENCOUNTER — Encounter: Payer: Self-pay | Admitting: Internal Medicine

## 2018-03-30 VITALS — BP 140/80 | HR 70 | Ht 65.0 in | Wt 182.0 lb

## 2018-03-30 DIAGNOSIS — I10 Essential (primary) hypertension: Secondary | ICD-10-CM

## 2018-03-30 DIAGNOSIS — I252 Old myocardial infarction: Secondary | ICD-10-CM | POA: Diagnosis not present

## 2018-03-30 DIAGNOSIS — E785 Hyperlipidemia, unspecified: Secondary | ICD-10-CM

## 2018-03-30 DIAGNOSIS — E118 Type 2 diabetes mellitus with unspecified complications: Secondary | ICD-10-CM | POA: Diagnosis not present

## 2018-03-30 DIAGNOSIS — I2510 Atherosclerotic heart disease of native coronary artery without angina pectoris: Secondary | ICD-10-CM | POA: Diagnosis not present

## 2018-03-30 DIAGNOSIS — G4733 Obstructive sleep apnea (adult) (pediatric): Secondary | ICD-10-CM | POA: Diagnosis not present

## 2018-03-30 DIAGNOSIS — Z23 Encounter for immunization: Secondary | ICD-10-CM | POA: Diagnosis not present

## 2018-03-30 DIAGNOSIS — E7800 Pure hypercholesterolemia, unspecified: Secondary | ICD-10-CM | POA: Diagnosis not present
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
## 2018-03-30 NOTE — Patient Instructions (Signed)
Your physician wants you to follow-up in: ONE YEAR with Dr. Hilty. You will receive a reminder letter in the mail two months in advance. If you don't receive a letter, please call our office to schedule the follow-up appointment.  

## 2018-03-31 ENCOUNTER — Encounter: Payer: Self-pay | Admitting: Internal Medicine

## 2018-04-03 ENCOUNTER — Ambulatory Visit (HOSPITAL_COMMUNITY)
Admission: RE | Admit: 2018-04-03 | Discharge: 2018-04-03 | Disposition: A | Discharge status: Home / Self Care | Payer: Medicare Other | Source: Ambulatory Visit | Attending: Neurology | Admitting: Neurology

## 2018-04-03 DIAGNOSIS — H8149 Vertigo of central origin, unspecified ear: Secondary | ICD-10-CM

## 2018-04-03 DIAGNOSIS — R413 Other amnesia: Secondary | ICD-10-CM | POA: Insufficient documentation

## 2018-04-03 DIAGNOSIS — H814 Vertigo of central origin: Secondary | ICD-10-CM

## 2018-04-03 DIAGNOSIS — I6523 Occlusion and stenosis of bilateral carotid arteries: Secondary | ICD-10-CM | POA: Diagnosis not present

## 2018-04-03 NOTE — Progress Notes (Signed)
*  PRELIMINARY RESULTS* Vascular Ultrasound Carotid Duplex (Doppler) has been completed.  Findings suggest 1-39% internal carotid artery stenosis bilaterally. Vertebral arteries are patent with antegrade flow.  04/03/2018 2:39 PM Maudry Mayhew, MHA, RVT, RDCS, RDMS

## 2018-04-05 ENCOUNTER — Telehealth: Payer: Self-pay | Admitting: Neurology

## 2018-04-05 NOTE — Telephone Encounter (Signed)
  I called the patient.  The carotid Doppler study is unremarkable, MRI of the brain showed some mild small vessel changes in the brain and brainstem, it is possible that the dizziness that she is experiencing could be related to neck pain and spasm in the neck.  I see no neurologic contraindications for surgery, it is possible that following surgery, after recovery, the patient could improve with her dizziness.  Carotid doppler 04/03/18:  Final Interpretation: Right Carotid: Velocities in the right ICA are consistent with a 1-39% stenosis.  Left Carotid: Velocities in the left ICA are consistent with a 1-39% stenosis.  Vertebrals: Bilateral vertebral arteries demonstrate antegrade flow. Subclavians: Normal flow hemodynamics were seen in bilateral subclavian       arteries.

## 2018-04-20 ENCOUNTER — Encounter (HOSPITAL_COMMUNITY): Payer: Medicare Other

## 2018-04-20 DIAGNOSIS — M5412 Radiculopathy, cervical region: Secondary | ICD-10-CM | POA: Diagnosis not present

## 2018-04-24 DIAGNOSIS — M1990 Unspecified osteoarthritis, unspecified site: Secondary | ICD-10-CM | POA: Diagnosis not present

## 2018-04-27 ENCOUNTER — Encounter: Payer: Self-pay | Admitting: Dietician

## 2018-04-27 ENCOUNTER — Encounter: Payer: Medicare Other | Attending: Internal Medicine | Admitting: Dietician

## 2018-04-27 DIAGNOSIS — Z713 Dietary counseling and surveillance: Secondary | ICD-10-CM | POA: Diagnosis not present

## 2018-04-27 DIAGNOSIS — E669 Obesity, unspecified: Secondary | ICD-10-CM | POA: Insufficient documentation

## 2018-04-27 DIAGNOSIS — Z794 Long term (current) use of insulin: Secondary | ICD-10-CM

## 2018-04-27 DIAGNOSIS — E119 Type 2 diabetes mellitus without complications: Secondary | ICD-10-CM | POA: Insufficient documentation

## 2018-04-27 DIAGNOSIS — E118 Type 2 diabetes mellitus with unspecified complications: Secondary | ICD-10-CM
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
## 2018-04-27 NOTE — Patient Instructions (Addendum)
Consider Free Stye Libre Consider [employer] App    Low fat- bake, broiled, grilled and very little added fat Have a snack only if you are hungry.  Aim for 2-3 Carb Choices per meal (30-45 grams) +/- 1 either way  Aim for 0-1 Carbs per snack if hungry  Include protein in moderation with your meals and snacks Consider reading food labels for Total Carbohydrate and Fat Grams of foods Consider  increasing your activity level by walking for 20 minutes daily as tolerated Consider checking BG at alternate times per day as directed by MD  Consider taking medication as directed by MD

## 2018-04-29 ENCOUNTER — Other Ambulatory Visit: Payer: Self-pay | Admitting: Orthopedic Surgery

## 2018-05-04 ENCOUNTER — Encounter (HOSPITAL_COMMUNITY): Payer: Self-pay

## 2018-05-05 ENCOUNTER — Other Ambulatory Visit: Payer: Self-pay

## 2018-05-05 ENCOUNTER — Inpatient Hospital Stay (HOSPITAL_COMMUNITY): Admission: RE | Admit: 2018-05-05 | Payer: Medicare Other | Source: Ambulatory Visit

## 2018-05-05 ENCOUNTER — Emergency Department (HOSPITAL_COMMUNITY)
Admission: EM | Admit: 2018-05-05 | Discharge: 2018-05-05 | Disposition: A | Discharge status: Home / Self Care | Payer: Medicare Other | Attending: Emergency Medicine | Admitting: Emergency Medicine

## 2018-05-05 ENCOUNTER — Encounter (HOSPITAL_COMMUNITY): Payer: Self-pay

## 2018-05-05 ENCOUNTER — Emergency Department (HOSPITAL_COMMUNITY): Payer: Medicare Other

## 2018-05-05 DIAGNOSIS — Z7984 Long term (current) use of oral hypoglycemic drugs: Secondary | ICD-10-CM | POA: Insufficient documentation

## 2018-05-05 DIAGNOSIS — E119 Type 2 diabetes mellitus without complications: Secondary | ICD-10-CM | POA: Insufficient documentation

## 2018-05-05 DIAGNOSIS — M7989 Other specified soft tissue disorders: Secondary | ICD-10-CM | POA: Diagnosis not present

## 2018-05-05 DIAGNOSIS — Z79899 Other long term (current) drug therapy: Secondary | ICD-10-CM | POA: Insufficient documentation

## 2018-05-05 DIAGNOSIS — I10 Essential (primary) hypertension: Secondary | ICD-10-CM | POA: Diagnosis not present

## 2018-05-05 DIAGNOSIS — M79672 Pain in left foot: Secondary | ICD-10-CM | POA: Diagnosis not present

## 2018-05-05 NOTE — ED Triage Notes (Signed)
Pt states she fell last night around 11pm and tripped over a box. Swelling noted to the left foot.

## 2018-05-05 NOTE — ED Notes (Signed)
ED Provider at bedside. 

## 2018-05-05 NOTE — ED Notes (Signed)
Patient verbalizes understanding of discharge instructions. Opportunity for questioning and answers were provided. Armband removed by staff, pt discharged from ED in wheelchair.  

## 2018-05-05 NOTE — ED Provider Notes (Signed)
Medical screening examination/treatment/procedure(s) were conducted as a shared visit with non-physician practitioner(s) and myself.  I personally evaluated the patient during the encounter.  Fall with left foot pain, ecchymosis and swelling last night.  X-ray without evidence of fracture.  Likely ankle sprain.  Postop shoe symptomatic management. Stable for discharge.    Merrily Pew, MD 05/05/18 1626

## 2018-05-05 NOTE — ED Provider Notes (Signed)
[facility] EMERGENCY DEPARTMENT Provider Note   CSN: [id number] Arrival date & time: 05/05/18  1001     History   Chief Complaint Chief Complaint  Patient presents with  . Fall    HPI Alexis [name] is a 67 y.o. female presenting after fall that occurred at 11:00pm last night.  Patient states that she was cleaning her room when she tripped over a box, turning her left foot before falling to the ground.  Patient denies head injury, loss of consciousness or any other injury.  Only endorsing pain to left foot.  Patient states that her foot turned forward, rolling over the top.  Patient states that she had pain and swelling immediately to her left foot that she describes as a throbbing, moderate in intensity and constant.  Patient states that pain is worsened with ambulation and palpation and relieved with her normal home dose of tramadol and Tylenol.  Patient states that her pain is manageable at this time and does not want additional pain medication.  Of note patient is followed by [facility] at this time and has a scheduled procedure on 05/07/2018 for cervical decompression.  Patient has been taken off of her Plavix for the surgery.  HPI  Past Medical History:  Diagnosis Date  . Anxiety   . Arthritis    "knees" (05/25/2014)  . Bursitis of knee    "both"  . Chronic back pain   . Depression   . Diabetes mellitus type II   . Diverticulitis   . Fatty liver   . Fibromyalgia   . Gastric polyp    hyperplastic  . Gastroparesis    "recently dx'd" (05/25/2014)  . GERD (gastroesophageal reflux disease)   . H/O hiatal hernia   . STMHDQQI(297.9)    "monthly" (05/25/2014)  . Heart attack ([place]) 2014   "mild"  . Hx of gastritis   . Hyperlipidemia   . Hypertension   . Irritable bowel syndrome (IBS)   . Memory difficulties 09/18/2017  . Migraine without aura, without mention of intractable migraine without mention of status migrainosus    "related to  allergies; have them in the spring and fall" (05/25/2014)  . Obesity   . Pancreatic cyst    peripancreatic cystic lesion  . Renal cyst   . Sleep apnea    "wore mask; took it off in my sleep; quit wearing it" (05/25/2014)  . Sprain of neck 03/18/2013  . Uterine cancer ([place]) dx'd 2000   surg only  . Vasculitis ([place])    "irritates my legs"    Patient Active Problem List   Diagnosis Date Noted  . Type 2 diabetes mellitus ([place]) 02/18/2018  . Memory difficulties 09/18/2017  . Coronary artery calcification seen on computed tomography 04/09/2017  . Word finding difficulty 09/05/2016  . Cervicogenic headache 09/05/2016  . Palpitation 08/22/2016  . Bilateral leg edema 08/22/2016  . Obesity (BMI 30-39.9) 05/25/2014  . Precordial chest pain 05/25/2014  . Migraine without aura, without mention of intractable migraine without mention of status migrainosus 03/18/2013  . Chest pain 01/29/2013  . Constipation 02/24/2012  . DM (diabetes mellitus), type 2, uncontrolled ([place]) 08/24/2010  . Hyperlipidemia 08/24/2010  . ANXIETY 08/24/2010  . Depression 08/24/2010  . Essential hypertension 08/24/2010  . GERD 08/24/2010  . IRRITABLE BOWEL SYNDROME 08/24/2010  . FATTY LIVER DISEASE 08/24/2010  . ARTHRITIS 08/24/2010  . Fibromyalgia 08/24/2010  . OSA (obstructive sleep apnea) 08/24/2010  . ABDOMINAL PAIN-RUQ 08/24/2010  . UTERINE CANCER,  HX OF 08/24/2010  . GASTRITIS, HX OF 08/24/2010    Past Surgical History:  Procedure Laterality Date  . APPENDECTOMY  ~ 1967  . BREAST CYST EXCISION Right 1990  . LAPAROSCOPIC CHOLECYSTECTOMY  1990  . LEFT HEART CATHETERIZATION WITH CORONARY ANGIOGRAM N/A 05/26/2014   Procedure: LEFT HEART CATHETERIZATION WITH CORONARY ANGIOGRAM;  Surgeon: Troy Sine, MD;  [facility] C[facility] LAB;  Service: Cardiovascular;  Laterality: N/A;  . SINUS SURGERY WITH INSTATRAK  2000  . TUBAL LIGATION  ~ 1982  . VAGINAL HYSTERECTOMY  2000     OB History    Gravida       Para      Term      Preterm      AB      Living  2     SAB      TAB      Ectopic      Multiple      Live Births               Home Medications    Prior to Admission medications   Medication Sig Start Date End Date Taking? Authorizing Provider  acetaminophen (TYLENOL) 500 MG tablet Take 500 mg by mouth every 6 (six) hours as needed for moderate pain or headache.     [provider]  ALPRAZolam Duanne Moron) 0.5 MG tablet Take 0.5-1 mg by mouth at bedtime as needed for anxiety.     [provider]  atorvastatin (LIPITOR) 20 MG tablet Take 20 mg by mouth daily.    [provider]  b complex vitamins tablet Take 1 tablet by mouth daily.    [provider]  buPROPion (WELLBUTRIN XL) 300 MG 24 hr tablet Take 300 mg by mouth daily.    [provider]  Cholecalciferol (VITAMIN D3) 1000 UNITS CAPS Take 1,000 capsules by mouth at bedtime.     [provider]  clopidogrel (PLAVIX) 75 MG tablet Take 1 tablet (75 mg total) by mouth daily. 03/24/18   Kathrynn Ducking, MD  Cyanocobalamin (B-12 PO) Take 1 tablet by mouth at bedtime.    [provider]  Dulaglutide (TRULICITY) 1.5 VZ/5.6LO SOPN Inject 1.5 mg into the skin every Wednesday.     [provider]  FARXIGA 10 MG TABS tablet Take 10 mg by mouth daily before breakfast.  10/09/16   [provider]  fish oil-omega-3 fatty acids 1000 MG capsule Take 1 g by mouth at bedtime.     [provider]  Insulin Degludec (TRESIBA FLEXTOUCH [place]) Inject 25 Units into the skin daily before breakfast.     [provider]  irbesartan (AVAPRO) 300 MG tablet Take 300 mg by mouth daily.     [provider]  metFORMIN (GLUCOPHAGE) 1000 MG tablet Take 500 mg by mouth daily with breakfast.    [provider]  metoprolol succinate (TOPROL-XL) 50 MG 24 hr tablet Take 100 mg by mouth 2 (two) times daily. Take with or immediately following a meal.      [provider]  MINIVELLE 0.025 MG/24HR Apply 1 patch topically 2 (two) times a week. 08/12/16   [provider]  pregabalin (LYRICA) 50 MG capsule Take 50 mg by mouth 2 (two) times daily.    [provider]  RABEprazole (ACIPHEX) 20 MG tablet Take 1 tablet (20 mg total) by mouth 2 (two) times daily. 04/22/12   Lafayette Dragon, MD  rizatriptan (MAXALT) 10 MG tablet  Take 10 mg by mouth as needed for migraine. May repeat in 2 hours if needed     [provider]  tamsulosin (FLOMAX) 0.4 MG CAPS capsule Take 0.4 mg by mouth at bedtime.  07/17/16   [provider]  traMADol (ULTRAM) 50 MG tablet Take 100 mg by mouth 2 (two) times daily.     [provider]  venlafaxine XR (EFFEXOR-XR) 150 MG 24 hr capsule Take 150 mg by mouth 2 (two) times daily.    [provider]  zaleplon (SONATA) 5 MG capsule Take 5 mg by mouth at bedtime.    [provider]    Family History Family History  Problem Relation Age of Onset  . Breast cancer Sister   . Colon polyps Father   . Heart disease Father   . Colon cancer Maternal Uncle   . Ovarian cancer Maternal Aunt   . Stomach cancer Maternal Aunt   . Diabetes Maternal Aunt   . Heart disease Maternal Uncle   . Heart disease Unknown        Grandparents  . Irritable bowel syndrome Daughter     Social History Social History   Tobacco Use  . Smoking status: Never Smoker  . Smokeless tobacco: Never Used  Substance Use Topics  . Alcohol use: No  . Drug use: No     Allergies   Aspirin; Codeine; Naproxen; and Sulfonamide derivatives   Review of Systems Review of Systems  Constitutional: Negative.  Negative for chills, fatigue and fever.  Respiratory: Negative.  Negative for shortness of breath.   Cardiovascular: Negative.  Negative for chest pain.  Musculoskeletal: Positive for arthralgias and joint swelling. Negative for back pain, myalgias and neck pain.  Skin: Positive for color  change. Negative for pallor and wound.  Neurological: Negative.  Negative for dizziness, syncope, weakness, light-headedness, numbness and headaches.    Physical Exam Updated Vital Signs BP (!) 118/57 (BP Location: Right Arm)   Pulse 73   Temp 98.8 F (37.1 C) (Oral)   Resp 17   Ht 5\' 5"  (1.651 m)   Wt 81.6 kg   SpO2 94%   BMI 29.95 kg/m   Physical Exam  Constitutional: She appears well-developed and well-nourished. No distress.  HENT:  Head: Normocephalic and atraumatic.  Right Ear: External ear normal.  Left Ear: External ear normal.  Nose: Nose normal.  Eyes: Pupils are equal, round, and reactive to light. EOM are normal.  Neck: Trachea normal and normal range of motion. No tracheal deviation present.  Cardiovascular:  Pulses:      Dorsalis pedis pulses are 2+ on the right side, and 2+ on the left side.       Posterior tibial pulses are 2+ on the right side, and 2+ on the left side.  Pulmonary/Chest: Effort normal. No respiratory distress.  Abdominal: Soft. There is no tenderness. There is no rebound and no guarding.  Musculoskeletal: Normal range of motion.       Right ankle: Normal.       Left ankle: Normal. She exhibits normal range of motion.       Right lower leg: Normal.       Left lower leg: Normal.       Right foot: Normal.       Left foot: There is tenderness and swelling. There is normal capillary refill, no crepitus and no deformity.  Left foot: Mild swelling and mild ecchymosis to top of foot.  No deformity, skin break,  erythema or increased warmth.  Sensation intact to light touch to all toes.  Capillary refill intact to all toes.  Pedal pulses strong and equal bilaterally.  Patient is able to actively dorsiflex and plantarflex her foot with some increase in pain to top of foot; range of motion decreased due to pain.  Patient is able to actively invert and evert foot with some increase in pain; range of motion decreased due to pain. Passive range of motion  intact with increase in pain. All compartments are soft.  Feet:  Right Foot:  Protective Sensation: 3 sites tested. 3 sites sensed.  Left Foot:  Protective Sensation: 3 sites tested. 3 sites sensed.  Neurological: She is alert. No sensory deficit. GCS eye subscore is 4. GCS verbal subscore is 5. GCS motor subscore is 6.  Skin: Skin is warm and dry. Capillary refill takes less than 2 seconds.  Psychiatric: She has a normal mood and affect. Her behavior is normal.   ED Treatments / Results  Labs (all labs ordered are listed, but only abnormal results are displayed) Labs Reviewed - No data to display  EKG None  Radiology Dg Foot Complete Left  Result Date: 05/05/2018 CLINICAL DATA:  Pain over the dorsum of the foot with swelling after tripping yesterday EXAM: LEFT FOOT - COMPLETE 3+ VIEW COMPARISON:  Left foot film of 02/18/2018 FINDINGS: Tarsal metatarsal alignment is normal. There are degenerative changes again noted at the left first MTP joint with some loss of joint space and sclerosis. No fracture is seen. Joint spaces otherwise appear normal. IMPRESSION: No acute fracture.  Degenerative change of the left first MTP joint. Electronically Signed   By: Ivar Drape M.D.   On: 05/05/2018 11:15    Procedures Procedures (including critical care time)  Medications Ordered in ED Medications - No data to display   Initial Impression / Assessment and Plan / ED Course  I have reviewed the triage vital signs and the nursing notes.  Pertinent labs & imaging results that were available during my care of the patient were reviewed by me and considered in my medical decision making (see chart for details).  Clinical Course as of May 05 1141  Tue May 05, 2018  1141 Dr. Dayna Barker has seen and evaluated patient.   [BM]    Clinical Course User Index [BM] Deliah Boston, PA-C   67 year old female presenting for pain and swelling to left foot.  Patient with negative imaging today.  Patient  with full sensation to light touch, capillary refill and strong equal pedal pulses to foot.  Compartments are soft.  Movement intact but reduced due to pain.  Patient has close orthopedic follow-up with [facility] has been encouraged to call their office as soon as possible to schedule an appointment.  Patient is afebrile, not tachycardic, not hypotensive and well-appearing, no acute distress.  Patient has been provided with postop shoe here in emergency department.  Patient refuses crutches here in emergency department.   At this time there does not appear to be any evidence of an acute emergency medical condition and the patient appears stable for discharge with appropriate outpatient follow up. Diagnosis was discussed with patient who verbalizes understanding of care plan and is agreeable to discharge. I have discussed return precautions with patient and husband at bedside who verbalize understanding of return precautions. Patient strongly encouraged to follow-up with their PCP. All questions answered.  Patient seen and evaluated by Dr. Dayna Barker who agrees with plan to discharge  with follow-up.     Note: Portions of this report may have been transcribed using voice recognition software. Every effort was made to ensure accuracy; however, inadvertent computerized transcription errors may still be present.  Final Clinical Impressions(s) / ED Diagnoses   Final diagnoses:  Foot pain, left    ED Discharge Orders    None       Gari Crown 05/05/18 1149    Mesner, Corene Cornea, MD 05/05/18 1645

## 2018-05-05 NOTE — Discharge Instructions (Addendum)
Please return to the Emergency Department for any new or worsening symptoms or if your symptoms do not improve. Please be sure to follow up with your Primary Care Physician as soon as possible regarding your visit today. If you do not have a Primary Doctor please use the resources below to establish one. Your x-ray today was negative for fractures.  However x-rays do not look at ligaments or tendons, it is still possible that there is an injury to your foot causing the pain, please follow-up with your orthopedic doctor as soon as possible for further evaluation. Please use rest, ice and elevation to help with your pain and swelling.  Contact a health care provider if: Your pain does not get better after a few days of self-care. Your pain gets worse. You cannot stand on your foot. Get help right away if: Your foot is numb or tingling. Your foot or toes are swollen. Your foot or toes turn white or blue. You have warmth and redness along your foot.  RESOURCE GUIDE  Chronic Pain Problems: Contact [facility]  [phone] Patients need to be referred by their primary care doctor.  Insufficient Money for Medicine: Contact United Way:  call "211" or [place] [phone].  No Primary Care Doctor: Call [facility]  [phone] - can help you locate a primary care doctor that  accepts your insurance, provides certain services, etc. Physician Referral Service- [phone]  Agencies that provide inexpensive medical care: [facility]  [facility]  [phone] [facility]  [phone] [facility]  [phone] [facility]  [phone] [facility]  [phone]  [place] Providers: [facility]- [address], [address]  [phone], Mon-Fri 9am-7pm, Sat 9am-1pm [place], [address] [place], [address] [place]  [place]- [address]  [place], [address], [phone]  Only accepts [place] patients after they have their name  applied to their card  Self Pay (no insurance) in [facility]: Sickle Cell Patients: Dr Kevan Ny, [facility]  [place], [facility]- [place]  [facility] [place]- [address], [facility]- see information above (Speak to [employer] if you do not have insurance)       -  [facility]- [place], Troy Brimson,  [place]       -  [place] [address], [phone]       -  Dr Vista Lawman-  [address], [place], [place] [facility]- [address] [phone]       -  [facility] [place]- [address], [place], also [address] [phone]       -    [facility]- [address], [place], 1st & 3rd Saturday   every month, 10am-1pm  1) Find a Doctor and Pay Out of Pocket Although you won't have to  find out who is covered by your insurance plan, it is a good idea to ask around and get recommendations. You will then need to call the office and see if the doctor you have chosen will accept you as a new patient and what types of options they offer for patients who are self-pay. Some doctors offer discounts or will set up payment plans for their patients who do not have insurance, but you will need to ask so you aren't surprised when you get to your appointment.  2) Contact Your Local Health Department Not all health departments have doctors that can see patients for sick visits, but many do, so it is worth a call to see if yours does. If you don't know where your local health  department is, you can check in your phone book. The CDC also has a tool to help you locate your state's health department, and many state websites also have listings of all of their local health departments.  3) Find a [facility] If your illness is not likely to be very severe or complicated, you may want to try a walk in clinic. These are popping up all over the country in pharmacies, drugstores, and shopping centers. They're usually staffed by nurse practitioners or physician assistants that have been trained to treat common illnesses and complaints. They're usually fairly quick and inexpensive. However, if you have serious medical issues or chronic medical problems, these are probably not your best option  [facility] [place], [facility], [address] [place], phone [phone] or [phone].  Monday - Friday, call for an appointment. [place], [facility], [address], [place], phone [phone] or [phone].  Monday - Friday, call for an appointment.  Abuse/Neglect: [place] [phone] [place] [phone] (After Hours)  Emergency Shelter:  [facility] [phone]  Maternity Homes: [facility] [phone] [place] [phone]  MRSA Hotline #:   240-620-1178  [facility] [place] Dept. [address]         [place] Phone:  [phone]                                  Phone:  [phone]                   Phone:  [place], [place] in [place], [address]  [phone], [place] [phone] or [phone] (After Hours)   [place]  Substance Abuse Resources: Alcohol and Drug Services  [place] [phone] [facility] [place] Chinita Pester [phone] Residential & Outpatient Substance Abuse Program  [phone]  Psychological Services: [place]  [phone] [place]  [place], [place] [address] [place], [place]: [phone] or [phone], [employer]  Dental Assistance  If unable to pay or uninsured, contact:  [facility] or [facility] [place]. to become qualified for the adult dental clinic.  Patients with Medicaid: [facility] [phone] [address], [place] [address], [phone]  If unable to pay, or uninsured, contact [facility] [phone]) or [employer] [phone] in [place], [place] in [facility]) to become qualified for the adult dental clinic   Other [place]- [place], [place], [place], [address], [place], [place], 2nd and 4th Thursday of the month at 6:30am.  10 clients each day by appointment, can sometimes see walk-in patients if someone does not show for an appointment. [facility]- [address] Hillard Danker [place], [place], [address], [place], [place], [place], [address], [place] Department- [phone] [place] [facility] [phone]

## 2018-05-05 NOTE — ED Notes (Signed)
Patient transported to X-ray 

## 2018-05-06 ENCOUNTER — Encounter (HOSPITAL_COMMUNITY): Payer: Self-pay | Admitting: *Deleted

## 2018-05-06 ENCOUNTER — Other Ambulatory Visit: Payer: Self-pay

## 2018-05-06 DIAGNOSIS — S93402A Sprain of unspecified ligament of left ankle, initial encounter: Secondary | ICD-10-CM | POA: Diagnosis not present

## 2018-05-06 DIAGNOSIS — S93602A Unspecified sprain of left foot, initial encounter: Secondary | ICD-10-CM | POA: Diagnosis not present

## 2018-05-06 NOTE — Progress Notes (Signed)
Pt denies SOB and chest pain. Pt stated that she is under the care of Dr, Debara Pickett, Cardiology (Clearance note in Epic). Pt denies recent labs. Pt stated that last dose of Plavix was 04/20/18. Pt made aware to stop taking vitamins fish oil and herbal medications. Do not take any NSAIDs ie: Ibuprofen, Advil, Naproxen (Aleve), MOtrin, BC and Goody Powder. Pt made aware to not take Meformin and [place] on DOS. Pt made aware to only take 12 units of Tresiba insulin if blood glucose (BG ) > 70. Pt made aware to check BG every 2 hours prior to arrival to hospital on DOS. Pt made aware to treat a BG < 70 with  4 ounces of apple juice, wait 15 minutes after intervention to recheck BG, if BG remains < 70, call Short Stay unit to speak with a nurse. Pt verbalized understanding of all pre-op instructions.

## 2018-05-06 NOTE — Anesthesia Preprocedure Evaluation (Addendum)
Anesthesia Evaluation  Patient identified by MRN, date of birth, ID band Patient awake    Reviewed: Allergy & Precautions, H&P , NPO status , Patient's Chart, lab work & pertinent test results, reviewed documented beta blocker date and time   Airway Mallampati: III  TM Distance: >3 FB Neck ROM: full    Dental no notable dental hx. (+) Teeth Intact, Dental Advisory Given   Pulmonary sleep apnea ,    Pulmonary exam normal breath sounds clear to auscultation       Cardiovascular Exercise Tolerance: Good hypertension, Pt. on medications and Pt. on home beta blockers + Past MI  negative cardio ROS   Rhythm:regular Rate:Normal  EF15' =  65 % MI 2014   Neuro/Psych  Headaches, PSYCHIATRIC DISORDERS Anxiety Depression  Neuromuscular disease    GI/Hepatic Neg liver ROS, hiatal hernia, GERD  Medicated,  Endo/Other  diabetes, Type 2  Renal/GU   negative genitourinary   Musculoskeletal  (+) Arthritis , Fibromyalgia -  Abdominal   Peds  Hematology negative hematology ROS (+)   Anesthesia Other Findings SMALL MOUTH  Reproductive/Obstetrics negative OB ROS                           Anesthesia Physical Anesthesia Plan  ASA: III  Anesthesia Plan: General   Post-op Pain Management:    Induction: Intravenous  PONV Risk Score and Plan: 3 and Ondansetron  Airway Management Planned: Oral ETT and Video Laryngoscope Planned  Additional Equipment:   Intra-op Plan:   Post-operative Plan: Extubation in OR  Informed Consent: I have reviewed the patients History and Physical, chart, labs and discussed the procedure including the risks, benefits and alternatives for the proposed anesthesia with the patient or authorized representative who has indicated his/her understanding and acceptance.   Dental Advisory Given  Plan Discussed with: CRNA, Anesthesiologist and Surgeon  Anesthesia Plan Comments:  (  )       Anesthesia Quick Evaluation

## 2018-05-07 ENCOUNTER — Ambulatory Visit (HOSPITAL_COMMUNITY)
Admission: RE | Admit: 2018-05-07 | Discharge: 2018-05-08 | Disposition: A | Discharge status: Home / Self Care | Payer: Medicare Other | Source: Ambulatory Visit | Attending: Orthopedic Surgery | Admitting: Orthopedic Surgery

## 2018-05-07 ENCOUNTER — Ambulatory Visit (HOSPITAL_COMMUNITY): Payer: Medicare Other | Admitting: Anesthesiology

## 2018-05-07 ENCOUNTER — Encounter (HOSPITAL_COMMUNITY): Payer: Self-pay | Admitting: Surgery

## 2018-05-07 ENCOUNTER — Ambulatory Visit (HOSPITAL_COMMUNITY): Payer: Medicare Other

## 2018-05-07 ENCOUNTER — Other Ambulatory Visit: Payer: Self-pay

## 2018-05-07 ENCOUNTER — Ambulatory Visit (HOSPITAL_COMMUNITY)
Admission: RE | Disposition: A | Discharge status: Home / Self Care | Payer: Self-pay | Source: Ambulatory Visit | Attending: Orthopedic Surgery

## 2018-05-07 DIAGNOSIS — G959 Disease of spinal cord, unspecified: Secondary | ICD-10-CM | POA: Diagnosis present

## 2018-05-07 DIAGNOSIS — E1143 Type 2 diabetes mellitus with diabetic autonomic (poly)neuropathy: Secondary | ICD-10-CM | POA: Diagnosis not present

## 2018-05-07 DIAGNOSIS — Z8542 Personal history of malignant neoplasm of other parts of uterus: Secondary | ICD-10-CM | POA: Diagnosis not present

## 2018-05-07 DIAGNOSIS — M4322 Fusion of spine, cervical region: Secondary | ICD-10-CM | POA: Diagnosis not present

## 2018-05-07 DIAGNOSIS — F419 Anxiety disorder, unspecified: Secondary | ICD-10-CM | POA: Diagnosis not present

## 2018-05-07 DIAGNOSIS — Z794 Long term (current) use of insulin: Secondary | ICD-10-CM | POA: Diagnosis not present

## 2018-05-07 DIAGNOSIS — E669 Obesity, unspecified: Secondary | ICD-10-CM | POA: Diagnosis not present

## 2018-05-07 DIAGNOSIS — G992 Myelopathy in diseases classified elsewhere: Secondary | ICD-10-CM | POA: Diagnosis not present

## 2018-05-07 DIAGNOSIS — I10 Essential (primary) hypertension: Secondary | ICD-10-CM | POA: Diagnosis not present

## 2018-05-07 DIAGNOSIS — Z7902 Long term (current) use of antithrombotics/antiplatelets: Secondary | ICD-10-CM | POA: Insufficient documentation

## 2018-05-07 DIAGNOSIS — M797 Fibromyalgia: Secondary | ICD-10-CM | POA: Diagnosis not present

## 2018-05-07 DIAGNOSIS — G4730 Sleep apnea, unspecified: Secondary | ICD-10-CM | POA: Insufficient documentation

## 2018-05-07 DIAGNOSIS — K219 Gastro-esophageal reflux disease without esophagitis: Secondary | ICD-10-CM | POA: Diagnosis not present

## 2018-05-07 DIAGNOSIS — G43909 Migraine, unspecified, not intractable, without status migrainosus: Secondary | ICD-10-CM | POA: Insufficient documentation

## 2018-05-07 DIAGNOSIS — K3184 Gastroparesis: Secondary | ICD-10-CM | POA: Diagnosis not present

## 2018-05-07 DIAGNOSIS — I252 Old myocardial infarction: Secondary | ICD-10-CM | POA: Diagnosis not present

## 2018-05-07 DIAGNOSIS — M5412 Radiculopathy, cervical region: Secondary | ICD-10-CM | POA: Insufficient documentation

## 2018-05-07 DIAGNOSIS — E785 Hyperlipidemia, unspecified: Secondary | ICD-10-CM | POA: Diagnosis not present

## 2018-05-07 DIAGNOSIS — M4802 Spinal stenosis, cervical region: Secondary | ICD-10-CM | POA: Insufficient documentation

## 2018-05-07 DIAGNOSIS — M4712 Other spondylosis with myelopathy, cervical region: Secondary | ICD-10-CM | POA: Diagnosis not present

## 2018-05-07 DIAGNOSIS — Z79890 Hormone replacement therapy (postmenopausal): Secondary | ICD-10-CM | POA: Insufficient documentation

## 2018-05-07 DIAGNOSIS — Z419 Encounter for procedure for purposes other than remedying health state, unspecified: Secondary | ICD-10-CM

## 2018-05-07 DIAGNOSIS — Z6829 Body mass index (BMI) 29.0-29.9, adult: Secondary | ICD-10-CM | POA: Diagnosis not present

## 2018-05-07 DIAGNOSIS — Z79899 Other long term (current) drug therapy: Secondary | ICD-10-CM | POA: Insufficient documentation

## 2018-05-07 DIAGNOSIS — F329 Major depressive disorder, single episode, unspecified: Secondary | ICD-10-CM | POA: Diagnosis not present

## 2018-05-07 HISTORY — PX: ANTERIOR CERVICAL DECOMPRESSION/DISCECTOMY FUSION 4 LEVELS: SHX5556

## 2018-05-07 HISTORY — DX: Presence of spectacles and contact lenses: Z97.3

## 2018-05-07 LAB — ABO/RH: ABO/RH(D): O POS

## 2018-05-07 LAB — URINALYSIS, ROUTINE W REFLEX MICROSCOPIC
Bilirubin Urine: NEGATIVE
Glucose, UA: >=500 mg/dL — AB
Hgb urine dipstick: NEGATIVE
Ketones, ur: NEGATIVE mg/dL
Nitrite: NEGATIVE
Protein, ur: NEGATIVE mg/dL
Specific Gravity, Urine: 1.03 (ref 1.005–1.030)
pH: 5 (ref 5.0–8.0)

## 2018-05-07 LAB — COMPREHENSIVE METABOLIC PANEL
ALT: 17 U/L (ref 0–44)
AST: 15 U/L (ref 15–41)
Albumin: 3.5 g/dL (ref 3.5–5.0)
Alkaline Phosphatase: 94 U/L (ref 38–126)
Anion gap: 9 (ref 5–15)
BUN: 19 mg/dL (ref 8–23)
CO2: 26 mmol/L (ref 22–32)
Calcium: 8.9 mg/dL (ref 8.9–10.3)
Chloride: 104 mmol/L (ref 98–111)
Creatinine, Ser: 0.75 mg/dL (ref 0.44–1.00)
GFR calc Af Amer: >60 mL/min (ref >60)
GFR calc non Af Amer: >60 mL/min (ref >60)
Glucose, Bld: 184 mg/dL — ABNORMAL HIGH (ref 70–99)
Potassium: 3.4 mmol/L — ABNORMAL LOW (ref 3.5–5.1)
Sodium: 139 mmol/L (ref 135–145)
Total Bilirubin: 0.6 mg/dL (ref 0.3–1.2)
Total Protein: 6.7 g/dL (ref 6.5–8.1)

## 2018-05-07 LAB — TYPE AND SCREEN
ABO/RH(D): O POS
Antibody Screen: NEGATIVE

## 2018-05-07 LAB — CBC WITH DIFFERENTIAL/PLATELET
Abs Immature Granulocytes: 0 10*3/uL (ref 0.0–0.1)
Basophils Absolute: 0.1 10*3/uL (ref 0.0–0.1)
Basophils Relative: 1 %
Eosinophils Absolute: 0.1 10*3/uL (ref 0.0–0.7)
Eosinophils Relative: 1 %
HCT: 37.8 % (ref 36.0–46.0)
Hemoglobin: 12.1 g/dL (ref 12.0–15.0)
Immature Granulocytes: 0 %
Lymphocytes Relative: 25 %
Lymphs Abs: 2.2 10*3/uL (ref 0.7–4.0)
MCH: 28.7 pg (ref 26.0–34.0)
MCHC: 32 g/dL (ref 30.0–36.0)
MCV: 89.6 fL (ref 78.0–100.0)
Monocytes Absolute: 1 10*3/uL (ref 0.1–1.0)
Monocytes Relative: 11 %
Neutro Abs: 5.5 10*3/uL (ref 1.7–7.7)
Neutrophils Relative %: 62 %
Platelets: 217 10*3/uL (ref 150–400)
RBC: 4.22 MIL/uL (ref 3.87–5.11)
RDW: 13.5 % (ref 11.5–15.5)
WBC: 8.7 10*3/uL (ref 4.0–10.5)

## 2018-05-07 LAB — APTT: aPTT: 27 seconds (ref 24–36)

## 2018-05-07 LAB — PROTIME-INR
INR: 0.97
Prothrombin Time: 12.8 seconds (ref 11.4–15.2)

## 2018-05-07 LAB — HEMOGLOBIN A1C
Hgb A1c MFr Bld: 9.4 % — ABNORMAL HIGH (ref 4.8–5.6)
Mean Plasma Glucose: 223.08 mg/dL

## 2018-05-07 LAB — GLUCOSE, CAPILLARY
Glucose-Capillary: 170 mg/dL — ABNORMAL HIGH (ref 70–99)
Glucose-Capillary: 287 mg/dL — ABNORMAL HIGH (ref 70–99)
Glucose-Capillary: 274 mg/dL — ABNORMAL HIGH (ref 70–99)

## 2018-05-07 SURGERY — ANTERIOR CERVICAL DECOMPRESSION/DISCECTOMY FUSION 4 LEVELS
Anesthesia: General | Site: Spine Cervical

## 2018-05-07 MED ORDER — BUPIVACAINE-EPINEPHRINE (PF) 0.25% -1:200000 IJ SOLN
INTRAMUSCULAR | Status: AC
  Filled 2018-05-07: qty 30

## 2018-05-07 MED ORDER — DEXAMETHASONE SODIUM PHOSPHATE 10 MG/ML IJ SOLN
INTRAMUSCULAR | Status: AC
  Filled 2018-05-07: qty 1

## 2018-05-07 MED ORDER — IRBESARTAN 300 MG PO TABS
300.0000 mg | ORAL_TABLET | Freq: Every day | ORAL | Status: DC
  Administered 2018-05-08: 300 mg via ORAL
  Filled 2018-05-07: qty 1

## 2018-05-07 MED ORDER — ALUM & MAG HYDROXIDE-SIMETH 200-200-20 MG/5ML PO SUSP: 30.0000 mL | Freq: Four times a day (QID) | ORAL | Status: DC | PRN

## 2018-05-07 MED ORDER — MIDAZOLAM HCL 2 MG/2ML IJ SOLN
INTRAMUSCULAR | Status: AC
  Filled 2018-05-07: qty 2

## 2018-05-07 MED ORDER — ONDANSETRON HCL 4 MG/2ML IJ SOLN: 4.0000 mg | Freq: Four times a day (QID) | INTRAMUSCULAR | Status: DC | PRN

## 2018-05-07 MED ORDER — FENTANYL CITRATE (PF) 100 MCG/2ML IJ SOLN: 25.0000 ug | INTRAMUSCULAR | Status: DC | PRN

## 2018-05-07 MED ORDER — LIDOCAINE 2% (20 MG/ML) 5 ML SYRINGE
INTRAMUSCULAR | Status: DC | PRN
  Administered 2018-05-07: 60 mg via INTRAVENOUS

## 2018-05-07 MED ORDER — POVIDONE-IODINE 7.5 % EX SOLN
Freq: Once | CUTANEOUS | Status: DC
  Filled 2018-05-07: qty 118

## 2018-05-07 MED ORDER — PROPOFOL 10 MG/ML IV BOLUS
INTRAVENOUS | Status: AC
  Filled 2018-05-07: qty 20

## 2018-05-07 MED ORDER — OXYCODONE-ACETAMINOPHEN 5-325 MG PO TABS
1.0000 | ORAL_TABLET | ORAL | Status: DC | PRN
  Administered 2018-05-07 – 2018-05-08 (×5): 1 via ORAL
  Filled 2018-05-07 (×5): qty 1

## 2018-05-07 MED ORDER — LACTATED RINGERS IV SOLN
INTRAVENOUS | Status: DC | PRN
  Administered 2018-05-07 (×2): via INTRAVENOUS

## 2018-05-07 MED ORDER — PHENOL 1.4 % MT LIQD: 1.0000 | OROMUCOSAL | Status: DC | PRN

## 2018-05-07 MED ORDER — DIAZEPAM 5 MG PO TABS
5.0000 mg | ORAL_TABLET | Freq: Four times a day (QID) | ORAL | Status: DC | PRN
  Administered 2018-05-07 (×2): 5 mg via ORAL
  Filled 2018-05-07 (×2): qty 1

## 2018-05-07 MED ORDER — OXYCODONE HCL 5 MG/5ML PO SOLN: 5.0000 mg | Freq: Once | ORAL | Status: DC | PRN

## 2018-05-07 MED ORDER — ZOLPIDEM TARTRATE 5 MG PO TABS: 5.0000 mg | ORAL_TABLET | Freq: Every evening | ORAL | Status: DC | PRN

## 2018-05-07 MED ORDER — ESTRADIOL 0.05 MG/24HR TD PTWK: 0.0500 mg | MEDICATED_PATCH | TRANSDERMAL | Status: DC

## 2018-05-07 MED ORDER — DEXAMETHASONE SODIUM PHOSPHATE 10 MG/ML IJ SOLN
INTRAMUSCULAR | Status: DC | PRN
  Administered 2018-05-07: 5 mg via INTRAVENOUS

## 2018-05-07 MED ORDER — LIDOCAINE 2% (20 MG/ML) 5 ML SYRINGE
INTRAMUSCULAR | Status: AC
  Filled 2018-05-07: qty 5

## 2018-05-07 MED ORDER — POTASSIUM CHLORIDE IN NACL 20-0.9 MEQ/L-% IV SOLN: INTRAVENOUS | Status: DC

## 2018-05-07 MED ORDER — ACETAMINOPHEN 650 MG RE SUPP: 650.0000 mg | RECTAL | Status: DC | PRN

## 2018-05-07 MED ORDER — MIDAZOLAM HCL 5 MG/5ML IJ SOLN
INTRAMUSCULAR | Status: DC | PRN
  Administered 2018-05-07: 2 mg via INTRAVENOUS

## 2018-05-07 MED ORDER — OXYCODONE HCL 5 MG PO TABS: 5.0000 mg | ORAL_TABLET | Freq: Once | ORAL | Status: DC | PRN

## 2018-05-07 MED ORDER — 0.9 % SODIUM CHLORIDE (POUR BTL) OPTIME
TOPICAL | Status: DC | PRN
  Administered 2018-05-07 (×2): 1000 mL

## 2018-05-07 MED ORDER — SODIUM CHLORIDE 0.9% FLUSH
3.0000 mL | Freq: Two times a day (BID) | INTRAVENOUS | Status: DC
  Administered 2018-05-07: 3 mL via INTRAVENOUS

## 2018-05-07 MED ORDER — ACETAMINOPHEN 325 MG PO TABS
650.0000 mg | ORAL_TABLET | ORAL | Status: DC | PRN
  Administered 2018-05-07: 650 mg via ORAL
  Filled 2018-05-07: qty 2

## 2018-05-07 MED ORDER — METFORMIN HCL 500 MG PO TABS
500.0000 mg | ORAL_TABLET | Freq: Every day | ORAL | Status: DC
  Administered 2018-05-08: 500 mg via ORAL
  Filled 2018-05-07: qty 1

## 2018-05-07 MED ORDER — ROCURONIUM BROMIDE 10 MG/ML (PF) SYRINGE
PREFILLED_SYRINGE | INTRAVENOUS | Status: DC | PRN
  Administered 2018-05-07: 20 mg via INTRAVENOUS
  Administered 2018-05-07: 50 mg via INTRAVENOUS
  Administered 2018-05-07: 10 mg via INTRAVENOUS

## 2018-05-07 MED ORDER — ALPRAZOLAM 0.5 MG PO TABS
0.5000 mg | ORAL_TABLET | Freq: Every evening | ORAL | Status: DC | PRN
  Administered 2018-05-07: 1 mg via ORAL
  Filled 2018-05-07: qty 2

## 2018-05-07 MED ORDER — METOPROLOL SUCCINATE ER 100 MG PO TB24
100.0000 mg | ORAL_TABLET | Freq: Two times a day (BID) | ORAL | Status: DC
  Administered 2018-05-07 – 2018-05-08 (×2): 100 mg via ORAL
  Filled 2018-05-07 (×2): qty 1

## 2018-05-07 MED ORDER — ACETAMINOPHEN 325 MG PO TABS: 325.0000 mg | ORAL_TABLET | ORAL | Status: DC | PRN

## 2018-05-07 MED ORDER — TAMSULOSIN HCL 0.4 MG PO CAPS
0.4000 mg | ORAL_CAPSULE | Freq: Every day | ORAL | Status: DC
  Administered 2018-05-07: 0.4 mg via ORAL
  Filled 2018-05-07: qty 1

## 2018-05-07 MED ORDER — CANAGLIFLOZIN 100 MG PO TABS
100.0000 mg | ORAL_TABLET | Freq: Every day | ORAL | Status: DC
  Administered 2018-05-08: 100 mg via ORAL
  Filled 2018-05-07: qty 1

## 2018-05-07 MED ORDER — PANTOPRAZOLE SODIUM 40 MG PO TBEC
40.0000 mg | DELAYED_RELEASE_TABLET | Freq: Every day | ORAL | Status: DC
  Administered 2018-05-07 – 2018-05-08 (×2): 40 mg via ORAL
  Filled 2018-05-07 (×2): qty 1

## 2018-05-07 MED ORDER — ATORVASTATIN CALCIUM 20 MG PO TABS
20.0000 mg | ORAL_TABLET | Freq: Every day | ORAL | Status: DC
  Administered 2018-05-08: 20 mg via ORAL
  Filled 2018-05-07: qty 1

## 2018-05-07 MED ORDER — ACETAMINOPHEN 160 MG/5ML PO SOLN: 325.0000 mg | ORAL | Status: DC | PRN

## 2018-05-07 MED ORDER — ROCURONIUM BROMIDE 50 MG/5ML IV SOSY
PREFILLED_SYRINGE | INTRAVENOUS | Status: AC
  Filled 2018-05-07: qty 5

## 2018-05-07 MED ORDER — B COMPLEX-C PO TABS
1.0000 | ORAL_TABLET | Freq: Every day | ORAL | Status: DC
  Administered 2018-05-08: 1 via ORAL
  Filled 2018-05-07: qty 1

## 2018-05-07 MED ORDER — PROPOFOL 10 MG/ML IV BOLUS
INTRAVENOUS | Status: DC | PRN
  Administered 2018-05-07: 150 mg via INTRAVENOUS

## 2018-05-07 MED ORDER — BUPIVACAINE-EPINEPHRINE 0.25% -1:200000 IJ SOLN
INTRAMUSCULAR | Status: DC | PRN
  Administered 2018-05-07: 8 mL

## 2018-05-07 MED ORDER — ACETAMINOPHEN 500 MG PO TABS: 500.0000 mg | ORAL_TABLET | Freq: Four times a day (QID) | ORAL | Status: DC | PRN

## 2018-05-07 MED ORDER — THROMBIN 20000 UNITS EX KIT
PACK | CUTANEOUS | Status: DC | PRN
  Administered 2018-05-07: 20 mL via TOPICAL

## 2018-05-07 MED ORDER — MENTHOL 3 MG MT LOZG
1.0000 | LOZENGE | OROMUCOSAL | Status: DC | PRN
  Administered 2018-05-07: 3 mg via ORAL
  Filled 2018-05-07: qty 9

## 2018-05-07 MED ORDER — PREGABALIN 50 MG PO CAPS
50.0000 mg | ORAL_CAPSULE | Freq: Two times a day (BID) | ORAL | Status: DC
  Administered 2018-05-07 – 2018-05-08 (×2): 50 mg via ORAL
  Filled 2018-05-07 (×2): qty 1

## 2018-05-07 MED ORDER — VENLAFAXINE HCL ER 75 MG PO CP24
150.0000 mg | ORAL_CAPSULE | Freq: Two times a day (BID) | ORAL | Status: DC
  Administered 2018-05-07 – 2018-05-08 (×2): 150 mg via ORAL
  Filled 2018-05-07: qty 2

## 2018-05-07 MED ORDER — ONDANSETRON HCL 4 MG/2ML IJ SOLN
INTRAMUSCULAR | Status: DC | PRN
  Administered 2018-05-07: 4 mg via INTRAVENOUS

## 2018-05-07 MED ORDER — CEFAZOLIN SODIUM-DEXTROSE 1-4 GM/50ML-% IV SOLN
1.0000 g | Freq: Three times a day (TID) | INTRAVENOUS | Status: AC
  Administered 2018-05-07 (×2): 1 g via INTRAVENOUS
  Filled 2018-05-07 (×2): qty 50

## 2018-05-07 MED ORDER — SODIUM CHLORIDE 0.9 % IV SOLN
INTRAVENOUS | Status: DC | PRN
  Administered 2018-05-07: 20 ug/min via INTRAVENOUS

## 2018-05-07 MED ORDER — ONDANSETRON HCL 4 MG/2ML IJ SOLN
INTRAMUSCULAR | Status: AC
  Filled 2018-05-07: qty 2

## 2018-05-07 MED ORDER — ONDANSETRON HCL 4 MG/2ML IJ SOLN: 4.0000 mg | Freq: Once | INTRAMUSCULAR | Status: DC | PRN

## 2018-05-07 MED ORDER — FENTANYL CITRATE (PF) 250 MCG/5ML IJ SOLN
INTRAMUSCULAR | Status: AC
  Filled 2018-05-07: qty 5

## 2018-05-07 MED ORDER — FENTANYL CITRATE (PF) 100 MCG/2ML IJ SOLN
INTRAMUSCULAR | Status: DC | PRN
  Administered 2018-05-07: 100 ug via INTRAVENOUS
  Administered 2018-05-07: 50 ug via INTRAVENOUS

## 2018-05-07 MED ORDER — CEFAZOLIN SODIUM-DEXTROSE 2-4 GM/100ML-% IV SOLN
2.0000 g | INTRAVENOUS | Status: AC
  Administered 2018-05-07: 2 g via INTRAVENOUS

## 2018-05-07 MED ORDER — SODIUM CHLORIDE 0.9 % IV SOLN: 250.0000 mL | INTRAVENOUS | Status: DC

## 2018-05-07 MED ORDER — SUGAMMADEX SODIUM 200 MG/2ML IV SOLN
INTRAVENOUS | Status: DC | PRN
  Administered 2018-05-07: 175 mg via INTRAVENOUS

## 2018-05-07 MED ORDER — BUPROPION HCL ER (XL) 300 MG PO TB24
300.0000 mg | ORAL_TABLET | Freq: Every day | ORAL | Status: DC
  Administered 2018-05-08: 300 mg via ORAL
  Filled 2018-05-07: qty 1

## 2018-05-07 MED ORDER — ONDANSETRON HCL 4 MG PO TABS: 4.0000 mg | ORAL_TABLET | Freq: Four times a day (QID) | ORAL | Status: DC | PRN

## 2018-05-07 MED ORDER — SODIUM CHLORIDE 0.9% FLUSH: 3.0000 mL | INTRAVENOUS | Status: DC | PRN

## 2018-05-07 MED ORDER — DULAGLUTIDE 1.5 MG/0.5ML ~~LOC~~ SOAJ: 1.5000 mg | SUBCUTANEOUS | Status: DC

## 2018-05-07 MED ORDER — INSULIN GLARGINE 100 UNIT/ML ~~LOC~~ SOLN
25.0000 [IU] | Freq: Every day | SUBCUTANEOUS | Status: DC
  Administered 2018-05-08: 25 [IU] via SUBCUTANEOUS
  Filled 2018-05-07: qty 0.25

## 2018-05-07 MED ORDER — THROMBIN (RECOMBINANT) 20000 UNITS EX SOLR
CUTANEOUS | Status: AC
  Filled 2018-05-07: qty 20000

## 2018-05-07 MED ORDER — SUMATRIPTAN SUCCINATE 50 MG PO TABS
50.0000 mg | ORAL_TABLET | ORAL | Status: DC | PRN
  Filled 2018-05-07: qty 1

## 2018-05-07 MED ORDER — MEPERIDINE HCL 50 MG/ML IJ SOLN: 6.2500 mg | INTRAMUSCULAR | Status: DC | PRN

## 2018-05-07 MED ORDER — CLOPIDOGREL BISULFATE 75 MG PO TABS
75.0000 mg | ORAL_TABLET | Freq: Every day | ORAL | Status: DC
  Administered 2018-05-08: 75 mg via ORAL
  Filled 2018-05-07: qty 1

## 2018-05-07 SURGICAL SUPPLY — 77 items
APL SKNCLS STERI-STRIP NONHPOA (GAUZE/BANDAGES/DRESSINGS) ×1
BENZOIN TINCTURE PRP APPL 2/3 (GAUZE/BANDAGES/DRESSINGS) ×2 IMPLANT
BIT DRILL NEURO 2X3.1 SFT TUCH (MISCELLANEOUS) ×1 IMPLANT
BIT DRILL SRG 14X2.2XFLT CHK (BIT) IMPLANT
BIT DRL SRG 14X2.2XFLT CHK (BIT) ×1
BLADE CLIPPER SURG (BLADE) ×2 IMPLANT
BLADE SURG 15 STRL LF DISP TIS (BLADE) ×1 IMPLANT
BLADE SURG 15 STRL SS (BLADE) ×2
BONE VIVIGEN FORMABLE 1.3CC (Bone Implant) ×4 IMPLANT
BUR MATCHSTICK NEURO 3.0 LAGG (BURR) IMPLANT
CARTRIDGE OIL MAESTRO DRILL (MISCELLANEOUS) ×1 IMPLANT
COVER SURGICAL LIGHT HANDLE (MISCELLANEOUS) ×2 IMPLANT
CRADLE DONUT ADULT HEAD (MISCELLANEOUS) ×2 IMPLANT
DECANTER SPIKE VIAL GLASS SM (MISCELLANEOUS) ×2 IMPLANT
DIFFUSER DRILL AIR PNEUMATIC (MISCELLANEOUS) ×2 IMPLANT
DRAIN JACKSON RD 7FR 3/32 (WOUND CARE) IMPLANT
DRAPE C-ARM 42X72 X-RAY (DRAPES) ×2 IMPLANT
DRAPE POUCH INSTRU U-SHP 10X18 (DRAPES) ×2 IMPLANT
DRAPE SURG 17X23 STRL (DRAPES) ×6 IMPLANT
DRILL BIT SKYLINE 14MM (BIT) ×2
DRILL NEURO 2X3.1 SOFT TOUCH (MISCELLANEOUS) ×2
DURAPREP 26ML APPLICATOR (WOUND CARE) ×2 IMPLANT
ELECT COATED BLADE 2.86 ST (ELECTRODE) ×2 IMPLANT
ELECT REM PT RETURN 9FT ADLT (ELECTROSURGICAL) ×2
ELECTRODE REM PT RTRN 9FT ADLT (ELECTROSURGICAL) ×1 IMPLANT
EVACUATOR SILICONE 100CC (DRAIN) IMPLANT
GAUZE 4X4 16PLY RFD (DISPOSABLE) ×2 IMPLANT
GAUZE SPONGE 4X4 12PLY STRL (GAUZE/BANDAGES/DRESSINGS) ×2 IMPLANT
GLOVE BIO SURGEON STRL SZ7 (GLOVE) ×3 IMPLANT
GLOVE BIO SURGEON STRL SZ8 (GLOVE) ×2 IMPLANT
GLOVE BIOGEL PI IND STRL 7.0 (GLOVE) ×2 IMPLANT
GLOVE BIOGEL PI IND STRL 8 (GLOVE) ×1 IMPLANT
GLOVE BIOGEL PI INDICATOR 7.0 (GLOVE) ×3
GLOVE BIOGEL PI INDICATOR 8 (GLOVE) ×1
GOWN STRL REUS W/ TWL LRG LVL3 (GOWN DISPOSABLE) ×1 IMPLANT
GOWN STRL REUS W/ TWL XL LVL3 (GOWN DISPOSABLE) ×1 IMPLANT
GOWN STRL REUS W/TWL LRG LVL3 (GOWN DISPOSABLE) ×6
GOWN STRL REUS W/TWL XL LVL3 (GOWN DISPOSABLE) ×2
GRAFT BNE MATRIX VG FRMBL SM 1 (Bone Implant) IMPLANT
IMPL S ENDOSKEL TC 8MM ODEG (Orthopedic Implant) IMPLANT
IMPLANT S ENDOSKEL TC 8MM ODEG (Orthopedic Implant) ×2 IMPLANT
INTERLOCK LRDTC CRVCL VBR 6MM (Bone Implant) IMPLANT
INTERLOCK LRDTC CRVCL VBR 7MM (Bone Implant) IMPLANT
IV CATH 14GX2 1/4 (CATHETERS) ×2 IMPLANT
KIT BASIN OR (CUSTOM PROCEDURE TRAY) ×2 IMPLANT
KIT TURNOVER KIT B (KITS) ×2 IMPLANT
LORDOTIC CERVICAL VBR 6MM SM (Bone Implant) ×2 IMPLANT
LORDOTIC CERVICAL VBR 7MM SM (Bone Implant) ×2 IMPLANT
MANIFOLD NEPTUNE II (INSTRUMENTS) ×2 IMPLANT
NDL PRECISIONGLIDE 27X1.5 (NEEDLE) ×1 IMPLANT
NDL SPNL 20GX3.5 QUINCKE YW (NEEDLE) ×1 IMPLANT
NEEDLE PRECISIONGLIDE 27X1.5 (NEEDLE) ×2 IMPLANT
NEEDLE SPNL 20GX3.5 QUINCKE YW (NEEDLE) ×2 IMPLANT
NS IRRIG 1000ML POUR BTL (IV SOLUTION) ×2 IMPLANT
OIL CARTRIDGE MAESTRO DRILL (MISCELLANEOUS) ×2
PACK ORTHO CERVICAL (CUSTOM PROCEDURE TRAY) ×2 IMPLANT
PAD ARMBOARD 7.5X6 YLW CONV (MISCELLANEOUS) ×4 IMPLANT
PATTIES SURGICAL .5 X.5 (GAUZE/BANDAGES/DRESSINGS) IMPLANT
PATTIES SURGICAL .5 X1 (DISPOSABLE) IMPLANT
PIN DISTRACTION 14 (PIN) ×2 IMPLANT
PLATE SKYLINE 3LVL 45MM CERV (Plate) ×1 IMPLANT
SCREW SKYLINE VAR OS 14MM (Screw) ×8 IMPLANT
SPONGE INTESTINAL PEANUT (DISPOSABLE) ×4 IMPLANT
SPONGE SURGIFOAM ABS GEL 100 (HEMOSTASIS) ×2 IMPLANT
STRIP CLOSURE SKIN 1/2X4 (GAUZE/BANDAGES/DRESSINGS) ×2 IMPLANT
SURGIFLO W/THROMBIN 8M KIT (HEMOSTASIS) IMPLANT
SUT MNCRL AB 4-0 PS2 18 (SUTURE) ×2 IMPLANT
SUT VIC AB 2-0 CT2 18 VCP726D (SUTURE) ×2 IMPLANT
SYR BULB IRRIGATION 50ML (SYRINGE) ×2 IMPLANT
SYR CONTROL 10ML LL (SYRINGE) ×4 IMPLANT
TAPE CLOTH 4X10 WHT NS (GAUZE/BANDAGES/DRESSINGS) ×2 IMPLANT
TAPE CLOTH SURG 4X10 WHT LF (GAUZE/BANDAGES/DRESSINGS) ×1 IMPLANT
TAPE UMBILICAL COTTON 1/8X30 (MISCELLANEOUS) ×2 IMPLANT
TOWEL OR 17X24 6PK STRL BLUE (TOWEL DISPOSABLE) ×2 IMPLANT
TOWEL OR 17X26 10 PK STRL BLUE (TOWEL DISPOSABLE) ×2 IMPLANT
TRAY FOLEY MTR SLVR 16FR STAT (SET/KITS/TRAYS/PACK) ×2 IMPLANT
YANKAUER SUCT BULB TIP NO VENT (SUCTIONS) ×2 IMPLANT

## 2018-05-07 NOTE — Plan of Care (Signed)
  Problem: Education: Goal: Ability to verbalize activity precautions or restrictions will improve Outcome: Progressing Goal: Knowledge of the prescribed therapeutic regimen will improve Outcome: Progressing Goal: Understanding of discharge needs will improve Outcome: Progressing   

## 2018-05-07 NOTE — Op Note (Signed)
NAME:  Alexis [name]                MEDICAL RECORD NO.:  [id number]  PHYSICIAN:  Phylliss Bob, MD      DATE OF BIRTH:  January 14, 1951  DATE OF PROCEDURE:  05/07/2018                              OPERATIVE REPORT   PREOPERATIVE DIAGNOSES: 1. Bilateral cervical radiculopathy. 2. Cervical myelopathy 3. Spinal stenosis spanning C4-C7.  POSTOPERATIVE DIAGNOSES: 1. Bilateral cervical radiculopathy. 2. Cervical myelopathy 3. Spinal stenosis spanning C4-C7.  PROCEDURE: 1. Anterior cervical decompression and fusion C4/5, C5/6, C6/7. 2. Placement of anterior instrumentation, C4-C7. 3. Insertion of interbody device x3 (Titan intervertebral spacers). 4. Intraoperative use of fluoroscopy. 5. Use of morselized allograft - ViviGen.  SURGEON:  Phylliss Bob, MD  ASSISTANT:  None  ANESTHESIA:  General endotracheal anesthesia.  COMPLICATIONS:  None.  DISPOSITION:  Stable.  ESTIMATED BLOOD LOSS:  Minimal.  INDICATIONS FOR SURGERY:  Briefly, Ms. Hovanec is a pleasant 67 year old female, who did present to me with ongoing numbness in her arms and deterioration in balance.   The patient's MRI did reveal the findings noted above.  Given the patient's ongoing and progressive symptoms and lack of improvement with appropriate treatment measures, we did discuss proceeding with the procedure noted above.  Of note, the patient was evaluated by a neurologist, who did not feel that her symptoms were secondary to any neurological medical condition.   The patient was fully aware of the risks and limitations of surgery as outlined in my preoperative note.  OPERATIVE DETAILS:  On 05/07/2018, the patient was brought to surgery and general endotracheal anesthesia was administered.  The patient was placed supine on the hospital bed. The neck was gently extended.  All bony prominences were meticulously padded.  The neck was prepped and draped in the usual sterile fashion.  At this point,  I did make a left-sided transverse incision.  The platysma was incised.  A Smith-Robinson approach was used and the anterior spine was identified. A self-retaining retractor was placed.  I then subperiosteally exposed the vertebral bodies from C4-C7.  Caspar pins were then placed into the C6 and C7 vertebral bodies and distraction was applied.  A thorough and complete C6-7 intervertebral diskectomy was performed.  The posterior longitudinal ligament was identified and entered using a nerve hook.  I then used #1 followed by #2 Kerrison to perform a thorough and complete intervertebral diskectomy.  The spinal canal was thoroughly decompressed, as was the right and left neuroforamen.  The endplates were then prepared and the appropriate-sized intervertebral spacer was then packed with ViviGen and tamped into position in the usual fashion.  The lower Caspar pin was then removed and placed into the C5 vertebral body and once again, distraction was applied across the C5-6 intervertebral space.  I then again performed a thorough and complete diskectomy, thoroughly decompressing the spinal canal and bilateral neuroforamena.  After preparing the endplates, the appropriate-sized intervertebral spacer was packed with ViviGen and tamped into position.  The lower Caspar pin was then removed and placed into the C4 vertebral body and once again, distraction was applied across the C4-5 intervertebral space.  I then again performed a thorough and complete diskectomy, thoroughly decompressing the spinal canal and bilateral neuroforamena.  After preparing the endplates, the appropriate-sized intervertebral spacer was packed with ViviGen and tamped into position.  The  Caspar pins then were removed and bone wax was placed in their place.  The appropriate-sized anterior cervical plate was placed over the anterior spine.  14 mm variable angle screws were placed, 2 in each vertebral body from C4-C7 for a total  of 8 vertebral body screws.  The screws were then locked to the plate using the Cam locking mechanism.  I was very pleased with the final fluoroscopic images.  The wound was then irrigated.  The wound was then explored for any undue bleeding and there was no bleeding noted. The wound was then closed in layers using 2-0 Vicryl, followed by 4-0 Monocryl.  Benzoin and Steri-Strips were applied, followed by sterile dressing.  All instrument counts were correct at the termination of the procedure.     Phylliss Bob, MD

## 2018-05-07 NOTE — H&P (Signed)
PREOPERATIVE H&P  Chief Complaint: Bilateral arm numbness and pain  HPI: Alexis [name] is a 67 y.o. female who presents with ongoing pain and numbness in the bilateral arms  MRI reveals spinal cord compression and nerve compression spanning C4-C7  Patient has failed multiple forms of conservative care and continues to have pain (see office notes for additional details regarding the patient's full course of treatment)  Past Medical History:  Diagnosis Date  . Anxiety   . Arthritis    "knees" (05/25/2014)  . Bursitis of knee    "both"  . Chronic back pain   . Depression   . Diabetes mellitus type II   . Diverticulitis   . Fatty liver   . Fibromyalgia   . Gastric polyp    hyperplastic  . Gastroparesis    "recently dx'd" (05/25/2014)  . GERD (gastroesophageal reflux disease)   . H/O hiatal hernia   . HYWVPXTG(626.9)    "monthly" (05/25/2014)  . Heart attack ([place]) 2014   "mild"  . Hx of gastritis   . Hyperlipidemia   . Hypertension   . Irritable bowel syndrome (IBS)   . Memory difficulties 09/18/2017  . Migraine without aura, without mention of intractable migraine without mention of status migrainosus    "related to allergies; have them in the spring and fall" (05/25/2014)  . Obesity   . Pancreatic cyst    peripancreatic cystic lesion  . Renal cyst   . Sleep apnea    "wore mask; took it off in my sleep; quit wearing it" (05/25/2014)  . Sprain of neck 03/18/2013  . Uterine cancer ([place]) dx'd 2000   surg only  . Vasculitis ([place])    "irritates my legs"  . Wears glasses    Past Surgical History:  Procedure Laterality Date  . APPENDECTOMY  ~ 1967  . BREAST CYST EXCISION Right 1990  . COLONOSCOPY    . LAPAROSCOPIC CHOLECYSTECTOMY  1990  . LEFT HEART CATHETERIZATION WITH CORONARY ANGIOGRAM N/A 05/26/2014   Procedure: LEFT HEART CATHETERIZATION WITH CORONARY ANGIOGRAM;  Surgeon: Troy Sine, MD;  Location: [facility] CATH LAB;  Service: Cardiovascular;  Laterality:  N/A;  . SINUS SURGERY WITH INSTATRAK  2000  . TUBAL LIGATION  ~ 1982  . VAGINAL HYSTERECTOMY  2000   Social History   Socioeconomic History  . Marital status: Married    Spouse name: Not on file  . Number of children: 2  . Years of education: Not on file  . Highest education level: Not on file  Occupational History  . Occupation: Retired    Fish farm manager: RETIRED  Social Needs  . Financial resource strain: Not on file  . Food insecurity:    Worry: Not on file    Inability: Not on file  . Transportation needs:    Medical: Not on file    Non-medical: Not on file  Tobacco Use  . Smoking status: Never Smoker  . Smokeless tobacco: Never Used  Substance and Sexual Activity  . Alcohol use: No  . Drug use: No  . Sexual activity: Never  Lifestyle  . Physical activity:    Days per week: Not on file    Minutes per session: Not on file  . Stress: Not on file  Relationships  . Social connections:    Talks on phone: Not on file    Gets together: Not on file    Attends religious service: Not on file    Active member of club or  organization: Not on file    Attends meetings of clubs or organizations: Not on file    Relationship status: Not on file  Other Topics Concern  . Not on file  Social History Narrative   Daily Caffeine, Coke   Family History  Problem Relation Age of Onset  . Breast cancer Sister   . Colon polyps Father   . Heart disease Father   . Colon cancer Maternal Uncle   . Ovarian cancer Maternal Aunt   . Stomach cancer Maternal Aunt   . Diabetes Maternal Aunt   . Heart disease Maternal Uncle   . Heart disease Unknown        Grandparents  . Irritable bowel syndrome Daughter    Allergies  Allergen Reactions  . Aspirin Other (See Comments)    Reaction: Vasculitis per MD  . Codeine Itching and Rash  . Naproxen Nausea Only    Reaction: Makes stomach upset/irritated.  . Sulfonamide Derivatives Itching and Rash   Prior to Admission medications   Medication  Sig Start Date End Date Taking? Authorizing Provider  acetaminophen (TYLENOL) 500 MG tablet Take 500 mg by mouth every 6 (six) hours as needed for moderate pain or headache.    Yes [provider]  ALPRAZolam Duanne Moron) 0.5 MG tablet Take 0.5-1 mg by mouth at bedtime as needed for anxiety.    Yes [provider]  atorvastatin (LIPITOR) 20 MG tablet Take 20 mg by mouth daily.   Yes [provider]  b complex vitamins tablet Take 1 tablet by mouth daily.   Yes [provider]  buPROPion (WELLBUTRIN XL) 300 MG 24 hr tablet Take 300 mg by mouth daily.   Yes [provider]  Cholecalciferol (VITAMIN D3) 1000 UNITS CAPS Take 1,000 capsules by mouth at bedtime.    Yes [provider]  clopidogrel (PLAVIX) 75 MG tablet Take 1 tablet (75 mg total) by mouth daily. 03/24/18  Yes Kathrynn Ducking, MD  Cyanocobalamin (B-12 PO) Take 1 tablet by mouth at bedtime.   Yes [provider]  Dulaglutide (TRULICITY) 1.5 WU/9.8JX SOPN Inject 1.5 mg into the skin every Wednesday.    Yes [provider]  FARXIGA 10 MG TABS tablet Take 10 mg by mouth daily before breakfast.  10/09/16  Yes [provider]  fish oil-omega-3 fatty acids 1000 MG capsule Take 1 g by mouth at bedtime.    Yes [provider]  Insulin Degludec (TRESIBA FLEXTOUCH Need[place]) Inject 25 Units into the skin daily before breakfast.    Yes [provider]  irbesartan (AVAPRO) 300 MG tablet Take 300 mg by mouth daily.    Yes [provider]  metoprolol succinate (TOPROL-XL) 50 MG 24 hr tablet Take 100 mg by mouth 2 (two) times daily. Take with or immediately following a meal.    Yes [provider]  MINIVELLE 0.025 MG/24HR Apply 1 patch topically 2 (two) times a week. 08/12/16  Yes [provider]  pregabalin (LYRICA) 50 MG capsule Take 50 mg by mouth 2 (two) times daily.   Yes [provider]  RABEprazole (ACIPHEX) 20 MG tablet Take 1  tablet (20 mg total) by mouth 2 (two) times daily. 04/22/12  Yes Lafayette Dragon, MD  rizatriptan (MAXALT) 10 MG tablet Take 10 mg by mouth as needed for migraine. May repeat in 2 hours if needed    Yes [provider]  tamsulosin (FLOMAX) 0.4 MG CAPS capsule Take 0.4 mg by mouth at  bedtime.  07/17/16  Yes [provider]  traMADol (ULTRAM) 50 MG tablet Take 100 mg by mouth 2 (two) times daily.    Yes [provider]  venlafaxine XR (EFFEXOR-XR) 150 MG 24 hr capsule Take 150 mg by mouth 2 (two) times daily.   Yes [provider]  zaleplon (SONATA) 5 MG capsule Take 5 mg by mouth at bedtime.   Yes [provider]  metFORMIN (GLUCOPHAGE) 1000 MG tablet Take 500 mg by mouth daily with breakfast.    [provider]     All other systems have been reviewed and were otherwise negative with the exception of those mentioned in the HPI and as above.  Physical Exam: Vitals:   05/07/18 0603  BP: (!) 150/56  Pulse: 69  Resp: 18  Temp: 97.8 F (36.6 C)  SpO2: 95%    There is no height or weight on file to calculate BMI.  General: Alert, no acute distress Cardiovascular: No pedal edema Respiratory: No cyanosis, no use of accessory musculature Skin: No lesions in the area of chief complaint Neurologic: Sensation intact distally Psychiatric: Patient is competent for consent with normal mood and affect Lymphatic: No axillary or cervical lymphadenopathy   Assessment/Plan: Cervical radiculopathy, cervical myelopathy Plan for Procedure(s): ANTERIOR CERVICAL DECOMPRESSION FUSION, CERVICAL 4-5,CERVICAL 5-6, CERVICAL 6-7 WITH INSTRUMENTATION AND ALLOGRAFT   Sinclair Ship, MD 05/07/2018 6:25 AM

## 2018-05-07 NOTE — Anesthesia Procedure Notes (Signed)
Procedure Name: Intubation Date/Time: 05/07/2018 7:44 AM Performed by: Jenne Campus, CRNA Pre-anesthesia Checklist: Patient identified, Emergency Drugs available, Suction available and Patient being monitored Patient Re-evaluated:Patient Re-evaluated prior to induction Oxygen Delivery Method: Circle System Utilized Preoxygenation: Pre-oxygenation with 100% oxygen Induction Type: IV induction Ventilation: Mask ventilation without difficulty Laryngoscope Size: Glidescope and 3 Grade View: Grade I Tube type: Oral Tube size: 7.5 mm Number of attempts: 1 Airway Equipment and Method: Stylet and Oral airway Placement Confirmation: ETT inserted through vocal cords under direct vision,  positive ETCO2 and breath sounds checked- equal and bilateral Secured at: 21 cm Tube secured with: Tape Dental Injury: Teeth and Oropharynx as per pre-operative assessment

## 2018-05-07 NOTE — Anesthesia Postprocedure Evaluation (Signed)
Anesthesia Post Note  Patient: DOROTHE ELMORE  Procedure(s) Performed: ANTERIOR CERVICAL DECOMPRESSION FUSION, CERVICAL 4-5,CERVICAL 5-6, CERVICAL 6-7 WITH INSTRUMENTATION AND ALLOGRAFT (N/A Spine Cervical)     Patient location during evaluation: PACU Anesthesia Type: General Level of consciousness: awake and alert Pain management: pain level controlled Vital Signs Assessment: post-procedure vital signs reviewed and stable Respiratory status: spontaneous breathing, nonlabored ventilation, respiratory function stable and patient connected to nasal cannula oxygen Cardiovascular status: blood pressure returned to baseline and stable Postop Assessment: no apparent nausea or vomiting Anesthetic complications: no    Last Vitals:  Vitals:   05/07/18 1245 05/07/18 1326  BP:  135/65  Pulse: 76 75  Resp: 12 16  Temp: 36.9 C 36.9 C  SpO2: 93% 95%    Last Pain:  Vitals:   05/07/18 1326  TempSrc: Oral  PainSc:                  Shantea Poulton

## 2018-05-07 NOTE — Transfer of Care (Signed)
Immediate Anesthesia Transfer of Care Note  Patient: Alexis [name]  Procedure(s) Performed: ANTERIOR CERVICAL DECOMPRESSION FUSION, CERVICAL 4-5,CERVICAL 5-6, CERVICAL 6-7 WITH INSTRUMENTATION AND ALLOGRAFT (N/A Spine Cervical)  Patient Location: PACU  Anesthesia Type:General  Level of Consciousness: awake, oriented and patient cooperative  Airway & Oxygen Therapy: Patient Spontanous Breathing and Patient connected to nasal cannula oxygen  Post-op Assessment: Report given to RN and Post -op Vital signs reviewed and stable  Post vital signs: Reviewed  Last Vitals:  Vitals Value Taken Time  BP 109/65 05/07/2018 11:04 AM  Temp    Pulse 77 05/07/2018 11:09 AM  Resp 20 05/07/2018 11:09 AM  SpO2 90 % 05/07/2018 11:09 AM  Vitals shown include unvalidated device data.  Last Pain:  Vitals:   05/07/18 0631  TempSrc:   PainSc: 2       Patients Stated Pain Goal: 4 (11/73/56 7014)  Complications: No apparent anesthesia complications

## 2018-05-08 DIAGNOSIS — K219 Gastro-esophageal reflux disease without esophagitis: Secondary | ICD-10-CM | POA: Diagnosis not present

## 2018-05-08 DIAGNOSIS — E1143 Type 2 diabetes mellitus with diabetic autonomic (poly)neuropathy: Secondary | ICD-10-CM | POA: Diagnosis not present

## 2018-05-08 DIAGNOSIS — G992 Myelopathy in diseases classified elsewhere: Secondary | ICD-10-CM | POA: Diagnosis not present

## 2018-05-08 DIAGNOSIS — M5412 Radiculopathy, cervical region: Secondary | ICD-10-CM | POA: Diagnosis not present

## 2018-05-08 DIAGNOSIS — M4802 Spinal stenosis, cervical region: Secondary | ICD-10-CM | POA: Diagnosis not present

## 2018-05-08 DIAGNOSIS — K3184 Gastroparesis: Secondary | ICD-10-CM | POA: Diagnosis not present

## 2018-05-08 LAB — GLUCOSE, CAPILLARY: Glucose-Capillary: 229 mg/dL — ABNORMAL HIGH (ref 70–99)

## 2018-05-08 MED FILL — Thrombin (Recombinant) For Soln 20000 Unit: CUTANEOUS | Qty: 1 | Status: AC

## 2018-05-08 NOTE — Progress Notes (Signed)
Patient is discharged from room 3C02 at this time. Alert and in stable condition. IV site d/c'd and instructions read to patient and spouse with understanding verbalized. Left unit via wheelchair with all belongings at side 

## 2018-05-08 NOTE — Progress Notes (Signed)
    Patient doing well  Denies arm pain Tolerating PO well   Physical Exam: Vitals:   05/07/18 2310 05/08/18 0333  BP: (!) 123/56 124/71  Pulse: 83 77  Resp: 18 16  Temp: 98.5 F (36.9 C) 97.9 F (36.6 C)  SpO2: 94% 96%    Neck soft/supple Dressing in place NVI  POD #1 s/p C4-7 ACDF, doing well  - encourage ambulation - Percocet for pain, Valium for muscle spasms - d/c home today with f/u in 2 weeks

## 2018-05-11 ENCOUNTER — Encounter (HOSPITAL_COMMUNITY): Payer: Self-pay | Admitting: Orthopedic Surgery

## 2018-05-18 ENCOUNTER — Ambulatory Visit: Payer: Medicare Other | Admitting: Adult Health

## 2018-05-22 ENCOUNTER — Ambulatory Visit: Payer: Medicare Other | Admitting: Podiatry

## 2018-05-22 DIAGNOSIS — M5412 Radiculopathy, cervical region: Secondary | ICD-10-CM | POA: Diagnosis not present

## 2018-06-19 DIAGNOSIS — M5412 Radiculopathy, cervical region: Secondary | ICD-10-CM | POA: Diagnosis not present

## 2018-07-23 DIAGNOSIS — I10 Essential (primary) hypertension: Secondary | ICD-10-CM | POA: Diagnosis not present

## 2018-07-23 DIAGNOSIS — E118 Type 2 diabetes mellitus with unspecified complications: Secondary | ICD-10-CM | POA: Diagnosis not present

## 2018-07-23 DIAGNOSIS — N390 Urinary tract infection, site not specified: Secondary | ICD-10-CM | POA: Diagnosis not present

## 2018-07-23 DIAGNOSIS — Z0000 Encounter for general adult medical examination without abnormal findings: Secondary | ICD-10-CM | POA: Diagnosis not present

## 2018-07-31 DIAGNOSIS — M542 Cervicalgia: Secondary | ICD-10-CM | POA: Diagnosis not present

## 2018-07-31 DIAGNOSIS — Z0000 Encounter for general adult medical examination without abnormal findings: Secondary | ICD-10-CM | POA: Diagnosis not present

## 2018-07-31 DIAGNOSIS — E789 Disorder of lipoprotein metabolism, unspecified: Secondary | ICD-10-CM | POA: Diagnosis not present

## 2018-07-31 DIAGNOSIS — M150 Primary generalized (osteo)arthritis: Secondary | ICD-10-CM | POA: Diagnosis not present

## 2018-07-31 DIAGNOSIS — I10 Essential (primary) hypertension: Secondary | ICD-10-CM | POA: Diagnosis not present

## 2018-07-31 DIAGNOSIS — E118 Type 2 diabetes mellitus with unspecified complications: Secondary | ICD-10-CM | POA: Diagnosis not present

## 2018-07-31 DIAGNOSIS — Z794 Long term (current) use of insulin: Secondary | ICD-10-CM | POA: Diagnosis not present

## 2018-07-31 DIAGNOSIS — I252 Old myocardial infarction: Secondary | ICD-10-CM | POA: Diagnosis not present

## 2018-09-09 DIAGNOSIS — E119 Type 2 diabetes mellitus without complications: Secondary | ICD-10-CM | POA: Diagnosis not present

## 2018-09-16 ENCOUNTER — Ambulatory Visit (INDEPENDENT_AMBULATORY_CARE_PROVIDER_SITE_OTHER): Payer: Medicare Other | Admitting: Neurology

## 2018-09-16 ENCOUNTER — Encounter: Payer: Self-pay | Admitting: Neurology

## 2018-09-16 VITALS — BP 140/70 | HR 65 | Ht 65.0 in | Wt 185.0 lb

## 2018-09-16 DIAGNOSIS — R4789 Other speech disturbances: Secondary | ICD-10-CM | POA: Diagnosis not present

## 2018-09-16 DIAGNOSIS — R269 Unspecified abnormalities of gait and mobility: Secondary | ICD-10-CM | POA: Diagnosis not present

## 2018-09-16 DIAGNOSIS — R413 Other amnesia: Secondary | ICD-10-CM | POA: Diagnosis not present

## 2018-09-16 DIAGNOSIS — M79601 Pain in right arm: Secondary | ICD-10-CM

## 2018-09-16 NOTE — Progress Notes (Signed)
Reason for visit: Mild memory disturbance, dizziness, neck pain, low back pain, diabetes  Alexis [name] is an 68 y.o. female  History of present illness:  Alexis [name] is a 68 year old right-handed white female with a history of diabetes who reports a mild memory issue, she has some word finding problems, some mild short-term memory issues.  She believes that this has slowly worsened over time but she has not given up any activities of daily living because of it.  She had recent surgery on her cervical spine from the C4-C7 level, there was evidence of spinal cord compression.  She has done well since surgery, her dizziness has improved some.  She has intermittent episodes of severe right arm and shoulder discomfort at certain times.  She has chronic low back pain and discomfort down the legs with walking, her legs will fatigue.  She also has episodes of feeling wobbly, unable to maintain balance, this will come and go.  Carotid Doppler studies done previously were unremarkable.  The patient is on several blood pressure medications.  She returns to this office for an evaluation.  Past Medical History:  Diagnosis Date  . Anxiety   . Arthritis    "knees" (05/25/2014)  . Bursitis of knee    "both"  . Chronic back pain   . Depression   . Diabetes mellitus type II   . Diverticulitis   . Fatty liver   . Fibromyalgia   . Gastric polyp    hyperplastic  . Gastroparesis    "recently dx'd" (05/25/2014)  . GERD (gastroesophageal reflux disease)   . H/O hiatal hernia   . PJASNKNL(976.7)    "monthly" (05/25/2014)  . Heart attack ([place]) 2014   "mild"  . Hx of gastritis   . Hyperlipidemia   . Hypertension   . Irritable bowel syndrome (IBS)   . Memory difficulties 09/18/2017  . Migraine without aura, without mention of intractable migraine without mention of status migrainosus    "related to allergies; have them in the spring and fall" (05/25/2014)  . Obesity   . Pancreatic cyst    peripancreatic cystic lesion  . Renal cyst   . Sleep apnea    "wore mask; took it off in my sleep; quit wearing it" (05/25/2014)  . Sprain of neck 03/18/2013  . Uterine cancer ([place]) dx'd 2000   surg only  . Vasculitis ([place])    "irritates my legs"  . Wears glasses     Past Surgical History:  Procedure Laterality Date  . ANTERIOR CERVICAL DECOMPRESSION/DISCECTOMY FUSION 4 LEVELS N/A 05/07/2018   Procedure: ANTERIOR CERVICAL DECOMPRESSION FUSION, CERVICAL 4-5,CERVICAL 5-6, CERVICAL 6-7 WITH INSTRUMENTATION AND ALLOGRAFT;  Surgeon: Phylliss Bob, MD;  Location: [facility];  Service: Orthopedics;  Laterality: N/A;  . APPENDECTOMY  ~ 1967  . BREAST CYST EXCISION Right 1990  . COLONOSCOPY    . LAPAROSCOPIC CHOLECYSTECTOMY  1990  . LEFT HEART CATHETERIZATION WITH CORONARY ANGIOGRAM N/A 05/26/2014   Procedure: LEFT HEART CATHETERIZATION WITH CORONARY ANGIOGRAM;  Surgeon: Troy Sine, MD;  Location: [facility] CATH LAB;  Service: Cardiovascular;  Laterality: N/A;  . SINUS SURGERY WITH INSTATRAK  2000  . TUBAL LIGATION  ~ 1982  . VAGINAL HYSTERECTOMY  2000    Family History  Problem Relation Age of Onset  . Breast cancer Sister   . Colon polyps Father   . Heart disease Father   . Colon cancer Maternal Uncle   . Ovarian cancer Maternal Aunt   . Stomach  cancer Maternal Aunt   . Diabetes Maternal Aunt   . Heart disease Maternal Uncle   . Heart disease Unknown        Grandparents  . Irritable bowel syndrome Daughter     Social history:  reports that she has never smoked. She has never used smokeless tobacco. She reports that she does not drink alcohol or use drugs.    Allergies  Allergen Reactions  . Aspirin Other (See Comments)    Reaction: Vasculitis per MD  . Codeine Itching and Rash  . Naproxen Nausea Only    Reaction: Makes stomach upset/irritated.  . Sulfonamide Derivatives Itching and Rash    Medications:  Prior to Admission medications   Medication Sig Start Date End Date  Taking? Authorizing Provider  acetaminophen (TYLENOL) 500 MG tablet Take 500 mg by mouth every 6 (six) hours as needed for moderate pain or headache.    Yes [provider]  ALPRAZolam Duanne Moron) 0.5 MG tablet Take 0.5-1 mg by mouth at bedtime as needed for anxiety.    Yes [provider]  atorvastatin (LIPITOR) 20 MG tablet Take 20 mg by mouth daily.   Yes [provider]  b complex vitamins tablet Take 1 tablet by mouth daily.   Yes [provider]  buPROPion (WELLBUTRIN XL) 300 MG 24 hr tablet Take 300 mg by mouth daily.   Yes [provider]  Cyanocobalamin (B-12 PO) Take 1 tablet by mouth at bedtime.   Yes [provider]  FARXIGA 10 MG TABS tablet Take 10 mg by mouth daily before breakfast.  10/09/16  Yes [provider]  fish oil-omega-3 fatty acids 1000 MG capsule Take 1 g by mouth at bedtime.    Yes [provider]  Insulin Degludec (TRESIBA FLEXTOUCH [place]) Inject 25 Units into the skin daily before breakfast.    Yes [provider]  irbesartan (AVAPRO) 300 MG tablet Take 300 mg by mouth daily.    Yes [provider]  metFORMIN (GLUCOPHAGE) 1000 MG tablet Take 500 mg by mouth daily with breakfast.   Yes [provider]  metoprolol succinate (TOPROL-XL) 50 MG 24 hr tablet Take 100 mg by mouth 2 (two) times daily. Take with or immediately following a meal.    Yes [provider]  MINIVELLE 0.025 MG/24HR Apply 1 patch topically 2 (two) times a week. 08/12/16  Yes [provider]  pregabalin (LYRICA) 50 MG capsule Take 50 mg by mouth 2 (two) times daily.   Yes [provider]  RABEprazole (ACIPHEX) 20 MG tablet Take 1 tablet (20 mg total) by mouth 2 (two) times daily. 04/22/12  Yes Lafayette Dragon, MD  rizatriptan (MAXALT) 10 MG tablet Take 10 mg by mouth as needed for migraine. May repeat in 2 hours if needed    Yes [provider]  tamsulosin (FLOMAX) 0.4 MG CAPS  capsule Take 0.4 mg by mouth at bedtime.  07/17/16  Yes [provider]  venlafaxine XR (EFFEXOR-XR) 150 MG 24 hr capsule Take 150 mg by mouth 2 (two) times daily.   Yes [provider]  zaleplon (SONATA) 5 MG capsule Take 5 mg by mouth at bedtime.   Yes [provider]    ROS:  Out of a complete 14 system review of symptoms, the patient complains only of the following symptoms, and all other reviewed systems are negative.  Fatigue, excessive sweating Heat intolerance, excessive thirst, excessive eating Constipation, diarrhea Restless legs, insomnia, sleep apnea, snoring,  sleep talking Memory loss, dizziness, headache, numbness, speech difficulty, weakness Swollen lymph nodes Depression, anxiety  Blood pressure 140/70, pulse 65, height 5\' 5"  (1.651 m), weight 185 lb (83.9 kg).   Blood pressure, right arm, sitting is 120/84.  Blood pressure, right arm, standing is 96 systolic.  Physical Exam  General: The patient is alert and cooperative at the time of the examination.  The patient is moderately obese.  Skin: No significant peripheral edema is noted.   Neurologic Exam  Mental status: The patient is alert and oriented x 3 at the time of the examination. The patient has apparent normal recent and remote memory, with an apparently normal attention span and concentration ability.  The Mini-Mental status examination done today shows a total score 29/30.   Cranial nerves: Facial symmetry is present. Speech is normal, no aphasia or dysarthria is noted. Extraocular movements are full. Visual fields are full.  Motor: The patient has good strength in all 4 extremities.  Sensory examination: Soft touch sensation is symmetric on the face, arms, and legs.  Coordination: The patient has good finger-nose-finger and heel-to-shin bilaterally.  Gait and station: The patient has a normal gait. Tandem gait is unsteady. Romberg is negative. No drift is seen.  Reflexes:  Deep tendon reflexes are symmetric.   Assessment/Plan:  1.  Mild memory disturbance  2.  Mild orthostatic hypotension  3.  Dizziness  4.  Chronic low back pain, leg discomfort  5.  Intermittent right arm pain  The patient appears to have episodes of dizziness, gait instability that may be associated with orthostatic hypotension.  The patient is on several blood pressure medications, the medication may need to be readjusted.  The patient has had recent cervical spine surgery, she is having some intermittent right arm pain, she has fallen on 2 occasions since the surgery.  The patient will be sent for nerve conduction studies on both legs and on the right arm, EMG on the right arm.  She will otherwise will follow-up in 6 months.  We will follow the memory issues over time.  Jill Alexanders MD 09/16/2018 10:51 AM  [facility] [address] [place], [place] [address]  Phone [phone] Fax [phone]

## 2018-10-20 ENCOUNTER — Ambulatory Visit (INDEPENDENT_AMBULATORY_CARE_PROVIDER_SITE_OTHER): Payer: Medicare Other | Admitting: Neurology

## 2018-10-20 ENCOUNTER — Encounter: Payer: Self-pay | Admitting: Neurology

## 2018-10-20 DIAGNOSIS — M79601 Pain in right arm: Secondary | ICD-10-CM

## 2018-10-20 DIAGNOSIS — E1142 Type 2 diabetes mellitus with diabetic polyneuropathy: Secondary | ICD-10-CM

## 2018-10-20 DIAGNOSIS — R269 Unspecified abnormalities of gait and mobility: Secondary | ICD-10-CM

## 2018-10-20 HISTORY — DX: Type 2 diabetes mellitus with diabetic polyneuropathy: E11.42

## 2018-10-20 NOTE — Procedures (Signed)
     HISTORY:  Alexis [name] is a 68 year old patient with a history of diabetes who reports some numbness and dysesthesias involving the right arm, she has had prior cervical spine surgery.  She reports some numbness in the feet, with vibration sensations in the lower extremities.  The patient is being evaluated for possible neuropathy or a radiculopathy.  NERVE CONDUCTION STUDIES:  Nerve conduction studies were performed on the right upper extremity. The distal motor latencies and motor amplitudes for the median and ulnar nerves were within normal limits. The nerve conduction velocities for these nerves were also normal. The sensory latencies for the median and ulnar nerves were normal. The F wave latency for the ulnar nerve was within normal limits.  Nerve conduction studies were performed on both lower extremities.  The distal motor latencies for the peroneal and posterior tibial nerves were normal bilaterally with normal motor amplitudes for the peroneal nerves bilaterally, low for the posterior tibial nerves bilaterally.  Slowing was seen for the peroneal and posterior tibial nerves bilaterally with absence of the sensory latencies for the sural and peroneal nerves bilaterally.  The F-wave latencies for the posterior tibial nerves were prolonged on the left and absent on the right.  EMG STUDIES:  EMG study was performed on the right upper extremity:  The first dorsal interosseous muscle reveals 2 to 4 K units with full recruitment. No fibrillations or positive waves were noted. The abductor pollicis brevis muscle reveals 2 to 4 K units with full recruitment. No fibrillations or positive waves were noted. The extensor indicis proprius muscle reveals 1 to 3 K units with full recruitment. No fibrillations or positive waves were noted. The pronator teres muscle reveals 2 to 3 K units with full recruitment. No fibrillations or positive waves were noted. The biceps muscle reveals 1 to 2 K  units with full recruitment. No fibrillations or positive waves were noted. The triceps muscle reveals 2 to 4 K units with full recruitment. No fibrillations or positive waves were noted. The anterior deltoid muscle reveals 2 to 3 K units with full recruitment. No fibrillations or positive waves were noted. The cervical paraspinal muscles were tested at 2 levels. No abnormalities of insertional activity were seen at either level tested. There was good relaxation.   IMPRESSION:  Nerve conduction studies done on the right upper extremity and both lower extremities shows evidence of a primarily axonal peripheral neuropathy of moderate severity, possibly related to diabetes.  The evaluation of the right upper extremity was normal with nerve conductions, EMG of the right upper extremity was unremarkable.  No evidence of a cervical radiculopathy was seen.  Jill Alexanders MD 10/20/2018 1:56 PM  [facility] [address] [place], [place] [address]  Phone [phone] Fax [phone]

## 2018-10-20 NOTE — Progress Notes (Signed)
Please refer to EMG and nerve conduction procedure note.  

## 2018-10-20 NOTE — Progress Notes (Addendum)
The patient comes in for EMG and nerve conduction study evaluation, there is evidence of a primarily axonal peripheral neuropathy of moderate severity, likely related to diabetes.  No evidence of a neuropathy is seen involving the right arm.  The patient reports some improvement in the sensory symptoms of the right arm since her last visit here.  On her next visit, we will do blood work to evaluate treatable causes of peripheral neuropathy.  We will need to follow her memory issues over time.       [place]    Nerve / Sites Muscle Latency Ref. Amplitude Ref. Rel Amp Segments Distance Velocity Ref. Area    ms ms mV mV %  cm m/s m/s mVms  R Median - APB     Wrist APB 3.6 ?4.4 7.7 ?4.0 100 Wrist - APB 7   23.2     Upper arm APB 7.8  5.7  74 Upper arm - Wrist 21 50 ?49 16.4  R Ulnar - ADM     Wrist ADM 2.6 ?3.3 11.1 ?6.0 100 Wrist - ADM 7   27.6     B.Elbow ADM 6.3  10.7  96 B.Elbow - Wrist 18 49 ?49 27.6     A.Elbow ADM 8.3  10.1  94.8 A.Elbow - B.Elbow 10 49 ?49 26.8         A.Elbow - Wrist      R Peroneal - EDB     Ankle EDB 5.4 ?6.5 2.3 ?2.0 100 Ankle - EDB 9   6.1     Fib head EDB 12.3  2.2  96.9 Fib head - Ankle 27 39 ?44 5.9     Pop fossa EDB 14.8  2.1  95.5 Pop fossa - Fib head 10 39 ?44 5.7         Pop fossa - Ankle      L Peroneal - EDB     Ankle EDB 4.6 ?6.5 2.9 ?2.0 100 Ankle - EDB 9   8.5     Fib head EDB 11.6  2.2  75.4 Fib head - Ankle 27 39 ?44 7.1     Pop fossa EDB 14.2  2.1  95.9 Pop fossa - Fib head 10 38 ?44 6.8         Pop fossa - Ankle      R Tibial - AH     Ankle AH 4.5 ?5.8 2.0 ?4.0 100 Ankle - AH 9   5.6     Pop fossa AH 14.8  1.7  86.7 Pop fossa - Ankle 35 34 ?41 7.8  L Tibial - AH     Ankle AH 4.9 ?5.8 1.8 ?4.0 100 Ankle - AH 9   6.1     Pop fossa AH 14.9  0.9  47.9 Pop fossa - Ankle 35 35 ?41 3.4                  SNC    Nerve / Sites Rec. Site Peak Lat Ref.  Amp Ref. Segments Distance    ms ms V V  cm  R Sural - Ankle (Calf)     Calf Ankle NR ?4.4 NR  ?6 Calf - Ankle 14  L Sural - Ankle (Calf)     Calf Ankle NR ?4.4 NR ?6 Calf - Ankle 14  R Superficial peroneal - Ankle     Lat leg Ankle NR ?4.4 NR ?6 Lat leg - Ankle 14  L Superficial peroneal - Ankle  Lat leg Ankle NR ?4.4 NR ?6 Lat leg - Ankle 14  R Median - Orthodromic (Dig II, Mid palm)     Dig II Wrist 3.2 ?3.4 10 ?10 Dig II - Wrist 13  R Ulnar - Orthodromic, (Dig V, Mid palm)     Dig V Wrist 2.8 ?3.1 7 ?5 Dig V - Wrist 86                 F  Wave    Nerve F Lat Ref.   ms ms  R Tibial - AH NR ?56.0  L Tibial - AH 60.6 ?56.0  R Ulnar - ADM 27.3 ?32.0

## 2019-01-20 ENCOUNTER — Other Ambulatory Visit: Payer: Self-pay

## 2019-01-20 ENCOUNTER — Encounter: Payer: Self-pay | Admitting: Family Medicine

## 2019-01-20 ENCOUNTER — Ambulatory Visit
Admission: EM | Admit: 2019-01-20 | Discharge: 2019-01-20 | Disposition: A | Discharge status: Home / Self Care | Payer: Medicare Other | Attending: Family Medicine | Admitting: Family Medicine

## 2019-01-20 DIAGNOSIS — S70261A Insect bite (nonvenomous), right hip, initial encounter: Secondary | ICD-10-CM | POA: Diagnosis not present

## 2019-01-20 DIAGNOSIS — W57XXXA Bitten or stung by nonvenomous insect and other nonvenomous arthropods, initial encounter: Secondary | ICD-10-CM | POA: Diagnosis not present

## 2019-01-20 DIAGNOSIS — Z0189 Encounter for other specified special examinations: Secondary | ICD-10-CM | POA: Diagnosis not present

## 2019-01-20 MED ORDER — DOXYCYCLINE HYCLATE 100 MG PO TABS: 100.0000 mg | ORAL_TABLET | Freq: Two times a day (BID) | ORAL | 0 refills | Status: DC

## 2019-01-20 NOTE — ED Provider Notes (Signed)
[facility]    CSN: [id number] Arrival date & time: 01/20/19  1018     History   Chief Complaint Chief Complaint  Patient presents with   Tick Removal    HPI LATRESHIA BEAUCHAINE is a 68 y.o. female.   Pt states pulled a tick off her last Tuesday 8 days ago. Red spot noted to rt hip area. Pt states she doesn't feel good, tired and achy.  She's had some chills and the red spot has gotten larger.  No definite fever or disseminated rash.  The spot is on the right hip.  Patient has not recently checked her blood sugar but plans on general check up soon.     Past Medical History:  Diagnosis Date   Anxiety    Arthritis    "knees" (05/25/2014)   Bursitis of knee    "both"   Chronic back pain    Depression    Diabetes mellitus type II    Diabetic peripheral neuropathy ([place]) 10/20/2018   Diverticulitis    Fatty liver    Fibromyalgia    Gastric polyp    hyperplastic   Gastroparesis    "recently dx'd" (05/25/2014)   GERD (gastroesophageal reflux disease)    H/O hiatal hernia    Headache(784.0)    "monthly" (05/25/2014)   Heart attack ([place]) 2014   "mild"   Hx of gastritis    Hyperlipidemia    Hypertension    Irritable bowel syndrome (IBS)    Memory difficulties 09/18/2017   Migraine without aura, without mention of intractable migraine without mention of status migrainosus    "related to allergies; have them in the spring and fall" (05/25/2014)   Obesity    Pancreatic cyst    peripancreatic cystic lesion   Renal cyst    Sleep apnea    "wore mask; took it off in my sleep; quit wearing it" (05/25/2014)   Sprain of neck 03/18/2013   Uterine cancer ([place]) dx'd 2000   surg only   Vasculitis ([place])    "irritates my legs"   Wears glasses     Patient Active Problem List   Diagnosis Date Noted   Diabetic peripheral neuropathy ([place]) 10/20/2018   Cervical myelopathy ([place]) 05/07/2018   Type 2 diabetes mellitus ([place]) 02/18/2018     Memory difficulties 09/18/2017   Coronary artery calcification seen on computed tomography 04/09/2017   Word finding difficulty 09/05/2016   Cervicogenic headache 09/05/2016   Palpitation 08/22/2016   Bilateral leg edema 08/22/2016   Obesity (BMI 30-39.9) 05/25/2014   Precordial chest pain 05/25/2014   Migraine without aura, without mention of intractable migraine without mention of status migrainosus 03/18/2013   Chest pain 01/29/2013   Constipation 02/24/2012   DM (diabetes mellitus), type 2, uncontrolled ([place]) 08/24/2010   Hyperlipidemia 08/24/2010   ANXIETY 08/24/2010   Depression 08/24/2010   Essential hypertension 08/24/2010   GERD 08/24/2010   IRRITABLE BOWEL SYNDROME 08/24/2010   FATTY LIVER DISEASE 08/24/2010   ARTHRITIS 08/24/2010   Fibromyalgia 08/24/2010   OSA (obstructive sleep apnea) 08/24/2010   ABDOMINAL PAIN-RUQ 08/24/2010   UTERINE CANCER, HX OF 08/24/2010   GASTRITIS, HX OF 08/24/2010    Past Surgical History:  Procedure Laterality Date   ANTERIOR CERVICAL DECOMPRESSION/DISCECTOMY FUSION 4 LEVELS N/A 05/07/2018   Procedure: ANTERIOR CERVICAL DECOMPRESSION FUSION, CERVICAL 4-5,CERVICAL 5-6, CERVICAL 6-7 WITH INSTRUMENTATION AND ALLOGRAFT;  Surgeon: Phylliss Bob, MD;  Location: [facility];  Service: Orthopedics;  Laterality: N/A;   APPENDECTOMY  ~ 1967  BREAST CYST EXCISION Right 1990   COLONOSCOPY     LAPAROSCOPIC CHOLECYSTECTOMY  1990   LEFT HEART CATHETERIZATION WITH CORONARY ANGIOGRAM N/A 05/26/2014   Procedure: LEFT HEART CATHETERIZATION WITH CORONARY ANGIOGRAM;  Surgeon: Troy Sine, MD;  Location: [facility] CATH LAB;  Service: Cardiovascular;  Laterality: N/A;   SINUS SURGERY WITH INSTATRAK  2000   TUBAL LIGATION  ~ 1982   VAGINAL HYSTERECTOMY  2000    OB History    Gravida      Para      Term      Preterm      AB      Living  2     SAB      TAB      Ectopic      Multiple      Live Births                Home Medications    Prior to Admission medications   Medication Sig Start Date End Date Taking? Authorizing Provider  acetaminophen (TYLENOL) 500 MG tablet Take 500 mg by mouth every 6 (six) hours as needed for moderate pain or headache.     [provider]  ALPRAZolam Duanne Moron) 0.5 MG tablet Take 0.5-1 mg by mouth at bedtime as needed for anxiety.     [provider]  atorvastatin (LIPITOR) 20 MG tablet Take 20 mg by mouth daily.    [provider]  b complex vitamins tablet Take 1 tablet by mouth daily.    [provider]  buPROPion (WELLBUTRIN XL) 300 MG 24 hr tablet Take 300 mg by mouth daily.    [provider]  Cyanocobalamin (B-12 PO) Take 1 tablet by mouth at bedtime.    [provider]  doxycycline (VIBRA-TABS) 100 MG tablet Take 1 tablet (100 mg total) by mouth 2 (two) times daily. 01/20/19   Robyn Haber, MD  FARXIGA 10 MG TABS tablet Take 10 mg by mouth daily before breakfast.  10/09/16   [provider]  fish oil-omega-3 fatty acids 1000 MG capsule Take 1 g by mouth at bedtime.     [provider]  Insulin Degludec (TRESIBA FLEXTOUCH [place]) Inject 25 Units into the skin daily before breakfast.     [provider]  irbesartan (AVAPRO) 300 MG tablet Take 300 mg by mouth daily.     [provider]  metFORMIN (GLUCOPHAGE) 1000 MG tablet Take 500 mg by mouth daily with breakfast.    [provider]  metoprolol succinate (TOPROL-XL) 50 MG 24 hr tablet Take 100 mg by mouth 2 (two) times daily. Take with or immediately following a meal.     [provider]  MINIVELLE 0.025 MG/24HR Apply 1 patch topically 2 (two) times a week. 08/12/16   [provider]  pregabalin (LYRICA) 50 MG capsule Take 50 mg by mouth 2 (two) times daily.    [provider]  RABEprazole (ACIPHEX) 20 MG tablet Take 1 tablet (20 mg total) by mouth 2 (two) times daily. 04/22/12   [place] Dragon,  MD  rizatriptan (MAXALT) 10 MG tablet Take 10 mg by mouth as needed for migraine. May repeat in 2 hours if needed     [provider]  tamsulosin (FLOMAX) 0.4 MG CAPS capsule Take 0.4 mg by mouth at bedtime.  07/17/16   [provider]  venlafaxine XR (EFFEXOR-XR) 150 MG 24 hr capsule Take 150 mg by mouth 2 (two) times daily.  [provider]  zaleplon (SONATA) 5 MG capsule Take 5 mg by mouth at bedtime.    [provider]    Family History Family History  Problem Relation Age of Onset   Breast cancer Sister    Colon polyps Father    Heart disease Father    Colon cancer Maternal Uncle    Ovarian cancer Maternal Aunt    Stomach cancer Maternal Aunt    Diabetes Maternal Aunt    Heart disease Maternal Uncle    Heart disease Other        Grandparents   Irritable bowel syndrome Daughter     Social History Social History   Tobacco Use   Smoking status: Never Smoker   Smokeless tobacco: Never Used  Substance Use Topics   Alcohol use: No   Drug use: No     Allergies   Aspirin; Codeine; Naproxen; and Sulfonamide derivatives   Review of Systems Review of Systems  Constitutional: Positive for chills and fatigue.  Musculoskeletal: Positive for myalgias.  Skin: Positive for rash.  All other systems reviewed and are negative.    Physical Exam Triage Vital Signs ED Triage Vitals  Enc Vitals Group     BP      Pulse      Resp      Temp      Temp src      SpO2      Weight      Height      Head Circumference      Peak Flow      Pain Score      Pain Loc      Pain Edu?      Excl. in [place]?    No data found.  Updated Vital Signs BP (!) 149/77 (BP Location: Left Arm)    Pulse 65    Temp 98.4 F (36.9 C) (Oral)    Resp 20    SpO2 98%    Physical Exam Vitals signs and nursing note reviewed.  Constitutional:      General: She is not in acute distress.    Appearance: Normal appearance. She is obese. She is not  ill-appearing, toxic-appearing or diaphoretic.  HENT:     Head: Normocephalic.  Eyes:     Conjunctiva/sclera: Conjunctivae normal.  Neck:     Musculoskeletal: Normal range of motion and neck supple.  Cardiovascular:     Rate and Rhythm: Normal rate.  Pulmonary:     Effort: Pulmonary effort is normal.  Musculoskeletal: Normal range of motion.  Skin:    General: Skin is warm and dry.  Neurological:     General: No focal deficit present.     Mental Status: She is alert and oriented to person, place, and time.  Psychiatric:        Mood and Affect: Mood normal.        Behavior: Behavior normal.        Thought Content: Thought content normal.        Judgment: Judgment normal.        UC Treatments / Results  Labs (all labs ordered are listed, but only abnormal results are displayed) Labs Reviewed  ROCKY MTN SPOTTED FVR ABS PNL(IGG+IGM)  [facility] ANTIBODY PANEL    EKG None  Radiology No results found.  Procedures Procedures (including critical care time)  Medications Ordered in UC Medications - No data to display  Initial Impression / Assessment and Plan / UC Course  I have  reviewed the triage vital signs and the nursing notes.  Pertinent labs & imaging results that were available during my care of the patient were reviewed by me and considered in my medical decision making (see chart for details).      Final Clinical Impressions(s) / UC Diagnoses   Final diagnoses:  Tick bite, initial encounter   Discharge Instructions   None    ED Prescriptions    Medication Sig Dispense Auth. Provider   doxycycline (VIBRA-TABS) 100 MG tablet Take 1 tablet (100 mg total) by mouth 2 (two) times daily. 20 tablet Robyn Haber, MD     Controlled Substance Prescriptions [place] Controlled Substance Registry consulted? Not Applicable   Robyn Haber, MD 01/20/19 1043

## 2019-01-20 NOTE — ED Triage Notes (Signed)
Pt states pulled a tick off her last Tuesday. Red spot noted to rt hip area. Pt states she doesn't feel good, tired and achy.

## 2019-01-22 LAB — ROCKY MTN SPOTTED FVR ABS PNL(IGG+IGM)
RMSF IgG: NEGATIVE
RMSF IgM: 0.29 index (ref 0.00–0.89)

## 2019-01-22 LAB — EHRLICHIA ANTIBODY PANEL
E. Chaffeensis (HME) IgM Titer: NEGATIVE
E.Chaffeensis (HME) IgG: NEGATIVE
HGE IgG Titer: NEGATIVE
HGE IgM Titer: NEGATIVE

## 2019-02-24 DIAGNOSIS — I10 Essential (primary) hypertension: Secondary | ICD-10-CM | POA: Diagnosis not present

## 2019-02-24 DIAGNOSIS — E118 Type 2 diabetes mellitus with unspecified complications: Secondary | ICD-10-CM | POA: Diagnosis not present

## 2019-02-24 DIAGNOSIS — E7800 Pure hypercholesterolemia, unspecified: Secondary | ICD-10-CM | POA: Diagnosis not present

## 2019-03-02 DIAGNOSIS — E7800 Pure hypercholesterolemia, unspecified: Secondary | ICD-10-CM | POA: Diagnosis not present

## 2019-03-02 DIAGNOSIS — Z7189 Other specified counseling: Secondary | ICD-10-CM | POA: Diagnosis not present

## 2019-03-02 DIAGNOSIS — I10 Essential (primary) hypertension: Secondary | ICD-10-CM | POA: Diagnosis not present

## 2019-03-02 DIAGNOSIS — I252 Old myocardial infarction: Secondary | ICD-10-CM | POA: Diagnosis not present

## 2019-03-02 DIAGNOSIS — E118 Type 2 diabetes mellitus with unspecified complications: Secondary | ICD-10-CM | POA: Diagnosis not present

## 2019-03-15 IMAGING — DX DG HAND COMPLETE 3+V*L*
3 series · 3 of 3 positions shown · non-contrast
Comparison: 10/24/2015

CLINICAL DATA: Fell yesterday in the parking mild [REDACTED],
landing on LEFT side; swelling at fourth and fifth fingers, pain
traveling down to LEFT wrist mostly posteriorly

EXAM:
LEFT HAND - COMPLETE 3+ VIEW

[hand ap]
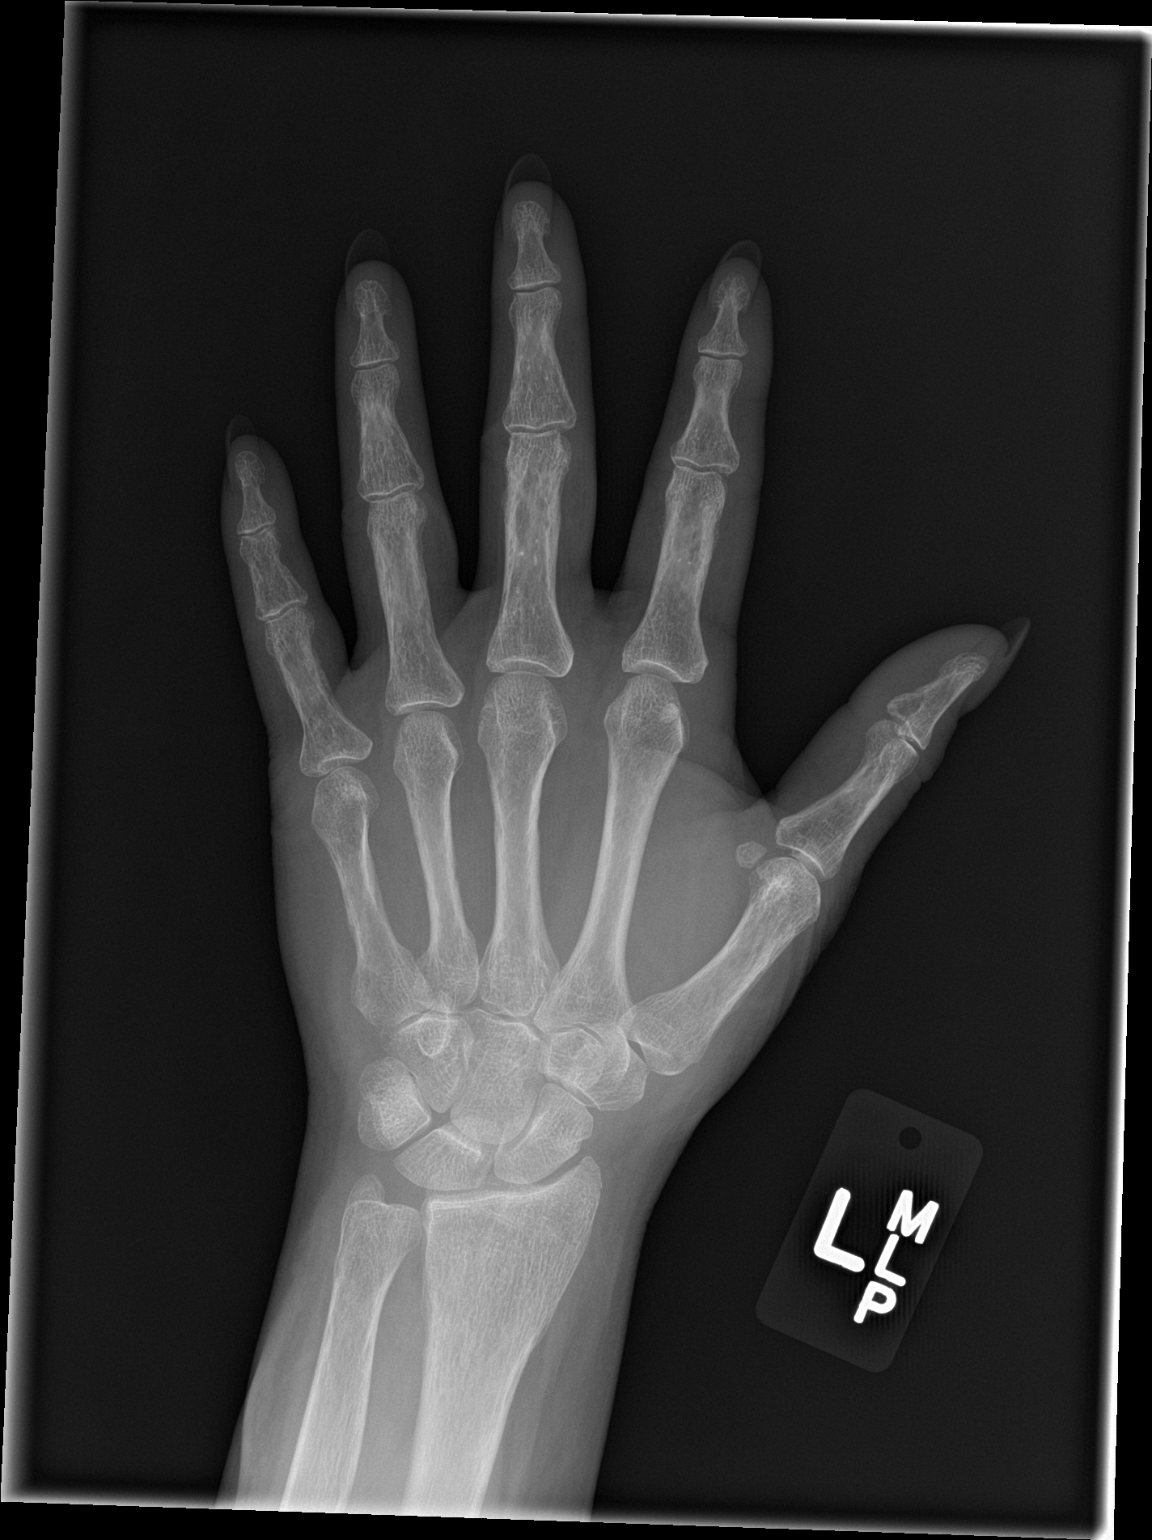

[hand obl]
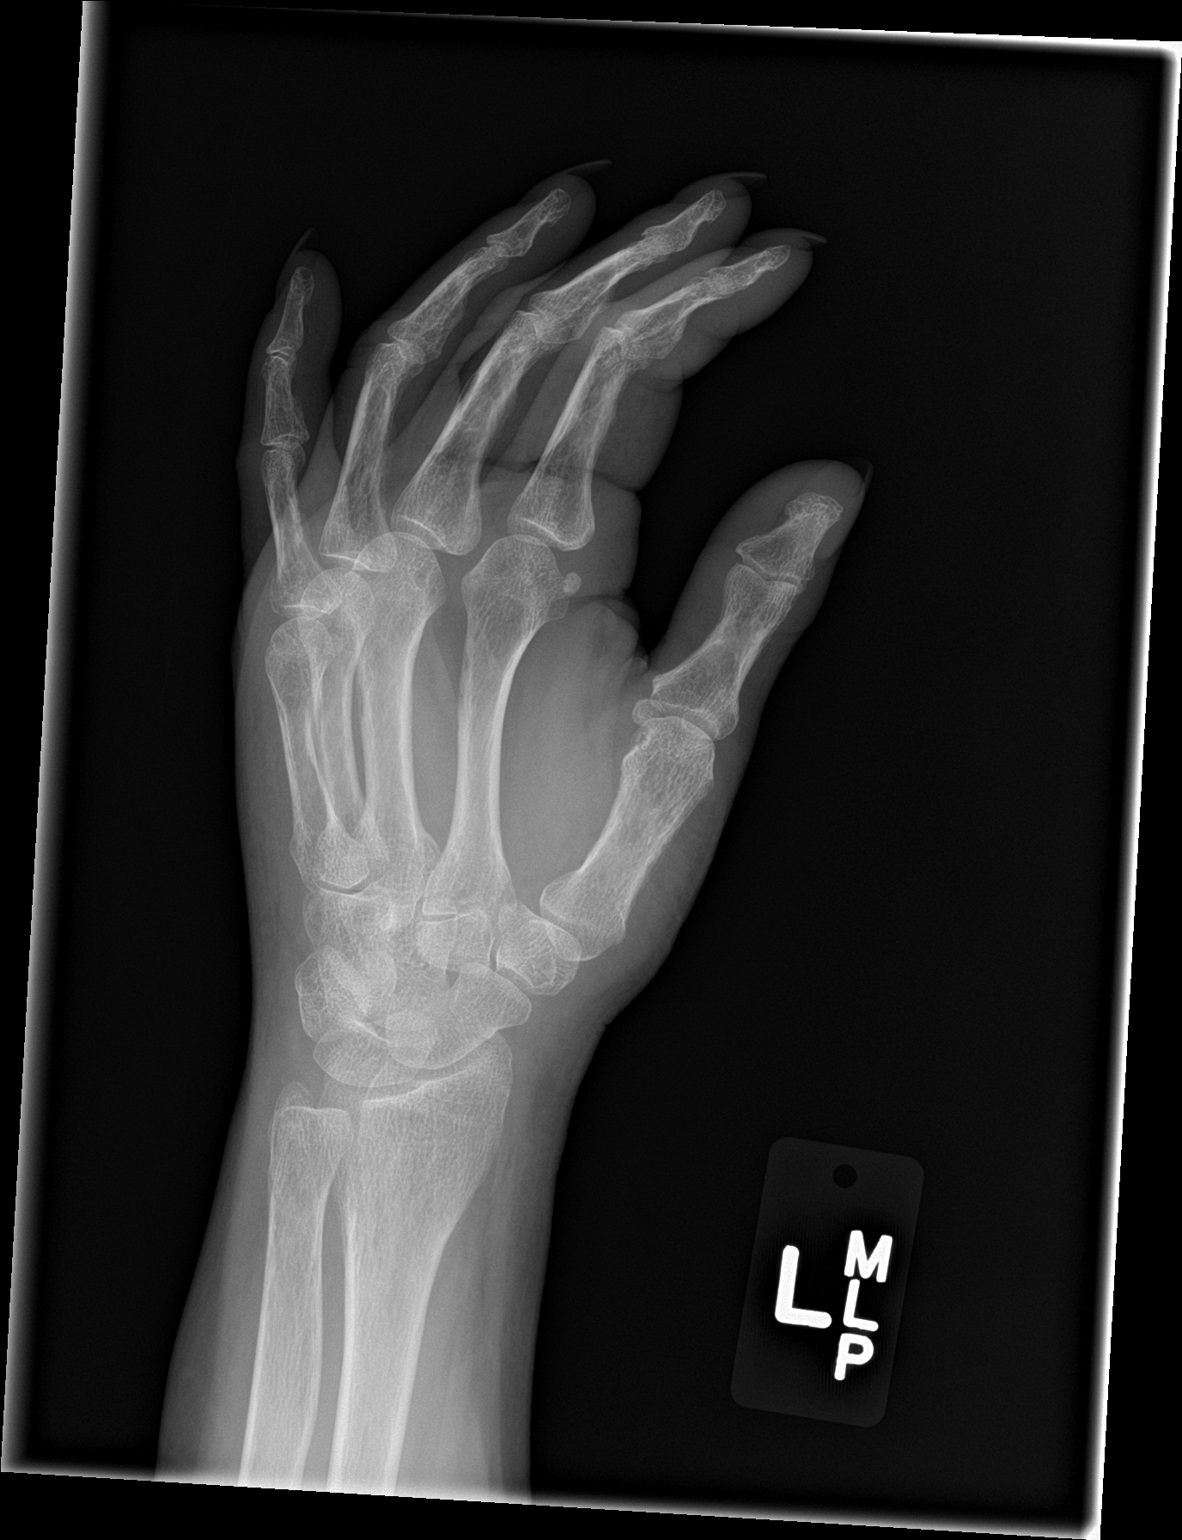

[hand lat]
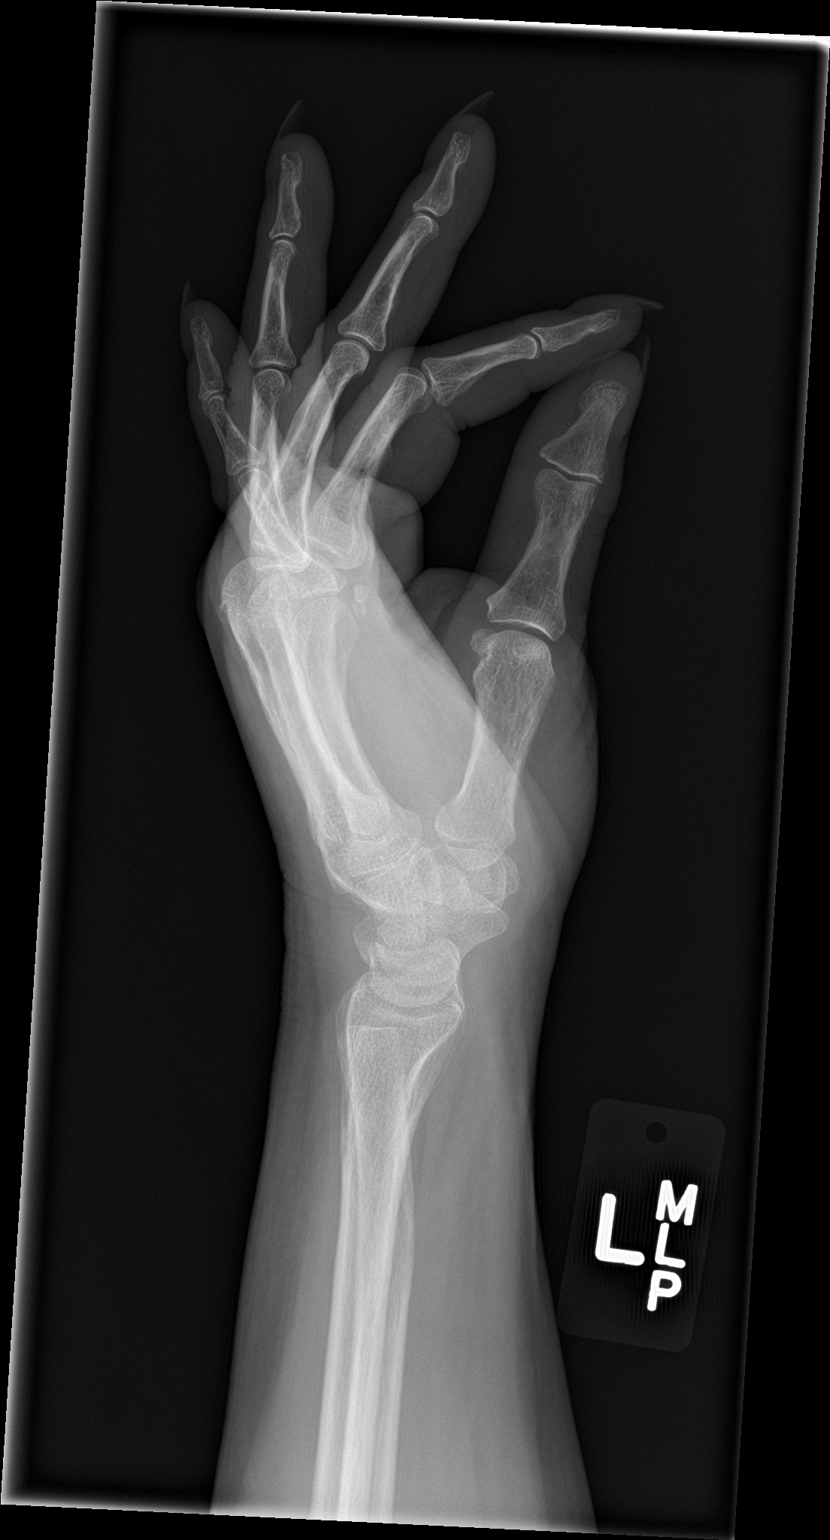

[3 of 3 positions shown; findings below may reference images not displayed]

FINDINGS: Diffuse osseous demineralization.

Joint spaces preserved.

Questionable fracture at base of proximal phalanx LEFT little finger
on oblique view, not identified on remaining views, could
potentially be extending intra-articular at the fifth MCP joint.

Soft tissue swelling overlying the MCP joints on the lateral view.

No additional fracture, dislocation or bone destruction.
IMPRESSION: Questionable nondisplaced intra-articular fracture at the base of
the proximal phalanx LEFT little finger.

## 2019-03-15 IMAGING — DX DG WRIST COMPLETE 3+V*L*
4 series · 4 of 4 positions shown · non-contrast
Comparison: None

CLINICAL DATA: Fell yesterday in the parking lot [REDACTED],
landing on LEFT side; pain in LEFT hand extending down into wrist
especially dorsally

EXAM:
LEFT WRIST - COMPLETE 3+ VIEW

[wrist ap (1 of 2)]
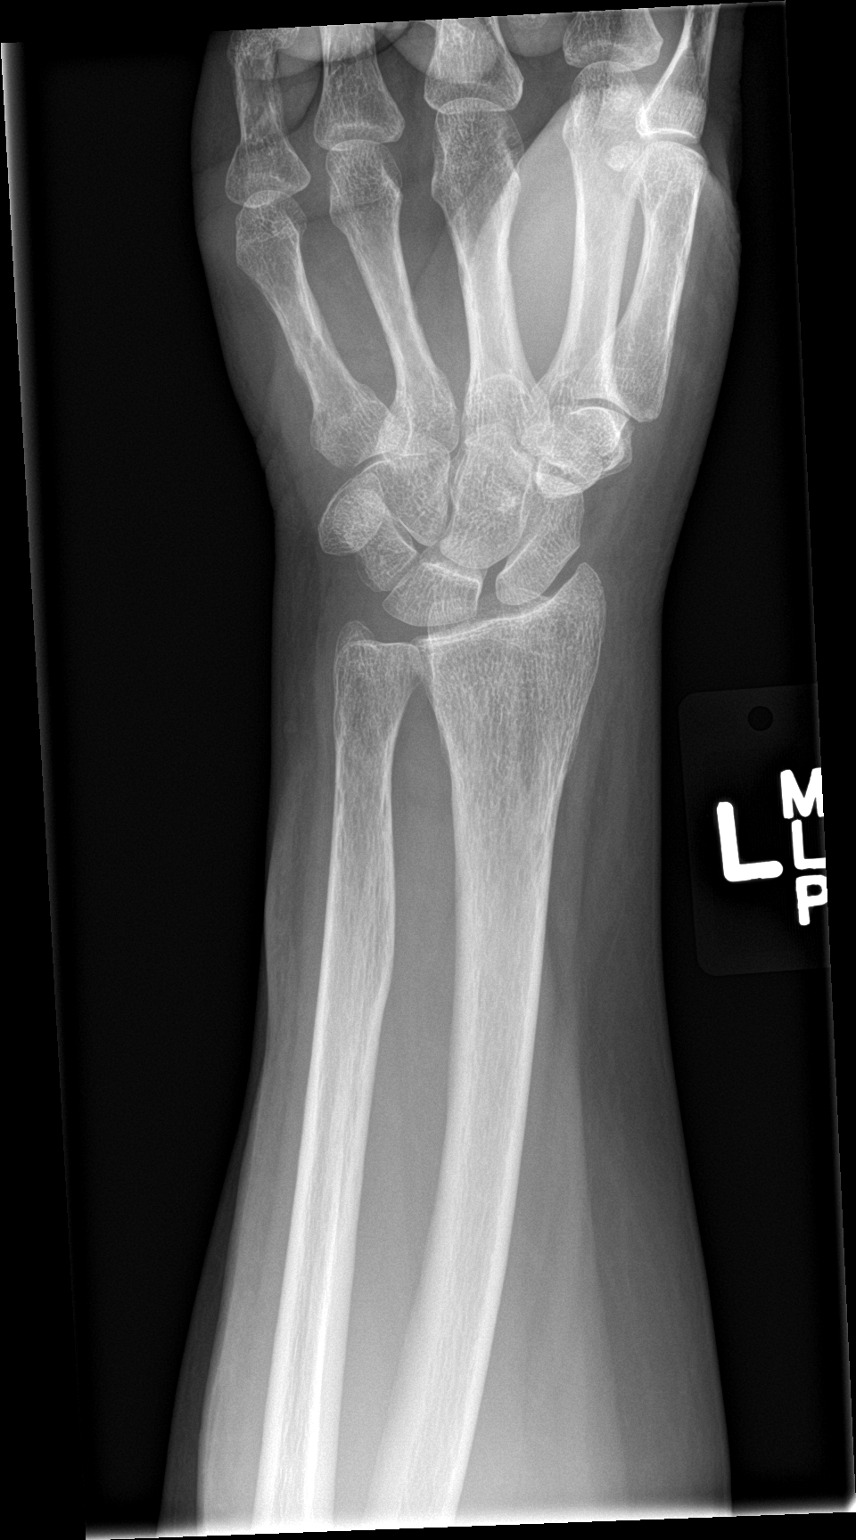

[wrist obl]
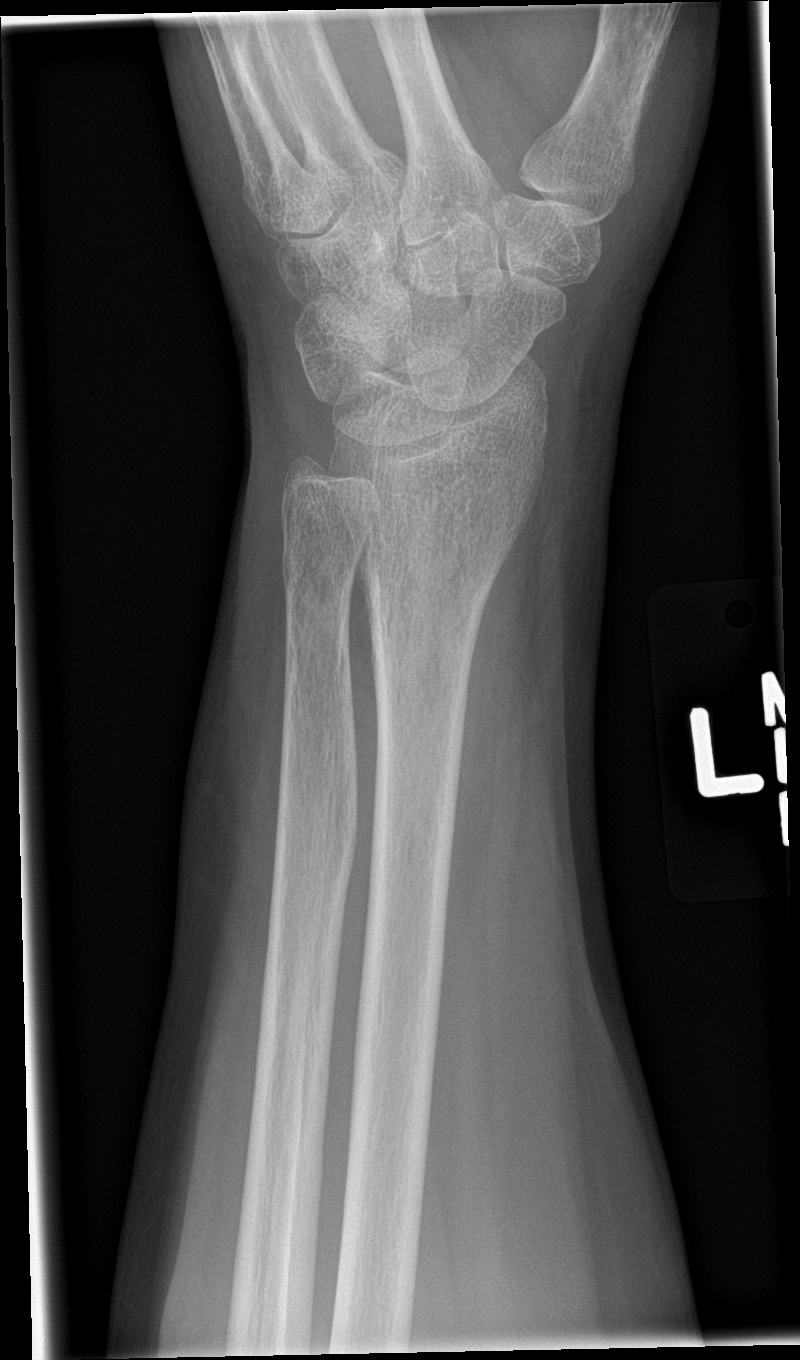

[wrist tunnel]
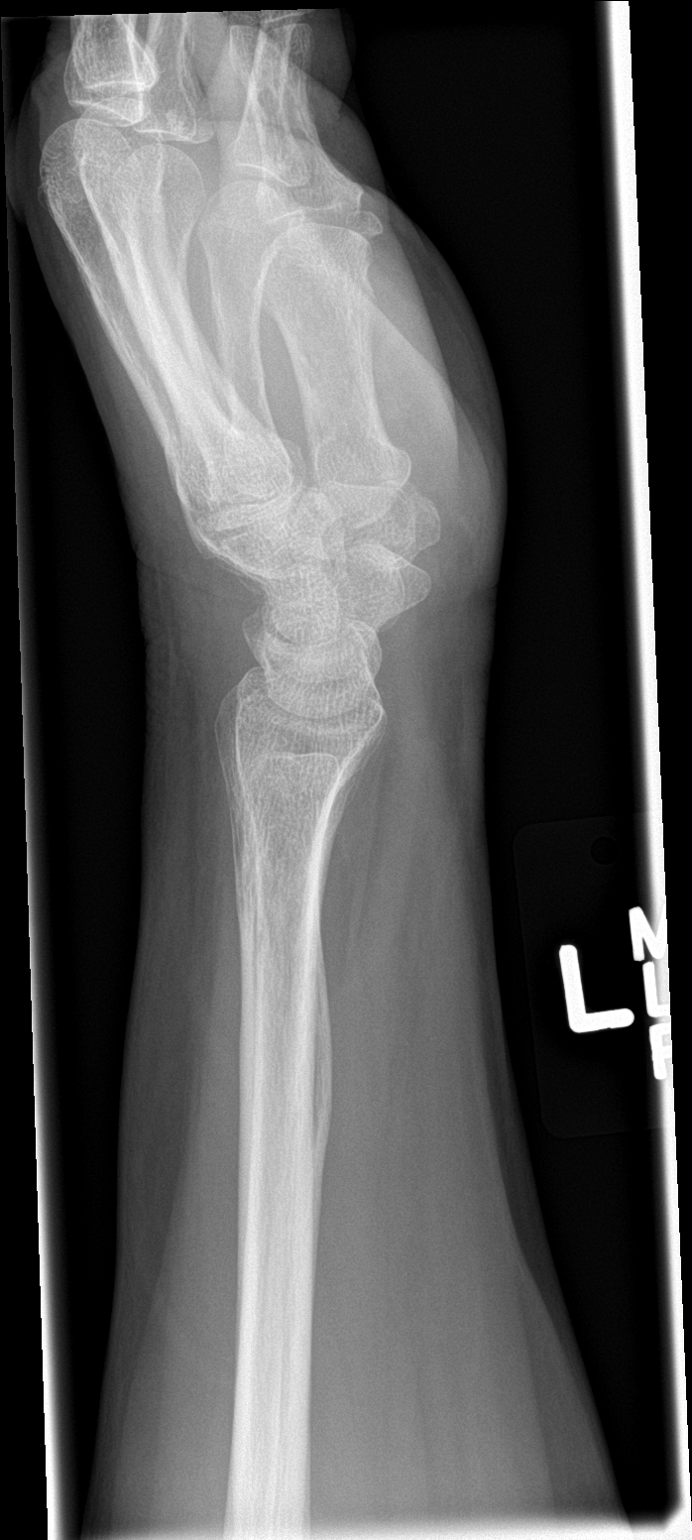

[wrist ap (2 of 2)]
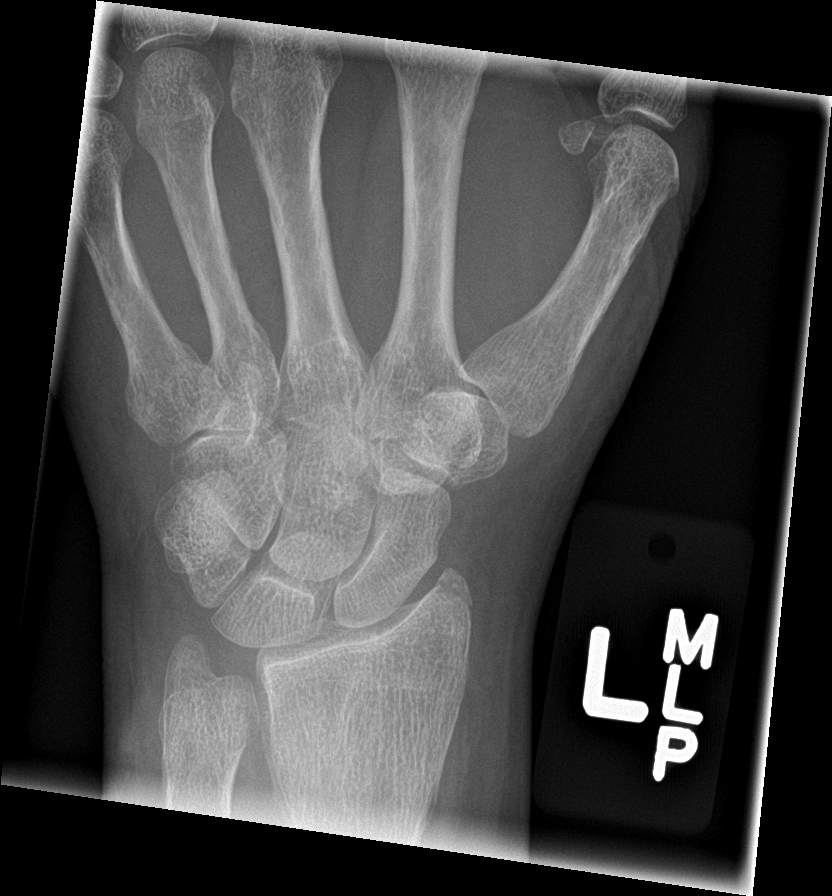

[4 of 4 positions shown; findings below may reference images not displayed]

FINDINGS: Osseous demineralization.

Joint spaces preserved.

Questionable nondisplaced fracture at the base of the proximal
phalanx LEFT little finger.

Slight widening of the scapholunate interval raising question of
scapholunate ligament tear. Additionally, a nondisplaced fracture is
seen at the tip of the radial styloid.

No dislocation or bone destruction.
IMPRESSION: Nondisplaced fracture at tip of LEFT radial styloid.

Questionable nondisplaced fracture at base of proximal phalanx LEFT
little finger.

Question torn scapholunate ligament.

Osseous demineralization.

## 2019-03-16 NOTE — Progress Notes (Signed)
PATIENT: Alexis [name] DOB: [birth year]  REASON FOR VISIT: follow up HISTORY FROM: patient  HISTORY OF PRESENT ILLNESS: Today 03/17/19  Alexis [name] is a 68 year old female with history of diabetes and mild memory issues with word finding difficulty.  Her last memory score was 29/30.  She had nerve conduction evaluation in March 2020 that showed evidence of a primarily axonal peripheral neuropathy of moderate severity, likely related to diabetes.  There is no evidence of neuropathy involving the right arm. She continues to have difficulty with word finding difficulty having conversations. She may have difficulty pronouncing a word. She had MRI of the brain in August 2019 that showed white matter changes involving the pons, that could explain her dizziness. She remains on Plavix for the white matter changes.  One time she had a sharp pain in her scalp when she stood up. She has panic attacks from PTSD. She has swelling to her legs and ankles for the last several months.  She lives with her husband. She is able to manage all her ADLs very well. Her A1C was 12 last week. Occasionally she will have tingling in her right leg. Otherwise, they don't give her trouble.  She has not had any recent fall.  She presents today for follow-up unaccompanied.  HISTORY 09/16/2018 Dr. Jannifer [name]: Alexis [name] is a 68 year old right-handed white female with a history of diabetes who reports a mild memory issue, she has some word finding problems, some mild short-term memory issues.  She believes that this has slowly worsened over time but she has not given up any activities of daily living because of it.  She had recent surgery on her cervical spine from the C4-C7 level, there was evidence of spinal cord compression.  She has done well since surgery, her dizziness has improved some.  She has intermittent episodes of severe right arm and shoulder discomfort at certain times.  She has chronic low back pain and discomfort down  the legs with walking, her legs will fatigue.  She also has episodes of feeling wobbly, unable to maintain balance, this will come and go.  Carotid Doppler studies done previously were unremarkable.  The patient is on several blood pressure medications.  She returns to this office for an evaluation.  REVIEW OF SYSTEMS: Out of a complete 14 system review of symptoms, the patient complains only of the following symptoms, and all other reviewed systems are negative.  Peripheral neuropathy, swelling in her legs, memory loss, word finding difficulty  ALLERGIES: Allergies  Allergen Reactions   Aspirin Other (See Comments)    Reaction: Vasculitis per MD   Codeine Itching and Rash   Naproxen Nausea Only    Reaction: Makes stomach upset/irritated.   Sulfonamide Derivatives Itching and Rash    HOME MEDICATIONS: Outpatient Medications Prior to Visit  Medication Sig Dispense Refill   acetaminophen (TYLENOL) 500 MG tablet Take 500 mg by mouth every 6 (six) hours as needed for moderate pain or headache.      ALPRAZolam (XANAX) 0.5 MG tablet Take 0.5-1 mg by mouth at bedtime as needed for anxiety.      ALPRAZolam (XANAX) 1 MG tablet TAKE 1/2 TABLET TWICE A DAY AS NEEDED FOR ANXIETY     atorvastatin (LIPITOR) 20 MG tablet Take 20 mg by mouth daily.     b complex vitamins tablet Take 1 tablet by mouth daily.     clopidogrel (PLAVIX) 75 MG tablet Take 75 mg by mouth daily.  Cyanocobalamin (B-12 PO) Take 1 tablet by mouth at bedtime.     estradiol (VIVELLE-DOT) 0.05 MG/24HR patch APPLY 1 PATCH(ES) TWICE A WEEK BY TRANSDERMAL ROUTE, MEDICALLY NECESSARY FOR HOT FLASHES     FARXIGA 10 MG TABS tablet Take 10 mg by mouth daily before breakfast.      fish oil-omega-3 fatty acids 1000 MG capsule Take 1 g by mouth at bedtime.      metFORMIN (GLUCOPHAGE) 1000 MG tablet Take 500 mg by mouth daily with breakfast.     metoprolol succinate (TOPROL-XL) 50 MG 24 hr tablet Take 100 mg by mouth 2 (two)  times daily. Take with or immediately following a meal.      MINIVELLE 0.025 MG/24HR Apply 1 patch topically 2 (two) times a week.     pregabalin (LYRICA) 50 MG capsule Take 50 mg by mouth 2 (two) times daily.     RABEprazole (ACIPHEX) 20 MG tablet Take 1 tablet (20 mg total) by mouth 2 (two) times daily. 60 tablet 3   rizatriptan (MAXALT) 10 MG tablet Take 10 mg by mouth as needed for migraine. May repeat in 2 hours if needed      tamsulosin (FLOMAX) 0.4 MG CAPS capsule Take 0.4 mg by mouth at bedtime.   3   venlafaxine XR (EFFEXOR-XR) 150 MG 24 hr capsule Take 150 mg by mouth 2 (two) times daily.     zaleplon (SONATA) 5 MG capsule Take 5 mg by mouth at bedtime.     buPROPion (WELLBUTRIN XL) 300 MG 24 hr tablet Take 300 mg by mouth daily.     doxycycline (VIBRA-TABS) 100 MG tablet Take 1 tablet (100 mg total) by mouth 2 (two) times daily. 20 tablet 0   Insulin Degludec (TRESIBA FLEXTOUCH [place]) Inject 25 Units into the skin daily before breakfast.      irbesartan (AVAPRO) 300 MG tablet Take 300 mg by mouth daily.      No facility-administered medications prior to visit.     PAST MEDICAL HISTORY: Past Medical History:  Diagnosis Date   Anxiety    Arthritis    "knees" (05/25/2014)   Bursitis of knee    "both"   Chronic back pain    Depression    Diabetes mellitus type II    Diabetic peripheral neuropathy ([place]) 10/20/2018   Diverticulitis    Fatty liver    Fibromyalgia    Gastric polyp    hyperplastic   Gastroparesis    "recently dx'd" (05/25/2014)   GERD (gastroesophageal reflux disease)    H/O hiatal hernia    Headache(784.0)    "monthly" (05/25/2014)   Heart attack ([place]) 2014   "mild"   Hx of gastritis    Hyperlipidemia    Hypertension    Irritable bowel syndrome (IBS)    Memory difficulties 09/18/2017   Migraine without aura, without mention of intractable migraine without mention of status migrainosus    "related to allergies; have them in  the spring and fall" (05/25/2014)   Obesity    Pancreatic cyst    peripancreatic cystic lesion   Renal cyst    Sleep apnea    "wore mask; took it off in my sleep; quit wearing it" (05/25/2014)   Sprain of neck 03/18/2013   Uterine cancer ([place]) dx'd 2000   surg only   Vasculitis ([place])    "irritates my legs"   Wears glasses     PAST SURGICAL HISTORY: Past Surgical History:  Procedure Laterality Date   ANTERIOR CERVICAL  DECOMPRESSION/DISCECTOMY FUSION 4 LEVELS N/A 05/07/2018   Procedure: ANTERIOR CERVICAL DECOMPRESSION FUSION, CERVICAL 4-5,CERVICAL 5-6, CERVICAL 6-7 WITH INSTRUMENTATION AND ALLOGRAFT;  Surgeon: Phylliss Bob, MD;  Location: [facility];  Service: Orthopedics;  Laterality: N/A;   APPENDECTOMY  ~ [place]   LEFT HEART CATHETERIZATION WITH CORONARY ANGIOGRAM N/A 05/26/2014   Procedure: LEFT HEART CATHETERIZATION WITH CORONARY ANGIOGRAM;  Surgeon: Troy Sine, MD;  Location: [facility] CATH LAB;  Service: Cardiovascular;  Laterality: N/A;   SINUS SURGERY WITH INSTATRAK  2000   TUBAL LIGATION  ~ 1982   VAGINAL HYSTERECTOMY  2000    FAMILY HISTORY: Family History  Problem Relation Age of Onset   Breast cancer Sister    Colon polyps Father    Heart disease Father    Colon cancer Maternal Uncle    Ovarian cancer Maternal Aunt    Stomach cancer Maternal Aunt    Diabetes Maternal Aunt    Heart disease Maternal Uncle    Heart disease Other        Grandparents   Irritable bowel syndrome Daughter     SOCIAL HISTORY: Social History   Socioeconomic History   Marital status: Married    Spouse name: Not on file   Number of children: 2   Years of education: Not on file   Highest education level: Not on file  Occupational History   Occupation: Retired    Fish farm manager: RETIRED  Scientist, product/process development strain: Not on file   Food insecurity    Worry: Not  on file    Inability: Not on file   Transportation needs    Medical: Not on file    Non-medical: Not on file  Tobacco Use   Smoking status: Never Smoker   Smokeless tobacco: Never Used  Substance and Sexual Activity   Alcohol use: No   Drug use: No   Sexual activity: Never  Lifestyle   Physical activity    Days per week: Not on file    Minutes per session: Not on file   Stress: Not on file  Relationships   Social connections    Talks on phone: Not on file    Gets together: Not on file    Attends religious service: Not on file    Active member of club or organization: Not on file    Attends meetings of clubs or organizations: Not on file    Relationship status: Not on file   Intimate partner violence    Fear of current or ex partner: Not on file    Emotionally abused: Not on file    Physically abused: Not on file    Forced sexual activity: Not on file  Other Topics Concern   Not on file  Social History Narrative   Daily Caffeine, Coke      PHYSICAL EXAM  Vitals:   03/17/19 1124  BP: 125/66  Pulse: 68  Temp: 98.7 F (37.1 C)  Weight: 181 lb 3.2 oz (82.2 kg)  Height: '5\' 5"'  (1.651 m)   Body mass index is 30.15 kg/m.  Generalized: Well developed, in no acute distress  MMSE - Mini Mental State Exam 03/17/2019 09/18/2017  Orientation to time 5 5  Orientation to Place 5 5  Registration 3 3  Attention/ Calculation 4 5  Recall 3 3  Language- name 2 objects 2 2  Language- repeat 1 1  Language- follow 3 step command 3 3  Language- read & follow direction 1 1  Write a sentence 1 1  Copy design 1 1  Copy design-comments named 9 animals -  Total score 29 30    Neurological examination  Mentation: Alert oriented to time, place, history taking. Follows all commands speech and language fluent Cranial nerve II-XII: Pupils were equal round reactive to light. Extraocular movements were full, visual field were full on confrontational test. Facial sensation and  strength were normal. Head turning and shoulder shrug  were normal and symmetric. Motor: The motor testing reveals 5 over 5 strength of all 4 extremities. Good symmetric motor tone is noted throughout.  Sensory: Sensory testing is intact to soft touch, pinprick and vibration on all 4 extremities. No evidence of extinction is noted.  Mild to moderate swelling of lower extremities, is pitting Coordination: Cerebellar testing reveals good finger-nose-finger and heel-to-shin bilaterally.  Gait and station: Gait is normal. Tandem gait is unsteady. Reflexes: Deep tendon reflexes are symmetric and normal bilaterally.   DIAGNOSTIC DATA (LABS, IMAGING, TESTING) - I reviewed patient records, labs, notes, testing and imaging myself where available.  Lab Results  Component Value Date   WBC 8.7 05/07/2018   HGB 12.1 05/07/2018   HCT 37.8 05/07/2018   MCV 89.6 05/07/2018   PLT 217 05/07/2018      Component Value Date/Time   NA 139 05/07/2018 0642   K 3.4 (L) 05/07/2018 0642   CL 104 05/07/2018 0642   CO2 26 05/07/2018 0642   GLUCOSE 184 (H) 05/07/2018 0642   BUN 19 05/07/2018 0642   CREATININE 0.75 05/07/2018 0642   CALCIUM 8.9 05/07/2018 0642   PROT 6.7 05/07/2018 0642   ALBUMIN 3.5 05/07/2018 0642   AST 15 05/07/2018 0642   ALT 17 05/07/2018 0642   ALKPHOS 94 05/07/2018 0642   BILITOT 0.6 05/07/2018 0642   GFRNONAA >60 05/07/2018 0642   GFRAA >60 05/07/2018 0642   Lab Results  Component Value Date   CHOL 142 10/23/2016   HDL 52 10/23/2016   LDLCALC 68 10/23/2016   TRIG 109 10/23/2016   CHOLHDL 2.7 10/23/2016   Lab Results  Component Value Date   HGBA1C 9.4 (H) 05/07/2018   Lab Results  Component Value Date   VITAMINB12 1,648 (H) 09/05/2016   No results found for: TSH  ASSESSMENT AND PLAN 68 y.o. year old female  has a past medical history of Anxiety, Arthritis, Bursitis of knee, Chronic back pain, Depression, Diabetes mellitus type II, Diabetic peripheral neuropathy ([place])  (10/20/2018), Diverticulitis, Fatty liver, Fibromyalgia, Gastric polyp, Gastroparesis, GERD (gastroesophageal reflux disease), H/O hiatal hernia, Headache(784.0), Heart attack ([place]) (2014), gastritis, Hyperlipidemia, Hypertension, Irritable bowel syndrome (IBS), Memory difficulties (09/18/2017), Migraine without aura, without mention of intractable migraine without mention of status migrainosus, Obesity, Pancreatic cyst, Renal cyst, Sleep apnea, Sprain of neck (03/18/2013), Uterine cancer ([place]) (dx'd 2000), Vasculitis ([place]), and Wears glasses. here with:  1. Peripheral neuropathy 2. Mild memory disturbance   Her blood sugar is not under good control. Her recent A1C was 12. She is going to have to start checking her blood sugars. All she is taking is [place], metformin. She will continue to work with her endocrinologist.  Her memory score is stable. We will follow her memory overtime.  Today, we will check for reversible causes of peripheral neuropathy as evidenced on EMG nerve conduction evaluation in March 2020. I have ordered b.burgdorfi antibodies, vitamin B12, sed rate, ANA, RA factor, multiple myeloma  panel.  The neuropathy is not bothersome to the patient at this time.  She will follow-up with her primary care doctor regarding the swelling in her legs.  She will continue on Plavix which was suggested after MRI of the brain showed white matter changes.  She has history of hypertension and diabetes.  She is allergic to aspirin.  She will follow-up in 6 months or sooner if needed.  I advised that if her symptoms worsen or she develops any new symptoms she should let us know.   I spent 25 minutes with the patient. 50% of this time was spent discussing her plan of care.   Butler Denmark, AGNP-C, DNP 03/17/2019, 11:29 AM [facility] [address], [place] [address], [place] [address] [phone]

## 2019-03-17 ENCOUNTER — Encounter: Payer: Self-pay | Admitting: Neurology

## 2019-03-17 ENCOUNTER — Other Ambulatory Visit: Payer: Self-pay

## 2019-03-17 ENCOUNTER — Ambulatory Visit (INDEPENDENT_AMBULATORY_CARE_PROVIDER_SITE_OTHER): Payer: Medicare Other | Admitting: Neurology

## 2019-03-17 VITALS — BP 125/66 | HR 68 | Temp 98.7°F | Ht 65.0 in | Wt 181.2 lb

## 2019-03-17 DIAGNOSIS — E1142 Type 2 diabetes mellitus with diabetic polyneuropathy: Secondary | ICD-10-CM

## 2019-03-17 DIAGNOSIS — R413 Other amnesia: Secondary | ICD-10-CM

## 2019-03-17 DIAGNOSIS — R202 Paresthesia of skin: Secondary | ICD-10-CM

## 2019-03-17 NOTE — Progress Notes (Signed)
I have read the note, and I agree with the clinical assessment and plan.  Elantra Caprara K Zana Biancardi   

## 2019-03-17 NOTE — Patient Instructions (Addendum)
We will check lab work today for reversible causes of neuropathy. Please continue taking Plavix, as prescribed by your primary care doctor. We will follow your memory overtime. Please work with your endocrinologist for management of diabetes.

## 2019-03-19 LAB — MULTIPLE MYELOMA PANEL, SERUM
Albumin SerPl Elph-Mcnc: 3.4 g/dL (ref 2.9–4.4)
Albumin/Glob SerPl: 1.2 (ref 0.7–1.7)
Alpha 1: 0.2 g/dL (ref 0.0–0.4)
Alpha2 Glob SerPl Elph-Mcnc: 0.8 g/dL (ref 0.4–1.0)
B-Globulin SerPl Elph-Mcnc: 1.1 g/dL (ref 0.7–1.3)
Gamma Glob SerPl Elph-Mcnc: 0.8 g/dL (ref 0.4–1.8)
Globulin, Total: 3 g/dL (ref 2.2–3.9)
IgA/Immunoglobulin A, Serum: 302 mg/dL (ref 87–352)
IgG (Immunoglobin G), Serum: 881 mg/dL (ref 586–1602)
IgM (Immunoglobulin M), Srm: 83 mg/dL (ref 26–217)
Total Protein: 6.4 g/dL (ref 6.0–8.5)

## 2019-03-19 LAB — VITAMIN B12: Vitamin B-12: 907 pg/mL (ref 232–1245)

## 2019-03-19 LAB — B. BURGDORFI ANTIBODIES: Lyme IgG/IgM Ab: <0.91 {ISR} (ref 0.00–0.90)

## 2019-03-19 LAB — SEDIMENTATION RATE: Sed Rate: 14 mm/hr (ref 0–40)

## 2019-03-19 LAB — ANA W/REFLEX: Anti Nuclear Antibody (ANA): NEGATIVE

## 2019-03-19 LAB — RHEUMATOID FACTOR: Rhuematoid fact SerPl-aCnc: <10 IU/mL (ref 0.0–13.9)

## 2019-03-22 ENCOUNTER — Telehealth: Payer: Self-pay | Admitting: *Deleted

## 2019-03-22 NOTE — Telephone Encounter (Signed)
-----   Message from Suzzanne Cloud, NP sent at 03/21/2019 11:53 AM EDT ----- Please call the patient. Laboratory evaluations were unremarkable for treatable causes of peripheral neuropathy.

## 2019-03-22 NOTE — Telephone Encounter (Signed)
My chart message sent with results of labs.

## 2019-04-15 ENCOUNTER — Other Ambulatory Visit: Payer: Self-pay | Admitting: Neurology

## 2019-06-03 DIAGNOSIS — Z23 Encounter for immunization: Secondary | ICD-10-CM | POA: Diagnosis not present

## 2019-07-21 ENCOUNTER — Encounter: Payer: Self-pay | Admitting: Physician Assistant

## 2019-07-21 ENCOUNTER — Other Ambulatory Visit: Payer: Self-pay

## 2019-07-21 ENCOUNTER — Ambulatory Visit (INDEPENDENT_AMBULATORY_CARE_PROVIDER_SITE_OTHER): Payer: Medicare Other | Admitting: Physician Assistant

## 2019-07-21 VITALS — BP 134/70 | HR 71 | Temp 98.4°F | Ht 65.0 in | Wt 184.6 lb

## 2019-07-21 DIAGNOSIS — I2510 Atherosclerotic heart disease of native coronary artery without angina pectoris: Secondary | ICD-10-CM | POA: Diagnosis not present

## 2019-07-21 DIAGNOSIS — E785 Hyperlipidemia, unspecified: Secondary | ICD-10-CM

## 2019-07-21 DIAGNOSIS — I10 Essential (primary) hypertension: Secondary | ICD-10-CM | POA: Diagnosis not present

## 2019-07-21 DIAGNOSIS — M7989 Other specified soft tissue disorders: Secondary | ICD-10-CM

## 2019-07-21 DIAGNOSIS — E119 Type 2 diabetes mellitus without complications: Secondary | ICD-10-CM

## 2019-07-21 DIAGNOSIS — R002 Palpitations: Secondary | ICD-10-CM | POA: Diagnosis not present

## 2019-07-21 MED ORDER — FUROSEMIDE 20 MG PO TABS: 20.0000 mg | ORAL_TABLET | ORAL | 2 refills | Status: DC | PRN

## 2019-07-21 NOTE — Patient Instructions (Signed)
Medication Instructions:   Take Lasix 20 mg AS NEEDED for leg swelling  *If you need a refill on your cardiac medications before your next appointment, please call your pharmacy*  Lab Work:  NONE ordered at this time of appointment   If you have labs (blood work) drawn today and your tests are completely normal, you will receive your results only by: Marland Kitchen MyChart Message (if you have MyChart) OR . A paper copy in the mail If you have any lab test that is abnormal or we need to change your treatment, we will call you to review the results.  Testing/Procedures: .Your physician has requested that you have a lower or upper extremity venous duplex. This test is an ultrasound of the veins in the legs or arms. It looks at venous blood flow that carries blood from the heart to the legs or arms. Allow one hour for a Lower Venous exam. Allow thirty minutes for an Upper Venous exam. There are no restrictions or special instructions.   Please schedule within 1-2 weeks  Follow-Up: At [facility], you and your health needs are our priority.  As part of our continuing mission to provide you with exceptional heart care, we have created designated Provider Care Teams.  These Care Teams include your primary Cardiologist (physician) and Advanced Practice Providers (APPs -  Physician Assistants and Nurse Practitioners) who all work together to provide you with the care you need, when you need it.  Your next appointment:   3 month(s)  The format for your next appointment:   In Person  Provider:   Raliegh Ip Mali Hilty, MD  Other Instructions  You will need to get compression stockings  Elevate your legs

## 2019-07-21 NOTE — Progress Notes (Signed)
Cardiology Office Note:    Date:  07/23/2019   ID:  Alexis [name], DOB 1951-03-28, MRN [id number]  PCP:  Deland Pretty, MD  Cardiologist:  Pixie Casino, MD  Electrophysiologist:  None   Referring MD: Deland Pretty, MD   Chief Complaint  Patient presents with  . Follow-up    seen for Dr. Debara Pickett    History of Present Illness:    Alexis [name] is a 68 y.o. female with a hx of DM 2, HLD, HTN, fatty liver disease, IBS, CAD and GERD. There was a past history of mild out of the hospital MI, however this was likely due to panic attack.  There was no troponin evidence at the time due to her delayed presentation.  Myoview since then has not shown any scar.  In the past, she wore a heart monitor in 2004 due to episode of syncope, this demonstrated occasional unifocal PVCs, no sustained arrhythmia.  Myoview obtained in June 2014 showed EF 63%, no evidence of previous infarct.  In 2018, she wore a 48-hour Holter monitor that showed occasional PVCs and PVCs as well as short run of SVT.  She had a coronary CTA that showed mild two-vessel disease with plaque in the LAD and RCA, coronary calcium score of 90.  She also underwent repeat sleep study that demonstrated moderate obstructive sleep apnea.  She was started on Farxiga for diabetes in the setting of coronary artery disease.  Her last appointment with Dr. Debara Pickett was in August 2019 at which time she was doing well from the heart perspective.  She did have dizziness with changing the body position and the tilting of her head.  Carotid Doppler obtained in August 2019 showed mild disease bilaterally.  She presents today for 1 year follow-up.  She presents today with multiple complaints.  She continues to have occasional intermittent palpitation that only last about a second.  She initially mention she thinks she has atrial fibrillation, however I reassured her that if her palpitation only last about a second, I suspect it is more likely to be the  previous PVCs that was shown on her heart monitor.  She was instructed to continue on the current dose of metoprolol 100 mg twice daily.  She also complained of right lower extremity swelling.  This has been going on for several weeks.  I suspect this is more venous stasis heart failure.  There is no swelling on the left side.  Suspicion for DVT fairly low, I will obtain a venous Doppler to rule out DVT.  I recommended leg elevation and compression stocking.  I also gave her 20 mg of Lasix on a as needed basis for leg swelling as well.    Past Medical History:  Diagnosis Date  . Anxiety   . Arthritis    "knees" (05/25/2014)  . Bursitis of knee    "both"  . Chronic back pain   . Depression   . Diabetes mellitus type II   . Diabetic peripheral neuropathy ([place]) 10/20/2018  . Diverticulitis   . Fatty liver   . Fibromyalgia   . Gastric polyp    hyperplastic  . Gastroparesis    "recently dx'd" (05/25/2014)  . GERD (gastroesophageal reflux disease)   . H/O hiatal hernia   . [id number])    "monthly" (05/25/2014)  . Heart attack ([place]) 2014   "mild"  . Hx of gastritis   . Hyperlipidemia   . Hypertension   . Irritable bowel syndrome (IBS)   .  Memory difficulties 09/18/2017  . Migraine without aura, without mention of intractable migraine without mention of status migrainosus    "related to allergies; have them in the spring and fall" (05/25/2014)  . Obesity   . Pancreatic cyst    peripancreatic cystic lesion  . Renal cyst   . Sleep apnea    "wore mask; took it off in my sleep; quit wearing it" (05/25/2014)  . Sprain of neck 03/18/2013  . Uterine cancer ([place]) dx'd 2000   surg only  . Vasculitis ([place])    "irritates my legs"  . Wears glasses     Past Surgical History:  Procedure Laterality Date  . ANTERIOR CERVICAL DECOMPRESSION/DISCECTOMY FUSION 4 LEVELS N/A 05/07/2018   Procedure: ANTERIOR CERVICAL DECOMPRESSION FUSION, CERVICAL 4-5,CERVICAL 5-6, CERVICAL 6-7 WITH  INSTRUMENTATION AND ALLOGRAFT;  Surgeon: Phylliss Bob, MD;  Location: [place];  Service: Orthopedics;  Laterality: N/A;  . APPENDECTOMY  ~ 1967  . BREAST CYST EXCISION Right 1990  . COLONOSCOPY    . LAPAROSCOPIC CHOLECYSTECTOMY  1990  . LEFT HEART CATHETERIZATION WITH CORONARY ANGIOGRAM N/A 05/26/2014   Procedure: LEFT HEART CATHETERIZATION WITH CORONARY ANGIOGRAM;  Surgeon: Troy Sine, MD;  Location: [facility] CATH LAB;  Service: Cardiovascular;  Laterality: N/A;  . SINUS SURGERY WITH INSTATRAK  2000  . TUBAL LIGATION  ~ 1982  . VAGINAL HYSTERECTOMY  2000    Current Medications: Current Meds  Medication Sig  . acetaminophen (TYLENOL) 500 MG tablet Take 500 mg by mouth every 6 (six) hours as needed for moderate pain or headache.   . ALPRAZolam (XANAX) 0.5 MG tablet Take 0.5-1 mg by mouth at bedtime as needed for anxiety.   Marland Kitchen atorvastatin (LIPITOR) 20 MG tablet Take 20 mg by mouth daily.  Marland Kitchen b complex vitamins tablet Take 1 tablet by mouth daily.  . clopidogrel (PLAVIX) 75 MG tablet TAKE 1 TABLET BY MOUTH EVERY DAY  . Cyanocobalamin (B-12 PO) Take 1 tablet by mouth at bedtime.  . fish oil-omega-3 fatty acids 1000 MG capsule Take 1 g by mouth at bedtime.   . metoprolol succinate (TOPROL-XL) 50 MG 24 hr tablet Take 100 mg by mouth 2 (two) times daily. Take with or immediately following a meal.   . MINIVELLE 0.025 MG/24HR Apply 1 patch topically 2 (two) times a week.  . pregabalin (LYRICA) 50 MG capsule Take 50 mg by mouth 2 (two) times daily.  . RABEprazole (ACIPHEX) 20 MG tablet Take 1 tablet (20 mg total) by mouth 2 (two) times daily.  . rizatriptan (MAXALT) 10 MG tablet Take 10 mg by mouth as needed for migraine. May repeat in 2 hours if needed   . tamsulosin (FLOMAX) 0.4 MG CAPS capsule Take 0.4 mg by mouth at bedtime.   Marland Kitchen venlafaxine XR (EFFEXOR-XR) 150 MG 24 hr capsule Take 150 mg by mouth 2 (two) times daily.  . zaleplon (SONATA) 5 MG capsule Take 5 mg by mouth at bedtime.      Allergies:   Aspirin, Codeine, Naproxen, and Sulfonamide derivatives   Social History   Socioeconomic History  . Marital status: Married    Spouse name: Not on file  . Number of children: 2  . Years of education: Not on file  . Highest education level: Not on file  Occupational History  . Occupation: Retired    Fish farm manager: RETIRED  Tobacco Use  . Smoking status: Never Smoker  . Smokeless tobacco: Never Used  Substance and Sexual Activity  . Alcohol use: No  .  Drug use: No  . Sexual activity: Never  Other Topics Concern  . Not on file  Social History Narrative   Daily Caffeine, Coke   Social Determinants of Health   Financial Resource Strain:   . Difficulty of Paying Living Expenses: Not on file  Food Insecurity:   . Worried About Charity fundraiser in the Last Year: Not on file  . Ran Out of Food in the Last Year: Not on file  Transportation Needs:   . Lack of Transportation (Medical): Not on file  . Lack of Transportation (Non-Medical): Not on file  Physical Activity:   . Days of Exercise per Week: Not on file  . Minutes of Exercise per Session: Not on file  Stress:   . Feeling of Stress : Not on file  Social Connections:   . Frequency of Communication with Friends and Family: Not on file  . Frequency of Social Gatherings with Friends and Family: Not on file  . Attends Religious Services: Not on file  . Active Member of Clubs or Organizations: Not on file  . Attends Archivist Meetings: Not on file  . Marital Status: Not on file     Family History: The patient's family history includes Breast cancer in her sister; Colon cancer in her maternal uncle; Colon polyps in her father; Diabetes in her maternal aunt; Heart disease in her father, maternal uncle, and another family member; Irritable bowel syndrome in her daughter; Ovarian cancer in her maternal aunt; Stomach cancer in her maternal aunt.  ROS:   Please see the history of present illness.     All  other systems reviewed and are negative.  EKGs/Labs/Other Studies Reviewed:    The following studies were reviewed today:  Myoview 02/05/2013 Impression Exercise Capacity:  Lexiscan with no exercise. BP Response:  Normal blood pressure response. Clinical Symptoms:  Mild chest pain/dyspnea. ECG Impression:  No significant ST segment change suggestive of ischemia. Comparison with Prior Nuclear Study: No images to compare  Overall Impression:  Normal stress nuclear study.  LV Wall Motion:  NL LV Function; NL Wall Motion  EKG:  EKG is ordered today.  The ekg ordered today demonstrates normal sinus rhythm without significant ST-T wave changes.  Poor R wave progression anterior leads.  Recent Labs: No results found for requested labs within last 8760 hours.  Recent Lipid Panel    Component Value Date/Time   CHOL 142 10/23/2016 1115   TRIG 109 10/23/2016 1115   HDL 52 10/23/2016 1115   CHOLHDL 2.7 10/23/2016 1115   VLDL 22 10/23/2016 1115   LDLCALC 68 10/23/2016 1115    Physical Exam:    VS:  BP 134/70   Pulse 71   Temp 98.4 F (36.9 C)   Ht 5\' 5"  (1.651 m)   Wt 184 lb 9.6 oz (83.7 kg)   SpO2 94%   BMI 30.72 kg/m     Wt Readings from Last 3 Encounters:  07/21/19 184 lb 9.6 oz (83.7 kg)  03/17/19 181 lb 3.2 oz (82.2 kg)  09/16/18 185 lb (83.9 kg)     GEN:  Well nourished, well developed in no acute distress HEENT: Normal NECK: No JVD; No carotid bruits LYMPHATICS: No lymphadenopathy CARDIAC: RRR, no murmurs, rubs, gallops RESPIRATORY:  Clear to auscultation without rales, wheezing or rhonchi  ABDOMEN: Soft, non-tender, non-distended MUSCULOSKELETAL:  No edema; No deformity  SKIN: Warm and dry NEUROLOGIC:  Alert and oriented x 3 PSYCHIATRIC:  Normal affect  ASSESSMENT:    1. Palpitations   2. Leg swelling   3. Essential hypertension   4. Hyperlipidemia, unspecified hyperlipidemia type   5. Controlled type 2 diabetes mellitus without complication, without  long-term current use of insulin ([place])   6. Coronary artery disease involving native coronary artery of native heart without angina pectoris    PLAN:    In order of problems listed above:  1. Palpitation: I suspect her palpitations are PVCs as they are quite transient.  I recommended continue observation at this time.  2. Leg swelling: She has more swelling in the right lower extremity.  I recommend a venous Doppler to rule out DVT.  I suspect her leg swelling is probably more venous insufficiency.  I recommended leg elevation and a compression stocking.  I have given her some as needed dose of Lasix, however she should use leg elevation first and only if her swelling worsens can she use occasional Lasix as needed.  3. CAD: Previous coronary CT showed nonobstructive two-vessel disease  4. Hypertension: Blood pressure stable  5. Hyperlipidemia: Continue statin therapy  6. DM2: Managed by primary care provider.   Medication Adjustments/Labs and Tests Ordered: Current medicines are reviewed at length with the patient today.  Concerns regarding medicines are outlined above.  Orders Placed This Encounter  Procedures  . EKG 12-Lead  . VAS [place] LOWER EXTREMITY VENOUS (DVT)   Meds ordered this encounter  Medications  . furosemide (LASIX) 20 MG tablet    Sig: Take 1 tablet (20 mg total) by mouth as needed for edema.    Dispense:  30 tablet    Refill:  2    Patient Instructions  Medication Instructions:   Take Lasix 20 mg AS NEEDED for leg swelling  *If you need a refill on your cardiac medications before your next appointment, please call your pharmacy*  Lab Work:  NONE ordered at this time of appointment   If you have labs (blood work) drawn today and your tests are completely normal, you will receive your results only by: Marland Kitchen MyChart Message (if you have MyChart) OR . A paper copy in the mail If you have any lab test that is abnormal or we need to change your treatment, we will  call you to review the results.  Testing/Procedures: .Your physician has requested that you have a lower or upper extremity venous duplex. This test is an ultrasound of the veins in the legs or arms. It looks at venous blood flow that carries blood from the heart to the legs or arms. Allow one hour for a Lower Venous exam. Allow thirty minutes for an Upper Venous exam. There are no restrictions or special instructions.   Please schedule within 1-2 weeks  Follow-Up: At [facility], you and your health needs are our priority.  As part of our continuing mission to provide you with exceptional heart care, we have created designated Provider Care Teams.  These Care Teams include your primary Cardiologist (physician) and Advanced Practice Providers (APPs -  Physician Assistants and Nurse Practitioners) who all work together to provide you with the care you need, when you need it.  Your next appointment:   3 month(s)  The format for your next appointment:   In Person  Provider:   Raliegh Ip Mali Hilty, MD  Other Instructions  You will need to get compression stockings  Elevate your legs      Weston Brass Almyra Deforest, [place]  07/23/2019 11:39 PM    [facility]

## 2019-07-23 ENCOUNTER — Encounter: Payer: Self-pay | Admitting: Physician Assistant

## 2019-07-27 ENCOUNTER — Other Ambulatory Visit: Payer: Self-pay

## 2019-07-27 ENCOUNTER — Ambulatory Visit (HOSPITAL_COMMUNITY)
Admission: RE | Admit: 2019-07-27 | Discharge: 2019-07-27 | Disposition: A | Discharge status: Home / Self Care | Payer: Medicare Other | Source: Ambulatory Visit | Attending: Cardiovascular Disease | Admitting: Cardiovascular Disease

## 2019-07-27 DIAGNOSIS — M7989 Other specified soft tissue disorders: Secondary | ICD-10-CM | POA: Insufficient documentation

## 2019-07-29 ENCOUNTER — Telehealth: Payer: Self-pay

## 2019-07-29 NOTE — Telephone Encounter (Addendum)
Left a detailed message for the patient of the comment from Almyra Deforest, PA-C and was released to patient's MyChart  ----- Message from [place], [place] sent at 07/29/2019  4:05 PM EST ----- No DVT

## 2019-08-05 ENCOUNTER — Other Ambulatory Visit: Payer: Self-pay | Admitting: Physician Assistant

## 2019-08-20 ENCOUNTER — Other Ambulatory Visit: Payer: Self-pay | Admitting: Physician Assistant

## 2019-09-16 ENCOUNTER — Encounter: Payer: Self-pay | Admitting: Neurology

## 2019-09-16 ENCOUNTER — Telehealth: Payer: Self-pay | Admitting: Neurology

## 2019-09-16 ENCOUNTER — Telehealth (INDEPENDENT_AMBULATORY_CARE_PROVIDER_SITE_OTHER): Payer: Medicare Other | Admitting: Neurology

## 2019-09-16 DIAGNOSIS — R413 Other amnesia: Secondary | ICD-10-CM | POA: Diagnosis not present

## 2019-09-16 DIAGNOSIS — G959 Disease of spinal cord, unspecified: Secondary | ICD-10-CM | POA: Diagnosis not present

## 2019-09-16 DIAGNOSIS — E1142 Type 2 diabetes mellitus with diabetic polyneuropathy: Secondary | ICD-10-CM | POA: Diagnosis not present

## 2019-09-16 DIAGNOSIS — M797 Fibromyalgia: Secondary | ICD-10-CM | POA: Diagnosis not present

## 2019-09-16 NOTE — Progress Notes (Signed)
   Virtual Visit via Video Note  I connected with Alexis [name] on 09/16/19 at 10:00 AM EST by a video enabled telemedicine application and verified that I am speaking with the correct person using two identifiers.  Location: Patient: The patient is at home. Provider: Physician in office.   I discussed the limitations of evaluation and management by telemedicine and the availability of in person appointments. The patient expressed understanding and agreed to proceed.  History of Present Illness: Alexis [name] is a 69 year old right-handed white female with a history of diabetes and diabetic peripheral neuropathy.  She has a history of cervical spine surgery, she had spinal cord compression with cervical myelopathy.  She does have some degree of small vessel disease and does have a gait disorder.  The patient has not had recent falls.  She reports some difficulty with memory, but has been relatively stable over time.  She is not on any medications for memory.  She has had some problems with blood sugar control, her hemoglobin A1c within the last 6 months was 12.  She is trying to do better more recently with this.  The patient is doing puzzles regularly which she thinks helps her cognitive abilities.  She is not sleeping well, she will wake up every 4 hours or so but reports a fairly good energy level during the day.  The patient is doing most of her activities of daily living without difficulty, she is not driving much, at times she has some problems orienting herself to where she is going.  She has difficulty with cooking, trying to put together a larger meal.  She has difficulty multitasking.  She at times has trouble with word finding.  There has not been much progression since last seen.   Observations/Objective: Examination reveals that the patient is alert and cooperative, symmetric face is noted.  Speech is well enunciated, not aphasic or dysarthric.  She has full extraocular movements,  she has good midline protrusion of the tongue with good lateral movement of the tongue.  She has good finger-nose-finger and heel shin bilaterally.  Gait is relatively unremarkable.  She is able to perform tandem gait with some mild instability.  Romberg is negative.  A MoCA-blind evaluation was done, the patient scored 19/22.  Assessment and Plan: 1.  Mild memory disturbance, mild cognitive impairment  2.  Gait disorder, multifactorial  3.  History of cervical myelopathy  4.  Diabetic peripheral neuropathy  The patient is doing relatively well at this time, we will continue to follow the memory issues conservatively over time.  She will follow-up in 8 or 9 months.  She will contact our office if any new issues arise.  Follow Up Instructions: 59-month follow-up, may see nurse practitioner   I discussed the assessment and treatment plan with the patient. The patient was provided an opportunity to ask questions and all were answered. The patient agreed with the plan and demonstrated an understanding of the instructions.   The patient was advised to call back or seek an in-person evaluation if the symptoms worsen or if the condition fails to improve as anticipated.  I provided 25 minutes of non-face-to-face time during this encounter.   Kathrynn Ducking, MD

## 2019-09-16 NOTE — Telephone Encounter (Signed)
I called patient and LVM to schedule 9 month follow-up with NP per Dr. Jannifer Franklin. Requested patient call back to schedule.

## 2019-10-19 ENCOUNTER — Ambulatory Visit: Payer: Medicare Other | Admitting: Internal Medicine

## 2019-10-21 ENCOUNTER — Telehealth (INDEPENDENT_AMBULATORY_CARE_PROVIDER_SITE_OTHER): Payer: Medicare Other | Admitting: Physician Assistant

## 2019-10-21 ENCOUNTER — Encounter: Payer: Self-pay | Admitting: Physician Assistant

## 2019-10-21 VITALS — BP 137/73 | HR 70 | Ht 65.0 in

## 2019-10-21 DIAGNOSIS — E119 Type 2 diabetes mellitus without complications: Secondary | ICD-10-CM

## 2019-10-21 DIAGNOSIS — I2510 Atherosclerotic heart disease of native coronary artery without angina pectoris: Secondary | ICD-10-CM | POA: Diagnosis not present

## 2019-10-21 DIAGNOSIS — I10 Essential (primary) hypertension: Secondary | ICD-10-CM

## 2019-10-21 DIAGNOSIS — E785 Hyperlipidemia, unspecified: Secondary | ICD-10-CM

## 2019-10-21 NOTE — Patient Instructions (Signed)
Medication Instructions:  Your physician recommends that you continue on your current medications as directed. Please refer to the Current Medication list given to you today.  *If you need a refill on your cardiac medications before your next appointment, please call your pharmacy*  Lab Work: NONE ordered at this time of appointment   If you have labs (blood work) drawn today and your tests are completely normal, you will receive your results only by: . MyChart Message (if you have MyChart) OR . A paper copy in the mail If you have any lab test that is abnormal or we need to change your treatment, we will call you to review the results.  Testing/Procedures: NONE ordered at this time of appointment   Follow-Up: At [facility], you and your health needs are our priority.  As part of our continuing mission to provide you with exceptional heart care, we have created designated Provider Care Teams.  These Care Teams include your primary Cardiologist (physician) and Advanced Practice Providers (APPs -  Physician Assistants and Nurse Practitioners) who all work together to provide you with the care you need, when you need it.  Your next appointment:   1 year(s)  The format for your next appointment:   In Person  Provider:   K. Chad Hilty, MD  Other Instructions   

## 2019-10-21 NOTE — Progress Notes (Signed)
Virtual Visit via Telephone Note   This visit type was conducted due to national recommendations for restrictions regarding the COVID-19 Pandemic (e.g. social distancing) in an effort to limit this patient's exposure and mitigate transmission in our community.  Due to her co-morbid illnesses, this patient is at least at moderate risk for complications without adequate follow up.  This format is felt to be most appropriate for this patient at this time.  The patient did not have access to video technology/had technical difficulties with video requiring transitioning to audio format only (telephone).  All issues noted in this document were discussed and addressed.  No physical exam could be performed with this format.  Please refer to the patient's chart for her  consent to telehealth for [facility].   The patient was identified using 2 identifiers.  Date:  10/21/2019   ID:  Alexis [name], DOB 1951/01/14, MRN [id number]  Patient Location: Home Provider Location: Home  PCP:  Deland Pretty, MD  Cardiologist:  Pixie Casino, MD  Electrophysiologist:  None   Evaluation Performed:  Follow-Up Visit  Chief Complaint:  followup  History of Present Illness:    Alexis [name] is a 69 y.o. female with DM 2, HLD, HTN, fatty liver disease, IBS, CAD, OSA and GERD. There was a past history of mild out of the hospital MI, however this was likely due to panic attack.  There was no troponin evidence at the time due to her delayed presentation.  Myoview since then has not shown any scar.  In the past, she wore a heart monitor in 2004 due to episode of syncope, this demonstrated occasional unifocal PVCs, no sustained arrhythmia.  Myoview obtained in June 2014 showed EF 63%, no evidence of previous infarct.  In 2018, she wore a 48-hour Holter monitor that showed occasional PVCs and PVCs as well as short run of SVT.  She had a coronary CTA in 08/2016 that showed mild two-vessel disease with plaque in  the LAD and RCA, coronary calcium score of 90.  She also underwent repeat sleep study that demonstrated moderate obstructive sleep apnea.  She was started on Farxiga for diabetes in the setting of coronary artery disease.  Her last appointment with Dr. Debara Pickett was in August 2019 at which time she was doing well from the heart perspective.  She did have dizziness with changing the body position and tilting of her head.  Carotid Doppler obtained in August 2019 showed mild disease bilaterally.  I last saw the patient in December 2020 with multiple complaints.  She complained of occasional palpitation that lasted only about a second.  I suspected it was more likely to be PVC than atrial fibrillation.  I continued her on the same dosage of the metoprolol.  She also complained of right lower extremity edema.  I gave her 20 mg Lasix on as-needed basis.  Lower extremity venous Doppler was negative.  She is being followed by Dr. Jannifer Franklin of neurology for mild memory disturbance.  Recent follow-up with neurology was on 09/16/2019.  Patient presents today for virtual visit.  Lower extremity edema has significantly improved.  She denies any significant chest discomfort or worsening shortness of breath.  The patient does not have symptoms concerning for COVID-19 infection (fever, chills, cough, or new shortness of breath).    Past Medical History:  Diagnosis Date  . Anxiety   . Arthritis    "knees" (05/25/2014)  . Bursitis of knee    "both"  .  Chronic back pain   . Depression   . Diabetes mellitus type II   . Diabetic peripheral neuropathy ([place]) 10/20/2018  . Diverticulitis   . Fatty liver   . Fibromyalgia   . Gastric polyp    hyperplastic  . Gastroparesis    "recently dx'd" (05/25/2014)  . GERD (gastroesophageal reflux disease)   . H/O hiatal hernia   . [id number])    "monthly" (05/25/2014)  . Heart attack ([place]) 2014   "mild"  . Hx of gastritis   . Hyperlipidemia   . Hypertension   . Irritable  bowel syndrome (IBS)   . Memory difficulties 09/18/2017  . Migraine without aura, without mention of intractable migraine without mention of status migrainosus    "related to allergies; have them in the spring and fall" (05/25/2014)  . Obesity   . Pancreatic cyst    peripancreatic cystic lesion  . Renal cyst   . Sleep apnea    "wore mask; took it off in my sleep; quit wearing it" (05/25/2014)  . Sprain of neck 03/18/2013  . Uterine cancer ([place]) dx'd 2000   surg only  . Vasculitis ([place])    "irritates my legs"  . Wears glasses    Past Surgical History:  Procedure Laterality Date  . ANTERIOR CERVICAL DECOMPRESSION/DISCECTOMY FUSION 4 LEVELS N/A 05/07/2018   Procedure: ANTERIOR CERVICAL DECOMPRESSION FUSION, CERVICAL 4-5,CERVICAL 5-6, CERVICAL 6-7 WITH INSTRUMENTATION AND ALLOGRAFT;  Surgeon: Phylliss Bob, MD;  Location: [facility];  Service: Orthopedics;  Laterality: N/A;  . APPENDECTOMY  ~ 1967  . BREAST CYST EXCISION Right 1990  . COLONOSCOPY    . LAPAROSCOPIC CHOLECYSTECTOMY  1990  . LEFT HEART CATHETERIZATION WITH CORONARY ANGIOGRAM N/A 05/26/2014   Procedure: LEFT HEART CATHETERIZATION WITH CORONARY ANGIOGRAM;  Surgeon: Troy Sine, MD;  Location: [facility] CATH LAB;  Service: Cardiovascular;  Laterality: N/A;  . SINUS SURGERY WITH INSTATRAK  2000  . TUBAL LIGATION  ~ 1982  . VAGINAL HYSTERECTOMY  2000     Current Meds  Medication Sig  . acetaminophen (TYLENOL) 500 MG tablet Take 500 mg by mouth every 6 (six) hours as needed for moderate pain or headache.   . ALPRAZolam (XANAX) 0.5 MG tablet Take 0.5-1 mg by mouth at bedtime as needed for anxiety.   Marland Kitchen amLODipine (NORVASC) 5 MG tablet Take 5 mg by mouth daily.  Marland Kitchen atorvastatin (LIPITOR) 20 MG tablet Take 20 mg by mouth daily.  Marland Kitchen b complex vitamins tablet Take 1 tablet by mouth daily.  . clopidogrel (PLAVIX) 75 MG tablet TAKE 1 TABLET BY MOUTH EVERY DAY  . cyanocobalamin 1000 MCG tablet Vitamin B-12  1,000 mcg tablet  Take by oral route.    Marland Kitchen FARXIGA 10 MG TABS tablet Take 10 mg by mouth daily.  . fish oil-omega-3 fatty acids 1000 MG capsule Take 1 g by mouth at bedtime.   . furosemide (LASIX) 20 MG tablet TAKE 1 TABLET (20 MG TOTAL) BY MOUTH AS NEEDED FOR EDEMA.  . metFORMIN (GLUCOPHAGE-XR) 500 MG 24 hr tablet SMARTSIG:1 Pill By Mouth Daily  . metoprolol succinate (TOPROL-XL) 50 MG 24 hr tablet Take 100 mg by mouth 2 (two) times daily. Take with or immediately following a meal.   . pregabalin (LYRICA) 50 MG capsule Take 50 mg by mouth 2 (two) times daily.  . RABEprazole (ACIPHEX) 20 MG tablet Take 1 tablet (20 mg total) by mouth 2 (two) times daily.  . rizatriptan (MAXALT) 10 MG tablet Take 10 mg by mouth as  needed for migraine. May repeat in 2 hours if needed   . tamsulosin (FLOMAX) 0.4 MG CAPS capsule Take 0.4 mg by mouth at bedtime.   . traMADol (ULTRAM) 50 MG tablet Take 100 mg by mouth 2 (two) times daily as needed.  . TRESIBA FLEXTOUCH 100 UNIT/ML FlexTouch Pen SMARTSIG:25 Unit(s) SUB-Q Daily  . TRULICITY 1.5 0000000 SOPN SMARTSIG:0.5 Milliliter(s) SUB-Q Once a Week  . valsartan (DIOVAN) 320 MG tablet Take 320 mg by mouth daily.  Marland Kitchen venlafaxine XR (EFFEXOR-XR) 150 MG 24 hr capsule Take 150 mg by mouth 2 (two) times daily.  . zaleplon (SONATA) 5 MG capsule Take 5 mg by mouth at bedtime.     Allergies:   Aspirin, Codeine, Naproxen, and Sulfonamide derivatives   Social History   Tobacco Use  . Smoking status: Never Smoker  . Smokeless tobacco: Never Used  Substance Use Topics  . Alcohol use: No  . Drug use: No     Family Hx: The patient's family history includes Breast cancer in her sister; Colon cancer in her maternal uncle; Colon polyps in her father; Diabetes in her maternal aunt; Heart disease in her father, maternal uncle, and another family member; Irritable bowel syndrome in her daughter; Ovarian cancer in her maternal aunt; Stomach cancer in her maternal aunt.  ROS:   Please see the history of present  illness.     All other systems reviewed and are negative.   Prior CV studies:   The following studies were reviewed today:  Coronary CT 09/06/2016 Aorta: Normal size. Mild diffuse calcifications in the aortic arch. No dissection.  Aortic Valve:  Trileaflet.  No calcifications.  Coronary Arteries:  Normal coronary origin.  Right dominance.  RCA is a large dominant artery that gives rise to PDA and PLVB. There is minimal diffuse calcified plaque in the proximal and mid RCA with stenosis 0-25%. No plaque in PDA and PLA.  Left main is a very large and long artery that gives rise to LAD and LCX arteries. There is no plaque.  LAD is a large vessel that gives rise to one diagonal artery. There is mild calcified plaque in the mid LAD with associated stenosis 25-50%. There is another minimal calcified plaque in the mid LAD with stenosis 0-25%. No plaque in the diagonal branch.  LCX is a non-dominant artery that gives rise to one large OM1 branch. There is no plaque.  Other findings:  Normal pulmonary vein drainage into the left atrium.  Normal let atrial appendage without a thrombus.  There is mitral annular calcification.  IMPRESSION: 1. Coronary calcium score of 90.  2. Normal coronary origin with right dominance.  3. There is mild diffuse non-obstructive plaque in RCA and LAD. An aggressive risk factor modification is recommended.  Labs/Other Tests and Data Reviewed:    EKG:  An ECG dated 07/21/2019 was personally reviewed today and demonstrated:  Normal sinus rhythm with poor R wave progression in anterior leads.  Recent Labs: No results found for requested labs within last 8760 hours.   Recent Lipid Panel Lab Results  Component Value Date/Time   CHOL 142 10/23/2016 11:15 AM   TRIG 109 10/23/2016 11:15 AM   HDL 52 10/23/2016 11:15 AM   CHOLHDL 2.7 10/23/2016 11:15 AM   LDLCALC 68 10/23/2016 11:15 AM    Wt Readings from Last 3 Encounters:    07/21/19 184 lb 9.6 oz (83.7 kg)  03/17/19 181 lb 3.2 oz (82.2 kg)  09/16/18 185 lb (83.9 kg)  Objective:    Vital Signs:  BP 137/73   Pulse 70   Ht 5\' 5"  (1.651 m)   BMI 30.72 kg/m    VITAL SIGNS:  reviewed  ASSESSMENT & PLAN:    1. CAD: Previous coronary CT obtained in 2018 showed mild plaque in the RCA and LAD territory.  Patient denies any recent chest discomfort.  Continue Plavix and Lipitor  2. Hypertension: Blood pressure well controlled on her therapy  3. Hyperlipidemia: Continue Lipitor 20 mg daily  4. DM2: Managed by primary care provider.  COVID-19 Education: The signs and symptoms of COVID-19 were discussed with the patient and how to seek care for testing (follow up with PCP or arrange E-visit).  The importance of social distancing was discussed today.  Time:   Today, I have spent 18 minutes with the patient with telehealth technology discussing the above problems.     Medication Adjustments/Labs and Tests Ordered: Current medicines are reviewed at length with the patient today.  Concerns regarding medicines are outlined above.   Tests Ordered: No orders of the defined types were placed in this encounter.   Medication Changes: No orders of the defined types were placed in this encounter.   Follow Up:  Either In Person or Virtual in 6 month(s)  Signed, Almyra Deforest, [place]  10/21/2019 12:24 PM    [facility]

## 2019-12-05 ENCOUNTER — Other Ambulatory Visit: Payer: Self-pay | Admitting: Neurology

## 2019-12-29 ENCOUNTER — Other Ambulatory Visit: Payer: Self-pay | Admitting: Neurology

## 2020-07-19 ENCOUNTER — Encounter: Payer: Self-pay | Admitting: Neurology

## 2020-07-19 ENCOUNTER — Ambulatory Visit (INDEPENDENT_AMBULATORY_CARE_PROVIDER_SITE_OTHER): Payer: Medicare Other | Admitting: Neurology

## 2020-07-19 ENCOUNTER — Other Ambulatory Visit: Payer: Self-pay

## 2020-07-19 VITALS — BP 152/75 | HR 69 | Ht 65.0 in | Wt 179.0 lb

## 2020-07-19 DIAGNOSIS — E1142 Type 2 diabetes mellitus with diabetic polyneuropathy: Secondary | ICD-10-CM | POA: Diagnosis not present

## 2020-07-19 DIAGNOSIS — R413 Other amnesia: Secondary | ICD-10-CM

## 2020-07-19 DIAGNOSIS — G959 Disease of spinal cord, unspecified: Secondary | ICD-10-CM | POA: Diagnosis not present

## 2020-07-19 NOTE — Progress Notes (Signed)
PATIENT: Alexis [name] DOB: [birth year]  REASON FOR VISIT: follow up HISTORY FROM: patient  HISTORY OF PRESENT ILLNESS: Today 07/19/20 Alexis [name] is a 69 year old female with history of diabetes and diabetic peripheral neuropathy.  History of cervical spine surgery, has had spinal cord compression with cervical myelopathy.  Has some SVD and gait disorder.  Has had 2 falls since last seen, 1 trying in climb into a truck, the other, tripped over a blanket. She has to hold on when going on stairs. Of most concern, more trouble with memory, organization, word finding, pronouncing words.  Her husband gets frustrated with her.  She feels her puzzles and solitaire help the memory.  Recent A1c was 9.  Has underlying depression, reportedly well controlled.  Has sleep apnea, but does not use CPAP, sleeps well she thinks.  She does her own ADLs, she drives.  If she is active on a particular day, she pays for it the next day, but seems to rebound if she elevates her legs.  No assistive device.  Here today for follow-up unaccompanied.  MMSE 29/30 today.  HISTORY 09/16/2019 Dr. Jannifer Franklin: Alexis [name] is a 69 year old right-handed white female with a history of diabetes and diabetic peripheral neuropathy.  She has a history of cervical spine surgery, she had spinal cord compression with cervical myelopathy.  She does have some degree of small vessel disease and does have a gait disorder.  The patient has not had recent falls.  She reports some difficulty with memory, but has been relatively stable over time.  She is not on any medications for memory.  She has had some problems with blood sugar control, her hemoglobin A1c within the last 6 months was 12.  She is trying to do better more recently with this.  The patient is doing puzzles regularly which she thinks helps her cognitive abilities.  She is not sleeping well, she will wake up every 4 hours or so but reports a fairly good energy level during the day.   The patient is doing most of her activities of daily living without difficulty, she is not driving much, at times she has some problems orienting herself to where she is going.  She has difficulty with cooking, trying to put together a larger meal.  She has difficulty multitasking.  She at times has trouble with word finding.  There has not been much progression since last seen.   REVIEW OF SYSTEMS: Out of a complete 14 system review of symptoms, the patient complains only of the following symptoms, and all other reviewed systems are negative.  Memory difficulty, walking difficulty  ALLERGIES: Allergies  Allergen Reactions  . Aspirin Other (See Comments)    Reaction: Vasculitis per MD  . Codeine Itching and Rash  . Naproxen Nausea Only    Reaction: Makes stomach upset/irritated.  . Sulfonamide Derivatives Itching and Rash    HOME MEDICATIONS: Outpatient Medications Prior to Visit  Medication Sig Dispense Refill  . acetaminophen (TYLENOL) 500 MG tablet Take 500 mg by mouth every 6 (six) hours as needed for moderate pain or headache.     . ALPRAZolam (XANAX) 0.5 MG tablet Take 0.5-1 mg by mouth at bedtime as needed for anxiety.     Marland Kitchen amLODipine (NORVASC) 5 MG tablet Take 5 mg by mouth daily.    Marland Kitchen atorvastatin (LIPITOR) 20 MG tablet Take 20 mg by mouth daily.    Marland Kitchen b complex vitamins tablet Take 1 tablet by mouth daily.    Marland Kitchen  clopidogrel (PLAVIX) 75 MG tablet TAKE 1 TABLET BY MOUTH EVERY DAY 90 tablet 3  . cyanocobalamin 1000 MCG tablet Vitamin B-12  1,000 mcg tablet  Take by oral route.    Marland Kitchen FARXIGA 10 MG TABS tablet Take 10 mg by mouth daily.    . fish oil-omega-3 fatty acids 1000 MG capsule Take 1 g by mouth at bedtime.     . furosemide (LASIX) 20 MG tablet TAKE 1 TABLET (20 MG TOTAL) BY MOUTH AS NEEDED FOR EDEMA. 90 tablet 1  . metFORMIN (GLUCOPHAGE-XR) 500 MG 24 hr tablet SMARTSIG:1 Pill By Mouth Daily    . metoprolol succinate (TOPROL-XL) 50 MG 24 hr tablet Take 100 mg by mouth 2  (two) times daily. Take with or immediately following a meal.     . pregabalin (LYRICA) 50 MG capsule Take 50 mg by mouth 2 (two) times daily.    . RABEprazole (ACIPHEX) 20 MG tablet Take 1 tablet (20 mg total) by mouth 2 (two) times daily. 60 tablet 3  . rizatriptan (MAXALT) 10 MG tablet Take 10 mg by mouth as needed for migraine. May repeat in 2 hours if needed     . tamsulosin (FLOMAX) 0.4 MG CAPS capsule Take 0.4 mg by mouth at bedtime.   3  . traMADol (ULTRAM) 50 MG tablet Take 100 mg by mouth 2 (two) times daily as needed.    . TRESIBA FLEXTOUCH 100 UNIT/ML FlexTouch Pen SMARTSIG:25 Unit(s) SUB-Q Daily    . TRULICITY 1.5 JH/4.1DE SOPN SMARTSIG:0.5 Milliliter(s) SUB-Q Once a Week    . valsartan (DIOVAN) 320 MG tablet Take 320 mg by mouth daily.    Marland Kitchen venlafaxine XR (EFFEXOR-XR) 150 MG 24 hr capsule Take 150 mg by mouth 2 (two) times daily.    . zaleplon (SONATA) 5 MG capsule Take 5 mg by mouth at bedtime.     No facility-administered medications prior to visit.    PAST MEDICAL HISTORY: Past Medical History:  Diagnosis Date  . Anxiety   . Arthritis    "knees" (05/25/2014)  . Bursitis of knee    "both"  . Chronic back pain   . Depression   . Diabetes mellitus type II   . Diabetic peripheral neuropathy ([place]) 10/20/2018  . Diverticulitis   . Fatty liver   . Fibromyalgia   . Gastric polyp    hyperplastic  . Gastroparesis    "recently dx'd" (05/25/2014)  . GERD (gastroesophageal reflux disease)   . H/O hiatal hernia   . YCXKGYJE(563.1)    "monthly" (05/25/2014)  . Heart attack ([place]) 2014   "mild"  . Hx of gastritis   . Hyperlipidemia   . Hypertension   . Irritable bowel syndrome (IBS)   . Memory difficulties 09/18/2017  . Migraine without aura, without mention of intractable migraine without mention of status migrainosus    "related to allergies; have them in the spring and fall" (05/25/2014)  . Obesity   . Pancreatic cyst    peripancreatic cystic lesion  . Renal cyst    . Sleep apnea    "wore mask; took it off in my sleep; quit wearing it" (05/25/2014)  . Sprain of neck 03/18/2013  . Uterine cancer ([place]) dx'd 2000   surg only  . Vasculitis ([place])    "irritates my legs"  . Wears glasses     PAST SURGICAL HISTORY: Past Surgical History:  Procedure Laterality Date  . ANTERIOR CERVICAL DECOMPRESSION/DISCECTOMY FUSION 4 LEVELS N/A 05/07/2018   Procedure: ANTERIOR CERVICAL DECOMPRESSION  FUSION, CERVICAL 4-5,CERVICAL 5-6, CERVICAL 6-7 WITH INSTRUMENTATION AND ALLOGRAFT;  Surgeon: Phylliss Bob, MD;  Location: [facility];  Service: Orthopedics;  Laterality: N/A;  . APPENDECTOMY  ~ 1967  . BREAST CYST EXCISION Right 1990  . COLONOSCOPY    . LAPAROSCOPIC CHOLECYSTECTOMY  1990  . LEFT HEART CATHETERIZATION WITH CORONARY ANGIOGRAM N/A 05/26/2014   Procedure: LEFT HEART CATHETERIZATION WITH CORONARY ANGIOGRAM;  Surgeon: Troy Sine, MD;  Location[facility] CATH LAB;  Service: Cardiovascular;  Laterality: N/A;  . SINUS SURGERY WITH INSTATRAK  2000  . TUBAL LIGATION  ~ 1982  . VAGINAL HYSTERECTOMY  2000    FAMILY HISTORY: Family History  Problem Relation Age of Onset  . Breast cancer Sister   . Colon polyps Father   . Heart disease Father   . Colon cancer Maternal Uncle   . Ovarian cancer Maternal Aunt   . Stomach cancer Maternal Aunt   . Diabetes Maternal Aunt   . Heart disease Maternal Uncle   . Heart disease Other        Grandparents  . Irritable bowel syndrome Daughter     SOCIAL HISTORY: Social History   Socioeconomic History  . Marital status: Married    Spouse name: Not on file  . Number of children: 2  . Years of education: Not on file  . Highest education level: Not on file  Occupational History  . Occupation: Retired    Fish farm manager: RETIRED  Tobacco Use  . Smoking status: Never Smoker  . Smokeless tobacco: Never Used  Vaping Use  . Vaping Use: Never used  Substance and Sexual Activity  . Alcohol use: No  . Drug use: No  . Sexual  activity: Never  Other Topics Concern  . Not on file  Social History Narrative   Daily Caffeine, Coke   Social Determinants of Health   Financial Resource Strain:   . Difficulty of Paying Living Expenses: Not on file  Food Insecurity:   . Worried About Charity fundraiser in the Last Year: Not on file  . Ran Out of Food in the Last Year: Not on file  Transportation Needs:   . Lack of Transportation (Medical): Not on file  . Lack of Transportation (Non-Medical): Not on file  Physical Activity:   . Days of Exercise per Week: Not on file  . Minutes of Exercise per Session: Not on file  Stress:   . Feeling of Stress : Not on file  Social Connections:   . Frequency of Communication with Friends and Family: Not on file  . Frequency of Social Gatherings with Friends and Family: Not on file  . Attends Religious Services: Not on file  . Active Member of Clubs or Organizations: Not on file  . Attends Archivist Meetings: Not on file  . Marital Status: Not on file  Intimate Partner Violence:   . Fear of Current or Ex-Partner: Not on file  . Emotionally Abused: Not on file  . Physically Abused: Not on file  . Sexually Abused: Not on file   PHYSICAL EXAM  Vitals:   07/19/20 1512  BP: (!) 152/75  Pulse: 69  Weight: 179 lb (81.2 kg)  Height: 5\' 5"  (1.651 m)   Body mass index is 29.79 kg/m.  Generalized: Well developed, in no acute distress  MMSE - Mini Mental State Exam 07/19/2020 03/17/2019 09/18/2017  Orientation to time 5 5 5   Orientation to Place 5 5 5   Registration 3 3 3  Attention/ Calculation 5 4 5   Recall 2 3 3   Language- name 2 objects 2 2 2   Language- repeat 1 1 1   Language- follow 3 step command 3 3 3   Language- read & follow direction 1 1 1   Write a sentence 1 1 1   Copy design 1 1 1   Copy design-comments - named 9 animals -  Total score 29 29 30     Neurological examination  Mentation: Alert oriented to time, place, history taking. Follows all commands  speech and language fluent Cranial nerve II-XII: Pupils were equal round reactive to light. Extraocular movements were full, visual field were full on confrontational test. Facial sensation and strength were normal.  Head turning and shoulder shrug  were normal and symmetric. Motor: The motor testing reveals 5 over 5 strength of all 4 extremities. Good symmetric motor tone is noted throughout.  Sensory: Sensory testing is intact to soft touch on all 4 extremities. No evidence of extinction is noted.  Coordination: Cerebellar testing reveals good finger-nose-finger and heel-to-shin bilaterally.  Gait and station: Gait is normal. Tandem gait is unsteady. Romberg is negative. No drift is seen.  Reflexes: Deep tendon reflexes are symmetric and normal bilaterally.   DIAGNOSTIC DATA (LABS, IMAGING, TESTING) - I reviewed patient records, labs, notes, testing and imaging myself where available.  Lab Results  Component Value Date   WBC 8.7 05/07/2018   HGB 12.1 05/07/2018   HCT 37.8 05/07/2018   MCV 89.6 05/07/2018   PLT 217 05/07/2018      Component Value Date/Time   NA 139 05/07/2018 0642   K 3.4 (L) 05/07/2018 0642   CL 104 05/07/2018 0642   CO2 26 05/07/2018 0642   GLUCOSE 184 (H) 05/07/2018 0642   BUN 19 05/07/2018 0642   CREATININE 0.75 05/07/2018 0642   CALCIUM 8.9 05/07/2018 0642   PROT 6.4 03/17/2019 1214   ALBUMIN 3.5 05/07/2018 0642   AST 15 05/07/2018 0642   ALT 17 05/07/2018 0642   ALKPHOS 94 05/07/2018 0642   BILITOT 0.6 05/07/2018 0642   GFRNONAA >60 05/07/2018 0642   GFRAA >60 05/07/2018 0642   Lab Results  Component Value Date   CHOL 142 10/23/2016   HDL 52 10/23/2016   LDLCALC 68 10/23/2016   TRIG 109 10/23/2016   CHOLHDL 2.7 10/23/2016   Lab Results  Component Value Date   HGBA1C 9.4 (H) 05/07/2018   Lab Results  Component Value Date   VITAMINB12 907 03/17/2019   No results found for: TSH    ASSESSMENT AND PLAN 69 y.o. year old female  has a past  medical history of Anxiety, Arthritis, Bursitis of knee, Chronic back pain, Depression, Diabetes mellitus type II, Diabetic peripheral neuropathy ([place]) (10/20/2018), Diverticulitis, Fatty liver, Fibromyalgia, Gastric polyp, Gastroparesis, GERD (gastroesophageal reflux disease), H/O hiatal hernia, Headache(784.0), Heart attack ([place]) (2014), gastritis, Hyperlipidemia, Hypertension, Irritable bowel syndrome (IBS), Memory difficulties (09/18/2017), Migraine without aura, without mention of intractable migraine without mention of status migrainosus, Obesity, Pancreatic cyst, Renal cyst, Sleep apnea, Sprain of neck (03/18/2013), Uterine cancer ([place]) (dx'd 2000), Vasculitis ([place]), and Wears glasses. here with:  1.  Mild memory disturbance, mild cognitive impairment  2.  Gait disorder, multifactorial  3.  History of cervical myelopathy  4.  Diabetic peripheral neuropathy  Her memory remains overall stable.  We talked about memory medications, we will hold off for now.  We may consider formal neuropsychological evaluation for the memory issues in the future.  She does indicate her mother had  Alzheimer's disease.  She scored 29/30 on MMSE today.  I encouraged her to gain better control of her diabetes, recent A1c was 9.  Continue routine follow-up with PCP.  She will follow-up in 8 months or sooner if needed.  I spent 30 minutes of face-to-face and non-face-to-face time with patient.  This included previsit chart review, lab review, study review, order entry, electronic health record documentation, patient education.  Butler Denmark, AGNP-C, DNP 07/19/2020, 3:43 PM [facility] [address] [place], [place] [address] [phone]

## 2020-07-19 NOTE — Patient Instructions (Signed)
Memory is overall stable Continue your puzzles and games Continue seeing your primary doctor Be careful not to fall  See you back in 8 months

## 2020-07-19 NOTE — Progress Notes (Signed)
I have read the note, and I agree with the clinical assessment and plan.  Saroya Riccobono K Augusta Mirkin   

## 2020-11-07 ENCOUNTER — Encounter: Payer: Self-pay | Admitting: Physician Assistant

## 2020-11-07 ENCOUNTER — Ambulatory Visit (INDEPENDENT_AMBULATORY_CARE_PROVIDER_SITE_OTHER): Payer: Medicare Other | Admitting: Physician Assistant

## 2020-11-07 ENCOUNTER — Other Ambulatory Visit: Payer: Self-pay

## 2020-11-07 VITALS — BP 132/68 | HR 72 | Ht 65.0 in | Wt 184.6 lb

## 2020-11-07 DIAGNOSIS — Z01810 Encounter for preprocedural cardiovascular examination: Secondary | ICD-10-CM | POA: Diagnosis not present

## 2020-11-07 DIAGNOSIS — E119 Type 2 diabetes mellitus without complications: Secondary | ICD-10-CM

## 2020-11-07 DIAGNOSIS — R072 Precordial pain: Secondary | ICD-10-CM | POA: Diagnosis not present

## 2020-11-07 DIAGNOSIS — E785 Hyperlipidemia, unspecified: Secondary | ICD-10-CM | POA: Diagnosis not present

## 2020-11-07 DIAGNOSIS — I10 Essential (primary) hypertension: Secondary | ICD-10-CM | POA: Diagnosis not present

## 2020-11-07 DIAGNOSIS — I2510 Atherosclerotic heart disease of native coronary artery without angina pectoris: Secondary | ICD-10-CM

## 2020-11-07 NOTE — Patient Instructions (Addendum)
Medication Instructions:  Your physician recommends that you continue on your current medications as directed. Please refer to the Current Medication list given to you today.  *If you need a refill on your cardiac medications before your next appointment, please call your pharmacy*  Lab Work: Your physician recommends that you return for lab work 1 week prior to test:   BMET  If you have labs (blood work) drawn today and your tests are completely normal, you will receive your results only by: Marland Kitchen MyChart Message (if you have MyChart) OR . A paper copy in the mail If you have any lab test that is abnormal or we need to change your treatment, we will call you to review the results.  Testing/Procedures: Almyra Deforest, PA-C has ordered for you to have a Coronary CT Angiogram   Cardiac CT Angiogram A cardiac CT angiogram is a procedure to look at the heart and the area around the heart. It may be done to help find the cause of chest pains or other symptoms of heart disease. During this procedure, a substance called contrast dye is injected into the blood vessels in the area to be checked. A large X-ray machine, called a CT scanner, then takes detailed pictures of the heart and the surrounding area. The procedure is also sometimes called a coronary CT angiogram, coronary artery scanning, or CTA. A cardiac CT angiogram allows the health care provider to see how well blood is flowing to and from the heart. The health care provider will be able to see if there are any problems, such as:  Blockage or narrowing of the coronary arteries in the heart.  Fluid around the heart.  Signs of weakness or disease in the muscles, valves, and tissues of the heart. Tell a health care provider about:  Any allergies you have. This is especially important if you have had a previous allergic reaction to contrast dye.  All medicines you are taking, including vitamins, herbs, eye drops, creams, and over-the-counter  medicines.  Any blood disorders you have.  Any surgeries you have had.  Any medical conditions you have.  Whether you are pregnant or may be pregnant.  Any anxiety disorders, chronic pain, or other conditions you have that may increase your stress or prevent you from lying still. What are the risks? Generally, this is a safe procedure. However, problems may occur, including:  Bleeding.  Infection.  Allergic reactions to medicines or dyes.  Damage to other structures or organs.  Kidney damage from the contrast dye that is used.  Increased risk of cancer from radiation exposure. This risk is low. Talk with your health care provider about: ? The risks and benefits of testing. ? How you can receive the lowest dose of radiation. What happens before the procedure?  Wear comfortable clothing and remove any jewelry, glasses, dentures, and hearing aids.  Follow instructions from your health care provider about eating and drinking. This may include: ? For 12 hours before the procedure -- avoid caffeine. This includes tea, coffee, soda, energy drinks, and diet pills. Drink plenty of water or other fluids that do not have caffeine in them. Being well hydrated can prevent complications. ? For 4-6 hours before the procedure -- stop eating and drinking. The contrast dye can cause nausea, but this is less likely if your stomach is empty.  Ask your health care provider about changing or stopping your regular medicines. This is especially important if you are taking diabetes medicines, blood thinners, or  medicines to treat problems with erections (erectile dysfunction). What happens during the procedure?  Hair on your chest may need to be removed so that small sticky patches called electrodes can be placed on your chest. These will transmit information that helps to monitor your heart during the procedure.  An IV will be inserted into one of your veins.  You might be given a medicine to  control your heart rate during the procedure. This will help to ensure that good images are obtained.  You will be asked to lie on an exam table. This table will slide in and out of the CT machine during the procedure.  Contrast dye will be injected into the IV. You might feel warm, or you may get a metallic taste in your mouth.  You will be given a medicine called nitroglycerin. This will relax or dilate the arteries in your heart.  The table that you are lying on will move into the CT machine tunnel for the scan.  The person running the machine will give you instructions while the scans are being done. You may be asked to: ? Keep your arms above your head. ? Hold your breath. ? Stay very still, even if the table is moving.  When the scanning is complete, you will be moved out of the machine.  The IV will be removed. The procedure may vary among health care providers and hospitals.   What can I expect after the procedure? After your procedure, it is common to have:  A metallic taste in your mouth from the contrast dye.  A feeling of warmth.  A headache from the nitroglycerin. Follow these instructions at home:  Take over-the-counter and prescription medicines only as told by your health care provider.  If you are told, drink enough fluid to keep your urine pale yellow. This will help to flush the contrast dye out of your body.  Most people can return to their normal activities right after the procedure. Ask your health care provider what activities are safe for you.  It is up to you to get the results of your procedure. Ask your health care provider, or the department that is doing the procedure, when your results will be ready.  Keep all follow-up visits as told by your health care provider. This is important. Contact a health care provider if:  You have any symptoms of allergy to the contrast dye. These include: ? Shortness of breath. ? Rash or hives. ? A racing  heartbeat. Summary  A cardiac CT angiogram is a procedure to look at the heart and the area around the heart. It may be done to help find the cause of chest pains or other symptoms of heart disease.  During this procedure, a large X-ray machine, called a CT scanner, takes detailed pictures of the heart and the surrounding area after a contrast dye has been injected into blood vessels in the area.  Ask your health care provider about changing or stopping your regular medicines before the procedure. This is especially important if you are taking diabetes medicines, blood thinners, or medicines to treat erectile dysfunction.  If you are told, drink enough fluid to keep your urine pale yellow. This will help to flush the contrast dye out of your body. This information is not intended to replace advice given to you by your health care provider. Make sure you discuss any questions you have with your health care provider. Document Revised: 03/24/2019 Document Reviewed: 03/24/2019 Elsevier Patient  Education  2021 [place]: At [facility], you and your health needs are our priority.  As part of our continuing mission to provide you with exceptional heart care, we have created designated Provider Care Teams.  These Care Teams include your primary Cardiologist (physician) and Advanced Practice Providers (APPs -  Physician Assistants and Nurse Practitioners) who all work together to provide you with the care you need, when you need it.     Your next appointment:   12 month(s)  The format for your next appointment:   In Person  Provider:   Raliegh Ip Mali Hilty, MD  Other Instructions    Your cardiac CT will be scheduled at one of the below locations:   [facility] [address] [place], [place] [address] [phone]  [facility] [address] [place],  [address] [phone]  If scheduled at [facility], please arrive at the [facility] main entrance (entrance A) of [facility] 30 minutes prior to test start time. Proceed to the [facility] Radiology Department (first floor) to check-in and test prep.  If scheduled at [facility], please arrive 15 mins early for check-in and test prep.  Please follow these instructions carefully (unless otherwise directed):  Take 200 mg (2 tablets) Metoprolol Succinate (Toprol-XL) the morning of the test  On the Night Before the Test: . Be sure to Drink plenty of water. . Do not consume any caffeinated/decaffeinated beverages or chocolate 12 hours prior to your test. . Do not take any antihistamines 12 hours prior to your test.  On the Day of the Test: . Drink plenty of water until 1 hour prior to the test. . Do not eat any food 4 hours prior to the test. . You may take your regular medications prior to the test.  . Take metoprolol (Lopressor) two hours prior to test. . HOLD Furosemide/Hydrochlorothiazide morning of the test. . FEMALES- please wear underwire-free bra if available         -Drink plenty of water       -Hold Valsartan the morning of the test                  -If HR is less than 55 BPM- No Lopressor                -IF HR is greater than 55 BPM and patient is less than or equal to 48 yrs old Lopressor 100mg  x1.                -If HR is greater than 55 BPM and patient is greater than 37 yrs old Lopressor 50 mg x1.     Do not give Lopressor to patients with an allergy to lopressor or anyone with asthma or active COPD symptoms (currently taking steroids).       After the Test: . Drink plenty of water. . After receiving IV contrast, you may experience a mild flushed feeling. This is normal. . On occasion, you may experience a mild rash up to 24 hours after the test. This is not dangerous. If this occurs, you can take Benadryl 25 mg and increase your fluid intake. . If you experience trouble  breathing, this can be serious. If it is severe call 911 IMMEDIATELY. If it is mild, please call our office. . If you take any of these medications: Glipizide/Metformin, Avandament, Glucavance, please do not  take 48 hours after completing test unless otherwise instructed.   Once we have confirmed authorization from your insurance company, we will call you to set up a date and time for your test. Based on how quickly your insurance processes prior authorizations requests, please allow up to 4 weeks to be contacted for scheduling your Cardiac CT appointment. Be advised that routine Cardiac CT appointments could be scheduled as many as 8 weeks after your provider has ordered it.  For non-scheduling related questions, please contact the cardiac imaging nurse navigator should you have any questions/concerns: Marchia Bond, Cardiac Imaging Nurse Navigator Gordy Clement, Cardiac Imaging Nurse Navigator [facility] Direct Office Dial: [phone]   For scheduling needs, including cancellations and rescheduling, please call [place], [phone].

## 2020-11-07 NOTE — Progress Notes (Signed)
Cardiology Office Note:    Date:  11/09/2020   ID:  Alexis [name], DOB 1951/02/18, MRN [id number]  PCP:  Deland Pretty, [place]  Cardiologist:  Pixie Casino, MD  Advanced Practice Provider:  No care team member to display Electrophysiologist:  None   Referring MD: Deland Pretty, MD   Chief Complaint  Patient presents with  . Follow-up    Seen for Dr. Debara Pickett    History of Present Illness:    Alexis [name] is a 70 y.o. female with a hx of HLD,HTN, DM II, fatty liver disease, IBS,CAD, OSAand GERD. There was a past history of mild out of the hospital MI, however this was likely due to panic attack. There was no troponin evidence at the time due to her delayed presentation. Myoview since then has not shown any scar. In the past, she wore a heart monitor in 2004 due to episode ofsyncope, this demonstrated occasional unifocal PVCs, no sustained arrhythmia. Myoview obtained in June 2014 showed EF 63%, no evidence of previous infarct. In 2018, she wore a 48-hour Holter monitor that showed occasional PACs and PVCs as well as short run of SVT. She had a coronary CTA in 08/2016 that showed mild two-vessel disease with plaque in the LAD and RCA, coronary calcium score of 90. She also underwent repeat sleep study that demonstrated moderate obstructive sleep apnea. She was started on Farxiga for diabetes in the setting of coronary artery disease.Carotid Doppler obtained in August 2019 showed mild disease bilaterally.  She has been seen by Dr. Jannifer Franklin of neurology for mild memory disturbance.   Patient presents today for cardiology office visit.  For the past several weeks, she has been noticing left-sided chest pain.  The symptom does not have clear correlation with exertion.  On exam, she appears to be euvolemic.  She does have bilateral leg weakness as result of back pain, she says she has a history of spinal stenosis.  Her EKG is unchanged when  compared to the previous EKG.  I recommend a repeat coronary CT, if negative, she can follow-up in 1 year.  In the morning of the coronary CT, she can hold her valsartan and double up on the dose of metoprolol in order to decrease the heart rate enough to get a good picture.   Past Medical History:  Diagnosis Date  . Anxiety   . Arthritis    "knees" (05/25/2014)  . Bursitis of knee    "both"  . Chronic back pain   . Depression   . Diabetes mellitus type II   . Diabetic peripheral neuropathy ([place]) 10/20/2018  . Diverticulitis   . Fatty liver   . Fibromyalgia   . Gastric polyp    hyperplastic  . Gastroparesis    "recently dx'd" (05/25/2014)  . GERD (gastroesophageal reflux disease)   . H/O hiatal hernia   . ATFTDDUK(025.4)    "monthly" (05/25/2014)  . Heart attack ([place]) 2014   "mild"  . Hx of gastritis   . Hyperlipidemia   . Hypertension   . Irritable bowel syndrome (IBS)   . Memory difficulties 09/18/2017  . Migraine without aura, without mention of intractable migraine without mention of status migrainosus    "related to allergies; have them in the spring and fall" (05/25/2014)  . Obesity   . Pancreatic cyst    peripancreatic cystic lesion  . Renal cyst   . Sleep apnea    "wore mask; took it  off in my sleep; quit wearing it" (05/25/2014)  . Sprain of neck 03/18/2013  . Uterine cancer ([place]) dx'd 2000   surg only  . Vasculitis ([place])    "irritates my legs"  . Wears glasses     Past Surgical History:  Procedure Laterality Date  . ANTERIOR CERVICAL DECOMPRESSION/DISCECTOMY FUSION 4 LEVELS N/A 05/07/2018   Procedure: ANTERIOR CERVICAL DECOMPRESSION FUSION, CERVICAL 4-5,CERVICAL 5-6, CERVICAL 6-7 WITH INSTRUMENTATION AND ALLOGRAFT;  Surgeon: Phylliss Bob, MD;  Location: [place];  Service: Orthopedics;  Laterality: N/A;  . APPENDECTOMY  ~ 1967  . BREAST CYST EXCISION Right 1990  . COLONOSCOPY    . LAPAROSCOPIC CHOLECYSTECTOMY  1990  . LEFT HEART CATHETERIZATION WITH CORONARY  ANGIOGRAM N/A 05/26/2014   Procedure: LEFT HEART CATHETERIZATION WITH CORONARY ANGIOGRAM;  Surgeon: Troy Sine, MD;  Location: [facility] CATH LAB;  Service: Cardiovascular;  Laterality: N/A;  . SINUS SURGERY WITH INSTATRAK  2000  . TUBAL LIGATION  ~ 1982  . VAGINAL HYSTERECTOMY  2000    Current Medications: Current Meds  Medication Sig  . acetaminophen (TYLENOL) 500 MG tablet Take 500 mg by mouth every 6 (six) hours as needed for moderate pain or headache.   . ALPRAZolam (XANAX) 0.5 MG tablet Take 0.5-1 mg by mouth at bedtime as needed for anxiety.   [place] Kitchen amLODipine (NORVASC) 5 MG tablet Take 5 mg by mouth daily.  [place] Kitchen atorvastatin (LIPITOR) 20 MG tablet Take 20 mg by mouth daily.  [place] Kitchen b complex vitamins tablet Take 1 tablet by mouth daily.  . clopidogrel (PLAVIX) 75 MG tablet TAKE 1 TABLET BY MOUTH EVERY DAY  . cyanocobalamin 1000 MCG tablet Vitamin B-12  1,000 mcg tablet  Take by oral route.  [place] Kitchen FARXIGA 10 MG TABS tablet Take 10 mg by mouth daily.  . fish oil-omega-3 fatty acids 1000 MG capsule Take 1 g by mouth at bedtime.   . furosemide (LASIX) 20 MG tablet TAKE 1 TABLET (20 MG TOTAL) BY MOUTH AS NEEDED FOR EDEMA.  . metFORMIN (GLUCOPHAGE-XR) 500 MG 24 hr tablet SMARTSIG:1 Pill By Mouth Daily  . metoprolol succinate (TOPROL-XL) 50 MG 24 hr tablet Take 100 mg by mouth 2 (two) times daily. Take with or immediately following a meal.  . pregabalin (LYRICA) 50 MG capsule Take 50 mg by mouth 2 (two) times daily.  . RABEprazole (ACIPHEX) 20 MG tablet Take 1 tablet (20 mg total) by mouth 2 (two) times daily.  . rizatriptan (MAXALT) 10 MG tablet Take 10 mg by mouth as needed for migraine. May repeat in 2 hours if needed  . tamsulosin (FLOMAX) 0.4 MG CAPS capsule Take 0.4 mg by mouth at bedtime.   . traMADol (ULTRAM) 50 MG tablet Take 100 mg by mouth 2 (two) times daily as needed.  . TRESIBA FLEXTOUCH 100 UNIT/ML FlexTouch Pen SMARTSIG:25 Unit(s) SUB-Q Daily  . TRULICITY 1.5 IE/3.3IR SOPN SMARTSIG:0.5  Milliliter(s) SUB-Q Once a Week  . valsartan (DIOVAN) 320 MG tablet Take 320 mg by mouth daily.  [place] Kitchen venlafaxine XR (EFFEXOR-XR) 150 MG 24 hr capsule Take 150 mg by mouth 2 (two) times daily.  . zaleplon (SONATA) 5 MG capsule Take 5 mg by mouth at bedtime.     Allergies:   Aspirin, Codeine, Naproxen, and Sulfonamide derivatives   Social History   Socioeconomic History  . Marital status: Married    Spouse name: Not on file  . Number of children: 2  . Years of education: Not on file  . Highest education level:  Not on file  Occupational History  . Occupation: Retired    Fish farm manager: RETIRED  Tobacco Use  . Smoking status: Never Smoker  . Smokeless tobacco: Never Used  Vaping Use  . Vaping Use: Never used  Substance and Sexual Activity  . Alcohol use: No  . Drug use: No  . Sexual activity: Never  Other Topics Concern  . Not on file  Social History Narrative   Daily Caffeine, Coke   Social Determinants of Health   Financial Resource Strain: Not on file  Food Insecurity: Not on file  Transportation Needs: Not on file  Physical Activity: Not on file  Stress: Not on file  Social Connections: Not on file     Family History: The patient's family history includes Breast cancer in her sister; Colon cancer in her maternal uncle; Colon polyps in her father; Diabetes in her maternal aunt; Heart disease in her father, maternal uncle, and another family member; Irritable bowel syndrome in her daughter; Ovarian cancer in her maternal aunt; Stomach cancer in her maternal aunt.  ROS:   Please see the history of present illness.    All other systems reviewed and are negative.  EKGs/Labs/Other Studies Reviewed:    The following studies were reviewed today:  Coronary CT 09/06/2016 Aortic Valve:  Trileaflet.  No calcifications.  Coronary Arteries:  Normal coronary origin.  Right dominance.  RCA is a large dominant artery that gives rise to PDA and PLVB. There is minimal diffuse  calcified plaque in the proximal and mid RCA with stenosis 0-25%. No plaque in PDA and PLA.  Left main is a very large and long artery that gives rise to LAD and LCX arteries. There is no plaque.  LAD is a large vessel that gives rise to one diagonal artery. There is mild calcified plaque in the mid LAD with associated stenosis 25-50%. There is another minimal calcified plaque in the mid LAD with stenosis 0-25%. No plaque in the diagonal branch.  LCX is a non-dominant artery that gives rise to one large OM1 branch. There is no plaque.  Other findings:  Normal pulmonary vein drainage into the left atrium.  Normal let atrial appendage without a thrombus.  There is mitral annular calcification.  IMPRESSION: 1. Coronary calcium score of 90.  2. Normal coronary origin with right dominance.  3. There is mild diffuse non-obstructive plaque in RCA and LAD. An aggressive risk factor modification is recommended.  EKG:  EKG is ordered today.  The ekg ordered today demonstrates normal sinus rhythm, poor R wave progression in the anterior leads.  Recent Labs: No results found for requested labs within last 8760 hours.  Recent Lipid Panel    Component Value Date/Time   CHOL 142 10/23/2016 1115   TRIG 109 10/23/2016 1115   HDL 52 10/23/2016 1115   CHOLHDL 2.7 10/23/2016 1115   VLDL 22 10/23/2016 1115   LDLCALC 68 10/23/2016 1115     Risk Assessment/Calculations:       Physical Exam:    VS:  BP 132/68 (BP Location: Left Arm, Patient Position: Sitting, Cuff Size: Large)   Pulse 72   Ht 5\' 5"  (1.651 m)   Wt 184 lb 9.6 oz (83.7 kg)   SpO2 95%   BMI 30.72 kg/m     Wt Readings from Last 3 Encounters:  11/07/20 184 lb 9.6 oz (83.7 kg)  07/19/20 179 lb (81.2 kg)  07/21/19 184 lb 9.6 oz (83.7 kg)     GEN:  Well  nourished, well developed in no acute distress HEENT: Normal NECK: No JVD; No carotid bruits LYMPHATICS: No lymphadenopathy CARDIAC: RRR, no murmurs,  rubs, gallops RESPIRATORY:  Clear to auscultation without rales, wheezing or rhonchi  ABDOMEN: Soft, non-tender, non-distended MUSCULOSKELETAL:  No edema; No deformity  SKIN: Warm and dry NEUROLOGIC:  Alert and oriented x 3 PSYCHIATRIC:  Normal affect   ASSESSMENT:    1. Precordial pain   2. Preprocedural cardiovascular examination   3. Essential hypertension   4. Hyperlipidemia LDL goal <70   5. Controlled type 2 diabetes mellitus without complication, without long-term current use of insulin ([place])   6. Coronary artery disease involving native coronary artery of native heart without angina pectoris    PLAN:    In order of problems listed above:  1. Precordial chest pain: Patient has been having intermittent chest pain recently.  I recommend a repeat coronary CT to further assess  2. Preop clearance: Patient may require back procedure in the future.  As long as coronary CT does not show severe coronary artery disease, she will be cleared to hold the Plavix for 7 days prior to the surgery and restart as soon as possible afterward.  3. Hypertension: Blood pressure stable  4. Hyperlipidemia: On Lipitor  5. DM2: Managed by primary care provider  6. CAD: No obstructive two-vessel disease see previous coronary CT in 2018, given recent chest pain, will repeat coronary CT.   Medication Adjustments/Labs and Tests Ordered: Current medicines are reviewed at length with the patient today.  Concerns regarding medicines are outlined above.  Orders Placed This Encounter  Procedures  . CT CORONARY MORPH W/CTA COR W/SCORE W/CA W/CM &/OR WO/CM  . CT CORONARY FRACTIONAL FLOW RESERVE DATA PREP  . CT CORONARY FRACTIONAL FLOW RESERVE FLUID ANALYSIS  . Basic metabolic panel  . EKG 12-Lead   No orders of the defined types were placed in this encounter.   Patient Instructions   Medication Instructions:  Your physician recommends that you continue on your current medications as directed.  Please refer to the Current Medication list given to you today.  *If you need a refill on your cardiac medications before your next appointment, please call your pharmacy*  Lab Work: Your physician recommends that you return for lab work 1 week prior to test:   BMET  If you have labs (blood work) drawn today and your tests are completely normal, you will receive your results only by: [place] Kitchen MyChart Message (if you have MyChart) OR . A paper copy in the mail If you have any lab test that is abnormal or we need to change your treatment, we will call you to review the results.  Testing/Procedures: Almyra Deforest, PA-C has ordered for you to have a Coronary CT Angiogram   Cardiac CT Angiogram A cardiac CT angiogram is a procedure to look at the heart and the area around the heart. It may be done to help find the cause of chest pains or other symptoms of heart disease. During this procedure, a substance called contrast dye is injected into the blood vessels in the area to be checked. A large X-ray machine, called a CT scanner, then takes detailed pictures of the heart and the surrounding area. The procedure is also sometimes called a coronary CT angiogram, coronary artery scanning, or CTA. A cardiac CT angiogram allows the health care provider to see how well blood is flowing to and from the heart. The health care provider will be able to see  if there are any problems, such as:  Blockage or narrowing of the coronary arteries in the heart.  Fluid around the heart.  Signs of weakness or disease in the muscles, valves, and tissues of the heart. Tell a health care provider about:  Any allergies you have. This is especially important if you have had a previous allergic reaction to contrast dye.  All medicines you are taking, including vitamins, herbs, eye drops, creams, and over-the-counter medicines.  Any blood disorders you have.  Any surgeries you have had.  Any medical conditions you  have.  Whether you are pregnant or may be pregnant.  Any anxiety disorders, chronic pain, or other conditions you have that may increase your stress or prevent you from lying still. What are the risks? Generally, this is a safe procedure. However, problems may occur, including:  Bleeding.  Infection.  Allergic reactions to medicines or dyes.  Damage to other structures or organs.  Kidney damage from the contrast dye that is used.  Increased risk of cancer from radiation exposure. This risk is low. Talk with your health care provider about: ? The risks and benefits of testing. ? How you can receive the lowest dose of radiation. What happens before the procedure?  Wear comfortable clothing and remove any jewelry, glasses, dentures, and hearing aids.  Follow instructions from your health care provider about eating and drinking. This may include: ? For 12 hours before the procedure -- avoid caffeine. This includes tea, coffee, soda, energy drinks, and diet pills. Drink plenty of water or other fluids that do not have caffeine in them. Being well hydrated can prevent complications. ? For 4-6 hours before the procedure -- stop eating and drinking. The contrast dye can cause nausea, but this is less likely if your stomach is empty.  Ask your health care provider about changing or stopping your regular medicines. This is especially important if you are taking diabetes medicines, blood thinners, or medicines to treat problems with erections (erectile dysfunction). What happens during the procedure?  Hair on your chest may need to be removed so that small sticky patches called electrodes can be placed on your chest. These will transmit information that helps to monitor your heart during the procedure.  An IV will be inserted into one of your veins.  You might be given a medicine to control your heart rate during the procedure. This will help to ensure that good images are obtained.  You  will be asked to lie on an exam table. This table will slide in and out of the CT machine during the procedure.  Contrast dye will be injected into the IV. You might feel warm, or you may get a metallic taste in your mouth.  You will be given a medicine called nitroglycerin. This will relax or dilate the arteries in your heart.  The table that you are lying on will move into the CT machine tunnel for the scan.  The person running the machine will give you instructions while the scans are being done. You may be asked to: ? Keep your arms above your head. ? Hold your breath. ? Stay very still, even if the table is moving.  When the scanning is complete, you will be moved out of the machine.  The IV will be removed. The procedure may vary among health care providers and hospitals.   What can I expect after the procedure? After your procedure, it is common to have:  A metallic taste in  your mouth from the contrast dye.  A feeling of warmth.  A headache from the nitroglycerin. Follow these instructions at home:  Take over-the-counter and prescription medicines only as told by your health care provider.  If you are told, drink enough fluid to keep your urine pale yellow. This will help to flush the contrast dye out of your body.  Most people can return to their normal activities right after the procedure. Ask your health care provider what activities are safe for you.  It is up to you to get the results of your procedure. Ask your health care provider, or the department that is doing the procedure, when your results will be ready.  Keep all follow-up visits as told by your health care provider. This is important. Contact a health care provider if:  You have any symptoms of allergy to the contrast dye. These include: ? Shortness of breath. ? Rash or hives. ? A racing heartbeat. Summary  A cardiac CT angiogram is a procedure to look at the heart and the area around the heart. It  may be done to help find the cause of chest pains or other symptoms of heart disease.  During this procedure, a large X-ray machine, called a CT scanner, takes detailed pictures of the heart and the surrounding area after a contrast dye has been injected into blood vessels in the area.  Ask your health care provider about changing or stopping your regular medicines before the procedure. This is especially important if you are taking diabetes medicines, blood thinners, or medicines to treat erectile dysfunction.  If you are told, drink enough fluid to keep your urine pale yellow. This will help to flush the contrast dye out of your body. This information is not intended to replace advice given to you by your health care provider. Make sure you discuss any questions you have with your health care provider. Document Revised: 03/24/2019 Document Reviewed: 03/24/2019 Elsevier Patient Education  2021 [place]: At [facility], you and your health needs are our priority.  As part of our continuing mission to provide you with exceptional heart care, we have created designated Provider Care Teams.  These Care Teams include your primary Cardiologist (physician) and Advanced Practice Providers (APPs -  Physician Assistants and Nurse Practitioners) who all work together to provide you with the care you need, when you need it.     Your next appointment:   12 month(s)  The format for your next appointment:   In Person  Provider:   Raliegh Ip Mali Hilty, MD  Other Instructions    Your cardiac CT will be scheduled at one of the below locations:   [facility] [address] [place], [place] [address] [phone]  [place] [address] [place], [place] [address] [phone]  If scheduled at [facility], please arrive at the [facility] main entrance (entrance A) of [facility] 30 minutes prior  to test start time. Proceed to the [facility] Radiology Department (first floor) to check-in and test prep.  If scheduled at [facility], please arrive 15 mins early for check-in and test prep.  Please follow these instructions carefully (unless otherwise directed):  Take 200 mg (2 tablets) Metoprolol Succinate (Toprol-XL) the morning of the test  On the Night Before the Test: . Be sure to Drink plenty of water. . Do not consume any caffeinated/decaffeinated beverages or chocolate  12 hours prior to your test. . Do not take any antihistamines 12 hours prior to your test.  On the Day of the Test: . Drink plenty of water until 1 hour prior to the test. . Do not eat any food 4 hours prior to the test. . You may take your regular medications prior to the test.  . Take metoprolol (Lopressor) two hours prior to test. . HOLD Furosemide/Hydrochlorothiazide morning of the test. . FEMALES- please wear underwire-free bra if available         -Drink plenty of water       -Hold Valsartan the morning of the test                  -If HR is less than 55 BPM- No Lopressor                -IF HR is greater than 55 BPM and patient is less than or equal to 88 yrs old Lopressor 100mg  x1.                -If HR is greater than 55 BPM and patient is greater than 50 yrs old Lopressor 50 mg x1.     Do not give Lopressor to patients with an allergy to lopressor or anyone with asthma or active COPD symptoms (currently taking steroids).       After the Test: . Drink plenty of water. . After receiving IV contrast, you may experience a mild flushed feeling. This is normal. . On occasion, you may experience a mild rash up to 24 hours after the test. This is not dangerous. If this occurs, you can take Benadryl 25 mg and increase your fluid intake. . If you experience trouble breathing, this can be serious. If it is severe call 911 IMMEDIATELY. If it is mild, please call our office. . If  you take any of these medications: Glipizide/Metformin, Avandament, Glucavance, please do not take 48 hours after completing test unless otherwise instructed.   Once we have confirmed authorization from your insurance company, we will call you to set up a date and time for your test. Based on how quickly your insurance processes prior authorizations requests, please allow up to 4 weeks to be contacted for scheduling your Cardiac CT appointment. Be advised that routine Cardiac CT appointments could be scheduled as many as 8 weeks after your provider has ordered it.  For non-scheduling related questions, please contact the cardiac imaging nurse navigator should you have any questions/concerns: Marchia Bond, Cardiac Imaging Nurse Navigator Gordy Clement, Cardiac Imaging Nurse Navigator [facility] Direct Office Dial: [phone]   For scheduling needs, including cancellations and rescheduling, please call [place], [phone].       Hilbert Corrigan, [place]  11/09/2020 11:08 AM    [place]

## 2020-11-08 ENCOUNTER — Telehealth: Payer: Self-pay | Admitting: Physician Assistant

## 2020-11-08 NOTE — Telephone Encounter (Signed)
New Message:     Pt says her procedure have been scheduled for 11-21-20 @ 11.00. She wants to know if she can come in and get her lab work on this Friday(11-10-20)?

## 2020-11-08 NOTE — Telephone Encounter (Signed)
Spoke with the patient and advised her that she could come by Friday for lab work.

## 2020-11-09 ENCOUNTER — Encounter: Payer: Self-pay | Admitting: Physician Assistant

## 2020-11-10 ENCOUNTER — Other Ambulatory Visit: Payer: Self-pay

## 2020-11-10 DIAGNOSIS — Z01810 Encounter for preprocedural cardiovascular examination: Secondary | ICD-10-CM

## 2020-11-11 LAB — BASIC METABOLIC PANEL
BUN/Creatinine Ratio: 16 (ref 12–28)
BUN: 12 mg/dL (ref 8–27)
CO2: 23 mmol/L (ref 20–29)
Calcium: 9.2 mg/dL (ref 8.7–10.3)
Chloride: 101 mmol/L (ref 96–106)
Creatinine, Ser: 0.75 mg/dL (ref 0.57–1.00)
Glucose: 216 mg/dL — ABNORMAL HIGH (ref 65–99)
Potassium: 4.1 mmol/L (ref 3.5–5.2)
Sodium: 142 mmol/L (ref 134–144)
eGFR: 86 mL/min/{1.73_m2} (ref >59)

## 2020-11-20 ENCOUNTER — Telehealth (HOSPITAL_COMMUNITY): Payer: Self-pay | Admitting: Emergency Medicine

## 2020-11-20 NOTE — Telephone Encounter (Signed)
Reaching out to patient to offer assistance regarding upcoming cardiac imaging study; pt verbalizes understanding of appt date/time, parking situation and where to check in, pre-test NPO status and medications ordered, and verified current allergies; name and call back number provided for further questions should they arise Sion Reinders RN Navigator Cardiac Imaging [facility] [phone] office [phone] cell 

## 2020-11-21 ENCOUNTER — Ambulatory Visit (HOSPITAL_COMMUNITY)
Admission: RE | Admit: 2020-11-21 | Discharge: 2020-11-21 | Disposition: A | Discharge status: Home / Self Care | Payer: Medicare Other | Source: Ambulatory Visit | Attending: Physician Assistant | Admitting: Physician Assistant

## 2020-11-21 ENCOUNTER — Other Ambulatory Visit: Payer: Self-pay

## 2020-11-21 DIAGNOSIS — R072 Precordial pain: Secondary | ICD-10-CM | POA: Insufficient documentation

## 2020-11-21 DIAGNOSIS — I700 Atherosclerosis of aorta: Secondary | ICD-10-CM

## 2020-11-21 MED ORDER — NITROGLYCERIN 0.4 MG SL SUBL
SUBLINGUAL_TABLET | SUBLINGUAL | Status: AC
  Administered 2020-11-21: 0.8 mg via SUBLINGUAL
  Filled 2020-11-21: qty 2

## 2020-11-21 MED ORDER — IOHEXOL 350 MG/ML SOLN
80.0000 mL | Freq: Once | INTRAVENOUS | Status: AC | PRN
  Administered 2020-11-21: 80 mL via INTRAVENOUS

## 2020-11-21 MED ORDER — NITROGLYCERIN 0.4 MG SL SUBL: 0.8000 mg | SUBLINGUAL_TABLET | Freq: Once | SUBLINGUAL | Status: AC | PRN

## 2020-12-01 ENCOUNTER — Ambulatory Visit
Admission: EM | Admit: 2020-12-01 | Discharge: 2020-12-01 | Disposition: A | Discharge status: Home / Self Care | Payer: Medicare Other

## 2020-12-01 ENCOUNTER — Other Ambulatory Visit: Payer: Self-pay

## 2020-12-01 DIAGNOSIS — M79605 Pain in left leg: Secondary | ICD-10-CM | POA: Diagnosis not present

## 2020-12-01 NOTE — ED Triage Notes (Signed)
Pt c/o severe pain to lt upper inner thigh since yesterday. Denies injury

## 2020-12-03 NOTE — ED Provider Notes (Signed)
[place]    CSN: [id number] Arrival date & time: 12/01/20  1841      History   Chief Complaint Chief Complaint  Patient presents with  . Leg Pain    HPI Alexis [name] is a 70 y.o. female.   Patient presenting today with left upper medial thigh/groin pain that started suddenly yesterday without any provoking incident. Sharp, deep pain that is worse with deep pressure or weight bearing but is still ambulatory. Denies any recent falls, new exercises, or other possible injury sources. Denies redness, swelling to area, bruising, numbness, tingling, weakness, low back pain beyond her baseline from arthritis and fibromyalgia. So far taking her home tramadol and tylenol with mild temporary relief. Concerned about blood clot but already taking daily plavix.      Past Medical History:  Diagnosis Date  . Anxiety   . Arthritis    "knees" (05/25/2014)  . Bursitis of knee    "both"  . Chronic back pain   . Depression   . Diabetes mellitus type II   . Diabetic peripheral neuropathy ([place]) 10/20/2018  . Diverticulitis   . Fatty liver   . Fibromyalgia   . Gastric polyp    hyperplastic  . Gastroparesis    "recently dx'd" (05/25/2014)  . GERD (gastroesophageal reflux disease)   . H/O hiatal hernia   . GHWEXHBZ(169.6)    "monthly" (05/25/2014)  . Heart attack ([place]) 2014   "mild"  . Hx of gastritis   . Hyperlipidemia   . Hypertension   . Irritable bowel syndrome (IBS)   . Memory difficulties 09/18/2017  . Migraine without aura, without mention of intractable migraine without mention of status migrainosus    "related to allergies; have them in the spring and fall" (05/25/2014)  . Obesity   . Pancreatic cyst    peripancreatic cystic lesion  . Renal cyst   . Sleep apnea    "wore mask; took it off in my sleep; quit wearing it" (05/25/2014)  . Sprain of neck 03/18/2013  . Uterine cancer ([place]) dx'd 2000   surg only  . Vasculitis ([place])    "irritates my legs"  . Wears  glasses     Patient Active Problem List   Diagnosis Date Noted  . Subcapital fracture of left femur ([place]) 12/04/2020  . Leukocytosis 12/04/2020  . Fall at home, initial encounter 12/04/2020  . Diabetic peripheral neuropathy ([place]) 10/20/2018  . Cervical myelopathy ([place]) 05/07/2018  . Type 2 diabetes mellitus ([place]) 02/18/2018  . Memory difficulties 09/18/2017  . Coronary artery calcification seen on computed tomography 04/09/2017  . Word finding difficulty 09/05/2016  . Cervicogenic headache 09/05/2016  . Palpitation 08/22/2016  . Bilateral leg edema 08/22/2016  . Obesity (BMI 30-39.9) 05/25/2014  . Precordial chest pain 05/25/2014  . Migraine without aura, without mention of intractable migraine without mention of status migrainosus 03/18/2013  . Chest pain 01/29/2013  . Constipation 02/24/2012  . DM (diabetes mellitus), type 2, uncontrolled ([place]) 08/24/2010  . Hyperlipidemia 08/24/2010  . ANXIETY 08/24/2010  . Anxiety and depression 08/24/2010  . Essential hypertension 08/24/2010  . GERD 08/24/2010  . IRRITABLE BOWEL SYNDROME 08/24/2010  . FATTY LIVER DISEASE 08/24/2010  . ARTHRITIS 08/24/2010  . Fibromyalgia 08/24/2010  . OSA (obstructive sleep apnea) 08/24/2010  . ABDOMINAL PAIN-RUQ 08/24/2010  . UTERINE CANCER, HX OF 08/24/2010  . GASTRITIS, HX OF 08/24/2010    Past Surgical History:  Procedure Laterality Date  . ANTERIOR CERVICAL DECOMPRESSION/DISCECTOMY FUSION 4 LEVELS N/A 05/07/2018  Procedure: ANTERIOR CERVICAL DECOMPRESSION FUSION, CERVICAL 4-5,CERVICAL 5-6, CERVICAL 6-7 WITH INSTRUMENTATION AND ALLOGRAFT;  Surgeon: Phylliss Bob, MD;  Location: [facility];  Service: Orthopedics;  Laterality: N/A;  . APPENDECTOMY  ~ 1967  . BREAST CYST EXCISION Right 1990  . COLONOSCOPY    . LAPAROSCOPIC CHOLECYSTECTOMY  1990  . LEFT HEART CATHETERIZATION WITH CORONARY ANGIOGRAM N/A 05/26/2014   Procedure: LEFT HEART CATHETERIZATION WITH CORONARY ANGIOGRAM;  Surgeon: Troy Sine,  MD;  Location: [facility] CATH LAB;  Service: Cardiovascular;  Laterality: N/A;  . SINUS SURGERY WITH INSTATRAK  2000  . TUBAL LIGATION  ~ 1982  . VAGINAL HYSTERECTOMY  2000    OB History    Gravida      Para      Term      Preterm      AB      Living  2     SAB      IAB      Ectopic      Multiple      Live Births               Home Medications    Prior to Admission medications   Medication Sig Start Date End Date Taking? Authorizing Provider  acetaminophen (TYLENOL) 500 MG tablet Take 500 mg by mouth every 6 (six) hours as needed for moderate pain or headache.     [provider]  ALPRAZolam Duanne Moron) 0.5 MG tablet Take 0.5-1 mg by mouth at bedtime as needed for anxiety.     [provider]  amLODipine (NORVASC) 5 MG tablet Take 5 mg by mouth at bedtime. 08/27/19   [provider]  atorvastatin (LIPITOR) 20 MG tablet Take 20 mg by mouth at bedtime.    [provider]  b complex vitamins tablet Take 1 tablet by mouth at bedtime.    [provider]  clopidogrel (PLAVIX) 75 MG tablet TAKE 1 TABLET BY MOUTH EVERY DAY Patient taking differently: Take 75 mg by mouth daily. 12/30/19   Kathrynn Ducking, MD  cyanocobalamin 1000 MCG tablet Take 1,000 mcg by mouth at bedtime.    [provider]  Dulaglutide 3 MG/0.5ML SOPN Inject 3 mg into the skin every Sunday. 10/19/19   [provider]  FARXIGA 10 MG TABS tablet Take 10 mg by mouth daily. 09/10/19   [provider]  fish oil-omega-3 fatty acids 1000 MG capsule Take 1 g by mouth at bedtime.     [provider]  furosemide (LASIX) 20 MG tablet TAKE 1 TABLET (20 MG TOTAL) BY MOUTH AS NEEDED FOR EDEMA. Patient taking differently: Take 20 mg by mouth daily as needed for edema. 08/20/19   Almyra Deforest, PA  metFORMIN (GLUCOPHAGE-XR) 500 MG 24 hr tablet Take 500 mg by mouth daily. 08/24/19   [provider]  metoprolol succinate (TOPROL-XL) 50 MG 24 hr tablet  Take 100 mg by mouth 2 (two) times daily. Take with or immediately following a meal.    [provider]  pregabalin (LYRICA) 50 MG capsule Take 50 mg by mouth 2 (two) times daily.    [provider]  RABEprazole (ACIPHEX) 20 MG tablet Take 1 tablet (20 mg total) by mouth 2 (two) times daily. 04/22/12   Lafayette Dragon, MD  rizatriptan (MAXALT) 10 MG tablet Take 10 mg by mouth as needed for migraine. May repeat in 2 hours if needed    [provider]  tamsulosin (FLOMAX) 0.4 MG CAPS  capsule Take 0.4 mg by mouth at bedtime.  07/17/16   [provider]  traMADol (ULTRAM) 50 MG tablet Take 100 mg by mouth 2 (two) times daily. 09/13/19   [provider]  TRESIBA FLEXTOUCH 100 UNIT/ML FlexTouch Pen Inject 25 Units into the skin daily. 09/29/19   [provider]  valsartan (DIOVAN) 320 MG tablet Take 320 mg by mouth daily. 09/10/19   [provider]  venlafaxine XR (EFFEXOR-XR) 150 MG 24 hr capsule Take 150 mg by mouth 2 (two) times daily.    [provider]  zaleplon (SONATA) 5 MG capsule Take 5 mg by mouth at bedtime.    [provider]    Family History Family History  Problem Relation Age of Onset  . Breast cancer Sister   . Colon polyps Father   . Heart disease Father   . Colon cancer Maternal Uncle   . Ovarian cancer Maternal Aunt   . Stomach cancer Maternal Aunt   . Diabetes Maternal Aunt   . Heart disease Maternal Uncle   . Heart disease Other        Grandparents  . Irritable bowel syndrome Daughter     Social History Social History   Tobacco Use  . Smoking status: Never Smoker  . Smokeless tobacco: Never Used  Vaping Use  . Vaping Use: Never used  Substance Use Topics  . Alcohol use: No  . Drug use: No     Allergies   Aspirin, Codeine, Naproxen, and Sulfonamide derivatives   Review of Systems Review of Systems PER HPI    Physical Exam Triage Vital Signs ED Triage Vitals  Enc Vitals Group      BP 12/01/20 1948 127/63     Pulse Rate 12/01/20 1948 71     Resp 12/01/20 1948 20     Temp 12/01/20 1948 98 F (36.7 C)     Temp Source 12/01/20 1948 Oral     SpO2 12/01/20 1948 94 %     Weight --      Height --      Head Circumference --      Peak Flow --      Pain Score 12/01/20 1950 5     Pain Loc --      Pain Edu? --      Excl. in [place]? --    No data found.  Updated Vital Signs BP 127/63 (BP Location: Left Arm)   Pulse 71   Temp 98 F (36.7 C) (Oral)   Resp 20   SpO2 94%   Visual Acuity Right Eye Distance:   Left Eye Distance:   Bilateral Distance:    Right Eye Near:   Left Eye Near:    Bilateral Near:     Physical Exam Vitals and nursing note reviewed.  Constitutional:      Appearance: Normal appearance. She is not ill-appearing.  HENT:     Head: Atraumatic.  Eyes:     Extraocular Movements: Extraocular movements intact.     Conjunctiva/sclera: Conjunctivae normal.  Cardiovascular:     Rate and Rhythm: Normal rate and regular rhythm.     Heart sounds: Normal heart sounds.  Pulmonary:     Effort: Pulmonary effort is normal.     Breath sounds: Normal breath sounds.  Musculoskeletal:        General: Tenderness present. No swelling, deformity or signs of injury. Normal range of motion.     Cervical back: Normal range of motion and  neck supple.     Comments: Mild to moderate ttp with deep palpation of left upper medial thigh, no reproduction of pain with ROM against resistance exercises left leg in all directions. Mildly antalgic gait but good ROM and weight bearing. No edema, erythema to area  Skin:    General: Skin is warm and dry.     Findings: No bruising or erythema.  Neurological:     Mental Status: She is alert and oriented to person, place, and time.     Comments: Left lower extremity neurovascularly intact  Psychiatric:        Mood and Affect: Mood normal.        Thought Content: Thought content normal.        Judgment: Judgment normal.       UC Treatments / Results  Labs (all labs ordered are listed, but only abnormal results are displayed) Labs Reviewed - No data to display  EKG   Radiology DG Chest 1 View  Result Date: 12/04/2020 CLINICAL DATA:  Fall EXAM: CHEST  1 VIEW COMPARISON:  05/25/2014 FINDINGS: Prominent heart size without heart failure. Lungs well aerated and clear. No infiltrate or effusion. Atherosclerotic aorta. ACDF cervical spine IMPRESSION: No active disease. Electronically Signed   By: Franchot Gallo M.D.   On: 12/04/2020 07:58   DG Lumbar Spine Complete  Result Date: 12/04/2020 CLINICAL DATA:  Fall EXAM: LUMBAR SPINE - COMPLETE 4+ VIEW COMPARISON:  None. FINDINGS: Negative for lumbar fracture Normal alignment. Mild scoliosis. Mild to moderate disc degeneration at L3-4. Atherosclerotic aorta. IMPRESSION: Negative for fracture. Electronically Signed   By: Franchot Gallo M.D.   On: 12/04/2020 07:56   DG Pelvis 1-2 Views  Result Date: 12/04/2020 CLINICAL DATA:  Fall EXAM: PELVIS - 1-2 VIEW COMPARISON:  None. FINDINGS: Subcapital fracture left femur with impaction. No significant displacement. Left hip joint normal. No other pelvic fracture identified. IMPRESSION: Subcapital fracture left femur Electronically Signed   By: Franchot Gallo M.D.   On: 12/04/2020 07:55   DG Tibia/Fibula Left  Result Date: 12/04/2020 CLINICAL DATA:  Fall EXAM: LEFT TIBIA AND FIBULA - 2 VIEW COMPARISON:  None. FINDINGS: There is no evidence of fracture or other focal bone lesions. Soft tissues are unremarkable. IMPRESSION: Negative. Electronically Signed   By: Franchot Gallo M.D.   On: 12/04/2020 07:55   DG Knee Complete 4 Views Right  Result Date: 12/04/2020 CLINICAL DATA:  Fall EXAM: RIGHT KNEE - COMPLETE 4+ VIEW COMPARISON:  None. FINDINGS: No evidence of fracture, dislocation, or joint effusion. No evidence of arthropathy or other focal bone abnormality. Soft tissues are unremarkable. IMPRESSION: Negative. Electronically  Signed   By: Franchot Gallo M.D.   On: 12/04/2020 07:57   DG Foot Complete Right  Result Date: 12/04/2020 CLINICAL DATA:  Fall EXAM: RIGHT FOOT COMPLETE - 3+ VIEW COMPARISON:  None. FINDINGS: Negative for foot fracture Hallux valgus deformity.  Otherwise no arthropathy. IMPRESSION: Negative for fracture. Electronically Signed   By: Franchot Gallo M.D.   On: 12/04/2020 07:57   DG Femur Min 2 Views Left  Result Date: 12/04/2020 CLINICAL DATA:  Fall EXAM: LEFT FEMUR 2 VIEWS COMPARISON:  None. FINDINGS: Subcapital fracture left femur with mild impaction. Left hip joint normal. No other femur fracture. IMPRESSION: Subcapital fracture left femur Electronically Signed   By: Franchot Gallo M.D.   On: 12/04/2020 07:54    Procedures Procedures (including critical care time)  Medications Ordered in UC Medications - No data to display  Initial Impression / Assessment and Plan / UC Course  I have reviewed the triage vital signs and the nursing notes.  Pertinent labs & imaging results that were available during my care of the patient were reviewed by me and considered in my medical decision making (see chart for details).     Non-traumatic left upper leg pain, patient still ambulatory and has good strength and ROM of left leg. Low suspicion for fracture given these findings so will defer imaging at this time but if not resolving or worsening should follow up for imaging. Not fully consistent with muscular etiology as not reproducible on ROM with resistance. She is agreeable to continuing home tramadol, heat, rest, and other home medications and following up with PCP in the next few days for recheck. She knows to go to ED if sxs worsening acutely for further evaluation and imaging.   Final Clinical Impressions(s) / UC Diagnoses   Final diagnoses:  Left leg pain   Discharge Instructions   None    ED Prescriptions    None     PDMP not reviewed this encounter.   Volney American,  [place] 12/05/20 1131

## 2020-12-04 ENCOUNTER — Emergency Department (HOSPITAL_COMMUNITY): Payer: Medicare Other

## 2020-12-04 ENCOUNTER — Inpatient Hospital Stay (HOSPITAL_COMMUNITY)
Admission: EM | Admit: 2020-12-04 | Discharge: 2020-12-12 | DRG: 521 | Disposition: A | Discharge status: Skilled Nursing Facility | Payer: Medicare Other | Attending: Internal Medicine | Admitting: Internal Medicine

## 2020-12-04 DIAGNOSIS — E7800 Pure hypercholesterolemia, unspecified: Secondary | ICD-10-CM | POA: Diagnosis not present

## 2020-12-04 DIAGNOSIS — I4891 Unspecified atrial fibrillation: Secondary | ICD-10-CM | POA: Diagnosis not present

## 2020-12-04 DIAGNOSIS — Z8542 Personal history of malignant neoplasm of other parts of uterus: Secondary | ICD-10-CM | POA: Diagnosis not present

## 2020-12-04 DIAGNOSIS — I110 Hypertensive heart disease with heart failure: Secondary | ICD-10-CM | POA: Diagnosis present

## 2020-12-04 DIAGNOSIS — D62 Acute posthemorrhagic anemia: Secondary | ICD-10-CM | POA: Diagnosis not present

## 2020-12-04 DIAGNOSIS — Z794 Long term (current) use of insulin: Secondary | ICD-10-CM

## 2020-12-04 DIAGNOSIS — Z888 Allergy status to other drugs, medicaments and biological substances status: Secondary | ICD-10-CM

## 2020-12-04 DIAGNOSIS — Z79899 Other long term (current) drug therapy: Secondary | ICD-10-CM

## 2020-12-04 DIAGNOSIS — E876 Hypokalemia: Secondary | ICD-10-CM | POA: Diagnosis not present

## 2020-12-04 DIAGNOSIS — Z7984 Long term (current) use of oral hypoglycemic drugs: Secondary | ICD-10-CM

## 2020-12-04 DIAGNOSIS — F32A Depression, unspecified: Secondary | ICD-10-CM | POA: Diagnosis present

## 2020-12-04 DIAGNOSIS — Z7902 Long term (current) use of antithrombotics/antiplatelets: Secondary | ICD-10-CM

## 2020-12-04 DIAGNOSIS — Z885 Allergy status to narcotic agent status: Secondary | ICD-10-CM

## 2020-12-04 DIAGNOSIS — I959 Hypotension, unspecified: Secondary | ICD-10-CM | POA: Diagnosis not present

## 2020-12-04 DIAGNOSIS — Z882 Allergy status to sulfonamides status: Secondary | ICD-10-CM

## 2020-12-04 DIAGNOSIS — I272 Pulmonary hypertension, unspecified: Secondary | ICD-10-CM | POA: Diagnosis present

## 2020-12-04 DIAGNOSIS — I2510 Atherosclerotic heart disease of native coronary artery without angina pectoris: Secondary | ICD-10-CM | POA: Diagnosis not present

## 2020-12-04 DIAGNOSIS — K589 Irritable bowel syndrome without diarrhea: Secondary | ICD-10-CM | POA: Diagnosis present

## 2020-12-04 DIAGNOSIS — K219 Gastro-esophageal reflux disease without esophagitis: Secondary | ICD-10-CM | POA: Diagnosis present

## 2020-12-04 DIAGNOSIS — W19XXXA Unspecified fall, initial encounter: Principal | ICD-10-CM

## 2020-12-04 DIAGNOSIS — R5082 Postprocedural fever: Secondary | ICD-10-CM | POA: Diagnosis not present

## 2020-12-04 DIAGNOSIS — M79652 Pain in left thigh: Secondary | ICD-10-CM

## 2020-12-04 DIAGNOSIS — Z800 Family history of malignant neoplasm of digestive organs: Secondary | ICD-10-CM | POA: Diagnosis not present

## 2020-12-04 DIAGNOSIS — G4733 Obstructive sleep apnea (adult) (pediatric): Secondary | ICD-10-CM | POA: Diagnosis present

## 2020-12-04 DIAGNOSIS — F419 Anxiety disorder, unspecified: Secondary | ICD-10-CM | POA: Diagnosis present

## 2020-12-04 DIAGNOSIS — Z8371 Family history of colonic polyps: Secondary | ICD-10-CM | POA: Diagnosis not present

## 2020-12-04 DIAGNOSIS — I5031 Acute diastolic (congestive) heart failure: Secondary | ICD-10-CM | POA: Diagnosis not present

## 2020-12-04 DIAGNOSIS — E861 Hypovolemia: Secondary | ICD-10-CM | POA: Diagnosis not present

## 2020-12-04 DIAGNOSIS — Z6830 Body mass index (BMI) 30.0-30.9, adult: Secondary | ICD-10-CM

## 2020-12-04 DIAGNOSIS — D72829 Elevated white blood cell count, unspecified: Secondary | ICD-10-CM | POA: Diagnosis not present

## 2020-12-04 DIAGNOSIS — R4182 Altered mental status, unspecified: Secondary | ICD-10-CM

## 2020-12-04 DIAGNOSIS — Z981 Arthrodesis status: Secondary | ICD-10-CM

## 2020-12-04 DIAGNOSIS — K760 Fatty (change of) liver, not elsewhere classified: Secondary | ICD-10-CM | POA: Diagnosis present

## 2020-12-04 DIAGNOSIS — S72012A Unspecified intracapsular fracture of left femur, initial encounter for closed fracture: Principal | ICD-10-CM | POA: Diagnosis present

## 2020-12-04 DIAGNOSIS — R2231 Localized swelling, mass and lump, right upper limb: Secondary | ICD-10-CM | POA: Diagnosis not present

## 2020-12-04 DIAGNOSIS — Z8249 Family history of ischemic heart disease and other diseases of the circulatory system: Secondary | ICD-10-CM

## 2020-12-04 DIAGNOSIS — W010XXA Fall on same level from slipping, tripping and stumbling without subsequent striking against object, initial encounter: Secondary | ICD-10-CM | POA: Diagnosis present

## 2020-12-04 DIAGNOSIS — Z20822 Contact with and (suspected) exposure to covid-19: Secondary | ICD-10-CM | POA: Diagnosis present

## 2020-12-04 DIAGNOSIS — N179 Acute kidney failure, unspecified: Secondary | ICD-10-CM | POA: Diagnosis present

## 2020-12-04 DIAGNOSIS — S72002A Fracture of unspecified part of neck of left femur, initial encounter for closed fracture: Secondary | ICD-10-CM

## 2020-12-04 DIAGNOSIS — I480 Paroxysmal atrial fibrillation: Secondary | ICD-10-CM | POA: Diagnosis not present

## 2020-12-04 DIAGNOSIS — Z8041 Family history of malignant neoplasm of ovary: Secondary | ICD-10-CM

## 2020-12-04 DIAGNOSIS — Z803 Family history of malignant neoplasm of breast: Secondary | ICD-10-CM | POA: Diagnosis not present

## 2020-12-04 DIAGNOSIS — Y92009 Unspecified place in unspecified non-institutional (private) residence as the place of occurrence of the external cause: Secondary | ICD-10-CM | POA: Diagnosis not present

## 2020-12-04 DIAGNOSIS — E1169 Type 2 diabetes mellitus with other specified complication: Secondary | ICD-10-CM

## 2020-12-04 DIAGNOSIS — E785 Hyperlipidemia, unspecified: Secondary | ICD-10-CM | POA: Diagnosis present

## 2020-12-04 DIAGNOSIS — E119 Type 2 diabetes mellitus without complications: Secondary | ICD-10-CM

## 2020-12-04 DIAGNOSIS — Z886 Allergy status to analgesic agent status: Secondary | ICD-10-CM

## 2020-12-04 DIAGNOSIS — Z9049 Acquired absence of other specified parts of digestive tract: Secondary | ICD-10-CM | POA: Diagnosis not present

## 2020-12-04 DIAGNOSIS — Z419 Encounter for procedure for purposes other than remedying health state, unspecified: Secondary | ICD-10-CM

## 2020-12-04 DIAGNOSIS — R0602 Shortness of breath: Secondary | ICD-10-CM

## 2020-12-04 DIAGNOSIS — G9341 Metabolic encephalopathy: Secondary | ICD-10-CM | POA: Diagnosis not present

## 2020-12-04 DIAGNOSIS — Z90710 Acquired absence of both cervix and uterus: Secondary | ICD-10-CM | POA: Diagnosis not present

## 2020-12-04 DIAGNOSIS — I9589 Other hypotension: Secondary | ICD-10-CM | POA: Diagnosis not present

## 2020-12-04 DIAGNOSIS — I252 Old myocardial infarction: Secondary | ICD-10-CM

## 2020-12-04 DIAGNOSIS — R339 Retention of urine, unspecified: Secondary | ICD-10-CM | POA: Diagnosis not present

## 2020-12-04 DIAGNOSIS — I700 Atherosclerosis of aorta: Secondary | ICD-10-CM | POA: Diagnosis present

## 2020-12-04 DIAGNOSIS — E669 Obesity, unspecified: Secondary | ICD-10-CM | POA: Diagnosis present

## 2020-12-04 DIAGNOSIS — E1142 Type 2 diabetes mellitus with diabetic polyneuropathy: Secondary | ICD-10-CM | POA: Diagnosis present

## 2020-12-04 DIAGNOSIS — Z833 Family history of diabetes mellitus: Secondary | ICD-10-CM | POA: Diagnosis not present

## 2020-12-04 DIAGNOSIS — S72012P Unspecified intracapsular fracture of left femur, subsequent encounter for closed fracture with malunion: Secondary | ICD-10-CM | POA: Diagnosis not present

## 2020-12-04 DIAGNOSIS — R748 Abnormal levels of other serum enzymes: Secondary | ICD-10-CM

## 2020-12-04 DIAGNOSIS — M797 Fibromyalgia: Secondary | ICD-10-CM | POA: Diagnosis present

## 2020-12-04 DIAGNOSIS — E1165 Type 2 diabetes mellitus with hyperglycemia: Secondary | ICD-10-CM | POA: Diagnosis not present

## 2020-12-04 LAB — CBC WITH DIFFERENTIAL/PLATELET
Abs Immature Granulocytes: 0.06 10*3/uL (ref 0.00–0.07)
Basophils Absolute: 0.1 10*3/uL (ref 0.0–0.1)
Basophils Relative: 1 %
Eosinophils Absolute: 0.2 10*3/uL (ref 0.0–0.5)
Eosinophils Relative: 2 %
HCT: 40.4 % (ref 36.0–46.0)
Hemoglobin: 12.5 g/dL (ref 12.0–15.0)
Immature Granulocytes: 1 %
Lymphocytes Relative: 15 %
Lymphs Abs: 1.6 10*3/uL (ref 0.7–4.0)
MCH: 26.4 pg (ref 26.0–34.0)
MCHC: 30.9 g/dL (ref 30.0–36.0)
MCV: 85.2 fL (ref 80.0–100.0)
Monocytes Absolute: 0.8 10*3/uL (ref 0.1–1.0)
Monocytes Relative: 8 %
Neutro Abs: 7.9 10*3/uL — ABNORMAL HIGH (ref 1.7–7.7)
Neutrophils Relative %: 73 %
Platelets: 302 10*3/uL (ref 150–400)
RBC: 4.74 MIL/uL (ref 3.87–5.11)
RDW: 15 % (ref 11.5–15.5)
WBC: 10.7 10*3/uL — ABNORMAL HIGH (ref 4.0–10.5)
nRBC: 0 % (ref 0.0–0.2)

## 2020-12-04 LAB — COMPREHENSIVE METABOLIC PANEL
ALT: 19 U/L (ref 0–44)
AST: 32 U/L (ref 15–41)
Albumin: 3.8 g/dL (ref 3.5–5.0)
Alkaline Phosphatase: 130 U/L — ABNORMAL HIGH (ref 38–126)
Anion gap: 9 (ref 5–15)
BUN: 14 mg/dL (ref 8–23)
CO2: 27 mmol/L (ref 22–32)
Calcium: 9 mg/dL (ref 8.9–10.3)
Chloride: 101 mmol/L (ref 98–111)
Creatinine, Ser: 0.73 mg/dL (ref 0.44–1.00)
GFR, Estimated: >60 mL/min (ref >60)
Glucose, Bld: 243 mg/dL — ABNORMAL HIGH (ref 70–99)
Potassium: 4.4 mmol/L (ref 3.5–5.1)
Sodium: 137 mmol/L (ref 135–145)
Total Bilirubin: 0.8 mg/dL (ref 0.3–1.2)
Total Protein: 6.9 g/dL (ref 6.5–8.1)

## 2020-12-04 LAB — RESP PANEL BY RT-PCR (FLU A&B, COVID) ARPGX2
Influenza A by PCR: NEGATIVE
Influenza B by PCR: NEGATIVE
SARS Coronavirus 2 by RT PCR: NEGATIVE

## 2020-12-04 LAB — HEMOGLOBIN A1C
Hgb A1c MFr Bld: 10.3 % — ABNORMAL HIGH (ref 4.8–5.6)
Mean Plasma Glucose: 248.91 mg/dL

## 2020-12-04 LAB — GLUCOSE, CAPILLARY
Glucose-Capillary: 252 mg/dL — ABNORMAL HIGH (ref 70–99)
Glucose-Capillary: 249 mg/dL — ABNORMAL HIGH (ref 70–99)
Glucose-Capillary: 233 mg/dL — ABNORMAL HIGH (ref 70–99)

## 2020-12-04 MED ORDER — PREGABALIN 50 MG PO CAPS
50.0000 mg | ORAL_CAPSULE | Freq: Two times a day (BID) | ORAL | Status: DC
  Administered 2020-12-04 – 2020-12-12 (×16): 50 mg via ORAL
  Filled 2020-12-04: qty 1
  Filled 2020-12-04 (×2): qty 2
  Filled 2020-12-04 (×3): qty 1
  Filled 2020-12-04: qty 2
  Filled 2020-12-04: qty 1
  Filled 2020-12-04: qty 2
  Filled 2020-12-04 (×4): qty 1
  Filled 2020-12-04: qty 2
  Filled 2020-12-04 (×2): qty 1

## 2020-12-04 MED ORDER — METHOCARBAMOL 1000 MG/10ML IJ SOLN
500.0000 mg | Freq: Four times a day (QID) | INTRAVENOUS | Status: DC | PRN
  Filled 2020-12-04: qty 5

## 2020-12-04 MED ORDER — PANTOPRAZOLE SODIUM 40 MG PO TBEC
40.0000 mg | DELAYED_RELEASE_TABLET | Freq: Two times a day (BID) | ORAL | Status: DC
  Administered 2020-12-04 – 2020-12-12 (×16): 40 mg via ORAL
  Filled 2020-12-04 (×16): qty 1

## 2020-12-04 MED ORDER — MORPHINE SULFATE (PF) 4 MG/ML IV SOLN
4.0000 mg | Freq: Once | INTRAVENOUS | Status: AC
  Administered 2020-12-04: 4 mg via INTRAVENOUS
  Filled 2020-12-04: qty 1

## 2020-12-04 MED ORDER — TAMSULOSIN HCL 0.4 MG PO CAPS
0.4000 mg | ORAL_CAPSULE | Freq: Every day | ORAL | Status: DC
  Administered 2020-12-04 – 2020-12-11 (×8): 0.4 mg via ORAL
  Filled 2020-12-04 (×8): qty 1

## 2020-12-04 MED ORDER — ENOXAPARIN SODIUM 40 MG/0.4ML ~~LOC~~ SOLN
40.0000 mg | SUBCUTANEOUS | Status: DC
  Administered 2020-12-04: 40 mg via SUBCUTANEOUS
  Filled 2020-12-04: qty 0.4

## 2020-12-04 MED ORDER — METHOCARBAMOL 500 MG PO TABS
500.0000 mg | ORAL_TABLET | Freq: Four times a day (QID) | ORAL | Status: DC | PRN
  Administered 2020-12-04 (×2): 500 mg via ORAL
  Filled 2020-12-04 (×2): qty 1

## 2020-12-04 MED ORDER — DIPHENHYDRAMINE HCL 50 MG/ML IJ SOLN
25.0000 mg | Freq: Four times a day (QID) | INTRAMUSCULAR | Status: DC | PRN
  Filled 2020-12-04: qty 1

## 2020-12-04 MED ORDER — AMLODIPINE BESYLATE 5 MG PO TABS
5.0000 mg | ORAL_TABLET | Freq: Every day | ORAL | Status: DC
  Administered 2020-12-04: 5 mg via ORAL
  Filled 2020-12-04 (×2): qty 1

## 2020-12-04 MED ORDER — METOPROLOL SUCCINATE ER 50 MG PO TB24
100.0000 mg | ORAL_TABLET | Freq: Two times a day (BID) | ORAL | Status: DC
  Administered 2020-12-04 – 2020-12-05 (×4): 100 mg via ORAL
  Filled 2020-12-04 (×5): qty 2

## 2020-12-04 MED ORDER — ATORVASTATIN CALCIUM 10 MG PO TABS
20.0000 mg | ORAL_TABLET | Freq: Every day | ORAL | Status: DC
  Administered 2020-12-04 – 2020-12-11 (×8): 20 mg via ORAL
  Filled 2020-12-04 (×8): qty 2

## 2020-12-04 MED ORDER — SUMATRIPTAN SUCCINATE 100 MG PO TABS
100.0000 mg | ORAL_TABLET | ORAL | Status: DC | PRN
  Filled 2020-12-04: qty 1

## 2020-12-04 MED ORDER — ALPRAZOLAM 0.5 MG PO TABS
0.5000 mg | ORAL_TABLET | Freq: Every evening | ORAL | Status: DC | PRN
  Administered 2020-12-05 – 2020-12-11 (×5): 1 mg via ORAL
  Filled 2020-12-04 (×6): qty 2

## 2020-12-04 MED ORDER — ZOLPIDEM TARTRATE 5 MG PO TABS: 5.0000 mg | ORAL_TABLET | Freq: Every evening | ORAL | Status: DC | PRN

## 2020-12-04 MED ORDER — IRBESARTAN 300 MG PO TABS
300.0000 mg | ORAL_TABLET | Freq: Every day | ORAL | Status: DC
  Administered 2020-12-04: 300 mg via ORAL
  Filled 2020-12-04 (×2): qty 1

## 2020-12-04 MED ORDER — SENNOSIDES-DOCUSATE SODIUM 8.6-50 MG PO TABS
1.0000 | ORAL_TABLET | Freq: Every day | ORAL | Status: DC
  Administered 2020-12-04: 1 via ORAL
  Filled 2020-12-04: qty 1

## 2020-12-04 MED ORDER — MORPHINE SULFATE (PF) 2 MG/ML IV SOLN: 0.5000 mg | INTRAVENOUS | Status: DC | PRN

## 2020-12-04 MED ORDER — INSULIN ASPART 100 UNIT/ML ~~LOC~~ SOLN
0.0000 [IU] | Freq: Three times a day (TID) | SUBCUTANEOUS | Status: DC
  Administered 2020-12-04: 5 [IU] via SUBCUTANEOUS
  Administered 2020-12-04 – 2020-12-05 (×2): 8 [IU] via SUBCUTANEOUS
  Administered 2020-12-06 (×3): 15 [IU] via SUBCUTANEOUS
  Administered 2020-12-07: 11 [IU] via SUBCUTANEOUS
  Administered 2020-12-07: 8 [IU] via SUBCUTANEOUS
  Administered 2020-12-07: 11 [IU] via SUBCUTANEOUS
  Administered 2020-12-08: 5 [IU] via SUBCUTANEOUS
  Administered 2020-12-08: 11 [IU] via SUBCUTANEOUS
  Administered 2020-12-08: 8 [IU] via SUBCUTANEOUS
  Administered 2020-12-09: 11 [IU] via SUBCUTANEOUS
  Administered 2020-12-09 – 2020-12-10 (×3): 5 [IU] via SUBCUTANEOUS
  Administered 2020-12-10 – 2020-12-11 (×3): 3 [IU] via SUBCUTANEOUS
  Administered 2020-12-11: 5 [IU] via SUBCUTANEOUS
  Administered 2020-12-11 – 2020-12-12 (×2): 3 [IU] via SUBCUTANEOUS
  Administered 2020-12-12: 8 [IU] via SUBCUTANEOUS

## 2020-12-04 MED ORDER — HYDROCODONE-ACETAMINOPHEN 5-325 MG PO TABS
1.0000 | ORAL_TABLET | Freq: Four times a day (QID) | ORAL | Status: DC | PRN
  Administered 2020-12-04: 1 via ORAL
  Administered 2020-12-04 – 2020-12-05 (×3): 2 via ORAL
  Administered 2020-12-05: 1 via ORAL
  Filled 2020-12-04: qty 2
  Filled 2020-12-04: qty 1
  Filled 2020-12-04: qty 2
  Filled 2020-12-04 (×2): qty 1
  Filled 2020-12-04: qty 2

## 2020-12-04 MED ORDER — LABETALOL HCL 5 MG/ML IV SOLN: 5.0000 mg | INTRAVENOUS | Status: DC | PRN

## 2020-12-04 MED ORDER — VENLAFAXINE HCL ER 75 MG PO CP24
150.0000 mg | ORAL_CAPSULE | Freq: Two times a day (BID) | ORAL | Status: DC
  Administered 2020-12-04 – 2020-12-12 (×16): 150 mg via ORAL
  Filled 2020-12-04 (×2): qty 2
  Filled 2020-12-04 (×2): qty 1
  Filled 2020-12-04 (×3): qty 2
  Filled 2020-12-04: qty 1
  Filled 2020-12-04 (×3): qty 2
  Filled 2020-12-04: qty 1
  Filled 2020-12-04 (×3): qty 2
  Filled 2020-12-04: qty 1

## 2020-12-04 NOTE — H&P (View-Only) (Signed)
Reason for Consult:Left hip fx Referring Physician: Robyn Haber Time called: [id number] Time at bedside: Alexis [name] is an 70 y.o. female.  HPI: Alexis [name] got up early this morning ~0300 to pee and tripped over a heating blanket cord. She fell onto to left side and had immediate pain. Her husband managed to get her up to the side of the bed with great difficulty but she could not put any weight down on the left leg. She was brought to the ED where x-rays showed a left hip fx and orthopedic surgery was consulted. She lives at home with her husband and is retired.  Past Medical History:  Diagnosis Date  . Anxiety   . Arthritis    "knees" (05/25/2014)  . Bursitis of knee    "both"  . Chronic back pain   . Depression   . Diabetes mellitus type II   . Diabetic peripheral neuropathy ([place]) 10/20/2018  . Diverticulitis   . Fatty liver   . Fibromyalgia   . Gastric polyp    hyperplastic  . Gastroparesis    "recently dx'd" (05/25/2014)  . GERD (gastroesophageal reflux disease)   . H/O hiatal hernia   . [id number])    "monthly" (05/25/2014)  . Heart attack ([place]) 2014   "mild"  . Hx of gastritis   . Hyperlipidemia   . Hypertension   . Irritable bowel syndrome (IBS)   . Memory difficulties 09/18/2017  . Migraine without aura, without mention of intractable migraine without mention of status migrainosus    "related to allergies; have them in the spring and fall" (05/25/2014)  . Obesity   . Pancreatic cyst    peripancreatic cystic lesion  . Renal cyst   . Sleep apnea    "wore mask; took it off in my sleep; quit wearing it" (05/25/2014)  . Sprain of neck 03/18/2013  . Uterine cancer ([place]) dx'd 2000   surg only  . Vasculitis ([place])    "irritates my legs"  . Wears glasses     Past Surgical History:  Procedure Laterality Date  . ANTERIOR CERVICAL DECOMPRESSION/DISCECTOMY FUSION 4 LEVELS N/A 05/07/2018   Procedure: ANTERIOR CERVICAL DECOMPRESSION FUSION, CERVICAL 4-5,CERVICAL  5-6, CERVICAL 6-7 WITH INSTRUMENTATION AND ALLOGRAFT;  Surgeon: Phylliss Bob, MD;  Location: [facility];  Service: Orthopedics;  Laterality: N/A;  . APPENDECTOMY  ~ 1967  . BREAST CYST EXCISION Right 1990  . COLONOSCOPY    . LAPAROSCOPIC CHOLECYSTECTOMY  1990  . LEFT HEART CATHETERIZATION WITH CORONARY ANGIOGRAM N/A 05/26/2014   Procedure: LEFT HEART CATHETERIZATION WITH CORONARY ANGIOGRAM;  Surgeon: Troy Sine, MD;  Location: [facility] CATH LAB;  Service: Cardiovascular;  Laterality: N/A;  . SINUS SURGERY WITH INSTATRAK  2000  . TUBAL LIGATION  ~ 1982  . VAGINAL HYSTERECTOMY  2000    Family History  Problem Relation Age of Onset  . Breast cancer Sister   . Colon polyps Father   . Heart disease Father   . Colon cancer Maternal Uncle   . Ovarian cancer Maternal Aunt   . Stomach cancer Maternal Aunt   . Diabetes Maternal Aunt   . Heart disease Maternal Uncle   . Heart disease Other        Grandparents  . Irritable bowel syndrome Daughter     Social History:  reports that she has never smoked. She has never used smokeless tobacco. She reports that she does not drink alcohol and does not use drugs.  Allergies:  Allergies  Allergen  Reactions  . Aspirin Other (See Comments)    Reaction: Vasculitis per MD  . Codeine Itching and Rash  . Naproxen Nausea Only    Reaction: Makes stomach upset/irritated.  . Sulfonamide Derivatives Itching and Rash    Medications: I have reviewed the patient's current medications.  Results for orders placed or performed during the hospital encounter of 12/04/20 (from the past 48 hour(s))  Comprehensive metabolic panel     Status: Abnormal   Collection Time: 12/04/20  5:49 AM  Result Value Ref Range   Sodium 137 135 - 145 mmol/L   Potassium 4.4 3.5 - 5.1 mmol/L   Chloride 101 98 - 111 mmol/L   CO2 27 22 - 32 mmol/L   Glucose, Bld 243 (H) 70 - 99 mg/dL    Comment: Glucose reference range applies only to samples taken after fasting for at least 8 hours.    BUN 14 8 - 23 mg/dL   Creatinine, Ser 0.73 0.44 - 1.00 mg/dL   Calcium 9.0 8.9 - 10.3 mg/dL   Total Protein 6.9 6.5 - 8.1 g/dL   Albumin 3.8 3.5 - 5.0 g/dL   AST 32 15 - 41 U/L   ALT 19 0 - 44 U/L   Alkaline Phosphatase 130 (H) 38 - 126 U/L   Total Bilirubin 0.8 0.3 - 1.2 mg/dL   GFR, Estimated >60 >60 mL/min    Comment: (NOTE) Calculated using the CKD-EPI Creatinine Equation (2021)    Anion gap 9 5 - 15    Comment: Performed at [facility] [address], [place], [place] [address]  CBC with Differential     Status: Abnormal   Collection Time: 12/04/20  5:49 AM  Result Value Ref Range   WBC 10.7 (H) 4.0 - 10.5 K/uL   RBC 4.74 3.87 - 5.11 MIL/uL   Hemoglobin 12.5 12.0 - 15.0 g/dL   HCT 40.4 36.0 - 46.0 %   MCV 85.2 80.0 - 100.0 fL   MCH 26.4 26.0 - 34.0 pg   MCHC 30.9 30.0 - 36.0 g/dL   RDW 15.0 11.5 - 15.5 %   Platelets 302 150 - 400 K/uL   nRBC 0.0 0.0 - 0.2 %   Neutrophils Relative % 73 %   Neutro Abs 7.9 (H) 1.7 - 7.7 K/uL   Lymphocytes Relative 15 %   Lymphs Abs 1.6 0.7 - 4.0 K/uL   Monocytes Relative 8 %   Monocytes Absolute 0.8 0.1 - 1.0 K/uL   Eosinophils Relative 2 %   Eosinophils Absolute 0.2 0.0 - 0.5 K/uL   Basophils Relative 1 %   Basophils Absolute 0.1 0.0 - 0.1 K/uL   Immature Granulocytes 1 %   Abs Immature Granulocytes 0.06 0.00 - 0.07 K/uL    Comment: Performed at [facility] [address] [place], [place] [address]  Type and screen [place]     Status: None   Collection Time: 12/04/20  5:49 AM  Result Value Ref Range   ABO/RH(D) O POS    Antibody Screen NEG    Sample Expiration      12/07/2020,2359 Performed at [facility], [place] [address], [place], [place] [address]     DG Chest 1 View  Result Date: 12/04/2020 CLINICAL DATA:  Fall EXAM: CHEST  1 VIEW COMPARISON:  05/25/2014 FINDINGS: Prominent heart size without heart failure. Lungs well aerated and clear. No infiltrate or effusion.  Atherosclerotic aorta. ACDF cervical spine IMPRESSION: No active  disease. Electronically Signed   By: Franchot Gallo M.D.   On: 12/04/2020 07:58   DG Lumbar Spine Complete  Result Date: 12/04/2020 CLINICAL DATA:  Fall EXAM: LUMBAR SPINE - COMPLETE 4+ VIEW COMPARISON:  None. FINDINGS: Negative for lumbar fracture Normal alignment. Mild scoliosis. Mild to moderate disc degeneration at L3-4. Atherosclerotic aorta. IMPRESSION: Negative for fracture. Electronically Signed   By: Franchot Gallo M.D.   On: 12/04/2020 07:56   DG Pelvis 1-2 Views  Result Date: 12/04/2020 CLINICAL DATA:  Fall EXAM: PELVIS - 1-2 VIEW COMPARISON:  None. FINDINGS: Subcapital fracture left femur with impaction. No significant displacement. Left hip joint normal. No other pelvic fracture identified. IMPRESSION: Subcapital fracture left femur Electronically Signed   By: Franchot Gallo M.D.   On: 12/04/2020 07:55   DG Tibia/Fibula Left  Result Date: 12/04/2020 CLINICAL DATA:  Fall EXAM: LEFT TIBIA AND FIBULA - 2 VIEW COMPARISON:  None. FINDINGS: There is no evidence of fracture or other focal bone lesions. Soft tissues are unremarkable. IMPRESSION: Negative. Electronically Signed   By: Franchot Gallo M.D.   On: 12/04/2020 07:55   DG Knee Complete 4 Views Right  Result Date: 12/04/2020 CLINICAL DATA:  Fall EXAM: RIGHT KNEE - COMPLETE 4+ VIEW COMPARISON:  None. FINDINGS: No evidence of fracture, dislocation, or joint effusion. No evidence of arthropathy or other focal bone abnormality. Soft tissues are unremarkable. IMPRESSION: Negative. Electronically Signed   By: Franchot Gallo M.D.   On: 12/04/2020 07:57   DG Foot Complete Right  Result Date: 12/04/2020 CLINICAL DATA:  Fall EXAM: RIGHT FOOT COMPLETE - 3+ VIEW COMPARISON:  None. FINDINGS: Negative for foot fracture Hallux valgus deformity.  Otherwise no arthropathy. IMPRESSION: Negative for fracture. Electronically Signed   By: Franchot Gallo M.D.   On: 12/04/2020 07:57   DG  Femur Min 2 Views Left  Result Date: 12/04/2020 CLINICAL DATA:  Fall EXAM: LEFT FEMUR 2 VIEWS COMPARISON:  None. FINDINGS: Subcapital fracture left femur with mild impaction. Left hip joint normal. No other femur fracture. IMPRESSION: Subcapital fracture left femur Electronically Signed   By: Franchot Gallo M.D.   On: 12/04/2020 07:54    Review of Systems  HENT: Negative for ear discharge, ear pain, hearing loss and tinnitus.   Eyes: Negative for photophobia and pain.  Respiratory: Negative for cough and shortness of breath.   Cardiovascular: Negative for chest pain.  Gastrointestinal: Negative for abdominal pain, nausea and vomiting.  Genitourinary: Negative for dysuria, flank pain, frequency and urgency.  Musculoskeletal: Positive for arthralgias (LLE). Negative for back pain, myalgias and neck pain.  Neurological: Negative for dizziness and headaches.  Hematological: Does not bruise/bleed easily.  Psychiatric/Behavioral: The patient is not nervous/anxious.    Blood pressure (!) 149/63, pulse 80, temperature 98.3 F (36.8 C), resp. rate 12, SpO2 92 %. Physical Exam Constitutional:      General: She is not in acute distress.    Appearance: She is well-developed. She is not diaphoretic.  HENT:     Head: Normocephalic and atraumatic.  Eyes:     General: No scleral icterus.       Right eye: No discharge.        Left eye: No discharge.     Conjunctiva/sclera: Conjunctivae normal.  Cardiovascular:     Rate and Rhythm: Normal rate and regular rhythm.  Pulmonary:     Effort: Pulmonary effort is normal. No respiratory distress.  Musculoskeletal:     Cervical back: Normal range of motion.  Comments: LLE No traumatic wounds, ecchymosis, or rash  Mod TTP hip  No knee or ankle effusion  Knee stable to varus/ valgus and anterior/posterior stress  Sens DPN, SPN, TN intact  Motor EHL, ext, flex, evers 5/5  DP 2+, PT 0, No significant edema  Skin:    General: Skin is warm and dry.   Neurological:     Mental Status: She is alert.  Psychiatric:        Behavior: Behavior normal.     Assessment/Plan: Left hip fx -- Plan THA tomorrow with Dr. Lyla Glassing. Please keep NPO after MN. Multiple medical problems including hypertension, diabetes, hyperlipidemia, coronary artery disease, and balance issues -- IM to admit and manage. Appreciate their assistance.    Alexis Abu, PA-C Orthopedic Surgery [phone] 12/04/2020, 9:05 AM

## 2020-12-04 NOTE — Plan of Care (Signed)

## 2020-12-04 NOTE — H&P (Signed)
History and Physical    Alexis [name] MPN:[id number] DOB: Jan 23, 1951 DOA: 12/04/2020  Referring MD/NP/PA: Davonna Belling, MD PCP: Deland Pretty, MD  Consultants: Lyman Bishop, MD cardiology Patient coming from: Home(lives with husband) via EMS  Chief Complaint: Fall   I have personally briefly reviewed patient's old medical records in [place]   HPI: Alexis [name] is a 70 y.o. female with medical history significant of hypertension, hyperlipidemia, CAD, diabetes mellitus type 2 with peripheral neuropathy, uterine cancer s/p hysterectomy, and OSA presents after having a fall at home.  She had gotten up to use restroom this morning around 3-4 am and tripped over a heating blanket cord.  She did not hit her head or lose consciousness at the time.  Her husband immediately woke up and tried to help her get up.  She reported having intense pain in the left hip for which she was unable to bear weight.  Associated symptoms of pain in her right foot as well.  Patient denies having any recent fever, chills, chest pain, shortness of breath, cough, nausea, vomiting, diarrhea, or recent sick contacts to her knowledge.  She had just recently had a coronary CT on  4/12 which showed diffuse nonobstructive coronary artery disease.  In route with EMS patient was given normal saline 250 mL and fentanyl 100 mcg IV.  ED Course: Upon admission into the emergency department patient was noted to be afebrile with blood pressure 149/63-162/77, and all other vital signs maintained.  X-rays revealed a subcapital fracture of the left femur.  Labs significant for WBC 10.7, glucose 243, anion gap 9, and alkaline phosphatase 130.  Orion Crook, PA of orthopedics had been consulted.  She had been given  Morphine 4 mg IV x1 dose.  TRH called to admit.  Review of Systems  Constitutional: Negative for fever and malaise/fatigue.  HENT: Negative for ear discharge and nosebleeds.   Eyes: Negative for  photophobia and pain.  Respiratory: Negative for cough and shortness of breath.   Cardiovascular: Negative for chest pain and leg swelling.  Gastrointestinal: Negative for abdominal pain, diarrhea, nausea and vomiting.  Genitourinary: Negative for dysuria and frequency.  Musculoskeletal: Positive for back pain, falls and myalgias.  Skin: Negative for rash.  Neurological: Negative for focal weakness and loss of consciousness.  Endo/Heme/Allergies: Negative for polydipsia.  Psychiatric/Behavioral: Positive for memory loss. Negative for substance abuse.    Past Medical History:  Diagnosis Date  . Anxiety   . Arthritis    "knees" (05/25/2014)  . Bursitis of knee    "both"  . Chronic back pain   . Depression   . Diabetes mellitus type II   . Diabetic peripheral neuropathy ([place]) 10/20/2018  . Diverticulitis   . Fatty liver   . Fibromyalgia   . Gastric polyp    hyperplastic  . Gastroparesis    "recently dx'd" (05/25/2014)  . GERD (gastroesophageal reflux disease)   . H/O hiatal hernia   . MGQQPYPP(509.3)    "monthly" (05/25/2014)  . Heart attack ([place]) 2014   "mild"  . Hx of gastritis   . Hyperlipidemia   . Hypertension   . Irritable bowel syndrome (IBS)   . Memory difficulties 09/18/2017  . Migraine without aura, without mention of intractable migraine without mention of status migrainosus    "related to allergies; have them in the spring and fall" (05/25/2014)  . Obesity   . Pancreatic cyst    peripancreatic cystic lesion  . Renal cyst   .  Sleep apnea    "wore mask; took it off in my sleep; quit wearing it" (05/25/2014)  . Sprain of neck 03/18/2013  . Uterine cancer ([place]) dx'd 2000   surg only  . Vasculitis ([place])    "irritates my legs"  . Wears glasses     Past Surgical History:  Procedure Laterality Date  . ANTERIOR CERVICAL DECOMPRESSION/DISCECTOMY FUSION 4 LEVELS N/A 05/07/2018   Procedure: ANTERIOR CERVICAL DECOMPRESSION FUSION, CERVICAL 4-5,CERVICAL 5-6, CERVICAL  6-7 WITH INSTRUMENTATION AND ALLOGRAFT;  Surgeon: Phylliss Bob, MD;  Location: [facility];  Service: Orthopedics;  Laterality: N/A;  . APPENDECTOMY  ~ 1967  . BREAST CYST EXCISION Right 1990  . COLONOSCOPY    . LAPAROSCOPIC CHOLECYSTECTOMY  1990  . LEFT HEART CATHETERIZATION WITH CORONARY ANGIOGRAM N/A 05/26/2014   Procedure: LEFT HEART CATHETERIZATION WITH CORONARY ANGIOGRAM;  Surgeon: Troy Sine, MD;  Location[facility] CATH LAB;  Service: Cardiovascular;  Laterality: N/A;  . SINUS SURGERY WITH INSTATRAK  2000  . TUBAL LIGATION  ~ 1982  . VAGINAL HYSTERECTOMY  2000     reports that she has never smoked. She has never used smokeless tobacco. She reports that she does not drink alcohol and does not use drugs.  Allergies  Allergen Reactions  . Aspirin Other (See Comments)    Reaction: Vasculitis per MD  . Codeine Itching and Rash  . Naproxen Nausea Only    Reaction: Makes stomach upset/irritated.  . Sulfonamide Derivatives Itching and Rash    Family History  Problem Relation Age of Onset  . Breast cancer Sister   . Colon polyps Father   . Heart disease Father   . Colon cancer Maternal Uncle   . Ovarian cancer Maternal Aunt   . Stomach cancer Maternal Aunt   . Diabetes Maternal Aunt   . Heart disease Maternal Uncle   . Heart disease Other        Grandparents  . Irritable bowel syndrome Daughter     Prior to Admission medications   Medication Sig Start Date End Date Taking? Authorizing Provider  acetaminophen (TYLENOL) 500 MG tablet Take 500 mg by mouth every 6 (six) hours as needed for moderate pain or headache.     [provider]  ALPRAZolam Duanne Moron) 0.5 MG tablet Take 0.5-1 mg by mouth at bedtime as needed for anxiety.     [provider]  amLODipine (NORVASC) 5 MG tablet Take 5 mg by mouth daily. 08/27/19   [provider]  atorvastatin (LIPITOR) 20 MG tablet Take 20 mg by mouth daily.    [provider]  b complex vitamins tablet Take 1  tablet by mouth daily.    [provider]  clopidogrel (PLAVIX) 75 MG tablet TAKE 1 TABLET BY MOUTH EVERY DAY 12/30/19   Kathrynn Ducking, MD  cyanocobalamin 1000 MCG tablet Vitamin B-12  1,000 mcg tablet  Take by oral route.    [provider]  FARXIGA 10 MG TABS tablet Take 10 mg by mouth daily. 09/10/19   [provider]  fish oil-omega-3 fatty acids 1000 MG capsule Take 1 g by mouth at bedtime.     [provider]  furosemide (LASIX) 20 MG tablet TAKE 1 TABLET (20 MG TOTAL) BY MOUTH AS NEEDED FOR EDEMA. 08/20/19   Almyra Deforest, PA  metFORMIN (GLUCOPHAGE-XR) 500 MG 24 hr tablet SMARTSIG:1 Pill By Mouth Daily 08/24/19   [provider]  metoprolol succinate (TOPROL-XL) 50 MG 24 hr tablet Take 100 mg by  mouth 2 (two) times daily. Take with or immediately following a meal.    [provider]  pregabalin (LYRICA) 50 MG capsule Take 50 mg by mouth 2 (two) times daily.    [provider]  RABEprazole (ACIPHEX) 20 MG tablet Take 1 tablet (20 mg total) by mouth 2 (two) times daily. 04/22/12   Lafayette Dragon, MD  rizatriptan (MAXALT) 10 MG tablet Take 10 mg by mouth as needed for migraine. May repeat in 2 hours if needed    [provider]  tamsulosin (FLOMAX) 0.4 MG CAPS capsule Take 0.4 mg by mouth at bedtime.  07/17/16   [provider]  traMADol (ULTRAM) 50 MG tablet Take 100 mg by mouth 2 (two) times daily as needed. 09/13/19   [provider]  TRESIBA FLEXTOUCH 100 UNIT/ML FlexTouch Pen SMARTSIG:25 Unit(s) SUB-Q Daily 09/29/19   [provider]  TRULICITY 1.5 0000000 SOPN SMARTSIG:0.5 Milliliter(s) SUB-Q Once a Week 10/19/19   [provider]  valsartan (DIOVAN) 320 MG tablet Take 320 mg by mouth daily. 09/10/19   [provider]  venlafaxine XR (EFFEXOR-XR) 150 MG 24 hr capsule Take 150 mg by mouth 2 (two) times daily.    [provider]  zaleplon (SONATA) 5 MG capsule Take 5 mg by mouth  at bedtime.    [provider]    Physical Exam:  Constitutional: Elderly female who currently appears to be in no acute distress Vitals:   12/04/20 0529 12/04/20 0545 12/04/20 0600  BP:  (!) 162/77 (!) 149/63  Pulse: 82 85 80  Resp: 16 20 12   Temp: 98.3 F (36.8 C)    SpO2: 93% 90% 92%   Eyes: PERRL, lids and conjunctivae normal ENMT: Mucous membranes are moist. Posterior pharynx clear of any exudate or lesions.  Neck: normal, supple, no masses, no thyromegaly Respiratory: Normal respiratory effort with out significant wheezes or rhonchi appreciated.  Patient currently on 2 L nasal cannula oxygen to maintain O2 saturations Cardiovascular: Regular rate and rhythm, no murmurs / rubs / gallops. No extremity edema. 2+ pedal pulses. No carotid bruits.  Abdomen: no tenderness, no masses palpated. No hepatosplenomegaly. Bowel sounds positive.  Musculoskeletal: no clubbing / cyanosis.  Moderate tenderness to palpation of the left hip. Skin: no rashes, lesions, ulcers. No induration Neurologic: CN 2-12 grossly intact. Sensation intact, DTR normal. Strength 5/5 in all 4.  Psychiatric: Normal judgment and insight. Alert and oriented x 3. Normal mood.     Labs on Admission: I have personally reviewed following labs and imaging studies  CBC: Recent Labs  Lab 12/04/20 0549  WBC 10.7*  NEUTROABS 7.9*  HGB 12.5  HCT 40.4  MCV 85.2  PLT 99991111   Basic Metabolic Panel: Recent Labs  Lab 12/04/20 0549  NA 137  K 4.4  CL 101  CO2 27  GLUCOSE 243*  BUN 14  CREATININE 0.73  CALCIUM 9.0   GFR: CrCl cannot be calculated (Unknown ideal weight.). Liver Function Tests: Recent Labs  Lab 12/04/20 0549  AST 32  ALT 19  ALKPHOS 130*  BILITOT 0.8  PROT 6.9  ALBUMIN 3.8   No results for input(s): LIPASE, AMYLASE in the last 168 hours. No results for input(s): AMMONIA in the last 168 hours. Coagulation Profile: No results for input(s): INR, PROTIME in the last 168  hours. Cardiac Enzymes: No results for input(s): CKTOTAL, CKMB, CKMBINDEX, TROPONINI in the last 168 hours. BNP (last 3 results) No results for input(s): PROBNP in the  last 8760 hours. HbA1C: No results for input(s): HGBA1C in the last 72 hours. CBG: No results for input(s): GLUCAP in the last 168 hours. Lipid Profile: No results for input(s): CHOL, HDL, LDLCALC, TRIG, CHOLHDL, LDLDIRECT in the last 72 hours. Thyroid Function Tests: No results for input(s): TSH, T4TOTAL, FREET4, T3FREE, THYROIDAB in the last 72 hours. Anemia Panel: No results for input(s): VITAMINB12, FOLATE, FERRITIN, TIBC, IRON, RETICCTPCT in the last 72 hours. Urine analysis:    Component Value Date/Time   COLORURINE YELLOW 05/07/2018 0644   APPEARANCEUR CLOUDY (A) 05/07/2018 0644   LABSPEC 1.030 05/07/2018 0644   PHURINE 5.0 05/07/2018 0644   GLUCOSEU >=500 (A) 05/07/2018 0644   HGBUR NEGATIVE 05/07/2018 [place] 05/07/2018 0644   KETONESUR NEGATIVE 05/07/2018 0644   PROTEINUR NEGATIVE 05/07/2018 0644   UROBILINOGEN 0.2 02/09/2014 1603   NITRITE NEGATIVE 05/07/2018 0644   LEUKOCYTESUR MODERATE (A) 05/07/2018 0644   Sepsis Labs: No results found for this or any previous visit (from the past 240 hour(s)).   Radiological Exams on Admission: DG Chest 1 View  Result Date: 12/04/2020 CLINICAL DATA:  Fall EXAM: CHEST  1 VIEW COMPARISON:  05/25/2014 FINDINGS: Prominent heart size without heart failure. Lungs well aerated and clear. No infiltrate or effusion. Atherosclerotic aorta. ACDF cervical spine IMPRESSION: No active disease. Electronically Signed   By: Franchot Gallo M.D.   On: 12/04/2020 07:58   DG Lumbar Spine Complete  Result Date: 12/04/2020 CLINICAL DATA:  Fall EXAM: LUMBAR SPINE - COMPLETE 4+ VIEW COMPARISON:  None. FINDINGS: Negative for lumbar fracture Normal alignment. Mild scoliosis. Mild to moderate disc degeneration at L3-4. Atherosclerotic aorta. IMPRESSION: Negative for  fracture. Electronically Signed   By: Franchot Gallo M.D.   On: 12/04/2020 07:56   DG Pelvis 1-2 Views  Result Date: 12/04/2020 CLINICAL DATA:  Fall EXAM: PELVIS - 1-2 VIEW COMPARISON:  None. FINDINGS: Subcapital fracture left femur with impaction. No significant displacement. Left hip joint normal. No other pelvic fracture identified. IMPRESSION: Subcapital fracture left femur Electronically Signed   By: Franchot Gallo M.D.   On: 12/04/2020 07:55   DG Tibia/Fibula Left  Result Date: 12/04/2020 CLINICAL DATA:  Fall EXAM: LEFT TIBIA AND FIBULA - 2 VIEW COMPARISON:  None. FINDINGS: There is no evidence of fracture or other focal bone lesions. Soft tissues are unremarkable. IMPRESSION: Negative. Electronically Signed   By: Franchot Gallo M.D.   On: 12/04/2020 07:55   DG Knee Complete 4 Views Right  Result Date: 12/04/2020 CLINICAL DATA:  Fall EXAM: RIGHT KNEE - COMPLETE 4+ VIEW COMPARISON:  None. FINDINGS: No evidence of fracture, dislocation, or joint effusion. No evidence of arthropathy or other focal bone abnormality. Soft tissues are unremarkable. IMPRESSION: Negative. Electronically Signed   By: Franchot Gallo M.D.   On: 12/04/2020 07:57   DG Foot Complete Right  Result Date: 12/04/2020 CLINICAL DATA:  Fall EXAM: RIGHT FOOT COMPLETE - 3+ VIEW COMPARISON:  None. FINDINGS: Negative for foot fracture Hallux valgus deformity.  Otherwise no arthropathy. IMPRESSION: Negative for fracture. Electronically Signed   By: Franchot Gallo M.D.   On: 12/04/2020 07:57   DG Femur Min 2 Views Left  Result Date: 12/04/2020 CLINICAL DATA:  Fall EXAM: LEFT FEMUR 2 VIEWS COMPARISON:  None. FINDINGS: Subcapital fracture left femur with mild impaction. Left hip joint normal. No other femur fracture. IMPRESSION: Subcapital fracture left femur Electronically Signed   By: Franchot Gallo M.D.   On: 12/04/2020 07:54    EKG:  Independently reviewed.  Sinus rhythm at 80 bpm with left axis deviation  Assessment/Plan Left  subcapital femur fracture secondary to fall: Acute.  Patient presents after having a mechanical fall after tripping over heating blanket cord with left hip pain.  No reports of loss of consciousness or trauma to her head.  Imaging revealed left subcapital femur fracture.  Orthopedics have been formally consulted with plans on possibly taking to the operating room today. -Admit to MedSurg bed -Hip fracture order set utilized -N.p.o. after midnight -Hydrocodone/morphine IV as needed for moderate to severe pain respectively -Senokot-S nightly for stool regimen.  Adjust as needed -Appreciate orthopedic consultative services, we will follow-up for any further recommendation  Leukocytosis: Acute.  WBC elevated at 10.7.  Suspect secondary to acute fracture. -Check CBC tomorrow a.m.  Diabetes mellitus type 2: On admission glucose elevated at 243.  Last hemoglobin A1c 9.4 in 04/2018.  Home regimen includes Farxiga 10 mg daily, metformin 500 mg daily, Tresiba 25 units daily, and Dulaglutide 3 mg every Sunday. -Hypoglycemic protocol -Add-on hemoglobin A1c -Hold current home regimen -CBGs before every meal with moderate SSI -Reduced  Coronary artery disease: Patient with a prior history of coronary artery disease on Plavix.  Just recently had a coronary CT which showed diffuse nonobstructive coronary artery disease with a CADRADS = 2. -Continue statin and restart Plavix when able  Essential hypertension: On admission patient blood pressures 149/63-162/77.  Blood pressure regimen includes amlodipine 5 mg nightly, metoprolol 100 mg 2 times daily, and Diovan 320 mg daily. -Continue home medication regimen with pharmacy substitution of Diovan -Labetalol IV as needed for elevated blood pressures  Hyperlipidemia medication regimen includes atorvastatin 20 mg nightly. -Continue home medication regimen  Anxiety and depression -Continue venlafaxine and Ativan as needed  OSA: Patient reports not utilizing  CPAP.  GERD -Pharmacy substitution of Protonix twice  DVT prophylaxis: Lovenox Code Status: Full Family Communication: Husband updated at bedside Disposition Plan: Likely to need rehab Consults called: Orthopedics Admission status: Inpatient require more than 2 midnight stay due to need of procedure to correct femur fracture   Norval Morton MD [facility]   If 7PM-7AM, please contact night-coverage   12/04/2020, 8:21 AM   Respiratory rate greater than equal to 22/min Altered mentation Systolic blood pressure less than equal to 100 mmHg

## 2020-12-04 NOTE — Consult Note (Signed)
Reason for Consult:Left hip fx Referring Physician: Robyn Haber Time called: [id number] Time at bedside: [place] is an 70 y.o. female.  HPI: Alexis [name] got up early this morning ~0300 to pee and tripped over a heating blanket cord. She fell onto to left side and had immediate pain. Her husband managed to get her up to the side of the bed with great difficulty but she could not put any weight down on the left leg. She was brought to the ED where x-rays showed a left hip fx and orthopedic surgery was consulted. She lives at home with her husband and is retired.  Past Medical History:  Diagnosis Date  . Anxiety   . Arthritis    "knees" (05/25/2014)  . Bursitis of knee    "both"  . Chronic back pain   . Depression   . Diabetes mellitus type II   . Diabetic peripheral neuropathy ([place]) 10/20/2018  . Diverticulitis   . Fatty liver   . Fibromyalgia   . Gastric polyp    hyperplastic  . Gastroparesis    "recently dx'd" (05/25/2014)  . GERD (gastroesophageal reflux disease)   . H/O hiatal hernia   . [id number]    "monthly" (05/25/2014)  . Heart attack ([place]) 2014   "mild"  . Hx of gastritis   . Hyperlipidemia   . Hypertension   . Irritable bowel syndrome (IBS)   . Memory difficulties 09/18/2017  . Migraine without aura, without mention of intractable migraine without mention of status migrainosus    "related to allergies; have them in the spring and fall" (05/25/2014)  . Obesity   . Pancreatic cyst    peripancreatic cystic lesion  . Renal cyst   . Sleep apnea    "wore mask; took it off in my sleep; quit wearing it" (05/25/2014)  . Sprain of neck 03/18/2013  . Uterine cancer ([place]) dx'd 2000   surg only  . Vasculitis ([place])    "irritates my legs"  . Wears glasses     Past Surgical History:  Procedure Laterality Date  . ANTERIOR CERVICAL DECOMPRESSION/DISCECTOMY FUSION 4 LEVELS N/A 05/07/2018   Procedure: ANTERIOR CERVICAL DECOMPRESSION FUSION, CERVICAL 4-5,CERVICAL  5-6, CERVICAL 6-7 WITH INSTRUMENTATION AND ALLOGRAFT;  Surgeon: Phylliss Bob, MD;  Location: [facility];  Service: Orthopedics;  Laterality: N/A;  . APPENDECTOMY  ~ 1967  . BREAST CYST EXCISION Right 1990  . COLONOSCOPY    . LAPAROSCOPIC CHOLECYSTECTOMY  1990  . LEFT HEART CATHETERIZATION WITH CORONARY ANGIOGRAM N/A 05/26/2014   Procedure: LEFT HEART CATHETERIZATION WITH CORONARY ANGIOGRAM;  Surgeon: Troy Sine, MD;  Location: [facility] CATH LAB;  Service: Cardiovascular;  Laterality: N/A;  . SINUS SURGERY WITH INSTATRAK  2000  . TUBAL LIGATION  ~ 1982  . VAGINAL HYSTERECTOMY  2000    Family History  Problem Relation Age of Onset  . Breast cancer Sister   . Colon polyps Father   . Heart disease Father   . Colon cancer Maternal Uncle   . Ovarian cancer Maternal Aunt   . Stomach cancer Maternal Aunt   . Diabetes Maternal Aunt   . Heart disease Maternal Uncle   . Heart disease Other        Grandparents  . Irritable bowel syndrome Daughter     Social History:  reports that she has never smoked. She has never used smokeless tobacco. She reports that she does not drink alcohol and does not use drugs.  Allergies:  Allergies  Allergen  Reactions  . Aspirin Other (See Comments)    Reaction: Vasculitis per MD  . Codeine Itching and Rash  . Naproxen Nausea Only    Reaction: Makes stomach upset/irritated.  . Sulfonamide Derivatives Itching and Rash    Medications: I have reviewed the patient's current medications.  Results for orders placed or performed during the hospital encounter of 12/04/20 (from the past 48 hour(s))  Comprehensive metabolic panel     Status: Abnormal   Collection Time: 12/04/20  5:49 AM  Result Value Ref Range   Sodium 137 135 - 145 mmol/L   Potassium 4.4 3.5 - 5.1 mmol/L   Chloride 101 98 - 111 mmol/L   CO2 27 22 - 32 mmol/L   Glucose, Bld 243 (H) 70 - 99 mg/dL    Comment: Glucose reference range applies only to samples taken after fasting for at least 8 hours.    BUN 14 8 - 23 mg/dL   Creatinine, Ser 0.73 0.44 - 1.00 mg/dL   Calcium 9.0 8.9 - 10.3 mg/dL   Total Protein 6.9 6.5 - 8.1 g/dL   Albumin 3.8 3.5 - 5.0 g/dL   AST 32 15 - 41 U/L   ALT 19 0 - 44 U/L   Alkaline Phosphatase 130 (H) 38 - 126 U/L   Total Bilirubin 0.8 0.3 - 1.2 mg/dL   GFR, Estimated >60 >60 mL/min    Comment: (NOTE) Calculated using the CKD-EPI Creatinine Equation (2021)    Anion gap 9 5 - 15    Comment: Performed at [facility] [address], [place], [place] [address]  CBC with Differential     Status: Abnormal   Collection Time: 12/04/20  5:49 AM  Result Value Ref Range   WBC 10.7 (H) 4.0 - 10.5 K/uL   RBC 4.74 3.87 - 5.11 MIL/uL   Hemoglobin 12.5 12.0 - 15.0 g/dL   HCT 40.4 36.0 - 46.0 %   MCV 85.2 80.0 - 100.0 fL   MCH 26.4 26.0 - 34.0 pg   MCHC 30.9 30.0 - 36.0 g/dL   RDW 15.0 11.5 - 15.5 %   Platelets 302 150 - 400 K/uL   nRBC 0.0 0.0 - 0.2 %   Neutrophils Relative % 73 %   Neutro Abs 7.9 (H) 1.7 - 7.7 K/uL   Lymphocytes Relative 15 %   Lymphs Abs 1.6 0.7 - 4.0 K/uL   Monocytes Relative 8 %   Monocytes Absolute 0.8 0.1 - 1.0 K/uL   Eosinophils Relative 2 %   Eosinophils Absolute 0.2 0.0 - 0.5 K/uL   Basophils Relative 1 %   Basophils Absolute 0.1 0.0 - 0.1 K/uL   Immature Granulocytes 1 %   Abs Immature Granulocytes 0.06 0.00 - 0.07 K/uL    Comment: Performed at [facility] [address] [place], [place] [address]  Type and screen [place]     Status: None   Collection Time: 12/04/20  5:49 AM  Result Value Ref Range   ABO/RH(D) O POS    Antibody Screen NEG    Sample Expiration      12/07/2020,2359 Performed at [facility], [place] [address], [place], [place] [address]     DG Chest 1 View  Result Date: 12/04/2020 CLINICAL DATA:  Fall EXAM: CHEST  1 VIEW COMPARISON:  05/25/2014 FINDINGS: Prominent heart size without heart failure. Lungs well aerated and clear. No infiltrate or effusion.  Atherosclerotic aorta. ACDF cervical spine IMPRESSION: No active  disease. Electronically Signed   By: Franchot Gallo M.D.   On: 12/04/2020 07:58   DG Lumbar Spine Complete  Result Date: 12/04/2020 CLINICAL DATA:  Fall EXAM: LUMBAR SPINE - COMPLETE 4+ VIEW COMPARISON:  None. FINDINGS: Negative for lumbar fracture Normal alignment. Mild scoliosis. Mild to moderate disc degeneration at L3-4. Atherosclerotic aorta. IMPRESSION: Negative for fracture. Electronically Signed   By: Franchot Gallo M.D.   On: 12/04/2020 07:56   DG Pelvis 1-2 Views  Result Date: 12/04/2020 CLINICAL DATA:  Fall EXAM: PELVIS - 1-2 VIEW COMPARISON:  None. FINDINGS: Subcapital fracture left femur with impaction. No significant displacement. Left hip joint normal. No other pelvic fracture identified. IMPRESSION: Subcapital fracture left femur Electronically Signed   By: Franchot Gallo M.D.   On: 12/04/2020 07:55   DG Tibia/Fibula Left  Result Date: 12/04/2020 CLINICAL DATA:  Fall EXAM: LEFT TIBIA AND FIBULA - 2 VIEW COMPARISON:  None. FINDINGS: There is no evidence of fracture or other focal bone lesions. Soft tissues are unremarkable. IMPRESSION: Negative. Electronically Signed   By: Franchot Gallo M.D.   On: 12/04/2020 07:55   DG Knee Complete 4 Views Right  Result Date: 12/04/2020 CLINICAL DATA:  Fall EXAM: RIGHT KNEE - COMPLETE 4+ VIEW COMPARISON:  None. FINDINGS: No evidence of fracture, dislocation, or joint effusion. No evidence of arthropathy or other focal bone abnormality. Soft tissues are unremarkable. IMPRESSION: Negative. Electronically Signed   By: Franchot Gallo M.D.   On: 12/04/2020 07:57   DG Foot Complete Right  Result Date: 12/04/2020 CLINICAL DATA:  Fall EXAM: RIGHT FOOT COMPLETE - 3+ VIEW COMPARISON:  None. FINDINGS: Negative for foot fracture Hallux valgus deformity.  Otherwise no arthropathy. IMPRESSION: Negative for fracture. Electronically Signed   By: Franchot Gallo M.D.   On: 12/04/2020 07:57   DG  Femur Min 2 Views Left  Result Date: 12/04/2020 CLINICAL DATA:  Fall EXAM: LEFT FEMUR 2 VIEWS COMPARISON:  None. FINDINGS: Subcapital fracture left femur with mild impaction. Left hip joint normal. No other femur fracture. IMPRESSION: Subcapital fracture left femur Electronically Signed   By: Franchot Gallo M.D.   On: 12/04/2020 07:54    Review of Systems  HENT: Negative for ear discharge, ear pain, hearing loss and tinnitus.   Eyes: Negative for photophobia and pain.  Respiratory: Negative for cough and shortness of breath.   Cardiovascular: Negative for chest pain.  Gastrointestinal: Negative for abdominal pain, nausea and vomiting.  Genitourinary: Negative for dysuria, flank pain, frequency and urgency.  Musculoskeletal: Positive for arthralgias (LLE). Negative for back pain, myalgias and neck pain.  Neurological: Negative for dizziness and headaches.  Hematological: Does not bruise/bleed easily.  Psychiatric/Behavioral: The patient is not nervous/anxious.    Blood pressure (!) 149/63, pulse 80, temperature 98.3 F (36.8 C), resp. rate 12, SpO2 92 %. Physical Exam Constitutional:      General: She is not in acute distress.    Appearance: She is well-developed. She is not diaphoretic.  HENT:     Head: Normocephalic and atraumatic.  Eyes:     General: No scleral icterus.       Right eye: No discharge.        Left eye: No discharge.     Conjunctiva/sclera: Conjunctivae normal.  Cardiovascular:     Rate and Rhythm: Normal rate and regular rhythm.  Pulmonary:     Effort: Pulmonary effort is normal. No respiratory distress.  Musculoskeletal:     Cervical back: Normal range of motion.  Comments: LLE No traumatic wounds, ecchymosis, or rash  Mod TTP hip  No knee or ankle effusion  Knee stable to varus/ valgus and anterior/posterior stress  Sens DPN, SPN, TN intact  Motor EHL, ext, flex, evers 5/5  DP 2+, PT 0, No significant edema  Skin:    General: Skin is warm and dry.   Neurological:     Mental Status: She is alert.  Psychiatric:        Behavior: Behavior normal.     Assessment/Plan: Left hip fx -- Plan THA tomorrow with Dr. Lyla Glassing. Please keep NPO after MN. Multiple medical problems including hypertension, diabetes, hyperlipidemia, coronary artery disease, and balance issues -- IM to admit and manage. Appreciate their assistance.    Alexis Abu, PA-C Orthopedic Surgery [phone] 12/04/2020, 9:05 AM

## 2020-12-04 NOTE — Progress Notes (Signed)
Bedside shift report complete. Received patient awake,alert/orientedx4 and able to verbalize needs. NAD noted; respirations easy on room air. Continuous pulse ox on at this time. Movement/sensation to all extremities noted. Bruising to L hip/leg noted.  Patient educated on NPO diet starting at midnight tonight per orders. Whiteboard updated. All safety measures in place and personal belongings within reach.

## 2020-12-04 NOTE — ED Provider Notes (Signed)
[facility] EMERGENCY DEPARTMENT Provider Note   CSN: [id number] Arrival date & time: 12/04/20  [id number]     History Chief Complaint  Patient presents with  . Fall    EMS arrival. Possible fall on thinners. Lives at home w/ husband. Tripped on heating cord for blanket. Multiple recently. Left hip affected, no noted rotation. Cx of left leg pain and right foot pain. Given 250 NS and 162mcg Fentanyl enroute.    Alexis [name] is a 70 y.o. female.  The history is provided by the patient.  Fall  She has history of hypertension, diabetes, hyperlipidemia, coronary artery disease and comes in by ambulance after having fallen at home.  She tripped over a cord from her electric blanket as she was walking to the bathroom.  She landed on her left side and is complaining of pain in her entire left leg as well as the right foot and lower back.  She was unable to get up by herself.  She is unable to straighten her left knee.  She denies head injury or loss of consciousness.  She is not on any anticoagulants.  Of note, she had received fentanyl 100 mcg in the ambulance.  Past Medical History:  Diagnosis Date  . Anxiety   . Arthritis    "knees" (05/25/2014)  . Bursitis of knee    "both"  . Chronic back pain   . Depression   . Diabetes mellitus type II   . Diabetic peripheral neuropathy ([place]) 10/20/2018  . Diverticulitis   . Fatty liver   . Fibromyalgia   . Gastric polyp    hyperplastic  . Gastroparesis    "recently dx'd" (05/25/2014)  . GERD (gastroesophageal reflux disease)   . H/O hiatal hernia   . [id number])    "monthly" (05/25/2014)  . Heart attack ([place]) 2014   "mild"  . Hx of gastritis   . Hyperlipidemia   . Hypertension   . Irritable bowel syndrome (IBS)   . Memory difficulties 09/18/2017  . Migraine without aura, without mention of intractable migraine without mention of status migrainosus    "related to allergies; have them in the spring and fall"  (05/25/2014)  . Obesity   . Pancreatic cyst    peripancreatic cystic lesion  . Renal cyst   . Sleep apnea    "wore mask; took it off in my sleep; quit wearing it" (05/25/2014)  . Sprain of neck 03/18/2013  . Uterine cancer ([place]) dx'd 2000   surg only  . Vasculitis ([place])    "irritates my legs"  . Wears glasses     Patient Active Problem List   Diagnosis Date Noted  . Diabetic peripheral neuropathy ([place]) 10/20/2018  . Cervical myelopathy ([place]) 05/07/2018  . Type 2 diabetes mellitus ([place]) 02/18/2018  . Memory difficulties 09/18/2017  . Coronary artery calcification seen on computed tomography 04/09/2017  . Word finding difficulty 09/05/2016  . Cervicogenic headache 09/05/2016  . Palpitation 08/22/2016  . Bilateral leg edema 08/22/2016  . Obesity (BMI 30-39.9) 05/25/2014  . Precordial chest pain 05/25/2014  . Migraine without aura, without mention of intractable migraine without mention of status migrainosus 03/18/2013  . Chest pain 01/29/2013  . Constipation 02/24/2012  . DM (diabetes mellitus), type 2, uncontrolled ([place]) 08/24/2010  . Hyperlipidemia 08/24/2010  . ANXIETY 08/24/2010  . Depression 08/24/2010  . Essential hypertension 08/24/2010  . GERD 08/24/2010  . IRRITABLE BOWEL SYNDROME 08/24/2010  . FATTY LIVER DISEASE 08/24/2010  . ARTHRITIS 08/24/2010  .  Fibromyalgia 08/24/2010  . OSA (obstructive sleep apnea) 08/24/2010  . ABDOMINAL PAIN-RUQ 08/24/2010  . UTERINE CANCER, HX OF 08/24/2010  . GASTRITIS, HX OF 08/24/2010    Past Surgical History:  Procedure Laterality Date  . ANTERIOR CERVICAL DECOMPRESSION/DISCECTOMY FUSION 4 LEVELS N/A 05/07/2018   Procedure: ANTERIOR CERVICAL DECOMPRESSION FUSION, CERVICAL 4-5,CERVICAL 5-6, CERVICAL 6-7 WITH INSTRUMENTATION AND ALLOGRAFT;  Surgeon: Phylliss Bob, MD;  Location: [facility];  Service: Orthopedics;  Laterality: N/A;  . APPENDECTOMY  ~ 1967  . BREAST CYST EXCISION Right 1990  . COLONOSCOPY    . LAPAROSCOPIC  CHOLECYSTECTOMY  1990  . LEFT HEART CATHETERIZATION WITH CORONARY ANGIOGRAM N/A 05/26/2014   Procedure: LEFT HEART CATHETERIZATION WITH CORONARY ANGIOGRAM;  Surgeon: Troy Sine, MD;  Location: [facility] CATH LAB;  Service: Cardiovascular;  Laterality: N/A;  . SINUS SURGERY WITH INSTATRAK  2000  . TUBAL LIGATION  ~ 1982  . VAGINAL HYSTERECTOMY  2000     OB History    Gravida      Para      Term      Preterm      AB      Living  2     SAB      IAB      Ectopic      Multiple      Live Births              Family History  Problem Relation Age of Onset  . Breast cancer Sister   . Colon polyps Father   . Heart disease Father   . Colon cancer Maternal Uncle   . Ovarian cancer Maternal Aunt   . Stomach cancer Maternal Aunt   . Diabetes Maternal Aunt   . Heart disease Maternal Uncle   . Heart disease Other        Grandparents  . Irritable bowel syndrome Daughter     Social History   Tobacco Use  . Smoking status: Never Smoker  . Smokeless tobacco: Never Used  Vaping Use  . Vaping Use: Never used  Substance Use Topics  . Alcohol use: No  . Drug use: No    Home Medications Prior to Admission medications   Medication Sig Start Date End Date Taking? Authorizing Provider  acetaminophen (TYLENOL) 500 MG tablet Take 500 mg by mouth every 6 (six) hours as needed for moderate pain or headache.     [provider]  ALPRAZolam Duanne Moron) 0.5 MG tablet Take 0.5-1 mg by mouth at bedtime as needed for anxiety.     [provider]  amLODipine (NORVASC) 5 MG tablet Take 5 mg by mouth daily. 08/27/19   [provider]  atorvastatin (LIPITOR) 20 MG tablet Take 20 mg by mouth daily.    [provider]  b complex vitamins tablet Take 1 tablet by mouth daily.    [provider]  clopidogrel (PLAVIX) 75 MG tablet TAKE 1 TABLET BY MOUTH EVERY DAY 12/30/19   Kathrynn Ducking, MD  cyanocobalamin 1000 MCG tablet Vitamin B-12  1,000 mcg  tablet  Take by oral route.    [provider]  FARXIGA 10 MG TABS tablet Take 10 mg by mouth daily. 09/10/19   [provider]  fish oil-omega-3 fatty acids 1000 MG capsule Take 1 g by mouth at bedtime.     [provider]  furosemide (LASIX) 20 MG tablet TAKE 1 TABLET (20 MG TOTAL) BY MOUTH AS NEEDED FOR EDEMA. 08/20/19   Eulas Post,  Isaac Laud, PA  metFORMIN (GLUCOPHAGE-XR) 500 MG 24 hr tablet SMARTSIG:1 Pill By Mouth Daily 08/24/19   [provider]  metoprolol succinate (TOPROL-XL) 50 MG 24 hr tablet Take 100 mg by mouth 2 (two) times daily. Take with or immediately following a meal.    [provider]  pregabalin (LYRICA) 50 MG capsule Take 50 mg by mouth 2 (two) times daily.    [provider]  RABEprazole (ACIPHEX) 20 MG tablet Take 1 tablet (20 mg total) by mouth 2 (two) times daily. 04/22/12   Lafayette Dragon, MD  rizatriptan (MAXALT) 10 MG tablet Take 10 mg by mouth as needed for migraine. May repeat in 2 hours if needed    [provider]  tamsulosin (FLOMAX) 0.4 MG CAPS capsule Take 0.4 mg by mouth at bedtime.  07/17/16   [provider]  traMADol (ULTRAM) 50 MG tablet Take 100 mg by mouth 2 (two) times daily as needed. 09/13/19   [provider]  TRESIBA FLEXTOUCH 100 UNIT/ML FlexTouch Pen SMARTSIG:25 Unit(s) SUB-Q Daily 09/29/19   [provider]  TRULICITY 1.5 WJ/1.9JY SOPN SMARTSIG:0.5 Milliliter(s) SUB-Q Once a Week 10/19/19   [provider]  valsartan (DIOVAN) 320 MG tablet Take 320 mg by mouth daily. 09/10/19   [provider]  venlafaxine XR (EFFEXOR-XR) 150 MG 24 hr capsule Take 150 mg by mouth 2 (two) times daily.    [provider]  zaleplon (SONATA) 5 MG capsule Take 5 mg by mouth at bedtime.    [provider]    Allergies    Aspirin, Codeine, Naproxen, and Sulfonamide derivatives  Review of Systems   Review of Systems  All other systems reviewed and are  negative.   Physical Exam Updated Vital Signs Pulse 82   Temp 98.3 F (36.8 C)   Resp 16   SpO2 93%   Physical Exam Vitals and nursing note reviewed.   70 year old female, resting comfortably and in no acute distress. Vital signs are normal. Oxygen saturation is 93%, which is normal. Head is normocephalic and atraumatic. PERRLA, EOMI. Oropharynx is clear. Neck is nontender and supple without adenopathy or JVD. Back is moderately tender over the lower lumbar area.  There is no CVA tenderness. Lungs are clear without rales, wheezes, or rhonchi. Chest is nontender. Heart has regular rate and rhythm without murmur. Abdomen is soft, flat, nontender without masses or hepatosplenomegaly and peristalsis is normoactive. Extremities: She keeps the left hip and left knee flexed.  There is tenderness to palpation over the left hip, thigh.  There is some induration of the soft tissues of the proximal and middle left thigh laterally.  There is also some tenderness to palpation in the distal left lower leg.  There is no swelling or deformity noted of the right foot, but there is some pain in the popliteal area with passive range of motion of the right knee.  No upper extremity injury is seen. Skin is warm and dry without rash. Neurologic: Mental status is normal, cranial nerves are intact, there are no motor or sensory deficits.  ED Results / Procedures / Treatments   Labs (all labs ordered are listed, but only abnormal results are displayed) Labs Reviewed  COMPREHENSIVE METABOLIC PANEL - Abnormal; Notable for the following components:      Result Value   Glucose, Bld 243 (*)    Alkaline Phosphatase 130 (*)    All other components within normal limits  CBC WITH DIFFERENTIAL/PLATELET - Abnormal;  Notable for the following components:   WBC 10.7 (*)    Neutro Abs 7.9 (*)    All other components within normal limits  TYPE AND SCREEN   Radiology No results found.  Procedures Procedures    Medications Ordered in ED Medications  morphine 4 MG/ML injection 4 mg (has no administration in time range)    ED Course  I have reviewed the triage vital signs and the nursing notes.  Pertinent labs & imaging results that were available during my care of the patient were reviewed by me and considered in my medical decision making (see chart for details).   MDM Rules/Calculators/A&P                         Fall with injury to left leg and lower back.  She is being sent for x-rays.  Old records were reviewed, and she does have a prior ED visit for a fall.  She is given morphine for pain.  Screening labs do show mild elevation of alkaline phosphatase, reason unclear.  X-rays are still pending.  Case is signed out to Dr. Alvino Chapel.  Final Clinical Impression(s) / ED Diagnoses Final diagnoses:  Fall in home, initial encounter  Pain in left thigh  Alkaline phosphatase elevation    Rx / DC Orders ED Discharge Orders    None       Delora Fuel, MD 18/56/31 [phone]

## 2020-12-04 NOTE — ED Provider Notes (Signed)
  Physical Exam  BP (!) 149/63   Pulse 80   Temp 98.3 F (36.8 C)   Resp 12   SpO2 92%   Physical Exam  ED Course/Procedures     Procedures  MDM  Received patient in signout.  Fall.  Mechanical.  Left hip pain.  Also right foot pain.  Right foot x-ray reassuring.  However does have subcapital fracture on left hip.  Discussed with Hilbert Odor from orthopedic surgery.  Has seen [facility] in the past but it was for her neck and saw Dr. Lynann Bologna.  Will admit to internal medicine.  Patient has not eaten yet today.  I have added a COVID test on       Davonna Belling, MD 12/04/20 [phone]

## 2020-12-05 ENCOUNTER — Inpatient Hospital Stay (HOSPITAL_COMMUNITY): Payer: Medicare Other | Admitting: Certified Registered Nurse Anesthetist

## 2020-12-05 ENCOUNTER — Encounter (HOSPITAL_COMMUNITY)
Admission: EM | Disposition: A | Discharge status: Skilled Nursing Facility | Payer: Self-pay | Source: Home / Self Care | Attending: Internal Medicine

## 2020-12-05 ENCOUNTER — Inpatient Hospital Stay (HOSPITAL_COMMUNITY): Payer: Medicare Other

## 2020-12-05 DIAGNOSIS — S72012A Unspecified intracapsular fracture of left femur, initial encounter for closed fracture: Secondary | ICD-10-CM | POA: Diagnosis not present

## 2020-12-05 HISTORY — PX: TOTAL HIP ARTHROPLASTY: SHX124

## 2020-12-05 LAB — CBC
HCT: 41.1 % (ref 36.0–46.0)
Hemoglobin: 12.9 g/dL (ref 12.0–15.0)
MCH: 26 pg (ref 26.0–34.0)
MCHC: 31.4 g/dL (ref 30.0–36.0)
MCV: 82.9 fL (ref 80.0–100.0)
Platelets: 283 10*3/uL (ref 150–400)
RBC: 4.96 MIL/uL (ref 3.87–5.11)
RDW: 15.3 % (ref 11.5–15.5)
WBC: 13.6 10*3/uL — ABNORMAL HIGH (ref 4.0–10.5)
nRBC: 0 % (ref 0.0–0.2)

## 2020-12-05 LAB — GLUCOSE, CAPILLARY
Glucose-Capillary: 281 mg/dL — ABNORMAL HIGH (ref 70–99)
Glucose-Capillary: 261 mg/dL — ABNORMAL HIGH (ref 70–99)
Glucose-Capillary: 252 mg/dL — ABNORMAL HIGH (ref 70–99)
Glucose-Capillary: 222 mg/dL — ABNORMAL HIGH (ref 70–99)
Glucose-Capillary: 299 mg/dL — ABNORMAL HIGH (ref 70–99)

## 2020-12-05 LAB — BASIC METABOLIC PANEL
Anion gap: 9 (ref 5–15)
BUN: 20 mg/dL (ref 8–23)
CO2: 27 mmol/L (ref 22–32)
Calcium: 9.1 mg/dL (ref 8.9–10.3)
Chloride: 99 mmol/L (ref 98–111)
Creatinine, Ser: 1.14 mg/dL — ABNORMAL HIGH (ref 0.44–1.00)
GFR, Estimated: 52 mL/min — ABNORMAL LOW (ref >60)
Glucose, Bld: 305 mg/dL — ABNORMAL HIGH (ref 70–99)
Potassium: 4 mmol/L (ref 3.5–5.1)
Sodium: 135 mmol/L (ref 135–145)

## 2020-12-05 LAB — HIV ANTIBODY (ROUTINE TESTING W REFLEX): HIV Screen 4th Generation wRfx: NONREACTIVE

## 2020-12-05 SURGERY — ARTHROPLASTY, HIP, TOTAL, ANTERIOR APPROACH
Anesthesia: General | Site: Hip | Laterality: Left

## 2020-12-05 MED ORDER — CLOPIDOGREL BISULFATE 75 MG PO TABS
75.0000 mg | ORAL_TABLET | Freq: Every day | ORAL | Status: DC
  Administered 2020-12-06 – 2020-12-07 (×2): 75 mg via ORAL
  Filled 2020-12-05 (×4): qty 1

## 2020-12-05 MED ORDER — CEFAZOLIN SODIUM-DEXTROSE 2-4 GM/100ML-% IV SOLN
2.0000 g | Freq: Four times a day (QID) | INTRAVENOUS | Status: AC
  Administered 2020-12-05 – 2020-12-06 (×2): 2 g via INTRAVENOUS
  Filled 2020-12-05 (×2): qty 100

## 2020-12-05 MED ORDER — DOCUSATE SODIUM 100 MG PO CAPS
100.0000 mg | ORAL_CAPSULE | Freq: Two times a day (BID) | ORAL | Status: DC
  Administered 2020-12-05 – 2020-12-09 (×9): 100 mg via ORAL
  Filled 2020-12-05 (×9): qty 1

## 2020-12-05 MED ORDER — PROPOFOL 1000 MG/100ML IV EMUL
INTRAVENOUS | Status: AC
  Filled 2020-12-05: qty 100

## 2020-12-05 MED ORDER — ONDANSETRON HCL 4 MG/2ML IJ SOLN
4.0000 mg | Freq: Once | INTRAMUSCULAR | Status: DC | PRN
Start: 2020-12-05 — End: 2020-12-05

## 2020-12-05 MED ORDER — FENTANYL CITRATE (PF) 250 MCG/5ML IJ SOLN
INTRAMUSCULAR | Status: AC
  Filled 2020-12-05: qty 5

## 2020-12-05 MED ORDER — MENTHOL 3 MG MT LOZG: 1.0000 | LOZENGE | OROMUCOSAL | Status: DC | PRN

## 2020-12-05 MED ORDER — MIDAZOLAM HCL 2 MG/2ML IJ SOLN
INTRAMUSCULAR | Status: AC
  Filled 2020-12-05: qty 2

## 2020-12-05 MED ORDER — POVIDONE-IODINE 10 % EX SWAB
2.0000 "application " | Freq: Once | CUTANEOUS | Status: AC
  Administered 2020-12-05: 2 via TOPICAL

## 2020-12-05 MED ORDER — MIDAZOLAM HCL 5 MG/5ML IJ SOLN
INTRAMUSCULAR | Status: DC | PRN
  Administered 2020-12-05: 2 mg via INTRAVENOUS

## 2020-12-05 MED ORDER — BUPIVACAINE-EPINEPHRINE 0.5% -1:200000 IJ SOLN
INTRAMUSCULAR | Status: AC
  Filled 2020-12-05: qty 1

## 2020-12-05 MED ORDER — ONDANSETRON HCL 4 MG/2ML IJ SOLN: 4.0000 mg | Freq: Four times a day (QID) | INTRAMUSCULAR | Status: DC | PRN

## 2020-12-05 MED ORDER — BUPIVACAINE-EPINEPHRINE (PF) 0.5% -1:200000 IJ SOLN
INTRAMUSCULAR | Status: DC | PRN
  Administered 2020-12-05: 30 mL

## 2020-12-05 MED ORDER — PROPOFOL 10 MG/ML IV BOLUS
INTRAVENOUS | Status: AC
  Filled 2020-12-05: qty 20

## 2020-12-05 MED ORDER — SODIUM CHLORIDE 0.9 % IV SOLN: INTRAVENOUS | Status: DC

## 2020-12-05 MED ORDER — INSULIN ASPART 100 UNIT/ML ~~LOC~~ SOLN
SUBCUTANEOUS | Status: AC
  Filled 2020-12-05: qty 1

## 2020-12-05 MED ORDER — ONDANSETRON HCL 4 MG/2ML IJ SOLN
INTRAMUSCULAR | Status: DC | PRN
  Administered 2020-12-05: 4 mg via INTRAVENOUS

## 2020-12-05 MED ORDER — CEFAZOLIN SODIUM-DEXTROSE 2-4 GM/100ML-% IV SOLN
2.0000 g | INTRAVENOUS | Status: AC
Start: 2020-12-05 — End: 2020-12-05
  Administered 2020-12-05: 2 g via INTRAVENOUS
  Filled 2020-12-05: qty 100

## 2020-12-05 MED ORDER — TRANEXAMIC ACID-NACL 1000-0.7 MG/100ML-% IV SOLN
INTRAVENOUS | Status: DC | PRN
  Administered 2020-12-05: 1000 mg via INTRAVENOUS

## 2020-12-05 MED ORDER — SODIUM CHLORIDE (PF) 0.9 % IJ SOLN
INTRAMUSCULAR | Status: DC | PRN
  Administered 2020-12-05: 30 mL via INTRAVENOUS

## 2020-12-05 MED ORDER — SODIUM CHLORIDE 0.9 % IV SOLN
INTRAVENOUS | Status: AC | PRN
  Administered 2020-12-05: 1000 mL via INTRAMUSCULAR

## 2020-12-05 MED ORDER — ROCURONIUM BROMIDE 10 MG/ML (PF) SYRINGE
PREFILLED_SYRINGE | INTRAVENOUS | Status: DC | PRN
  Administered 2020-12-05: 60 mg via INTRAVENOUS

## 2020-12-05 MED ORDER — METOCLOPRAMIDE HCL 5 MG PO TABS
5.0000 mg | ORAL_TABLET | Freq: Three times a day (TID) | ORAL | Status: DC | PRN
  Filled 2020-12-05: qty 2

## 2020-12-05 MED ORDER — MORPHINE SULFATE (PF) 2 MG/ML IV SOLN: 2.0000 mg | INTRAVENOUS | Status: DC | PRN

## 2020-12-05 MED ORDER — PHENOL 1.4 % MT LIQD: 1.0000 | OROMUCOSAL | Status: DC | PRN

## 2020-12-05 MED ORDER — INSULIN ASPART 100 UNIT/ML ~~LOC~~ SOLN
SUBCUTANEOUS | Status: DC | PRN
  Administered 2020-12-05: 4 [IU] via SUBCUTANEOUS

## 2020-12-05 MED ORDER — KETOROLAC TROMETHAMINE 30 MG/ML IJ SOLN
INTRAMUSCULAR | Status: AC
  Filled 2020-12-05: qty 1

## 2020-12-05 MED ORDER — ONDANSETRON HCL 4 MG PO TABS: 4.0000 mg | ORAL_TABLET | Freq: Four times a day (QID) | ORAL | Status: DC | PRN

## 2020-12-05 MED ORDER — LIDOCAINE 2% (20 MG/ML) 5 ML SYRINGE
INTRAMUSCULAR | Status: DC | PRN
  Administered 2020-12-05: 4 mg via INTRAVENOUS

## 2020-12-05 MED ORDER — FENTANYL CITRATE (PF) 100 MCG/2ML IJ SOLN: 25.0000 ug | INTRAMUSCULAR | Status: DC | PRN

## 2020-12-05 MED ORDER — PHENYLEPHRINE 40 MCG/ML (10ML) SYRINGE FOR IV PUSH (FOR BLOOD PRESSURE SUPPORT)
PREFILLED_SYRINGE | INTRAVENOUS | Status: DC | PRN
  Administered 2020-12-05 (×5): 80 ug via INTRAVENOUS

## 2020-12-05 MED ORDER — 0.9 % SODIUM CHLORIDE (POUR BTL) OPTIME
TOPICAL | Status: DC | PRN
  Administered 2020-12-05: 1000 mL

## 2020-12-05 MED ORDER — CHLORHEXIDINE GLUCONATE 0.12 % MT SOLN
OROMUCOSAL | Status: AC
  Administered 2020-12-05: 15 mL
  Filled 2020-12-05: qty 15

## 2020-12-05 MED ORDER — PHENYLEPHRINE HCL-NACL 10-0.9 MG/250ML-% IV SOLN
INTRAVENOUS | Status: DC | PRN
  Administered 2020-12-05: 25 ug/min via INTRAVENOUS

## 2020-12-05 MED ORDER — SUGAMMADEX SODIUM 200 MG/2ML IV SOLN
INTRAVENOUS | Status: DC | PRN
  Administered 2020-12-05: 200 mg via INTRAVENOUS

## 2020-12-05 MED ORDER — METOCLOPRAMIDE HCL 5 MG/ML IJ SOLN
5.0000 mg | Freq: Three times a day (TID) | INTRAMUSCULAR | Status: DC | PRN
Start: 2020-12-05 — End: 2020-12-13

## 2020-12-05 MED ORDER — FENTANYL CITRATE (PF) 100 MCG/2ML IJ SOLN
INTRAMUSCULAR | Status: DC | PRN
  Administered 2020-12-05 (×2): 50 ug via INTRAVENOUS

## 2020-12-05 MED ORDER — TRANEXAMIC ACID-NACL 1000-0.7 MG/100ML-% IV SOLN
INTRAVENOUS | Status: AC
  Filled 2020-12-05: qty 100

## 2020-12-05 MED ORDER — PROPOFOL 10 MG/ML IV BOLUS
INTRAVENOUS | Status: DC | PRN
  Administered 2020-12-05: 100 mg via INTRAVENOUS

## 2020-12-05 MED ORDER — LACTATED RINGERS IV SOLN: INTRAVENOUS | Status: DC | PRN

## 2020-12-05 MED ORDER — CHLORHEXIDINE GLUCONATE 4 % EX LIQD: 60.0000 mL | Freq: Once | CUTANEOUS | Status: DC

## 2020-12-05 MED ORDER — KETOROLAC TROMETHAMINE 30 MG/ML IJ SOLN
INTRAMUSCULAR | Status: DC | PRN
  Administered 2020-12-05: 30 mg via INTRAVENOUS

## 2020-12-05 MED ORDER — CEFAZOLIN SODIUM 1 G IJ SOLR
INTRAMUSCULAR | Status: AC
  Filled 2020-12-05: qty 10

## 2020-12-05 SURGICAL SUPPLY — 75 items
ADH SKN CLS APL DERMABOND .7 (GAUZE/BANDAGES/DRESSINGS) ×2
ADH SKN CLS LQ APL DERMABOND (GAUZE/BANDAGES/DRESSINGS) ×3
ALCOHOL 70% 16 OZ (MISCELLANEOUS) ×2 IMPLANT
APL PRP STRL LF DISP 70% ISPRP (MISCELLANEOUS) ×1
BLADE CLIPPER SURG (BLADE) IMPLANT
CHLORAPREP W/TINT 26 (MISCELLANEOUS) ×2 IMPLANT
COVER SURGICAL LIGHT HANDLE (MISCELLANEOUS) ×2 IMPLANT
COVER WAND RF STERILE (DRAPES) ×2 IMPLANT
CUP SECTOR GRIPTON 50MM (Cup) ×1 IMPLANT
DERMABOND ADHESIVE PROPEN (GAUZE/BANDAGES/DRESSINGS) ×3
DERMABOND ADVANCED (GAUZE/BANDAGES/DRESSINGS) ×2
DERMABOND ADVANCED .7 DNX12 (GAUZE/BANDAGES/DRESSINGS) ×2 IMPLANT
DERMABOND ADVANCED .7 DNX6 (GAUZE/BANDAGES/DRESSINGS) IMPLANT
DRAPE C-ARM 42X72 X-RAY (DRAPES) ×2 IMPLANT
DRAPE STERI IOBAN 125X83 (DRAPES) ×2 IMPLANT
DRAPE U-SHAPE 47X51 STRL (DRAPES) ×6 IMPLANT
DRESSING AQUACEL AG SP 3.5X10 (GAUZE/BANDAGES/DRESSINGS) IMPLANT
DRSG AQUACEL AG ADV 3.5X10 (GAUZE/BANDAGES/DRESSINGS) ×2 IMPLANT
DRSG AQUACEL AG SP 3.5X10 (GAUZE/BANDAGES/DRESSINGS) ×2
ELECT BLADE 4.0 EZ CLEAN MEGAD (MISCELLANEOUS) ×2
ELECT PENCIL ROCKER SW 15FT (MISCELLANEOUS) ×2 IMPLANT
ELECT REM PT RETURN 9FT ADLT (ELECTROSURGICAL) ×2
ELECTRODE BLDE 4.0 EZ CLN MEGD (MISCELLANEOUS) ×1 IMPLANT
ELECTRODE REM PT RTRN 9FT ADLT (ELECTROSURGICAL) ×1 IMPLANT
EVACUATOR 1/8 PVC DRAIN (DRAIN) IMPLANT
GLOVE BIO SURGEON STRL SZ8.5 (GLOVE) ×4 IMPLANT
GLOVE BIOGEL M 7.0 STRL (GLOVE) ×2 IMPLANT
GLOVE BIOGEL PI IND STRL 7.5 (GLOVE) ×1 IMPLANT
GLOVE BIOGEL PI IND STRL 8.5 (GLOVE) ×1 IMPLANT
GLOVE BIOGEL PI INDICATOR 7.5 (GLOVE) ×1
GLOVE BIOGEL PI INDICATOR 8.5 (GLOVE) ×1
GOWN STRL REUS W/ TWL LRG LVL3 (GOWN DISPOSABLE) ×2 IMPLANT
GOWN STRL REUS W/ TWL XL LVL3 (GOWN DISPOSABLE) ×1 IMPLANT
GOWN STRL REUS W/TWL 2XL LVL3 (GOWN DISPOSABLE) ×2 IMPLANT
GOWN STRL REUS W/TWL LRG LVL3 (GOWN DISPOSABLE) ×4
GOWN STRL REUS W/TWL XL LVL3 (GOWN DISPOSABLE) ×2
HANDPIECE INTERPULSE COAX TIP (DISPOSABLE) ×2
HEAD FEMORAL 32 CERAMIC (Hips) ×1 IMPLANT
HOOD PEEL AWAY FACE SHEILD DIS (HOOD) ×4 IMPLANT
JET LAVAGE IRRISEPT WOUND (IRRIGATION / IRRIGATOR) ×2
KIT BASIN OR (CUSTOM PROCEDURE TRAY) ×2 IMPLANT
KIT TURNOVER KIT B (KITS) ×2 IMPLANT
LAVAGE JET IRRISEPT WOUND (IRRIGATION / IRRIGATOR) ×1 IMPLANT
LINER ACETABULAR 32X50 (Liner) ×1 IMPLANT
MANIFOLD NEPTUNE II (INSTRUMENTS) ×2 IMPLANT
MARKER SKIN DUAL TIP RULER LAB (MISCELLANEOUS) ×4 IMPLANT
NDL SPNL 18GX3.5 QUINCKE PK (NEEDLE) ×1 IMPLANT
NEEDLE SPNL 18GX3.5 QUINCKE PK (NEEDLE) ×2 IMPLANT
NS IRRIG 1000ML POUR BTL (IV SOLUTION) ×2 IMPLANT
PACK TOTAL JOINT (CUSTOM PROCEDURE TRAY) ×2 IMPLANT
PACK UNIVERSAL I (CUSTOM PROCEDURE TRAY) ×2 IMPLANT
PAD ARMBOARD 7.5X6 YLW CONV (MISCELLANEOUS) ×4 IMPLANT
SAW OSC TIP CART 19.5X105X1.3 (SAW) ×2 IMPLANT
SEALER BIPOLAR AQUA 6.0 (INSTRUMENTS) ×1 IMPLANT
SET HNDPC FAN SPRY TIP SCT (DISPOSABLE) ×1 IMPLANT
SOL PREP POV-IOD 4OZ 10% (MISCELLANEOUS) ×1 IMPLANT
STAPLER SKIN 35 WIDE (STAPLE) ×1 IMPLANT
STEM TRI LOC BPS SZ7 W GRIPTON (Hips) IMPLANT
SUT ETHIBOND NAB CT1 #1 30IN (SUTURE) ×4 IMPLANT
SUT MNCRL AB 3-0 PS2 18 (SUTURE) ×2 IMPLANT
SUT MON AB 2-0 CT1 36 (SUTURE) ×2 IMPLANT
SUT VIC AB 1 CT1 27 (SUTURE) ×2
SUT VIC AB 1 CT1 27XBRD ANBCTR (SUTURE) ×1 IMPLANT
SUT VIC AB 2-0 CT1 27 (SUTURE) ×2
SUT VIC AB 2-0 CT1 TAPERPNT 27 (SUTURE) ×1 IMPLANT
SUT VLOC 180 0 24IN GS25 (SUTURE) ×2 IMPLANT
SYR 50ML LL SCALE MARK (SYRINGE) ×2 IMPLANT
TOWEL GREEN STERILE (TOWEL DISPOSABLE) ×2 IMPLANT
TOWEL GREEN STERILE FF (TOWEL DISPOSABLE) ×2 IMPLANT
TRAY CATH 16FR W/PLASTIC CATH (SET/KITS/TRAYS/PACK) IMPLANT
TRAY FOLEY W/BAG SLVR 16FR (SET/KITS/TRAYS/PACK)
TRAY FOLEY W/BAG SLVR 16FR ST (SET/KITS/TRAYS/PACK) IMPLANT
TRI LOC BPS SZ 7 W GRIPTON (Hips) ×2 IMPLANT
TUBE SUCTION HIGH CAP CLEAR NV (SUCTIONS) ×1 IMPLANT
WATER STERILE IRR 1000ML POUR (IV SOLUTION) ×6 IMPLANT

## 2020-12-05 NOTE — TOC CAGE-AID Note (Signed)
Transition of Care [facility]) - CAGE-AID Screening   Patient Details  Name: Alexis [name] MRN: [id number] Date of Birth: 1951/06/01  Transition of Care [facility]) CM/SW Contact:    Clovis Cao, RN Phone Number: [phone] 12/05/2020, 5:05 PM   Clinical Narrative: Pt denies alcohol or drug use.   CAGE-AID Screening:    Have You Ever Felt You Ought to Cut Down on Your Drinking or Drug Use?: No Have People Annoyed You By Critizing Your Drinking Or Drug Use?: No Have You Felt Bad Or Guilty About Your Drinking Or Drug Use?: No Have You Ever Had a Drink or Used Drugs First Thing In The Morning to Steady Your Nerves or to Get Rid of a Hangover?: No CAGE-AID Score: 0  Substance Abuse Education Offered: No

## 2020-12-05 NOTE — Progress Notes (Signed)
Initial Nutrition Assessment  DOCUMENTATION CODES:   Not applicable  INTERVENTION:   Once diet advanced:   Ensure Enlive po BID, each supplement provides 350 kcal and 20 grams of protein  MVI daily   NUTRITION DIAGNOSIS:   Increased nutrient needs related to hip fracture as evidenced by estimated needs.  GOAL:   Patient will meet greater than or equal to 90% of their needs  MONITOR:   PO intake,Supplement acceptance,Weight trends,Labs,I & O's  REASON FOR ASSESSMENT:   Consult Hip fracture protocol  ASSESSMENT:   Patient with PMH significant for HTN, HLD, CAD, DM with peripheral neuropathy, memory difficulties, uterine cancer s/p hysterectomy, and OSA. Presents this admission after mechanical fall resulting in L hip fracture.   4/26- s/p L total hip arthroplasty   Patient remains in PACU. Will attempt to obtain nutrition and weight history at later date if possible. RD to provide supplementation to maximize kcal and protein this admission.   Need admission weight to assess for weight loss.   Drips: NS @ 75 ml/hr  Medications: SS novolog, senokot Labs: CBG [id number]  Diet Order:   Diet Order            Diet NPO time specified  Diet effective midnight                 EDUCATION NEEDS:   Not appropriate for education at this time  Skin:  Skin Assessment: Skin Integrity Issues: Skin Integrity Issues:: Incisions Incisions: L hip  Last BM:  4/24  Height:   Ht Readings from Last 1 Encounters:  11/07/20 5\' 5"  (1.651 m)    Weight:   Wt Readings from Last 1 Encounters:  11/07/20 83.7 kg    BMI:  There is no height or weight on file to calculate BMI.  Estimated Nutritional Needs:   Kcal:  1800-2000 kcal  Protein:  90-105 grams  Fluid:  >/= 1.8 L/day  Mariana Single RD, LDN Clinical Nutrition Pager listed in [place]

## 2020-12-05 NOTE — Interval H&P Note (Signed)
History and Physical Interval Note:  12/05/2020 12:14 PM  Alexis [name]  has presented today for surgery, with the diagnosis of Left hip fx.  The various methods of treatment have been discussed with the patient and family. After consideration of risks, benefits and other options for treatment, the patient has consented to  Procedure(s): TOTAL HIP ARTHROPLASTY ANTERIOR APPROACH (Left) as a surgical intervention.  The patient's history has been reviewed, patient examined, no change in status, stable for surgery.  I have reviewed the patient's chart and labs.  Questions were answered to the patient's satisfaction.    The risks, benefits, and alternatives were discussed with the patient. There are risks associated with the surgery including, but not limited to, problems with anesthesia (death), infection, instability (giving out of the joint), dislocation, differences in leg length/angulation/rotation, fracture of bones, loosening or failure of implants, hematoma (blood accumulation) which may require surgical drainage, blood clots, pulmonary embolism, nerve injury (foot drop and lateral thigh numbness), and blood vessel injury. The patient understands these risks and elects to proceed.    Hilton Cork Sitlaly Gudiel

## 2020-12-05 NOTE — Progress Notes (Signed)
PROGRESS NOTE    Alexis [name]  TMH:[id number] DOB: [birth year] DOA: 12/04/2020 PCP: Deland Pretty, MD   Chief Complain: Fall  Brief Narrative: Patient is a 70 year old female with history of hypertension, hyperlipidemia, coronary artery disease, diabetes type 2 with peripheral neuropathy, uterine cancer status post hysterectomy, OSA  from home after falling at home.  No history of loss of consciousness or head injury.  After the fall ,she immediately developed pain on the right hip and was unable to bear weight.  On presentation, she was hypertensive, x-ray showed subcapital fracture of the left femur.  Orthopedics consulted and plan is for ORIF.  Assessment & Plan:   Principal Problem:   Subcapital fracture of left femur (HCC) Active Problems:   Hyperlipidemia   Anxiety and depression   OSA (obstructive sleep apnea)   Coronary artery calcification seen on computed tomography   Type 2 diabetes mellitus (HCC)   Leukocytosis   Fall at home, initial encounter   Left subcapital femur fracture: Secondary to fall.  It was a mechanical fall after she tripped over her heating blanket cord.  No history of loss of consciousness or head injury.  Orthopedics consulted.  Plan for ORIF Continue pain management.  SCD for DVT prophylaxis for now.  PT/OT will be consulted after surgery.  She may need a skilled nursing facility on discharge  Leukocytosis: Trended up.  Most likely reactive.  Continue to monitor.    Mild AKI: We will initiate gentle IV fluids.  Check BMP tomorrow  Diabetes type 2: A1c of 10.3   Takes Farxiga, metformin, Tresiba, dulaglutide at home.  Monitor blood sugars.  Continue current insulin regimen.  She needs to strictly monitor her blood sugars and follow-up closely with her PCP for further management of her diabetes.  Will request for diabetic coordinator consultation  Coronary artery disease: Recently had coronary CTA which showed diffuse nonobstructive coronary artery  disease.  On statin and Plavix at home.  Will resume Plavix after surgery.  Hypertension: Mildly hypertensive on presentation.  Monitor blood pressure.  Continue current regimen .  Use as needed medications for severe hypertension  Hyperlipidemia: On Lipitor  Anxiety/depression: On venlafaxine, on Ativan as needed  OSA: Does not use CPAP  GERD: Protonix  Balance issues: Daughter said that she has not had balance issues and  Ha high risk for falling.  PT/OT will be consulted after orthopedic procedure             DVT prophylaxis:SCD Code Status: Full Family Communication: Daughter at the bedside Status is: Inpatient  Remains inpatient appropriate because:Inpatient level of care appropriate due to severity of illness   Dispo: The patient is from: Home              Anticipated d/c is to: SNF              Patient currently is not medically stable to d/c.   Difficult to place patient No     Consultants: Ortho  Procedures:None yet  Antimicrobials:  Anti-infectives (From admission, onward)   None      Subjective: Patient seen and examined at the bedside this morning.  Hemodynamically stable during my evaluation.  Daughter at the bedside.  Complains of pain on the left hip.  Awaiting surgery  Objective: Vitals:   12/04/20 1644 12/04/20 1931 12/05/20 0600 12/05/20 0700  BP: 127/64 (!) 114/56 (!) 149/77 (!) 145/65  Pulse: 77 74 79 81  Resp: 16 16 16 17   Temp: 98.7  F (37.1 C) 98.2 F (36.8 C) 98.3 F (36.8 C) 98.2 F (36.8 C)  TempSrc: Oral Oral Oral Oral  SpO2: 96% 90% 96% 94%    Intake/Output Summary (Last 24 hours) at 12/05/2020 4782 Last data filed at 12/05/2020 0800 Gross per 24 hour  Intake 600 ml  Output 50 ml  Net 550 ml   There were no vitals filed for this visit.  Examination:  General exam: Appears calm and comfortable ,Not in distress,average built, pleasant lady, obese HEENT:PERRL,Oral mucosa moist, Ear/Nose normal on gross  exam Respiratory system: Bilateral equal air entry, normal vesicular breath sounds, no wheezes or crackles  Cardiovascular system: S1 & S2 heard, RRR. No JVD, murmurs, rubs, gallops or clicks. No pedal edema. Gastrointestinal system: Abdomen is nondistended, soft and nontender. No organomegaly or masses felt. Normal bowel sounds heard. Central nervous system: Alert and oriented. No focal neurological deficits. Extremities: No edema, no clubbing ,no cyanosis, tenderness on the left hip Skin: Ecchymosis on the left lower quadrant of the abdomen, no ulcers,no icterus ,no pallor   Data Reviewed: I have personally reviewed following labs and imaging studies  CBC: Recent Labs  Lab 12/04/20 0549 12/05/20 0416  WBC 10.7* 13.6*  NEUTROABS 7.9*  --   HGB 12.5 12.9  HCT 40.4 41.1  MCV 85.2 82.9  PLT 302 956   Basic Metabolic Panel: Recent Labs  Lab 12/04/20 0549 12/05/20 0416  NA 137 135  K 4.4 4.0  CL 101 99  CO2 27 27  GLUCOSE 243* 305*  BUN 14 20  CREATININE 0.73 1.14*  CALCIUM 9.0 9.1   GFR: CrCl cannot be calculated (Unknown ideal weight.). Liver Function Tests: Recent Labs  Lab 12/04/20 0549  AST 32  ALT 19  ALKPHOS 130*  BILITOT 0.8  PROT 6.9  ALBUMIN 3.8   No results for input(s): LIPASE, AMYLASE in the last 168 hours. No results for input(s): AMMONIA in the last 168 hours. Coagulation Profile: No results for input(s): INR, PROTIME in the last 168 hours. Cardiac Enzymes: No results for input(s): CKTOTAL, CKMB, CKMBINDEX, TROPONINI in the last 168 hours. BNP (last 3 results) No results for input(s): PROBNP in the last 8760 hours. HbA1C: Recent Labs    12/04/20 0549  HGBA1C 10.3*   CBG: Recent Labs  Lab 12/04/20 1128 12/04/20 1643 12/04/20 2111  GLUCAP 252* 249* 233*   Lipid Profile: No results for input(s): CHOL, HDL, LDLCALC, TRIG, CHOLHDL, LDLDIRECT in the last 72 hours. Thyroid Function Tests: No results for input(s): TSH, T4TOTAL, FREET4,  T3FREE, THYROIDAB in the last 72 hours. Anemia Panel: No results for input(s): VITAMINB12, FOLATE, FERRITIN, TIBC, IRON, RETICCTPCT in the last 72 hours. Sepsis Labs: No results for input(s): PROCALCITON, LATICACIDVEN in the last 168 hours.  Recent Results (from the past 240 hour(s))  Resp Panel by RT-PCR (Flu A&B, Covid) Nasopharyngeal Swab     Status: None   Collection Time: 12/04/20  8:13 AM   Specimen: Nasopharyngeal Swab; Nasopharyngeal(NP) swabs in vial transport medium  Result Value Ref Range Status   SARS Coronavirus 2 by RT PCR NEGATIVE NEGATIVE Final    Comment: (NOTE) SARS-CoV-2 target nucleic acids are NOT DETECTED.  The SARS-CoV-2 RNA is generally detectable in upper respiratory specimens during the acute phase of infection. The lowest concentration of SARS-CoV-2 viral copies this assay can detect is 138 copies/mL. A negative result does not preclude SARS-Cov-2 infection and should not be used as the sole basis for treatment or other patient management decisions.  A negative result may occur with  improper specimen collection/handling, submission of specimen other than nasopharyngeal swab, presence of viral mutation(s) within the areas targeted by this assay, and inadequate number of viral copies(<138 copies/mL). A negative result must be combined with clinical observations, patient history, and epidemiological information. The expected result is Negative.  Fact Sheet for Patients:  EntrepreneurPulse.com.au  Fact Sheet for Healthcare Providers:  IncredibleEmployment.be  This test is no t yet approved or cleared by the [place] FDA and  has been authorized for detection and/or diagnosis of SARS-CoV-2 by FDA under an Emergency Use Authorization (EUA). This EUA will remain  in effect (meaning this test can be used) for the duration of the COVID-19 declaration under Section 564(b)(1) of the Act, 21 U.S.C.section 360bbb-3(b)(1),  unless the authorization is terminated  or revoked sooner.       Influenza A by PCR NEGATIVE NEGATIVE Final   Influenza B by PCR NEGATIVE NEGATIVE Final    Comment: (NOTE) The Xpert Xpress SARS-CoV-2/FLU/RSV plus assay is intended as an aid in the diagnosis of influenza from Nasopharyngeal swab specimens and should not be used as a sole basis for treatment. Nasal washings and aspirates are unacceptable for Xpert Xpress SARS-CoV-2/FLU/RSV testing.  Fact Sheet for Patients: EntrepreneurPulse.com.au  Fact Sheet for Healthcare Providers: IncredibleEmployment.be  This test is not yet approved or cleared by the [place] FDA and has been authorized for detection and/or diagnosis of SARS-CoV-2 by FDA under an Emergency Use Authorization (EUA). This EUA will remain in effect (meaning this test can be used) for the duration of the COVID-19 declaration under Section 564(b)(1) of the Act, 21 U.S.C. section 360bbb-3(b)(1), unless the authorization is terminated or revoked.  Performed at [facility], [place] [address], [place], [place] [address]          Radiology Studies: DG Chest 1 View  Result Date: 12/04/2020 CLINICAL DATA:  Fall EXAM: CHEST  1 VIEW COMPARISON:  05/25/2014 FINDINGS: Prominent heart size without heart failure. Lungs well aerated and clear. No infiltrate or effusion. Atherosclerotic aorta. ACDF cervical spine IMPRESSION: No active disease. Electronically Signed   By: Franchot Gallo M.D.   On: 12/04/2020 07:58   DG Lumbar Spine Complete  Result Date: 12/04/2020 CLINICAL DATA:  Fall EXAM: LUMBAR SPINE - COMPLETE 4+ VIEW COMPARISON:  None. FINDINGS: Negative for lumbar fracture Normal alignment. Mild scoliosis. Mild to moderate disc degeneration at L3-4. Atherosclerotic aorta. IMPRESSION: Negative for fracture. Electronically Signed   By: Franchot Gallo M.D.   On: 12/04/2020 07:56   DG Pelvis 1-2 Views  Result Date:  12/04/2020 CLINICAL DATA:  Fall EXAM: PELVIS - 1-2 VIEW COMPARISON:  None. FINDINGS: Subcapital fracture left femur with impaction. No significant displacement. Left hip joint normal. No other pelvic fracture identified. IMPRESSION: Subcapital fracture left femur Electronically Signed   By: Franchot Gallo M.D.   On: 12/04/2020 07:55   DG Tibia/Fibula Left  Result Date: 12/04/2020 CLINICAL DATA:  Fall EXAM: LEFT TIBIA AND FIBULA - 2 VIEW COMPARISON:  None. FINDINGS: There is no evidence of fracture or other focal bone lesions. Soft tissues are unremarkable. IMPRESSION: Negative. Electronically Signed   By: Franchot Gallo M.D.   On: 12/04/2020 07:55   DG Knee Complete 4 Views Right  Result Date: 12/04/2020 CLINICAL DATA:  Fall EXAM: RIGHT KNEE - COMPLETE 4+ VIEW COMPARISON:  None. FINDINGS: No evidence of fracture, dislocation, or joint effusion. No evidence of arthropathy or other focal bone abnormality. Soft tissues are  unremarkable. IMPRESSION: Negative. Electronically Signed   By: Franchot Gallo M.D.   On: 12/04/2020 07:57   DG Foot Complete Right  Result Date: 12/04/2020 CLINICAL DATA:  Fall EXAM: RIGHT FOOT COMPLETE - 3+ VIEW COMPARISON:  None. FINDINGS: Negative for foot fracture Hallux valgus deformity.  Otherwise no arthropathy. IMPRESSION: Negative for fracture. Electronically Signed   By: Franchot Gallo M.D.   On: 12/04/2020 07:57   DG Femur Min 2 Views Left  Result Date: 12/04/2020 CLINICAL DATA:  Fall EXAM: LEFT FEMUR 2 VIEWS COMPARISON:  None. FINDINGS: Subcapital fracture left femur with mild impaction. Left hip joint normal. No other femur fracture. IMPRESSION: Subcapital fracture left femur Electronically Signed   By: Franchot Gallo M.D.   On: 12/04/2020 07:54        Scheduled Meds: . amLODipine  5 mg Oral Daily  . atorvastatin  20 mg Oral QHS  . enoxaparin (LOVENOX) injection  40 mg Subcutaneous Q24H  . insulin aspart  0-15 Units Subcutaneous TID WC  . irbesartan  300 mg  Oral Daily  . metoprolol succinate  100 mg Oral BID  . pantoprazole  40 mg Oral BID  . pregabalin  50 mg Oral BID  . senna-docusate  1 tablet Oral QHS  . tamsulosin  0.4 mg Oral QHS  . venlafaxine XR  150 mg Oral BID   Continuous Infusions: . methocarbamol (ROBAXIN) IV       LOS: 1 day    Time spent: More than 50% of that time was spent in counseling and/or coordination of care.      Shelly Coss, MD [facility] P4/26/2022, 8:39 AM

## 2020-12-05 NOTE — Op Note (Signed)
OPERATIVE REPORT  SURGEON: Rod Can, MD   ASSISTANT: Cherlynn June, PA-C  PREOPERATIVE DIAGNOSIS: Displaced Left femoral neck fracture.   POSTOPERATIVE DIAGNOSIS: Displaced Left femoral neck fracture.   PROCEDURE: Left total hip arthroplasty, anterior approach.   IMPLANTS: DePuy Tri Lock stem, size 7, hi offset. DePuy Pinnacle Cup, size 50 mm. DePuy Altrx liner, size 32 by 50 mm, neutral. DePuy Biolox ceramic head ball, size 32 + 1 mm.  ANESTHESIA:  General  ANTIBIOTICS: 2g ancef.  ESTIMATED BLOOD LOSS:-350 mL    DRAINS: None.  COMPLICATIONS: None   CONDITION: PACU - hemodynamically stable.   BRIEF CLINICAL NOTE: Alexis [name] is a 70 y.o. female with a displaced Left femoral neck fracture. The patient was admitted to the hospitalist service and underwent perioperative risk stratification and medical optimization. The risks, benefits, and alternatives to total hip arthroplasty were explained, and the patient elected to proceed.  PROCEDURE IN DETAIL: The patient was taken to the operating room and general anesthesia was induced on the hospital bed.  The patient was then positioned on the Hana table.  All bony prominences were well padded.  The hip was prepped and draped in the normal sterile surgical fashion.  A time-out was called verifying side and site of surgery. Antibiotics were given within 60 minutes of beginning the procedure.  The direct anterior approach to the hip was performed through the Hueter interval.  Lateral femoral circumflex vessels were treated with the Auqumantys. The anterior capsule was exposed and an inverted T capsulotomy was made.  Fracture hematoma was encountered and evacuated. The patient was found to have a comminuted Left subcapital femoral neck fracture.  I freshened the femoral neck cut with a saw.  I removed the femoral neck fragment.  A corkscrew was placed into the head and the head was removed.  This was passed to the back table  and was measured.   Acetabular exposure was achieved, and the pulvinar and labrum were excised. Sequential reaming of the acetabulum was then performed up to a size 49 mm reamer. A 50 mm cup was then opened and impacted into place at approximately 40 degrees of abduction and 20 degrees of anteversion. The final polyethylene liner was impacted into place.    I then gained femoral exposure taking care to protect the abductors and greater trochanter.  This was performed using standard external rotation, extension, and adduction.  The capsule was peeled off the inner aspect of the greater trochanter, taking care to preserve the short external rotators. A cookie cutter was used to enter the femoral canal, and then the femoral canal finder was used to confirm location.  I then sequentially broached up to a size 7.  Calcar planer was used on the femoral neck remnant.  I paced a hi neck and a trial head ball. The hip was reduced.  Leg lengths were checked fluoroscopically.  The hip was dislocated and trial components were removed.  I placed the real stem followed by the real spacer and head ball.  The hip was reduced.  Fluoroscopy was used to confirm component position and leg lengths.  At 90 degrees of external rotation and extension, the hip was stable to an anterior directed force.   The wound was copiously irrigated with Irrisept solution and normal saline using pule lavage.  Marcaine solution was injected into the periarticular soft tissue.  The wound was closed in layers using #1 Vicryl and V-Loc for the fascia, 2-0 Vicryl for the subcutaneous fat,  2-0 Monocryl for the deep dermal layer, and staples + glue for the skin.  Once the glue was fully dried, an Aquacell Ag dressing was applied.  The patient was then awakened from anesthesia and transported to the recovery room in stable condition.  Sponge, needle, and instrument counts were correct at the end of the case x2.  The patient tolerated the procedure well  and there were no known complications.  Please note that a surgical assistant was a medical necessity for this procedure to perform it in a safe and expeditious manner. Assistant was necessary to provide appropriate retraction of vital neurovascular structures, to prevent femoral fracture, and to allow for anatomic placement of the prosthesis.

## 2020-12-05 NOTE — Anesthesia Preprocedure Evaluation (Addendum)
Anesthesia Evaluation  Patient identified by MRN, date of birth, ID band Patient awake    Reviewed: Allergy & Precautions, NPO status , Patient's Chart, lab work & pertinent test results  Airway Mallampati: II  TM Distance: >3 FB Neck ROM: Full    Dental  (+) Teeth Intact, Dental Advisory Given   Pulmonary    breath sounds clear to auscultation       Cardiovascular hypertension,  Rhythm:Regular Rate:Normal     Neuro/Psych    GI/Hepatic   Endo/Other  diabetes  Renal/GU      Musculoskeletal   Abdominal (+) + obese,   Peds  Hematology   Anesthesia Other Findings   Reproductive/Obstetrics                             Anesthesia Physical Anesthesia Plan  ASA: III  Anesthesia Plan: General   Post-op Pain Management:    Induction: Intravenous  PONV Risk Score and Plan: Ondansetron and Metaclopromide  Airway Management Planned: Oral ETT  Additional Equipment:   Intra-op Plan:   Post-operative Plan: Extubation in OR  Informed Consent: I have reviewed the patients History and Physical, chart, labs and discussed the procedure including the risks, benefits and alternatives for the proposed anesthesia with the patient or authorized representative who has indicated his/her understanding and acceptance.     Dental advisory given  Plan Discussed with: CRNA and Anesthesiologist  Anesthesia Plan Comments:         Anesthesia Quick Evaluation

## 2020-12-05 NOTE — Anesthesia Procedure Notes (Signed)
Procedure Name: Intubation Date/Time: 12/05/2020 1:03 PM Performed by: Candis Shine, CRNA Pre-anesthesia Checklist: Patient identified, Emergency Drugs available, Suction available and Patient being monitored Patient Re-evaluated:Patient Re-evaluated prior to induction Oxygen Delivery Method: Circle System Utilized Preoxygenation: Pre-oxygenation with 100% oxygen Induction Type: IV induction Ventilation: Mask ventilation without difficulty Laryngoscope Size: Mac and 3 Grade View: Grade II Tube type: Oral Tube size: 7.0 mm Number of attempts: 1 Airway Equipment and Method: Stylet Placement Confirmation: ETT inserted through vocal cords under direct vision,  positive ETCO2 and breath sounds checked- equal and bilateral Secured at: 22 cm Tube secured with: Tape Dental Injury: Teeth and Oropharynx as per pre-operative assessment

## 2020-12-05 NOTE — Progress Notes (Addendum)
[place] for Restarting Plavix on POD 1 Indication:  CAD  Allergies  Allergen Reactions  . Aspirin Other (See Comments)    Reaction: Vasculitis per MD  . Codeine Itching and Rash  . Naproxen Nausea Only    Reaction: Makes stomach upset/irritated.  . Sulfonamide Derivatives Itching and Rash     Vital Signs: Temp: 98.2 F (36.8 C) (04/26 0700) Temp Source: Oral (04/26 0700) BP: 145/65 (04/26 0700) Pulse Rate: 81 (04/26 0700)  Labs: Recent Labs    12/04/20 0549 12/05/20 0416  HGB 12.5 12.9  HCT 40.4 41.1  PLT 302 283  CREATININE 0.73 1.14*    CrCl cannot be calculated (Unknown ideal weight.).   Medical History: Past Medical History:  Diagnosis Date  . Anxiety   . Arthritis    "knees" (05/25/2014)  . Bursitis of knee    "both"  . Chronic back pain   . Depression   . Diabetes mellitus type II   . Diabetic peripheral neuropathy ([place]) 10/20/2018  . Diverticulitis   . Fatty liver   . Fibromyalgia   . Gastric polyp    hyperplastic  . Gastroparesis    "recently dx'd" (05/25/2014)  . GERD (gastroesophageal reflux disease)   . H/O hiatal hernia   . XIPJASNK(539.7)    "monthly" (05/25/2014)  . Heart attack ([place]) 2014   "mild"  . Hx of gastritis   . Hyperlipidemia   . Hypertension   . Irritable bowel syndrome (IBS)   . Memory difficulties 09/18/2017  . Migraine without aura, without mention of intractable migraine without mention of status migrainosus    "related to allergies; have them in the spring and fall" (05/25/2014)  . Obesity   . Pancreatic cyst    peripancreatic cystic lesion  . Renal cyst   . Sleep apnea    "wore mask; took it off in my sleep; quit wearing it" (05/25/2014)  . Sprain of neck 03/18/2013  . Uterine cancer ([place]) dx'd 2000   surg only  . Vasculitis ([place])    "irritates my legs"  . Wears glasses     Assessment: 70 yr old female admitted on 12/04/20 with displaced femoral neck fracture. Pt is S/P L  total hip arthroplasty this afternoon. Pt was on Plavix 75 mg PO daily PTA (last dose on 12/03/20). Pharmacy was consulted to resume Plavix on the AM of POD 1, no bridging necessary.  H/H 12.9/41.1, pt 283 (CBC WNL and stable)  Plan:  Restart Plavix 75 mg PO daily on POD 1 (tomorrow, 12/06/20) Monitor CBC Monitor for bleeding  Gillermina Hu, PharmD, BCPS, [facility] Clinical Pharmacist 12/05/2020,3:00 PM

## 2020-12-05 NOTE — Anesthesia Postprocedure Evaluation (Signed)
Anesthesia Post Note  Patient: Alexis [name]  Procedure(s) Performed: TOTAL HIP ARTHROPLASTY ANTERIOR APPROACH (Left Hip)     Patient location during evaluation: PACU Anesthesia Type: General Level of consciousness: awake and alert Pain management: pain level controlled Vital Signs Assessment: post-procedure vital signs reviewed and stable Respiratory status: spontaneous breathing, nonlabored ventilation, respiratory function stable and patient connected to nasal cannula oxygen Cardiovascular status: stable and blood pressure returned to baseline Postop Assessment: no apparent nausea or vomiting Anesthetic complications: no   No complications documented.  Last Vitals:  Vitals:   12/05/20 1620 12/05/20 1635  BP: (!) 99/41 (!) 106/44  Pulse: 73 72  Resp: (!) 21 18  Temp:    SpO2: 90% 95%    Last Pain:  Vitals:   12/05/20 1620  TempSrc:   PainSc: Asleep                 Viviann Broyles COKER

## 2020-12-05 NOTE — Transfer of Care (Signed)
Immediate Anesthesia Transfer of Care Note  Patient: Alexis [name]  Procedure(s) Performed: TOTAL HIP ARTHROPLASTY ANTERIOR APPROACH (Left Hip)  Patient Location: PACU  Anesthesia Type:General  Level of Consciousness: drowsy  Airway & Oxygen Therapy: Patient Spontanous Breathing and Patient connected to face mask oxygen  Post-op Assessment: Report given to RN and Post -op Vital signs reviewed and stable  Post vital signs: Reviewed and stable  Last Vitals:  Vitals Value Taken Time  BP 116/50 12/05/20 1518  Temp    Pulse 71 12/05/20 1519  Resp 22 12/05/20 1519  SpO2 96 % 12/05/20 1519  Vitals shown include unvalidated device data.  Last Pain:  Vitals:   12/05/20 0700  TempSrc: Oral  PainSc:       Patients Stated Pain Goal: 2 (38/25/05 3976)  Complications: No complications documented.

## 2020-12-05 NOTE — Progress Notes (Signed)
Inpatient Diabetes Program Recommendations  AACE/ADA: New Consensus Statement on Inpatient Glycemic Control (2015)  Target Ranges:  Prepandial:   less than 140 mg/dL      Peak postprandial:   less than 180 mg/dL (1-2 hours)      Critically ill patients:  140 - 180 mg/dL   Results for Alexis [name], Alexis [name] (MRN [id number]) as of 12/05/2020 10:21  Ref. Range 12/04/2020 11:28 12/04/2020 16:43 12/04/2020 21:11  Glucose-Capillary Latest Ref Range: 70 - 99 mg/dL 252 (H)  8 units NOVOLOG  249 (H)  5 units NOVOLOG  233 (H)   Results for Alexis [name], Alexis [name] (MRN [id number]) as of 12/05/2020 12:56  Ref. Range 12/05/2020 10:51 12/05/2020 12:04  Glucose-Capillary Latest Ref Range: 70 - 99 mg/dL 281 (H)  8 units NOVOLOG  261 (H)   Results for Alexis [name], Alexis [name] (MRN [id number]) as of 12/05/2020 10:21  Ref. Range 12/04/2020 05:49  Hemoglobin A1C Latest Ref Range: 4.8 - 5.6 % 10.3 (H)  (248 mg/dl)    Admit with: Left subcapital femur fracture secondary to fall  History: DM  Home DM Meds: Farxiga 10 mg Daily       Metformin 500 mg Daily       Tresiba 25 units Daily       Trulicity 3 mg Qweek  Current Orders: Novolog Moderate Correction Scale/ SSI (0-15 units) TID AC     MD- Please consider starting Lantus 20 units QHS tonight (80% total home dose)    --Will follow patient during hospitalization--  Wyn Quaker RN, MSN, CDE Diabetes Coordinator Inpatient Glycemic Control Team Team Pager: [phone] (8a-5p)

## 2020-12-06 ENCOUNTER — Inpatient Hospital Stay (HOSPITAL_COMMUNITY): Payer: Medicare Other

## 2020-12-06 ENCOUNTER — Encounter (HOSPITAL_COMMUNITY): Payer: Self-pay | Admitting: Orthopedic Surgery

## 2020-12-06 DIAGNOSIS — I9589 Other hypotension: Secondary | ICD-10-CM | POA: Diagnosis not present

## 2020-12-06 DIAGNOSIS — G9341 Metabolic encephalopathy: Secondary | ICD-10-CM | POA: Diagnosis not present

## 2020-12-06 DIAGNOSIS — E861 Hypovolemia: Secondary | ICD-10-CM

## 2020-12-06 DIAGNOSIS — E785 Hyperlipidemia, unspecified: Secondary | ICD-10-CM | POA: Diagnosis not present

## 2020-12-06 DIAGNOSIS — I2510 Atherosclerotic heart disease of native coronary artery without angina pectoris: Secondary | ICD-10-CM | POA: Diagnosis not present

## 2020-12-06 DIAGNOSIS — W19XXXA Unspecified fall, initial encounter: Secondary | ICD-10-CM | POA: Diagnosis not present

## 2020-12-06 DIAGNOSIS — S72012A Unspecified intracapsular fracture of left femur, initial encounter for closed fracture: Secondary | ICD-10-CM | POA: Diagnosis not present

## 2020-12-06 LAB — BASIC METABOLIC PANEL
Anion gap: 10 (ref 5–15)
BUN: 36 mg/dL — ABNORMAL HIGH (ref 8–23)
CO2: 26 mmol/L (ref 22–32)
Calcium: 8 mg/dL — ABNORMAL LOW (ref 8.9–10.3)
Chloride: 94 mmol/L — ABNORMAL LOW (ref 98–111)
Creatinine, Ser: 1.93 mg/dL — ABNORMAL HIGH (ref 0.44–1.00)
GFR, Estimated: 28 mL/min — ABNORMAL LOW (ref >60)
Glucose, Bld: 404 mg/dL — ABNORMAL HIGH (ref 70–99)
Potassium: 4.5 mmol/L (ref 3.5–5.1)
Sodium: 130 mmol/L — ABNORMAL LOW (ref 135–145)

## 2020-12-06 LAB — PREPARE RBC (CROSSMATCH)

## 2020-12-06 LAB — COMPREHENSIVE METABOLIC PANEL
ALT: 9 U/L (ref 0–44)
AST: 29 U/L (ref 15–41)
Albumin: 2.1 g/dL — ABNORMAL LOW (ref 3.5–5.0)
Alkaline Phosphatase: 57 U/L (ref 38–126)
Anion gap: 6 (ref 5–15)
BUN: 47 mg/dL — ABNORMAL HIGH (ref 8–23)
CO2: 23 mmol/L (ref 22–32)
Calcium: 7.5 mg/dL — ABNORMAL LOW (ref 8.9–10.3)
Chloride: 99 mmol/L (ref 98–111)
Creatinine, Ser: 1.5 mg/dL — ABNORMAL HIGH (ref 0.44–1.00)
GFR, Estimated: 37 mL/min — ABNORMAL LOW (ref >60)
Glucose, Bld: 341 mg/dL — ABNORMAL HIGH (ref 70–99)
Potassium: 3.6 mmol/L (ref 3.5–5.1)
Sodium: 128 mmol/L — ABNORMAL LOW (ref 135–145)
Total Bilirubin: 0.6 mg/dL (ref 0.3–1.2)
Total Protein: 4.5 g/dL — ABNORMAL LOW (ref 6.5–8.1)

## 2020-12-06 LAB — CBC WITH DIFFERENTIAL/PLATELET
Abs Immature Granulocytes: 0.04 10*3/uL (ref 0.00–0.07)
Abs Immature Granulocytes: 0.03 10*3/uL (ref 0.00–0.07)
Basophils Absolute: 0 10*3/uL (ref 0.0–0.1)
Basophils Absolute: 0 10*3/uL (ref 0.0–0.1)
Basophils Relative: 0 %
Basophils Relative: 0 %
Eosinophils Absolute: 0.1 10*3/uL (ref 0.0–0.5)
Eosinophils Absolute: 0 10*3/uL (ref 0.0–0.5)
Eosinophils Relative: 0 %
Eosinophils Relative: 0 %
HCT: 25.9 % — ABNORMAL LOW (ref 36.0–46.0)
HCT: 20.1 % — ABNORMAL LOW (ref 36.0–46.0)
Hemoglobin: 8.1 g/dL — ABNORMAL LOW (ref 12.0–15.0)
Hemoglobin: 6.4 g/dL — CL (ref 12.0–15.0)
Immature Granulocytes: 0 %
Immature Granulocytes: 0 %
Lymphocytes Relative: 9 %
Lymphocytes Relative: 12 %
Lymphs Abs: 1.1 10*3/uL (ref 0.7–4.0)
Lymphs Abs: 1.2 10*3/uL (ref 0.7–4.0)
MCH: 26.6 pg (ref 26.0–34.0)
MCH: 26.2 pg (ref 26.0–34.0)
MCHC: 31.3 g/dL (ref 30.0–36.0)
MCHC: 31.8 g/dL (ref 30.0–36.0)
MCV: 85.2 fL (ref 80.0–100.0)
MCV: 82.4 fL (ref 80.0–100.0)
Monocytes Absolute: 1.3 10*3/uL — ABNORMAL HIGH (ref 0.1–1.0)
Monocytes Absolute: 1.2 10*3/uL — ABNORMAL HIGH (ref 0.1–1.0)
Monocytes Relative: 10 %
Monocytes Relative: 13 %
Neutro Abs: 10.5 10*3/uL — ABNORMAL HIGH (ref 1.7–7.7)
Neutro Abs: 7.1 10*3/uL (ref 1.7–7.7)
Neutrophils Relative %: 81 %
Neutrophils Relative %: 75 %
Platelets: 185 10*3/uL (ref 150–400)
Platelets: 142 10*3/uL — ABNORMAL LOW (ref 150–400)
RBC: 3.04 MIL/uL — ABNORMAL LOW (ref 3.87–5.11)
RBC: 2.44 MIL/uL — ABNORMAL LOW (ref 3.87–5.11)
RDW: 15.3 % (ref 11.5–15.5)
RDW: 15.2 % (ref 11.5–15.5)
WBC: 13 10*3/uL — ABNORMAL HIGH (ref 4.0–10.5)
WBC: 9.5 10*3/uL (ref 4.0–10.5)
nRBC: 0 % (ref 0.0–0.2)
nRBC: 0 % (ref 0.0–0.2)

## 2020-12-06 LAB — CBC
HCT: 22.3 % — ABNORMAL LOW (ref 36.0–46.0)
Hemoglobin: 7 g/dL — ABNORMAL LOW (ref 12.0–15.0)
MCH: 26.2 pg (ref 26.0–34.0)
MCHC: 31.4 g/dL (ref 30.0–36.0)
MCV: 83.5 fL (ref 80.0–100.0)
Platelets: 154 10*3/uL (ref 150–400)
RBC: 2.67 MIL/uL — ABNORMAL LOW (ref 3.87–5.11)
RDW: 15.3 % (ref 11.5–15.5)
WBC: 9.3 10*3/uL (ref 4.0–10.5)
nRBC: 0 % (ref 0.0–0.2)

## 2020-12-06 LAB — BLOOD GAS, VENOUS
Acid-base deficit: 1.1 mmol/L (ref 0.0–2.0)
Bicarbonate: 23.8 mmol/L (ref 20.0–28.0)
Drawn by: 5979
FIO2: 28
O2 Saturation: 76.6 %
Patient temperature: 37
pCO2, Ven: 45.2 mmHg (ref 44.0–60.0)
pH, Ven: 7.341 (ref 7.250–7.430)
pO2, Ven: 47.2 mmHg — ABNORMAL HIGH (ref 32.0–45.0)

## 2020-12-06 LAB — GLUCOSE, CAPILLARY
Glucose-Capillary: 400 mg/dL — ABNORMAL HIGH (ref 70–99)
Glucose-Capillary: 375 mg/dL — ABNORMAL HIGH (ref 70–99)
Glucose-Capillary: 352 mg/dL — ABNORMAL HIGH (ref 70–99)
Glucose-Capillary: 318 mg/dL — ABNORMAL HIGH (ref 70–99)

## 2020-12-06 LAB — BRAIN NATRIURETIC PEPTIDE: B Natriuretic Peptide: 220 pg/mL — ABNORMAL HIGH (ref 0.0–100.0)

## 2020-12-06 LAB — LACTIC ACID, PLASMA: Lactic Acid, Venous: 1.4 mmol/L (ref 0.5–1.9)

## 2020-12-06 MED ORDER — SODIUM CHLORIDE 0.9 % IV BOLUS
500.0000 mL | Freq: Once | INTRAVENOUS | Status: AC
  Administered 2020-12-06: 500 mL via INTRAVENOUS

## 2020-12-06 MED ORDER — SODIUM CHLORIDE 0.9% IV SOLUTION: Freq: Once | INTRAVENOUS | Status: AC

## 2020-12-06 MED ORDER — MIDODRINE HCL 5 MG PO TABS
5.0000 mg | ORAL_TABLET | Freq: Three times a day (TID) | ORAL | Status: DC
  Administered 2020-12-06: 5 mg via ORAL
  Filled 2020-12-06: qty 1

## 2020-12-06 MED ORDER — INSULIN GLARGINE 100 UNIT/ML ~~LOC~~ SOLN
25.0000 [IU] | Freq: Every day | SUBCUTANEOUS | Status: DC
  Administered 2020-12-06: 25 [IU] via SUBCUTANEOUS
  Filled 2020-12-06 (×2): qty 0.25

## 2020-12-06 MED ORDER — SODIUM CHLORIDE 0.9 % IV SOLN: INTRAVENOUS | Status: DC

## 2020-12-06 MED ORDER — METOPROLOL SUCCINATE ER 25 MG PO TB24: 25.0000 mg | ORAL_TABLET | Freq: Two times a day (BID) | ORAL | Status: DC

## 2020-12-06 MED ORDER — MIDODRINE HCL 5 MG PO TABS
2.5000 mg | ORAL_TABLET | Freq: Three times a day (TID) | ORAL | Status: DC
  Administered 2020-12-07: 2.5 mg via ORAL
  Filled 2020-12-06: qty 1

## 2020-12-06 MED ORDER — ENOXAPARIN SODIUM 30 MG/0.3ML ~~LOC~~ SOLN: 30.0000 mg | SUBCUTANEOUS | Status: DC

## 2020-12-06 MED ORDER — POLYETHYLENE GLYCOL 3350 17 G PO PACK
17.0000 g | PACK | Freq: Every day | ORAL | Status: DC
  Administered 2020-12-06 – 2020-12-08 (×3): 17 g via ORAL
  Filled 2020-12-06 (×3): qty 1

## 2020-12-06 MED ORDER — SODIUM CHLORIDE 0.9 % IV BOLUS
1000.0000 mL | Freq: Once | INTRAVENOUS | Status: AC
  Administered 2020-12-06: 1000 mL via INTRAVENOUS

## 2020-12-06 NOTE — Progress Notes (Signed)
HGB 7. Paged the attending, Dr Kurtis Bushman. Per policy, unable to get telephone order for blood transfusion. BP 158/98 after given midodrine 5 mg. Got telephone order to reduce to 2.5mg   3 times daily.

## 2020-12-06 NOTE — Evaluation (Signed)
Physical Therapy Evaluation Patient Details Name: Alexis [name] MRN: [id number] DOB: 22-Mar-1951 Today's Date: 12/06/2020   History of Present Illness  Pt is a 70 yo female who fell and sustained a L hip fracture who then underwent a L THA, anterior approach. PMH: depression, DM II, anxiety PSH: ACDF 04/2018    Clinical Impression  Pt admitted with above. Pt extremely lethargic with difficulty staying awake to actively participate in therapy. Difficulty to engage pt in exercises, noted L hip edema which was tender to touch, and requiring increased assist with transfers. Pts BP soft but asymptomatic in regard to dizziness. Pt kept eyes closed the entire time with exception of when PT called her name to stay awake however pt unable to maintain. Pt did tolerate standing on L LE well today and was able to transfer to chair. Per daughter pt is typically more awake and alert at home despite sedentary lifestyle and she has had several recent falls. Daughter is concerned it is related to medications she is taking, notified RN of patients concern. Acute PT to cont to follow to progress mobility.  BP with HOB at 56 deg: 98/40 BP in sitting: 105/47 BP in chair s/p std pvt transfer: 87/41 BP s/p 3 min in chair: 93/40 BP s/p 5 min in chair: 83/32    Follow Up Recommendations SNF;Supervision/Assistance - 24 hour    Equipment Recommendations  Rolling walker with 5" wheels    Recommendations for Other Services       Precautions / Restrictions Precautions Precautions: Fall Precaution Comments: pt extremely lethargic Restrictions Weight Bearing Restrictions: No      Mobility  Bed Mobility Overal bed mobility: Needs Assistance Bed Mobility: Supine to Sit     Supine to sit: Mod assist;Max assist;HOB elevated     General bed mobility comments: with max verbal cues pt able to move R LE to EOB, minA for L LE managment, mod assist for trunk elevation and maxA to pivot hips around using bed pad     Transfers Overall transfer level: Needs assistance Equipment used: Rolling walker (2 wheeled) Transfers: Sit to/from [facility] Sit to Stand: Min assist Stand pivot transfers: Min assist       General transfer comment: despite eyes being closed pt able to stand, tolerate weight on L LE well and complete stand pvt transfer to chair with max verbal and tactile directional cues  Ambulation/Gait             General Gait Details: pt not awake enough to ambulate, BP low as well  Stairs            Wheelchair Mobility    Modified Rankin (Stroke Patients Only)       Balance Overall balance assessment: Needs assistance Sitting-balance support: No upper extremity supported;Feet supported Sitting balance-Leahy Scale: Fair     Standing balance support: Bilateral upper extremity supported Standing balance-Leahy Scale: Fair Standing balance comment: used walker for standing                             Pertinent Vitals/Pain Pain Assessment: Faces Faces Pain Scale: Hurts a little bit Pain Location: L hip to touch, didn't grimace with pain with movement    Home Living Family/patient expects to be discharged to:: Skilled nursing facility                 Additional Comments: pt lives at home with spouse, pt very lethargic/sleepy -  history provided by daughter Alexis [name]. 1 story home with 4 steps to enter with HR on the R, pt was indep at baseline without AD however was very sedentary.    Prior Function Level of Independence: Independent         Comments: pt didn't use AD PTA     Hand Dominance   Dominant Hand: Right    Extremity/Trunk Assessment   Upper Extremity Assessment Upper Extremity Assessment: Generalized weakness    Lower Extremity Assessment Lower Extremity Assessment: Generalized weakness (able to complete quad set and glut set, limited ROM to L hip due to pain, able to do LAQ bilat)    Cervical / Trunk  Assessment Cervical / Trunk Assessment: Normal  Communication   Communication: Expressive difficulties (very sleepy)  Cognition Arousal/Alertness: Lethargic (very sleepy) Behavior During Therapy: Flat affect (very sleepy) Overall Cognitive Status: Impaired/Different from baseline Area of Impairment: Orientation;Attention;Memory;Following commands;Safety/judgement;Awareness;Problem solving                 Orientation Level: Disoriented to;Place;Time;Situation (statead she fell and was in hospital but didn't know or recall L hip fracture and surgery) Current Attention Level: Focused (max verbal cues to maintain eyes open) Memory: Decreased short-term memory;Decreased recall of precautions Following Commands: Follows one step commands inconsistently (pt very sleepy) Safety/Judgement: Decreased awareness of safety;Decreased awareness of deficits Awareness: Intellectual Problem Solving: Slow processing;Decreased initiation;Difficulty sequencing;Requires verbal cues;Requires tactile cues General Comments: pt very sleepy, unable to keep eyes open requiring frequent verbal and tactile cues to participate and stay awake      General Comments General comments (skin integrity, edema, etc.): L hip and elbow bruising, edema t/o pelvis, L hip/thigh    Exercises General Exercises - Lower Extremity Ankle Circles/Pumps: AROM;Both;10 reps;Supine Quad Sets: AROM;Both;10 reps;Supine Gluteal Sets: AROM;Both;10 reps;Supine   Assessment/Plan    PT Assessment Patient needs continued PT services  PT Problem List Decreased range of motion;Decreased strength;Decreased activity tolerance;Decreased balance;Decreased mobility;Decreased coordination;Decreased cognition;Decreased knowledge of use of DME;Decreased safety awareness;Decreased knowledge of precautions       PT Treatment Interventions DME instruction;Gait training;Stair training;Functional mobility training;Therapeutic activities;Therapeutic  exercise;Balance training;Neuromuscular re-education    PT Goals (Current goals can be found in the Care Plan section)  Acute Rehab PT Goals Patient Stated Goal: didn't state PT Goal Formulation: With patient/family Time For Goal Achievement: 12/20/20 Potential to Achieve Goals: Good    Frequency Min 3X/week   Barriers to discharge Decreased caregiver support spouse unable to care for patient at max level    Co-evaluation               AM-PAC PT "6 Clicks" Mobility  Outcome Measure Help needed turning from your back to your side while in a flat bed without using bedrails?: A Lot Help needed moving from lying on your back to sitting on the side of a flat bed without using bedrails?: A Lot Help needed moving to and from a bed to a chair (including a wheelchair)?: A Lot Help needed standing up from a chair using your arms (e.g., wheelchair or bedside chair)?: A Little Help needed to walk in hospital room?: A Little Help needed climbing 3-5 steps with a railing? : A Lot 6 Click Score: 14    End of Session Equipment Utilized During Treatment: Gait belt Activity Tolerance: Patient limited by lethargy Patient left: in chair;with call bell/phone within reach;with chair alarm set;with family/visitor present Nurse Communication: Mobility status (severe lethargy, word finding difficulties, increasing confusion) PT Visit Diagnosis: Unsteadiness on feet (  R26.81);Muscle weakness (generalized) (M62.81);Difficulty in walking, not elsewhere classified (R26.2)    Time: 4562-5638 PT Time Calculation (min) (ACUTE ONLY): 44 min   Charges:   PT Evaluation $PT Eval Moderate Complexity: 1 Mod PT Treatments $Therapeutic Exercise: 8-22 mins $Therapeutic Activity: 8-22 mins        Kittie Plater, PT, DPT Acute Rehabilitation Services Pager #: [phone] Office #: [phone]   Berline Lopes 12/06/2020, 1:51 PM

## 2020-12-06 NOTE — Progress Notes (Addendum)
Inpatient Diabetes Program Recommendations  AACE/ADA: New Consensus Statement on Inpatient Glycemic Control (2015)  Target Ranges:  Prepandial:   less than 140 mg/dL      Peak postprandial:   less than 180 mg/dL (1-2 hours)      Critically ill patients:  140 - 180 mg/dL   Results for Alexis [name], RETTKE (MRN [id number]) as of 12/06/2020 07:16  Ref. Range 12/05/2020 10:51 12/05/2020 12:04 12/05/2020 14:28 12/05/2020 15:21 12/05/2020 20:04  Glucose-Capillary Latest Ref Range: 70 - 99 mg/dL 281 (H)  8 units NOVOLOG  261 (H)  4 units NOVOLOG '@1' :38pm 252 (H) 222 (H) 299 (H)   Results for Alexis, [name] (MRN [id number]) as of 12/06/2020 07:16  Ref. Range 12/06/2020 06:46  Glucose-Capillary Latest Ref Range: 70 - 99 mg/dL 400 (H)    Results for Alexis, [name] (MRN [id number]) as of 12/05/2020 10:21  Ref. Range 12/04/2020 05:49  Hemoglobin A1C Latest Ref Range: 4.8 - 5.6 % 10.3 (H)  (248 mg/dl)    Admit with: Left subcapital femur fracture secondary to fall  History: DM  Home DM Meds: Farxiga 10 mg Daily                             Metformin 500 mg Daily                             Tresiba 25 units Daily                             Trulicity 3 mg Qweek  Current Orders: Novolog Moderate Correction Scale/ SSI (0-15 units) TID AC     MD- Note CBG 400 this AM.  Pt takes [place] insulin 25 units Daily at home.  Please consider starting Lantus 25 units Daily this AM    Addendum 10:40am--Met w/ pt at bedside.  Daughter Larene Beach and Husband at bedside.  Attempted to talk with pt about her Current A1c of 10.3% along with her diabetes care regimen at home, however, pt was appeared confused and was unable to hold meaningful conversation.  Spoke mostly with Daughter and Husband.  We reviewed her current A1c.  Pt sees [facility] Shelia [facility] with [facility].  Encouraged family to have pt follow up with PCP soon after discharge for further diabetes management.  Daughter told me that  pt usually takes all her meds independently--Family unsure if pt has been consistent with meds--Family did state pt rarely checks her CBGs.  Discussed with family the importance of CBG checks at home and encouraged CBG checks 2 times per day (1st thing in the AM and vary the 2nd check of the day either before another meal or 2 hours after a meal).  Discussed with family the importance of good CBG control post-surgery to help with healing and prevent infection.  Also discussed with family that I have reached out to the Hospital MD and have requested basal insulin for pt in the hospital.  Family appreciative of visit and did not have any questions regarding diabetes at this time.     --Will follow patient during hospitalization--  Wyn Quaker RN, MSN, CDE Diabetes Coordinator Inpatient Glycemic Control Team Team Pager: [phone] (8a-5p)

## 2020-12-06 NOTE — TOC Progression Note (Signed)
Transition of Care [facility]) - Progression Note    Patient Details  Name: Alexis [name] MRN: [id number] Date of Birth: [birth year]  Transition of [facility] CM/SW Contact  Milinda Antis, Kimberly Phone Number: 12/06/2020, 4:35 PM  Clinical Narrative:    CSW called patient's room to attempt to begin d/c planning.  Patient's daughter, Larene Beach, answered and informed CSW that critical care was just called to the patient's room.  CSW unable to complete assessment at this time.  CSW will continue to follow patient for d/c needs.          Expected Discharge Plan and Services                                                 Social Determinants of Health (SDOH) Interventions    Readmission Risk Interventions No flowsheet data found.

## 2020-12-06 NOTE — Progress Notes (Signed)
Alexis [name] PPI:[id number] DOB: 04/13/1951 DOA: 12/04/2020  Chief Complaint: AMS and hypotension.  HPI: Alexis [name] is a 70 y.o. female with below past medical history who was admitted for left subcapital femoral fracture undergoing left THA surgery on 12/05/2020.  The nursing staff reported that the patient has been lethargic and confused today according to her daughter and dayshift staff.  She has been off opioids or any other sedatives.  Review of Systems: Unable to obtain due to current mental status.  Past Medical History:  Diagnosis Date  . Anxiety   . Arthritis    "knees" (05/25/2014)  . Bursitis of knee    "both"  . Chronic back pain   . Depression   . Diabetes mellitus type II   . Diabetic peripheral neuropathy (HCC) 10/20/2018  . Diverticulitis   . Fatty liver   . Fibromyalgia   . Gastric polyp    hyperplastic  . Gastroparesis    "recently dx'd" (05/25/2014)  . GERD (gastroesophageal reflux disease)   . H/O hiatal hernia   . AYTKZSWF(093.2)    "monthly" (05/25/2014)  . Heart attack (HCC) 2014   "mild"  . Hx of gastritis   . Hyperlipidemia   . Hypertension   . Irritable bowel syndrome (IBS)   . Memory difficulties 09/18/2017  . Migraine without aura, without mention of intractable migraine without mention of status migrainosus    "related to allergies; have them in the spring and fall" (05/25/2014)  . Obesity   . Pancreatic cyst    peripancreatic cystic lesion  . Renal cyst   . Sleep apnea    "wore mask; took it off in my sleep; quit wearing it" (05/25/2014)  . Sprain of neck 03/18/2013  . Uterine cancer (HCC) dx'd 2000   surg only  . Vasculitis (HCC)    "irritates my legs"  . Wears glasses     Past Surgical History:  Procedure Laterality Date  . ANTERIOR CERVICAL DECOMPRESSION/DISCECTOMY FUSION 4 LEVELS N/A 05/07/2018   Procedure: ANTERIOR CERVICAL DECOMPRESSION FUSION, CERVICAL 4-5,CERVICAL 5-6, CERVICAL 6-7 WITH INSTRUMENTATION AND  ALLOGRAFT;  Surgeon: Estill Bamberg, MD;  Location: [facility] OR;  Service: Orthopedics;  Laterality: N/A;  . APPENDECTOMY  ~ 1967  . BREAST CYST EXCISION Right 1990  . COLONOSCOPY    . LAPAROSCOPIC CHOLECYSTECTOMY  1990  . LEFT HEART CATHETERIZATION WITH CORONARY ANGIOGRAM N/A 05/26/2014   Procedure: LEFT HEART CATHETERIZATION WITH CORONARY ANGIOGRAM;  Surgeon: Lennette Bihari, MD;  Location: [facility] CATH LAB;  Service: Cardiovascular;  Laterality: N/A;  . SINUS SURGERY WITH INSTATRAK  2000  . TOTAL HIP ARTHROPLASTY Left 12/05/2020   Procedure: TOTAL HIP ARTHROPLASTY ANTERIOR APPROACH;  Surgeon: Samson Frederic, MD;  Location: [facility] OR;  Service: Orthopedics;  Laterality: Left;  . TUBAL LIGATION  ~ 1982  . VAGINAL HYSTERECTOMY  2000    Social History  reports that she has never smoked. She has never used smokeless tobacco. She reports that she does not drink alcohol and does not use drugs.  Allergies  Allergen Reactions  . Aspirin Other (See Comments)    Reaction: Vasculitis per MD  . Codeine Itching and Rash  . Naproxen Nausea Only    Reaction: Makes stomach upset/irritated.  . Sulfonamide Derivatives Itching and Rash    Family History  Problem Relation Age of Onset  . Breast cancer Sister   . Colon polyps Father   . Heart disease Father   . Colon cancer Maternal Uncle   .  Ovarian cancer Maternal Aunt   . Stomach cancer Maternal Aunt   . Diabetes Maternal Aunt   . Heart disease Maternal Uncle   . Heart disease Other        Grandparents  . Irritable bowel syndrome Daughter    Prior to Admission medications   Medication Sig Start Date End Date Taking? Authorizing Provider  acetaminophen (TYLENOL) 500 MG tablet Take 500 mg by mouth every 6 (six) hours as needed for moderate pain or headache.    Yes [provider]  ALPRAZolam Duanne Moron) 0.5 MG tablet Take 0.5-1 mg by mouth at bedtime as needed for anxiety.    Yes [provider]  amLODipine (NORVASC) 5 MG tablet Take 5 mg by  mouth at bedtime. 08/27/19  Yes [provider]  atorvastatin (LIPITOR) 20 MG tablet Take 20 mg by mouth at bedtime.   Yes [provider]  b complex vitamins tablet Take 1 tablet by mouth at bedtime.   Yes [provider]  clopidogrel (PLAVIX) 75 MG tablet TAKE 1 TABLET BY MOUTH EVERY DAY Patient taking differently: Take 75 mg by mouth daily. 12/30/19  Yes Kathrynn Ducking, MD  cyanocobalamin 1000 MCG tablet Take 1,000 mcg by mouth at bedtime.   Yes [provider]  Dulaglutide 3 MG/0.5ML SOPN Inject 3 mg into the skin every Sunday. 10/19/19  Yes [provider]  FARXIGA 10 MG TABS tablet Take 10 mg by mouth daily. 09/10/19  Yes [provider]  fish oil-omega-3 fatty acids 1000 MG capsule Take 1 g by mouth at bedtime.    Yes [provider]  furosemide (LASIX) 20 MG tablet TAKE 1 TABLET (20 MG TOTAL) BY MOUTH AS NEEDED FOR EDEMA. Patient taking differently: Take 20 mg by mouth daily as needed for edema. 08/20/19  Yes Almyra Deforest, PA  metFORMIN (GLUCOPHAGE-XR) 500 MG 24 hr tablet Take 500 mg by mouth daily. 08/24/19  Yes [provider]  metoprolol succinate (TOPROL-XL) 50 MG 24 hr tablet Take 100 mg by mouth 2 (two) times daily. Take with or immediately following a meal.   Yes [provider]  pregabalin (LYRICA) 50 MG capsule Take 50 mg by mouth 2 (two) times daily.   Yes [provider]  RABEprazole (ACIPHEX) 20 MG tablet Take 1 tablet (20 mg total) by mouth 2 (two) times daily. 04/22/12  Yes Lafayette Dragon, MD  rizatriptan (MAXALT) 10 MG tablet Take 10 mg by mouth as needed for migraine. May repeat in 2 hours if needed   Yes [provider]  tamsulosin (FLOMAX) 0.4 MG CAPS capsule Take 0.4 mg by mouth at bedtime.  07/17/16  Yes [provider]  traMADol (ULTRAM) 50 MG tablet Take 100 mg by mouth 2 (two) times daily. 09/13/19  Yes [provider]  TRESIBA FLEXTOUCH 100 UNIT/ML FlexTouch Pen  Inject 25 Units into the skin daily. 09/29/19  Yes [provider]  valsartan (DIOVAN) 320 MG tablet Take 320 mg by mouth daily. 09/10/19  Yes [provider]  venlafaxine XR (EFFEXOR-XR) 150 MG 24 hr capsule Take 150 mg by mouth 2 (two) times daily.   Yes [provider]  zaleplon (SONATA) 5 MG capsule Take 5 mg by mouth at bedtime.   Yes [provider]   Physical Exam: Vitals:   12/07/20 0028 12/07/20 0034 12/07/20 0055 12/07/20 0135  BP: (!) 84/43 (!) 96/47 (!) 103/52 (!) 93/43  Pulse: 89 90 90 91  Resp: (!) 23 (!)  23 (!) 29 (!) 25  Temp:   (!) 101 F (38.3 C) (!) 100.8 F (38.2 C)  TempSrc:    Oral  SpO2: 91%  92% 90%  Weight:       Constitutional: Lethargic, but in NAD. Eyes: PERRL, sclerae, lids and conjunctivae mildly injected ENMT: Mucous membranes are mildly dry. Posterior pharynx clear of any exudate or lesions. Neck: normal, supple, no masses, no thyromegaly Respiratory: Decreased breath sounds on bases due to poor inspiratory effort, otherwise clear to auscultation bilaterally, no wheezing, no crackles. Normal respiratory effort. No accessory muscle use.  Cardiovascular: Regular rate and rhythm, no murmurs / rubs / gallops. No extremity edema. 2+ pedal pulses. No carotid bruits.  Abdomen: Obese, no distention.  Bowel sounds positive.  Soft, no tenderness, no masses palpated. No hepatosplenomegaly. Musculoskeletal: Moderate generalized weakness.  No clubbing / cyanosis. Skin: Left hip surgical area dressing in place. Neurologic: Grossly nonfocal.  Generalized symmetric weakness.  Psychiatric: Somnolent, oriented to name only.  CBC: Recent Labs  Lab 12/04/20 0549 12/05/20 0416 12/06/20 0256 12/06/20 1709 12/06/20 2124  WBC 10.7* 13.6* 13.0* 9.3 9.5  NEUTROABS 7.9*  --  10.5*  --  7.1  HGB 12.5 12.9 8.1* 7.0* 6.4*  HCT 40.4 41.1 25.9* 22.3* 20.1*  MCV 85.2 82.9 85.2 83.5 82.4  PLT 302 283 185 154 142*    Basic Metabolic  Panel: Recent Labs  Lab 12/04/20 0549 12/05/20 0416 12/06/20 0256 12/06/20 2124  NA 137 135 130* 128*  K 4.4 4.0 4.5 3.6  CL 101 99 94* 99  CO2 27 27 26 23   GLUCOSE 243* 305* 404* 341*  BUN 14 20 36* 47*  CREATININE 0.73 1.14* 1.93* 1.50*  CALCIUM 9.0 9.1 8.0* 7.5*    GFR: Estimated Creatinine Clearance: 37.8 mL/min (A) (by C-G formula based on SCr of 1.5 mg/dL (H)).  Liver Function Tests: Recent Labs  Lab 12/04/20 0549 12/06/20 2124  AST 32 29  ALT 19 9  ALKPHOS 130* 57  BILITOT 0.8 0.6  PROT 6.9 4.5*  ALBUMIN 3.8 2.1*   Radiological Exams on Admission: CT HEAD WO CONTRAST  Result Date: 12/06/2020 CLINICAL DATA:  Change in mental status. EXAM: CT HEAD WITHOUT CONTRAST TECHNIQUE: Contiguous axial images were obtained from the base of the skull through the vertex without intravenous contrast. COMPARISON:  None. FINDINGS: Brain: Normal for age atrophy. No intracranial hemorrhage, mass effect, or midline shift. No hydrocephalus. The basilar cisterns are patent. No evidence of territorial infarct or acute ischemia. No extra-axial or intracranial fluid collection. Vascular: Atherosclerosis of skullbase vasculature without hyperdense vessel or abnormal calcification. Skull: No fracture or focal lesion. Sinuses/Orbits: Paranasal sinuses and mastoid air cells are clear. The visualized orbits are unremarkable. Other: None. IMPRESSION: Unremarkable noncontrast head CT for age. Electronically Signed   By: Keith Rake M.D.   On: 12/06/2020 23:28   Pelvis Portable  Result Date: 12/05/2020 CLINICAL DATA:  Postop. EXAM: PORTABLE PELVIS 1-2 VIEWS COMPARISON:  Preoperative radiograph yesterday. FINDINGS: Left hip arthroplasty in expected alignment. No periprosthetic lucency or fracture. Recent postsurgical change includes air and edema in the soft tissues. Lateral skin staples. IMPRESSION: Left hip arthroplasty without immediate postoperative complication. Electronically Signed   By:  Keith Rake M.D.   On: 12/05/2020 16:09   DG CHEST PORT 1 VIEW  Result Date: 12/06/2020 CLINICAL DATA:  Altered mental status EXAM: PORTABLE CHEST 1 VIEW COMPARISON:  December 04, 2020 FINDINGS: Cardiac enlargement with central vascular prominence. Aortic atherosclerosis. Low lung  volumes with bibasilar atelectasis. No overt pulmonary edema. Partially visualized anterior spinal fusion hardware. IMPRESSION: Cardiac enlargement with central vascular prominence. Electronically Signed   By: Dahlia Bailiff MD   On: 12/06/2020 21:48   DG C-Arm 1-60 Min  Result Date: 12/05/2020 CLINICAL DATA:  Elective surgery. Additional history provided: Total left hip arthroplasty with anterior approach. Provided fluoroscopy time: 12 seconds (2.2132 mGy). EXAM: OPERATIVE left HIP (WITH PELVIS IF PERFORMED) 2 VIEWS TECHNIQUE: Fluoroscopic spot image(s) were submitted for interpretation post-operatively. COMPARISON:  Radiographs of the left femur and pelvis 12/04/2020. FINDINGS: Two intraoperative fluoroscopic images of the left hip are submitted. On the provided images, there is evidence of interval left total hip arthroplasty. The femoral and acetabular components appear well seated. No unexpected finding on the provided views. IMPRESSION: Two intraoperative fluoroscopic images from left total hip arthroplasty, as described. Electronically Signed   By: Kellie Simmering DO   On: 12/05/2020 14:55   DG HIP OPERATIVE UNILAT W OR W/O PELVIS LEFT  Result Date: 12/05/2020 CLINICAL DATA:  Elective surgery. Additional history provided: Total left hip arthroplasty with anterior approach. Provided fluoroscopy time: 12 seconds (2.2132 mGy). EXAM: OPERATIVE left HIP (WITH PELVIS IF PERFORMED) 2 VIEWS TECHNIQUE: Fluoroscopic spot image(s) were submitted for interpretation post-operatively. COMPARISON:  Radiographs of the left femur and pelvis 12/04/2020. FINDINGS: Two intraoperative fluoroscopic images of the left hip are submitted. On the  provided images, there is evidence of interval left total hip arthroplasty. The femoral and acetabular components appear well seated. No unexpected finding on the provided views. IMPRESSION: Two intraoperative fluoroscopic images from left total hip arthroplasty, as described. Electronically Signed   By: Kellie Simmering DO   On: 12/05/2020 14:55    EKG: Independently reviewed.  Assessment/Plan Principal Problem:  Hypotension secondary to acute blood loss anemia Normal saline 1000 mL bolus given earlier. Receiving a 2 unit PRBC transfusion after Hb 6.4 g/dL. Follow-up posttransfusion hemoglobin level.    Fever  Was already mildly febrile before transfusion. Chest radiograph was negative. Urinalysis still pending. Given hypotension and AMS, empiric antibiotic started.    AMS due to    Acute metabolic encephalopathy In the setting of hypotension, fever, AKI. CT head with contrast did not show any acute findings. Continue PRBC transfusion. Avoid medications that may induce sedation.  Given all these acute changes, the patient was transferred to the progressive unit for closer monitoring.  Reubin Milan MD [facility]  About 50 minutes of critical care time were spent during the process of this emergent event.  This document was prepared using Dragon voice recognition software and may contain some unintended transcription errors.

## 2020-12-06 NOTE — Progress Notes (Signed)
Patient has been having soft BP( see flowsheet) throughout the day. Discussed concerns with attending in the morning and afternoon. Patient tired, sleepy, pale, BS on upper 300"s, and not voiding. Got orders for a bolus, BP improved at first but it drooped again to 93/58. Discussed case again with the attending. Notified charge nurse and the rapid response RN was notified by Alexis [name], Agricultural consultant. Charge and Rapid response RN evaluated patient. Post-op expected sings and symptoms. Attending ordered midodrine and stat CBC. Lovenox discontinue and placed patient on SCD.  Got order for Lantus to better control BS. Bladder scan patient and showed 252ml, foley catheter placed per order from attending. Daughter at bedside, all concerns addressed. Will continue to monitor.

## 2020-12-06 NOTE — Progress Notes (Signed)
VS through out the day: BP 93-99/33-50  HR 70s-80  RR16  O2 sat 90-96 % on 2L [place].  Afebrile. Per RN has received fluid bolus Voiding via foley catheter placed this afternoon. She is pale but warm to the touch She is alert but confused. Lung sounds decreased bases. Per RN patient has been out of bed to chair with PT today.  MD ordered CBC and midodrine.

## 2020-12-06 NOTE — Progress Notes (Addendum)
PROGRESS NOTE    DE LIBMAN  IHK:[id number] DOB: Dec 11, 1950 DOA: 12/04/2020 PCP: Deland Pretty, MD    Brief Narrative:  Patient is a 70 year old female with history of hypertension, hyperlipidemia, coronary artery disease, diabetes type 2 with peripheral neuropathy, uterine cancer status post hysterectomy, OSA  from home after falling at home.  No history of loss of consciousness or head injury.  After the fall ,she immediately developed pain on the right hip and was unable to bear weight.  On presentation, she was hypertensive, x-ray showed subcapital fracture of the left femur.  Orthopedics consulted and plan is for ORIF  4/27-reports constipation, bp low, sbp 90, held bp meds, losartan, not given beta blk. Given bolus ivf.  Consultants:   orthopedics  Procedures:   Antimicrobials:       Subjective: Reports constipation .     This SmartLink has not been configured with any valid records.    Objective: Vitals:   12/05/20 1740 12/05/20 2113 12/06/20 0603 12/06/20 0745  BP: (!) 107/53 (!) 101/46 (!) 96/48 (!) 93/46  Pulse: 73 73 74 79  Resp: 15 16 16 16   Temp:  98.9 F (37.2 C) 98.3 F (36.8 C) 98.8 F (37.1 C)  TempSrc:  Oral Oral Oral  SpO2: 91% 95% 95% 93%    Intake/Output Summary (Last 24 hours) at 12/06/2020 0836 Last data filed at 12/05/2020 1520 Gross per 24 hour  Intake 1500 ml  Output 350 ml  Net 1150 ml   There were no vitals filed for this visit.  Examination:  General exam: Appears calm and comfortable  Respiratory system: Clear to auscultation. Respiratory effort normal. Cardiovascular system: S1 & S2 heard, RRR. No JVD, murmurs, rubs, gallops or clicks. Gastrointestinal system: Abdomen is nondistended, soft and nontender. Normal bowel sounds heard. Central nervous system: grossly intact Extremities: No edema  skin: Warm dry     Data Reviewed: I have personally reviewed following labs and imaging studies  CBC: Recent Labs  Lab  12/04/20 0549 12/05/20 0416 12/06/20 0256  WBC 10.7* 13.6* 13.0*  NEUTROABS 7.9*  --  10.5*  HGB 12.5 12.9 8.1*  HCT 40.4 41.1 25.9*  MCV 85.2 82.9 85.2  PLT 302 283 756   Basic Metabolic Panel: Recent Labs  Lab 12/04/20 0549 12/05/20 0416 12/06/20 0256  NA 137 135 130*  K 4.4 4.0 4.5  CL 101 99 94*  CO2 27 27 26   GLUCOSE 243* 305* 404*  BUN 14 20 36*  CREATININE 0.73 1.14* 1.93*  CALCIUM 9.0 9.1 8.0*   GFR: CrCl cannot be calculated (Unknown ideal weight.). Liver Function Tests: Recent Labs  Lab 12/04/20 0549  AST 32  ALT 19  ALKPHOS 130*  BILITOT 0.8  PROT 6.9  ALBUMIN 3.8   No results for input(s): LIPASE, AMYLASE in the last 168 hours. No results for input(s): AMMONIA in the last 168 hours. Coagulation Profile: No results for input(s): INR, PROTIME in the last 168 hours. Cardiac Enzymes: No results for input(s): CKTOTAL, CKMB, CKMBINDEX, TROPONINI in the last 168 hours. BNP (last 3 results) No results for input(s): PROBNP in the last 8760 hours. HbA1C: Recent Labs    12/04/20 0549  HGBA1C 10.3*   CBG: Recent Labs  Lab 12/05/20 1204 12/05/20 1428 12/05/20 1521 12/05/20 2004 12/06/20 0646  GLUCAP 261* 252* 222* 299* 400*   Lipid Profile: No results for input(s): CHOL, HDL, LDLCALC, TRIG, CHOLHDL, LDLDIRECT in the last 72 hours. Thyroid Function Tests: No results for input(s): TSH,  T4TOTAL, FREET4, T3FREE, THYROIDAB in the last 72 hours. Anemia Panel: No results for input(s): VITAMINB12, FOLATE, FERRITIN, TIBC, IRON, RETICCTPCT in the last 72 hours. Sepsis Labs: No results for input(s): PROCALCITON, LATICACIDVEN in the last 168 hours.  Recent Results (from the past 240 hour(s))  Resp Panel by RT-PCR (Flu A&B, Covid) Nasopharyngeal Swab     Status: None   Collection Time: 12/04/20  8:13 AM   Specimen: Nasopharyngeal Swab; Nasopharyngeal(NP) swabs in vial transport medium  Result Value Ref Range Status   SARS Coronavirus 2 by RT PCR NEGATIVE  NEGATIVE Final    Comment: (NOTE) SARS-CoV-2 target nucleic acids are NOT DETECTED.  The SARS-CoV-2 RNA is generally detectable in upper respiratory specimens during the acute phase of infection. The lowest concentration of SARS-CoV-2 viral copies this assay can detect is 138 copies/mL. A negative result does not preclude SARS-Cov-2 infection and should not be used as the sole basis for treatment or other patient management decisions. A negative result may occur with  improper specimen collection/handling, submission of specimen other than nasopharyngeal swab, presence of viral mutation(s) within the areas targeted by this assay, and inadequate number of viral copies(<138 copies/mL). A negative result must be combined with clinical observations, patient history, and epidemiological information. The expected result is Negative.  Fact Sheet for Patients:  EntrepreneurPulse.com.au  Fact Sheet for Healthcare Providers:  IncredibleEmployment.be  This test is no t yet approved or cleared by the [place] FDA and  has been authorized for detection and/or diagnosis of SARS-CoV-2 by FDA under an Emergency Use Authorization (EUA). This EUA will remain  in effect (meaning this test can be used) for the duration of the COVID-19 declaration under Section 564(b)(1) of the Act, 21 U.S.C.section 360bbb-3(b)(1), unless the authorization is terminated  or revoked sooner.       Influenza A by PCR NEGATIVE NEGATIVE Final   Influenza B by PCR NEGATIVE NEGATIVE Final    Comment: (NOTE) The Xpert Xpress SARS-CoV-2/FLU/RSV plus assay is intended as an aid in the diagnosis of influenza from Nasopharyngeal swab specimens and should not be used as a sole basis for treatment. Nasal washings and aspirates are unacceptable for Xpert Xpress SARS-CoV-2/FLU/RSV testing.  Fact Sheet for Patients: EntrepreneurPulse.com.au  Fact Sheet for Healthcare  Providers: IncredibleEmployment.be  This test is not yet approved or cleared by the [place] FDA and has been authorized for detection and/or diagnosis of SARS-CoV-2 by FDA under an Emergency Use Authorization (EUA). This EUA will remain in effect (meaning this test can be used) for the duration of the COVID-19 declaration under Section 564(b)(1) of the Act, 21 U.S.C. section 360bbb-3(b)(1), unless the authorization is terminated or revoked.  Performed at [facility], [place] [address], [place], [place] [address]          Radiology Studies: Pelvis Portable  Result Date: 12/05/2020 CLINICAL DATA:  Postop. EXAM: PORTABLE PELVIS 1-2 VIEWS COMPARISON:  Preoperative radiograph yesterday. FINDINGS: Left hip arthroplasty in expected alignment. No periprosthetic lucency or fracture. Recent postsurgical change includes air and edema in the soft tissues. Lateral skin staples. IMPRESSION: Left hip arthroplasty without immediate postoperative complication. Electronically Signed   By: Keith Rake M.D.   On: 12/05/2020 16:09   DG C-Arm 1-60 Min  Result Date: 12/05/2020 CLINICAL DATA:  Elective surgery. Additional history provided: Total left hip arthroplasty with anterior approach. Provided fluoroscopy time: 12 seconds (2.2132 mGy). EXAM: OPERATIVE left HIP (WITH PELVIS IF PERFORMED) 2 VIEWS TECHNIQUE: Fluoroscopic spot image(s) were submitted  for interpretation post-operatively. COMPARISON:  Radiographs of the left femur and pelvis 12/04/2020. FINDINGS: Two intraoperative fluoroscopic images of the left hip are submitted. On the provided images, there is evidence of interval left total hip arthroplasty. The femoral and acetabular components appear well seated. No unexpected finding on the provided views. IMPRESSION: Two intraoperative fluoroscopic images from left total hip arthroplasty, as described. Electronically Signed   By: Kellie Simmering DO   On: 12/05/2020 14:55    DG HIP OPERATIVE UNILAT W OR W/O PELVIS LEFT  Result Date: 12/05/2020 CLINICAL DATA:  Elective surgery. Additional history provided: Total left hip arthroplasty with anterior approach. Provided fluoroscopy time: 12 seconds (2.2132 mGy). EXAM: OPERATIVE left HIP (WITH PELVIS IF PERFORMED) 2 VIEWS TECHNIQUE: Fluoroscopic spot image(s) were submitted for interpretation post-operatively. COMPARISON:  Radiographs of the left femur and pelvis 12/04/2020. FINDINGS: Two intraoperative fluoroscopic images of the left hip are submitted. On the provided images, there is evidence of interval left total hip arthroplasty. The femoral and acetabular components appear well seated. No unexpected finding on the provided views. IMPRESSION: Two intraoperative fluoroscopic images from left total hip arthroplasty, as described. Electronically Signed   By: Kellie Simmering DO   On: 12/05/2020 14:55        Scheduled Meds: . amLODipine  5 mg Oral Daily  . atorvastatin  20 mg Oral QHS  . clopidogrel  75 mg Oral Daily  . docusate sodium  100 mg Oral BID  . insulin aspart  0-15 Units Subcutaneous TID WC  . irbesartan  300 mg Oral Daily  . metoprolol succinate  100 mg Oral BID  . pantoprazole  40 mg Oral BID  . pregabalin  50 mg Oral BID  . tamsulosin  0.4 mg Oral QHS  . venlafaxine XR  150 mg Oral BID   Continuous Infusions: . sodium chloride 75 mL/hr at 12/05/20 1017  . methocarbamol (ROBAXIN) IV      Assessment & Plan:   Principal Problem:   Subcapital fracture of left femur (HCC) Active Problems:   Hyperlipidemia   Anxiety and depression   OSA (obstructive sleep apnea)   Coronary artery calcification seen on computed tomography   Type 2 diabetes mellitus (HCC)   Leukocytosis   Fall at home, initial encounter   Left subcapital femur fracture: Secondary to mechanical fall  Status post left total hip arthroplasty on 4/26 PT recommends SNF Per Ortho Dr. Lyla Glassing okay to start Lovenox for DVT  prophylaxis via chat    Leukocytosis:  Urinary retention- bladder scan 250ml. Not voided. Will place foley  As she is constipated and post op day 1, both can cause urinary retention Will do voiding trial once has BM and ambulating more.    AKI:  Renal function worsening. NO void. Possibly multifactorial as bp low, urinary retention, and BG elevated (prerenal) Hold bp meds, ivf bolus given for bp. Start ivf for 153ml/hr   Diabetes type 2: A1c of 10.3   Takes Farxiga, metformin, Tresiba, dulaglutide at home. Uncontrolled BG Diabetic educator following Will start lantus now 25mg  daily RISS Ivf for hydration with her hyperglycemia  Coronary artery disease: Recently had coronary CTA which showed diffuse nonobstructive coronary artery disease.  On statin and Plavix at home.   Will resume Plavix after surgery.  Hypertension:  Now hypotensive. Hold all bp meds for now Resume bp meds when able to.  Mildly hypertensive on presentation.  Monitor blood pressure.  Continue  Hyperlipidemia: On Lipitor  Anxiety/depression: On venlafaxine, on  Ativan as needed  OSA: Does not use CPAP  GERD: Protonix     DVT prophylaxis: lovenox Code Status:full Family Communication: Daughter at bedside on  Status is: Inpatient  Remains inpatient appropriate because:Inpatient level of care appropriate due to severity of illness   Dispo: The patient is from: Home              Anticipated d/c is to: SNF              Patient currently is not medically stable to d/c.   Difficult to place patient No            LOS: 2 days   Time spent: 45 minutes with more than 50% on [place], MD [facility] Pager [phone]  If 7PM-7AM, please contact night-coverage 12/06/2020, 8:36 AM

## 2020-12-06 NOTE — Progress Notes (Addendum)
Patient brought to room 4N03 via bed. Pleasant but confused. Patient was able to name her daughter but not her last name. Spoke her name and birthdate but could not tell me the year of her birth. Patient noted with very pale skin tone. Was unable to verbalize her PCP's name. Also was able to name her husband of 69years. Patient follows commands. Begin to verbalize answers but begin to ramble on about other things. Set up on telemetry monitor, NSR, 80-90bpm  2353-Yellow MEWS due to elevated temp. Paged MD.   2359- Paged [facility] Olevia Bowens MD for patient's temp 100.3 for premedication orders prior transfusion. Vitals 96/52, 20bpm, 100.67F oral, 90HR. 96% on 02 at 2L via nasal cannula.  New orders noted. 0021- Tylenol 650 given pre transfusion.  Increased 02 from 2L to 3L due to 80s desaturation. Tolerates 3L with sats at 92% and up.  0053- Patient currently resting with eyes closed. Easilly arousable. Denies discomfort. Blood infusing at 6ml/hr at this time. Continue to monitor patient for first 48mins of blood administration.  106- Lab here to draw cultures. Because cultures can not be drawn while blood is infusing, was asked to come back after transfusion complete. Patient still getting 1st transfusion that was started at [facility].  Paged MD Olevia Bowens  to make aware. Maxipime infusing at this time.   Norene.Goldmann- MD Olevia Bowens call back. Cultures discontinued due to the starting of the antibiotics. Will continue to give the other antibiotics per orders.

## 2020-12-07 ENCOUNTER — Inpatient Hospital Stay (HOSPITAL_COMMUNITY): Payer: Medicare Other

## 2020-12-07 DIAGNOSIS — G9341 Metabolic encephalopathy: Secondary | ICD-10-CM | POA: Diagnosis not present

## 2020-12-07 DIAGNOSIS — I480 Paroxysmal atrial fibrillation: Secondary | ICD-10-CM

## 2020-12-07 DIAGNOSIS — D62 Acute posthemorrhagic anemia: Secondary | ICD-10-CM

## 2020-12-07 DIAGNOSIS — F419 Anxiety disorder, unspecified: Secondary | ICD-10-CM | POA: Diagnosis not present

## 2020-12-07 DIAGNOSIS — S72012A Unspecified intracapsular fracture of left femur, initial encounter for closed fracture: Secondary | ICD-10-CM | POA: Diagnosis not present

## 2020-12-07 DIAGNOSIS — I4891 Unspecified atrial fibrillation: Secondary | ICD-10-CM

## 2020-12-07 DIAGNOSIS — I959 Hypotension, unspecified: Secondary | ICD-10-CM | POA: Diagnosis present

## 2020-12-07 LAB — URINALYSIS, ROUTINE W REFLEX MICROSCOPIC
Bacteria, UA: NONE SEEN
Bilirubin Urine: NEGATIVE
Glucose, UA: >=500 mg/dL — AB
Ketones, ur: 5 mg/dL — AB
Nitrite: NEGATIVE
Protein, ur: NEGATIVE mg/dL
Specific Gravity, Urine: 1.02 (ref 1.005–1.030)
pH: 5 (ref 5.0–8.0)

## 2020-12-07 LAB — CBC
HCT: 28.8 % — ABNORMAL LOW (ref 36.0–46.0)
Hemoglobin: 9.5 g/dL — ABNORMAL LOW (ref 12.0–15.0)
MCH: 28 pg (ref 26.0–34.0)
MCHC: 33 g/dL (ref 30.0–36.0)
MCV: 85 fL (ref 80.0–100.0)
Platelets: 131 10*3/uL — ABNORMAL LOW (ref 150–400)
RBC: 3.39 MIL/uL — ABNORMAL LOW (ref 3.87–5.11)
RDW: 16.2 % — ABNORMAL HIGH (ref 11.5–15.5)
WBC: 10.5 10*3/uL (ref 4.0–10.5)
nRBC: 0 % (ref 0.0–0.2)

## 2020-12-07 LAB — ECHOCARDIOGRAM COMPLETE
Area-P 1/2: 3.99 cm2
S' Lateral: 2.8 cm
Weight: 2952.4 oz

## 2020-12-07 LAB — BASIC METABOLIC PANEL
Anion gap: 10 (ref 5–15)
BUN: 41 mg/dL — ABNORMAL HIGH (ref 8–23)
CO2: 22 mmol/L (ref 22–32)
Calcium: 7.9 mg/dL — ABNORMAL LOW (ref 8.9–10.3)
Chloride: 99 mmol/L (ref 98–111)
Creatinine, Ser: 1.22 mg/dL — ABNORMAL HIGH (ref 0.44–1.00)
GFR, Estimated: 48 mL/min — ABNORMAL LOW (ref >60)
Glucose, Bld: 358 mg/dL — ABNORMAL HIGH (ref 70–99)
Potassium: 3.9 mmol/L (ref 3.5–5.1)
Sodium: 131 mmol/L — ABNORMAL LOW (ref 135–145)

## 2020-12-07 LAB — MAGNESIUM: Magnesium: 1.7 mg/dL (ref 1.7–2.4)

## 2020-12-07 LAB — GLUCOSE, CAPILLARY
Glucose-Capillary: 261 mg/dL — ABNORMAL HIGH (ref 70–99)
Glucose-Capillary: 317 mg/dL — ABNORMAL HIGH (ref 70–99)
Glucose-Capillary: 342 mg/dL — ABNORMAL HIGH (ref 70–99)
Glucose-Capillary: 280 mg/dL — ABNORMAL HIGH (ref 70–99)

## 2020-12-07 LAB — TSH: TSH: 1.19 u[IU]/mL (ref 0.350–4.500)

## 2020-12-07 MED ORDER — MIDODRINE HCL 5 MG PO TABS
5.0000 mg | ORAL_TABLET | Freq: Three times a day (TID) | ORAL | Status: DC
  Administered 2020-12-07 (×2): 5 mg via ORAL
  Filled 2020-12-07 (×3): qty 1

## 2020-12-07 MED ORDER — LACTATED RINGERS IV BOLUS (SEPSIS)
250.0000 mL | Freq: Once | INTRAVENOUS | Status: AC
  Administered 2020-12-07: 250 mL via INTRAVENOUS

## 2020-12-07 MED ORDER — SODIUM CHLORIDE 0.9 % IV SOLN
2.0000 g | Freq: Two times a day (BID) | INTRAVENOUS | Status: DC
  Administered 2020-12-07 – 2020-12-08 (×5): 2 g via INTRAVENOUS
  Filled 2020-12-07 (×6): qty 2

## 2020-12-07 MED ORDER — ENSURE ENLIVE PO LIQD
237.0000 mL | Freq: Two times a day (BID) | ORAL | Status: DC
  Filled 2020-12-07 (×2): qty 237

## 2020-12-07 MED ORDER — DIPHENHYDRAMINE HCL 25 MG PO CAPS
25.0000 mg | ORAL_CAPSULE | Freq: Four times a day (QID) | ORAL | Status: DC | PRN
  Administered 2020-12-07: 25 mg via ORAL
  Filled 2020-12-07: qty 1

## 2020-12-07 MED ORDER — VANCOMYCIN HCL 1000 MG/200ML IV SOLN: 1000.0000 mg | Freq: Once | INTRAVENOUS | Status: DC

## 2020-12-07 MED ORDER — INSULIN GLARGINE 100 UNIT/ML ~~LOC~~ SOLN
30.0000 [IU] | Freq: Every day | SUBCUTANEOUS | Status: DC
  Administered 2020-12-08: 30 [IU] via SUBCUTANEOUS
  Filled 2020-12-07: qty 0.3

## 2020-12-07 MED ORDER — SODIUM CHLORIDE 0.9 % IV SOLN: 2.0000 g | Freq: Once | INTRAVENOUS | Status: DC

## 2020-12-07 MED ORDER — HYDROCODONE-ACETAMINOPHEN 5-325 MG PO TABS: 1.0000 | ORAL_TABLET | ORAL | 0 refills | Status: AC | PRN

## 2020-12-07 MED ORDER — CHLORHEXIDINE GLUCONATE CLOTH 2 % EX PADS
6.0000 | MEDICATED_PAD | Freq: Every day | CUTANEOUS | Status: DC
  Administered 2020-12-07 – 2020-12-12 (×5): 6 via TOPICAL

## 2020-12-07 MED ORDER — METOPROLOL TARTRATE 25 MG PO TABS
25.0000 mg | ORAL_TABLET | Freq: Four times a day (QID) | ORAL | Status: DC
  Administered 2020-12-07 – 2020-12-08 (×5): 25 mg via ORAL
  Filled 2020-12-07 (×5): qty 1

## 2020-12-07 MED ORDER — AMIODARONE IV BOLUS ONLY 150 MG/100ML
150.0000 mg | Freq: Once | INTRAVENOUS | Status: AC
  Administered 2020-12-07: 150 mg via INTRAVENOUS
  Filled 2020-12-07: qty 100

## 2020-12-07 MED ORDER — AMIODARONE IV BOLUS ONLY 150 MG/100ML
150.0000 mg | Freq: Once | INTRAVENOUS | Status: DC
  Filled 2020-12-07: qty 100

## 2020-12-07 MED ORDER — VANCOMYCIN HCL 750 MG/150ML IV SOLN
750.0000 mg | INTRAVENOUS | Status: DC
  Administered 2020-12-08 – 2020-12-09 (×2): 750 mg via INTRAVENOUS
  Filled 2020-12-07 (×2): qty 150

## 2020-12-07 MED ORDER — VANCOMYCIN VARIABLE DOSE PER UNSTABLE RENAL FUNCTION (PHARMACIST DOSING): Status: DC

## 2020-12-07 MED ORDER — VANCOMYCIN HCL 1750 MG/350ML IV SOLN
1750.0000 mg | Freq: Once | INTRAVENOUS | Status: AC
  Administered 2020-12-07: 1750 mg via INTRAVENOUS
  Filled 2020-12-07: qty 350

## 2020-12-07 MED ORDER — MAGNESIUM SULFATE 2 GM/50ML IV SOLN
2.0000 g | Freq: Once | INTRAVENOUS | Status: AC
  Administered 2020-12-07: 2 g via INTRAVENOUS
  Filled 2020-12-07: qty 50

## 2020-12-07 MED ORDER — METRONIDAZOLE IN NACL 5-0.79 MG/ML-% IV SOLN
500.0000 mg | Freq: Three times a day (TID) | INTRAVENOUS | Status: DC
Start: 2020-12-07 — End: 2020-12-09
  Administered 2020-12-07 – 2020-12-09 (×7): 500 mg via INTRAVENOUS
  Filled 2020-12-07 (×8): qty 100

## 2020-12-07 MED ORDER — INSULIN ASPART 100 UNIT/ML IJ SOLN
5.0000 [IU] | Freq: Three times a day (TID) | INTRAMUSCULAR | Status: DC
  Administered 2020-12-07 – 2020-12-09 (×6): 5 [IU] via SUBCUTANEOUS

## 2020-12-07 MED ORDER — ACETAMINOPHEN 325 MG PO TABS
650.0000 mg | ORAL_TABLET | Freq: Four times a day (QID) | ORAL | Status: DC | PRN
  Administered 2020-12-07 – 2020-12-11 (×9): 650 mg via ORAL
  Filled 2020-12-07 (×9): qty 2

## 2020-12-07 NOTE — Progress Notes (Signed)
Pharmacy Antibiotic Note  Alexis [name] is a 70 y.o. female admitted on 12/04/2020 with femoral neck fx, now s/p THA with concern for sepsis.  Pharmacy has been consulted for vancomycin and cefepime dosing.  Pt w/ AKI; baseline SCr <0.8, now 1.5.  Plan: Will give vancomycin 1750mg  IV x1; will monitor SCr +/- vanc level prior to redosing. Cefepime 2g IV Q12H.  Weight: 83.7 kg (184 lb 8.4 oz)  Temp (24hrs), Avg:99.2 F (37.3 C), Min:98.1 F (36.7 C), Max:101 F (38.3 C)  Recent Labs  Lab 12/04/20 0549 12/05/20 0416 12/06/20 0256 12/06/20 1709 12/06/20 2124  WBC 10.7* 13.6* 13.0* 9.3 9.5  CREATININE 0.73 1.14* 1.93*  --  1.50*  LATICACIDVEN  --   --   --   --  1.4    Estimated Creatinine Clearance: 37.8 mL/min (A) (by C-G formula based on SCr of 1.5 mg/dL (H)).    Allergies  Allergen Reactions  . Aspirin Other (See Comments)    Reaction: Vasculitis per MD  . Codeine Itching and Rash  . Naproxen Nausea Only    Reaction: Makes stomach upset/irritated.  . Sulfonamide Derivatives Itching and Rash    Thank you for allowing pharmacy to be a part of this patient's care.  Wynona Neat, PharmD, BCPS  12/07/2020 1:44 AM

## 2020-12-07 NOTE — Progress Notes (Signed)
Inpatient Diabetes Program Recommendations  AACE/ADA: New Consensus Statement on Inpatient Glycemic Control (2015)  Target Ranges:  Prepandial:   less than 140 mg/dL      Peak postprandial:   less than 180 mg/dL (1-2 hours)      Critically ill patients:  140 - 180 mg/dL   Lab Results  Component Value Date   GLUCAP 261 (H) 12/07/2020   HGBA1C 10.3 (H) 12/04/2020    Review of Glycemic Control Results for Alexis [name], Alexis [name] (MRN [id number]) as of 12/07/2020 10:04  Ref. Range 12/06/2020 06:46 12/06/2020 12:25 12/06/2020 16:50 12/06/2020 20:23 12/07/2020 07:47  Glucose-Capillary Latest Ref Range: 70 - 99 mg/dL 400 (H) 375 (H) 352 (H) 318 (H) 261 (H)   History:DM  Home DM Meds:Farxiga 10 mg Daily Metformin 500 mg Daily Tresiba 25 units Daily Trulicity 3 mg Qweek  Current Orders:Lantus 25 units qd                 Novolog Moderate Correction Scale/ SSI (0-15 units) TID AC  Inpatient Diabetes Program Recommendations:   -Increase Lantus to 30 units qd -Add Novolog 0-5 units hs  Thank you, Bethena Roys E. Kambrie Eddleman, RN, MSN, CDE  Diabetes Coordinator Inpatient Glycemic Control Team Team Pager [phone] (8am-5pm) 12/07/2020 10:06 AM

## 2020-12-07 NOTE — Progress Notes (Signed)
[facility] night shift.  The nursing staff reports the lab was unable to obtain the blood culture set earlier.  Unfortunately, cefepime is already infusing so I will discontinue the blood culture x2 order.  Tennis Must, MD.

## 2020-12-07 NOTE — Progress Notes (Signed)
Physical Therapy Treatment Patient Details Name: Alexis [name] MRN: [id number] DOB: 08/21/1950 Today's Date: 12/07/2020    History of Present Illness Pt is a 70 yo female who fell and sustained a L hip fracture who then underwent a L THA, anterior approach. PMH: depression, DM II, anxiety PSH: ACDF 04/2018    PT Comments    The pt was able to progress OOB transfers and capacity for gait this session with BP more stable than in initial eval. The pt continues to present with lethargy and cognitive deficits (disorientation, poor problem solving, poor attention) that limit her ability to progress and complete movements with more independence. The pt was also more limited by deficits in strength, power, and activity tolerance, requiring mod to maxA to complete sit-stand transfers and stand-pivot transfers this session with max cues for technique and to maintain eyes open. The pt demos poor wt acceptance to LLE, requires significant assist to wt shift and maintain upright when stepping with RLE. Will continue to benefit from skilled PT to progress functional strength, power, endurance, and alertness with mobility.    Follow Up Recommendations  SNF;Supervision/Assistance - 24 hour     Equipment Recommendations  Rolling walker with 5" wheels    Recommendations for Other Services       Precautions / Restrictions Precautions Precautions: Fall Precaution Comments: pt extremely lethargic Restrictions Weight Bearing Restrictions: Yes LLE Weight Bearing: Weight bearing as tolerated    Mobility  Bed Mobility Overal bed mobility: Needs Assistance Bed Mobility: Supine to Sit     Supine to sit: Mod assist;Max assist;HOB elevated     General bed mobility comments: max VC, modA ot RLE, maxA to LLE, modA with cues to scoot and use UE to pull to sit, maxA to complete transition.    Transfers Overall transfer level: Needs assistance Equipment used: Rolling walker (2 wheeled) Transfers: Sit  to/from [facility] Sit to Stand: Mod assist Stand pivot transfers: Mod assist;Max assist       General transfer comment: modA to power up with poor initial movement/power from pt. strong posterior lean requiring maxA to maintain upright while pt making adjustments from given cues (tuck hips in, stand tall, keep eyes open)  Ambulation/Gait Ambulation/Gait assistance: Max assist Gait Distance (Feet): 4 Feet Assistive device: Rolling walker (2 wheeled) Gait Pattern/deviations: Step-to pattern;Decreased weight shift to left;Decreased stance time - left;Decreased step length - right;Decreased stride length;Shuffle Gait velocity: decreased Gait velocity interpretation: <1.31 ft/sec, indicative of household ambulator General Gait Details: pt taking a few lateral steps for pivot, then x10 steps forward from [facility] to allow for recliner to be pulled behind her. progressively smaller steps with limited clearance. incerased assist to maintain position in RW and hip extension      Balance Overall balance assessment: Needs assistance Sitting-balance support: No upper extremity supported;Feet supported Sitting balance-Leahy Scale: Fair     Standing balance support: Bilateral upper extremity supported Standing balance-Leahy Scale: Fair Standing balance comment: used walker for standing                            Cognition Arousal/Alertness: Lethargic;Suspect due to medications (difficulty maintaining eyes open, also significant confusion) Behavior During Therapy: Flat affect Overall Cognitive Status: Impaired/Different from baseline Area of Impairment: Orientation;Attention;Memory;Following commands;Safety/judgement;Awareness;Problem solving                 Orientation Level: Disoriented to;Place;Time;Situation (unable to state day of week, even following multiple different  cues and being told day of week) Current Attention Level: Focused (max cues to maintain  eyes open) Memory: Decreased short-term memory;Decreased recall of precautions Following Commands: Follows one step commands with increased time;Follows one step commands inconsistently Safety/Judgement: Decreased awareness of safety;Decreased awareness of deficits Awareness: Intellectual Problem Solving: Slow processing;Decreased initiation;Difficulty sequencing;Requires verbal cues;Requires tactile cues General Comments: pt lethargic, answering questions initially, BP stable with all transitions, but pt with onset of difficulty maintaining eyes open once sitting EOB. one daughter gave max cues for eyes open, pt with significant confusion. lifting fingers to mouth and stating she is trying to have a sip of water (despite not holding cup). pt redirected, but had multiple situations like this through session         General Comments General comments (skin integrity, edema, etc.): edema at pelvis, thigh. significant confusion this session but BP stable      Pertinent Vitals/Pain Pain Assessment: Faces Faces Pain Scale: Hurts little more Pain Location: L hip with wt bearing Pain Descriptors / Indicators: Discomfort;Grimacing Pain Intervention(s): Limited activity within patient's tolerance;Monitored during session;Repositioned           PT Goals (current goals can now be found in the care plan section) Acute Rehab PT Goals Patient Stated Goal: to try to keep her eyes open PT Goal Formulation: With patient/family Time For Goal Achievement: 12/20/20 Potential to Achieve Goals: Good Progress towards PT goals: Progressing toward goals    Frequency    Min 3X/week      PT Plan Current plan remains appropriate       AM-PAC PT "6 Clicks" Mobility   Outcome Measure  Help needed turning from your back to your side while in a flat bed without using bedrails?: A Lot Help needed moving from lying on your back to sitting on the side of a flat bed without using bedrails?: A Lot Help  needed moving to and from a bed to a chair (including a wheelchair)?: A Lot Help needed standing up from a chair using your arms (e.g., wheelchair or bedside chair)?: A Lot Help needed to walk in hospital room?: A Lot Help needed climbing 3-5 steps with a railing? : A Lot 6 Click Score: 12    End of Session Equipment Utilized During Treatment: Gait belt Activity Tolerance: Patient limited by lethargy Patient left: in chair;with call bell/phone within reach;with chair alarm set;with family/visitor present Nurse Communication: Mobility status PT Visit Diagnosis: Unsteadiness on feet (R26.81);Muscle weakness (generalized) (M62.81);Difficulty in walking, not elsewhere classified (R26.2)     Time: 2919-1660 PT Time Calculation (min) (ACUTE ONLY): 48 min  Charges:  $Gait Training: 8-22 mins $Therapeutic Activity: 23-37 mins                     Karma Ganja, PT, DPT   Acute Rehabilitation Department Pager #: [phone]   Otho Bellows 12/07/2020, 4:55 PM

## 2020-12-07 NOTE — Progress Notes (Signed)
Nutrition Follow-up  DOCUMENTATION CODES:   Not applicable  INTERVENTION:  Provide Ensure Enlive po BID, each supplement provides 350 kcal and 20 grams of protein.  Encourage adequate PO intake.   NUTRITION DIAGNOSIS:   Increased nutrient needs related to hip fracture as evidenced by estimated needs; ongoing  GOAL:   Patient will meet greater than or equal to 90% of their needs; progressing  MONITOR:   PO intake,Supplement acceptance,Weight trends,Labs,I & O's  REASON FOR ASSESSMENT:   Consult Hip fracture protocol  ASSESSMENT:   Patient with PMH significant for HTN, HLD, CAD, DM with peripheral neuropathy, memory difficulties, uterine cancer s/p hysterectomy, and OSA. Presents this admission after mechanical fall resulting in L hip fracture.   4/26- s/p L total hip arthroplasty   Meal completion has been 50%. Pt reports some nausea and abdominal fullness/discomfort. Pt reports eating well PTA with no difficulties. RD to order nutritional supplements to aid in caloric and protein needs. Family at bedside has been encouraging pt po intake.   NUTRITION - FOCUSED PHYSICAL EXAM:  Flowsheet Row Most Recent Value  Orbital Region No depletion  Upper Arm Region No depletion  Thoracic and Lumbar Region No depletion  Buccal Region No depletion  Temple Region No depletion  Clavicle Bone Region No depletion  Clavicle and Acromion Bone Region No depletion  Scapular Bone Region No depletion  Dorsal Hand No depletion  Patellar Region No depletion  Anterior Thigh Region No depletion  Posterior Calf Region No depletion  Edema (RD Assessment) Mild  Hair Reviewed  Eyes Reviewed  Mouth Reviewed  Skin Reviewed  Nails Reviewed     Labs and medications reviewed.   Diet Order:   Diet Order            Diet Carb Modified Fluid consistency: Thin; Room service appropriate? Yes  Diet effective now                 EDUCATION NEEDS:   Not appropriate for education at this  time  Skin:  Skin Assessment: Skin Integrity Issues: Skin Integrity Issues:: Incisions Incisions: L hip  Last BM:  4/24  Height:   Ht Readings from Last 1 Encounters:  11/07/20 5\' 5"  (1.651 m)    Weight:   Wt Readings from Last 1 Encounters:  12/06/20 83.7 kg   BMI:  Body mass index is 30.71 kg/m.  Estimated Nutritional Needs:   Kcal:  1800-2000 kcal  Protein:  90-105 grams  Fluid:  >/= 1.8 L/day  Corrin Parker, MS, RD, LDN RD pager number/after hours weekend pager number on Amion.

## 2020-12-07 NOTE — Progress Notes (Signed)
  Echocardiogram 2D Echocardiogram has been performed.  Alexis [name] 12/07/2020, 12:25 PM

## 2020-12-07 NOTE — Progress Notes (Addendum)
    Subjective:  Patient reports pain as mild to moderate.  Denies N/V/CP/SOB. Patient is very sleepy at this morning's exam.  Multiple medical events overnight including runs of A-fib RVR being treated by hospitalist team and cardiology. Patient was transferred to progressive care  Objective:   VITALS:   Vitals:   12/07/20 0610 12/07/20 0625 12/07/20 0640 12/07/20 0655  BP: (!) 82/47 (!) 109/55 (!) 111/52 (!) 107/53  Pulse: 80 79 77 72  Resp: (!) 26 (!) 22 (!) 23 20  Temp:      TempSrc:      SpO2: 97% 95% 96% 95%  Weight:        NAD ABD soft Neurovascular intact Sensation intact distally Intact pulses distally Dorsiflexion/Plantar flexion intact Incision: dressing C/D/I   Lab Results  Component Value Date   WBC 9.5 12/06/2020   HGB 6.4 (LL) 12/06/2020   HCT 20.1 (L) 12/06/2020   MCV 82.4 12/06/2020   PLT 142 (L) 12/06/2020   BMET    Component Value Date/Time   NA 128 (L) 12/06/2020 2124   NA 142 11/10/2020 1226   K 3.6 12/06/2020 2124   CL 99 12/06/2020 2124   CO2 23 12/06/2020 2124   GLUCOSE 341 (H) 12/06/2020 2124   BUN 47 (H) 12/06/2020 2124   BUN 12 11/10/2020 1226   CREATININE 1.50 (H) 12/06/2020 2124   CALCIUM 7.5 (L) 12/06/2020 2124   GFRNONAA 37 (L) 12/06/2020 2124   GFRAA >60 05/07/2018 4259     Assessment/Plan: 2 Days Post-Op   Principal Problem:   Subcapital fracture of left femur (HCC) Active Problems:   Hyperlipidemia   Anxiety and depression   OSA (obstructive sleep apnea)   Coronary artery calcification seen on computed tomography   Type 2 diabetes mellitus (HCC)   Leukocytosis   Fall at home, initial encounter   Hypotension   Acute metabolic encephalopathy   WBAT with walker DVT ppx: Holding in hospital currently (chemical), SCDs, TEDS   Lovenox at D/C for 30 days PO pain control PT/OT ABLA:  2 units PRBC transfused overnight Hypotension, other medical concerns: treat per hospitalist recommendations Dispo:  Pending     Dorothyann Peng 12/07/2020, 7:40 AM [facility] is now [address] [place], [place], [place] [address] Phone: [phone] [email or web address] Facebook  [employer]

## 2020-12-07 NOTE — Progress Notes (Addendum)
Rapid response notified about patient's burst of vtach and afib sustaining in the 150s. MD also paged. EKG at bedside. EKG printed out strip but by this time patient reverted back to NSR 90s-. Patient denies chest pain, palpitaitons, and discomfort and falls back to sleep. EKG shown to MD as well as the strip telemetry saved to chart.   8416- MD Olevia Bowens at bedside assessing patient. Because patient went back to NSR 90s, will hold off on amiodarone as of now. Cards consulted. Will continue to infuse 2nd PRBCs and restarted another IV for continued antibiotics. Patient mouth breathing, 02 up to 4L via nasal cannula  0356- mews yellow d/t decrease in blood pressure.   0400-  Responds to voice and touch. Mouth breathing and loudly snoring. 02 sats up to 4L via nasal cannula. Administering 2nd PRBCs at this time. EKG strips placed in patient's chart. Will continue to observe.   0445- Mews fired red due to elevated HR and rhythm change on monitor.   0455- Mews fired red due to continuance of elevated HR and rhythm change.   0510- Elevated HR and rhythm change fired red mews score.  6063- Mews fired red due to elevated HR and rhythm change.   0540- Elevated HR fired mews red again. Cardiology consulted, scheduled meds to be given. Rapid response was notified for this patient as well as the in house triad who came to patient's bedside. Cardiology also came to see this patient this am as well. New orders noted.

## 2020-12-07 NOTE — Care Management Important Message (Signed)
Important Message  Patient Details  Name: Alexis [name] MRN: [id number] Date of Birth: 08-Jan-1951   Medicare Important Message Given:  Yes     Kinslie Hove 12/07/2020, 12:20 PM

## 2020-12-07 NOTE — Progress Notes (Signed)
Pharmacy Antibiotic Note  Alexis [name] is a 70 y.o. female admitted on 12/04/2020 with femoral neck fx, now s/p THA with concern for sepsis - unknown source.  Pharmacy has been consulted for vancomycin and cefepime dosing. Also on Flagyl per MD. Noted AKI - SCr trend down to 1.22 today.  Patient loaded with vancomycin this AM.  Plan: Continue vancomycin 750mg  IV q24h. Goal AUC 400-550. Expected AUC: 486 SCr used: 1.22 Cefepime 2g IV q12h Flagyl 500mg  IV q8h per MD Monitor clinical progress, c/s, renal function, vancomycin levels as indicated F/u de-escalation plan/LOT - spoke with Dr. Kurtis Bushman, keeping full regimen for now with fevers   Weight: 83.7 kg (184 lb 8.4 oz)  Temp (24hrs), Avg:99.1 F (37.3 C), Min:97.6 F (36.4 C), Max:101 F (38.3 C)  Recent Labs  Lab 12/04/20 0549 12/05/20 0416 12/06/20 0256 12/06/20 1709 12/06/20 2124 12/07/20 0826  WBC 10.7* 13.6* 13.0* 9.3 9.5 10.5  CREATININE 0.73 1.14* 1.93*  --  1.50* 1.22*  LATICACIDVEN  --   --   --   --  1.4  --     Estimated Creatinine Clearance: 46.5 mL/min (A) (by C-G formula based on SCr of 1.22 mg/dL (H)).    Allergies  Allergen Reactions  . Aspirin Other (See Comments)    Reaction: Vasculitis per MD  . Codeine Itching and Rash  . Naproxen Nausea Only    Reaction: Makes stomach upset/irritated.  . Sulfonamide Derivatives Itching and Rash    Arturo Morton, PharmD, BCPS Please check AMION for all [place] contact numbers Clinical Pharmacist 12/07/2020 1:29 PM

## 2020-12-07 NOTE — Progress Notes (Addendum)
TRH night shift.  The staff reported that the patient was in V. tach with heart rate in the 140s.  Upon review of the available rhythm strips he seems that the patient was in atrial fibrillation with RVR.  Her CHA?DS?-VASc Score is at least 5 (female, age 70, DM, hypertension and aortic atherosclerosis/CAD).  She is now back in sinus rhythm.  I have deferred anticoagulation given acute blood loss anemia and postop status.  An echocardiogram has been ordered.  Cardiology will consult.   Tennis Must, MD.  0500 Addendum: The staff reports that the patient is again A. fib with RVR in the 130s.  Her BP has improved, but remains soft.  Stat magnesium and BMP BMP along with amiodarone 150 mg IVP x1 dose ordered.

## 2020-12-07 NOTE — Progress Notes (Addendum)
PROGRESS NOTE    Alexis [name]  UXL:[id number] DOB: May 16, 1951 DOA: 12/04/2020 PCP: Deland Pretty, MD    Brief Narrative:  Patient is a 70 year old female with history of hypertension, hyperlipidemia, coronary artery disease, diabetes type 2 with peripheral neuropathy, uterine cancer status post hysterectomy, OSA  from home after falling at home.  No history of loss of consciousness or head injury.  After the fall ,she immediately developed pain on the right hip and was unable to bear weight.  On presentation, she was hypertensive, x-ray showed subcapital fracture of the left femur.  Orthopedics consulted and plan is for ORIF  4/28-overnight issues were noted.  Early this morning patient was in atrial fibrillation, status post 1 unit blood transfusion.  Cardiology was consulted.  Consultants:   orthopedics  Procedures:   Antimicrobials:    vanco, cefep, meto 4/27   Subjective: Patient seen and examined.  Daughter is at bedside.  Patient denies chest pain, shortness of breath, or dizziness.    This SmartLink has not been configured with any valid records.    Objective: Vitals:   12/07/20 0640 12/07/20 0655 12/07/20 0740 12/07/20 0749  BP: (!) 111/52 (!) 107/53 (!) 104/55 (!) 103/57  Pulse: 77 72 71 71  Resp: (!) 23 20 (!) 25 20  Temp:    97.6 F (36.4 C)  TempSrc:    Oral  SpO2: 96% 95% 96% 95%  Weight:        Intake/Output Summary (Last 24 hours) at 12/07/2020 0802 Last data filed at 12/07/2020 5366 Gross per 24 hour  Intake 1909.48 ml  Output 1350 ml  Net 559.48 ml   Filed Weights   12/06/20 1400  Weight: 83.7 kg    Examination: Calm, cooperative, NAD +jvp cta no r/w/r Reg, s1/s2 no gallop Soft benign, +bs, ecchymosis of LQ Mild b/l LE edema. scd in place. RUE edema, no pain illicited Grossly intact   Data Reviewed: I have personally reviewed following labs and imaging studies  CBC: Recent Labs  Lab 12/04/20 0549 12/05/20 0416 12/06/20 0256  12/06/20 1709 12/06/20 2124  WBC 10.7* 13.6* 13.0* 9.3 9.5  NEUTROABS 7.9*  --  10.5*  --  7.1  HGB 12.5 12.9 8.1* 7.0* 6.4*  HCT 40.4 41.1 25.9* 22.3* 20.1*  MCV 85.2 82.9 85.2 83.5 82.4  PLT 302 283 185 154 440*   Basic Metabolic Panel: Recent Labs  Lab 12/04/20 0549 12/05/20 0416 12/06/20 0256 12/06/20 2124  NA 137 135 130* 128*  K 4.4 4.0 4.5 3.6  CL 101 99 94* 99  CO2 27 27 26 23   GLUCOSE 243* 305* 404* 341*  BUN 14 20 36* 47*  CREATININE 0.73 1.14* 1.93* 1.50*  CALCIUM 9.0 9.1 8.0* 7.5*   GFR: Estimated Creatinine Clearance: 37.8 mL/min (A) (by C-G formula based on SCr of 1.5 mg/dL (H)). Liver Function Tests: Recent Labs  Lab 12/04/20 0549 12/06/20 2124  AST 32 29  ALT 19 9  ALKPHOS 130* 57  BILITOT 0.8 0.6  PROT 6.9 4.5*  ALBUMIN 3.8 2.1*   No results for input(s): LIPASE, AMYLASE in the last 168 hours. No results for input(s): AMMONIA in the last 168 hours. Coagulation Profile: No results for input(s): INR, PROTIME in the last 168 hours. Cardiac Enzymes: No results for input(s): CKTOTAL, CKMB, CKMBINDEX, TROPONINI in the last 168 hours. BNP (last 3 results) No results for input(s): PROBNP in the last 8760 hours. HbA1C: No results for input(s): HGBA1C in the last 72 hours. CBG:  Recent Labs  Lab 12/06/20 0646 12/06/20 1225 12/06/20 1650 12/06/20 2023 12/07/20 0747  GLUCAP 400* 375* 352* 318* 261*   Lipid Profile: No results for input(s): CHOL, HDL, LDLCALC, TRIG, CHOLHDL, LDLDIRECT in the last 72 hours. Thyroid Function Tests: No results for input(s): TSH, T4TOTAL, FREET4, T3FREE, THYROIDAB in the last 72 hours. Anemia Panel: No results for input(s): VITAMINB12, FOLATE, FERRITIN, TIBC, IRON, RETICCTPCT in the last 72 hours. Sepsis Labs: Recent Labs  Lab 12/06/20 2124  LATICACIDVEN 1.4    Recent Results (from the past 240 hour(s))  Resp Panel by RT-PCR (Flu A&B, Covid) Nasopharyngeal Swab     Status: None   Collection Time: 12/04/20   8:13 AM   Specimen: Nasopharyngeal Swab; Nasopharyngeal(NP) swabs in vial transport medium  Result Value Ref Range Status   SARS Coronavirus 2 by RT PCR NEGATIVE NEGATIVE Final    Comment: (NOTE) SARS-CoV-2 target nucleic acids are NOT DETECTED.  The SARS-CoV-2 RNA is generally detectable in upper respiratory specimens during the acute phase of infection. The lowest concentration of SARS-CoV-2 viral copies this assay can detect is 138 copies/mL. A negative result does not preclude SARS-Cov-2 infection and should not be used as the sole basis for treatment or other patient management decisions. A negative result may occur with  improper specimen collection/handling, submission of specimen other than nasopharyngeal swab, presence of viral mutation(s) within the areas targeted by this assay, and inadequate number of viral copies(<138 copies/mL). A negative result must be combined with clinical observations, patient history, and epidemiological information. The expected result is Negative.  Fact Sheet for Patients:  EntrepreneurPulse.com.au  Fact Sheet for Healthcare Providers:  IncredibleEmployment.be  This test is no t yet approved or cleared by the [place] FDA and  has been authorized for detection and/or diagnosis of SARS-CoV-2 by FDA under an Emergency Use Authorization (EUA). This EUA will remain  in effect (meaning this test can be used) for the duration of the COVID-19 declaration under Section 564(b)(1) of the Act, 21 U.S.C.section 360bbb-3(b)(1), unless the authorization is terminated  or revoked sooner.       Influenza A by PCR NEGATIVE NEGATIVE Final   Influenza B by PCR NEGATIVE NEGATIVE Final    Comment: (NOTE) The Xpert Xpress SARS-CoV-2/FLU/RSV plus assay is intended as an aid in the diagnosis of influenza from Nasopharyngeal swab specimens and should not be used as a sole basis for treatment. Nasal washings and aspirates  are unacceptable for Xpert Xpress SARS-CoV-2/FLU/RSV testing.  Fact Sheet for Patients: EntrepreneurPulse.com.au  Fact Sheet for Healthcare Providers: IncredibleEmployment.be  This test is not yet approved or cleared by the [place] FDA and has been authorized for detection and/or diagnosis of SARS-CoV-2 by FDA under an Emergency Use Authorization (EUA). This EUA will remain in effect (meaning this test can be used) for the duration of the COVID-19 declaration under Section 564(b)(1) of the Act, 21 U.S.C. section 360bbb-3(b)(1), unless the authorization is terminated or revoked.  Performed at [facility], [place] [address] [place],  [address]          Radiology Studies: CT HEAD WO CONTRAST  Result Date: 12/06/2020 CLINICAL DATA:  Change in mental status. EXAM: CT HEAD WITHOUT CONTRAST TECHNIQUE: Contiguous axial images were obtained from the base of the skull through the vertex without intravenous contrast. COMPARISON:  None. FINDINGS: Brain: Normal for age atrophy. No intracranial hemorrhage, mass effect, or midline shift. No hydrocephalus. The basilar cisterns are patent. No evidence of territorial infarct or  acute ischemia. No extra-axial or intracranial fluid collection. Vascular: Atherosclerosis of skullbase vasculature without hyperdense vessel or abnormal calcification. Skull: No fracture or focal lesion. Sinuses/Orbits: Paranasal sinuses and mastoid air cells are clear. The visualized orbits are unremarkable. Other: None. IMPRESSION: Unremarkable noncontrast head CT for age. Electronically Signed   By: Keith Rake M.D.   On: 12/06/2020 23:28   Pelvis Portable  Result Date: 12/05/2020 CLINICAL DATA:  Postop. EXAM: PORTABLE PELVIS 1-2 VIEWS COMPARISON:  Preoperative radiograph yesterday. FINDINGS: Left hip arthroplasty in expected alignment. No periprosthetic lucency or fracture. Recent postsurgical change includes air  and edema in the soft tissues. Lateral skin staples. IMPRESSION: Left hip arthroplasty without immediate postoperative complication. Electronically Signed   By: Keith Rake M.D.   On: 12/05/2020 16:09   DG CHEST PORT 1 VIEW  Result Date: 12/06/2020 CLINICAL DATA:  Altered mental status EXAM: PORTABLE CHEST 1 VIEW COMPARISON:  December 04, 2020 FINDINGS: Cardiac enlargement with central vascular prominence. Aortic atherosclerosis. Low lung volumes with bibasilar atelectasis. No overt pulmonary edema. Partially visualized anterior spinal fusion hardware. IMPRESSION: Cardiac enlargement with central vascular prominence. Electronically Signed   By: Dahlia Bailiff MD   On: 12/06/2020 21:48   DG C-Arm 1-60 Min  Result Date: 12/05/2020 CLINICAL DATA:  Elective surgery. Additional history provided: Total left hip arthroplasty with anterior approach. Provided fluoroscopy time: 12 seconds (2.2132 mGy). EXAM: OPERATIVE left HIP (WITH PELVIS IF PERFORMED) 2 VIEWS TECHNIQUE: Fluoroscopic spot image(s) were submitted for interpretation post-operatively. COMPARISON:  Radiographs of the left femur and pelvis 12/04/2020. FINDINGS: Two intraoperative fluoroscopic images of the left hip are submitted. On the provided images, there is evidence of interval left total hip arthroplasty. The femoral and acetabular components appear well seated. No unexpected finding on the provided views. IMPRESSION: Two intraoperative fluoroscopic images from left total hip arthroplasty, as described. Electronically Signed   By: Kellie Simmering DO   On: 12/05/2020 14:55   DG HIP OPERATIVE UNILAT W OR W/O PELVIS LEFT  Result Date: 12/05/2020 CLINICAL DATA:  Elective surgery. Additional history provided: Total left hip arthroplasty with anterior approach. Provided fluoroscopy time: 12 seconds (2.2132 mGy). EXAM: OPERATIVE left HIP (WITH PELVIS IF PERFORMED) 2 VIEWS TECHNIQUE: Fluoroscopic spot image(s) were submitted for interpretation  post-operatively. COMPARISON:  Radiographs of the left femur and pelvis 12/04/2020. FINDINGS: Two intraoperative fluoroscopic images of the left hip are submitted. On the provided images, there is evidence of interval left total hip arthroplasty. The femoral and acetabular components appear well seated. No unexpected finding on the provided views. IMPRESSION: Two intraoperative fluoroscopic images from left total hip arthroplasty, as described. Electronically Signed   By: Kellie Simmering DO   On: 12/05/2020 14:55        Scheduled Meds: . atorvastatin  20 mg Oral QHS  . clopidogrel  75 mg Oral Daily  . docusate sodium  100 mg Oral BID  . insulin aspart  0-15 Units Subcutaneous TID WC  . insulin glargine  25 Units Subcutaneous Daily  . metoprolol tartrate  25 mg Oral Q6H  . midodrine  2.5 mg Oral TID WC  . pantoprazole  40 mg Oral BID  . polyethylene glycol  17 g Oral Daily  . pregabalin  50 mg Oral BID  . tamsulosin  0.4 mg Oral QHS  . vancomycin variable dose per unstable renal function (pharmacist dosing)   Does not apply See admin instructions  . venlafaxine XR  150 mg Oral BID   Continuous Infusions: .  sodium chloride 100 mL/hr at 12/06/20 1716  . ceFEPime (MAXIPIME) IV 2 g (12/07/20 0209)  . metronidazole 500 mg (12/07/20 0353)  . vancomycin 1,750 mg (12/07/20 5956)    Assessment & Plan:   Principal Problem:   Subcapital fracture of left femur (HCC) Active Problems:   Hyperlipidemia   Anxiety and depression   OSA (obstructive sleep apnea)   Coronary artery calcification seen on computed tomography   Type 2 diabetes mellitus (HCC)   Leukocytosis   Fall at home, initial encounter   Hypotension   Acute metabolic encephalopathy   Paroxysmal atrial fibrillation (HCC)   Left subcapital femur fracture: POD2  Secondary to mechanical fall  Status post left total hip arthroplasty on 4/26 PT recommends SNF Per Ortho Dr. Lyla Glassing okay to start Lovenox for DVT prophylaxis via  chat 4/28-Ortho will like patient to be on aspirin 81 mg twice daily at discharge however patient is allergic to aspirin we will discuss this with him. SCDs for DVT prophylaxis as her hemoglobin had dropped and needed transfusion.   Leukocytosis: low grade post op fever last night. Doubt infection. But will ck ua, bcx as she was started on ivabx with metro, vanc, cefepime  PAF/RVR-early this am. Now in [place] Cardiology consulted, input appreciated. Likely multifactorial- has OSA with profoundly anemic post op Started on beta blk.  Continue amiodarone Ck TSH Ck echo Correct anemia Once stable , may need consider [place] before discharge. Will discuss this further with cards and family.     Post op blood loss- Hg 6.4  S/p 2units PRBC Hg 9.5 now Ck occult stool since on plavix to r/o gib  Urinary retention- bladder scan 235ml. Not voided. S/p place foley  Post op Bowel regimen. Continue flomax   AKI:  Renal function worsening. NO void. Possibly multifactorial as bp low, urinary retention, and BG elevated (prerenal) 4/28-improving, cr 1.22, with foley and ivf. Continue to monitor   Diabetes type 2: A1c of 10.3   Takes Farxiga, metformin, Tresiba, dulaglutide at home. BG uncontrolled  Increase Lantus to 30 mg daily  R-ISS  Add NovoLog 5 units 3 times daily with meals    Coronary artery disease: Recently had coronary CTA which showed diffuse nonobstructive coronary artery disease.  On statin and Plavix at home. 4/28 continue Plavix Unable to take aspirin due to GI issues per daughter Replace magnesium as level is 1.7 today  Hypertension:  Now hypotensive. Initially BP meds were held, due to atrial fibrillation and rate control cardiology started metoprolol 25 mg every 6 hours Midodrine increased to 5 mg 3 times daily   Hyperlipidemia: On Lipitor  Anxiety/depression: On venlafaxine, on Ativan as needed  OSA: Does not use CPAP  GERD: Protonix     DVT  prophylaxis: lovenox Code Status:full Family Communication: Daughter at bedside on  Status is: Inpatient  Remains inpatient appropriate because:Inpatient level of care appropriate due to severity of illness   Dispo: The patient is from: Home              Anticipated d/c is to: SNF              Patient currently is not medically stable to d/c.   Difficult to place patient No            LOS: 3 days   Time spent: 45 minutes with more than 50% on [place], MD [facility] Pager [phone]  If 7PM-7AM, please contact night-coverage 12/07/2020,  8:02 AM

## 2020-12-07 NOTE — Consult Note (Addendum)
Cardiology Consultation:   Patient ID: CLEOTILDE DEGRAFF MRN: [id number]; DOB: 1950-12-20  Admit date: 12/04/2020 Date of Consult: 12/07/2020  Primary Care Provider: Deland Pretty, MD [facility] Cardiologist: Pixie Casino, MD  [facility] Electrophysiologist:  None   Patient Profile:   KORYNN PAOLA is a 70 y.o. female with HLD,HTN, DM2, fatty liver disease, IBS,CAD, OSAand GERD who is hospitalized for L hip fx now s/p L THA who cardiology is consulted for post op AF.   History of Present Illness:   Ms. Spinella does not have documented history of AF but does have hx of short runs of SVT along with frequently PACs/PVCs. She has been on varying doses of toprol XL since at least 2012 per chart review (anywhere from 50 mg PO daily to 100 mg PO bid). She has been on toprol XL 100 mg PO bid since at least 2019. She is not on [place].  She is somewhat altered on exam but oriented to person and place but not date or time.  She does not recall any history of atrial fibrillation and on chart review I do not see any documented episodes.  She is otherwise asymptomatic currently.  Past Medical History:  Diagnosis Date  . Anxiety   . Arthritis    "knees" (05/25/2014)  . Bursitis of knee    "both"  . Chronic back pain   . Depression   . Diabetes mellitus type II   . Diabetic peripheral neuropathy ([place]) 10/20/2018  . Diverticulitis   . Fatty liver   . Fibromyalgia   . Gastric polyp    hyperplastic  . Gastroparesis    "recently dx'd" (05/25/2014)  . GERD (gastroesophageal reflux disease)   . H/O hiatal hernia   . [id number])    "monthly" (05/25/2014)  . Heart attack ([place]) 2014   "mild"  . Hx of gastritis   . Hyperlipidemia   . Hypertension   . Irritable bowel syndrome (IBS)   . Memory difficulties 09/18/2017  . Migraine without aura, without mention of intractable migraine without mention of status migrainosus    "related to allergies; have them in the spring and fall"  (05/25/2014)  . Obesity   . Pancreatic cyst    peripancreatic cystic lesion  . Renal cyst   . Sleep apnea    "wore mask; took it off in my sleep; quit wearing it" (05/25/2014)  . Sprain of neck 03/18/2013  . Uterine cancer ([place]) dx'd 2000   surg only  . Vasculitis ([place])    "irritates my legs"  . Wears glasses    Past Surgical History:  Procedure Laterality Date  . ANTERIOR CERVICAL DECOMPRESSION/DISCECTOMY FUSION 4 LEVELS N/A 05/07/2018   Procedure: ANTERIOR CERVICAL DECOMPRESSION FUSION, CERVICAL 4-5,CERVICAL 5-6, CERVICAL 6-7 WITH INSTRUMENTATION AND ALLOGRAFT;  Surgeon: Phylliss Bob, MD;  Location: [facility];  Service: Orthopedics;  Laterality: N/A;  . APPENDECTOMY  ~ 1967  . BREAST CYST EXCISION Right 1990  . COLONOSCOPY    . LAPAROSCOPIC CHOLECYSTECTOMY  1990  . LEFT HEART CATHETERIZATION WITH CORONARY ANGIOGRAM N/A 05/26/2014   Procedure: LEFT HEART CATHETERIZATION WITH CORONARY ANGIOGRAM;  Surgeon: Troy Sine, MD;  Location: [facility] C[facility] LAB;  Service: Cardiovascular;  Laterality: N/A;  . SINUS SURGERY WITH INSTATRAK  2000  . TOTAL HIP ARTHROPLASTY Left 12/05/2020   Procedure: TOTAL HIP ARTHROPLASTY ANTERIOR APPROACH;  Surgeon: Rod Can, MD;  Location: [place];  Service: Orthopedics;  Laterality: Left;  . TUBAL LIGATION  ~ 1982  . VAGINAL  HYSTERECTOMY  2000    Home Medications:  Prior to Admission medications   Medication Sig Start Date End Date Taking? Authorizing Provider  acetaminophen (TYLENOL) 500 MG tablet Take 500 mg by mouth every 6 (six) hours as needed for moderate pain or headache.    Yes [provider]  ALPRAZolam Duanne Moron) 0.5 MG tablet Take 0.5-1 mg by mouth at bedtime as needed for anxiety.    Yes [provider]  amLODipine (NORVASC) 5 MG tablet Take 5 mg by mouth at bedtime. 08/27/19  Yes [provider]  atorvastatin (LIPITOR) 20 MG tablet Take 20 mg by mouth at bedtime.   Yes [provider]  b complex vitamins tablet Take  1 tablet by mouth at bedtime.   Yes [provider]  clopidogrel (PLAVIX) 75 MG tablet TAKE 1 TABLET BY MOUTH EVERY DAY Patient taking differently: Take 75 mg by mouth daily. 12/30/19  Yes Kathrynn Ducking, MD  cyanocobalamin 1000 MCG tablet Take 1,000 mcg by mouth at bedtime.   Yes [provider]  Dulaglutide 3 MG/0.5ML SOPN Inject 3 mg into the skin every Sunday. 10/19/19  Yes [provider]  FARXIGA 10 MG TABS tablet Take 10 mg by mouth daily. 09/10/19  Yes [provider]  fish oil-omega-3 fatty acids 1000 MG capsule Take 1 g by mouth at bedtime.    Yes [provider]  furosemide (LASIX) 20 MG tablet TAKE 1 TABLET (20 MG TOTAL) BY MOUTH AS NEEDED FOR EDEMA. Patient taking differently: Take 20 mg by mouth daily as needed for edema. 08/20/19  Yes Almyra Deforest, PA  HYDROcodone-acetaminophen (NORCO/VICODIN) 5-325 MG tablet Take 1 tablet by mouth every 4 (four) hours as needed for up to 7 days for moderate pain. 12/07/20 12/14/20 Yes Cherlynn June B, PA  metFORMIN (GLUCOPHAGE-XR) 500 MG 24 hr tablet Take 500 mg by mouth daily. 08/24/19  Yes [provider]  metoprolol succinate (TOPROL-XL) 50 MG 24 hr tablet Take 100 mg by mouth 2 (two) times daily. Take with or immediately following a meal.   Yes [provider]  pregabalin (LYRICA) 50 MG capsule Take 50 mg by mouth 2 (two) times daily.   Yes [provider]  RABEprazole (ACIPHEX) 20 MG tablet Take 1 tablet (20 mg total) by mouth 2 (two) times daily. 04/22/12  Yes Lafayette Dragon, MD  rizatriptan (MAXALT) 10 MG tablet Take 10 mg by mouth as needed for migraine. May repeat in 2 hours if needed   Yes [provider]  tamsulosin (FLOMAX) 0.4 MG CAPS capsule Take 0.4 mg by mouth at bedtime.  07/17/16  Yes [provider]  traMADol (ULTRAM) 50 MG tablet Take 100 mg by mouth 2 (two) times daily. 09/13/19  Yes [provider]  TRESIBA FLEXTOUCH 100 UNIT/ML FlexTouch Pen  Inject 25 Units into the skin daily. 09/29/19  Yes [provider]  valsartan (DIOVAN) 320 MG tablet Take 320 mg by mouth daily. 09/10/19  Yes [provider]  venlafaxine XR (EFFEXOR-XR) 150 MG 24 hr capsule Take 150 mg by mouth 2 (two) times daily.   Yes [provider]  zaleplon (SONATA) 5 MG capsule Take 5 mg by mouth at bedtime.   Yes [provider]    Inpatient Medications: Scheduled Meds: . atorvastatin  20 mg Oral QHS  . clopidogrel  75 mg Oral Daily  . docusate sodium  100 mg Oral BID  . insulin aspart  0-15 Units Subcutaneous TID WC  .  insulin glargine  25 Units Subcutaneous Daily  . metoprolol tartrate  25 mg Oral Q6H  . midodrine  2.5 mg Oral TID WC  . pantoprazole  40 mg Oral BID  . polyethylene glycol  17 g Oral Daily  . pregabalin  50 mg Oral BID  . tamsulosin  0.4 mg Oral QHS  . vancomycin variable dose per unstable renal function (pharmacist dosing)   Does not apply See admin instructions  . venlafaxine XR  150 mg Oral BID   Continuous Infusions: . sodium chloride 100 mL/hr at 12/06/20 1716  . ceFEPime (MAXIPIME) IV 2 g (12/07/20 0209)  . metronidazole 500 mg (12/07/20 0353)  . vancomycin 1,750 mg (12/07/20 0605)   PRN Meds: acetaminophen, ALPRAZolam, diphenhydrAMINE, menthol-cetylpyridinium **OR** phenol, metoCLOPramide **OR** metoCLOPramide (REGLAN) injection, ondansetron **OR** ondansetron (ZOFRAN) IV, SUMAtriptan, zolpidem  Allergies:    Allergies  Allergen Reactions  . Aspirin Other (See Comments)    Reaction: Vasculitis per MD  . Codeine Itching and Rash  . Naproxen Nausea Only    Reaction: Makes stomach upset/irritated.  . Sulfonamide Derivatives Itching and Rash   Social History:   Social History   Socioeconomic History  . Marital status: Married    Spouse name: Not on file  . Number of children: 2  . Years of education: Not on file  . Highest education level: Not on file  Occupational History  .  Occupation: Retired    Fish farm manager: RETIRED  Tobacco Use  . Smoking status: Never Smoker  . Smokeless tobacco: Never Used  Vaping Use  . Vaping Use: Never used  Substance and Sexual Activity  . Alcohol use: No  . Drug use: No  . Sexual activity: Never  Other Topics Concern  . Not on file  Social History Narrative   Daily Caffeine, Coke   Social Determinants of Health   Financial Resource Strain: Not on file  Food Insecurity: Not on file  Transportation Needs: Not on file  Physical Activity: Not on file  Stress: Not on file  Social Connections: Not on file  Intimate Partner Violence: Not on file    Family History:    Family History  Problem Relation Age of Onset  . Breast cancer Sister   . Colon polyps Father   . Heart disease Father   . Colon cancer Maternal Uncle   . Ovarian cancer Maternal Aunt   . Stomach cancer Maternal Aunt   . Diabetes Maternal Aunt   . Heart disease Maternal Uncle   . Heart disease Other        Grandparents  . Irritable bowel syndrome Daughter     ROS:  Review of Systems: [y] = yes, [ ]  = no       General: Weight gain [ ] ; Weight loss [ ] ; Anorexia [ ] ; Fatigue [ ] ; Fever [ ] ; Chills [ ] ; Weakness [ ]     Cardiac: Chest pain/pressure [ ] ; Resting SOB [ ] ; Exertional SOB [ ] ; Orthopnea [ ] ; Pedal Edema [ ] ; Palpitations [ ] ; Syncope [ ] ; Presyncope [ ] ; Paroxysmal nocturnal dyspnea [ ]     Pulmonary: Cough [ ] ; Wheezing [ ] ; Hemoptysis [ ] ; Sputum [ ] ; Snoring [ ]     GI: Vomiting [ ] ; Dysphagia [ ] ; Melena [ ] ; Hematochezia [ ] ; Heartburn [ ] ; Abdominal pain [ ] ; Constipation [ ] ; Diarrhea [ ] ; BRBPR [ ]     GU: Hematuria [ ] ; Dysuria [ ] ; Nocturia [ ]   Vascular: Pain in  legs with walking [ ] ; Pain in feet with lying flat [ ] ; Non-healing sores [ ] ; Stroke [ ] ; TIA [ ] ; Slurred speech [ ] ;    Neuro: Headaches [ ] ; Vertigo [ ] ; Seizures [ ] ; Paresthesias [ ] ;Blurred vision [ ] ; Diplopia [ ] ; Vision changes [ ]     Ortho/Skin: Arthritis [ ] ;  Joint pain [ ] ; Muscle pain [ ] ; Joint swelling [ ] ; Back Pain [ ] ; Rash [ ]     Psych: Depression [ ] ; Anxiety [ ]     Heme: Bleeding problems [ ] ; Clotting disorders [ ] ; Anemia [ ]     Endocrine: Diabetes [ ] ; Thyroid dysfunction [ ]    Physical Exam/Data:   Vitals:   12/07/20 0610 12/07/20 0625 12/07/20 0640 12/07/20 0655  BP: (!) 82/47 (!) 109/55 (!) 111/52 (!) 107/53  Pulse: 80 79 77 72  Resp: (!) 26 (!) 22 (!) 23 20  Temp:      TempSrc:      SpO2: 97% 95% 96% 95%  Weight:        Intake/Output Summary (Last 24 hours) at 12/07/2020 0727 Last data filed at 12/07/2020 0605 Gross per 24 hour  Intake 1909.48 ml  Output 1350 ml  Net 559.48 ml   Last 3 Weights 12/06/2020 11/07/2020 07/19/2020  Weight (lbs) 184 lb 8.4 oz 184 lb 9.6 oz 179 lb  Weight (kg) 83.7 kg 83.734 kg 81.194 kg     Body mass index is 30.71 kg/m.  General:  Well nourished, well developed, in no acute distress, A&O to person and place but not date/time  HEENT: normal Lymph: no adenopathy Neck: no JVD Endocrine:  No thryomegaly Vascular: No carotid bruits; FA pulses 2+ bilaterally without bruits  Cardiac:  normal S1, S2; accelerated rate, irregular rhythm  Lungs:  clear to auscultation bilaterally, no wheezing, rhonchi or rales  Abd: soft, nontender, no hepatomegaly  Ext: no edema Musculoskeletal:  No deformities, BUE and BLE strength normal and equal Skin: warm and dry, ecchymosis over RLQ  Neuro:  CNs 2-12 intact, no focal abnormalities noted Psych:  Normal affect   EKG:  The EKG was personally reviewed and demonstrates: AF/RVR Telemetry:  Telemetry was personally reviewed and demonstrates: AF/RVR with HR 130-150  Relevant CV Studies: Coronary CT 09/06/2016 Aortic Valve: Trileaflet. No calcifications. Coronary Arteries: Normal coronary origin. Right dominance. RCA is a large dominant artery that gives rise to PDA and PLVB. There is minimal diffuse calcified plaque in the proximal and mid RCA with  stenosis 0-25%. No plaque in PDA and PLA. Left main is a very large and long artery that gives rise to LAD and LCX arteries. There is no plaque. LAD is a large vessel that gives rise to one diagonal artery. There is mild calcified plaque in the mid LAD with associated stenosis 25-50%. There is another minimal calcified plaque in the mid LAD with stenosis 0-25%. No plaque in the diagonal branch. LCX is a non-dominant artery that gives rise to one large OM1 branch. There is no plaque. Normal pulmonary vein drainage into the left atrium. Normal let atrial appendage without a thrombus. There is mitral annular calcification. IMPRESSION: 1. Coronary calcium score of 90. 2. Normal coronary origin with right dominance. 3. There is mild diffuse non-obstructive plaque in RCA and LAD. An aggressive risk factor modification is recommended.  Laboratory Data:  High Sensitivity Troponin:  No results for input(s): TROPONINIHS in the last 720 hours.   Chemistry Recent Labs  Lab 12/05/20 [phone]  12/06/20 0256 12/06/20 2124  NA 135 130* 128*  K 4.0 4.5 3.6  CL 99 94* 99  CO2 27 26 23   GLUCOSE 305* 404* 341*  BUN 20 36* 47*  CREATININE 1.14* 1.93* 1.50*  CALCIUM 9.1 8.0* 7.5*  GFRNONAA 52* 28* 37*  ANIONGAP 9 10 6     Recent Labs  Lab 12/04/20 0549 12/06/20 2124  PROT 6.9 4.5*  ALBUMIN 3.8 2.1*  AST 32 29  ALT 19 9  ALKPHOS 130* 57  BILITOT 0.8 0.6   Hematology Recent Labs  Lab 12/06/20 0256 12/06/20 1709 12/06/20 2124  WBC 13.0* 9.3 9.5  RBC 3.04* 2.67* 2.44*  HGB 8.1* 7.0* 6.4*  HCT 25.9* 22.3* 20.1*  MCV 85.2 83.5 82.4  MCH 26.6 26.2 26.2  MCHC 31.3 31.4 31.8  RDW 15.3 15.3 15.2  PLT 185 154 142*   BNP Recent Labs  Lab 12/06/20 2124  BNP 220.0*    DDimer No results for input(s): DDIMER in the last 168 hours.  Radiology/Studies:  DG Chest 1 View  Result Date: 12/04/2020 CLINICAL DATA:  Fall EXAM: CHEST  1 VIEW COMPARISON:  05/25/2014 FINDINGS: Prominent heart  size without heart failure. Lungs well aerated and clear. No infiltrate or effusion. Atherosclerotic aorta. ACDF cervical spine IMPRESSION: No active disease. Electronically Signed   By: Franchot Gallo M.D.   On: 12/04/2020 07:58   DG Lumbar Spine Complete  Result Date: 12/04/2020 CLINICAL DATA:  Fall EXAM: LUMBAR SPINE - COMPLETE 4+ VIEW COMPARISON:  None. FINDINGS: Negative for lumbar fracture Normal alignment. Mild scoliosis. Mild to moderate disc degeneration at L3-4. Atherosclerotic aorta. IMPRESSION: Negative for fracture. Electronically Signed   By: Franchot Gallo M.D.   On: 12/04/2020 07:56   DG Pelvis 1-2 Views  Result Date: 12/04/2020 CLINICAL DATA:  Fall EXAM: PELVIS - 1-2 VIEW COMPARISON:  None. FINDINGS: Subcapital fracture left femur with impaction. No significant displacement. Left hip joint normal. No other pelvic fracture identified. IMPRESSION: Subcapital fracture left femur Electronically Signed   By: Franchot Gallo M.D.   On: 12/04/2020 07:55   DG Tibia/Fibula Left  Result Date: 12/04/2020 CLINICAL DATA:  Fall EXAM: LEFT TIBIA AND FIBULA - 2 VIEW COMPARISON:  None. FINDINGS: There is no evidence of fracture or other focal bone lesions. Soft tissues are unremarkable. IMPRESSION: Negative. Electronically Signed   By: Franchot Gallo M.D.   On: 12/04/2020 07:55   CT HEAD WO CONTRAST  Result Date: 12/06/2020 CLINICAL DATA:  Change in mental status. EXAM: CT HEAD WITHOUT CONTRAST TECHNIQUE: Contiguous axial images were obtained from the base of the skull through the vertex without intravenous contrast. COMPARISON:  None. FINDINGS: Brain: Normal for age atrophy. No intracranial hemorrhage, mass effect, or midline shift. No hydrocephalus. The basilar cisterns are patent. No evidence of territorial infarct or acute ischemia. No extra-axial or intracranial fluid collection. Vascular: Atherosclerosis of skullbase vasculature without hyperdense vessel or abnormal calcification. Skull: No  fracture or focal lesion. Sinuses/Orbits: Paranasal sinuses and mastoid air cells are clear. The visualized orbits are unremarkable. Other: None. IMPRESSION: Unremarkable noncontrast head CT for age. Electronically Signed   By: Keith Rake M.D.   On: 12/06/2020 23:28   Pelvis Portable  Result Date: 12/05/2020 CLINICAL DATA:  Postop. EXAM: PORTABLE PELVIS 1-2 VIEWS COMPARISON:  Preoperative radiograph yesterday. FINDINGS: Left hip arthroplasty in expected alignment. No periprosthetic lucency or fracture. Recent postsurgical change includes air and edema in the soft tissues. Lateral skin staples. IMPRESSION: Left hip arthroplasty without immediate postoperative  complication. Electronically Signed   By: Keith Rake M.D.   On: 12/05/2020 16:09   DG CHEST PORT 1 VIEW  Result Date: 12/06/2020 CLINICAL DATA:  Altered mental status EXAM: PORTABLE CHEST 1 VIEW COMPARISON:  December 04, 2020 FINDINGS: Cardiac enlargement with central vascular prominence. Aortic atherosclerosis. Low lung volumes with bibasilar atelectasis. No overt pulmonary edema. Partially visualized anterior spinal fusion hardware. IMPRESSION: Cardiac enlargement with central vascular prominence. Electronically Signed   By: Dahlia Bailiff MD   On: 12/06/2020 21:48   DG Knee Complete 4 Views Right  Result Date: 12/04/2020 CLINICAL DATA:  Fall EXAM: RIGHT KNEE - COMPLETE 4+ VIEW COMPARISON:  None. FINDINGS: No evidence of fracture, dislocation, or joint effusion. No evidence of arthropathy or other focal bone abnormality. Soft tissues are unremarkable. IMPRESSION: Negative. Electronically Signed   By: Franchot Gallo M.D.   On: 12/04/2020 07:57   DG Foot Complete Right  Result Date: 12/04/2020 CLINICAL DATA:  Fall EXAM: RIGHT FOOT COMPLETE - 3+ VIEW COMPARISON:  None. FINDINGS: Negative for foot fracture Hallux valgus deformity.  Otherwise no arthropathy. IMPRESSION: Negative for fracture. Electronically Signed   By: Franchot Gallo M.D.    On: 12/04/2020 07:57   DG C-Arm 1-60 Min  Result Date: 12/05/2020 CLINICAL DATA:  Elective surgery. Additional history provided: Total left hip arthroplasty with anterior approach. Provided fluoroscopy time: 12 seconds (2.2132 mGy). EXAM: OPERATIVE left HIP (WITH PELVIS IF PERFORMED) 2 VIEWS TECHNIQUE: Fluoroscopic spot image(s) were submitted for interpretation post-operatively. COMPARISON:  Radiographs of the left femur and pelvis 12/04/2020. FINDINGS: Two intraoperative fluoroscopic images of the left hip are submitted. On the provided images, there is evidence of interval left total hip arthroplasty. The femoral and acetabular components appear well seated. No unexpected finding on the provided views. IMPRESSION: Two intraoperative fluoroscopic images from left total hip arthroplasty, as described. Electronically Signed   By: Kellie Simmering DO   On: 12/05/2020 14:55   DG HIP OPERATIVE UNILAT W OR W/O PELVIS LEFT  Result Date: 12/05/2020 CLINICAL DATA:  Elective surgery. Additional history provided: Total left hip arthroplasty with anterior approach. Provided fluoroscopy time: 12 seconds (2.2132 mGy). EXAM: OPERATIVE left HIP (WITH PELVIS IF PERFORMED) 2 VIEWS TECHNIQUE: Fluoroscopic spot image(s) were submitted for interpretation post-operatively. COMPARISON:  Radiographs of the left femur and pelvis 12/04/2020. FINDINGS: Two intraoperative fluoroscopic images of the left hip are submitted. On the provided images, there is evidence of interval left total hip arthroplasty. The femoral and acetabular components appear well seated. No unexpected finding on the provided views. IMPRESSION: Two intraoperative fluoroscopic images from left total hip arthroplasty, as described. Electronically Signed   By: Kellie Simmering DO   On: 12/05/2020 14:55   DG Femur Min 2 Views Left  Result Date: 12/04/2020 CLINICAL DATA:  Fall EXAM: LEFT FEMUR 2 VIEWS COMPARISON:  None. FINDINGS: Subcapital fracture left femur with mild  impaction. Left hip joint normal. No other femur fracture. IMPRESSION: Subcapital fracture left femur Electronically Signed   By: Franchot Gallo M.D.   On: 12/04/2020 07:54   Assessment and Plan:   1. AF/RVR Ms. Korst has new onset AF/RVR post left THA in the setting of significant postop anemia without obvious source with a hemoglobin of 6.4 from baseline 12-13.  She also has moderate OSA along with obesity. Her C2V is (5) however she is significantly anemic and until Hb stabilizes would not recommend [place]. She did receive amio 150 mg IV x2 doses. Her BP is 100/60s and  her toprol XL was d/c 2 days ago on 04/26.  I would recommend resuming fractionated metoprolol to try and rate control her.  I would start at 25 mg PO q6h and if rates are still not well controlled by this evening increase her dose to 37.5 mg PO q6h and to 50 mg PO q6h as tolerated.  Ideally I would like to get her back to her prior home dose of Toprol-XL 100 mg p.o. twice daily.  I would try to rate control her first before giving additional amiodarone to see if we can avoid committing her to an additional medication. TTE is ordered to reassess EF. Once stable from a surgical standpoint and Hb is stable/improving then consider [place] before discharge. I added on a TSH in case we need to continue amio load if she is not optimally rate controlled. LFTs are WNLs.  I would not consider DCCV at this time given she is post op and profoundly anemic unless she was HD unstable. She received 2 units PRBCs this AM.   For questions or updates, please contact [facility] Please consult [email or web address] for contact info under   Signed, Dion Body, MD  12/07/2020 7:27 AM

## 2020-12-07 NOTE — Progress Notes (Addendum)
Paged MD Olevia Bowens about patient jumping into afib with RVR. B/p 118/62, HR 140s sustained   0530- Paged MD Olevia Bowens after amiodarone bolus given. Patient b/p 87/64, HR 120s-130s sustaining at a fib with RVR. EKG strip ran and on chart. Awaiting call back.   0540- cardiology at bedside talking with patient. Patient able to tell him that is she is located at [facility], the hospital. Unable to name situation. Falls asleep mid conversation. Disoriented to month and date. B/p currently- 100/77  HR 130- afib with rvr.

## 2020-12-08 ENCOUNTER — Telehealth: Payer: Self-pay | Admitting: Internal Medicine

## 2020-12-08 ENCOUNTER — Inpatient Hospital Stay (HOSPITAL_COMMUNITY): Payer: Medicare Other

## 2020-12-08 DIAGNOSIS — S72012A Unspecified intracapsular fracture of left femur, initial encounter for closed fracture: Secondary | ICD-10-CM | POA: Diagnosis not present

## 2020-12-08 DIAGNOSIS — G4733 Obstructive sleep apnea (adult) (pediatric): Secondary | ICD-10-CM

## 2020-12-08 DIAGNOSIS — G9341 Metabolic encephalopathy: Secondary | ICD-10-CM | POA: Diagnosis not present

## 2020-12-08 DIAGNOSIS — I480 Paroxysmal atrial fibrillation: Secondary | ICD-10-CM | POA: Diagnosis not present

## 2020-12-08 DIAGNOSIS — F419 Anxiety disorder, unspecified: Secondary | ICD-10-CM | POA: Diagnosis not present

## 2020-12-08 DIAGNOSIS — R2231 Localized swelling, mass and lump, right upper limb: Secondary | ICD-10-CM | POA: Diagnosis not present

## 2020-12-08 DIAGNOSIS — D62 Acute posthemorrhagic anemia: Secondary | ICD-10-CM | POA: Diagnosis not present

## 2020-12-08 LAB — TYPE AND SCREEN
ABO/RH(D): O POS
Antibody Screen: NEGATIVE
Unit division: 0
Unit division: 0

## 2020-12-08 LAB — BPAM RBC
Blood Product Expiration Date: 202205232359
Blood Product Expiration Date: 202205232359
ISSUE DATE / TIME: 202204280024
ISSUE DATE / TIME: 202204280352
Unit Type and Rh: 5100
Unit Type and Rh: 5100

## 2020-12-08 LAB — CBC
HCT: 28.5 % — ABNORMAL LOW (ref 36.0–46.0)
Hemoglobin: 9.7 g/dL — ABNORMAL LOW (ref 12.0–15.0)
MCH: 28.3 pg (ref 26.0–34.0)
MCHC: 34 g/dL (ref 30.0–36.0)
MCV: 83.1 fL (ref 80.0–100.0)
Platelets: 154 10*3/uL (ref 150–400)
RBC: 3.43 MIL/uL — ABNORMAL LOW (ref 3.87–5.11)
RDW: 16.3 % — ABNORMAL HIGH (ref 11.5–15.5)
WBC: 10 10*3/uL (ref 4.0–10.5)
nRBC: 0 % (ref 0.0–0.2)

## 2020-12-08 LAB — MAGNESIUM: Magnesium: 2.1 mg/dL (ref 1.7–2.4)

## 2020-12-08 LAB — BASIC METABOLIC PANEL
Anion gap: 8 (ref 5–15)
BUN: 21 mg/dL (ref 8–23)
CO2: 22 mmol/L (ref 22–32)
Calcium: 8.2 mg/dL — ABNORMAL LOW (ref 8.9–10.3)
Chloride: 101 mmol/L (ref 98–111)
Creatinine, Ser: 0.73 mg/dL (ref 0.44–1.00)
GFR, Estimated: >60 mL/min (ref >60)
Glucose, Bld: 310 mg/dL — ABNORMAL HIGH (ref 70–99)
Potassium: 3.5 mmol/L (ref 3.5–5.1)
Sodium: 131 mmol/L — ABNORMAL LOW (ref 135–145)

## 2020-12-08 LAB — URINE CULTURE: Culture: NO GROWTH

## 2020-12-08 LAB — GLUCOSE, CAPILLARY
Glucose-Capillary: 282 mg/dL — ABNORMAL HIGH (ref 70–99)
Glucose-Capillary: 342 mg/dL — ABNORMAL HIGH (ref 70–99)
Glucose-Capillary: 201 mg/dL — ABNORMAL HIGH (ref 70–99)
Glucose-Capillary: 261 mg/dL — ABNORMAL HIGH (ref 70–99)

## 2020-12-08 MED ORDER — MIDODRINE HCL 5 MG PO TABS
5.0000 mg | ORAL_TABLET | Freq: Two times a day (BID) | ORAL | Status: DC
  Administered 2020-12-08 – 2020-12-10 (×4): 5 mg via ORAL
  Filled 2020-12-08 (×4): qty 1

## 2020-12-08 MED ORDER — ENOXAPARIN SODIUM 40 MG/0.4ML IJ SOSY
40.0000 mg | PREFILLED_SYRINGE | INTRAMUSCULAR | 0 refills | Status: DC
Start: 2020-12-08 — End: 2020-12-12

## 2020-12-08 MED ORDER — METOPROLOL TARTRATE 5 MG/5ML IV SOLN
5.0000 mg | Freq: Once | INTRAVENOUS | Status: AC
  Administered 2020-12-08: 5 mg via INTRAVENOUS
  Filled 2020-12-08: qty 5

## 2020-12-08 MED ORDER — POLYETHYLENE GLYCOL 3350 17 G PO PACK
17.0000 g | PACK | Freq: Two times a day (BID) | ORAL | Status: DC
  Administered 2020-12-08 – 2020-12-09 (×3): 17 g via ORAL
  Filled 2020-12-08 (×3): qty 1

## 2020-12-08 MED ORDER — APIXABAN 5 MG PO TABS
5.0000 mg | ORAL_TABLET | Freq: Two times a day (BID) | ORAL | Status: DC
  Administered 2020-12-08 – 2020-12-12 (×9): 5 mg via ORAL
  Filled 2020-12-08 (×9): qty 1

## 2020-12-08 MED ORDER — AMIODARONE HCL IN DEXTROSE 360-4.14 MG/200ML-% IV SOLN
30.0000 mg/h | INTRAVENOUS | Status: DC
  Administered 2020-12-09 – 2020-12-10 (×3): 30 mg/h via INTRAVENOUS
  Filled 2020-12-08 (×4): qty 200

## 2020-12-08 MED ORDER — AMIODARONE HCL IN DEXTROSE 360-4.14 MG/200ML-% IV SOLN
60.0000 mg/h | INTRAVENOUS | Status: AC
  Administered 2020-12-08: 60 mg/h via INTRAVENOUS
  Filled 2020-12-08: qty 200

## 2020-12-08 MED ORDER — AMLODIPINE BESYLATE 5 MG PO TABS: 5.0000 mg | ORAL_TABLET | Freq: Every day | ORAL | Status: DC

## 2020-12-08 MED ORDER — AMIODARONE LOAD VIA INFUSION
150.0000 mg | Freq: Once | INTRAVENOUS | Status: AC
  Administered 2020-12-08: 150 mg via INTRAVENOUS
  Filled 2020-12-08: qty 83.34

## 2020-12-08 MED ORDER — INSULIN GLARGINE 100 UNIT/ML ~~LOC~~ SOLN
5.0000 [IU] | Freq: Once | SUBCUTANEOUS | Status: DC
  Filled 2020-12-08: qty 0.05

## 2020-12-08 MED ORDER — AMLODIPINE BESYLATE 2.5 MG PO TABS: 2.5000 mg | ORAL_TABLET | Freq: Every day | ORAL | Status: DC

## 2020-12-08 MED ORDER — METOPROLOL SUCCINATE ER 100 MG PO TB24
100.0000 mg | ORAL_TABLET | Freq: Every day | ORAL | Status: DC
  Administered 2020-12-08 – 2020-12-12 (×5): 100 mg via ORAL
  Filled 2020-12-08 (×5): qty 1

## 2020-12-08 MED ORDER — INSULIN GLARGINE 100 UNIT/ML ~~LOC~~ SOLN
35.0000 [IU] | Freq: Every day | SUBCUTANEOUS | Status: DC
  Administered 2020-12-09 – 2020-12-12 (×4): 35 [IU] via SUBCUTANEOUS
  Filled 2020-12-08 (×5): qty 0.35

## 2020-12-08 NOTE — Discharge Instructions (Signed)
Dr. Rod Can Joint Replacement Specialist [facility] [address] [place], [place] [address] [phone]   TOTAL HIP REPLACEMENT POSTOPERATIVE DIRECTIONS    Hip Rehabilitation, Guidelines Following Surgery   WEIGHT BEARING Weight bearing as tolerated with assist device (walker, cane, etc) as directed, use it as long as suggested by your surgeon or therapist, typically at least 4-6 weeks.  The results of a hip operation are greatly improved after range of motion and muscle strengthening exercises. Follow all safety measures which are given to protect your hip. If any of these exercises cause increased pain or swelling in your joint, decrease the amount until you are comfortable again. Then slowly increase the exercises. Call your caregiver if you have problems or questions.   HOME CARE INSTRUCTIONS  Most of the following instructions are designed to prevent the dislocation of your new hip.  Remove items at home which could result in a fall. This includes throw rugs or furniture in walking pathways.  Continue medications as instructed at time of discharge.  You may have some home medications which will be placed on hold until you complete the course of blood thinner medication.  You may start showering once you are discharged home. Do not remove your dressing. Do not put on socks or shoes without following the instructions of your caregivers.   Sit on chairs with arms. Use the chair arms to help push yourself up when arising.  Arrange for the use of a toilet seat elevator so you are not sitting low.   Walk with walker as instructed.  You may resume a sexual relationship in one month or when given the OK by your caregiver.  Use walker as long as suggested by your caregivers.  You may put full weight on your legs and walk as much as is comfortable. Avoid periods of inactivity such as sitting longer than an hour when not asleep. This helps prevent  blood clots.  You may return to work once you are cleared by [employer], production.  Do not drive a car for 6 weeks or until released by your surgeon.  Do not drive while taking narcotics.  Wear elastic stockings for two weeks following surgery during the day but you may remove then at night.  Make sure you keep all of your appointments after your operation with all of your doctors and caregivers. You should call the office at the above phone number and make an appointment for approximately two weeks after the date of your surgery. Please pick up a stool softener and laxative for home use as long as you are requiring pain medications.  ICE to the affected hip every three hours for 30 minutes at a time and then as needed for pain and swelling. Continue to use ice on the hip for pain and swelling from surgery. You may notice swelling that will progress down to the foot and ankle.  This is normal after surgery.  Elevate the leg when you are not up walking on it.   It is important for you to complete the blood thinner medication as prescribed by your doctor.  Continue to use the breathing machine which will help keep your temperature down.  It is common for your temperature to cycle up and down following surgery, especially at night when you are not up moving around and exerting yourself.  The breathing machine keeps your lungs expanded and your temperature down.  RANGE OF MOTION AND STRENGTHENING EXERCISES  These exercises  are designed to help you keep full movement of your hip joint. Follow your caregiver's or physical therapist's instructions. Perform all exercises about fifteen times, three times per day or as directed. Exercise both hips, even if you have had only one joint replacement. These exercises can be done on a training (exercise) mat, on the floor, on a table or on a bed. Use whatever works the best and is most comfortable for you. Use music or television while you are exercising so that the exercises  are a pleasant break in your day. This will make your life better with the exercises acting as a break in routine you can look forward to.  Lying on your back, slowly slide your foot toward your buttocks, raising your knee up off the floor. Then slowly slide your foot back down until your leg is straight again.  Lying on your back spread your legs as far apart as you can without causing discomfort.  Lying on your side, raise your upper leg and foot straight up from the floor as far as is comfortable. Slowly lower the leg and repeat.  Lying on your back, tighten up the muscle in the front of your thigh (quadriceps muscles). You can do this by keeping your leg straight and trying to raise your heel off the floor. This helps strengthen the largest muscle supporting your knee.  Lying on your back, tighten up the muscles of your buttocks both with the legs straight and with the knee bent at a comfortable angle while keeping your heel on the floor.   SKILLED REHAB INSTRUCTIONS: If the patient is transferred to a skilled rehab facility following release from the hospital, a list of the current medications will be sent to the facility for the patient to continue.  When discharged from the skilled rehab facility, please have the facility set up the patient's [place] prior to being released. Also, the skilled facility will be responsible for providing the patient with their medications at time of release from the facility to include their pain medication and their blood thinner medication. If the patient is still at the rehab facility at time of the two week follow up appointment, the skilled rehab facility will also need to assist the patient in arranging follow up appointment in our office and any transportation needs.  POST-OPERATIVE OPIOID TAPER INSTRUCTIONS: . It is important to wean off of your opioid medication as soon as possible. If you do not need pain medication after your surgery it  is ok to stop day one. Marland Kitchen Opioids include: o Codeine, Hydrocodone(Norco, Vicodin), Oxycodone(Percocet, oxycontin) and hydromorphone amongst others.  . Long term and even short term use of opiods can cause: o Increased pain response o Dependence o Constipation o Depression o Respiratory depression o And more.  . Withdrawal symptoms can include o Flu like symptoms o Nausea, vomiting o And more . Techniques to manage these symptoms o Hydrate well o Eat regular healthy meals o Stay active o Use relaxation techniques(deep breathing, meditating, yoga) . Do Not substitute Alcohol to help with tapering . If you have been on opioids for less than two weeks and do not have pain than it is ok to stop all together.  . Plan to wean off of opioids o This plan should start within one week post op of your joint replacement. o Maintain the same interval or time between taking each dose and first decrease the dose.  o Cut the total daily intake  of opioids by one tablet each day o Next start to increase the time between doses. o The last dose that should be eliminated is the evening dose.      MAKE SURE YOU:  Understand these instructions.  Will watch your condition.  Will get help right away if you are not doing well or get worse.  Pick up stool softner and laxative for home use following surgery while on pain medications. Do not remove your dressing. The dressing is waterproof--it is OK to take showers. Continue to use ice for pain and swelling after surgery. Do not use any lotions or creams on the incision until instructed by your surgeon. Total Hip Protocol.    Information on my medicine - ELIQUIS (apixaban)  This medication education was reviewed with me or my healthcare representative as part of my discharge preparation.    Why was Eliquis prescribed for you? Eliquis was prescribed for you to reduce the risk of a blood clot forming that can cause a stroke if you have a medical  condition called atrial fibrillation (a type of irregular heartbeat).  What do You need to know about Eliquis ? Take your Eliquis TWICE DAILY - one tablet in the morning and one tablet in the evening with or without food. If you have difficulty swallowing the tablet whole please discuss with your pharmacist how to take the medication safely.  Take Eliquis exactly as prescribed by your doctor and DO NOT stop taking Eliquis without talking to the doctor who prescribed the medication.  Stopping may increase your risk of developing a stroke.  Refill your prescription before you run out.  After discharge, you should have regular check-up appointments with your healthcare provider that is prescribing your Eliquis.  In the future your dose may need to be changed if your kidney function or weight changes by a significant amount or as you get older.  What do you do if you miss a dose? If you miss a dose, take it as soon as you remember on the same day and resume taking twice daily.  Do not take more than one dose of ELIQUIS at the same time to make up a missed dose.  Important Safety Information A possible side effect of Eliquis is bleeding. You should call your healthcare provider right away if you experience any of the following: ? Bleeding from an injury or your nose that does not stop. ? Unusual colored urine (red or dark brown) or unusual colored stools (red or black). ? Unusual bruising for unknown reasons. ? A serious fall or if you hit your head (even if there is no bleeding).  Some medicines may interact with Eliquis and might increase your risk of bleeding or clotting while on Eliquis. To help avoid this, consult your healthcare provider or pharmacist prior to using any new prescription or non-prescription medications, including herbals, vitamins, non-steroidal anti-inflammatory drugs (NSAIDs) and supplements.  This website has more information on Eliquis (apixaban):  http://www.eliquis.com/eliquis/home

## 2020-12-08 NOTE — Telephone Encounter (Signed)
Back in afib and rvr. HR is 39-40. Please advise contact meka

## 2020-12-08 NOTE — Telephone Encounter (Signed)
Sent to MD as Juluis Rainier - patient admitted

## 2020-12-08 NOTE — Progress Notes (Signed)
Physical Therapy Treatment Patient Details Name: Alexis [name] MRN: [id number] DOB: 30-Dec-1950 Today's Date: 12/08/2020    History of Present Illness Pt is a 70 yo female who fell and sustained a L hip fracture who then underwent a L THA, anterior approach. PMH: depression, DM II, anxiety PSH: ACDF 04/2018    PT Comments    Pt with significant improvement in alertness and awareness this session, and was able to demo improved movement of LLE with bed-level exercises. Pt with improved independence with transition to sitting EOB, requires increased time, but minA only to LLE as well as verbal cues to complete. The session was then limited by onset of sustained tachycardia in 130s-140s with bouts of afib and SVT noted on telemetry. RN was alerted by tele, and asked PT to hold with mobility at this time. The pt remained asymptomatic and in no acute distress while sitting EOB, but does report some fatigue. Pt returned to supine at this time, will continue to progress with PT POC and mobility in later sessions, continue to recommend post-acute rehab.    Follow Up Recommendations  SNF;Supervision/Assistance - 24 hour     Equipment Recommendations  Rolling walker with 5" wheels    Recommendations for Other Services       Precautions / Restrictions Precautions Precautions: Fall Precaution Comments: watch HR Restrictions Weight Bearing Restrictions: Yes LLE Weight Bearing: Weight bearing as tolerated    Mobility  Bed Mobility Overal bed mobility: Needs Assistance Bed Mobility: Rolling;Supine to Sit;Sit to Sidelying Rolling: Min assist   Supine to sit: Mod assist;HOB elevated   Sit to sidelying: Min assist General bed mobility comments: assist to move LLE to EOB, then minA with pt using elevated HOB and BUE on bed rail. modA to complete transition to sitting    Transfers Overall transfer level: Needs assistance Equipment used: Rolling walker (2 wheeled) Transfers: Sit to/from  Stand Sit to Stand: Mod assist;+2 physical assistance         General transfer comment: modA of 2with slow power up, posterior lean with initial stand  Ambulation/Gait Ambulation/Gait assistance: Max assist Gait Distance (Feet): 2 Feet Assistive device: Rolling walker (2 wheeled) Gait Pattern/deviations: Step-to pattern;Decreased weight shift to left;Decreased stance time - left;Decreased step length - right;Decreased stride length;Shuffle Gait velocity: decreased   General Gait Details: small lateral steps to [facility] with modA to RW, assist to move RW laterally, and assist to facilitate wt shift. decreased stride lengths       Balance Overall balance assessment: Needs assistance Sitting-balance support: No upper extremity supported;Feet supported Sitting balance-Leahy Scale: Fair Sitting balance - Comments: static sitting without UE support   Standing balance support: Bilateral upper extremity supported Standing balance-Leahy Scale: Poor Standing balance comment: BUE support on RW                            Cognition Arousal/Alertness: Awake/alert Behavior During Therapy: WFL for tasks assessed/performed;Flat affect Overall Cognitive Status: Impaired/Different from baseline Area of Impairment: Orientation;Attention;Memory;Following commands;Safety/judgement;Awareness;Problem solving                 Orientation Level: Disoriented to;Time Current Attention Level: Focused Memory: Decreased short-term memory;Decreased recall of precautions Following Commands: Follows one step commands with increased time;Follows one step commands inconsistently Safety/Judgement: Decreased awareness of safety;Decreased awareness of deficits Awareness: Intellectual Problem Solving: Slow processing;Decreased initiation;Difficulty sequencing;Requires verbal cues;Requires tactile cues General Comments: pt able to answer all questions, making appropriate jokes with  daughter. but  needing increased cues for bed mobility, positioning, sequencing. improved awareness, but no recognition of therapist from day before      Exercises General Exercises - Lower Extremity Heel Slides: AROM;Left;10 reps;Supine Heel Raises: AROM;Both;20 reps;Seated    General Comments General comments (skin integrity, edema, etc.): swelling to L thigh and knee. HR increased to sustained 130s-140s while sitting EOB, between afib and SVT. RN alerted by tele, asked PT to discontinue mobility at this time. pt reports no acute distress      Pertinent Vitals/Pain Pain Assessment: Faces Faces Pain Scale: Hurts little more Pain Location: L hip with wt bearing Pain Descriptors / Indicators: Discomfort;Grimacing Pain Intervention(s): Limited activity within patient's tolerance;Monitored during session;Repositioned           PT Goals (current goals can now be found in the care plan section) Acute Rehab PT Goals Patient Stated Goal: to walk in the hall PT Goal Formulation: With patient/family Time For Goal Achievement: 12/20/20 Potential to Achieve Goals: Good Progress towards PT goals: Progressing toward goals    Frequency    Min 3X/week      PT Plan Current plan remains appropriate       AM-PAC PT "6 Clicks" Mobility   Outcome Measure  Help needed turning from your back to your side while in a flat bed without using bedrails?: A Lot Help needed moving from lying on your back to sitting on the side of a flat bed without using bedrails?: A Lot Help needed moving to and from a bed to a chair (including a wheelchair)?: A Lot Help needed standing up from a chair using your arms (e.g., wheelchair or bedside chair)?: A Lot Help needed to walk in hospital room?: A Lot Help needed climbing 3-5 steps with a railing? : A Lot 6 Click Score: 12    End of Session Equipment Utilized During Treatment: Gait belt Activity Tolerance: Treatment limited secondary to medical complications (Comment)  (sustained HR 130-140s at rest) Patient left: in bed;with call bell/phone within reach;with bed alarm set;with family/visitor present Nurse Communication: Mobility status PT Visit Diagnosis: Unsteadiness on feet (R26.81);Muscle weakness (generalized) (M62.81);Difficulty in walking, not elsewhere classified (R26.2)     Time: 2993-7169 PT Time Calculation (min) (ACUTE ONLY): 36 min  Charges:  $Therapeutic Exercise: 8-22 mins $Therapeutic Activity: 8-22 mins                     Karma Ganja, PT, DPT   Acute Rehabilitation Department Pager #: [phone]   Otho Bellows 12/08/2020, 5:10 PM

## 2020-12-08 NOTE — Progress Notes (Signed)
PROGRESS NOTE    Alexis [name]  [id number] DOB: 1951-04-14 DOA: 12/04/2020 PCP: Deland Pretty, MD    Brief Narrative:  Patient is a 70 year old female with history of hypertension, hyperlipidemia, coronary artery disease, diabetes type 2 with peripheral neuropathy, uterine cancer status post hysterectomy, OSA  from home after falling at home.  No history of loss of consciousness or head injury.  After the fall ,she immediately developed pain on the right hip and was unable to bear weight.  On presentation, she was hypertensive, x-ray showed subcapital fracture of the left femur.  Orthopedics consulted and plan is for ORIF  4/28-overnight issues were noted.  Early this morning patient was in atrial fibrillation, status post 1 unit blood transfusion.  Cardiology was consulted.  4/29-pt appears better than other days. Family concerned pt appears to be confused at times. During my exam, answers more appropriately and oriented, which family does agree pt is better than previous days. Hold off on Mri brain for now which family is agreeable with this plan.   Consultants:   orthopedics  Procedures:   Antimicrobials:    vanco, cefep, meto 4/27   Subjective: No cp, sob, abd pain.no bm   This SmartLink has not been configured with any valid records.    Objective: Vitals:   12/08/20 0312 12/08/20 0613 12/08/20 0835 12/08/20 1205  BP: (!) 123/56 (!) 129/57 (!) 148/60 140/66  Pulse: 89 100 91 87  Resp: 20  (!) 21 (!) 25  Temp: 98.1 F (36.7 C)  99.6 F (37.6 C) 100 F (37.8 C)  TempSrc: Oral  Oral Axillary  SpO2: 92%  91% 92%  Weight:        Intake/Output Summary (Last 24 hours) at 12/08/2020 1315 Last data filed at 12/08/2020 1030 Gross per 24 hour  Intake 390 ml  Output 2550 ml  Net -2160 ml   Filed Weights   12/06/20 1400  Weight: 83.7 kg    Examination: Nad, calm, sleepy mildy cta with decrease bs  Regular s1/s2 no gallop Soft benign +bs No edema Awake  oriented x3. Answers questions appropriatly . No facial droop Mood and affect appropriate in current setting   Data Reviewed: I have personally reviewed following labs and imaging studies  CBC: Recent Labs  Lab 12/04/20 0549 12/05/20 0416 12/06/20 0256 12/06/20 1709 12/06/20 2124 12/07/20 0826  WBC 10.7* 13.6* 13.0* 9.3 9.5 10.5  NEUTROABS 7.9*  --  10.5*  --  7.1  --   HGB 12.5 12.9 8.1* 7.0* 6.4* 9.5*  HCT 40.4 41.1 25.9* 22.3* 20.1* 28.8*  MCV 85.2 82.9 85.2 83.5 82.4 85.0  PLT 302 283 185 154 142* [id number]*   Basic Metabolic Panel: Recent Labs  Lab 12/04/20 0549 12/05/20 0416 12/06/20 0256 12/06/20 2124 12/07/20 0826  NA 137 135 130* 128* 131*  K 4.4 4.0 4.5 3.6 3.9  CL 101 99 94* 99 99  CO2 27 27 26 23 22   GLUCOSE 243* 305* 404* 341* 358*  BUN 14 20 36* 47* 41*  CREATININE 0.73 1.14* 1.93* 1.50* 1.22*  CALCIUM 9.0 9.1 8.0* 7.5* 7.9*  MG  --   --   --   --  1.7   GFR: Estimated Creatinine Clearance: 46.5 mL/min (A) (by C-G formula based on SCr of 1.22 mg/dL (H)). Liver Function Tests: Recent Labs  Lab 12/04/20 0549 12/06/20 2124  AST 32 29  ALT 19 9  ALKPHOS 130* 57  BILITOT 0.8 0.6  PROT 6.9 4.5*  ALBUMIN  3.8 2.1*   No results for input(s): LIPASE, AMYLASE in the last 168 hours. No results for input(s): AMMONIA in the last 168 hours. Coagulation Profile: No results for input(s): INR, PROTIME in the last 168 hours. Cardiac Enzymes: No results for input(s): CKTOTAL, CKMB, CKMBINDEX, TROPONINI in the last 168 hours. BNP (last 3 results) No results for input(s): PROBNP in the last 8760 hours. HbA1C: No results for input(s): HGBA1C in the last 72 hours. CBG: Recent Labs  Lab 12/07/20 1111 12/07/20 1827 12/07/20 2206 12/08/20 0834 12/08/20 1157  GLUCAP 317* 342* 280* 282* 342*   Lipid Profile: No results for input(s): CHOL, HDL, LDLCALC, TRIG, CHOLHDL, LDLDIRECT in the last 72 hours. Thyroid Function Tests: Recent Labs    12/07/20 0826  TSH  1.190   Anemia Panel: No results for input(s): VITAMINB12, FOLATE, FERRITIN, TIBC, IRON, RETICCTPCT in the last 72 hours. Sepsis Labs: Recent Labs  Lab 12/06/20 2124  LATICACIDVEN 1.4    Recent Results (from the past 240 hour(s))  Resp Panel by RT-PCR (Flu A&B, Covid) Nasopharyngeal Swab     Status: None   Collection Time: 12/04/20  8:13 AM   Specimen: Nasopharyngeal Swab; Nasopharyngeal(NP) swabs in vial transport medium  Result Value Ref Range Status   SARS Coronavirus 2 by RT PCR NEGATIVE NEGATIVE Final    Comment: (NOTE) SARS-CoV-2 target nucleic acids are NOT DETECTED.  The SARS-CoV-2 RNA is generally detectable in upper respiratory specimens during the acute phase of infection. The lowest concentration of SARS-CoV-2 viral copies this assay can detect is 138 copies/mL. A negative result does not preclude SARS-Cov-2 infection and should not be used as the sole basis for treatment or other patient management decisions. A negative result may occur with  improper specimen collection/handling, submission of specimen other than nasopharyngeal swab, presence of viral mutation(s) within the areas targeted by this assay, and inadequate number of viral copies(<138 copies/mL). A negative result must be combined with clinical observations, patient history, and epidemiological information. The expected result is Negative.  Fact Sheet for Patients:  EntrepreneurPulse.com.au  Fact Sheet for Healthcare Providers:  IncredibleEmployment.be  This test is no t yet approved or cleared by the [place] FDA and  has been authorized for detection and/or diagnosis of SARS-CoV-2 by FDA under an Emergency Use Authorization (EUA). This EUA will remain  in effect (meaning this test can be used) for the duration of the COVID-19 declaration under Section 564(b)(1) of the Act, 21 U.S.C.section 360bbb-3(b)(1), unless the authorization is terminated  or revoked  sooner.       Influenza A by PCR NEGATIVE NEGATIVE Final   Influenza B by PCR NEGATIVE NEGATIVE Final    Comment: (NOTE) The Xpert Xpress SARS-CoV-2/FLU/RSV plus assay is intended as an aid in the diagnosis of influenza from Nasopharyngeal swab specimens and should not be used as a sole basis for treatment. Nasal washings and aspirates are unacceptable for Xpert Xpress SARS-CoV-2/FLU/RSV testing.  Fact Sheet for Patients: EntrepreneurPulse.com.au  Fact Sheet for Healthcare Providers: IncredibleEmployment.be  This test is not yet approved or cleared by the [place] FDA and has been authorized for detection and/or diagnosis of SARS-CoV-2 by FDA under an Emergency Use Authorization (EUA). This EUA will remain in effect (meaning this test can be used) for the duration of the COVID-19 declaration under Section 564(b)(1) of the Act, 21 U.S.C. section 360bbb-3(b)(1), unless the authorization is terminated or revoked.  Performed at [facility], [place] [address] [place], [place] [address]  Culture, Urine     Status: None   Collection Time: 12/07/20 11:59 AM   Specimen: Urine, Random  Result Value Ref Range Status   Specimen Description URINE, RANDOM  Final   Special Requests NONE  Final   Culture   Final    NO GROWTH Performed at [facility], [address], [place], [place] [address]    Report Status 12/08/2020 FINAL  Final  Culture, blood (routine x 2)     Status: None (Preliminary result)   Collection Time: 12/07/20  1:17 PM   Specimen: BLOOD  Result Value Ref Range Status   Specimen Description BLOOD RIGHT ANTECUBITAL  Final   Special Requests   Final    BOTTLES DRAWN AEROBIC AND ANAEROBIC Blood Culture adequate volume   Culture   Final    NO GROWTH < 24 HOURS Performed at [facility], [place] [address], [place], [place] [address]    Report Status PENDING  Incomplete  Culture, blood (routine x 2)     Status:  None (Preliminary result)   Collection Time: 12/07/20  1:17 PM   Specimen: BLOOD  Result Value Ref Range Status   Specimen Description BLOOD BLOOD LEFT HAND  Final   Special Requests   Final    BOTTLES DRAWN AEROBIC AND ANAEROBIC Blood Culture adequate volume   Culture   Final    NO GROWTH < 24 HOURS Performed at [facility], [place] [address] [place], [place] [address]    Report Status PENDING  Incomplete         Radiology Studies: CT HEAD WO CONTRAST  Result Date: 12/06/2020 CLINICAL DATA:  Change in mental status. EXAM: CT HEAD WITHOUT CONTRAST TECHNIQUE: Contiguous axial images were obtained from the base of the skull through the vertex without intravenous contrast. COMPARISON:  None. FINDINGS: Brain: Normal for age atrophy. No intracranial hemorrhage, mass effect, or midline shift. No hydrocephalus. The basilar cisterns are patent. No evidence of territorial infarct or acute ischemia. No extra-axial or intracranial fluid collection. Vascular: Atherosclerosis of skullbase vasculature without hyperdense vessel or abnormal calcification. Skull: No fracture or focal lesion. Sinuses/Orbits: Paranasal sinuses and mastoid air cells are clear. The visualized orbits are unremarkable. Other: None. IMPRESSION: Unremarkable noncontrast head CT for age. Electronically Signed   By: Keith Rake M.D.   On: 12/06/2020 23:28   DG CHEST PORT 1 VIEW  Result Date: 12/06/2020 CLINICAL DATA:  Altered mental status EXAM: PORTABLE CHEST 1 VIEW COMPARISON:  December 04, 2020 FINDINGS: Cardiac enlargement with central vascular prominence. Aortic atherosclerosis. Low lung volumes with bibasilar atelectasis. No overt pulmonary edema. Partially visualized anterior spinal fusion hardware. IMPRESSION: Cardiac enlargement with central vascular prominence. Electronically Signed   By: Dahlia Bailiff MD   On: 12/06/2020 21:48   ECHOCARDIOGRAM COMPLETE  Result Date: 12/07/2020    ECHOCARDIOGRAM REPORT   Patient  Name:   Alexis [name] Date of Exam: 12/07/2020 Medical Rec #:  [id number]         Height:       65.0 in Accession #:    [id number]        Weight:       184.5 lb Date of Birth:  [birth year]         BSA:          1.912 m Patient Age:    64 years          BP:  121/62 mmHg Patient Gender: F                 HR:           83 bpm. Exam Location:  Inpatient Procedure: 2D Echo Indications:    atrial fibrillation  History:        Patient has prior history of Echocardiogram examinations, most                 recent 06/23/2007. Risk Factors:Sleep Apnea, Diabetes and                 Dyslipidemia.  Sonographer:    Johny Chess Referring Phys: [id number] DAVID MANUEL ORTIZ  Sonographer Comments: Pain from probe pressure and Image acquisition challenging due to patient body habitus. IMPRESSIONS  1. Left ventricular ejection fraction, by estimation, is 60 to 65%. The left ventricle has normal function. The left ventricle has no regional wall motion abnormalities. Left ventricular diastolic parameters were normal.  2. Right ventricular systolic function is normal. The right ventricular size is mildly enlarged. There is mildly elevated pulmonary artery systolic pressure. The estimated right ventricular systolic pressure is 0000000 mmHg.  3. The mitral valve is degenerative. No evidence of mitral valve regurgitation. No evidence of mitral stenosis. Moderate mitral annular calcification.  4. The aortic valve is tricuspid. There is mild calcification of the aortic valve. There is mild thickening of the aortic valve. Aortic valve regurgitation is not visualized. No aortic stenosis is present.  5. The inferior vena cava is normal in size with greater than 50% respiratory variability, suggesting right atrial pressure of 3 mmHg. FINDINGS  Left Ventricle: Left ventricular ejection fraction, by estimation, is 60 to 65%. The left ventricle has normal function. The left ventricle has no regional wall motion abnormalities. The left  ventricular internal cavity size was normal in size. There is  no left ventricular hypertrophy. Left ventricular diastolic parameters were normal. Right Ventricle: The right ventricular size is mildly enlarged. No increase in right ventricular wall thickness. Right ventricular systolic function is normal. There is mildly elevated pulmonary artery systolic pressure. The tricuspid regurgitant velocity is 3.16 m/s, and with an assumed right atrial pressure of 3 mmHg, the estimated right ventricular systolic pressure is 0000000 mmHg. Left Atrium: Left atrial size was normal in size. Right Atrium: Right atrial size was normal in size. Pericardium: Trivial pericardial effusion is present. Presence of pericardial fat pad. Mitral Valve: The mitral valve is degenerative in appearance. Moderate mitral annular calcification. No evidence of mitral valve regurgitation. No evidence of mitral valve stenosis. Tricuspid Valve: The tricuspid valve is grossly normal. Tricuspid valve regurgitation is mild . No evidence of tricuspid stenosis. Aortic Valve: The aortic valve is tricuspid. There is mild calcification of the aortic valve. There is mild thickening of the aortic valve. Aortic valve regurgitation is not visualized. No aortic stenosis is present. Pulmonic Valve: The pulmonic valve was grossly normal. Pulmonic valve regurgitation is not visualized. No evidence of pulmonic stenosis. Aorta: The aortic root and ascending aorta are structurally normal, with no evidence of dilitation. Venous: The inferior vena cava is normal in size with greater than 50% respiratory variability, suggesting right atrial pressure of 3 mmHg. IAS/Shunts: The atrial septum is grossly normal.  LEFT VENTRICLE PLAX 2D LVIDd:         4.40 cm  Diastology LVIDs:         2.80 cm  LV e' medial:    9.46 cm/s LV PW:  0.90 cm  LV E/e' medial:  12.3 LV IVS:        1.00 cm  LV e' lateral:   7.62 cm/s LVOT diam:     1.80 cm  LV E/e' lateral: 15.2 LV SV:         71  LV SV Index:   37 LVOT Area:     2.54 cm  RIGHT VENTRICLE             IVC RV S prime:     16.00 cm/s  IVC diam: 2.20 cm TAPSE (M-mode): 2.4 cm LEFT ATRIUM             Index       RIGHT ATRIUM           Index LA diam:        4.00 cm 2.09 cm/m  RA Area:     14.30 cm LA Vol (A2C):   52.4 ml 27.41 ml/m RA Volume:   36.00 ml  18.83 ml/m LA Vol (A4C):   57.1 ml 29.87 ml/m LA Biplane Vol: 59.7 ml 31.23 ml/m  AORTIC VALVE LVOT Vmax:   147.00 cm/s LVOT Vmean:  100.000 cm/s LVOT VTI:    0.278 m  AORTA Ao Root diam: 3.10 cm Ao Asc diam:  3.70 cm MITRAL VALVE                TRICUSPID VALVE MV Area (PHT): 3.99 cm     TR Peak grad:   39.9 mmHg MV Decel Time: 190 msec     TR Vmax:        316.00 cm/s MV E velocity: 116.00 cm/s MV A velocity: 119.00 cm/s  SHUNTS MV E/A ratio:  0.97         Systemic VTI:  0.28 m                             Systemic Diam: 1.80 cm Eleonore Chiquito MD Electronically signed by Eleonore Chiquito MD Signature Date/Time: 12/07/2020/2:18:31 PM    Final    VAS [place] UPPER EXTREMITY VENOUS DUPLEX  Result Date: 12/08/2020 UPPER VENOUS STUDY  Patient Name:  Alexis [name]  Date of Exam:   12/08/2020 Medical Rec #: [id number]          Accession #:    [id number] Date of Birth: 01/20/1951          Patient Gender: F Patient Age:   66Y Exam Location:  [facility] Procedure:      VAS [place] UPPER EXTREMITY VENOUS DUPLEX Referring Phys: [id number] [facility] Diania Co --------------------------------------------------------------------------------  Indications: Right hand swelling Comparison Study: No prior study Performing Technologist: Maudry Mayhew MHA, RDMS, RVT, RDCS  Examination Guidelines: A complete evaluation includes B-mode imaging, spectral Doppler, color Doppler, and power Doppler as needed of all accessible portions of each vessel. Bilateral testing is considered an integral part of a complete examination. Limited examinations for reoccurring indications may be performed as noted.  Right Findings:  +----------+------------+---------+-----------+----------+-------------+ RIGHT     CompressiblePhasicitySpontaneousProperties   Summary    +----------+------------+---------+-----------+----------+-------------+ IJV           Full       Yes       Yes                            +----------+------------+---------+-----------+----------+-------------+ Subclavian    Full       Yes  Yes                            +----------+------------+---------+-----------+----------+-------------+ Axillary      Full       Yes       Yes              Rouleaux flow +----------+------------+---------+-----------+----------+-------------+ Brachial      Full       Yes       Yes              Rouleaux flow +----------+------------+---------+-----------+----------+-------------+ Radial        Full                                                +----------+------------+---------+-----------+----------+-------------+ Ulnar         Full                                                +----------+------------+---------+-----------+----------+-------------+ Cephalic      Full                                                +----------+------------+---------+-----------+----------+-------------+ Basilic       Full                                                +----------+------------+---------+-----------+----------+-------------+  Left Findings: +----------+------------+---------+-----------+----------+-------+ LEFT      CompressiblePhasicitySpontaneousPropertiesSummary +----------+------------+---------+-----------+----------+-------+ Subclavian               Yes       Yes                      +----------+------------+---------+-----------+----------+-------+  Summary:  Right: No evidence of deep vein thrombosis in the upper extremity. No evidence of superficial vein thrombosis in the upper extremity.  Left: No evidence of thrombosis in the subclavian.  *See table(s)  above for measurements and observations.    Preliminary         Scheduled Meds: . apixaban  5 mg Oral BID  . atorvastatin  20 mg Oral QHS  . Chlorhexidine Gluconate Cloth  6 each Topical Daily  . docusate sodium  100 mg Oral BID  . feeding supplement  237 mL Oral BID BM  . insulin aspart  0-15 Units Subcutaneous TID WC  . insulin aspart  5 Units Subcutaneous TID WC  . [START ON 12/09/2020] insulin glargine  35 Units Subcutaneous Daily  . insulin glargine  5 Units Subcutaneous Once  . metoprolol succinate  100 mg Oral Daily  . midodrine  5 mg Oral BID WC  . pantoprazole  40 mg Oral BID  . polyethylene glycol  17 g Oral BID  . pregabalin  50 mg Oral BID  . tamsulosin  0.4 mg Oral QHS  . venlafaxine XR  150 mg Oral BID   Continuous Infusions: . sodium chloride 50 mL/hr at 12/07/20 1131  .  ceFEPime (MAXIPIME) IV 2 g (12/08/20 1013)  . metronidazole 500 mg (12/08/20 1210)  . vancomycin 750 mg (12/08/20 0512)    Assessment & Plan:   Principal Problem:   Subcapital fracture of left femur (HCC) Active Problems:   Hyperlipidemia   Anxiety and depression   OSA (obstructive sleep apnea)   Coronary artery calcification seen on computed tomography   Type 2 diabetes mellitus (HCC)   Leukocytosis   Fall at home, initial encounter   Hypotension   Acute metabolic encephalopathy   Paroxysmal atrial fibrillation (HCC)   Acute blood loss anemia   Left subcapital femur fracture: POD2  Secondary to mechanical fall  Status post left total hip arthroplasty on 4/26 PT recommends SNF Per Ortho Dr. Lyla Glassing okay to start Lovenox for DVT prophylaxis via chat 4/28-Ortho will like patient to be on aspirin 81 mg twice daily at discharge however patient is allergic to aspirin we will discuss this with him. 4/29-WBAT with walker Lovenox to DC for 30 days per Ortho, but patient was started on Eliquis Will need to follow-up with orthopedics    Leukocytosi/low grade fever Doubt infection.   Possibly low-grade postop fever.   Urine culture and blood culture so far negative, if negative by tomorrow we will discontinue antibiotics    PAF/RVR- Now in SR on tele Cardiology consulted, input appreciated. Likely multifactorial- has OSA with profoundly anemic post op Started on beta blk.  Continue amiodarone Ck TSH Ck echo Correct anemia 4/29-started on eliquis since h/h stable.  Cards rec. D/c plavix Decrease midodrine since bp better.  Echo normal EF without any regional wall motion abnormalities    Post op blood loss- Hg 6.4  Hemoglobin was 6.4 ,s/p 2units PRBC Hemoglobin remained stable continue to monitor H&H    Urinary retention-post op Foley in place, will do voiding trial soon once more medically stable and has bowel movement Continue bowel regimen Continue Flomax    AKI:  Renal function worsening. NO void. Possibly multifactorial as bp low, urinary retention, and BG elevated (prerenal) 44/29-creatinine improved.   DC IV fluids     Diabetes type 2: A1c of 10.3   Takes Farxiga, metformin, Tresiba, dulaglutide at home. BG uncontrolled  4/29-it appears did not receive lantus, although family thinks was given. Unsure what happened? For now will continue lantus 30 u, if need to will increase to 35 in am Continue riss and novolog with meals     Coronary artery disease: Recently had coronary CTA which showed diffuse nonobstructive coronary artery disease.  On statin and Plavix at home. plavix d/cd Unable to take aspirin due to GI issues per daughter  Hypertension:  Now hypotensive. Initially BP meds were held, due to atrial fibrillation and rate control cardiology decrease midodrine, and if bp remains stable will d/c it   Hyperlipidemia: On Lipitor  Anxiety/depression: On venlafaxine, on Ativan as needed  OSA: Does not use CPAP  GERD: Protonix     DVT prophylaxis:eliquis Code Status:full Family Communication: Daughter and husband at  bedside on  Status is: Inpatient  Remains inpatient appropriate because:Inpatient level of care appropriate due to severity of illness   Dispo: The patient is from: Home              Anticipated d/c is to: SNF              Patient currently is not medically stable to d/c.   Difficult to place patient No  LOS: 4 days   Time spent: 35 minutes with more than 50% on [place], MD [facility] Pager [phone]  If 7PM-7AM, please contact night-coverage 12/08/2020, 1:15 PM

## 2020-12-08 NOTE — Progress Notes (Addendum)
Progress Note  Patient Name: Alexis [name] Date of Encounter: 12/08/2020  [facility] Cardiologist: Pixie Casino, MD   Subjective   Was being seen for Afib and had converted back to NSR for 24 hours. Cardiology signed off this AM. Echo showed normal LVEF with normal diastolic function, mild pulmonary HTN and normal biatrial size.   During PT patient noted to go back Into afib with elevated rate to 130s and cardiology asked to come see again.   Inpatient Medications    Scheduled Meds: . apixaban  5 mg Oral BID  . atorvastatin  20 mg Oral QHS  . Chlorhexidine Gluconate Cloth  6 each Topical Daily  . docusate sodium  100 mg Oral BID  . feeding supplement  237 mL Oral BID BM  . insulin aspart  0-15 Units Subcutaneous TID WC  . insulin aspart  5 Units Subcutaneous TID WC  . [START ON 12/09/2020] insulin glargine  35 Units Subcutaneous Daily  . insulin glargine  5 Units Subcutaneous Once  . metoprolol succinate  100 mg Oral Daily  . metoprolol tartrate  5 mg Intravenous Once  . midodrine  5 mg Oral BID WC  . pantoprazole  40 mg Oral BID  . polyethylene glycol  17 g Oral BID  . pregabalin  50 mg Oral BID  . tamsulosin  0.4 mg Oral QHS  . venlafaxine XR  150 mg Oral BID   Continuous Infusions: . sodium chloride 50 mL/hr at 12/07/20 1131  . ceFEPime (MAXIPIME) IV 2 g (12/08/20 1013)  . metronidazole 500 mg (12/08/20 1210)  . vancomycin 750 mg (12/08/20 0512)   PRN Meds: acetaminophen, ALPRAZolam, diphenhydrAMINE, menthol-cetylpyridinium **OR** phenol, metoCLOPramide **OR** metoCLOPramide (REGLAN) injection, ondansetron **OR** ondansetron (ZOFRAN) IV, SUMAtriptan   Vital Signs    Vitals:   12/08/20 0613 12/08/20 0835 12/08/20 1205 12/08/20 1600  BP: (!) 129/57 (!) 148/60 140/66 (!) 112/57  Pulse: 100 91 87 74  Resp:  (!) 21 (!) 25 16  Temp:  99.6 F (37.6 C) 100 F (37.8 C) 98.8 F (37.1 C)  TempSrc:  Oral Axillary Oral  SpO2:  91% 92% 92%  Weight:         Intake/Output Summary (Last 24 hours) at 12/08/2020 1644 Last data filed at 12/08/2020 1030 Gross per 24 hour  Intake 390 ml  Output 2550 ml  Net -2160 ml   Last 3 Weights 12/06/2020 11/07/2020 07/19/2020  Weight (lbs) 184 lb 8.4 oz 184 lb 9.6 oz 179 lb  Weight (kg) 83.7 kg 83.734 kg 81.194 kg      Telemetry    Afib Hr 130-240s - Personally Reviewed  ECG    pending - Personally Reviewed  Physical Exam   GEN: No acute distress.   Neck: No JVD Cardiac: IRreg IRreg, no murmurs, rubs, or gallops.  Respiratory: Clear to auscultation bilaterally. GI: Soft, nontender, non-distended  MS: No edema; No deformity. Neuro:  Nonfocal  Psych: Normal affect   Labs    High Sensitivity Troponin:  No results for input(s): TROPONINIHS in the last 720 hours.    Chemistry Recent Labs  Lab 12/04/20 0549 12/05/20 0416 12/06/20 2124 12/07/20 0826 12/08/20 1233  NA 137   < > 128* 131* 131*  K 4.4   < > 3.6 3.9 3.5  CL 101   < > 99 99 101  CO2 27   < > 23 22 22   GLUCOSE 243*   < > 341* 358* 310*  BUN 14   < >  47* 41* 21  CREATININE 0.73   < > 1.50* 1.22* 0.73  CALCIUM 9.0   < > 7.5* 7.9* 8.2*  PROT 6.9  --  4.5*  --   --   ALBUMIN 3.8  --  2.1*  --   --   AST 32  --  29  --   --   ALT 19  --  9  --   --   ALKPHOS 130*  --  57  --   --   BILITOT 0.8  --  0.6  --   --   GFRNONAA >60   < > 37* 48* >60  ANIONGAP 9   < > 6 10 8    < > = values in this interval not displayed.     Hematology Recent Labs  Lab 12/06/20 2124 12/07/20 0826 12/08/20 1233  WBC 9.5 10.5 10.0  RBC 2.44* 3.39* 3.43*  HGB 6.4* 9.5* 9.7*  HCT 20.1* 28.8* 28.5*  MCV 82.4 85.0 83.1  MCH 26.2 28.0 28.3  MCHC 31.8 33.0 34.0  RDW 15.2 16.2* 16.3*  PLT 142* 131* 154    BNP Recent Labs  Lab 12/06/20 2124  BNP 220.0*     DDimer No results for input(s): DDIMER in the last 168 hours.   Radiology    CT HEAD WO CONTRAST  Result Date: 12/06/2020 CLINICAL DATA:  Change in mental status. EXAM: CT HEAD  WITHOUT CONTRAST TECHNIQUE: Contiguous axial images were obtained from the base of the skull through the vertex without intravenous contrast. COMPARISON:  None. FINDINGS: Brain: Normal for age atrophy. No intracranial hemorrhage, mass effect, or midline shift. No hydrocephalus. The basilar cisterns are patent. No evidence of territorial infarct or acute ischemia. No extra-axial or intracranial fluid collection. Vascular: Atherosclerosis of skullbase vasculature without hyperdense vessel or abnormal calcification. Skull: No fracture or focal lesion. Sinuses/Orbits: Paranasal sinuses and mastoid air cells are clear. The visualized orbits are unremarkable. Other: None. IMPRESSION: Unremarkable noncontrast head CT for age. Electronically Signed   By: Keith Rake M.D.   On: 12/06/2020 23:28   DG CHEST PORT 1 VIEW  Result Date: 12/06/2020 CLINICAL DATA:  Altered mental status EXAM: PORTABLE CHEST 1 VIEW COMPARISON:  December 04, 2020 FINDINGS: Cardiac enlargement with central vascular prominence. Aortic atherosclerosis. Low lung volumes with bibasilar atelectasis. No overt pulmonary edema. Partially visualized anterior spinal fusion hardware. IMPRESSION: Cardiac enlargement with central vascular prominence. Electronically Signed   By: Dahlia Bailiff MD   On: 12/06/2020 21:48   ECHOCARDIOGRAM COMPLETE  Result Date: 12/07/2020    ECHOCARDIOGRAM REPORT   Patient Name:   Alexis [name] Date of Exam: 12/07/2020 Medical Rec #:  [id number]         Height:       65.0 in Accession #:    [id number]        Weight:       184.5 lb Date of Birth:  16-Mar-1951         BSA:          1.912 m Patient Age:    70 years          BP:           121/62 mmHg Patient Gender: F                 HR:           83 bpm. Exam Location:  Inpatient Procedure: 2D Echo Indications:    atrial fibrillation  History:        Patient has prior history of Echocardiogram examinations, most                 recent 06/23/2007. Risk Factors:Sleep Apnea,  Diabetes and                 Dyslipidemia.  Sonographer:    Johny Chess Referring Phys: [id number] DAVID MANUEL ORTIZ  Sonographer Comments: Pain from probe pressure and Image acquisition challenging due to patient body habitus. IMPRESSIONS  1. Left ventricular ejection fraction, by estimation, is 60 to 65%. The left ventricle has normal function. The left ventricle has no regional wall motion abnormalities. Left ventricular diastolic parameters were normal.  2. Right ventricular systolic function is normal. The right ventricular size is mildly enlarged. There is mildly elevated pulmonary artery systolic pressure. The estimated right ventricular systolic pressure is 0000000 mmHg.  3. The mitral valve is degenerative. No evidence of mitral valve regurgitation. No evidence of mitral stenosis. Moderate mitral annular calcification.  4. The aortic valve is tricuspid. There is mild calcification of the aortic valve. There is mild thickening of the aortic valve. Aortic valve regurgitation is not visualized. No aortic stenosis is present.  5. The inferior vena cava is normal in size with greater than 50% respiratory variability, suggesting right atrial pressure of 3 mmHg. FINDINGS  Left Ventricle: Left ventricular ejection fraction, by estimation, is 60 to 65%. The left ventricle has normal function. The left ventricle has no regional wall motion abnormalities. The left ventricular internal cavity size was normal in size. There is  no left ventricular hypertrophy. Left ventricular diastolic parameters were normal. Right Ventricle: The right ventricular size is mildly enlarged. No increase in right ventricular wall thickness. Right ventricular systolic function is normal. There is mildly elevated pulmonary artery systolic pressure. The tricuspid regurgitant velocity is 3.16 m/s, and with an assumed right atrial pressure of 3 mmHg, the estimated right ventricular systolic pressure is 0000000 mmHg. Left Atrium: Left atrial size  was normal in size. Right Atrium: Right atrial size was normal in size. Pericardium: Trivial pericardial effusion is present. Presence of pericardial fat pad. Mitral Valve: The mitral valve is degenerative in appearance. Moderate mitral annular calcification. No evidence of mitral valve regurgitation. No evidence of mitral valve stenosis. Tricuspid Valve: The tricuspid valve is grossly normal. Tricuspid valve regurgitation is mild . No evidence of tricuspid stenosis. Aortic Valve: The aortic valve is tricuspid. There is mild calcification of the aortic valve. There is mild thickening of the aortic valve. Aortic valve regurgitation is not visualized. No aortic stenosis is present. Pulmonic Valve: The pulmonic valve was grossly normal. Pulmonic valve regurgitation is not visualized. No evidence of pulmonic stenosis. Aorta: The aortic root and ascending aorta are structurally normal, with no evidence of dilitation. Venous: The inferior vena cava is normal in size with greater than 50% respiratory variability, suggesting right atrial pressure of 3 mmHg. IAS/Shunts: The atrial septum is grossly normal.  LEFT VENTRICLE PLAX 2D LVIDd:         4.40 cm  Diastology LVIDs:         2.80 cm  LV e' medial:    9.46 cm/s LV PW:         0.90 cm  LV E/e' medial:  12.3 LV IVS:        1.00 cm  LV e' lateral:   7.62 cm/s LVOT diam:     1.80 cm  LV E/e' lateral: 15.2 LV SV:  71 LV SV Index:   37 LVOT Area:     2.54 cm  RIGHT VENTRICLE             IVC RV S prime:     16.00 cm/s  IVC diam: 2.20 cm TAPSE (M-mode): 2.4 cm LEFT ATRIUM             Index       RIGHT ATRIUM           Index LA diam:        4.00 cm 2.09 cm/m  RA Area:     14.30 cm LA Vol (A2C):   52.4 ml 27.41 ml/m RA Volume:   36.00 ml  18.83 ml/m LA Vol (A4C):   57.1 ml 29.87 ml/m LA Biplane Vol: 59.7 ml 31.23 ml/m  AORTIC VALVE LVOT Vmax:   147.00 cm/s LVOT Vmean:  100.000 cm/s LVOT VTI:    0.278 m  AORTA Ao Root diam: 3.10 cm Ao Asc diam:  3.70 cm MITRAL VALVE                 TRICUSPID VALVE MV Area (PHT): 3.99 cm     TR Peak grad:   39.9 mmHg MV Decel Time: 190 msec     TR Vmax:        316.00 cm/s MV E velocity: 116.00 cm/s MV A velocity: 119.00 cm/s  SHUNTS MV E/A ratio:  0.97         Systemic VTI:  0.28 m                             Systemic Diam: 1.80 cm Eleonore Chiquito MD Electronically signed by Eleonore Chiquito MD Signature Date/Time: 12/07/2020/2:18:31 PM    Final    VAS [place] UPPER EXTREMITY VENOUS DUPLEX  Result Date: 12/08/2020 UPPER VENOUS STUDY  Patient Name:  Alexis [name]  Date of Exam:   12/08/2020 Medical Rec #: [id number]          Accession #:    [id number] Date of Birth: 28-Jun-1951          Patient Gender: F Patient Age:   48Y Exam Location:  [facility] Procedure:      VAS [place] UPPER EXTREMITY VENOUS DUPLEX Referring Phys: [id number] [facility] AMERY --------------------------------------------------------------------------------  Indications: Right hand swelling Comparison Study: No prior study Performing Technologist: Maudry Mayhew MHA, RDMS, RVT, RDCS  Examination Guidelines: A complete evaluation includes B-mode imaging, spectral Doppler, color Doppler, and power Doppler as needed of all accessible portions of each vessel. Bilateral testing is considered an integral part of a complete examination. Limited examinations for reoccurring indications may be performed as noted.  Right Findings: +----------+------------+---------+-----------+----------+-------------+ RIGHT     CompressiblePhasicitySpontaneousProperties   Summary    +----------+------------+---------+-----------+----------+-------------+ IJV           Full       Yes       Yes                            +----------+------------+---------+-----------+----------+-------------+ Subclavian    Full       Yes       Yes                            +----------+------------+---------+-----------+----------+-------------+ Axillary      Full       Yes  Yes               Rouleaux flow +----------+------------+---------+-----------+----------+-------------+ Brachial      Full       Yes       Yes              Rouleaux flow +----------+------------+---------+-----------+----------+-------------+ Radial        Full                                                +----------+------------+---------+-----------+----------+-------------+ Ulnar         Full                                                +----------+------------+---------+-----------+----------+-------------+ Cephalic      Full                                                +----------+------------+---------+-----------+----------+-------------+ Basilic       Full                                                +----------+------------+---------+-----------+----------+-------------+  Left Findings: +----------+------------+---------+-----------+----------+-------+ LEFT      CompressiblePhasicitySpontaneousPropertiesSummary +----------+------------+---------+-----------+----------+-------+ Subclavian               Yes       Yes                      +----------+------------+---------+-----------+----------+-------+  Summary:  Right: No evidence of deep vein thrombosis in the upper extremity. No evidence of superficial vein thrombosis in the upper extremity.  Left: No evidence of thrombosis in the subclavian.  *See table(s) above for measurements and observations.    Preliminary     Cardiac Studies   Echo 1. Left ventricular ejection fraction, by estimation, is 60 to 65%. The  left ventricle has normal function. The left ventricle has no regional  wall motion abnormalities. Left ventricular diastolic parameters were  normal.  2. Right ventricular systolic function is normal. The right ventricular  size is mildly enlarged. There is mildly elevated pulmonary artery  systolic pressure. The estimated right ventricular systolic pressure is  0000000 mmHg.  3. The mitral  valve is degenerative. No evidence of mitral valve  regurgitation. No evidence of mitral stenosis. Moderate mitral annular  calcification.  4. The aortic valve is tricuspid. There is mild calcification of the  aortic valve. There is mild thickening of the aortic valve. Aortic valve  regurgitation is not visualized. No aortic stenosis is present.  5. The inferior vena cava is normal in size with greater than 50%  respiratory variability, suggesting right atrial pressure of 3 mmHg.   Patient Profile     70 y.o. female with pmh of HLD, HTN, DM2, fatty liver disease, IBS, Cad, OSA, GERD who was hospitalized for Lhip fx L THA who cardiology is seeing for post-op Afib.   Assessment & Plan    New onset afib Post-op Afib  -  CHADSVASC of 5 and plavix was stopped and Eliquis 5mg  BID - Hgb stable post infusion - Metoprolol 100mg  daily, midodrine to BID, recommended to restart amlodipine if necesary - Echo showed normal LVEF - Patient spontaneously converted to SR, now back in Afib RVR during PT - She is asymptomatic - check EKG - can give IV metorpolol 5mg  and resume BB tomorrow. Can transition to Metoprolol 50mg  BID. If patient doesn't self-convert as before consider TEE/DCCV. MD to see    For questions or updates, please contact [place] Please consult [email or web address] for contact info under      Signed, Cadence Ninfa Meeker, PA-C  12/08/2020, 4:44 PM    Patient seen, examined. Available data reviewed. Agree with findings, assessment, and plan as outlined by Cadence Kathlen Mody, PA-C. The patient is interviewed and examined. Husband and daughter are present. Agree with exam findings above. Tele shows HR 130-150 bpm at rest. I do not think beta blocker will control her HR. Recommend IV amiodarone bolus and drip in short term, hopefully will convert to sinus rhythm and be able to be controlled with short term PO amiodarone to get her beyond the post-op state. Orders written for IV amiodarone. Pt  will need second IV. Discussed with patient, family, and nursing staff.  Sherren Mocha, M.D. 12/08/2020 6:22 PM

## 2020-12-08 NOTE — Progress Notes (Signed)
Right upper extremity venous duplex completed. Refer to "CV Proc" under chart review to view preliminary results.  12/08/2020 12:15 PM Kelby Aline., MHA, RVT, RDCS, RDMS

## 2020-12-08 NOTE — TOC Benefit Eligibility Note (Signed)
Transition of Care [facility]) Benefit Eligibility Note    Patient Details  Name: Alexis [name] MRN: [id number] Date of Birth: 19-Jan-1951   Medication/Dose: Arne Cleveland  5 MG BID  Covered?: Yes  Tier: 2 Drug  Prescription Coverage [facility]erred [facility]acy: [facility]  and  [facility]   90 DAY SUPPLY FOR M/O $60.00  Spoke with Person/Company/Phone Number:: KEN @ [facility] # [phone]  Co-Pay: $ 30.00  Prior Approval: No  Deductible:  (NO DEDUCTIBLE WITH PLAN  and OUT-OF-POCKET :UNMET)  Additional Notes: APIXABAN : NON-FORMULARY    Memory Argue Phone Number: 12/08/2020, 12:32 PM

## 2020-12-08 NOTE — Progress Notes (Signed)
Inpatient Diabetes Program Recommendations  AACE/ADA: New Consensus Statement on Inpatient Glycemic Control (2015)  Target Ranges:  Prepandial:   less than 140 mg/dL      Peak postprandial:   less than 180 mg/dL (1-2 hours)      Critically ill patients:  140 - 180 mg/dL   Lab Results  Component Value Date   GLUCAP 282 (H) 12/08/2020   HGBA1C 10.3 (H) 12/04/2020    Review of Glycemic Control Results for Alexis [name], Alexis [name] (MRN [id number]) as of 12/08/2020 10:35  Ref. Range 12/07/2020 07:47 12/07/2020 11:11 12/07/2020 18:27 12/07/2020 22:06 12/08/2020 08:34  Glucose-Capillary Latest Ref Range: 70 - 99 mg/dL 261 (H) 317 (H) 342 (H) 280 (H) 282 (H)   Inpatient Diabetes Program Recommendations:   Patient is increased to Lantus 30 units qd -Patient missed yesterday's Lantus dose   Thank you, Nani Gasser. Jaselyn Nahm, RN, MSN, CDE  Diabetes Coordinator Inpatient Glycemic Control Team Team Pager [phone] (8am-5pm) 12/08/2020 10:38 AM

## 2020-12-08 NOTE — Progress Notes (Signed)
DAILY PROGRESS NOTE   Patient Name: Alexis [name] Date of Encounter: 12/08/2020 Cardiologist: Pixie Casino, MD  Chief Complaint   Hip pain  Patient Profile   Alexis [name] is a 70 y.o. female with HLD,HTN,DM2,fatty liver disease, IBS,CAD, OSAand GERD who is hospitalized for L hip fx now s/p L THA who cardiology is consulted for post op AF.   Subjective   Converted spontaneously back to sinus and has been maintaining for 24 hours. Echo yesterday shows normal LVEF with normal diastolic function, mild pulmonary hypertension and normal biatrial size.  Objective   Vitals:   12/08/20 0000 12/08/20 0312 12/08/20 0613 12/08/20 0835  BP: (!) 118/58 (!) 123/56 (!) 129/57 (!) 148/60  Pulse: 86 89 100 91  Resp:  20  (!) 21  Temp:  98.1 F (36.7 C)  99.6 F (37.6 C)  TempSrc:  Oral  Oral  SpO2:  92%  91%  Weight:        Intake/Output Summary (Last 24 hours) at 12/08/2020 0944 Last data filed at 12/08/2020 0400 Gross per 24 hour  Intake 390 ml  Output 1550 ml  Net -1160 ml   Filed Weights   12/06/20 1400  Weight: 83.7 kg    Physical Exam   General appearance: alert and no distress Neck: no carotid bruit, no JVD and thyroid not enlarged, symmetric, no tenderness/mass/nodules Lungs: clear to auscultation bilaterally Heart: regular rate and rhythm, S1, S2 normal, no murmur, click, rub or gallop Abdomen: soft, non-tender; bowel sounds normal; no masses,  no organomegaly Extremities: extremities normal, atraumatic, no cyanosis or edema Pulses: 2+ and symmetric Skin: Skin color, texture, turgor normal. No rashes or lesions Neurologic: Grossly normal Psych: Pleasant  Inpatient Medications    Scheduled Meds: . atorvastatin  20 mg Oral QHS  . Chlorhexidine Gluconate Cloth  6 each Topical Daily  . clopidogrel  75 mg Oral Daily  . docusate sodium  100 mg Oral BID  . feeding supplement  237 mL Oral BID BM  . insulin aspart  0-15 Units Subcutaneous TID WC  .  insulin aspart  5 Units Subcutaneous TID WC  . insulin glargine  30 Units Subcutaneous Daily  . metoprolol tartrate  25 mg Oral Q6H  . midodrine  5 mg Oral TID WC  . pantoprazole  40 mg Oral BID  . polyethylene glycol  17 g Oral Daily  . pregabalin  50 mg Oral BID  . tamsulosin  0.4 mg Oral QHS  . venlafaxine XR  150 mg Oral BID    Continuous Infusions: . sodium chloride 50 mL/hr at 12/07/20 1131  . ceFEPime (MAXIPIME) IV 2 g (12/07/20 2145)  . metronidazole 500 mg (12/08/20 0241)  . vancomycin 750 mg (12/08/20 0512)    PRN Meds: acetaminophen, ALPRAZolam, diphenhydrAMINE, menthol-cetylpyridinium **OR** phenol, metoCLOPramide **OR** metoCLOPramide (REGLAN) injection, ondansetron **OR** ondansetron (ZOFRAN) IV, SUMAtriptan, zolpidem   Labs   Results for orders placed or performed during the hospital encounter of 12/04/20 (from the past 48 hour(s))  Glucose, capillary     Status: Abnormal   Collection Time: 12/06/20 12:25 PM  Result Value Ref Range   Glucose-Capillary 375 (H) 70 - 99 mg/dL    Comment: Glucose reference range applies only to samples taken after fasting for at least 8 hours.  Glucose, capillary     Status: Abnormal   Collection Time: 12/06/20  4:50 PM  Result Value Ref Range   Glucose-Capillary 352 (H) 70 - 99 mg/dL  Comment: Glucose reference range applies only to samples taken after fasting for at least 8 hours.  CBC     Status: Abnormal   Collection Time: 12/06/20  5:09 PM  Result Value Ref Range   WBC 9.3 4.0 - 10.5 K/uL   RBC 2.67 (L) 3.87 - 5.11 MIL/uL   Hemoglobin 7.0 (L) 12.0 - 15.0 g/dL   HCT 22.3 (L) 36.0 - 46.0 %   MCV 83.5 80.0 - 100.0 fL   MCH 26.2 26.0 - 34.0 pg   MCHC 31.4 30.0 - 36.0 g/dL   RDW 15.3 11.5 - 15.5 %   Platelets 154 150 - 400 K/uL   nRBC 0.0 0.0 - 0.2 %    Comment: Performed at [facility], [place] [address], [place], [place] [address]  Glucose, capillary     Status: Abnormal   Collection Time: 12/06/20  8:23 PM  Result  Value Ref Range   Glucose-Capillary 318 (H) 70 - 99 mg/dL    Comment: Glucose reference range applies only to samples taken after fasting for at least 8 hours.   Comment 1 Notify RN   Comprehensive metabolic panel     Status: Abnormal   Collection Time: 12/06/20  9:24 PM  Result Value Ref Range   Sodium 128 (L) 135 - 145 mmol/L   Potassium 3.6 3.5 - 5.1 mmol/L   Chloride 99 98 - 111 mmol/L   CO2 23 22 - 32 mmol/L   Glucose, Bld 341 (H) 70 - 99 mg/dL    Comment: Glucose reference range applies only to samples taken after fasting for at least 8 hours.   BUN 47 (H) 8 - 23 mg/dL   Creatinine, Ser 1.50 (H) 0.44 - 1.00 mg/dL   Calcium 7.5 (L) 8.9 - 10.3 mg/dL   Total Protein 4.5 (L) 6.5 - 8.1 g/dL   Albumin 2.1 (L) 3.5 - 5.0 g/dL   AST 29 15 - 41 U/L   ALT 9 0 - 44 U/L   Alkaline Phosphatase 57 38 - 126 U/L   Total Bilirubin 0.6 0.3 - 1.2 mg/dL   GFR, Estimated 37 (L) >60 mL/min    Comment: (NOTE) Calculated using the CKD-EPI Creatinine Equation (2021)    Anion gap 6 5 - 15    Comment: Performed at [facility], [place] [address], [place], [place] [address]  CBC with Differential/Platelet     Status: Abnormal   Collection Time: 12/06/20  9:24 PM  Result Value Ref Range   WBC 9.5 4.0 - 10.5 K/uL   RBC 2.44 (L) 3.87 - 5.11 MIL/uL   Hemoglobin 6.4 (LL) 12.0 - 15.0 g/dL    Comment: REPEATED TO VERIFY THIS CRITICAL RESULT HAS VERIFIED AND BEEN CALLED TO RN SAISON LA. BY MESSAN HOUEGNIFIO ON 04 27 2022 AT 2227, AND HAS BEEN READ BACK.     HCT 20.1 (L) 36.0 - 46.0 %   MCV 82.4 80.0 - 100.0 fL   MCH 26.2 26.0 - 34.0 pg   MCHC 31.8 30.0 - 36.0 g/dL   RDW 15.2 11.5 - 15.5 %   Platelets 142 (L) 150 - 400 K/uL   nRBC 0.0 0.0 - 0.2 %   Neutrophils Relative % 75 %   Neutro Abs 7.1 1.7 - 7.7 K/uL   Lymphocytes Relative 12 %   Lymphs Abs 1.2 0.7 - 4.0 K/uL   Monocytes Relative 13 %   Monocytes Absolute 1.2 (H) 0.1 - 1.0 K/uL   Eosinophils Relative  0 %   Eosinophils Absolute 0.0 0.0 -  0.5 K/uL   Basophils Relative 0 %   Basophils Absolute 0.0 0.0 - 0.1 K/uL   Immature Granulocytes 0 %   Abs Immature Granulocytes 0.03 0.00 - 0.07 K/uL    Comment: Performed at [facility], [place] [address] [place], [place] [address]  Brain natriuretic peptide     Status: Abnormal   Collection Time: 12/06/20  9:24 PM  Result Value Ref Range   B Natriuretic Peptide 220.0 (H) 0.0 - 100.0 pg/mL    Comment: Performed at [facility] [address] [place], [place] [address]  Lactic acid, plasma     Status: None   Collection Time: 12/06/20  9:24 PM  Result Value Ref Range   Lactic Acid, Venous 1.4 0.5 - 1.9 mmol/L    Comment: Performed at [facility] [address], [place], [place] [address]  Blood gas, venous     Status: Abnormal   Collection Time: 12/06/20  9:53 PM  Result Value Ref Range   FIO2 28.00    pH, Ven 7.341 7.250 - 7.430   pCO2, Ven 45.2 44.0 - 60.0 mmHg   pO2, Ven 47.2 (H) 32.0 - 45.0 mmHg   Bicarbonate 23.8 20.0 - 28.0 mmol/L   Acid-base deficit 1.1 0.0 - 2.0 mmol/L   O2 Saturation 76.6 %   Patient temperature 37.0    Collection site VENOUS    Drawn by 2122    Sample type VENOUS     Comment: Performed at [facility], [place] [address] [place], [place] [address]  Prepare RBC (crossmatch)     Status: None   Collection Time: 12/06/20 10:47 PM  Result Value Ref Range   Order Confirmation      ORDER PROCESSED BY BLOOD BANK Performed at [facility], [place] [address], [place], [place] [address]   Glucose, capillary     Status: Abnormal   Collection Time: 12/07/20  7:47 AM  Result Value Ref Range   Glucose-Capillary 261 (H) 70 - 99 mg/dL    Comment: Glucose reference range applies only to samples taken after fasting for at least 8 hours.  TSH     Status: None   Collection Time: 12/07/20  8:26 AM  Result Value Ref Range   TSH 1.190 0.350 - 4.500 uIU/mL    Comment: Performed by a 3rd Generation assay with a functional sensitivity of  <=0.01 uIU/mL. Performed at [facility], [place] [address], [place], [place] [address]   CBC     Status: Abnormal   Collection Time: 12/07/20  8:26 AM  Result Value Ref Range   WBC 10.5 4.0 - 10.5 K/uL   RBC 3.39 (L) 3.87 - 5.11 MIL/uL   Hemoglobin 9.5 (L) 12.0 - 15.0 g/dL    Comment: REPEATED TO VERIFY POST TRANSFUSION SPECIMEN    HCT 28.8 (L) 36.0 - 46.0 %   MCV 85.0 80.0 - 100.0 fL    Comment: POST TRANSFUSION SPECIMEN REPEATED TO VERIFY    MCH 28.0 26.0 - 34.0 pg   MCHC 33.0 30.0 - 36.0 g/dL   RDW 16.2 (H) 11.5 - 15.5 %   Platelets 131 (L) 150 - 400 K/uL   nRBC 0.0 0.0 - 0.2 %    Comment: Performed at [facility], [place] [address] [place], [place] [address]  Basic metabolic panel     Status: Abnormal   Collection Time:  12/07/20  8:26 AM  Result Value Ref Range   Sodium 131 (L) 135 - 145 mmol/L   Potassium 3.9 3.5 - 5.1 mmol/L   Chloride 99 98 - 111 mmol/L   CO2 22 22 - 32 mmol/L   Glucose, Bld 358 (H) 70 - 99 mg/dL    Comment: Glucose reference range applies only to samples taken after fasting for at least 8 hours.   BUN 41 (H) 8 - 23 mg/dL   Creatinine, Ser 1.22 (H) 0.44 - 1.00 mg/dL   Calcium 7.9 (L) 8.9 - 10.3 mg/dL   GFR, Estimated 48 (L) >60 mL/min    Comment: (NOTE) Calculated using the CKD-EPI Creatinine Equation (2021)    Anion gap 10 5 - 15    Comment: Performed at [facility] [address] [place], [place] [address]  Magnesium     Status: None   Collection Time: 12/07/20  8:26 AM  Result Value Ref Range   Magnesium 1.7 1.7 - 2.4 mg/dL    Comment: Performed at [facility] [address] [place], [place] [address]  Glucose, capillary     Status: Abnormal   Collection Time: 12/07/20 11:11 AM  Result Value Ref Range   Glucose-Capillary 317 (H) 70 - 99 mg/dL    Comment: Glucose reference range applies only to samples taken after fasting for at least 8 hours.  Culture, blood (routine x 2)     Status: None (Preliminary result)    Collection Time: 12/07/20  1:17 PM   Specimen: BLOOD  Result Value Ref Range   Specimen Description BLOOD RIGHT ANTECUBITAL    Special Requests      BOTTLES DRAWN AEROBIC AND ANAEROBIC Blood Culture adequate volume   Culture      NO GROWTH < 24 HOURS Performed at [facility], [place] [address], [place], [place] [address]    Report Status PENDING   Culture, blood (routine x 2)     Status: None (Preliminary result)   Collection Time: 12/07/20  1:17 PM   Specimen: BLOOD  Result Value Ref Range   Specimen Description BLOOD BLOOD LEFT HAND    Special Requests      BOTTLES DRAWN AEROBIC AND ANAEROBIC Blood Culture adequate volume   Culture      NO GROWTH < 24 HOURS Performed at [facility], [place] [address] [place], [place] [address]    Report Status PENDING   Urinalysis, Routine w reflex microscopic     Status: Abnormal   Collection Time: 12/07/20  1:37 PM  Result Value Ref Range   Color, Urine YELLOW YELLOW   APPearance CLEAR CLEAR   Specific Gravity, Urine 1.020 1.005 - 1.030   pH 5.0 5.0 - 8.0   Glucose, UA >=500 (A) NEGATIVE mg/dL   Hgb urine dipstick SMALL (A) NEGATIVE   Bilirubin Urine NEGATIVE NEGATIVE   Ketones, ur 5 (A) NEGATIVE mg/dL   Protein, ur NEGATIVE NEGATIVE mg/dL   Nitrite NEGATIVE NEGATIVE   Leukocytes,Ua SMALL (A) NEGATIVE   RBC / HPF 0-5 0 - 5 RBC/hpf   WBC, UA 11-20 0 - 5 WBC/hpf   Bacteria, UA NONE SEEN NONE SEEN   Squamous Epithelial / LPF 0-5 0 - 5   Mucus PRESENT    Budding Yeast PRESENT    Hyaline Casts, UA PRESENT     Comment: Performed at [facility], [place] [address] [place], [place] [address]  Glucose, capillary  Status: Abnormal   Collection Time: 12/07/20  6:27 PM  Result Value Ref Range   Glucose-Capillary 342 (H) 70 - 99 mg/dL    Comment: Glucose reference range applies only to samples taken after fasting for at least 8 hours.  Glucose, capillary     Status: Abnormal   Collection Time: 12/07/20 10:06 PM  Result Value  Ref Range   Glucose-Capillary 280 (H) 70 - 99 mg/dL    Comment: Glucose reference range applies only to samples taken after fasting for at least 8 hours.  Glucose, capillary     Status: Abnormal   Collection Time: 12/08/20  8:34 AM  Result Value Ref Range   Glucose-Capillary 282 (H) 70 - 99 mg/dL    Comment: Glucose reference range applies only to samples taken after fasting for at least 8 hours.    ECG   N/A  Telemetry   Sinus rhythm - Personally Reviewed  Radiology    CT HEAD WO CONTRAST  Result Date: 12/06/2020 CLINICAL DATA:  Change in mental status. EXAM: CT HEAD WITHOUT CONTRAST TECHNIQUE: Contiguous axial images were obtained from the base of the skull through the vertex without intravenous contrast. COMPARISON:  None. FINDINGS: Brain: Normal for age atrophy. No intracranial hemorrhage, mass effect, or midline shift. No hydrocephalus. The basilar cisterns are patent. No evidence of territorial infarct or acute ischemia. No extra-axial or intracranial fluid collection. Vascular: Atherosclerosis of skullbase vasculature without hyperdense vessel or abnormal calcification. Skull: No fracture or focal lesion. Sinuses/Orbits: Paranasal sinuses and mastoid air cells are clear. The visualized orbits are unremarkable. Other: None. IMPRESSION: Unremarkable noncontrast head CT for age. Electronically Signed   By: Keith Rake M.D.   On: 12/06/2020 23:28   DG CHEST PORT 1 VIEW  Result Date: 12/06/2020 CLINICAL DATA:  Altered mental status EXAM: PORTABLE CHEST 1 VIEW COMPARISON:  December 04, 2020 FINDINGS: Cardiac enlargement with central vascular prominence. Aortic atherosclerosis. Low lung volumes with bibasilar atelectasis. No overt pulmonary edema. Partially visualized anterior spinal fusion hardware. IMPRESSION: Cardiac enlargement with central vascular prominence. Electronically Signed   By: Dahlia Bailiff MD   On: 12/06/2020 21:48   ECHOCARDIOGRAM COMPLETE  Result Date: 12/07/2020     ECHOCARDIOGRAM REPORT   Patient Name:   GERTHA HUTMACHER Date of Exam: 12/07/2020 Medical Rec #:  [id number]         Height:       65.0 in Accession #:    [id number]        Weight:       184.5 lb Date of Birth:  [birth year]         BSA:          1.912 m Patient Age:    48 years          BP:           121/62 mmHg Patient Gender: F                 HR:           83 bpm. Exam Location:  Inpatient Procedure: 2D Echo Indications:    atrial fibrillation  History:        Patient has prior history of Echocardiogram examinations, most                 recent 06/23/2007. Risk Factors:Sleep Apnea, Diabetes and                 Dyslipidemia.  Sonographer:    Ander Purpura  [place] Referring Phys: [id number] [place] Comments: Pain from probe pressure and Image acquisition challenging due to patient body habitus. IMPRESSIONS  1. Left ventricular ejection fraction, by estimation, is 60 to 65%. The left ventricle has normal function. The left ventricle has no regional wall motion abnormalities. Left ventricular diastolic parameters were normal.  2. Right ventricular systolic function is normal. The right ventricular size is mildly enlarged. There is mildly elevated pulmonary artery systolic pressure. The estimated right ventricular systolic pressure is 0000000 mmHg.  3. The mitral valve is degenerative. No evidence of mitral valve regurgitation. No evidence of mitral stenosis. Moderate mitral annular calcification.  4. The aortic valve is tricuspid. There is mild calcification of the aortic valve. There is mild thickening of the aortic valve. Aortic valve regurgitation is not visualized. No aortic stenosis is present.  5. The inferior vena cava is normal in size with greater than 50% respiratory variability, suggesting right atrial pressure of 3 mmHg. FINDINGS  Left Ventricle: Left ventricular ejection fraction, by estimation, is 60 to 65%. The left ventricle has normal function. The left ventricle has no regional wall  motion abnormalities. The left ventricular internal cavity size was normal in size. There is  no left ventricular hypertrophy. Left ventricular diastolic parameters were normal. Right Ventricle: The right ventricular size is mildly enlarged. No increase in right ventricular wall thickness. Right ventricular systolic function is normal. There is mildly elevated pulmonary artery systolic pressure. The tricuspid regurgitant velocity is 3.16 m/s, and with an assumed right atrial pressure of 3 mmHg, the estimated right ventricular systolic pressure is 0000000 mmHg. Left Atrium: Left atrial size was normal in size. Right Atrium: Right atrial size was normal in size. Pericardium: Trivial pericardial effusion is present. Presence of pericardial fat pad. Mitral Valve: The mitral valve is degenerative in appearance. Moderate mitral annular calcification. No evidence of mitral valve regurgitation. No evidence of mitral valve stenosis. Tricuspid Valve: The tricuspid valve is grossly normal. Tricuspid valve regurgitation is mild . No evidence of tricuspid stenosis. Aortic Valve: The aortic valve is tricuspid. There is mild calcification of the aortic valve. There is mild thickening of the aortic valve. Aortic valve regurgitation is not visualized. No aortic stenosis is present. Pulmonic Valve: The pulmonic valve was grossly normal. Pulmonic valve regurgitation is not visualized. No evidence of pulmonic stenosis. Aorta: The aortic root and ascending aorta are structurally normal, with no evidence of dilitation. Venous: The inferior vena cava is normal in size with greater than 50% respiratory variability, suggesting right atrial pressure of 3 mmHg. IAS/Shunts: The atrial septum is grossly normal.  LEFT VENTRICLE PLAX 2D LVIDd:         4.40 cm  Diastology LVIDs:         2.80 cm  LV e' medial:    9.46 cm/s LV PW:         0.90 cm  LV E/e' medial:  12.3 LV IVS:        1.00 cm  LV e' lateral:   7.62 cm/s LVOT diam:     1.80 cm  LV E/e'  lateral: 15.2 LV SV:         71 LV SV Index:   37 LVOT Area:     2.54 cm  RIGHT VENTRICLE             IVC RV S prime:     16.00 cm/s  IVC diam: 2.20 cm TAPSE (M-mode): 2.4 cm LEFT ATRIUM  Index       RIGHT ATRIUM           Index LA diam:        4.00 cm 2.09 cm/m  RA Area:     14.30 cm LA Vol (A2C):   52.4 ml 27.41 ml/m RA Volume:   36.00 ml  18.83 ml/m LA Vol (A4C):   57.1 ml 29.87 ml/m LA Biplane Vol: 59.7 ml 31.23 ml/m  AORTIC VALVE LVOT Vmax:   147.00 cm/s LVOT Vmean:  100.000 cm/s LVOT VTI:    0.278 m  AORTA Ao Root diam: 3.10 cm Ao Asc diam:  3.70 cm MITRAL VALVE                TRICUSPID VALVE MV Area (PHT): 3.99 cm     TR Peak grad:   39.9 mmHg MV Decel Time: 190 msec     TR Vmax:        316.00 cm/s MV E velocity: 116.00 cm/s MV A velocity: 119.00 cm/s  SHUNTS MV E/A ratio:  0.97         Systemic VTI:  0.28 m                             Systemic Diam: 1.80 cm Eleonore Chiquito MD Electronically signed by Eleonore Chiquito MD Signature Date/Time: 12/07/2020/2:18:31 PM    Final     Cardiac Studies   See above  Assessment   1. Principal Problem: 2.   Subcapital fracture of left femur ([place]) 3. Active Problems: 4.   Hyperlipidemia 5.   Anxiety and depression 6.   OSA (obstructive sleep apnea) 7.   Coronary artery calcification seen on computed tomography 8.   Type 2 diabetes mellitus ([place]) 9.   Leukocytosis 10.   Fall at home, initial encounter 11.   Hypotension 12.   Acute metabolic encephalopathy 13.   Paroxysmal atrial fibrillation (H[facility]) 14.   Acute blood loss anemia 15.   Plan   1. Spontaneously converted back to sinus. First documented episode of afib. CHADSVASC score of 5 - recommend stop Plavix and switch to Eliquis 5 mg BID - it appears that hemoglobin has been stable post transfusion. Will consolidate metoprolol to Toprol XL 100 mg daily this am. BP is rising today- would decrease midodrine to BID and monitor BP, continue to wean. LVEF is normal. If bp becomes elevated  off midodrine, then would consider adding back amlodipine 2.5 mg QHS.  No further suggestions at this time.  [facility] will sign off.   Medication Recommendations:  Stop Plavix, Eliquis 5 mg BID, Toprol XL 100 mg daily, amlodipine 2.5 mg QHS (if bp necessitates) Other recommendations (labs, testing, etc):  none Follow up as an outpatient:  [facility] or APP in 2-4 weeks.  Time Spent Directly with Patient:  I have spent a total of 25 minutes with the patient reviewing hospital notes, telemetry, EKGs, labs and examining the patient as well as establishing an assessment and plan that was discussed personally with the patient.  > 50% of time was spent in direct patient care.  Length of Stay:  LOS: 4 days   Pixie Casino, MD, [facility], [place] Director of the [facility] Lip[facility]  Card[facility]ovascular [facility] Diplomate of the American Board of Clinical Lipidology Attending Cardiologist  Direct Dial: [phone]  Fax: [phone]  Website:  [email or web address]  Nadean Corwin  Harrel Ferrone 12/08/2020, 9:44 AM

## 2020-12-09 ENCOUNTER — Inpatient Hospital Stay (HOSPITAL_COMMUNITY): Payer: Medicare Other

## 2020-12-09 DIAGNOSIS — S72012P Unspecified intracapsular fracture of left femur, subsequent encounter for closed fracture with malunion: Secondary | ICD-10-CM

## 2020-12-09 DIAGNOSIS — G9341 Metabolic encephalopathy: Secondary | ICD-10-CM | POA: Diagnosis not present

## 2020-12-09 DIAGNOSIS — I480 Paroxysmal atrial fibrillation: Secondary | ICD-10-CM | POA: Diagnosis not present

## 2020-12-09 DIAGNOSIS — D62 Acute posthemorrhagic anemia: Secondary | ICD-10-CM | POA: Diagnosis not present

## 2020-12-09 DIAGNOSIS — F419 Anxiety disorder, unspecified: Secondary | ICD-10-CM | POA: Diagnosis not present

## 2020-12-09 LAB — GLUCOSE, CAPILLARY
Glucose-Capillary: 220 mg/dL — ABNORMAL HIGH (ref 70–99)
Glucose-Capillary: 305 mg/dL — ABNORMAL HIGH (ref 70–99)
Glucose-Capillary: 242 mg/dL — ABNORMAL HIGH (ref 70–99)
Glucose-Capillary: 231 mg/dL — ABNORMAL HIGH (ref 70–99)

## 2020-12-09 LAB — BASIC METABOLIC PANEL
Anion gap: 9 (ref 5–15)
BUN: 19 mg/dL (ref 8–23)
CO2: 23 mmol/L (ref 22–32)
Calcium: 8.3 mg/dL — ABNORMAL LOW (ref 8.9–10.3)
Chloride: 99 mmol/L (ref 98–111)
Creatinine, Ser: 0.74 mg/dL (ref 0.44–1.00)
GFR, Estimated: >60 mL/min (ref >60)
Glucose, Bld: 249 mg/dL — ABNORMAL HIGH (ref 70–99)
Potassium: 3.6 mmol/L (ref 3.5–5.1)
Sodium: 131 mmol/L — ABNORMAL LOW (ref 135–145)

## 2020-12-09 LAB — CBC
HCT: 27.8 % — ABNORMAL LOW (ref 36.0–46.0)
Hemoglobin: 9.5 g/dL — ABNORMAL LOW (ref 12.0–15.0)
MCH: 28.6 pg (ref 26.0–34.0)
MCHC: 34.2 g/dL (ref 30.0–36.0)
MCV: 83.7 fL (ref 80.0–100.0)
Platelets: 167 10*3/uL (ref 150–400)
RBC: 3.32 MIL/uL — ABNORMAL LOW (ref 3.87–5.11)
RDW: 16.8 % — ABNORMAL HIGH (ref 11.5–15.5)
WBC: 9.4 10*3/uL (ref 4.0–10.5)
nRBC: 0 % (ref 0.0–0.2)

## 2020-12-09 LAB — OCCULT BLOOD X 1 CARD TO LAB, STOOL: Fecal Occult Bld: POSITIVE — AB

## 2020-12-09 LAB — BRAIN NATRIURETIC PEPTIDE: B Natriuretic Peptide: 530.1 pg/mL — ABNORMAL HIGH (ref 0.0–100.0)

## 2020-12-09 MED ORDER — INSULIN ASPART 100 UNIT/ML IJ SOLN
8.0000 [IU] | Freq: Three times a day (TID) | INTRAMUSCULAR | Status: DC
  Administered 2020-12-09 – 2020-12-10 (×5): 8 [IU] via SUBCUTANEOUS

## 2020-12-09 MED ORDER — FUROSEMIDE 10 MG/ML IJ SOLN
20.0000 mg | Freq: Once | INTRAMUSCULAR | Status: AC
  Administered 2020-12-09: 20 mg via INTRAVENOUS
  Filled 2020-12-09: qty 2

## 2020-12-09 MED ORDER — GLUCERNA SHAKE PO LIQD
237.0000 mL | Freq: Two times a day (BID) | ORAL | Status: DC
  Administered 2020-12-11 – 2020-12-12 (×2): 237 mL via ORAL

## 2020-12-09 NOTE — TOC Initial Note (Signed)
Transition of Care [facility]) - Initial/Assessment Note    Patient Details  Name: Alexis [name] MRN: [id number] Date of Birth: 1950/08/23  Transition of Care [facility]) CM/SW Contact:    Oretha Milch, LCSW Phone Number: 12/09/2020, 11:06 AM  Clinical Narrative:    CSW met with patient and spouse to discuss PT recommendation for SNF. CSW introduced self and role to patient. CSW reviewed PT recommendation and provided a brief overview on SNFs. Patient initially had concerns it was long-term but CSW clarified it was for short term typically up to 30-days. CSW notes patient's adult daughter as put on speaker phone and CSW reviewed information. CSW provided ratings list to spouse, patient, and daughter. CSW noted family is amenable to [facility] and CSW informed them of how the referral process worked and how a specific facility is never guaranteed. CSW noted for the time people family and patient are open to a referral to [facility] and will review the ratings list to discuss other options. CSW will continue to follow at this time.                 Expected Discharge Plan: Skilled Nursing Facility Barriers to Discharge: SNF Pending bed offer   Patient Goals and CMS Choice Patient states their goals for this hospitalization and ongoing recovery are:: "I don't want to be in a nursing home for a long time." CMS Medicare.gov Compare Post Acute Care list provided to:: Patient Choice offered to / list presented to : [place]  Expected Discharge Plan and Services Expected Discharge Plan: [place] Acute Care Choice: [place]                                        Prior Living Arrangements/Services   Lives with:: Spouse Patient language and need for interpreter reviewed:: Yes Do you feel safe going back to the place where you live?: Yes      Need for Family Participation in Patient Care: Yes (Comment) Care giver support system in  place?: Yes (comment)   Criminal Activity/Legal Involvement Pertinent to Current Situation/Hospitalization: No - Comment as needed  Activities of Daily Living      Permission Sought/Granted Permission sought to share information with : Facility Art therapist granted to share information with : Yes, Verbal Permission Granted              Emotional Assessment Appearance:: Appears stated age Attitude/Demeanor/Rapport: Engaged Affect (typically observed): Calm,Appropriate Orientation: : Oriented to Self,Oriented to Place,Oriented to  Time,Oriented to Situation Alcohol / Substance Use: Not Applicable Psych Involvement: No (comment)  Admission diagnosis:  Fall [W19.XXXA] Alkaline phosphatase elevation [R74.8] Closed subcapital fracture of left femur, initial encounter ([place]) [S72.012A] Fall in home, initial encounter [W19.XXXA, F29.244] Subcapital fracture of left femur ([place]) [S72.012A] Pain in left thigh [M79.652] Patient Active Problem List   Diagnosis Date Noted  . Hypotension 12/07/2020  . Acute metabolic encephalopathy 62/86/3817  . Paroxysmal atrial fibrillation (HCC)   . Acute blood loss anemia   . Subcapital fracture of left femur ([place]) 12/04/2020  . Leukocytosis 12/04/2020  . Fall at home, initial encounter 12/04/2020  . Diabetic peripheral neuropathy ([place]) 10/20/2018  . Cervical myelopathy ([place]) 05/07/2018  . Type 2 diabetes mellitus ([place]) 02/18/2018  . Memory difficulties 09/18/2017  . Coronary artery calcification seen on computed tomography 04/09/2017  . Word finding  difficulty 09/05/2016  . Cervicogenic headache 09/05/2016  . Palpitation 08/22/2016  . Bilateral leg edema 08/22/2016  . Obesity (BMI 30-39.9) 05/25/2014  . Precordial chest pain 05/25/2014  . Migraine without aura, without mention of intractable migraine without mention of status migrainosus 03/18/2013  . Chest pain 01/29/2013  . Constipation 02/24/2012  . DM (diabetes  mellitus), type 2, uncontrolled ([place]) 08/24/2010  . Hyperlipidemia 08/24/2010  . ANXIETY 08/24/2010  . Anxiety and depression 08/24/2010  . Essential hypertension 08/24/2010  . GERD 08/24/2010  . IRRITABLE BOWEL SYNDROME 08/24/2010  . FATTY LIVER DISEASE 08/24/2010  . ARTHRITIS 08/24/2010  . Fibromyalgia 08/24/2010  . OSA (obstructive sleep apnea) 08/24/2010  . ABDOMINAL PAIN-RUQ 08/24/2010  . UTERINE CANCER, HX OF 08/24/2010  . GASTRITIS, HX OF 08/24/2010   PCP:  Deland Pretty, MD Pharmacy:   [id number] [place], [place] [id number]: [phone]: [phone]    Social Determinants of Health (SDOH) Interventions    Readmission Risk Interventions No flowsheet data found.

## 2020-12-09 NOTE — Progress Notes (Signed)
DAILY PROGRESS NOTE   Patient Name: Alexis [name] Date of Encounter: 12/09/2020 Cardiologist: Pixie Casino, MD  Chief Complaint   Hip pain  Patient Profile   Alexis [name] is a 70 y.o. female with HLD,HTN,DM2,fatty liver disease, IBS,CAD, OSAand GERD who is hospitalized for L hip fx now s/p L THA who cardiology is consulted for post op AF.   Subjective   Unfortunately went back into afib with RVR yesterday just prior to PT - seen by Dr. Burt Knack who recommended IV Amiodarone - now back in sinus rhythm.  Objective   Vitals:   12/08/20 1926 12/08/20 2306 12/09/20 0310 12/09/20 0742  BP:  135/60 (!) 148/70 131/63  Pulse:  77 81 76  Resp: 20 20 20 15   Temp: 99 F (37.2 C) 99.5 F (37.5 C) 98.9 F (37.2 C) 98.2 F (36.8 C)  TempSrc: Oral Oral Oral Oral  SpO2:  92% 93% 96%  Weight:        Intake/Output Summary (Last 24 hours) at 12/09/2020 1113 Last data filed at 12/09/2020 0845 Gross per 24 hour  Intake 360 ml  Output 1700 ml  Net -1340 ml   Filed Weights   12/06/20 1400  Weight: 83.7 kg    Physical Exam   General appearance: alert and no distress Neck: no carotid bruit, no JVD and thyroid not enlarged, symmetric, no tenderness/mass/nodules Lungs: clear to auscultation bilaterally Heart: regular rate and rhythm, S1, S2 normal, no murmur, click, rub or gallop Abdomen: soft, non-tender; bowel sounds normal; no masses,  no organomegaly Extremities: extremities normal, atraumatic, no cyanosis or edema Pulses: 2+ and symmetric Skin: Skin color, texture, turgor normal. No rashes or lesions Neurologic: Grossly normal Psych: Pleasant  Inpatient Medications    Scheduled Meds: . apixaban  5 mg Oral BID  . atorvastatin  20 mg Oral QHS  . Chlorhexidine Gluconate Cloth  6 each Topical Daily  . docusate sodium  100 mg Oral BID  . feeding supplement  237 mL Oral BID BM  . insulin aspart  0-15 Units Subcutaneous TID WC  . insulin aspart  5 Units  Subcutaneous TID WC  . insulin glargine  35 Units Subcutaneous Daily  . insulin glargine  5 Units Subcutaneous Once  . metoprolol succinate  100 mg Oral Daily  . midodrine  5 mg Oral BID WC  . pantoprazole  40 mg Oral BID  . polyethylene glycol  17 g Oral BID  . pregabalin  50 mg Oral BID  . tamsulosin  0.4 mg Oral QHS  . venlafaxine XR  150 mg Oral BID    Continuous Infusions: . amiodarone 30 mg/hr (12/09/20 0158)    PRN Meds: acetaminophen, ALPRAZolam, diphenhydrAMINE, menthol-cetylpyridinium **OR** phenol, metoCLOPramide **OR** metoCLOPramide (REGLAN) injection, ondansetron **OR** ondansetron (ZOFRAN) IV, SUMAtriptan   Labs   Results for orders placed or performed during the hospital encounter of 12/04/20 (from the past 48 hour(s))  Culture, Urine     Status: None   Collection Time: 12/07/20 11:59 AM   Specimen: Urine, Random  Result Value Ref Range   Specimen Description URINE, RANDOM    Special Requests NONE    Culture      NO GROWTH Performed at [facility], [address], [place], [place] [address]    Report Status 12/08/2020 FINAL   Culture, blood (routine x 2)     Status: None (Preliminary result)   Collection Time: 12/07/20  1:17 PM   Specimen: BLOOD  Result  Value Ref Range   Specimen Description BLOOD RIGHT ANTECUBITAL    Special Requests      BOTTLES DRAWN AEROBIC AND ANAEROBIC Blood Culture adequate volume   Culture      NO GROWTH 2 DAYS Performed at [facility], [place] [address] [place], [place] [address]    Report Status PENDING   Culture, blood (routine x 2)     Status: None (Preliminary result)   Collection Time: 12/07/20  1:17 PM   Specimen: BLOOD  Result Value Ref Range   Specimen Description BLOOD BLOOD LEFT HAND    Special Requests      BOTTLES DRAWN AEROBIC AND ANAEROBIC Blood Culture adequate volume   Culture      NO GROWTH 2 DAYS Performed at [facility], [place] [address], [place], [place] [address]    Report Status  PENDING   Urinalysis, Routine w reflex microscopic     Status: Abnormal   Collection Time: 12/07/20  1:37 PM  Result Value Ref Range   Color, Urine YELLOW YELLOW   APPearance CLEAR CLEAR   Specific Gravity, Urine 1.020 1.005 - 1.030   pH 5.0 5.0 - 8.0   Glucose, UA >=500 (A) NEGATIVE mg/dL   Hgb urine dipstick SMALL (A) NEGATIVE   Bilirubin Urine NEGATIVE NEGATIVE   Ketones, ur 5 (A) NEGATIVE mg/dL   Protein, ur NEGATIVE NEGATIVE mg/dL   Nitrite NEGATIVE NEGATIVE   Leukocytes,Ua SMALL (A) NEGATIVE   RBC / HPF 0-5 0 - 5 RBC/hpf   WBC, UA 11-20 0 - 5 WBC/hpf   Bacteria, UA NONE SEEN NONE SEEN   Squamous Epithelial / LPF 0-5 0 - 5   Mucus PRESENT    Budding Yeast PRESENT    Hyaline Casts, UA PRESENT     Comment: Performed at [facility], [place] [address], [place], [place] [address]  Glucose, capillary     Status: Abnormal   Collection Time: 12/07/20  6:27 PM  Result Value Ref Range   Glucose-Capillary 342 (H) 70 - 99 mg/dL    Comment: Glucose reference range applies only to samples taken after fasting for at least 8 hours.  Glucose, capillary     Status: Abnormal   Collection Time: 12/07/20 10:06 PM  Result Value Ref Range   Glucose-Capillary 280 (H) 70 - 99 mg/dL    Comment: Glucose reference range applies only to samples taken after fasting for at least 8 hours.  Glucose, capillary     Status: Abnormal   Collection Time: 12/08/20  8:34 AM  Result Value Ref Range   Glucose-Capillary 282 (H) 70 - 99 mg/dL    Comment: Glucose reference range applies only to samples taken after fasting for at least 8 hours.  Glucose, capillary     Status: Abnormal   Collection Time: 12/08/20 11:57 AM  Result Value Ref Range   Glucose-Capillary 342 (H) 70 - 99 mg/dL    Comment: Glucose reference range applies only to samples taken after fasting for at least 8 hours.  Basic metabolic panel     Status: Abnormal   Collection Time: 12/08/20 12:33 PM  Result Value Ref Range   Sodium 131 (L)  135 - 145 mmol/L   Potassium 3.5 3.5 - 5.1 mmol/L   Chloride 101 98 - 111 mmol/L   CO2 22 22 - 32 mmol/L   Glucose, Bld 310 (H) 70 - 99 mg/dL    Comment: Glucose reference range applies only to samples taken  after fasting for at least 8 hours.   BUN 21 8 - 23 mg/dL   Creatinine, Ser 0.73 0.44 - 1.00 mg/dL   Calcium 8.2 (L) 8.9 - 10.3 mg/dL   GFR, Estimated >60 >60 mL/min    Comment: (NOTE) Calculated using the CKD-EPI Creatinine Equation (2021)    Anion gap 8 5 - 15    Comment: Performed at [facility] [address], [place], [place] [address]  CBC     Status: Abnormal   Collection Time: 12/08/20 12:33 PM  Result Value Ref Range   WBC 10.0 4.0 - 10.5 K/uL   RBC 3.43 (L) 3.87 - 5.11 MIL/uL   Hemoglobin 9.7 (L) 12.0 - 15.0 g/dL   HCT 28.5 (L) 36.0 - 46.0 %   MCV 83.1 80.0 - 100.0 fL   MCH 28.3 26.0 - 34.0 pg   MCHC 34.0 30.0 - 36.0 g/dL   RDW 16.3 (H) 11.5 - 15.5 %   Platelets 154 150 - 400 K/uL   nRBC 0.0 0.0 - 0.2 %    Comment: Performed at [facility] [address] [place], [place] [address]  Glucose, capillary     Status: Abnormal   Collection Time: 12/08/20  4:34 PM  Result Value Ref Range   Glucose-Capillary 201 (H) 70 - 99 mg/dL    Comment: Glucose reference range applies only to samples taken after fasting for at least 8 hours.  Magnesium     Status: None   Collection Time: 12/08/20  5:52 PM  Result Value Ref Range   Magnesium 2.1 1.7 - 2.4 mg/dL    Comment: Performed at [facility], [place] [address], [place], [place] [address]  Glucose, capillary     Status: Abnormal   Collection Time: 12/08/20  9:20 PM  Result Value Ref Range   Glucose-Capillary 261 (H) 70 - 99 mg/dL    Comment: Glucose reference range applies only to samples taken after fasting for at least 8 hours.  Basic metabolic panel     Status: Abnormal   Collection Time: 12/09/20  3:37 AM  Result Value Ref Range   Sodium 131 (L) 135 - 145 mmol/L   Potassium 3.6 3.5 - 5.1 mmol/L    Chloride 99 98 - 111 mmol/L   CO2 23 22 - 32 mmol/L   Glucose, Bld 249 (H) 70 - 99 mg/dL    Comment: Glucose reference range applies only to samples taken after fasting for at least 8 hours.   BUN 19 8 - 23 mg/dL   Creatinine, Ser 0.74 0.44 - 1.00 mg/dL   Calcium 8.3 (L) 8.9 - 10.3 mg/dL   GFR, Estimated >60 >60 mL/min    Comment: (NOTE) Calculated using the CKD-EPI Creatinine Equation (2021)    Anion gap 9 5 - 15    Comment: Performed at [facility] [address], [place], [place] [address]  CBC     Status: Abnormal   Collection Time: 12/09/20  3:37 AM  Result Value Ref Range   WBC 9.4 4.0 - 10.5 K/uL   RBC 3.32 (L) 3.87 - 5.11 MIL/uL   Hemoglobin 9.5 (L) 12.0 - 15.0 g/dL   HCT 27.8 (L) 36.0 - 46.0 %   MCV 83.7 80.0 - 100.0 fL   MCH 28.6 26.0 - 34.0 pg   MCHC 34.2 30.0 - 36.0 g/dL   RDW 16.8 (H) 11.5 - 15.5 %   Platelets 167 150 - 400 K/uL  nRBC 0.0 0.0 - 0.2 %    Comment: Performed at [facility], [place] [address], [place], [place] [address]  Brain natriuretic peptide     Status: Abnormal   Collection Time: 12/09/20  3:37 AM  Result Value Ref Range   B Natriuretic Peptide 530.1 (H) 0.0 - 100.0 pg/mL    Comment: Performed at [facility] [address], [place], [place] [address]  Glucose, capillary     Status: Abnormal   Collection Time: 12/09/20  7:39 AM  Result Value Ref Range   Glucose-Capillary 220 (H) 70 - 99 mg/dL    Comment: Glucose reference range applies only to samples taken after fasting for at least 8 hours.    ECG   N/A  Telemetry   Sinus rhythm - Personally Reviewed  Radiology    ECHOCARDIOGRAM COMPLETE  Result Date: 12/07/2020    ECHOCARDIOGRAM REPORT   Patient Name:   Alexis [name] Date of Exam: 12/07/2020 Medical Rec #:  [id number]         Height:       65.0 in Accession #:    [id number]        Weight:       184.5 lb Date of Birth:  02-03-1951         BSA:          1.912 m Patient Age:    63 years          BP:            121/62 mmHg Patient Gender: F                 HR:           83 bpm. Exam Location:  Inpatient Procedure: 2D Echo Indications:    atrial fibrillation  History:        Patient has prior history of Echocardiogram examinations, most                 recent 06/23/2007. Risk Factors:Sleep Apnea, Diabetes and                 Dyslipidemia.  Sonographer:    Johny Chess Referring Phys: [id number] DAVID MANUEL ORTIZ  Sonographer Comments: Pain from probe pressure and Image acquisition challenging due to patient body habitus. IMPRESSIONS  1. Left ventricular ejection fraction, by estimation, is 60 to 65%. The left ventricle has normal function. The left ventricle has no regional wall motion abnormalities. Left ventricular diastolic parameters were normal.  2. Right ventricular systolic function is normal. The right ventricular size is mildly enlarged. There is mildly elevated pulmonary artery systolic pressure. The estimated right ventricular systolic pressure is 0000000 mmHg.  3. The mitral valve is degenerative. No evidence of mitral valve regurgitation. No evidence of mitral stenosis. Moderate mitral annular calcification.  4. The aortic valve is tricuspid. There is mild calcification of the aortic valve. There is mild thickening of the aortic valve. Aortic valve regurgitation is not visualized. No aortic stenosis is present.  5. The inferior vena cava is normal in size with greater than 50% respiratory variability, suggesting right atrial pressure of 3 mmHg. FINDINGS  Left Ventricle: Left ventricular ejection fraction, by estimation, is 60 to 65%. The left ventricle has normal function. The left ventricle has no regional wall motion abnormalities. The left ventricular internal cavity size was normal in size. There is  no left ventricular hypertrophy. Left ventricular diastolic parameters were normal. Right Ventricle: The right  ventricular size is mildly enlarged. No increase in right ventricular wall thickness. Right  ventricular systolic function is normal. There is mildly elevated pulmonary artery systolic pressure. The tricuspid regurgitant velocity is 3.16 m/s, and with an assumed right atrial pressure of 3 mmHg, the estimated right ventricular systolic pressure is 0000000 mmHg. Left Atrium: Left atrial size was normal in size. Right Atrium: Right atrial size was normal in size. Pericardium: Trivial pericardial effusion is present. Presence of pericardial fat pad. Mitral Valve: The mitral valve is degenerative in appearance. Moderate mitral annular calcification. No evidence of mitral valve regurgitation. No evidence of mitral valve stenosis. Tricuspid Valve: The tricuspid valve is grossly normal. Tricuspid valve regurgitation is mild . No evidence of tricuspid stenosis. Aortic Valve: The aortic valve is tricuspid. There is mild calcification of the aortic valve. There is mild thickening of the aortic valve. Aortic valve regurgitation is not visualized. No aortic stenosis is present. Pulmonic Valve: The pulmonic valve was grossly normal. Pulmonic valve regurgitation is not visualized. No evidence of pulmonic stenosis. Aorta: The aortic root and ascending aorta are structurally normal, with no evidence of dilitation. Venous: The inferior vena cava is normal in size with greater than 50% respiratory variability, suggesting right atrial pressure of 3 mmHg. IAS/Shunts: The atrial septum is grossly normal.  LEFT VENTRICLE PLAX 2D LVIDd:         4.40 cm  Diastology LVIDs:         2.80 cm  LV e' medial:    9.46 cm/s LV PW:         0.90 cm  LV E/e' medial:  12.3 LV IVS:        1.00 cm  LV e' lateral:   7.62 cm/s LVOT diam:     1.80 cm  LV E/e' lateral: 15.2 LV SV:         71 LV SV Index:   37 LVOT Area:     2.54 cm  RIGHT VENTRICLE             IVC RV S prime:     16.00 cm/s  IVC diam: 2.20 cm TAPSE (M-mode): 2.4 cm LEFT ATRIUM             Index       RIGHT ATRIUM           Index LA diam:        4.00 cm 2.09 cm/m  RA Area:     14.30  cm LA Vol (A2C):   52.4 ml 27.41 ml/m RA Volume:   36.00 ml  18.83 ml/m LA Vol (A4C):   57.1 ml 29.87 ml/m LA Biplane Vol: 59.7 ml 31.23 ml/m  AORTIC VALVE LVOT Vmax:   147.00 cm/s LVOT Vmean:  100.000 cm/s LVOT VTI:    0.278 m  AORTA Ao Root diam: 3.10 cm Ao Asc diam:  3.70 cm MITRAL VALVE                TRICUSPID VALVE MV Area (PHT): 3.99 cm     TR Peak grad:   39.9 mmHg MV Decel Time: 190 msec     TR Vmax:        316.00 cm/s MV E velocity: 116.00 cm/s MV A velocity: 119.00 cm/s  SHUNTS MV E/A ratio:  0.97         Systemic VTI:  0.28 m  Systemic Diam: 1.80 cm Eleonore Chiquito MD Electronically signed by Eleonore Chiquito MD Signature Date/Time: 12/07/2020/2:18:31 PM    Final    VAS [place] UPPER EXTREMITY VENOUS DUPLEX  Result Date: 12/08/2020 UPPER VENOUS STUDY  Patient Name:  Alexis [name]  Date of Exam:   12/08/2020 Medical Rec #: [id number]          Accession #:    [id number] Date of Birth: [birth year]          Patient Gender: F Patient Age:   70Y Exam Location:  [facility] Procedure:      VAS [place] UPPER EXTREMITY VENOUS DUPLEX Referring Phys: [id number] [facility] AMERY --------------------------------------------------------------------------------  Indications: Right hand swelling Comparison Study: No prior study Performing Technologist: Maudry Mayhew MHA, RDMS, RVT, RDCS  Examination Guidelines: A complete evaluation includes B-mode imaging, spectral Doppler, color Doppler, and power Doppler as needed of all accessible portions of each vessel. Bilateral testing is considered an integral part of a complete examination. Limited examinations for reoccurring indications may be performed as noted.  Right Findings: +----------+------------+---------+-----------+----------+-------------+ RIGHT     CompressiblePhasicitySpontaneousProperties   Summary    +----------+------------+---------+-----------+----------+-------------+ IJV           Full       Yes       Yes                             +----------+------------+---------+-----------+----------+-------------+ Subclavian    Full       Yes       Yes                            +----------+------------+---------+-----------+----------+-------------+ Axillary      Full       Yes       Yes              Rouleaux flow +----------+------------+---------+-----------+----------+-------------+ Brachial      Full       Yes       Yes              Rouleaux flow +----------+------------+---------+-----------+----------+-------------+ Radial        Full                                                +----------+------------+---------+-----------+----------+-------------+ Ulnar         Full                                                +----------+------------+---------+-----------+----------+-------------+ Cephalic      Full                                                +----------+------------+---------+-----------+----------+-------------+ Basilic       Full                                                +----------+------------+---------+-----------+----------+-------------+  Left  Findings: +----------+------------+---------+-----------+----------+-------+ LEFT      CompressiblePhasicitySpontaneousPropertiesSummary +----------+------------+---------+-----------+----------+-------+ Subclavian               Yes       Yes                      +----------+------------+---------+-----------+----------+-------+  Summary:  Right: No evidence of deep vein thrombosis in the upper extremity. No evidence of superficial vein thrombosis in the upper extremity.  Left: No evidence of thrombosis in the subclavian.  *See table(s) above for measurements and observations.    Preliminary     Cardiac Studies   See above  Assessment   Principal Problem:   Subcapital fracture of left femur (HCC) Active Problems:   Hyperlipidemia   Anxiety and depression   OSA (obstructive sleep apnea)    Coronary artery calcification seen on computed tomography   Type 2 diabetes mellitus ([place])   Leukocytosis   Fall at home, initial encounter   Hypotension   Acute metabolic encephalopathy   Paroxysmal atrial fibrillation (HCC)   Acute blood loss anemia   Plan   1. Back in afib yesterday - started on IV amiodarone and now back in sinus. Would continue IV Amiodarone today and transition to po amiodarone likely tomorrow.  Time Spent Directly with Patient:  I have spent a total of 25 minutes with the patient reviewing hospital notes, telemetry, EKGs, labs and examining the patient as well as establishing an assessment and plan that was discussed personally with the patient.  > 50% of time was spent in direct patient care.  Length of Stay:  LOS: 5 days   Pixie Casino, MD, [facility], [place] Director of the Advanced Lipid Disorders &  Cardiovascular Risk Reduction Clinic Diplomate of the American Board of Clinical Lipidology Attending Cardiologist  Direct Dial: [phone]  Fax: [phone]  Website:  [email or web address] Lorilyn Laitinen 12/09/2020, 11:13 AM

## 2020-12-09 NOTE — Progress Notes (Addendum)
PROGRESS NOTE    Alexis [name]  TGG:[id number] DOB: Jan 02, 1951 DOA: 12/04/2020 PCP: Deland Pretty, MD    Brief Narrative:  Patient is a 70 year old female with history of hypertension, hyperlipidemia, coronary artery disease, diabetes type 2 with peripheral neuropathy, uterine cancer status post hysterectomy, OSA  from home after falling at home.  No history of loss of consciousness or head injury.  After the fall ,she immediately developed pain on the right hip and was unable to bear weight.  On presentation, she was hypertensive, x-ray showed subcapital fracture of the left femur.  Orthopedics consulted and plan is for ORIF  4/28-overnight issues were noted.  Early this morning patient was in atrial fibrillation, status post 1 unit blood transfusion.  Cardiology was consulted.  4/29-pt appears better than other days. Family concerned pt appears to be confused at times. During my exam, answers more appropriately and oriented, which family does agree pt is better than previous days. Hold off on Mri brain for now which family is agreeable with this plan.   4/30-husband at bedside reports patient is doing much better today.  No confusion.  Answering appropriately.  Patient also reports feeling much better.  Pain controlled.  Finally had bowel movement Currently in sinus rhythm  Consultants:   orthopedics, cardiology  Procedures:   Antimicrobials:    vanco, cefep, meto 4/27   Subjective: Denies chest pain, abdominal pain, shortness of breath   This SmartLink has not been configured with any valid records.    Objective: Vitals:   12/08/20 1926 12/08/20 2306 12/09/20 0310 12/09/20 0742  BP:  135/60 (!) 148/70 131/63  Pulse:  77 81 76  Resp: 20 20 20 15   Temp: 99 F (37.2 C) 99.5 F (37.5 C) 98.9 F (37.2 C) 98.2 F (36.8 C)  TempSrc: Oral Oral Oral Oral  SpO2:  92% 93% 96%  Weight:        Intake/Output Summary (Last 24 hours) at 12/09/2020 0957 Last data filed at  12/09/2020 0845 Gross per 24 hour  Intake 360 ml  Output 2700 ml  Net -2340 ml   Filed Weights   12/06/20 1400  Weight: 83.7 kg    Examination: Calm, looking better, NAD Decreased breath sounds at bases +jvd Regular S1-S2 no gallops Soft benign positive bowel sounds Trace pedal edema Alert oriented grossly intact, 5 out of 5 strength x4   Data Reviewed: I have personally reviewed following labs and imaging studies  CBC: Recent Labs  Lab 12/04/20 0549 12/05/20 0416 12/06/20 0256 12/06/20 1709 12/06/20 2124 12/07/20 0826 12/08/20 1233 12/09/20 0337  WBC 10.7*   < > 13.0* 9.3 9.5 10.5 10.0 9.4  NEUTROABS 7.9*  --  10.5*  --  7.1  --   --   --   HGB 12.5   < > 8.1* 7.0* 6.4* 9.5* 9.7* 9.5*  HCT 40.4   < > 25.9* 22.3* 20.1* 28.8* 28.5* 27.8*  MCV 85.2   < > 85.2 83.5 82.4 85.0 83.1 83.7  PLT 302   < > 185 154 142* 131* 154 167   < > = values in this interval not displayed.   Basic Metabolic Panel: Recent Labs  Lab 12/06/20 0256 12/06/20 2124 12/07/20 0826 12/08/20 1233 12/08/20 1752 12/09/20 0337  NA 130* 128* 131* 131*  --  131*  K 4.5 3.6 3.9 3.5  --  3.6  CL 94* 99 99 101  --  99  CO2 26 23 22 22   --  23  GLUCOSE 404* 341* 358* 310*  --  249*  BUN 36* 47* 41* 21  --  19  CREATININE 1.93* 1.50* 1.22* 0.73  --  0.74  CALCIUM 8.0* 7.5* 7.9* 8.2*  --  8.3*  MG  --   --  1.7  --  2.1  --    GFR: Estimated Creatinine Clearance: 70.9 mL/min (by C-G formula based on SCr of 0.74 mg/dL). Liver Function Tests: Recent Labs  Lab 12/04/20 0549 12/06/20 2124  AST 32 29  ALT 19 9  ALKPHOS 130* 57  BILITOT 0.8 0.6  PROT 6.9 4.5*  ALBUMIN 3.8 2.1*   No results for input(s): LIPASE, AMYLASE in the last 168 hours. No results for input(s): AMMONIA in the last 168 hours. Coagulation Profile: No results for input(s): INR, PROTIME in the last 168 hours. Cardiac Enzymes: No results for input(s): CKTOTAL, CKMB, CKMBINDEX, TROPONINI in the last 168 hours. BNP (last 3  results) No results for input(s): PROBNP in the last 8760 hours. HbA1C: No results for input(s): HGBA1C in the last 72 hours. CBG: Recent Labs  Lab 12/08/20 0834 12/08/20 1157 12/08/20 1634 12/08/20 2120 12/09/20 0739  GLUCAP 282* 342* 201* 261* 220*   Lipid Profile: No results for input(s): CHOL, HDL, LDLCALC, TRIG, CHOLHDL, LDLDIRECT in the last 72 hours. Thyroid Function Tests: Recent Labs    12/07/20 0826  TSH 1.190   Anemia Panel: No results for input(s): VITAMINB12, FOLATE, FERRITIN, TIBC, IRON, RETICCTPCT in the last 72 hours. Sepsis Labs: Recent Labs  Lab 12/06/20 2124  LATICACIDVEN 1.4    Recent Results (from the past 240 hour(s))  Resp Panel by RT-PCR (Flu A&B, Covid) Nasopharyngeal Swab     Status: None   Collection Time: 12/04/20  8:13 AM   Specimen: Nasopharyngeal Swab; Nasopharyngeal(NP) swabs in vial transport medium  Result Value Ref Range Status   SARS Coronavirus 2 by RT PCR NEGATIVE NEGATIVE Final    Comment: (NOTE) SARS-CoV-2 target nucleic acids are NOT DETECTED.  The SARS-CoV-2 RNA is generally detectable in upper respiratory specimens during the acute phase of infection. The lowest concentration of SARS-CoV-2 viral copies this assay can detect is 138 copies/mL. A negative result does not preclude SARS-Cov-2 infection and should not be used as the sole basis for treatment or other patient management decisions. A negative result may occur with  improper specimen collection/handling, submission of specimen other than nasopharyngeal swab, presence of viral mutation(s) within the areas targeted by this assay, and inadequate number of viral copies(<138 copies/mL). A negative result must be combined with clinical observations, patient history, and epidemiological information. The expected result is Negative.  Fact Sheet for Patients:  EntrepreneurPulse.com.au  Fact Sheet for Healthcare Providers:   IncredibleEmployment.be  This test is no t yet approved or cleared by the [place] FDA and  has been authorized for detection and/or diagnosis of SARS-CoV-2 by FDA under an Emergency Use Authorization (EUA). This EUA will remain  in effect (meaning this test can be used) for the duration of the COVID-19 declaration under Section 564(b)(1) of the Act, 21 U.S.C.section 360bbb-3(b)(1), unless the authorization is terminated  or revoked sooner.       Influenza A by PCR NEGATIVE NEGATIVE Final   Influenza B by PCR NEGATIVE NEGATIVE Final    Comment: (NOTE) The Xpert Xpress SARS-CoV-2/FLU/RSV plus assay is intended as an aid in the diagnosis of influenza from Nasopharyngeal swab specimens and should not be used as a sole basis for treatment. Nasal washings and  aspirates are unacceptable for Xpert Xpress SARS-CoV-2/FLU/RSV testing.  Fact Sheet for Patients: EntrepreneurPulse.com.au  Fact Sheet for Healthcare Providers: IncredibleEmployment.be  This test is not yet approved or cleared by the [place] FDA and has been authorized for detection and/or diagnosis of SARS-CoV-2 by FDA under an Emergency Use Authorization (EUA). This EUA will remain in effect (meaning this test can be used) for the duration of the COVID-19 declaration under Section 564(b)(1) of the Act, 21 U.S.C. section 360bbb-3(b)(1), unless the authorization is terminated or revoked.  Performed at [facility], [place] [address], [place], [place] [address]   Culture, Urine     Status: None   Collection Time: 12/07/20 11:59 AM   Specimen: Urine, Random  Result Value Ref Range Status   Specimen Description URINE, RANDOM  Final   Special Requests NONE  Final   Culture   Final    NO GROWTH Performed at [facility], [place] [address], [place], [place] [address]    Report Status 12/08/2020 FINAL  Final  Culture, blood (routine x 2)     Status: None  (Preliminary result)   Collection Time: 12/07/20  1:17 PM   Specimen: BLOOD  Result Value Ref Range Status   Specimen Description BLOOD RIGHT ANTECUBITAL  Final   Special Requests   Final    BOTTLES DRAWN AEROBIC AND ANAEROBIC Blood Culture adequate volume   Culture   Final    NO GROWTH 2 DAYS Performed at [facility], [place] [address], [place], [place] [address]    Report Status PENDING  Incomplete  Culture, blood (routine x 2)     Status: None (Preliminary result)   Collection Time: 12/07/20  1:17 PM   Specimen: BLOOD  Result Value Ref Range Status   Specimen Description BLOOD BLOOD LEFT HAND  Final   Special Requests   Final    BOTTLES DRAWN AEROBIC AND ANAEROBIC Blood Culture adequate volume   Culture   Final    NO GROWTH 2 DAYS Performed at [facility], [place] [address], [place], [place] [address]    Report Status PENDING  Incomplete         Radiology Studies: ECHOCARDIOGRAM COMPLETE  Result Date: 12/07/2020    ECHOCARDIOGRAM REPORT   Patient Name:   KAYLON TAJIMA Date of Exam: 12/07/2020 Medical Rec #:  [id number]         Height:       65.0 in Accession #:    [id number]        Weight:       184.5 lb Date of Birth:  1950/10/27         BSA:          1.912 m Patient Age:    54 years          BP:           121/62 mmHg Patient Gender: F                 HR:           83 bpm. Exam Location:  Inpatient Procedure: 2D Echo Indications:    atrial fibrillation  History:        Patient has prior history of Echocardiogram examinations, most                 recent 06/23/2007. Risk Factors:Sleep Apnea, Diabetes and  Dyslipidemia.  Sonographer:    Johny Chess Referring Phys: [id number] DAVID MANUEL ORTIZ  Sonographer Comments: Pain from probe pressure and Image acquisition challenging due to patient body habitus. IMPRESSIONS  1. Left ventricular ejection fraction, by estimation, is 60 to 65%. The left ventricle has normal function. The left ventricle has no  regional wall motion abnormalities. Left ventricular diastolic parameters were normal.  2. Right ventricular systolic function is normal. The right ventricular size is mildly enlarged. There is mildly elevated pulmonary artery systolic pressure. The estimated right ventricular systolic pressure is 0000000 mmHg.  3. The mitral valve is degenerative. No evidence of mitral valve regurgitation. No evidence of mitral stenosis. Moderate mitral annular calcification.  4. The aortic valve is tricuspid. There is mild calcification of the aortic valve. There is mild thickening of the aortic valve. Aortic valve regurgitation is not visualized. No aortic stenosis is present.  5. The inferior vena cava is normal in size with greater than 50% respiratory variability, suggesting right atrial pressure of 3 mmHg. FINDINGS  Left Ventricle: Left ventricular ejection fraction, by estimation, is 60 to 65%. The left ventricle has normal function. The left ventricle has no regional wall motion abnormalities. The left ventricular internal cavity size was normal in size. There is  no left ventricular hypertrophy. Left ventricular diastolic parameters were normal. Right Ventricle: The right ventricular size is mildly enlarged. No increase in right ventricular wall thickness. Right ventricular systolic function is normal. There is mildly elevated pulmonary artery systolic pressure. The tricuspid regurgitant velocity is 3.16 m/s, and with an assumed right atrial pressure of 3 mmHg, the estimated right ventricular systolic pressure is 0000000 mmHg. Left Atrium: Left atrial size was normal in size. Right Atrium: Right atrial size was normal in size. Pericardium: Trivial pericardial effusion is present. Presence of pericardial fat pad. Mitral Valve: The mitral valve is degenerative in appearance. Moderate mitral annular calcification. No evidence of mitral valve regurgitation. No evidence of mitral valve stenosis. Tricuspid Valve: The tricuspid valve  is grossly normal. Tricuspid valve regurgitation is mild . No evidence of tricuspid stenosis. Aortic Valve: The aortic valve is tricuspid. There is mild calcification of the aortic valve. There is mild thickening of the aortic valve. Aortic valve regurgitation is not visualized. No aortic stenosis is present. Pulmonic Valve: The pulmonic valve was grossly normal. Pulmonic valve regurgitation is not visualized. No evidence of pulmonic stenosis. Aorta: The aortic root and ascending aorta are structurally normal, with no evidence of dilitation. Venous: The inferior vena cava is normal in size with greater than 50% respiratory variability, suggesting right atrial pressure of 3 mmHg. IAS/Shunts: The atrial septum is grossly normal.  LEFT VENTRICLE PLAX 2D LVIDd:         4.40 cm  Diastology LVIDs:         2.80 cm  LV e' medial:    9.46 cm/s LV PW:         0.90 cm  LV E/e' medial:  12.3 LV IVS:        1.00 cm  LV e' lateral:   7.62 cm/s LVOT diam:     1.80 cm  LV E/e' lateral: 15.2 LV SV:         71 LV SV Index:   37 LVOT Area:     2.54 cm  RIGHT VENTRICLE             IVC RV S prime:     16.00 cm/s  IVC diam: 2.20 cm TAPSE (M-mode):  2.4 cm LEFT ATRIUM             Index       RIGHT ATRIUM           Index LA diam:        4.00 cm 2.09 cm/m  RA Area:     14.30 cm LA Vol (A2C):   52.4 ml 27.41 ml/m RA Volume:   36.00 ml  18.83 ml/m LA Vol (A4C):   57.1 ml 29.87 ml/m LA Biplane Vol: 59.7 ml 31.23 ml/m  AORTIC VALVE LVOT Vmax:   147.00 cm/s LVOT Vmean:  100.000 cm/s LVOT VTI:    0.278 m  AORTA Ao Root diam: 3.10 cm Ao Asc diam:  3.70 cm MITRAL VALVE                TRICUSPID VALVE MV Area (PHT): 3.99 cm     TR Peak grad:   39.9 mmHg MV Decel Time: 190 msec     TR Vmax:        316.00 cm/s MV E velocity: 116.00 cm/s MV A velocity: 119.00 cm/s  SHUNTS MV E/A ratio:  0.97         Systemic VTI:  0.28 m                             Systemic Diam: 1.80 cm Eleonore Chiquito MD Electronically signed by Eleonore Chiquito MD Signature  Date/Time: 12/07/2020/2:18:31 PM    Final    VAS [place] UPPER EXTREMITY VENOUS DUPLEX  Result Date: 12/08/2020 UPPER VENOUS STUDY  Patient Name:  ADELIA BORDEN  Date of Exam:   12/08/2020 Medical Rec #: [id number]          Accession #:    [id number] Date of Birth: Nov 03, 1950          Patient Gender: F Patient Age:   25Y Exam Location:  [facility] Procedure:      VAS [place] UPPER EXTREMITY VENOUS DUPLEX Referring Phys: [id number] [facility] Carlis Blanchard --------------------------------------------------------------------------------  Indications: Right hand swelling Comparison Study: No prior study Performing Technologist: Maudry Mayhew MHA, RDMS, RVT, RDCS  Examination Guidelines: A complete evaluation includes B-mode imaging, spectral Doppler, color Doppler, and power Doppler as needed of all accessible portions of each vessel. Bilateral testing is considered an integral part of a complete examination. Limited examinations for reoccurring indications may be performed as noted.  Right Findings: +----------+------------+---------+-----------+----------+-------------+ RIGHT     CompressiblePhasicitySpontaneousProperties   Summary    +----------+------------+---------+-----------+----------+-------------+ IJV           Full       Yes       Yes                            +----------+------------+---------+-----------+----------+-------------+ Subclavian    Full       Yes       Yes                            +----------+------------+---------+-----------+----------+-------------+ Axillary      Full       Yes       Yes              Rouleaux flow +----------+------------+---------+-----------+----------+-------------+ Brachial      Full       Yes       Yes  Rouleaux flow +----------+------------+---------+-----------+----------+-------------+ Radial        Full                                                 +----------+------------+---------+-----------+----------+-------------+ Ulnar         Full                                                +----------+------------+---------+-----------+----------+-------------+ Cephalic      Full                                                +----------+------------+---------+-----------+----------+-------------+ Basilic       Full                                                +----------+------------+---------+-----------+----------+-------------+  Left Findings: +----------+------------+---------+-----------+----------+-------+ LEFT      CompressiblePhasicitySpontaneousPropertiesSummary +----------+------------+---------+-----------+----------+-------+ Subclavian               Yes       Yes                      +----------+------------+---------+-----------+----------+-------+  Summary:  Right: No evidence of deep vein thrombosis in the upper extremity. No evidence of superficial vein thrombosis in the upper extremity.  Left: No evidence of thrombosis in the subclavian.  *See table(s) above for measurements and observations.    Preliminary         Scheduled Meds: . apixaban  5 mg Oral BID  . atorvastatin  20 mg Oral QHS  . Chlorhexidine Gluconate Cloth  6 each Topical Daily  . docusate sodium  100 mg Oral BID  . feeding supplement  237 mL Oral BID BM  . insulin aspart  0-15 Units Subcutaneous TID WC  . insulin aspart  5 Units Subcutaneous TID WC  . insulin glargine  35 Units Subcutaneous Daily  . insulin glargine  5 Units Subcutaneous Once  . metoprolol succinate  100 mg Oral Daily  . midodrine  5 mg Oral BID WC  . pantoprazole  40 mg Oral BID  . polyethylene glycol  17 g Oral BID  . pregabalin  50 mg Oral BID  . tamsulosin  0.4 mg Oral QHS  . venlafaxine XR  150 mg Oral BID   Continuous Infusions: . amiodarone 30 mg/hr (12/09/20 0158)  . ceFEPime (MAXIPIME) IV 2 g (12/08/20 2112)  . metronidazole 500 mg (12/09/20  0203)  . vancomycin 750 mg (12/09/20 0536)    Assessment & Plan:   Principal Problem:   Subcapital fracture of left femur (HCC) Active Problems:   Hyperlipidemia   Anxiety and depression   OSA (obstructive sleep apnea)   Coronary artery calcification seen on computed tomography   Type 2 diabetes mellitus ([place])   Leukocytosis   Fall at home, initial encounter   Hypotension   Acute metabolic encephalopathy   Paroxysmal atrial fibrillation (HCC)   Acute blood loss anemia  Left subcapital femur fracture: POD2  Secondary to mechanical fall  Status post left total hip arthroplasty on 4/26 PT recommends SNF Per Ortho Dr. Lyla Glassing okay to start Lovenox for DVT prophylaxis via chat 4/28-Ortho will like patient to be on aspirin 81 mg twice daily at discharge however patient is allergic to aspirin we will discuss this with him. 4/30 WBAT with walker  Will need to follow-up with orthopedics      Leukocytosi/low grade fever Has remained afebrile 24 hours  No growth on blood cultures or urine culture  Will discontinue all antibiotics  Continue to monitor      PAF/RVR- Was back in afib on 4/29. Now in sinus rhythm Cardiology following Likely multifactorial has OSA with profound anemia post op when first occurs happened. Was started on beta blk and eliquis Echo nml EF, no wall motion abnormalities plavix home dose was discontinued since started on Eliquis Anemia corrected Was started oniv amiodarone yesterday when was in afib rvr. Per cardiology continue IV amiodarone and transition to p.o. likely tomorrow Is mildly volume overloaded, on 2L [place], likely from blood, ivf, iv meds. BNP elevated. Will give low dose lasix 20mg  iv x1. Obtain cxr    Post op blood loss- Hg 6.4  Hemoglobin was 6.4 ,s/p 2units PRBC 4/30 hemoglobin remained stable Stool occult pending   Urinary retention-post op Had bowel movement Continue Flomax Will DC Foley for voiding trial    AKI:   Renal function worsening. NO void. Possibly multifactorial as bp low, urinary retention, and BG elevated (prerenal) 4/30 renal function improved after IV fluids and Foley placed    Diabetes type 2: A1c of 10.3   Takes Farxiga, metformin, Tresiba, dulaglutide at home. Blood glucose are improving Will increase NovoLog to 8 units per meal Continue Lantus 35 units  R-ISS    Coronary artery disease: Recently had coronary CTA which showed diffuse nonobstructive coronary artery disease.  On statin and Plavix at home. plavix d/cd Unable to take aspirin due to GI issues per daughter  Hypertension:  Now hypotensive. Initially BP meds were held, due to atrial fibrillation and rate control cardiology decrease midodrine, and if bp remains stable will d/c it 4/30stable.      Hyperlipidemia: continue statin  Anxiety/depression: On venlafaxine, on Ativan as needed  OSA: Does not use CPAP. D/w pt and husband need to get reassed for CPAP as outpt. GERD: Protonix     DVT prophylaxis:eliquis Code Status:full Family Communication:husband at bedside  Status is: Inpatient  Remains inpatient appropriate because:Inpatient level of care appropriate due to severity of illness   Dispo: The patient is from: Home              Anticipated d/c is to: SNF              Patient currently is not medically stable to d/c.   Difficult to place patient No            LOS: 5 days   Time spent: 45 minutes with more than 50% on [place], MD [facility] Pager [phone]  If 7PM-7AM, please contact night-coverage 12/09/2020, 9:57 AM

## 2020-12-09 NOTE — [place] FL2 (Signed)
[place] MEDICAID FL2 LEVEL OF CARE SCREENING TOOL     IDENTIFICATION  Patient Name: Alexis [name] Birthdate: [birth year] Sex: female Admission Date (Current Location): 12/04/2020  [facility] and [place] Number:  Herbalist and Address:  [facility], [place] [address], [place], [place] [address]      Provider Number: [id number]  Attending Physician Name and Address:  Nolberto Hanlon, MD  Relative Name and Phone Number:  Donney Rankins [phone]    Current Level of Care: Hospital Recommended Level of Care: [place] Prior Approval Number:    Date Approved/Denied: 12/09/20 PASRR Number: [id number]  Discharge Plan: Home    Current Diagnoses: Patient Active Problem List   Diagnosis Date Noted  . Hypotension 12/07/2020  . Acute metabolic encephalopathy 33/29/5188  . Paroxysmal atrial fibrillation (HCC)   . Acute blood loss anemia   . Subcapital fracture of left femur ([place]) 12/04/2020  . Leukocytosis 12/04/2020  . Fall at home, initial encounter 12/04/2020  . Diabetic peripheral neuropathy ([place]) 10/20/2018  . Cervical myelopathy ([place]) 05/07/2018  . Type 2 diabetes mellitus ([place]) 02/18/2018  . Memory difficulties 09/18/2017  . Coronary artery calcification seen on computed tomography 04/09/2017  . Word finding difficulty 09/05/2016  . Cervicogenic headache 09/05/2016  . Palpitation 08/22/2016  . Bilateral leg edema 08/22/2016  . Obesity (BMI 30-39.9) 05/25/2014  . Precordial chest pain 05/25/2014  . Migraine without aura, without mention of intractable migraine without mention of status migrainosus 03/18/2013  . Chest pain 01/29/2013  . Constipation 02/24/2012  . DM (diabetes mellitus), type 2, uncontrolled ([place]) 08/24/2010  . Hyperlipidemia 08/24/2010  . ANXIETY 08/24/2010  . Anxiety and depression 08/24/2010  . Essential hypertension 08/24/2010  . GERD 08/24/2010  . IRRITABLE BOWEL SYNDROME 08/24/2010  . FATTY  LIVER DISEASE 08/24/2010  . ARTHRITIS 08/24/2010  . Fibromyalgia 08/24/2010  . OSA (obstructive sleep apnea) 08/24/2010  . ABDOMINAL PAIN-RUQ 08/24/2010  . UTERINE CANCER, HX OF 08/24/2010  . GASTRITIS, HX OF 08/24/2010    Orientation RESPIRATION BLADDER Height & Weight     Self,Time,Situation,Place  Normal Incontinent Weight: 184 lb 8.4 oz (83.7 kg) Height:     BEHAVIORAL SYMPTOMS/MOOD NEUROLOGICAL BOWEL NUTRITION STATUS      Incontinent Diet (Carb modified)  AMBULATORY STATUS COMMUNICATION OF NEEDS Skin   Extensive Assist Verbally Normal                       Personal Care Assistance Level of Assistance  Feeding,Bathing,Dressing Bathing Assistance: Maximum assistance Feeding assistance: Independent Dressing Assistance: Limited assistance     Functional Limitations Info             SPECIAL CARE FACTORS FREQUENCY  PT (By licensed PT),OT (By licensed OT)     PT Frequency: 5x weekly OT Frequency: 5x weekly            Contractures Contractures Info: Not present    Additional Factors Info  Code Status,Allergies Code Status Info: Full Allergies Info: Asprin, codein, naproxen, sulfanamide derivatives           Current Medications (12/09/2020):  This is the current hospital active medication list Current Facility-Administered Medications  Medication Dose Route Frequency Provider Last Rate Last Admin  . acetaminophen (TYLENOL) tablet 650 mg  650 mg Oral Q6H PRN Reubin Milan, MD   650 mg at 12/09/20 0321  . ALPRAZolam Duanne Moron) tablet 0.5-1 mg  0.5-1 mg Oral QHS PRN Reubin Milan, MD  1 mg at 12/07/20 2323  . amiodarone (NEXTERONE PREMIX) 360-4.14 MG/200ML-% (1.8 mg/mL) IV infusion  30 mg/hr Intravenous Continuous Sherren Mocha, MD 16.67 mL/hr at 12/09/20 0158 30 mg/hr at 12/09/20 0158  . apixaban (ELIQUIS) tablet 5 mg  5 mg Oral BID Pixie Casino, MD   5 mg at 12/09/20 1047  . atorvastatin (LIPITOR) tablet 20 mg  20 mg Oral QHS Reubin Milan, MD   20 mg at 12/08/20 2110  . Chlorhexidine Gluconate Cloth 2 % PADS 6 each  6 each Topical Daily Nolberto Hanlon, MD   6 each at 12/09/20 1055  . diphenhydrAMINE (BENADRYL) capsule 25 mg  25 mg Oral Q6H PRN Nolberto Hanlon, MD   25 mg at 12/07/20 2144  . docusate sodium (COLACE) capsule 100 mg  100 mg Oral BID Reubin Milan, MD   100 mg at 12/09/20 1054  . feeding supplement (ENSURE ENLIVE / ENSURE PLUS) liquid 237 mL  237 mL Oral BID BM Amery, Sahar, MD      . insulin aspart (novoLOG) injection 0-15 Units  0-15 Units Subcutaneous TID WC Reubin Milan, MD   5 Units at 12/09/20 1053  . insulin aspart (novoLOG) injection 5 Units  5 Units Subcutaneous TID WC Nolberto Hanlon, MD   5 Units at 12/09/20 1054  . insulin glargine (LANTUS) injection 35 Units  35 Units Subcutaneous Daily Nolberto Hanlon, MD   35 Units at 12/09/20 1048  . insulin glargine (LANTUS) injection 5 Units  5 Units Subcutaneous Once Nolberto Hanlon, MD      . menthol-cetylpyridinium (CEPACOL) lozenge 3 mg  1 lozenge Oral PRN Reubin Milan, MD       Or  . phenol [facility]) mouth spray 1 spray  1 spray Mouth/Throat PRN Reubin Milan, MD      . metoCLOPramide [facility]) tablet 5-10 mg  5-10 mg Oral Q8H PRN Reubin Milan, MD       Or  . metoCLOPramide [facility]) injection 5-10 mg  5-10 mg Intravenous Q8H PRN Reubin Milan, MD      . metoprolol succinate (TOPROL-XL) 24 hr tablet 100 mg  100 mg Oral Daily Pixie Casino, MD   100 mg at 12/09/20 1048  . midodrine (PROAMATINE) tablet 5 mg  5 mg Oral BID WC Hilty, Nadean Corwin, MD   5 mg at 12/09/20 1048  . ondansetron (ZOFRAN) tablet 4 mg  4 mg Oral Q6H PRN Reubin Milan, MD       Or  . ondansetron [facility]) injection 4 mg  4 mg Intravenous Q6H PRN Reubin Milan, MD      . pantoprazole (PROTONIX) EC tablet 40 mg  40 mg Oral BID Reubin Milan, MD   40 mg at 12/09/20 1048  . polyethylene glycol (MIRALAX / GLYCOLAX) packet 17 g  17 g Oral BID  Nolberto Hanlon, MD   17 g at 12/09/20 1047  . pregabalin (LYRICA) capsule 50 mg  50 mg Oral BID Reubin Milan, MD   50 mg at 12/09/20 1048  . SUMAtriptan (IMITREX) tablet 100 mg  100 mg Oral Q2H PRN Reubin Milan, MD      . tamsulosin [facility]) capsule 0.4 mg  0.4 mg Oral QHS Reubin Milan, MD   0.4 mg at 12/08/20 2110  . venlafaxine XR (EFFEXOR-XR) 24 hr capsule 150 mg  150 mg Oral BID Reubin Milan, MD   150 mg at 12/09/20 1047  Discharge Medications: Please see discharge summary for a list of discharge medications.  Relevant Imaging Results:  Relevant Lab Results:   Additional Information SSN: [id number]  Oretha Milch, LCSW

## 2020-12-10 DIAGNOSIS — S72012P Unspecified intracapsular fracture of left femur, subsequent encounter for closed fracture with malunion: Secondary | ICD-10-CM | POA: Diagnosis not present

## 2020-12-10 DIAGNOSIS — E876 Hypokalemia: Secondary | ICD-10-CM

## 2020-12-10 DIAGNOSIS — G9341 Metabolic encephalopathy: Secondary | ICD-10-CM | POA: Diagnosis not present

## 2020-12-10 DIAGNOSIS — F419 Anxiety disorder, unspecified: Secondary | ICD-10-CM | POA: Diagnosis not present

## 2020-12-10 DIAGNOSIS — I5031 Acute diastolic (congestive) heart failure: Secondary | ICD-10-CM | POA: Diagnosis not present

## 2020-12-10 DIAGNOSIS — I480 Paroxysmal atrial fibrillation: Secondary | ICD-10-CM | POA: Diagnosis not present

## 2020-12-10 DIAGNOSIS — D62 Acute posthemorrhagic anemia: Secondary | ICD-10-CM | POA: Diagnosis not present

## 2020-12-10 LAB — BASIC METABOLIC PANEL
Anion gap: 9 (ref 5–15)
BUN: 16 mg/dL (ref 8–23)
CO2: 26 mmol/L (ref 22–32)
Calcium: 8.2 mg/dL — ABNORMAL LOW (ref 8.9–10.3)
Chloride: 100 mmol/L (ref 98–111)
Creatinine, Ser: 0.7 mg/dL (ref 0.44–1.00)
GFR, Estimated: >60 mL/min (ref >60)
Glucose, Bld: 219 mg/dL — ABNORMAL HIGH (ref 70–99)
Potassium: 3.3 mmol/L — ABNORMAL LOW (ref 3.5–5.1)
Sodium: 135 mmol/L (ref 135–145)

## 2020-12-10 LAB — GLUCOSE, CAPILLARY
Glucose-Capillary: 178 mg/dL — ABNORMAL HIGH (ref 70–99)
Glucose-Capillary: 200 mg/dL — ABNORMAL HIGH (ref 70–99)
Glucose-Capillary: 243 mg/dL — ABNORMAL HIGH (ref 70–99)
Glucose-Capillary: 251 mg/dL — ABNORMAL HIGH (ref 70–99)

## 2020-12-10 MED ORDER — POTASSIUM CHLORIDE CRYS ER 20 MEQ PO TBCR
40.0000 meq | EXTENDED_RELEASE_TABLET | Freq: Two times a day (BID) | ORAL | Status: AC
  Administered 2020-12-10 (×2): 40 meq via ORAL
  Filled 2020-12-10 (×2): qty 2

## 2020-12-10 MED ORDER — FUROSEMIDE 10 MG/ML IJ SOLN
40.0000 mg | Freq: Once | INTRAMUSCULAR | Status: AC
  Administered 2020-12-10: 40 mg via INTRAVENOUS
  Filled 2020-12-10: qty 4

## 2020-12-10 MED ORDER — AMIODARONE HCL 200 MG PO TABS
200.0000 mg | ORAL_TABLET | Freq: Two times a day (BID) | ORAL | Status: DC
  Administered 2020-12-10 – 2020-12-12 (×5): 200 mg via ORAL
  Filled 2020-12-10 (×5): qty 1

## 2020-12-10 MED ORDER — POLYETHYLENE GLYCOL 3350 17 G PO PACK
17.0000 g | PACK | Freq: Every day | ORAL | Status: DC
  Filled 2020-12-10: qty 1

## 2020-12-10 MED ORDER — MIDODRINE HCL 5 MG PO TABS
2.5000 mg | ORAL_TABLET | Freq: Two times a day (BID) | ORAL | Status: DC
  Administered 2020-12-10 – 2020-12-12 (×4): 2.5 mg via ORAL
  Filled 2020-12-10 (×4): qty 1

## 2020-12-10 MED ORDER — DOCUSATE SODIUM 100 MG PO CAPS: 100.0000 mg | ORAL_CAPSULE | Freq: Two times a day (BID) | ORAL | Status: DC | PRN

## 2020-12-10 NOTE — Progress Notes (Addendum)
PROGRESS NOTE    Alexis [name]  [id number] DOB: March 21, 1951 DOA: 12/04/2020 PCP: Deland Pretty, MD    Brief Narrative:  Patient is a 70 year old female with history of hypertension, hyperlipidemia, coronary artery disease, diabetes type 2 with peripheral neuropathy, uterine cancer status post hysterectomy, OSA  from home after falling at home.  No history of loss of consciousness or head injury.  After the fall ,she immediately developed pain on the right hip and was unable to bear weight.  On presentation, she was hypertensive, x-ray showed subcapital fracture of the left femur.  Orthopedics consulted and plan is for ORIF  4/28-overnight issues were noted.  Early this morning patient was in atrial fibrillation, status post 1 unit blood transfusion.  Cardiology was consulted.  4/29-pt appears better than other days. Family concerned pt appears to be confused at times. During my exam, answers more appropriately and oriented, which family does agree pt is better than previous days. Hold off on Mri brain for now which family is agreeable with this plan.   4/30-husband at bedside reports patient is doing much better today.  No confusion.  Answering appropriately.  Patient also reports feeling much better.  Pain controlled.  Finally had bowel movement Currently in sinus rhythm  5/1-had 4 bowel movements yesterday.  This a.m. 1 bowel movement.  Good urine output with 20 mg of IV Lasix yesterday, net -3.3 since admission  Consultants:   orthopedics, cardiology  Procedures:   Antimicrobials:    vanco, cefep, meto 4/27-4/31   Subjective: Denies any shortness of breath, chest pain, palpitations.  Her shortness of breath is better.   This SmartLink has not been configured with any valid records.    Objective: Vitals:   12/09/20 1957 12/09/20 2344 12/10/20 0330 12/10/20 0731  BP: 138/65 (!) 160/69 138/65 (!) 155/71  Pulse:    74  Resp: 20  20 20   Temp: 98.4 F (36.9 C) 98 F  (36.7 C) 98.6 F (37 C) 98.4 F (36.9 C)  TempSrc: Oral Oral Oral Oral  SpO2:   96% 97%  Weight:        Intake/Output Summary (Last 24 hours) at 12/10/2020 0917 Last data filed at 12/09/2020 2000 Gross per 24 hour  Intake 360 ml  Output 2250 ml  Net -1890 ml   Filed Weights   12/06/20 1400  Weight: 83.7 kg    Examination: Sitting, NAD Fine crackles bilateral bases, no wheezing Regular S1-S2 no gallops Soft benign positive bowel sounds Positive edema bilaterally Alert oriented x3 grossly intact Mood and affect appropriate in current setting  Data Reviewed: I have personally reviewed following labs and imaging studies  CBC: Recent Labs  Lab 12/04/20 0549 12/05/20 0416 12/06/20 0256 12/06/20 1709 12/06/20 2124 12/07/20 0826 12/08/20 1233 12/09/20 0337  WBC 10.7*   < > 13.0* 9.3 9.5 10.5 10.0 9.4  NEUTROABS 7.9*  --  10.5*  --  7.1  --   --   --   HGB 12.5   < > 8.1* 7.0* 6.4* 9.5* 9.7* 9.5*  HCT 40.4   < > 25.9* 22.3* 20.1* 28.8* 28.5* 27.8*  MCV 85.2   < > 85.2 83.5 82.4 85.0 83.1 83.7  PLT 302   < > 185 154 142* 131* 154 167   < > = values in this interval not displayed.   Basic Metabolic Panel: Recent Labs  Lab 12/06/20 2124 12/07/20 0826 12/08/20 1233 12/08/20 1752 12/09/20 0337 12/10/20 0152  NA 128* 131* 131*  --  131* 135  K 3.6 3.9 3.5  --  3.6 3.3*  CL 99 99 101  --  99 100  CO2 23 22 22   --  23 26  GLUCOSE 341* 358* 310*  --  249* 219*  BUN 47* 41* 21  --  19 16  CREATININE 1.50* 1.22* 0.73  --  0.74 0.70  CALCIUM 7.5* 7.9* 8.2*  --  8.3* 8.2*  MG  --  1.7  --  2.1  --   --    GFR: Estimated Creatinine Clearance: 70.9 mL/min (by C-G formula based on SCr of 0.7 mg/dL). Liver Function Tests: Recent Labs  Lab 12/04/20 0549 12/06/20 2124  AST 32 29  ALT 19 9  ALKPHOS 130* 57  BILITOT 0.8 0.6  PROT 6.9 4.5*  ALBUMIN 3.8 2.1*   No results for input(s): LIPASE, AMYLASE in the last 168 hours. No results for input(s): AMMONIA in the last  168 hours. Coagulation Profile: No results for input(s): INR, PROTIME in the last 168 hours. Cardiac Enzymes: No results for input(s): CKTOTAL, CKMB, CKMBINDEX, TROPONINI in the last 168 hours. BNP (last 3 results) No results for input(s): PROBNP in the last 8760 hours. HbA1C: No results for input(s): HGBA1C in the last 72 hours. CBG: Recent Labs  Lab 12/09/20 0739 12/09/20 1314 12/09/20 1701 12/09/20 2137 12/10/20 0733  GLUCAP 220* 305* 242* 231* 178*   Lipid Profile: No results for input(s): CHOL, HDL, LDLCALC, TRIG, CHOLHDL, LDLDIRECT in the last 72 hours. Thyroid Function Tests: No results for input(s): TSH, T4TOTAL, FREET4, T3FREE, THYROIDAB in the last 72 hours. Anemia Panel: No results for input(s): VITAMINB12, FOLATE, FERRITIN, TIBC, IRON, RETICCTPCT in the last 72 hours. Sepsis Labs: Recent Labs  Lab 12/06/20 2124  LATICACIDVEN 1.4    Recent Results (from the past 240 hour(s))  Resp Panel by RT-PCR (Flu A&B, Covid) Nasopharyngeal Swab     Status: None   Collection Time: 12/04/20  8:13 AM   Specimen: Nasopharyngeal Swab; Nasopharyngeal(NP) swabs in vial transport medium  Result Value Ref Range Status   SARS Coronavirus 2 by RT PCR NEGATIVE NEGATIVE Final    Comment: (NOTE) SARS-CoV-2 target nucleic acids are NOT DETECTED.  The SARS-CoV-2 RNA is generally detectable in upper respiratory specimens during the acute phase of infection. The lowest concentration of SARS-CoV-2 viral copies this assay can detect is 138 copies/mL. A negative result does not preclude SARS-Cov-2 infection and should not be used as the sole basis for treatment or other patient management decisions. A negative result may occur with  improper specimen collection/handling, submission of specimen other than nasopharyngeal swab, presence of viral mutation(s) within the areas targeted by this assay, and inadequate number of viral copies(<138 copies/mL). A negative result must be combined  with clinical observations, patient history, and epidemiological information. The expected result is Negative.  Fact Sheet for Patients:  EntrepreneurPulse.com.au  Fact Sheet for Healthcare Providers:  IncredibleEmployment.be  This test is no t yet approved or cleared by the [place] FDA and  has been authorized for detection and/or diagnosis of SARS-CoV-2 by FDA under an Emergency Use Authorization (EUA). This EUA will remain  in effect (meaning this test can be used) for the duration of the COVID-19 declaration under Section 564(b)(1) of the Act, 21 U.S.C.section 360bbb-3(b)(1), unless the authorization is terminated  or revoked sooner.       Influenza A by PCR NEGATIVE NEGATIVE Final   Influenza B by PCR NEGATIVE NEGATIVE Final    Comment: (  NOTE) The Xpert Xpress SARS-CoV-2/FLU/RSV plus assay is intended as an aid in the diagnosis of influenza from Nasopharyngeal swab specimens and should not be used as a sole basis for treatment. Nasal washings and aspirates are unacceptable for Xpert Xpress SARS-CoV-2/FLU/RSV testing.  Fact Sheet for Patients: EntrepreneurPulse.com.au  Fact Sheet for Healthcare Providers: IncredibleEmployment.be  This test is not yet approved or cleared by the [place] FDA and has been authorized for detection and/or diagnosis of SARS-CoV-2 by FDA under an Emergency Use Authorization (EUA). This EUA will remain in effect (meaning this test can be used) for the duration of the COVID-19 declaration under Section 564(b)(1) of the Act, 21 U.S.C. section 360bbb-3(b)(1), unless the authorization is terminated or revoked.  Performed at [facility], [place] [address], [place], [place] [address]   Culture, Urine     Status: None   Collection Time: 12/07/20 11:59 AM   Specimen: Urine, Random  Result Value Ref Range Status   Specimen Description URINE, RANDOM  Final    Special Requests NONE  Final   Culture   Final    NO GROWTH Performed at [facility], [place] [address], [place], [place] [address]    Report Status 12/08/2020 FINAL  Final  Culture, blood (routine x 2)     Status: None (Preliminary result)   Collection Time: 12/07/20  1:17 PM   Specimen: BLOOD  Result Value Ref Range Status   Specimen Description BLOOD RIGHT ANTECUBITAL  Final   Special Requests   Final    BOTTLES DRAWN AEROBIC AND ANAEROBIC Blood Culture adequate volume   Culture   Final    NO GROWTH 2 DAYS Performed at [facility], [place] [address] [place], [place] [address]    Report Status PENDING  Incomplete  Culture, blood (routine x 2)     Status: None (Preliminary result)   Collection Time: 12/07/20  1:17 PM   Specimen: BLOOD  Result Value Ref Range Status   Specimen Description BLOOD BLOOD LEFT HAND  Final   Special Requests   Final    BOTTLES DRAWN AEROBIC AND ANAEROBIC Blood Culture adequate volume   Culture   Final    NO GROWTH 2 DAYS Performed at [facility], [place] [address], [place], [place] [address]    Report Status PENDING  Incomplete         Radiology Studies: DG CHEST PORT 1 VIEW  Result Date: 12/09/2020 CLINICAL DATA:  Shortness of breath. EXAM: PORTABLE CHEST 1 VIEW COMPARISON:  12/06/2020 FINDINGS: 1119 hours. The cardio pericardial silhouette is enlarged. There is pulmonary vascular congestion without overt pulmonary edema. Interstitial markings are diffusely coarsened with chronic features. Retrocardiac atelectasis or infiltrate noted. IMPRESSION: Retrocardiac left base atelectasis or infiltrate. Cardiomegaly with vascular congestion. Electronically Signed   By: Misty Stanley M.D.   On: 12/09/2020 13:11   VAS [place] UPPER EXTREMITY VENOUS DUPLEX  Result Date: 12/09/2020 UPPER VENOUS STUDY  Patient Name:  NADELINE BLAES  Date of Exam:   12/08/2020 Medical Rec #: [id number]          Accession #:    [id number] Date of Birth: [birth year]           Patient Gender: F Patient Age:   70Y Exam Location:  [facility] Procedure:      VAS [place] UPPER EXTREMITY VENOUS DUPLEX Referring Phys: [id number] [facility] Lameisha Schuenemann --------------------------------------------------------------------------------  Indications: Right hand swelling Comparison Study: No prior study Performing Technologist: Sharyn Lull  Simonetti MHA, RDMS, RVT, RDCS  Examination Guidelines: A complete evaluation includes B-mode imaging, spectral Doppler, color Doppler, and power Doppler as needed of all accessible portions of each vessel. Bilateral testing is considered an integral part of a complete examination. Limited examinations for reoccurring indications may be performed as noted.  Right Findings: +----------+------------+---------+-----------+----------+-------------+ RIGHT     CompressiblePhasicitySpontaneousProperties   Summary    +----------+------------+---------+-----------+----------+-------------+ IJV           Full       Yes       Yes                            +----------+------------+---------+-----------+----------+-------------+ Subclavian    Full       Yes       Yes                            +----------+------------+---------+-----------+----------+-------------+ Axillary      Full       Yes       Yes              Rouleaux flow +----------+------------+---------+-----------+----------+-------------+ Brachial      Full       Yes       Yes              Rouleaux flow +----------+------------+---------+-----------+----------+-------------+ Radial        Full                                                +----------+------------+---------+-----------+----------+-------------+ Ulnar         Full                                                +----------+------------+---------+-----------+----------+-------------+ Cephalic      Full                                                 +----------+------------+---------+-----------+----------+-------------+ Basilic       Full                                                +----------+------------+---------+-----------+----------+-------------+  Left Findings: +----------+------------+---------+-----------+----------+-------+ LEFT      CompressiblePhasicitySpontaneousPropertiesSummary +----------+------------+---------+-----------+----------+-------+ Subclavian               Yes       Yes                      +----------+------------+---------+-----------+----------+-------+  Summary:  Right: No evidence of deep vein thrombosis in the upper extremity. No evidence of superficial vein thrombosis in the upper extremity.  Left: No evidence of thrombosis in the subclavian.  *See table(s) above for measurements and observations.  Diagnosing physician: Harold Barban MD Electronically signed by Harold Barban MD on 12/09/2020 at 4:15:19 PM.    Final         Scheduled Meds: .  amiodarone  200 mg Oral BID  . apixaban  5 mg Oral BID  . atorvastatin  20 mg Oral QHS  . Chlorhexidine Gluconate Cloth  6 each Topical Daily  . docusate sodium  100 mg Oral BID  . feeding supplement (GLUCERNA SHAKE)  237 mL Oral BID BM  . insulin aspart  0-15 Units Subcutaneous TID WC  . insulin aspart  8 Units Subcutaneous TID WC  . insulin glargine  35 Units Subcutaneous Daily  . insulin glargine  5 Units Subcutaneous Once  . metoprolol succinate  100 mg Oral Daily  . midodrine  5 mg Oral BID WC  . pantoprazole  40 mg Oral BID  . polyethylene glycol  17 g Oral BID  . potassium chloride  40 mEq Oral BID  . pregabalin  50 mg Oral BID  . tamsulosin  0.4 mg Oral QHS  . venlafaxine XR  150 mg Oral BID   Continuous Infusions:   Assessment & Plan:   Principal Problem:   Subcapital fracture of left femur (HCC) Active Problems:   Hyperlipidemia   Anxiety and depression   OSA (obstructive sleep apnea)   Coronary artery calcification  seen on computed tomography   Type 2 diabetes mellitus (HCC)   Leukocytosis   Fall at home, initial encounter   Hypotension   Acute metabolic encephalopathy   Paroxysmal atrial fibrillation (HCC)   Acute blood loss anemia   Left subcapital femur fracture: POD2  Secondary to mechanical fall  Status post left total hip arthroplasty on 4/26 PT recommends SNF Per Ortho Dr. Lyla Glassing okay to start Lovenox for DVT prophylaxis via chat 4/28-Ortho will like patient to be on aspirin 81 mg twice daily at discharge however patient is allergic to aspirin we will discuss this with him. 5/1 WBAT with walker  Will need to follow-up with orthopedics  PT recommends SNF -pending bed offer      Leukocytosi/low grade fever Remained afebrile Leukocytosis resolved No growth on blood culture urine culture, dose all antibiotics were discontinued     PAF/RVR- Was back in afib on 4/29. Now in sinus rhythm Cardiology following Likely multifactorial has OSA with profound anemia post op when first occurs happened. Was started on beta blk and eliquis Echo nml EF, no wall motion abnormalities plavix home dose was discontinued since started on Eliquis Anemia corrected Was started on iv amiodarone yesterday when was in afib rvr. Per cardiology continue IV amiodarone and transition to p.o. likely tomorrow Is mildly volume overloaded, on 2L [place], likely from blood, ivf, iv meds. BNP elevated. Will give low dose lasix 20mg  iv x1. 5/1-continues to be in sinus rhythm on IV amiodarone Amiodarone was changed from IV to 200 mg p.o. twice daily today Continue Toprol-XL 100 mg daily Continue Eliquis 5 mg twice daily Lasix 40mg  iv x1 given by cards    Post op blood loss-  Hemoglobin was 6.4 ,s/p 2units PRBC 5/1-stool occult postive Spoke to Dr. Henrene Pastor via chat about stool occult +,but since pt without any obvious bloody stool, PRBPR, and Hg stable, he thought blood loss from surgery. Her recommended just  following pt and if any obvious bleeding then reconsult him.   Urinary retention-post op +BM Continue flomax D/c foley , voiding trial with bladder scan  Hypokalemia-mild.  3.3 Replace with potassium Monitor levels    AKI:  Renal function worsening. NO void. Possibly multifactorial as bp low, urinary retention, and BG elevated (prerenal) 4/30 renal function improved after IV fluids and  Foley placed    Diabetes type 2: A1c of 10.3   Takes Farxiga, metformin, Tresiba, dulaglutide at home. Blood glucose are improving Will increase NovoLog to 8 units per meal Continue Lantus 35 units  R-ISS    Coronary artery disease: Recently had coronary CTA which showed diffuse nonobstructive coronary artery disease.  On statin and Plavix at home. plavix d/cd Unable to take aspirin due to GI issues per daughter  Hypertension:  Now hypotensive. Initially BP meds were held, due to atrial fibrillation and rate control cardiology decrease midodrine, and if bp remains stable will d/c it 4/30stable.      Hyperlipidemia: continue statin  Anxiety/depression: On venlafaxine, on Ativan as needed  OSA: Does not use CPAP. D/w pt and husband need to get reassed for CPAP as outpt. GERD: Protonix     DVT prophylaxis:eliquis Code Status:full Family Communication:husband at bedside  Status is: Inpatient  Remains inpatient appropriate because:Inpatient level of care appropriate due to severity of illness   Dispo: The patient is from: Home              Anticipated d/c is to: SNF              Patient currently is not medically stable to d/c.   Difficult to place patient No            LOS: 6 days   Time spent: 35 minutes with more than 50% on [place], MD [facility] Pager [phone]  If 7PM-7AM, please contact night-coverage 12/10/2020, 9:17 AM

## 2020-12-10 NOTE — Progress Notes (Addendum)
DAILY PROGRESS NOTE   Patient Name: Alexis [name] Date of Encounter: 12/10/2020 Cardiologist: Pixie Casino, MD   Patient Profile   Alexis [name] is a 70 y.o. female with HLD,HTN,DM2,fatty liver disease, IBS,CAD, OSAand GERD who is hospitalized for L hip fx now s/p L THA who cardiology is consulted for post op AF.   Subjective   Continues to maintain NSR on tele today  Objective   Vitals:   12/09/20 1549 12/09/20 1957 12/09/20 2344 12/10/20 0330  BP: 133/62 138/65 (!) 160/69 138/65  Pulse: 80     Resp: 18 20  20   Temp: 99.1 F (37.3 C) 98.4 F (36.9 C) 98 F (36.7 C) 98.6 F (37 C)  TempSrc: Oral Oral Oral Oral  SpO2: 93%   96%  Weight:        Intake/Output Summary (Last 24 hours) at 12/10/2020 0734 Last data filed at 12/09/2020 2000 Gross per 24 hour  Intake 720 ml  Output 2750 ml  Net -2030 ml   Filed Weights   12/06/20 1400  Weight: 83.7 kg    Physical Exam  GEN: Well nourished, well developed in no acute distress HEENT: Normal NECK: No JVD; No carotid bruits LYMPHATICS: No lymphadenopathy CARDIAC:RRR, no murmurs, rubs, gallops RESPIRATORY:  Scattered crackles ABDOMEN: Soft, non-tender, non-distended MUSCULOSKELETAL:  1+ LLE edema; No deformity  SKIN: Warm and dry NEUROLOGIC:  Alert and oriented x 3 PSYCHIATRIC:  Normal affect    Inpatient Medications    Scheduled Meds: . apixaban  5 mg Oral BID  . atorvastatin  20 mg Oral QHS  . Chlorhexidine Gluconate Cloth  6 each Topical Daily  . docusate sodium  100 mg Oral BID  . feeding supplement (GLUCERNA SHAKE)  237 mL Oral BID BM  . insulin aspart  0-15 Units Subcutaneous TID WC  . insulin aspart  8 Units Subcutaneous TID WC  . insulin glargine  35 Units Subcutaneous Daily  . insulin glargine  5 Units Subcutaneous Once  . metoprolol succinate  100 mg Oral Daily  . midodrine  5 mg Oral BID WC  . pantoprazole  40 mg Oral BID  . polyethylene glycol  17 g Oral BID  . pregabalin  50 mg  Oral BID  . tamsulosin  0.4 mg Oral QHS  . venlafaxine XR  150 mg Oral BID    Continuous Infusions: . amiodarone 30 mg/hr (12/10/20 0327)    PRN Meds: acetaminophen, ALPRAZolam, diphenhydrAMINE, menthol-cetylpyridinium **OR** phenol, metoCLOPramide **OR** metoCLOPramide (REGLAN) injection, ondansetron **OR** ondansetron (ZOFRAN) IV, SUMAtriptan   Labs   Results for orders placed or performed during the hospital encounter of 12/04/20 (from the past 48 hour(s))  Glucose, capillary     Status: Abnormal   Collection Time: 12/08/20  8:34 AM  Result Value Ref Range   Glucose-Capillary 282 (H) 70 - 99 mg/dL    Comment: Glucose reference range applies only to samples taken after fasting for at least 8 hours.  Glucose, capillary     Status: Abnormal   Collection Time: 12/08/20 11:57 AM  Result Value Ref Range   Glucose-Capillary 342 (H) 70 - 99 mg/dL    Comment: Glucose reference range applies only to samples taken after fasting for at least 8 hours.  Basic metabolic panel     Status: Abnormal   Collection Time: 12/08/20 12:33 PM  Result Value Ref Range   Sodium 131 (L) 135 - 145 mmol/L   Potassium 3.5 3.5 - 5.1 mmol/L   Chloride  101 98 - 111 mmol/L   CO2 22 22 - 32 mmol/L   Glucose, Bld 310 (H) 70 - 99 mg/dL    Comment: Glucose reference range applies only to samples taken after fasting for at least 8 hours.   BUN 21 8 - 23 mg/dL   Creatinine, Ser 0.73 0.44 - 1.00 mg/dL   Calcium 8.2 (L) 8.9 - 10.3 mg/dL   GFR, Estimated >60 >60 mL/min    Comment: (NOTE) Calculated using the CKD-EPI Creatinine Equation (2021)    Anion gap 8 5 - 15    Comment: Performed at [facility] [address], [place], [place] [address]  CBC     Status: Abnormal   Collection Time: 12/08/20 12:33 PM  Result Value Ref Range   WBC 10.0 4.0 - 10.5 K/uL   RBC 3.43 (L) 3.87 - 5.11 MIL/uL   Hemoglobin 9.7 (L) 12.0 - 15.0 g/dL   HCT 28.5 (L) 36.0 - 46.0 %   MCV 83.1 80.0 - 100.0 fL   MCH 28.3 26.0 -  34.0 pg   MCHC 34.0 30.0 - 36.0 g/dL   RDW 16.3 (H) 11.5 - 15.5 %   Platelets 154 150 - 400 K/uL   nRBC 0.0 0.0 - 0.2 %    Comment: Performed at [facility] [address], [place], [place] [address]  Glucose, capillary     Status: Abnormal   Collection Time: 12/08/20  4:34 PM  Result Value Ref Range   Glucose-Capillary 201 (H) 70 - 99 mg/dL    Comment: Glucose reference range applies only to samples taken after fasting for at least 8 hours.  Magnesium     Status: None   Collection Time: 12/08/20  5:52 PM  Result Value Ref Range   Magnesium 2.1 1.7 - 2.4 mg/dL    Comment: Performed at [facility], [place] [address], [place], [place] [address]  Glucose, capillary     Status: Abnormal   Collection Time: 12/08/20  9:20 PM  Result Value Ref Range   Glucose-Capillary 261 (H) 70 - 99 mg/dL    Comment: Glucose reference range applies only to samples taken after fasting for at least 8 hours.  Basic metabolic panel     Status: Abnormal   Collection Time: 12/09/20  3:37 AM  Result Value Ref Range   Sodium 131 (L) 135 - 145 mmol/L   Potassium 3.6 3.5 - 5.1 mmol/L   Chloride 99 98 - 111 mmol/L   CO2 23 22 - 32 mmol/L   Glucose, Bld 249 (H) 70 - 99 mg/dL    Comment: Glucose reference range applies only to samples taken after fasting for at least 8 hours.   BUN 19 8 - 23 mg/dL   Creatinine, Ser 0.74 0.44 - 1.00 mg/dL   Calcium 8.3 (L) 8.9 - 10.3 mg/dL   GFR, Estimated >60 >60 mL/min    Comment: (NOTE) Calculated using the CKD-EPI Creatinine Equation (2021)    Anion gap 9 5 - 15    Comment: Performed at [facility] [address], [place], [place] [address]  CBC     Status: Abnormal   Collection Time: 12/09/20  3:37 AM  Result Value Ref Range   WBC 9.4 4.0 - 10.5 K/uL   RBC 3.32 (L) 3.87 - 5.11 MIL/uL   Hemoglobin 9.5 (L) 12.0 - 15.0 g/dL   HCT 27.8 (L) 36.0 - 46.0 %   MCV 83.7 80.0 - 100.0  fL   MCH 28.6 26.0 - 34.0 pg   MCHC 34.2 30.0 - 36.0 g/dL   RDW 16.8 (H)  11.5 - 15.5 %   Platelets 167 150 - 400 K/uL   nRBC 0.0 0.0 - 0.2 %    Comment: Performed at [facility] [address], [place], [place] [address]  Brain natriuretic peptide     Status: Abnormal   Collection Time: 12/09/20  3:37 AM  Result Value Ref Range   B Natriuretic Peptide 530.1 (H) 0.0 - 100.0 pg/mL    Comment: Performed at [facility] [address], [place], [place] [address]  Glucose, capillary     Status: Abnormal   Collection Time: 12/09/20  7:39 AM  Result Value Ref Range   Glucose-Capillary 220 (H) 70 - 99 mg/dL    Comment: Glucose reference range applies only to samples taken after fasting for at least 8 hours.  Glucose, capillary     Status: Abnormal   Collection Time: 12/09/20  1:14 PM  Result Value Ref Range   Glucose-Capillary 305 (H) 70 - 99 mg/dL    Comment: Glucose reference range applies only to samples taken after fasting for at least 8 hours.  Occult blood card to lab, stool RN will collect     Status: Abnormal   Collection Time: 12/09/20  2:44 PM  Result Value Ref Range   Fecal Occult Bld POSITIVE (A) NEGATIVE    Comment: Performed at [facility], [address] [place], [place] [address]  Glucose, capillary     Status: Abnormal   Collection Time: 12/09/20  5:01 PM  Result Value Ref Range   Glucose-Capillary 242 (H) 70 - 99 mg/dL    Comment: Glucose reference range applies only to samples taken after fasting for at least 8 hours.  Glucose, capillary     Status: Abnormal   Collection Time: 12/09/20  9:37 PM  Result Value Ref Range   Glucose-Capillary 231 (H) 70 - 99 mg/dL    Comment: Glucose reference range applies only to samples taken after fasting for at least 8 hours.  Basic metabolic panel     Status: Abnormal   Collection Time: 12/10/20  1:52 AM  Result Value Ref Range   Sodium 135 135 - 145 mmol/L   Potassium 3.3 (L) 3.5 - 5.1 mmol/L   Chloride 100 98 - 111 mmol/L   CO2 26 22 - 32 mmol/L   Glucose, Bld 219 (H) 70 - 99  mg/dL    Comment: Glucose reference range applies only to samples taken after fasting for at least 8 hours.   BUN 16 8 - 23 mg/dL   Creatinine, Ser 0.70 0.44 - 1.00 mg/dL   Calcium 8.2 (L) 8.9 - 10.3 mg/dL   GFR, Estimated >60 >60 mL/min    Comment: (NOTE) Calculated using the CKD-EPI Creatinine Equation (2021)    Anion gap 9 5 - 15    Comment: Performed at [facility] [address], [place], [place] [address]    ECG   N/A  Telemetry   Sinus rhythm - Personally Reviewed  Radiology    DG CHEST PORT 1 VIEW  Result Date: 12/09/2020 CLINICAL DATA:  Shortness of breath. EXAM: PORTABLE CHEST 1 VIEW COMPARISON:  12/06/2020 FINDINGS: 1119 hours. The cardio pericardial silhouette is enlarged. There is pulmonary vascular congestion without overt pulmonary edema. Interstitial markings are diffusely coarsened with chronic features. Retrocardiac atelectasis or infiltrate noted. IMPRESSION: Retrocardiac left  base atelectasis or infiltrate. Cardiomegaly with vascular congestion. Electronically Signed   By: Kennith Center M.D.   On: 12/09/2020 13:11   VAS [place] UPPER EXTREMITY VENOUS DUPLEX  Result Date: 12/09/2020 UPPER VENOUS STUDY  Patient Name:  LYNDON FISS  Date of Exam:   12/08/2020 Medical Rec #: [id number]          Accession #:    [id number] Date of Birth: 12/30/1950          Patient Gender: F Patient Age:   70Y Exam Location:  [facility] Procedure:      VAS [place] UPPER EXTREMITY VENOUS DUPLEX Referring Phys: [id number] [facility] AMERY --------------------------------------------------------------------------------  Indications: Right hand swelling Comparison Study: No prior study Performing Technologist: Gertie Fey MHA, RDMS, RVT, RDCS  Examination Guidelines: A complete evaluation includes B-mode imaging, spectral Doppler, color Doppler, and power Doppler as needed of all accessible portions of each vessel. Bilateral testing is considered an integral part of a complete  examination. Limited examinations for reoccurring indications may be performed as noted.  Right Findings: +----------+------------+---------+-----------+----------+-------------+ RIGHT     CompressiblePhasicitySpontaneousProperties   Summary    +----------+------------+---------+-----------+----------+-------------+ IJV           Full       Yes       Yes                            +----------+------------+---------+-----------+----------+-------------+ Subclavian    Full       Yes       Yes                            +----------+------------+---------+-----------+----------+-------------+ Axillary      Full       Yes       Yes              Rouleaux flow +----------+------------+---------+-----------+----------+-------------+ Brachial      Full       Yes       Yes              Rouleaux flow +----------+------------+---------+-----------+----------+-------------+ Radial        Full                                                +----------+------------+---------+-----------+----------+-------------+ Ulnar         Full                                                +----------+------------+---------+-----------+----------+-------------+ Cephalic      Full                                                +----------+------------+---------+-----------+----------+-------------+ Basilic       Full                                                +----------+------------+---------+-----------+----------+-------------+  Left  Findings: +----------+------------+---------+-----------+----------+-------+ LEFT      CompressiblePhasicitySpontaneousPropertiesSummary +----------+------------+---------+-----------+----------+-------+ Subclavian               Yes       Yes                      +----------+------------+---------+-----------+----------+-------+  Summary:  Right: No evidence of deep vein thrombosis in the upper extremity. No evidence of superficial  vein thrombosis in the upper extremity.  Left: No evidence of thrombosis in the subclavian.  *See table(s) above for measurements and observations.  Diagnosing physician: Harold Barban MD Electronically signed by Harold Barban MD on 12/09/2020 at 4:15:19 PM.    Final     Cardiac Studies   See above   Assessment/Plan   Post op afib -has been in and out of PA -now back in NSR and maintaining on IV Amio -change to Amio 200mg  BID today -continue Toprol XL 100mg  daily and apixaban 5mg  BID -follow on tele  Hypokalemia -keep K+>4 -K+ 3.3 today -replete   Acute diastolic CHF -Cxray yesterday vascular congestion -BNP mildly elevated at 530 -given Lasix 20mg  IV yesterday -she put out 2.7L yesterday and is net neg 3.3L since admit -still SOB with some crackles and LE edema -give Lasix 40mg  IV this am -follow renal function, daily weights and I&O's  I have spent a total of 30 minutes with patient reviewing 2D echo , telemetry, EKGs, labs and examining patient as well as establishing an assessment and plan that was discussed with the patient.  > 50% of time was spent in direct patient care.    Channing Savich 12/10/2020, 7:34 AM

## 2020-12-10 NOTE — TOC Progression Note (Signed)
Transition of Care [facility]) - Progression Note    Patient Details  Name: Alexis [name] MRN: [id number] Date of Birth: Dec 05, 1950  Transition of [facility]) CM/SW [place], LCSW Phone Number: 12/10/2020, 11:22 AM  Clinical Narrative:    CSW met with patient and spouse to follow-up on considering referrals to other facilities. CSW noted patient presented in a positive demeanor. CSW inquired if they had time to discuss and noted they re-affirmed [facility] only as the rest of the facilities were too far for them. CSW reviewed the need for additional referrals and noted patient and family requested to follow-up Monday as their daughter is planning on calling [facility] to see. CSW informed them a referral has been sent in and is awaiting their admissions team to review it.    Expected Discharge Plan: Skilled Nursing Facility Barriers to Discharge: SNF Pending bed offer  Expected Discharge Plan and Services Expected Discharge Plan: [facility] Choice: [facility]                                         Social Determinants of Health (SDOH) Interventions    Readmission Risk Interventions No flowsheet data found.

## 2020-12-11 DIAGNOSIS — I2510 Atherosclerotic heart disease of native coronary artery without angina pectoris: Secondary | ICD-10-CM | POA: Diagnosis not present

## 2020-12-11 DIAGNOSIS — E7800 Pure hypercholesterolemia, unspecified: Secondary | ICD-10-CM

## 2020-12-11 DIAGNOSIS — I480 Paroxysmal atrial fibrillation: Secondary | ICD-10-CM | POA: Diagnosis not present

## 2020-12-11 DIAGNOSIS — S72012P Unspecified intracapsular fracture of left femur, subsequent encounter for closed fracture with malunion: Secondary | ICD-10-CM | POA: Diagnosis not present

## 2020-12-11 DIAGNOSIS — E876 Hypokalemia: Secondary | ICD-10-CM | POA: Diagnosis not present

## 2020-12-11 DIAGNOSIS — G9341 Metabolic encephalopathy: Secondary | ICD-10-CM | POA: Diagnosis not present

## 2020-12-11 DIAGNOSIS — D62 Acute posthemorrhagic anemia: Secondary | ICD-10-CM | POA: Diagnosis not present

## 2020-12-11 DIAGNOSIS — F419 Anxiety disorder, unspecified: Secondary | ICD-10-CM | POA: Diagnosis not present

## 2020-12-11 LAB — BASIC METABOLIC PANEL
Anion gap: 7 (ref 5–15)
BUN: 14 mg/dL (ref 8–23)
CO2: 28 mmol/L (ref 22–32)
Calcium: 8.3 mg/dL — ABNORMAL LOW (ref 8.9–10.3)
Chloride: 100 mmol/L (ref 98–111)
Creatinine, Ser: 0.65 mg/dL (ref 0.44–1.00)
GFR, Estimated: >60 mL/min (ref >60)
Glucose, Bld: 181 mg/dL — ABNORMAL HIGH (ref 70–99)
Potassium: 3.5 mmol/L (ref 3.5–5.1)
Sodium: 135 mmol/L (ref 135–145)

## 2020-12-11 LAB — GLUCOSE, CAPILLARY
Glucose-Capillary: 212 mg/dL — ABNORMAL HIGH (ref 70–99)
Glucose-Capillary: 195 mg/dL — ABNORMAL HIGH (ref 70–99)
Glucose-Capillary: 217 mg/dL — ABNORMAL HIGH (ref 70–99)
Glucose-Capillary: 185 mg/dL — ABNORMAL HIGH (ref 70–99)
Glucose-Capillary: 214 mg/dL — ABNORMAL HIGH (ref 70–99)

## 2020-12-11 MED ORDER — INSULIN ASPART 100 UNIT/ML IJ SOLN
4.0000 [IU] | Freq: Three times a day (TID) | INTRAMUSCULAR | Status: DC
  Administered 2020-12-11 – 2020-12-12 (×4): 4 [IU] via SUBCUTANEOUS

## 2020-12-11 MED ORDER — FUROSEMIDE 20 MG PO TABS
20.0000 mg | ORAL_TABLET | Freq: Every day | ORAL | Status: DC
  Administered 2020-12-11 – 2020-12-12 (×2): 20 mg via ORAL
  Filled 2020-12-11 (×2): qty 1

## 2020-12-11 NOTE — Progress Notes (Signed)
PROGRESS NOTE    Alexis [name]  [id number] DOB: November 30, 1950 DOA: 12/04/2020 PCP: Deland Pretty, MD    Brief Narrative:  Patient is a 70 year old female with history of hypertension, hyperlipidemia, coronary artery disease, diabetes type 2 with peripheral neuropathy, uterine cancer status post hysterectomy, OSA  from home after falling at home.  No history of loss of consciousness or head injury.  After the fall ,she immediately developed pain on the right hip and was unable to bear weight.  On presentation, she was hypertensive, x-ray showed subcapital fracture of the left femur.  Orthopedics consulted and plan is for ORIF  4/28-overnight issues were noted.  Early this morning patient was in atrial fibrillation, status post 1 unit blood transfusion.  Cardiology was consulted.  4/29-pt appears better than other days. Family concerned pt appears to be confused at times. During my exam, answers more appropriately and oriented, which family does agree pt is better than previous days. Hold off on Mri brain for now which family is agreeable with this plan.   4/30-husband at bedside reports patient is doing much better today.  No confusion.  Answering appropriately.  Patient also reports feeling much better.  Pain controlled.  Finally had bowel movement Currently in sinus rhythm  5/1-had 4 bowel movements yesterday.  This a.m. 1 bowel movement.  Good urine output with 20 mg of IV Lasix yesterday, net -3.3 since admission  5/2-feels well. Sob better off 0xygen.   Consultants:   orthopedics, cardiology  Procedures:   Antimicrobials:    vanco, cefep, meto 4/27-4/31   Subjective: Sob has improved compared to other days.  No chest pain no palpitations no dizziness   This SmartLink has not been configured with any valid records.    Objective: Vitals:   12/10/20 1500 12/10/20 1945 12/11/20 0004 12/11/20 0352  BP: (!) 137/58 (!) 142/63    Pulse: 78     Resp: 18 20  20   Temp: 98.9  F (37.2 C) 98.4 F (36.9 C) 98.4 F (36.9 C) 98 F (36.7 C)  TempSrc: Oral Oral Oral Oral  SpO2: 92% 94%  99%  Weight:       No intake or output data in the 24 hours ending 12/11/20 0815 Filed Weights   12/06/20 1400  Weight: 83.7 kg    Examination: Calm, NAD Minimal scattered Rales barely audible at bases Regular S1-S2 no gallops Soft benign positive bowel sounds Positive pitting edema bilaterally Awake and alert, grossly intact  Data Reviewed: I have personally reviewed following labs and imaging studies  CBC: Recent Labs  Lab 12/06/20 0256 12/06/20 1709 12/06/20 2124 12/07/20 0826 12/08/20 1233 12/09/20 0337  WBC 13.0* 9.3 9.5 10.5 10.0 9.4  NEUTROABS 10.5*  --  7.1  --   --   --   HGB 8.1* 7.0* 6.4* 9.5* 9.7* 9.5*  HCT 25.9* 22.3* 20.1* 28.8* 28.5* 27.8*  MCV 85.2 83.5 82.4 85.0 83.1 83.7  PLT 185 154 142* 131* 154 [id number]   Basic Metabolic Panel: Recent Labs  Lab 12/06/20 2124 12/07/20 0826 12/08/20 1233 12/08/20 1752 12/09/20 0337 12/10/20 0152  NA 128* 131* 131*  --  131* 135  K 3.6 3.9 3.5  --  3.6 3.3*  CL 99 99 101  --  99 100  CO2 23 22 22   --  23 26  GLUCOSE 341* 358* 310*  --  249* 219*  BUN 47* 41* 21  --  19 16  CREATININE 1.50* 1.22* 0.73  --  0.74 0.70  CALCIUM 7.5* 7.9* 8.2*  --  8.3* 8.2*  MG  --  1.7  --  2.1  --   --    GFR: Estimated Creatinine Clearance: 70.9 mL/min (by C-G formula based on SCr of 0.7 mg/dL). Liver Function Tests: Recent Labs  Lab 12/06/20 2124  AST 29  ALT 9  ALKPHOS 57  BILITOT 0.6  PROT 4.5*  ALBUMIN 2.1*   No results for input(s): LIPASE, AMYLASE in the last 168 hours. No results for input(s): AMMONIA in the last 168 hours. Coagulation Profile: No results for input(s): INR, PROTIME in the last 168 hours. Cardiac Enzymes: No results for input(s): CKTOTAL, CKMB, CKMBINDEX, TROPONINI in the last 168 hours. BNP (last 3 results) No results for input(s): PROBNP in the last 8760 hours. HbA1C: No  results for input(s): HGBA1C in the last 72 hours. CBG: Recent Labs  Lab 12/10/20 0733 12/10/20 1202 12/10/20 1532 12/10/20 2237 12/11/20 0733  GLUCAP 178* 200* 243* 251* 212*   Lipid Profile: No results for input(s): CHOL, HDL, LDLCALC, TRIG, CHOLHDL, LDLDIRECT in the last 72 hours. Thyroid Function Tests: No results for input(s): TSH, T4TOTAL, FREET4, T3FREE, THYROIDAB in the last 72 hours. Anemia Panel: No results for input(s): VITAMINB12, FOLATE, FERRITIN, TIBC, IRON, RETICCTPCT in the last 72 hours. Sepsis Labs: Recent Labs  Lab 12/06/20 2124  LATICACIDVEN 1.4    Recent Results (from the past 240 hour(s))  Resp Panel by RT-PCR (Flu A&B, Covid) Nasopharyngeal Swab     Status: None   Collection Time: 12/04/20  8:13 AM   Specimen: Nasopharyngeal Swab; Nasopharyngeal(NP) swabs in vial transport medium  Result Value Ref Range Status   SARS Coronavirus 2 by RT PCR NEGATIVE NEGATIVE Final    Comment: (NOTE) SARS-CoV-2 target nucleic acids are NOT DETECTED.  The SARS-CoV-2 RNA is generally detectable in upper respiratory specimens during the acute phase of infection. The lowest concentration of SARS-CoV-2 viral copies this assay can detect is 138 copies/mL. A negative result does not preclude SARS-Cov-2 infection and should not be used as the sole basis for treatment or other patient management decisions. A negative result may occur with  improper specimen collection/handling, submission of specimen other than nasopharyngeal swab, presence of viral mutation(s) within the areas targeted by this assay, and inadequate number of viral copies(<138 copies/mL). A negative result must be combined with clinical observations, patient history, and epidemiological information. The expected result is Negative.  Fact Sheet for Patients:  EntrepreneurPulse.com.au  Fact Sheet for Healthcare Providers:  IncredibleEmployment.be  This test is no t yet  approved or cleared by the [place] FDA and  has been authorized for detection and/or diagnosis of SARS-CoV-2 by FDA under an Emergency Use Authorization (EUA). This EUA will remain  in effect (meaning this test can be used) for the duration of the COVID-19 declaration under Section 564(b)(1) of the Act, 21 U.S.C.section 360bbb-3(b)(1), unless the authorization is terminated  or revoked sooner.       Influenza A by PCR NEGATIVE NEGATIVE Final   Influenza B by PCR NEGATIVE NEGATIVE Final    Comment: (NOTE) The Xpert Xpress SARS-CoV-2/FLU/RSV plus assay is intended as an aid in the diagnosis of influenza from Nasopharyngeal swab specimens and should not be used as a sole basis for treatment. Nasal washings and aspirates are unacceptable for Xpert Xpress SARS-CoV-2/FLU/RSV testing.  Fact Sheet for Patients: EntrepreneurPulse.com.au  Fact Sheet for Healthcare Providers: IncredibleEmployment.be  This test is not yet approved or cleared by the [place]  States FDA and has been authorized for detection and/or diagnosis of SARS-CoV-2 by FDA under an Emergency Use Authorization (EUA). This EUA will remain in effect (meaning this test can be used) for the duration of the COVID-19 declaration under Section 564(b)(1) of the Act, 21 U.S.C. section 360bbb-3(b)(1), unless the authorization is terminated or revoked.  Performed at [facility], [place] [address], [place], [place] [address]   Culture, Urine     Status: None   Collection Time: 12/07/20 11:59 AM   Specimen: Urine, Random  Result Value Ref Range Status   Specimen Description URINE, RANDOM  Final   Special Requests NONE  Final   Culture   Final    NO GROWTH Performed at [facility], [place] [address] [place], [place] [address]    Report Status 12/08/2020 FINAL  Final  Culture, blood (routine x 2)     Status: None (Preliminary result)   Collection Time: 12/07/20  1:17 PM    Specimen: BLOOD  Result Value Ref Range Status   Specimen Description BLOOD RIGHT ANTECUBITAL  Final   Special Requests   Final    BOTTLES DRAWN AEROBIC AND ANAEROBIC Blood Culture adequate volume   Culture   Final    NO GROWTH 3 DAYS Performed at [facility], [place] [address], [place], [place] [address]    Report Status PENDING  Incomplete  Culture, blood (routine x 2)     Status: None (Preliminary result)   Collection Time: 12/07/20  1:17 PM   Specimen: BLOOD  Result Value Ref Range Status   Specimen Description BLOOD BLOOD LEFT HAND  Final   Special Requests   Final    BOTTLES DRAWN AEROBIC AND ANAEROBIC Blood Culture adequate volume   Culture   Final    NO GROWTH 3 DAYS Performed at [facility], [place] [address], [place], [place] [address]    Report Status PENDING  Incomplete         Radiology Studies: DG CHEST PORT 1 VIEW  Result Date: 12/09/2020 CLINICAL DATA:  Shortness of breath. EXAM: PORTABLE CHEST 1 VIEW COMPARISON:  12/06/2020 FINDINGS: 1119 hours. The cardio pericardial silhouette is enlarged. There is pulmonary vascular congestion without overt pulmonary edema. Interstitial markings are diffusely coarsened with chronic features. Retrocardiac atelectasis or infiltrate noted. IMPRESSION: Retrocardiac left base atelectasis or infiltrate. Cardiomegaly with vascular congestion. Electronically Signed   By: Misty Stanley M.D.   On: 12/09/2020 13:11        Scheduled Meds: . amiodarone  200 mg Oral BID  . apixaban  5 mg Oral BID  . atorvastatin  20 mg Oral QHS  . Chlorhexidine Gluconate Cloth  6 each Topical Daily  . feeding supplement (GLUCERNA SHAKE)  237 mL Oral BID BM  . insulin aspart  0-15 Units Subcutaneous TID WC  . insulin aspart  4 Units Subcutaneous TID WC  . insulin glargine  35 Units Subcutaneous Daily  . insulin glargine  5 Units Subcutaneous Once  . metoprolol succinate  100 mg Oral Daily  . midodrine  2.5 mg Oral BID WC  . pantoprazole   40 mg Oral BID  . polyethylene glycol  17 g Oral Daily  . pregabalin  50 mg Oral BID  . tamsulosin  0.4 mg Oral QHS  . venlafaxine XR  150 mg Oral BID   Continuous Infusions:   Assessment & Plan:   Principal Problem:   Subcapital fracture of left femur (HCC) Active  Problems:   Hyperlipidemia   Anxiety and depression   OSA (obstructive sleep apnea)   Coronary artery calcification seen on computed tomography   Type 2 diabetes mellitus (HCC)   Leukocytosis   Fall at home, initial encounter   Hypotension   Acute metabolic encephalopathy   Paroxysmal atrial fibrillation (HCC)   Acute blood loss anemia   Left subcapital femur fracture: POD2  Secondary to mechanical fall  Status post left total hip arthroplasty on 4/26 PT recommends SNF Per Ortho Dr. Lyla Glassing okay to start Lovenox for DVT prophylaxis via chat 4/28-Ortho will like patient to be on aspirin 81 mg twice daily at discharge however patient is allergic to aspirin we will discuss this with him. 5/2 WBAT with walker  Need to follow-up with orthopedics SNF pending        Leukocytosi/low grade fever Afebrile  Leukocytosis resolved  Blood cultures negative, dose antibiotics will discontinue previously       PAF/RVR- Was back in afib on 4/29. Now in sinus rhythm Cardiology following Likely multifactorial has OSA with profound anemia post op when first occurs happened. Was started on beta blk and eliquis Echo nml EF, no wall motion abnormalities plavix home dose was discontinued since started on Eliquis Anemia corrected Was started on iv amiodarone yesterday when was in afib rvr. Per cardiology continue IV amiodarone and transition to p.o. likely tomorrow Is mildly volume overloaded, on 2L , likely from blood, ivf, iv meds. BNP elevated. Will give low dose lasix 20mg  iv x1. 5/2-on amiodarone 200 mg twice daily Continue Lasix 20 mg daily Eliquis 5 mg twice daily Toprol-XL 100 mg daily Needs BMP checked in  1 week Follow Dr. Debara Pickett in 10 day     Post op blood loss-  Hemoglobin was 6.4 ,s/p 2units PRBC 5/1-stool occult postive Spoke to Dr. Henrene Pastor via chat about stool occult +,but since pt without any obvious bloody stool, PRBPR, and Hg stable, he thought blood loss from surgery. Her recommended just following pt and if any obvious bleeding then reconsult him. 5/2-Hg stable so far. Will ck periodically   Urinary retention-post op +BM Continue flomax Foley was d/c;'d and voiding fine  Hypokalemia-mild.  3.3 Was replaced, am labs pending    AKI:  Renal function worsening. NO void. Possibly multifactorial as bp low, urinary retention, and BG elevated (prerenal) 4/30 renal function improved after IV fluids and Foley placed    Diabetes type 2: A1c of 10.3   Takes Farxiga, metformin, Tresiba, dulaglutide at home. Blood glucose are improving Will increase NovoLog to 8 units per meal Continue Lantus 35 units  R-ISS    Coronary artery disease: Recently had coronary CTA which showed diffuse nonobstructive coronary artery disease.  On statin and Plavix at home. plavix d/cd Unable to take aspirin due to GI issues per daughter  Hypertension:  Now hypotensive. Initially BP meds were held, due to atrial fibrillation and rate control cardiology decrease midodrine, and if bp remains stable will d/c it 4/30stable.      Hyperlipidemia: continue statin  Anxiety/depression: On venlafaxine, on Ativan as needed  OSA: needs to f/u with pcp for cpap  GERD: Protonix     DVT prophylaxis:eliquis Code Status:full Family Communication:husband and daughter at bedside  Status is: Inpatient  Remains inpatient appropriate because:Inpatient level of care appropriate due to severity of illness   Dispo: The patient is from: Home              Anticipated d/c is to: SNF  Patient currently is not medically stable to d/c.   Difficult to place patient No             LOS: 7 days   Time spent: 35 minutes with more than 50% on [place], MD [facility] Pager [phone]  If 7PM-7AM, please contact night-coverage 12/11/2020, 8:15 AM

## 2020-12-11 NOTE — Plan of Care (Signed)
  Problem: Education: Goal: Knowledge of General Education information will improve Description Including pain rating scale, medication(s)/side effects and non-pharmacologic comfort measures Outcome: Progressing   Problem: Health Behavior/Discharge Planning: Goal: Ability to manage health-related needs will improve Outcome: Progressing   

## 2020-12-11 NOTE — TOC Progression Note (Signed)
Transition of Care [facility]) - Progression Note    Patient Details  Name: Alexis [name] MRN: [id number] Date of Birth: 11-28-1950  Transition of Care [facility]) CM/SW [place], [place] Phone Number: 12/11/2020, 12:24 PM  Clinical Narrative:     Josem Kaufmann started; Ref# [id number]  Expected Discharge Plan: [place] Barriers to Discharge: SNF Pending bed offer  Expected Discharge Plan and Services Expected Discharge Plan: [place] Choice: [place]                                         Social Determinants of Health (SDOH) Interventions    Readmission Risk Interventions No flowsheet data found.

## 2020-12-11 NOTE — Progress Notes (Signed)
DAILY PROGRESS NOTE   Patient Name: Alexis [name] Date of Encounter: 12/11/2020 Cardiologist: Pixie Casino, MD   Patient Profile   Alexis [name] is a 70 y.o. female with HLD,HTN,DM2,fatty liver disease, IBS,CAD, OSAand GERD who is hospitalized for L hip fx now s/p L THA who cardiology is consulted for post op AF.   Subjective   Denies any chest pain or SOB.  Remains in NSR on tele  Objective   Vitals:   12/10/20 1500 12/10/20 1945 12/11/20 0004 12/11/20 0352  BP: (!) 137/58 (!) 142/63    Pulse: 78     Resp: 18 20  20   Temp: 98.9 F (37.2 C) 98.4 F (36.9 C) 98.4 F (36.9 C) 98 F (36.7 C)  TempSrc: Oral Oral Oral Oral  SpO2: 92% 94%  99%  Weight:       No intake or output data in the 24 hours ending 12/11/20 0807 Filed Weights   12/06/20 1400  Weight: 83.7 kg    Physical Exam   GEN: Well nourished, well developed in no acute distress HEENT: Normal NECK: No JVD; No carotid bruits LYMPHATICS: No lymphadenopathy CARDIAC:RRR, no murmurs, rubs, gallops RESPIRATORY:  Clear to auscultation without rales, wheezing or rhonchi  ABDOMEN: Soft, non-tender, non-distended MUSCULOSKELETAL:  No edema; No deformity  SKIN: Warm and dry NEUROLOGIC:  Alert and oriented x 3 PSYCHIATRIC:  Normal affect   Inpatient Medications    Scheduled Meds: . amiodarone  200 mg Oral BID  . apixaban  5 mg Oral BID  . atorvastatin  20 mg Oral QHS  . Chlorhexidine Gluconate Cloth  6 each Topical Daily  . feeding supplement (GLUCERNA SHAKE)  237 mL Oral BID BM  . insulin aspart  0-15 Units Subcutaneous TID WC  . insulin aspart  8 Units Subcutaneous TID WC  . insulin glargine  35 Units Subcutaneous Daily  . insulin glargine  5 Units Subcutaneous Once  . metoprolol succinate  100 mg Oral Daily  . midodrine  2.5 mg Oral BID WC  . pantoprazole  40 mg Oral BID  . polyethylene glycol  17 g Oral Daily  . pregabalin  50 mg Oral BID  . tamsulosin  0.4 mg Oral QHS  . venlafaxine  XR  150 mg Oral BID    Continuous Infusions:   PRN Meds: acetaminophen, ALPRAZolam, diphenhydrAMINE, docusate sodium, menthol-cetylpyridinium **OR** phenol, metoCLOPramide **OR** metoCLOPramide (REGLAN) injection, ondansetron **OR** ondansetron (ZOFRAN) IV, SUMAtriptan   Labs   Results for orders placed or performed during the hospital encounter of 12/04/20 (from the past 48 hour(s))  Glucose, capillary     Status: Abnormal   Collection Time: 12/09/20  1:14 PM  Result Value Ref Range   Glucose-Capillary 305 (H) 70 - 99 mg/dL    Comment: Glucose reference range applies only to samples taken after fasting for at least 8 hours.  Occult blood card to lab, stool RN will collect     Status: Abnormal   Collection Time: 12/09/20  2:44 PM  Result Value Ref Range   Fecal Occult Bld POSITIVE (A) NEGATIVE    Comment: Performed at [facility], [address], [place], [place] [address]  Glucose, capillary     Status: Abnormal   Collection Time: 12/09/20  5:01 PM  Result Value Ref Range   Glucose-Capillary 242 (H) 70 - 99 mg/dL    Comment: Glucose reference range applies only to samples taken after fasting for at least 8 hours.  Glucose,  capillary     Status: Abnormal   Collection Time: 12/09/20  9:37 PM  Result Value Ref Range   Glucose-Capillary 231 (H) 70 - 99 mg/dL    Comment: Glucose reference range applies only to samples taken after fasting for at least 8 hours.  Basic metabolic panel     Status: Abnormal   Collection Time: 12/10/20  1:52 AM  Result Value Ref Range   Sodium 135 135 - 145 mmol/L   Potassium 3.3 (L) 3.5 - 5.1 mmol/L   Chloride 100 98 - 111 mmol/L   CO2 26 22 - 32 mmol/L   Glucose, Bld 219 (H) 70 - 99 mg/dL    Comment: Glucose reference range applies only to samples taken after fasting for at least 8 hours.   BUN 16 8 - 23 mg/dL   Creatinine, Ser 0.70 0.44 - 1.00 mg/dL   Calcium 8.2 (L) 8.9 - 10.3 mg/dL   GFR, Estimated >60 >60 mL/min    Comment:  (NOTE) Calculated using the CKD-EPI Creatinine Equation (2021)    Anion gap 9 5 - 15    Comment: Performed at [facility] [address], [place], [place] [address]  Glucose, capillary     Status: Abnormal   Collection Time: 12/10/20  7:33 AM  Result Value Ref Range   Glucose-Capillary 178 (H) 70 - 99 mg/dL    Comment: Glucose reference range applies only to samples taken after fasting for at least 8 hours.   Comment 1 Notify RN    Comment 2 Document in Chart   Glucose, capillary     Status: Abnormal   Collection Time: 12/10/20 12:02 PM  Result Value Ref Range   Glucose-Capillary 200 (H) 70 - 99 mg/dL    Comment: Glucose reference range applies only to samples taken after fasting for at least 8 hours.   Comment 1 Notify RN    Comment 2 Document in Chart   Glucose, capillary     Status: Abnormal   Collection Time: 12/10/20  3:32 PM  Result Value Ref Range   Glucose-Capillary 243 (H) 70 - 99 mg/dL    Comment: Glucose reference range applies only to samples taken after fasting for at least 8 hours.   Comment 1 Notify RN    Comment 2 Document in Chart   Glucose, capillary     Status: Abnormal   Collection Time: 12/10/20 10:37 PM  Result Value Ref Range   Glucose-Capillary 251 (H) 70 - 99 mg/dL    Comment: Glucose reference range applies only to samples taken after fasting for at least 8 hours.  Glucose, capillary     Status: Abnormal   Collection Time: 12/11/20  7:33 AM  Result Value Ref Range   Glucose-Capillary 212 (H) 70 - 99 mg/dL    Comment: Glucose reference range applies only to samples taken after fasting for at least 8 hours.   Comment 1 Notify RN    Comment 2 Document in Chart     ECG   N/A  Telemetry   Sinus rhythm - Personally Reviewed  Radiology    DG CHEST PORT 1 VIEW  Result Date: 12/09/2020 CLINICAL DATA:  Shortness of breath. EXAM: PORTABLE CHEST 1 VIEW COMPARISON:  12/06/2020 FINDINGS: 1119 hours. The cardio pericardial silhouette is enlarged.  There is pulmonary vascular congestion without overt pulmonary edema. Interstitial markings are diffusely coarsened with chronic features. Retrocardiac atelectasis or infiltrate noted. IMPRESSION: Retrocardiac left base atelectasis or infiltrate. Cardiomegaly with vascular congestion.  Electronically Signed   By: Misty Stanley M.D.   On: 12/09/2020 13:11    Cardiac Studies   See above   Assessment/Plan   Post op afib -has been in and out of PA -now back in NSR -continue on Amio 200mg  BID -continue Toprol XL 100mg  daily and apixaban 5mg  BID  Hypokalemia -keep K+>4 -K+ 3.3 yesterday -BMET pending today  Acute diastolic CHF -Cxray showed vascular congestion -BNP was mildly elevated at 530 -I&O's incomplete but net - at least 3.2 L since admission -lungs are clear and only trace LE edema -will change to PO Lasix 20mg  daily -follow renal function, daily weights and I&O's  [facility] will sign off.   Medication Recommendations:  Amiodarone 200mg  BID, Lasix 20mg  daily, Apixaban 5mg  BID, Atorvastatin 20mg  daily, Toprol XL 100mg  daily Other recommendations (labs, testing, etc):  BMET 1 week Follow up as an outpatient:  Dr. Debara Pickett in 10-14 days  Fransico Him 12/11/2020, 8:07 AM

## 2020-12-11 NOTE — TOC Progression Note (Signed)
Transition of Care [facility]) - Progression Note    Patient Details  Name: Alexis [name] MRN: [id number] Date of Birth: [birth year]  Transition of Care [facility]) CM/SW [place], [place] Phone Number: 12/11/2020, 11:09 AM  Clinical Narrative:     Sent message top [facility] to follow up and referral - waiting on response.  Thurmond Butts, MSW, LCSW Clinical Social Worker   Expected Discharge Plan: Skilled Nursing Facility Barriers to Discharge: SNF Pending bed offer  Expected Discharge Plan and Services Expected Discharge Plan: [place] Choice: [place]                                         Social Determinants of Health (SDOH) Interventions    Readmission Risk Interventions No flowsheet data found.

## 2020-12-11 NOTE — TOC Progression Note (Signed)
Transition of Care [facility]) - Progression Note    Patient Details  Name: Alexis [name] MRN: [id number] Date of Birth: 1951/02/13  Transition of Care [facility]) CM/SW [place], [place] Phone Number: 12/11/2020, 2:40 PM  Clinical Narrative:     [facility] confirmed bed offer- Patient and family accepted.   Insurance auth pending  Thurmond Butts, MSW, LCSW Clinical Social Worker   Expected Discharge Plan: Skilled Nursing Facility Barriers to Discharge: SNF Pending bed offer  Expected Discharge Plan and Services Expected Discharge Plan: [place] Choice: [place]                                         Social Determinants of Health (SDOH) Interventions    Readmission Risk Interventions No flowsheet data found.

## 2020-12-11 NOTE — Progress Notes (Signed)
Physical Therapy Treatment Patient Details Name: Alexis [name] MRN: [id number] DOB: [birth year] Today's Date: 12/11/2020    History of Present Illness Pt is a 70 yo female who fell and sustained a L hip fracture who then underwent a L THA, anterior approach. PMH: depression, DM II, anxiety PSH: ACDF 04/2018    PT Comments    The pt was able to make great progress with PT this session, completing multiple short bouts of ambulation with seated rest in between at this point. The pt continues to require modA to stand with increased time to power up from sitting, cues for hip extension, posture, and hand placement. The pt was able to significantly progress ambulation distance, but continues to present with decreased wt shift to L due to pain, and utilized antalgic, step-to pattern. The pt benefits from cues for increased stride length, and was able to complete short bouts without repeated cues at this time. The pt will continue to benefit from skilled PT to progress endurance and functional strength and stability to allow for improved independence with transfers and ambulation distance. The pt was able to complete today's full session on RA with SpO2 maintained 90-94% with mobility, and 93-97% on RA at rest. Continue to recommend short stint SNF rehab prior to return home.    Follow Up Recommendations  SNF;Supervision/Assistance - 24 hour     Equipment Recommendations  Rolling walker with 5" wheels    Recommendations for Other Services       Precautions / Restrictions Precautions Precautions: Fall Precaution Comments: watch HR Restrictions Weight Bearing Restrictions: Yes LLE Weight Bearing: Weight bearing as tolerated    Mobility  Bed Mobility Overal bed mobility: Needs Assistance Bed Mobility: Rolling;Sit to Sidelying Rolling: Min guard       Sit to sidelying: Min assist General bed mobility comments: minA to bring BLE into bed, but pt with good initiation. Pt asking repeated  questions about technique "so I come down onto this shoulder?" but able to follow commands without physical assist    Transfers Overall transfer level: Needs assistance Equipment used: Rolling walker (2 wheeled) Transfers: Sit to/from [facility] Sit to Stand: Mod assist Stand pivot transfers: Mod assist       General transfer comment: modA of 1 initially, with progression to modA of 2 with fatigue. increased time to power up with cues for hip extension, hand placement, posture. x4 in session  Ambulation/Gait Ambulation/Gait assistance: Mod assist;Min assist Gait Distance (Feet): 8 Feet (+8 ft) Assistive device: Rolling walker (2 wheeled) Gait Pattern/deviations: Step-to pattern;Decreased weight shift to left;Decreased stance time - left;Decreased step length - right;Decreased stride length;Shuffle;Trunk flexed Gait velocity: decreased Gait velocity interpretation: <1.31 ft/sec, indicative of household ambulator General Gait Details: pt able to complete x2 short bouts of ambulation in the room with min to modA to steady, manage RW, facilitation of wt shift to L to increase clearance of RLE       Balance Overall balance assessment: Needs assistance Sitting-balance support: No upper extremity supported;Feet supported Sitting balance-Leahy Scale: Fair Sitting balance - Comments: static sitting without UE support   Standing balance support: Bilateral upper extremity supported Standing balance-Leahy Scale: Poor Standing balance comment: BUE support on RW                            Cognition Arousal/Alertness: Awake/alert Behavior During Therapy: WFL for tasks assessed/performed;Flat affect Overall Cognitive Status: Impaired/Different from baseline Area of Impairment: Safety/judgement;Problem  solving                   Current Attention Level: Sustained   Following Commands: Follows one step commands consistently;Follows one step commands with  increased time Safety/Judgement: Decreased awareness of safety;Decreased awareness of deficits   Problem Solving: Slow processing;Decreased initiation;Difficulty sequencing;Requires verbal cues;Requires tactile cues General Comments: pt with good recall of therapist and past sessions, able to verbalize needs. increased processing time, but able to follow simple commands. benefits from repeated cues for hand positioning/posture      Exercises      General Comments General comments (skin integrity, edema, etc.): VSS on RA (SpO2 90-94% on RA with mobility). continued edema in LLE      Pertinent Vitals/Pain Pain Assessment: Faces Faces Pain Scale: Hurts even more Pain Location: L hip with wt bearing Pain Descriptors / Indicators: Discomfort;Grimacing Pain Intervention(s): Limited activity within patient's tolerance;Monitored during session;Repositioned           PT Goals (current goals can now be found in the care plan section) Acute Rehab PT Goals Patient Stated Goal: to walk in the hall PT Goal Formulation: With patient/family Time For Goal Achievement: 12/20/20 Potential to Achieve Goals: Good Progress towards PT goals: Progressing toward goals    Frequency    Min 3X/week      PT Plan Current plan remains appropriate       AM-PAC PT "6 Clicks" Mobility   Outcome Measure  Help needed turning from your back to your side while in a flat bed without using bedrails?: A Little Help needed moving from lying on your back to sitting on the side of a flat bed without using bedrails?: A Lot Help needed moving to and from a bed to a chair (including a wheelchair)?: A Lot Help needed standing up from a chair using your arms (e.g., wheelchair or bedside chair)?: A Lot Help needed to walk in hospital room?: A Lot Help needed climbing 3-5 steps with a railing? : A Lot 6 Click Score: 13    End of Session Equipment Utilized During Treatment: Gait belt Activity Tolerance: Patient  tolerated treatment well Patient left: in bed;with call bell/phone within reach;with bed alarm set Nurse Communication: Mobility status PT Visit Diagnosis: Unsteadiness on feet (R26.81);Muscle weakness (generalized) (M62.81);Difficulty in walking, not elsewhere classified (R26.2)     Time: 8416-6063 PT Time Calculation (min) (ACUTE ONLY): 27 min  Charges:  $Gait Training: 8-22 mins $Therapeutic Activity: 8-22 mins                     Karma Ganja, PT, DPT   Acute Rehabilitation Department Pager #: [phone]   Otho Bellows 12/11/2020, 2:29 PM

## 2020-12-12 ENCOUNTER — Inpatient Hospital Stay (HOSPITAL_COMMUNITY): Payer: Medicare Other

## 2020-12-12 DIAGNOSIS — G9341 Metabolic encephalopathy: Secondary | ICD-10-CM | POA: Diagnosis not present

## 2020-12-12 DIAGNOSIS — D62 Acute posthemorrhagic anemia: Secondary | ICD-10-CM | POA: Diagnosis not present

## 2020-12-12 DIAGNOSIS — S72012P Unspecified intracapsular fracture of left femur, subsequent encounter for closed fracture with malunion: Secondary | ICD-10-CM | POA: Diagnosis not present

## 2020-12-12 DIAGNOSIS — F419 Anxiety disorder, unspecified: Secondary | ICD-10-CM | POA: Diagnosis not present

## 2020-12-12 LAB — CBC
HCT: 29 % — ABNORMAL LOW (ref 36.0–46.0)
Hemoglobin: 9.4 g/dL — ABNORMAL LOW (ref 12.0–15.0)
MCH: 27.8 pg (ref 26.0–34.0)
MCHC: 32.4 g/dL (ref 30.0–36.0)
MCV: 85.8 fL (ref 80.0–100.0)
Platelets: 281 10*3/uL (ref 150–400)
RBC: 3.38 MIL/uL — ABNORMAL LOW (ref 3.87–5.11)
RDW: 17.1 % — ABNORMAL HIGH (ref 11.5–15.5)
WBC: 10.1 10*3/uL (ref 4.0–10.5)
nRBC: 0 % (ref 0.0–0.2)

## 2020-12-12 LAB — CULTURE, BLOOD (ROUTINE X 2)
Culture: NO GROWTH
Culture: NO GROWTH
Special Requests: ADEQUATE
Special Requests: ADEQUATE

## 2020-12-12 LAB — GLUCOSE, CAPILLARY
Glucose-Capillary: 167 mg/dL — ABNORMAL HIGH (ref 70–99)
Glucose-Capillary: 261 mg/dL — ABNORMAL HIGH (ref 70–99)
Glucose-Capillary: 253 mg/dL — ABNORMAL HIGH (ref 70–99)

## 2020-12-12 LAB — SARS CORONAVIRUS 2 (TAT 6-24 HRS): SARS Coronavirus 2: NEGATIVE

## 2020-12-12 MED ORDER — ACETAMINOPHEN 325 MG PO TABS: 650.0000 mg | ORAL_TABLET | Freq: Four times a day (QID) | ORAL | Status: AC | PRN

## 2020-12-12 MED ORDER — AMIODARONE HCL 200 MG PO TABS: 200.0000 mg | ORAL_TABLET | Freq: Two times a day (BID) | ORAL | Status: DC

## 2020-12-12 MED ORDER — FUROSEMIDE 20 MG PO TABS: 20.0000 mg | ORAL_TABLET | Freq: Every day | ORAL | Status: DC

## 2020-12-12 MED ORDER — APIXABAN 5 MG PO TABS: 5.0000 mg | ORAL_TABLET | Freq: Two times a day (BID) | ORAL | Status: DC

## 2020-12-12 MED ORDER — METOPROLOL SUCCINATE ER 100 MG PO TB24: 100.0000 mg | ORAL_TABLET | Freq: Every day | ORAL | Status: DC

## 2020-12-12 MED ORDER — GLUCERNA SHAKE PO LIQD: 237.0000 mL | Freq: Two times a day (BID) | ORAL | 0 refills | Status: DC

## 2020-12-12 NOTE — TOC Transition Note (Signed)
Transition of Care [facility]) - CM/SW Discharge Note   Patient Details  Name: Alexis [name] MRN: [id number] Date of Birth: February 17, 1951  Transition of [facility] CM/SW Contact:  Vinie Sill, LCSW Phone Number: 12/12/2020, 11:46 AM   Clinical Narrative:     Patient will Discharge to: [facility] Discharge Date: 12/12/2020 Family Notified:Shannon,daughter Transport K[facility]JE[facility]  Per MD patient is ready for discharge. RN, patient, and facility notified of discharge. Discharge Summary sent to facility. RN given number for [phone]. Ambulance transport requested for patient.   Clinical Social Worker signing off.  Thurmond Butts, MSW, LCSW Clinical Social Worker    Final next level of care: Skilled Nursing Facility Barriers to Discharge: Barriers Resolved   Patient Goals and CMS Choice Patient states their goals for this hospitalization and ongoing recovery are:: "I don't want to be in a nursing home for a long time." CMS Medicare.gov Compare Post Acute Care list provided to:: Patient Choice offered to / list presented to : [facility]  Discharge Placement              Patient chooses bed at: [facility] Patient to be transferred to facility by: [facility] Name of family member notified: Shannon,daughter Patient and family notified of of transfer: 12/12/20  Discharge Plan and Services     Post Acute Care Choice: [facility]                               Social Determinants of Health (SDOH) Interventions     Readmission Risk Interventions No flowsheet data found.

## 2020-12-12 NOTE — Progress Notes (Signed)
Physical Therapy Treatment Patient Details Name: Alexis [name] MRN: [id number] DOB: 06/24/1951 Today's Date: 12/12/2020    History of Present Illness Pt is a 70 yo female who fell and sustained a L hip fracture who then underwent a L THA, anterior approach. PMH: depression, DM II, anxiety PSH: ACDF 04/2018    PT Comments    Pt making steady progress towards her physical therapy goals. Requiring min-mod assist for functional mobility. Able to participate in seated warm up exercise for LLE and then ambulate x 15 ft with a walker, utilizing a step to pattern. Continues with LLE weakness, increased edema, pain. Continue to recommend SNF for ongoing Physical Therapy.     Follow Up Recommendations  SNF;Supervision/Assistance - 24 hour     Equipment Recommendations  Rolling walker with 5" wheels    Recommendations for Other Services       Precautions / Restrictions Precautions Precautions: Fall Precaution Comments: watch HR Restrictions Weight Bearing Restrictions: Yes LLE Weight Bearing: Weight bearing as tolerated    Mobility  Bed Mobility               General bed mobility comments: OOB in chair upon arrival    Transfers Overall transfer level: Needs assistance Equipment used: Rolling walker (2 wheeled) Transfers: Sit to/from [facility] Sit to Stand: Mod assist         General transfer comment: Cues for scooting forward to edge, "nose over toes," increased time to power up with cues for hip extension. Pt unable to achieve on first attempt, able to successfully reach upright on second attempt  Ambulation/Gait Ambulation/Gait assistance: Min assist Gait Distance (Feet): 15 Feet Assistive device: Rolling walker (2 wheeled) Gait Pattern/deviations: Step-to pattern;Decreased weight shift to left;Decreased stance time - left;Decreased step length - right;Decreased stride length;Shuffle;Trunk flexed Gait velocity: decreased Gait velocity  interpretation: <1.31 ft/sec, indicative of household ambulator General Gait Details: Pt utilizing step to pattern. Able to clear left foot today without assist. Cues for sequencing, segmental turning, use of arms on walker.   Stairs             Wheelchair Mobility    Modified Rankin (Stroke Patients Only)       Balance Overall balance assessment: Needs assistance Sitting-balance support: No upper extremity supported;Feet supported Sitting balance-Leahy Scale: Fair     Standing balance support: Bilateral upper extremity supported Standing balance-Leahy Scale: Poor Standing balance comment: BUE support on RW                            Cognition Arousal/Alertness: Awake/alert Behavior During Therapy: WFL for tasks assessed/performed;Flat affect Overall Cognitive Status: Impaired/Different from baseline Area of Impairment: Safety/judgement;Problem solving                   Current Attention Level: Sustained   Following Commands: Follows one step commands consistently;Follows one step commands with increased time Safety/Judgement: Decreased awareness of safety;Decreased awareness of deficits   Problem Solving: Slow processing;Decreased initiation;Difficulty sequencing;Requires verbal cues;Requires tactile cues General Comments: pt with good recall of therapist and past sessions, able to verbalize needs. increased processing time, but able to follow simple commands. benefits from repeated cues for hand positioning/posture      Exercises General Exercises - Lower Extremity Quad Sets: Left;15 reps;Seated Long Arc Quad: Left;10 reps;Seated    General Comments        Pertinent Vitals/Pain Pain Assessment: Faces Faces Pain Scale: Hurts even more  Pain Location: L hip with wt bearing Pain Descriptors / Indicators: Discomfort;Grimacing Pain Intervention(s): Limited activity within patient's tolerance;Monitored during session    Home Living                       Prior Function            PT Goals (current goals can now be found in the care plan section) Acute Rehab PT Goals Patient Stated Goal: to walk in the hall PT Goal Formulation: With patient/family Time For Goal Achievement: 12/20/20 Potential to Achieve Goals: Good Progress towards PT goals: Progressing toward goals    Frequency    Min 3X/week      PT Plan Current plan remains appropriate    Co-evaluation              AM-PAC PT "6 Clicks" Mobility   Outcome Measure  Help needed turning from your back to your side while in a flat bed without using bedrails?: A Little Help needed moving from lying on your back to sitting on the side of a flat bed without using bedrails?: A Lot Help needed moving to and from a bed to a chair (including a wheelchair)?: A Little Help needed standing up from a chair using your arms (e.g., wheelchair or bedside chair)?: A Little Help needed to walk in hospital room?: A Little Help needed climbing 3-5 steps with a railing? : A Lot 6 Click Score: 16    End of Session Equipment Utilized During Treatment: Gait belt Activity Tolerance: Patient tolerated treatment well Patient left: in bed;with call bell/phone within reach;with bed alarm set Nurse Communication: Mobility status PT Visit Diagnosis: Unsteadiness on feet (R26.81);Muscle weakness (generalized) (M62.81);Difficulty in walking, not elsewhere classified (R26.2)     Time: 5038-8828 PT Time Calculation (min) (ACUTE ONLY): 29 min  Charges:  $Gait Training: 8-22 mins $Therapeutic Activity: 8-22 mins                     Wyona Almas, PT, DPT Acute Rehabilitation Services Pager [phone] Office [phone]    Deno Etienne 12/12/2020, 1:40 PM

## 2020-12-12 NOTE — Progress Notes (Signed)
RN called report to [facility] RN at [facility]. IV removed. PTAR ar bedside to transport. D/C education placed in packet fro receiving facility. Pt husband and daughter updated about transport.

## 2020-12-12 NOTE — Discharge Summary (Signed)
Alexis [name] YM:[id number] DOB: 1950-09-30 DOA: 12/04/2020  PCP: Deland Pretty, MD  Admit date: 12/04/2020 Discharge date: 12/12/2020  Admitted From: home Disposition:  SNF  Recommendations for Outpatient Follow-up:  1. Follow up with PCP in 1 week 2. Please obtain BMP/CBC in one week 3. Follow up with cardiology Dr. Debara Pickett in 10 days 4. Follow up with Orthopedic Dr. Lyla Glassing in one week.    Discharge Condition:Stable CODE STATUS:FUll  Diet recommendation: carb modified, low sodium diet   Brief/Interim Summary: Per HPI: Alexis [name] is a 70 y.o. female with medical history significant of hypertension, hyperlipidemia, CAD, diabetes mellitus type 2 with peripheral neuropathy, uterine cancer s/p hysterectomy, and OSA presents after having a fall at home.  She had gotten up to use restroom morning around 3-4 am and tripped over a heating blanket cord.  She did not hit her head or lose consciousness at the time.  Her husband immediately woke up and tried to help her get up.  She reported having intense pain in the left hip for which she was unable to bear weight.  Associated symptoms of pain in her right foot as well.   X-rays revealed a subcapital fracture of the left femur.  Orthopedics was consulted.  Patient was admitted to the hospital.She underwent Left total hip  arthroplasty on 4/26. 2 days post op patient went into atrial fibrillation with RVR. Cardiology was consulted. During the management she went into NSR , but then went into afib rvr, was started on iv amiodarone . Since then has remained in NSR.She did receive diuretics prn for being volume overloaded .She aslso had post op blood loss s/p 2 units transfusion PRBC. Patient today was walking with PT and accidentally the telemetry monitor hit her head. Patient reported it did hurt. Patient was neurologically ok. CT head was obtained and was negative. Covid negative    Left subcapital femur fracture: POD2  Secondary to  mechanical fall  Status post left total hip arthroplasty on 4/26 PT recommends SNF WBAT with walker  Need to follow-up with orthopedics in one week On Eliquis for DVT ppx.    Leukocytosi/low grade fever Afebrile  Leukocytosis resolved  Blood cultures negative, thus, antibiotics will discontinue previously      PAF/RVR- Was back in afib on 4/29. Now in sinus rhythm Cardiology following Likely multifactorial has OSA with profound anemia post op when first occurs happened. Was started on beta blk and eliquis Echo nml EF, no wall motion abnormalities plavix home dose was discontinued since started on Eliquis Anemia corrected When she went back to afib, was  started on iv amiodarone with  transition to p.o. likely tomorrow Is mildly volume overloaded, on 2L [place], likely from blood, ivf, iv meds. BNP elevated. Will give low dose lasix 20mg  iv x1. -on amiodarone 200 mg twice daily Continue Lasix 20 mg daily Eliquis 5 mg twice daily  Toprol-XL 100 mg daily Needs BMP checked in 1 week Follow Dr. Debara Pickett in 10 day     Post op blood loss-  Hemoglobin was 6.4 ,s/p 2units PRBC 5/1-stool occult postive Spoke to Dr. Henrene Pastor GI via chat about stool occult +,but since pt without any obvious bloody stool, PRBPR, and Hg stable, he thought blood loss from surgery. Her recommended just following pt and if any obvious bleeding then reconsult him. Hg remains stable   Urinary retention-post op +BM Continue flomax Foley was d/c;'d and voiding fine  Hypokalemia-mild.  Replaced and stable. Check bmp as outpt  AKI:  Renal function worsening. NO void. Possibly multifactorial as bp low, urinary retention, and BG elevated (prerenal) renal function improved after IV fluids and Foley placed, and remained stable.    Diabetes type 2: A1c of10.3 Continue home regimen Check FS     Coronary artery disease:Recently had coronary CTA which showed diffuse  nonobstructive coronary artery disease. On statin and Plavix at home. plavix d/cd by cardiology since started on Eliquis. Unable to take aspirin due to GI issues   Hypertension:  Stable.   Hyperlipidemia:continue statin  Anxiety/depression: continue home regimen  OSA: needs to f/u with pcp for cpap  GERD: Protonix    Discharge Diagnoses:  Principal Problem:   Subcapital fracture of left femur (HCC) Active Problems:   Hyperlipidemia   Anxiety and depression   OSA (obstructive sleep apnea)   Coronary artery calcification seen on computed tomography   Type 2 diabetes mellitus ([place])   Leukocytosis   Fall at home, initial encounter   Hypotension   Acute metabolic encephalopathy   Paroxysmal atrial fibrillation (HCC)   Acute blood loss anemia    Discharge Instructions   Allergies as of 12/12/2020      Reactions   Aspirin Other (See Comments)   Reaction: Vasculitis per MD   Codeine Itching, Rash   Naproxen Nausea Only   Reaction: Makes stomach upset/irritated.   Sulfonamide Derivatives Itching, Rash      Medication List    STOP taking these medications   amLODipine 5 MG tablet Commonly known as: NORVASC   clopidogrel 75 MG tablet Commonly known as: PLAVIX   traMADol 50 MG tablet Commonly known as: ULTRAM   valsartan 320 MG tablet Commonly known as: DIOVAN   zaleplon 5 MG capsule Commonly known as: SONATA     TAKE these medications   acetaminophen 325 MG tablet Commonly known as: TYLENOL Take 2 tablets (650 mg total) by mouth every 6 (six) hours as needed for mild pain or moderate pain. What changed:   medication strength  how much to take  reasons to take this   ALPRAZolam 0.5 MG tablet Commonly known as: XANAX Take 0.5-1 mg by mouth at bedtime as needed for anxiety.   amiodarone 200 MG tablet Commonly known as: PACERONE Take 1 tablet (200 mg total) by mouth 2 (two) times daily.   apixaban 5 MG Tabs tablet Commonly known as:  ELIQUIS Take 1 tablet (5 mg total) by mouth 2 (two) times daily.   atorvastatin 20 MG tablet Commonly known as: LIPITOR Take 20 mg by mouth at bedtime.   b complex vitamins tablet Take 1 tablet by mouth at bedtime.   cyanocobalamin 1000 MCG tablet Take 1,000 mcg by mouth at bedtime.   Dulaglutide 3 MG/0.5ML Sopn Inject 3 mg into the skin every Sunday.   Farxiga 10 MG Tabs tablet Generic drug: dapagliflozin propanediol Take 10 mg by mouth daily.   feeding supplement (GLUCERNA SHAKE) Liqd Take 237 mLs by mouth 2 (two) times daily between meals.   fish oil-omega-3 fatty acids 1000 MG capsule Take 1 g by mouth at bedtime.   furosemide 20 MG tablet Commonly known as: LASIX Take 1 tablet (20 mg total) by mouth daily. Start taking on: Dec 13, 2020 What changed:   when to take this  reasons to take this   HYDROcodone-acetaminophen 5-325 MG tablet Commonly known as: NORCO/VICODIN Take 1 tablet by mouth every 4 (four) hours as needed for up to 7 days for moderate pain.   metFORMIN  500 MG 24 hr tablet Commonly known as: GLUCOPHAGE-XR Take 500 mg by mouth daily.   metoprolol succinate 100 MG 24 hr tablet Commonly known as: TOPROL-XL Take 1 tablet (100 mg total) by mouth daily. Take with or immediately following a meal. Start taking on: Dec 13, 2020 What changed:   medication strength  when to take this   pregabalin 50 MG capsule Commonly known as: LYRICA Take 50 mg by mouth 2 (two) times daily.   RABEprazole 20 MG tablet Commonly known as: ACIPHEX Take 1 tablet (20 mg total) by mouth 2 (two) times daily.   rizatriptan 10 MG tablet Commonly known as: MAXALT Take 10 mg by mouth as needed for migraine. May repeat in 2 hours if needed   tamsulosin 0.4 MG Caps capsule Commonly known as: FLOMAX Take 0.4 mg by mouth at bedtime.   Tyler Aas FlexTouch 100 UNIT/ML FlexTouch Pen Generic drug: insulin degludec Inject 25 Units into the skin daily.   venlafaxine XR 150 MG  24 hr capsule Commonly known as: EFFEXOR-XR Take 150 mg by mouth 2 (two) times daily.       Follow-up Information    Swinte[place], Aaron Edelman, MD. Schedule an appointment as soon as possible for a visit in 2 weeks.   Specialty: Orthopedic Surgery Why: For wound re-check, For suture removal Contact information: [address] [place] [address] [phone]        Almyra Deforest, [place] Follow up on 12/27/2020.   Specialties: Cardiology, Radiology Why: at 9:45am. Please arrive by 9:30am  Contact information: [address] [place] [address] [phone]        Pixie Casino, MD Follow up in 1 week(s).   Specialty: Cardiology Contact information: [place] [address] [phone]              Allergies  Allergen Reactions  . Aspirin Other (See Comments)    Reaction: Vasculitis per MD  . Codeine Itching and Rash  . Naproxen Nausea Only    Reaction: Makes stomach upset/irritated.  . Sulfonamide Derivatives Itching and Rash    Consultations:  Cardiology, orthopedics   Procedures/Studies: DG Chest 1 View  Result Date: 12/04/2020 CLINICAL DATA:  Fall EXAM: CHEST  1 VIEW COMPARISON:  05/25/2014 FINDINGS: Prominent heart size without heart failure. Lungs well aerated and clear. No infiltrate or effusion. Atherosclerotic aorta. ACDF cervical spine IMPRESSION: No active disease. Electronically Signed   By: Franchot Gallo M.D.   On: 12/04/2020 07:58   DG Lumbar Spine Complete  Result Date: 12/04/2020 CLINICAL DATA:  Fall EXAM: LUMBAR SPINE - COMPLETE 4+ VIEW COMPARISON:  None. FINDINGS: Negative for lumbar fracture Normal alignment. Mild scoliosis. Mild to moderate disc degeneration at L3-4. Atherosclerotic aorta. IMPRESSION: Negative for fracture. Electronically Signed   By: Franchot Gallo M.D.   On: 12/04/2020 07:56   DG Pelvis 1-2 Views  Result Date: 12/04/2020 CLINICAL DATA:  Fall EXAM: PELVIS - 1-2 VIEW  COMPARISON:  None. FINDINGS: Subcapital fracture left femur with impaction. No significant displacement. Left hip joint normal. No other pelvic fracture identified. IMPRESSION: Subcapital fracture left femur Electronically Signed   By: Franchot Gallo M.D.   On: 12/04/2020 07:55   DG Tibia/Fibula Left  Result Date: 12/04/2020 CLINICAL DATA:  Fall EXAM: LEFT TIBIA AND FIBULA - 2 VIEW COMPARISON:  None. FINDINGS: There is no evidence of fracture or other focal bone lesions. Soft tissues are unremarkable. IMPRESSION: Negative. Electronically Signed   By: Juanda Crumble  Chestine Spore M.D.   On: 12/04/2020 07:55   CT HEAD WO CONTRAST  Result Date: 12/12/2020 CLINICAL DATA:  Head injury rule out intracranial hemorrhage. On Eliquis. EXAM: CT HEAD WITHOUT CONTRAST TECHNIQUE: Contiguous axial images were obtained from the base of the skull through the vertex without intravenous contrast. COMPARISON:  CT head 12/06/2020 FINDINGS: Brain: Ventricle size and cerebral volume normal for age. Negative for acute infarct, hemorrhage, mass. Vascular: Negative for hyperdense vessel Skull: Negative Sinuses/Orbits: Paranasal sinuses clear.  Negative orbit Other: None IMPRESSION: No acute abnormality no change from the recent CT. Electronically Signed   By: Marlan Palau M.D.   On: 12/12/2020 14:20   CT HEAD WO CONTRAST  Result Date: 12/06/2020 CLINICAL DATA:  Change in mental status. EXAM: CT HEAD WITHOUT CONTRAST TECHNIQUE: Contiguous axial images were obtained from the base of the skull through the vertex without intravenous contrast. COMPARISON:  None. FINDINGS: Brain: Normal for age atrophy. No intracranial hemorrhage, mass effect, or midline shift. No hydrocephalus. The basilar cisterns are patent. No evidence of territorial infarct or acute ischemia. No extra-axial or intracranial fluid collection. Vascular: Atherosclerosis of skullbase vasculature without hyperdense vessel or abnormal calcification. Skull: No fracture or focal  lesion. Sinuses/Orbits: Paranasal sinuses and mastoid air cells are clear. The visualized orbits are unremarkable. Other: None. IMPRESSION: Unremarkable noncontrast head CT for age. Electronically Signed   By: Narda Rutherford M.D.   On: 12/06/2020 23:28   Pelvis Portable  Result Date: 12/05/2020 CLINICAL DATA:  Postop. EXAM: PORTABLE PELVIS 1-2 VIEWS COMPARISON:  Preoperative radiograph yesterday. FINDINGS: Left hip arthroplasty in expected alignment. No periprosthetic lucency or fracture. Recent postsurgical change includes air and edema in the soft tissues. Lateral skin staples. IMPRESSION: Left hip arthroplasty without immediate postoperative complication. Electronically Signed   By: Narda Rutherford M.D.   On: 12/05/2020 16:09   CT CORONARY MORPH W/CTA COR W/SCORE W/CA W/CM &/OR WO/CM  Addendum Date: 11/21/2020   ADDENDUM REPORT: 11/21/2020 18:18 HISTORY: Chest pain, nonspecific EXAM: Cardiac/Coronary CT TECHNIQUE: The patient was scanned on a [employer]. PROTOCOL: A 130 kV prospective scan was triggered in the descending thoracic aorta at 111 HU's. Axial non-contrast 3 mm slices were carried out through the heart. The data set was analyzed on a dedicated work station and scored using the Agatson method. Gantry rotation speed was 250 msecs and collimation was 0.6 mm. Heart rate optimized medically, and 0.8 mg of sublingual nitroglycerin was given. The 3D data set was reconstructed in 5% intervals of 35-75% of the R-R cycle. Diastolic phases were analyzed on a dedicated work station using MPR, MIP and VRT modes. The patient received 2mL OMNIPAQUE IOHEXOL 350 MG/ML SOLN of contrast. FINDINGS: Coronary calcium score: The patient's coronary artery calcium score is 469, which places the patient in the 93rd percentile. Coronary arteries: Normal coronary origins.  Right dominance. Right Coronary Artery: Normal caliber vessel, gives rise to PDA. There is diffuse plaque, predominantly noncalcified  but also with intermittent mixed calcified and noncalcified plaque. Most areas of stenosis are minimal, but there is a focal area of mixed calcified and noncalcified plaque at the transition from the proximal to mid RCA with approximately 25% stenosis. Left Main Coronary Artery: Normal caliber vessel. No significant plaque or stenosis. Left Anterior Descending Coronary Artery: Normal caliber vessel. Scattered predominantly noncalcified as well as mixed calcified and noncalcified plaque. Maximum 25-49% stenosis in mid LAD. Gives rise to 1 prominent diagonal branch. Left Circumflex Artery: Normal caliber vessel. Scattered predominantly noncalcified plaque  with trivial mixed plaque. Minimal stenosis of 1-24%. Gives rise to 1 OM branch. Aorta: Normal size, 37 mm at the mid ascending aorta (level of the PA bifurcation) measured double oblique. Scattered calcifications consistent with aortic atherosclerosis. No dissection. Aortic Valve: No calcifications. Trileaflet. Other findings: Normal pulmonary vein drainage into the left atrium. Normal left atrial appendage without a thrombus. Normal size of the pulmonary artery. Mitral annular calcification Small patent foramen ovale without evidence of shunt. Suboptimal arterial contrast opacification due to body habitus. IMPRESSION: 1.  Diffuse nonobstructive CAD, CADRADS = 2. 2. Coronary calcium score of 469. This was 93rd percentile for age and sex matched control. 3. Normal coronary origin with right dominance. 4.  Aortic atherosclerosis 5.  Small PFO without evidence of shunting. 6.  Mitral annular calcification. INTERPRETATION: 1. CAD-RADS 0: No evidence of CAD (0%). Consider non-atherosclerotic causes of chest pain. 2. CAD-RADS 1: Minimal non-obstructive CAD (0-24%). Consider non-atherosclerotic causes of chest pain. Consider preventive therapy and risk factor modification. 3. CAD-RADS 2: Mild non-obstructive CAD (25-49%). Consider non-atherosclerotic causes of chest pain.  Consider preventive therapy and risk factor modification. 4. CAD-RADS 3: Moderate stenosis (50-69%). Consider symptom-guided anti-ischemic pharmacotherapy as well as risk factor modification per guideline directed care. Additional analysis with CT FFR will be submitted. 5. CAD-RADS 4: Severe stenosis. (70-99% or > 50% left main). Cardiac catheterization or CT FFR is recommended. Consider symptom-guided anti-ischemic pharmacotherapy as well as risk factor modification per guideline directed care. Invasive coronary angiography recommended with revascularization per published guideline statements. 6. CAD-RADS 5: Total coronary occlusion (100%). Consider cardiac catheterization or viability assessment. Consider symptom-guided anti-ischemic pharmacotherapy as well as risk factor modification per guideline directed care. 7. CAD-RADS N: Non-diagnostic study. Obstructive CAD can't be excluded. Alternative evaluation is recommended. Electronically Signed   By: Buford Dresser M.D.   On: 11/21/2020 18:18   Result Date: 11/21/2020 EXAM: OVER-READ INTERPRETATION  CT CHEST The following report is an over-read performed by radiologist Dr. Rolm Baptise of [facility], [place] on 11/21/2020. This over-read does not include interpretation of cardiac or coronary anatomy or pathology. The coronary CTA interpretation by the cardiologist is attached. COMPARISON:  09/06/2016 FINDINGS: Vascular: Heart is normal size.  Aorta normal caliber. Mediastinum/Nodes: No adenopathy Lungs/Pleura: Visualized lungs clear.  No effusions. Upper Abdomen: Imaging into the upper abdomen demonstrates no acute findings. Musculoskeletal: Chest wall soft tissues are unremarkable. No acute bony abnormality. IMPRESSION: No acute or significant extracardiac abnormality. Electronically Signed: By: Rolm Baptise M.D. On: 11/21/2020 12:26   DG CHEST PORT 1 VIEW  Result Date: 12/09/2020 CLINICAL DATA:  Shortness of breath. EXAM: PORTABLE CHEST 1 VIEW  COMPARISON:  12/06/2020 FINDINGS: 1119 hours. The cardio pericardial silhouette is enlarged. There is pulmonary vascular congestion without overt pulmonary edema. Interstitial markings are diffusely coarsened with chronic features. Retrocardiac atelectasis or infiltrate noted. IMPRESSION: Retrocardiac left base atelectasis or infiltrate. Cardiomegaly with vascular congestion. Electronically Signed   By: Misty Stanley M.D.   On: 12/09/2020 13:11   DG CHEST PORT 1 VIEW  Result Date: 12/06/2020 CLINICAL DATA:  Altered mental status EXAM: PORTABLE CHEST 1 VIEW COMPARISON:  December 04, 2020 FINDINGS: Cardiac enlargement with central vascular prominence. Aortic atherosclerosis. Low lung volumes with bibasilar atelectasis. No overt pulmonary edema. Partially visualized anterior spinal fusion hardware. IMPRESSION: Cardiac enlargement with central vascular prominence. Electronically Signed   By: Dahlia Bailiff MD   On: 12/06/2020 21:48   DG Knee Complete 4 Views Right  Result Date: 12/04/2020 CLINICAL DATA:  Fall EXAM: RIGHT KNEE - COMPLETE 4+ VIEW COMPARISON:  None. FINDINGS: No evidence of fracture, dislocation, or joint effusion. No evidence of arthropathy or other focal bone abnormality. Soft tissues are unremarkable. IMPRESSION: Negative. Electronically Signed   By: Franchot Gallo M.D.   On: 12/04/2020 07:57   DG Foot Complete Right  Result Date: 12/04/2020 CLINICAL DATA:  Fall EXAM: RIGHT FOOT COMPLETE - 3+ VIEW COMPARISON:  None. FINDINGS: Negative for foot fracture Hallux valgus deformity.  Otherwise no arthropathy. IMPRESSION: Negative for fracture. Electronically Signed   By: Franchot Gallo M.D.   On: 12/04/2020 07:57   DG C-Arm 1-60 Min  Result Date: 12/05/2020 CLINICAL DATA:  Elective surgery. Additional history provided: Total left hip arthroplasty with anterior approach. Provided fluoroscopy time: 12 seconds (2.2132 mGy). EXAM: OPERATIVE left HIP (WITH PELVIS IF PERFORMED) 2 VIEWS TECHNIQUE:  Fluoroscopic spot image(s) were submitted for interpretation post-operatively. COMPARISON:  Radiographs of the left femur and pelvis 12/04/2020. FINDINGS: Two intraoperative fluoroscopic images of the left hip are submitted. On the provided images, there is evidence of interval left total hip arthroplasty. The femoral and acetabular components appear well seated. No unexpected finding on the provided views. IMPRESSION: Two intraoperative fluoroscopic images from left total hip arthroplasty, as described. Electronically Signed   By: Kellie Simmering DO   On: 12/05/2020 14:55   ECHOCARDIOGRAM COMPLETE  Result Date: 12/07/2020    ECHOCARDIOGRAM REPORT   Patient Name:   Alexis [name] Date of Exam: 12/07/2020 Medical Rec #:  [id number]         Height:       65.0 in Accession #:    [id number]        Weight:       184.5 lb Date of Birth:  1951-01-03         BSA:          1.912 m Patient Age:    70 years          BP:           121/62 mmHg Patient Gender: F                 HR:           83 bpm. Exam Location:  Inpatient Procedure: 2D Echo Indications:    atrial fibrillation  History:        Patient has prior history of Echocardiogram examinations, most                 recent 06/23/2007. Risk Factors:Sleep Apnea, Diabetes and                 Dyslipidemia.  Sonographer:    Johny Chess Referring Phys: [id number] DAVID MANUEL ORTIZ  Sonographer Comments: Pain from probe pressure and Image acquisition challenging due to patient body habitus. IMPRESSIONS  1. Left ventricular ejection fraction, by estimation, is 60 to 65%. The left ventricle has normal function. The left ventricle has no regional wall motion abnormalities. Left ventricular diastolic parameters were normal.  2. Right ventricular systolic function is normal. The right ventricular size is mildly enlarged. There is mildly elevated pulmonary artery systolic pressure. The estimated right ventricular systolic pressure is 95.2 mmHg.  3. The mitral valve is  degenerative. No evidence of mitral valve regurgitation. No evidence of mitral stenosis. Moderate mitral annular calcification.  4. The aortic valve is tricuspid. There is mild calcification of the aortic valve. There is mild thickening of the aortic valve. Aortic valve regurgitation  is not visualized. No aortic stenosis is present.  5. The inferior vena cava is normal in size with greater than 50% respiratory variability, suggesting right atrial pressure of 3 mmHg. FINDINGS  Left Ventricle: Left ventricular ejection fraction, by estimation, is 60 to 65%. The left ventricle has normal function. The left ventricle has no regional wall motion abnormalities. The left ventricular internal cavity size was normal in size. There is  no left ventricular hypertrophy. Left ventricular diastolic parameters were normal. Right Ventricle: The right ventricular size is mildly enlarged. No increase in right ventricular wall thickness. Right ventricular systolic function is normal. There is mildly elevated pulmonary artery systolic pressure. The tricuspid regurgitant velocity is 3.16 m/s, and with an assumed right atrial pressure of 3 mmHg, the estimated right ventricular systolic pressure is 0000000 mmHg. Left Atrium: Left atrial size was normal in size. Right Atrium: Right atrial size was normal in size. Pericardium: Trivial pericardial effusion is present. Presence of pericardial fat pad. Mitral Valve: The mitral valve is degenerative in appearance. Moderate mitral annular calcification. No evidence of mitral valve regurgitation. No evidence of mitral valve stenosis. Tricuspid Valve: The tricuspid valve is grossly normal. Tricuspid valve regurgitation is mild . No evidence of tricuspid stenosis. Aortic Valve: The aortic valve is tricuspid. There is mild calcification of the aortic valve. There is mild thickening of the aortic valve. Aortic valve regurgitation is not visualized. No aortic stenosis is present. Pulmonic Valve: The  pulmonic valve was grossly normal. Pulmonic valve regurgitation is not visualized. No evidence of pulmonic stenosis. Aorta: The aortic root and ascending aorta are structurally normal, with no evidence of dilitation. Venous: The inferior vena cava is normal in size with greater than 50% respiratory variability, suggesting right atrial pressure of 3 mmHg. IAS/Shunts: The atrial septum is grossly normal.  LEFT VENTRICLE PLAX 2D LVIDd:         4.40 cm  Diastology LVIDs:         2.80 cm  LV e' medial:    9.46 cm/s LV PW:         0.90 cm  LV E/e' medial:  12.3 LV IVS:        1.00 cm  LV e' lateral:   7.62 cm/s LVOT diam:     1.80 cm  LV E/e' lateral: 15.2 LV SV:         71 LV SV Index:   37 LVOT Area:     2.54 cm  RIGHT VENTRICLE             IVC RV S prime:     16.00 cm/s  IVC diam: 2.20 cm TAPSE (M-mode): 2.4 cm LEFT ATRIUM             Index       RIGHT ATRIUM           Index LA diam:        4.00 cm 2.09 cm/m  RA Area:     14.30 cm LA Vol (A2C):   52.4 ml 27.41 ml/m RA Volume:   36.00 ml  18.83 ml/m LA Vol (A4C):   57.1 ml 29.87 ml/m LA Biplane Vol: 59.7 ml 31.23 ml/m  AORTIC VALVE LVOT Vmax:   147.00 cm/s LVOT Vmean:  100.000 cm/s LVOT VTI:    0.278 m  AORTA Ao Root diam: 3.10 cm Ao Asc diam:  3.70 cm MITRAL VALVE                TRICUSPID VALVE  MV Area (PHT): 3.99 cm     TR Peak grad:   39.9 mmHg MV Decel Time: 190 msec     TR Vmax:        316.00 cm/s MV E velocity: 116.00 cm/s MV A velocity: 119.00 cm/s  SHUNTS MV E/A ratio:  0.97         Systemic VTI:  0.28 m                             Systemic Diam: 1.80 cm Eleonore Chiquito MD Electronically signed by Eleonore Chiquito MD Signature Date/Time: 12/07/2020/2:18:31 PM    Final    DG HIP OPERATIVE UNILAT W OR W/O PELVIS LEFT  Result Date: 12/05/2020 CLINICAL DATA:  Elective surgery. Additional history provided: Total left hip arthroplasty with anterior approach. Provided fluoroscopy time: 12 seconds (2.2132 mGy). EXAM: OPERATIVE left HIP (WITH PELVIS IF PERFORMED) 2  VIEWS TECHNIQUE: Fluoroscopic spot image(s) were submitted for interpretation post-operatively. COMPARISON:  Radiographs of the left femur and pelvis 12/04/2020. FINDINGS: Two intraoperative fluoroscopic images of the left hip are submitted. On the provided images, there is evidence of interval left total hip arthroplasty. The femoral and acetabular components appear well seated. No unexpected finding on the provided views. IMPRESSION: Two intraoperative fluoroscopic images from left total hip arthroplasty, as described. Electronically Signed   By: Kellie Simmering DO   On: 12/05/2020 14:55   DG Femur Min 2 Views Left  Result Date: 12/04/2020 CLINICAL DATA:  Fall EXAM: LEFT FEMUR 2 VIEWS COMPARISON:  None. FINDINGS: Subcapital fracture left femur with mild impaction. Left hip joint normal. No other femur fracture. IMPRESSION: Subcapital fracture left femur Electronically Signed   By: Franchot Gallo M.D.   On: 12/04/2020 07:54   VAS [place] UPPER EXTREMITY VENOUS DUPLEX  Result Date: 12/09/2020 UPPER VENOUS STUDY  Patient Name:  Alexis [name]  Date of Exam:   12/08/2020 Medical Rec #: [id number]          Accession #:    [id number] Date of Birth: 12/03/1950          Patient Gender: F Patient Age:   70Y Exam Location:  [facility] Procedure:      VAS [place] UPPER EXTREMITY VENOUS DUPLEX Referring Phys: [id number] [facility] [place] --------------------------------------------------------------------------------  Indications: Right hand swelling Comparison Study: No prior study Performing Technologist: Maudry Mayhew MHA, RDMS, RVT, RDCS  Examination Guidelines: A complete evaluation includes B-mode imaging, spectral Doppler, color Doppler, and power Doppler as needed of all accessible portions of each vessel. Bilateral testing is considered an integral part of a complete examination. Limited examinations for reoccurring indications may be performed as noted.  Right Findings:  +----------+------------+---------+-----------+----------+-------------+ RIGHT     CompressiblePhasicitySpontaneousProperties   Summary    +----------+------------+---------+-----------+----------+-------------+ IJV           Full       Yes       Yes                            +----------+------------+---------+-----------+----------+-------------+ Subclavian    Full       Yes       Yes                            +----------+------------+---------+-----------+----------+-------------+ Axillary      Full       Yes  Yes              Rouleaux flow +----------+------------+---------+-----------+----------+-------------+ Brachial      Full       Yes       Yes              Rouleaux flow +----------+------------+---------+-----------+----------+-------------+ Radial        Full                                                +----------+------------+---------+-----------+----------+-------------+ Ulnar         Full                                                +----------+------------+---------+-----------+----------+-------------+ Cephalic      Full                                                +----------+------------+---------+-----------+----------+-------------+ Basilic       Full                                                +----------+------------+---------+-----------+----------+-------------+  Left Findings: +----------+------------+---------+-----------+----------+-------+ LEFT      CompressiblePhasicitySpontaneousPropertiesSummary +----------+------------+---------+-----------+----------+-------+ Subclavian               Yes       Yes                      +----------+------------+---------+-----------+----------+-------+  Summary:  Right: No evidence of deep vein thrombosis in the upper extremity. No evidence of superficial vein thrombosis in the upper extremity.  Left: No evidence of thrombosis in the subclavian.  *See table(s)  above for measurements and observations.  Diagnosing physician: Harold Barban MD Electronically signed by Harold Barban MD on 12/09/2020 at 4:15:19 PM.    Final       Subjective: Now feels hip pain b/c ambulated "too much" yesterday . No sob , cp, palpitations   Discharge Exam: Vitals:   12/12/20 0815 12/12/20 1150  BP: 132/65 (!) 140/58  Pulse: 75 76  Resp: 20   Temp: 98.2 F (36.8 C) (!) 97.1 F (36.2 C)  SpO2: 93% 96%   Vitals:   12/11/20 2320 12/12/20 0345 12/12/20 0815 12/12/20 1150  BP: 132/69 (!) 142/64 132/65 (!) 140/58  Pulse: 72  75 76  Resp: 20 20 20    Temp: 98.5 F (36.9 C) 98.1 F (36.7 C) 98.2 F (36.8 C) (!) 97.1 F (36.2 C)  TempSrc: Axillary Oral  Oral  SpO2: 97%  93% 96%  Weight:        General: Pt is alert, awake, not in acute distress Cardiovascular: RRR, S1/S2 +, no rubs, no gallops Respiratory: CTA bilaterally, no wheezing, no rhonchi Abdominal: Soft, NT, ND, bowel sounds + Extremities:+edema b/l LE, improving    The results of significant diagnostics from this hospitalization (including imaging, microbiology, ancillary and laboratory) are listed below for reference.     Microbiology: Recent Results (from the  past 240 hour(s))  Resp Panel by RT-PCR (Flu A&B, Covid) Nasopharyngeal Swab     Status: None   Collection Time: 12/04/20  8:13 AM   Specimen: Nasopharyngeal Swab; Nasopharyngeal(NP) swabs in vial transport medium  Result Value Ref Range Status   SARS Coronavirus 2 by RT PCR NEGATIVE NEGATIVE Final    Comment: (NOTE) SARS-CoV-2 target nucleic acids are NOT DETECTED.  The SARS-CoV-2 RNA is generally detectable in upper respiratory specimens during the acute phase of infection. The lowest concentration of SARS-CoV-2 viral copies this assay can detect is 138 copies/mL. A negative result does not preclude SARS-Cov-2 infection and should not be used as the sole basis for treatment or other patient management decisions. A negative  result may occur with  improper specimen collection/handling, submission of specimen other than nasopharyngeal swab, presence of viral mutation(s) within the areas targeted by this assay, and inadequate number of viral copies(<138 copies/mL). A negative result must be combined with clinical observations, patient history, and epidemiological information. The expected result is Negative.  Fact Sheet for Patients:  EntrepreneurPulse.com.au  Fact Sheet for Healthcare Providers:  IncredibleEmployment.be  This test is no t yet approved or cleared by the [place] FDA and  has been authorized for detection and/or diagnosis of SARS-CoV-2 by FDA under an Emergency Use Authorization (EUA). This EUA will remain  in effect (meaning this test can be used) for the duration of the COVID-19 declaration under Section 564(b)(1) of the Act, 21 U.S.C.section 360bbb-3(b)(1), unless the authorization is terminated  or revoked sooner.       Influenza A by PCR NEGATIVE NEGATIVE Final   Influenza B by PCR NEGATIVE NEGATIVE Final    Comment: (NOTE) The Xpert Xpress SARS-CoV-2/FLU/RSV plus assay is intended as an aid in the diagnosis of influenza from Nasopharyngeal swab specimens and should not be used as a sole basis for treatment. Nasal washings and aspirates are unacceptable for Xpert Xpress SARS-CoV-2/FLU/RSV testing.  Fact Sheet for Patients: EntrepreneurPulse.com.au  Fact Sheet for Healthcare Providers: IncredibleEmployment.be  This test is not yet approved or cleared by the [place] FDA and has been authorized for detection and/or diagnosis of SARS-CoV-2 by FDA under an Emergency Use Authorization (EUA). This EUA will remain in effect (meaning this test can be used) for the duration of the COVID-19 declaration under Section 564(b)(1) of the Act, 21 U.S.C. section 360bbb-3(b)(1), unless the authorization is  terminated or revoked.  Performed at [facility], [place] [address] [place], [place] [address]   Culture, Urine     Status: None   Collection Time: 12/07/20 11:59 AM   Specimen: Urine, Random  Result Value Ref Range Status   Specimen Description URINE, RANDOM  Final   Special Requests NONE  Final   Culture   Final    NO GROWTH Performed at [facility], [place] [address] [place], [place] [address]    Report Status 12/08/2020 FINAL  Final  Culture, blood (routine x 2)     Status: None   Collection Time: 12/07/20  1:17 PM   Specimen: BLOOD  Result Value Ref Range Status   Specimen Description BLOOD RIGHT ANTECUBITAL  Final   Special Requests   Final    BOTTLES DRAWN AEROBIC AND ANAEROBIC Blood Culture adequate volume   Culture   Final    NO GROWTH 5 DAYS Performed at [facility], [place] [address], [place], [place] [address]    Report Status 12/12/2020 FINAL  Final  Culture,  blood (routine x 2)     Status: None   Collection Time: 12/07/20  1:17 PM   Specimen: BLOOD  Result Value Ref Range Status   Specimen Description BLOOD BLOOD LEFT HAND  Final   Special Requests   Final    BOTTLES DRAWN AEROBIC AND ANAEROBIC Blood Culture adequate volume   Culture   Final    NO GROWTH 5 DAYS Performed at [facility], [address], [place], [place] [address]    Report Status 12/12/2020 FINAL  Final  SARS CORONAVIRUS 2 (TAT 6-24 HRS) Nasopharyngeal Nasopharyngeal Swab     Status: None   Collection Time: 12/11/20  4:04 PM   Specimen: Nasopharyngeal Swab  Result Value Ref Range Status   SARS Coronavirus 2 NEGATIVE NEGATIVE Final    Comment: (NOTE) SARS-CoV-2 target nucleic acids are NOT DETECTED.  The SARS-CoV-2 RNA is generally detectable in upper and lower respiratory specimens during the acute phase of infection. Negative results do not preclude SARS-CoV-2 infection, do not rule out co-infections with other pathogens, and should not be used as the sole basis  for treatment or other patient management decisions. Negative results must be combined with clinical observations, patient history, and epidemiological information. The expected result is Negative.  Fact Sheet for Patients: SugarRoll.be  Fact Sheet for Healthcare Providers: https://www.woods-mathews.com/  This test is not yet approved or cleared by the [place] FDA and  has been authorized for detection and/or diagnosis of SARS-CoV-2 by FDA under an Emergency Use Authorization (EUA). This EUA will remain  in effect (meaning this test can be used) for the duration of the COVID-19 declaration under Se ction 564(b)(1) of the Act, 21 U.S.C. section 360bbb-3(b)(1), unless the authorization is terminated or revoked sooner.  Performed at [facility], [place] [address] [place], [place] [address]      Labs: BNP (last 3 results) Recent Labs    12/06/20 2124 12/09/20 0337  BNP 220.0* [id number]*   Basic Metabolic Panel: Recent Labs  Lab 12/07/20 0826 12/08/20 1233 12/08/20 1752 12/09/20 0337 12/10/20 0152 12/11/20 1250  NA 131* 131*  --  131* 135 135  K 3.9 3.5  --  3.6 3.3* 3.5  CL 99 101  --  99 100 100  CO2 22 22  --  23 26 28   GLUCOSE 358* 310*  --  249* 219* 181*  BUN 41* 21  --  19 16 14   CREATININE 1.22* 0.73  --  0.74 0.70 0.65  CALCIUM 7.9* 8.2*  --  8.3* 8.2* 8.3*  MG 1.7  --  2.1  --   --   --    Liver Function Tests: Recent Labs  Lab 12/06/20 2124  AST 29  ALT 9  ALKPHOS 57  BILITOT 0.6  PROT 4.5*  ALBUMIN 2.1*   No results for input(s): LIPASE, AMYLASE in the last 168 hours. No results for input(s): AMMONIA in the last 168 hours. CBC: Recent Labs  Lab 12/06/20 0256 12/06/20 1709 12/06/20 2124 12/07/20 0826 12/08/20 1233 12/09/20 0337 12/12/20 0131  WBC 13.0*   < > 9.5 10.5 10.0 9.4 10.1  NEUTROABS 10.5*  --  7.1  --   --   --   --   HGB 8.1*   < > 6.4* 9.5* 9.7* 9.5* 9.4*  HCT 25.9*   < > 20.1*  28.8* 28.5* 27.8* 29.0*  MCV 85.2   < > 82.4 85.0 83.1 83.7 85.8  PLT 185   < >  142* 131* 154 167 281   < > = values in this interval not displayed.   Cardiac Enzymes: No results for input(s): CKTOTAL, CKMB, CKMBINDEX, TROPONINI in the last 168 hours. BNP: Invalid input(s): POCBNP CBG: Recent Labs  Lab 12/11/20 1439 12/11/20 1635 12/11/20 2123 12/12/20 0813 12/12/20 1143  GLUCAP 217* 185* 214* 167* 261*   D-Dimer No results for input(s): DDIMER in the last 72 hours. Hgb A1c No results for input(s): HGBA1C in the last 72 hours. Lipid Profile No results for input(s): CHOL, HDL, LDLCALC, TRIG, CHOLHDL, LDLDIRECT in the last 72 hours. Thyroid function studies No results for input(s): TSH, T4TOTAL, T3FREE, THYROIDAB in the last 72 hours.  Invalid input(s): FREET3 Anemia work up No results for input(s): VITAMINB12, FOLATE, FERRITIN, TIBC, IRON, RETICCTPCT in the last 72 hours. Urinalysis    Component Value Date/Time   COLORURINE YELLOW 12/07/2020 1337   APPEARANCEUR CLEAR 12/07/2020 1337   LABSPEC 1.020 12/07/2020 1337   PHURINE 5.0 12/07/2020 1337   GLUCOSEU >=500 (A) 12/07/2020 1337   HGBUR SMALL (A) 12/07/2020 1337   BILIRUBINUR NEGATIVE 12/07/2020 1337   KETONESUR 5 (A) 12/07/2020 1337   PROTEINUR NEGATIVE 12/07/2020 1337   UROBILINOGEN 0.2 02/09/2014 1603   NITRITE NEGATIVE 12/07/2020 1337   LEUKOCYTESUR SMALL (A) 12/07/2020 1337   Sepsis Labs Invalid input(s): PROCALCITONIN,  WBC,  LACTICIDVEN Microbiology Recent Results (from the past 240 hour(s))  Resp Panel by RT-PCR (Flu A&B, Covid) Nasopharyngeal Swab     Status: None   Collection Time: 12/04/20  8:13 AM   Specimen: Nasopharyngeal Swab; Nasopharyngeal(NP) swabs in vial transport medium  Result Value Ref Range Status   SARS Coronavirus 2 by RT PCR NEGATIVE NEGATIVE Final    Comment: (NOTE) SARS-CoV-2 target nucleic acids are NOT DETECTED.  The SARS-CoV-2 RNA is generally detectable in upper  respiratory specimens during the acute phase of infection. The lowest concentration of SARS-CoV-2 viral copies this assay can detect is 138 copies/mL. A negative result does not preclude SARS-Cov-2 infection and should not be used as the sole basis for treatment or other patient management decisions. A negative result may occur with  improper specimen collection/handling, submission of specimen other than nasopharyngeal swab, presence of viral mutation(s) within the areas targeted by this assay, and inadequate number of viral copies(<138 copies/mL). A negative result must be combined with clinical observations, patient history, and epidemiological information. The expected result is Negative.  Fact Sheet for Patients:  EntrepreneurPulse.com.au  Fact Sheet for Healthcare Providers:  IncredibleEmployment.be  This test is no t yet approved or cleared by the [place] FDA and  has been authorized for detection and/or diagnosis of SARS-CoV-2 by FDA under an Emergency Use Authorization (EUA). This EUA will remain  in effect (meaning this test can be used) for the duration of the COVID-19 declaration under Section 564(b)(1) of the Act, 21 U.S.C.section 360bbb-3(b)(1), unless the authorization is terminated  or revoked sooner.       Influenza A by PCR NEGATIVE NEGATIVE Final   Influenza B by PCR NEGATIVE NEGATIVE Final    Comment: (NOTE) The Xpert Xpress SARS-CoV-2/FLU/RSV plus assay is intended as an aid in the diagnosis of influenza from Nasopharyngeal swab specimens and should not be used as a sole basis for treatment. Nasal washings and aspirates are unacceptable for Xpert Xpress SARS-CoV-2/FLU/RSV testing.  Fact Sheet for Patients: EntrepreneurPulse.com.au  Fact Sheet for Healthcare Providers: IncredibleEmployment.be  This test is not yet approved or cleared by the [place] FDA and has  been  authorized for detection and/or diagnosis of SARS-CoV-2 by FDA under an Emergency Use Authorization (EUA). This EUA will remain in effect (meaning this test can be used) for the duration of the COVID-19 declaration under Section 564(b)(1) of the Act, 21 U.S.C. section 360bbb-3(b)(1), unless the authorization is terminated or revoked.  Performed at [facility], [place] [address] [place], [place] [address]   Culture, Urine     Status: None   Collection Time: 12/07/20 11:59 AM   Specimen: Urine, Random  Result Value Ref Range Status   Specimen Description URINE, RANDOM  Final   Special Requests NONE  Final   Culture   Final    NO GROWTH Performed at [facility], [place] [address], [place], [place] [address]    Report Status 12/08/2020 FINAL  Final  Culture, blood (routine x 2)     Status: None   Collection Time: 12/07/20  1:17 PM   Specimen: BLOOD  Result Value Ref Range Status   Specimen Description BLOOD RIGHT ANTECUBITAL  Final   Special Requests   Final    BOTTLES DRAWN AEROBIC AND ANAEROBIC Blood Culture adequate volume   Culture   Final    NO GROWTH 5 DAYS Performed at [facility], [place] [address] [place], [place] [address]    Report Status 12/12/2020 FINAL  Final  Culture, blood (routine x 2)     Status: None   Collection Time: 12/07/20  1:17 PM   Specimen: BLOOD  Result Value Ref Range Status   Specimen Description BLOOD BLOOD LEFT HAND  Final   Special Requests   Final    BOTTLES DRAWN AEROBIC AND ANAEROBIC Blood Culture adequate volume   Culture   Final    NO GROWTH 5 DAYS Performed at [facility], [place] [address] [place], [place] [address]    Report Status 12/12/2020 FINAL  Final  SARS CORONAVIRUS 2 (TAT 6-24 HRS) Nasopharyngeal Nasopharyngeal Swab     Status: None   Collection Time: 12/11/20  4:04 PM   Specimen: Nasopharyngeal Swab  Result Value Ref Range Status   SARS Coronavirus 2 NEGATIVE NEGATIVE Final    Comment:  (NOTE) SARS-CoV-2 target nucleic acids are NOT DETECTED.  The SARS-CoV-2 RNA is generally detectable in upper and lower respiratory specimens during the acute phase of infection. Negative results do not preclude SARS-CoV-2 infection, do not rule out co-infections with other pathogens, and should not be used as the sole basis for treatment or other patient management decisions. Negative results must be combined with clinical observations, patient history, and epidemiological information. The expected result is Negative.  Fact Sheet for Patients: SugarRoll.be  Fact Sheet for Healthcare Providers: https://www.woods-mathews.com/  This test is not yet approved or cleared by the [place] FDA and  has been authorized for detection and/or diagnosis of SARS-CoV-2 by FDA under an Emergency Use Authorization (EUA). This EUA will remain  in effect (meaning this test can be used) for the duration of the COVID-19 declaration under Se ction 564(b)(1) of the Act, 21 U.S.C. section 360bbb-3(b)(1), unless the authorization is terminated or revoked sooner.  Performed at [facility], [place] [address] [place], [place] [address]      Time coordinating discharge: Over 30 minutes  SIGNED:   Nolberto Hanlon, MD  [facility] 12/12/2020, 2:29 PM Pager   If 7PM-7AM, please contact night-coverage [email or web address] Password [email or web address]

## 2020-12-27 ENCOUNTER — Ambulatory Visit: Payer: Medicare Other | Admitting: Physician Assistant

## 2021-01-03 ENCOUNTER — Other Ambulatory Visit (HOSPITAL_COMMUNITY): Payer: Self-pay

## 2021-01-29 ENCOUNTER — Ambulatory Visit
Admission: RE | Admit: 2021-01-29 | Discharge: 2021-01-29 | Disposition: A | Discharge status: Home / Self Care | Payer: Medicare Other | Source: Ambulatory Visit | Attending: Physician Assistant | Admitting: Physician Assistant

## 2021-01-29 ENCOUNTER — Other Ambulatory Visit: Payer: Self-pay

## 2021-01-29 ENCOUNTER — Encounter: Payer: Self-pay | Admitting: Physician Assistant

## 2021-01-29 ENCOUNTER — Ambulatory Visit (INDEPENDENT_AMBULATORY_CARE_PROVIDER_SITE_OTHER): Payer: Medicare Other | Admitting: Physician Assistant

## 2021-01-29 VITALS — BP 140/62 | HR 60 | Ht 65.0 in | Wt 182.0 lb

## 2021-01-29 DIAGNOSIS — E119 Type 2 diabetes mellitus without complications: Secondary | ICD-10-CM

## 2021-01-29 DIAGNOSIS — I2510 Atherosclerotic heart disease of native coronary artery without angina pectoris: Secondary | ICD-10-CM

## 2021-01-29 DIAGNOSIS — Z79899 Other long term (current) drug therapy: Secondary | ICD-10-CM

## 2021-01-29 DIAGNOSIS — T462X5A Adverse effect of other antidysrhythmic drugs, initial encounter: Secondary | ICD-10-CM | POA: Diagnosis not present

## 2021-01-29 DIAGNOSIS — E785 Hyperlipidemia, unspecified: Secondary | ICD-10-CM

## 2021-01-29 DIAGNOSIS — I480 Paroxysmal atrial fibrillation: Secondary | ICD-10-CM

## 2021-01-29 DIAGNOSIS — R5383 Other fatigue: Secondary | ICD-10-CM

## 2021-01-29 DIAGNOSIS — I10 Essential (primary) hypertension: Secondary | ICD-10-CM

## 2021-01-29 MED ORDER — METOPROLOL SUCCINATE ER 50 MG PO TB24: 100.0000 mg | ORAL_TABLET | Freq: Two times a day (BID) | ORAL | Status: DC

## 2021-01-29 NOTE — Progress Notes (Signed)
Cardiology Office Note:    Date:  01/31/2021   ID:  Alexis [name], DOB Sep 27, 1950, MRN [id number]  PCP:  Deland Pretty, MD   [facility] Cardiologist:  Pixie Casino, MD     Referring MD: Deland Pretty, MD   Chief Complaint  Patient presents with   Follow-up    Post hospital.   Edema   Chest Pain    Pressure.    History of Present Illness:    Alexis [name] is a 70 y.o. female with a hx of HLD, HTN, DM II, fatty liver disease, IBS, CAD, OSA and GERD. There was a past history of mild out of the hospital MI, however this was likely due to panic attack.  There was no troponin evidence at the time due to her delayed presentation.  Myoview since then has not shown any scar.  In the past, she wore a heart monitor in 2004 due to episode of syncope, this demonstrated occasional unifocal PVCs, no sustained arrhythmia.  Myoview obtained in June 2014 showed EF 63%, no evidence of previous infarct.  In 2018, she wore a 48-hour Holter monitor that showed occasional PACs and PVCs as well as short run of SVT.  She had a coronary CTA in 08/2016 that showed mild two-vessel disease with plaque in the LAD and RCA, coronary calcium score of 90.  She also underwent repeat sleep study that demonstrated moderate obstructive sleep apnea.  She was started on Farxiga for diabetes in the setting of coronary artery disease. Carotid Doppler obtained in August 2019 showed mild disease bilaterally.  She has been seen by Dr. Jannifer Franklin of neurology for mild memory disturbance.  I last saw the patient on 11/07/2020 for precordial chest pain.  Symptom does not have clear correlation with exertion.  I recommended a repeat coronary CT, this was performed on 11/21/2020, this revealed diffuse nonobstructive CAD, coronary calcium score 469 which placed the patient in 93rd percentile for age and sex matched control, small PFO.  More recently, patient was hospitalized with left hip fracture, she developed atrial  fibrillation postop.  Her Plavix was stopped and she was switched to Eliquis 5 mg twice a day.  Patient was placed on amiodarone during this admission however was still going in and out of atrial fibrillation.  She was also treated for acute diastolic CHF.  Patient presents today for follow-up.  She has switched her Lasix to as needed.  She is compliant with amiodarone, she can reduce amiodarone dose to 200 mg daily.  Given the need for amiodarone, she will need TSH, liver function test and a chest x-ray.  She is actually taking amlodipine 5 mg daily and valsartan 320 mg daily, both were prescribed by her PCP and were previously not on her medication list.  Instead of 100 mg daily of metoprolol succinate, she is taking 100 mg twice a day.     Past Medical History:  Diagnosis Date   Anxiety    Arthritis    "knees" (05/25/2014)   Bursitis of knee    "both"   Chronic back pain    Depression    Diabetes mellitus type II    Diabetic peripheral neuropathy ([place]) 10/20/2018   Diverticulitis    Fatty liver    Fibromyalgia    Gastric polyp    hyperplastic   Gastroparesis    "recently dx'd" (05/25/2014)   GERD (gastroesophageal reflux disease)    H/O hiatal hernia    Headache(784.0)    "  monthly" (05/25/2014)   Heart attack ([place]) 2014   "mild"   Hx of gastritis    Hyperlipidemia    Hypertension    Irritable bowel syndrome (IBS)    Memory difficulties 09/18/2017   Migraine without aura, without mention of intractable migraine without mention of status migrainosus    "related to allergies; have them in the spring and fall" (05/25/2014)   Obesity    Pancreatic cyst    peripancreatic cystic lesion   Renal cyst    Sleep apnea    "wore mask; took it off in my sleep; quit wearing it" (05/25/2014)   Sprain of neck 03/18/2013   Uterine cancer ([place]) dx'd 2000   surg only   Vasculitis ([place])    "irritates my legs"   Wears glasses     Past Surgical History:  Procedure Laterality Date   ANTERIOR  CERVICAL DECOMPRESSION/DISCECTOMY FUSION 4 LEVELS N/A 05/07/2018   Procedure: ANTERIOR CERVICAL DECOMPRESSION FUSION, CERVICAL 4-5,CERVICAL 5-6, CERVICAL 6-7 WITH INSTRUMENTATION AND ALLOGRAFT;  Surgeon: Phylliss Bob, MD;  Location: [facility];  Service: Orthopedics;  Laterality: N/A;   APPENDECTOMY  ~ [place]   LEFT HEART CATHETERIZATION WITH CORONARY ANGIOGRAM N/A 05/26/2014   Procedure: LEFT HEART CATHETERIZATION WITH CORONARY ANGIOGRAM;  Surgeon: Troy Sine, MD;  Location: [facility] CATH LAB;  Service: Cardiovascular;  Laterality: N/A;   SINUS SURGERY WITH INSTATRAK  2000   TOTAL HIP ARTHROPLASTY Left 12/05/2020   Procedure: TOTAL HIP ARTHROPLASTY ANTERIOR APPROACH;  Surgeon: Rod Can, MD;  Location: [facility];  Service: Orthopedics;  Laterality: Left;   TUBAL LIGATION  ~ 1982   VAGINAL HYSTERECTOMY  2000    Current Medications: Current Meds  Medication Sig   acetaminophen (TYLENOL) 325 MG tablet Take 2 tablets (650 mg total) by mouth every 6 (six) hours as needed for mild pain or moderate pain.   ALPRAZolam (XANAX) 0.5 MG tablet Take 0.5-1 mg by mouth at bedtime as needed for anxiety.    amiodarone (PACERONE) 200 MG tablet Take 1 tablet (200 mg total) by mouth 2 (two) times daily.   amLODipine (NORVASC) 5 MG tablet Take 5 mg by mouth daily.   apixaban (ELIQUIS) 5 MG TABS tablet Take 1 tablet (5 mg total) by mouth 2 (two) times daily.   b complex vitamins tablet Take 1 tablet by mouth at bedtime.   Dulaglutide 3 MG/0.5ML SOPN Inject 3 mg into the skin every Sunday.   FARXIGA 10 MG TABS tablet Take 10 mg by mouth daily.   feeding supplement, GLUCERNA SHAKE, (GLUCERNA SHAKE) LIQD Take 237 mLs by mouth 2 (two) times daily between meals.   fish oil-omega-3 fatty acids 1000 MG capsule Take 1 g by mouth at bedtime.    metFORMIN (GLUCOPHAGE-XR) 500 MG 24 hr tablet Take 500 mg by mouth daily.   pregabalin (LYRICA)  50 MG capsule Take 50 mg by mouth 2 (two) times daily.   RABEprazole (ACIPHEX) 20 MG tablet Take 1 tablet (20 mg total) by mouth 2 (two) times daily.   rizatriptan (MAXALT) 10 MG tablet Take 10 mg by mouth as needed for migraine. May repeat in 2 hours if needed   TRESIBA FLEXTOUCH 100 UNIT/ML FlexTouch Pen Inject 25 Units into the skin daily.   valsartan (DIOVAN) 320 MG tablet Take 320 mg by mouth daily.   venlafaxine XR (EFFEXOR-XR) 150 MG 24 hr capsule Take 150 mg by  mouth 2 (two) times daily.   zaleplon (SONATA) 5 MG capsule Take 5 mg by mouth at bedtime as needed for sleep.   [DISCONTINUED] atorvastatin (LIPITOR) 20 MG tablet Take 20 mg by mouth at bedtime.   [DISCONTINUED] cyanocobalamin 1000 MCG tablet Take 1,000 mcg by mouth at bedtime.   [DISCONTINUED] metoprolol succinate (TOPROL-XL) 100 MG 24 hr tablet Take 1 tablet (100 mg total) by mouth daily. Take with or immediately following a meal. (Patient taking differently: Take 50 mg by mouth in the morning and at bedtime. Take with or immediately following a meal.)     Allergies:   Aspirin, Codeine, Naproxen, and Sulfonamide derivatives   Social History   Socioeconomic History   Marital status: Married    Spouse name: Not on file   Number of children: 2   Years of education: Not on file   Highest education level: Not on file  Occupational History   Occupation: Retired    Fish farm manager: RETIRED  Tobacco Use   Smoking status: Never   Smokeless tobacco: Never  Vaping Use   Vaping Use: Never used  Substance and Sexual Activity   Alcohol use: No   Drug use: No   Sexual activity: Never  Other Topics Concern   Not on file  Social History Narrative   Daily Caffeine, Coke   Social Determinants of Health   Financial Resource Strain: Not on file  Food Insecurity: Not on file  Transportation Needs: Not on file  Physical Activity: Not on file  Stress: Not on file  Social Connections: Not on file     Family History: The patient's  family history includes Breast cancer in her sister; Colon cancer in her maternal uncle; Colon polyps in her father; Diabetes in her maternal aunt; Heart disease in her father, maternal uncle, and another family member; Irritable bowel syndrome in her daughter; Ovarian cancer in her maternal aunt; Stomach cancer in her maternal aunt.  ROS:   Please see the history of present illness.     All other systems reviewed and are negative.  EKGs/Labs/Other Studies Reviewed:    The following studies were reviewed today:  Echo 12/07/2020  1. Left ventricular ejection fraction, by estimation, is 60 to 65%. The  left ventricle has normal function. The left ventricle has no regional  wall motion abnormalities. Left ventricular diastolic parameters were  normal.   2. Right ventricular systolic function is normal. The right ventricular  size is mildly enlarged. There is mildly elevated pulmonary artery  systolic pressure. The estimated right ventricular systolic pressure is  82.4 mmHg.   3. The mitral valve is degenerative. No evidence of mitral valve  regurgitation. No evidence of mitral stenosis. Moderate mitral annular  calcification.   4. The aortic valve is tricuspid. There is mild calcification of the  aortic valve. There is mild thickening of the aortic valve. Aortic valve  regurgitation is not visualized. No aortic stenosis is present.   5. The inferior vena cava is normal in size with greater than 50%  respiratory variability, suggesting right atrial pressure of 3 mmHg.   EKG:  EKG is ordered today.  The ekg ordered today demonstrates normal sinus rhythm, no significant ST-T wave changes.  Poor R wave progression in the anterior leads.  Recent Labs: 12/08/2020: Magnesium 2.1 12/09/2020: B Natriuretic Peptide 530.1 12/11/2020: BUN 14; Creatinine, Ser 0.65; Potassium 3.5; Sodium 135 12/12/2020: Hemoglobin 9.4; Platelets 281 01/29/2021: ALT 26; TSH 3.920  Recent Lipid Panel    Component  Value  Date/Time   CHOL 142 10/23/2016 1115   TRIG 109 10/23/2016 1115   HDL 52 10/23/2016 1115   CHOLHDL 2.7 10/23/2016 1115   VLDL 22 10/23/2016 1115   LDLCALC 68 10/23/2016 1115     Risk Assessment/Calculations:    CHA2DS2-VASc Score = 5  This indicates a 7.2% annual risk of stroke. The patient's score is based upon: CHF History: No HTN History: Yes Diabetes History: Yes Stroke History: No Vascular Disease History: Yes Age Score: 1 Gender Score: 1         Physical Exam:    VS:  BP 140/62 (BP Location: Right Arm, Patient Position: Sitting, Cuff Size: Normal)   Pulse 60   Ht 5\' 5"  (1.651 m)   Wt 182 lb (82.6 kg)   BMI 30.29 kg/m     Wt Readings from Last 3 Encounters:  01/29/21 182 lb (82.6 kg)  12/06/20 184 lb 8.4 oz (83.7 kg)  11/07/20 184 lb 9.6 oz (83.7 kg)     GEN:  Well nourished, well developed in no acute distress HEENT: Normal NECK: No JVD; No carotid bruits LYMPHATICS: No lymphadenopathy CARDIAC: RRR, no murmurs, rubs, gallops RESPIRATORY:  Clear to auscultation without rales, wheezing or rhonchi  ABDOMEN: Soft, non-tender, non-distended MUSCULOSKELETAL:  No edema; No deformity  SKIN: Warm and dry NEUROLOGIC:  Alert and oriented x 3 PSYCHIATRIC:  Normal affect   ASSESSMENT:    1. PAF (paroxysmal atrial fibrillation) ([place])   2. Medication management   3. Adverse effect of amiodarone, initial encounter   4. Fatigue, unspecified type   5. Coronary artery disease involving native coronary artery of native heart without angina pectoris   6. Essential hypertension   7. Hyperlipidemia LDL goal <70   8. Controlled type 2 diabetes mellitus without complication, without long-term current use of insulin (HCC)    PLAN:    In order of problems listed above:  PAF: Continue amiodarone, Eliquis and metoprolol.  Instead of metoprolol 100 mg that was discharged, patient is actually taking 100 mg twice daily dosing.  Adverse effect of amiodarone: Patient has  been instructed to obtain yearly eye exam.  We will need TSH, liver function test and chest x-ray.  Fatigue: Obtain TSH  CAD: Nonobstructive disease noted on coronary CT  Hypertension: Blood pressure well controlled on current therapy  Hyperlipidemia: Lipitor was taken off recently due to unknown reason.  Will need fasting lipid panel, if liver enzyme is normal, would recommend restart Lipitor.  DM2: On Farxiga.  Managed by primary care provider.        Medication Adjustments/Labs and Tests Ordered: Current medicines are reviewed at length with the patient today.  Concerns regarding medicines are outlined above.  Orders Placed This Encounter  Procedures   DG Chest 2 View   TSH   Hepatic function panel   EKG 12-Lead   Meds ordered this encounter  Medications   metoprolol succinate (TOPROL-XL) 50 MG 24 hr tablet    Sig: Take 2 tablets (100 mg total) by mouth in the morning and at bedtime. Take with or immediately following a meal.    Patient Instructions  Medication Instructions:  TAKE Metoprolol Succinate 100 mg (2-50 mg tablets) 2 times a day.  *If you need a refill on your cardiac medications before your next appointment, please call your pharmacy*  Lab Work: Your physician recommends that you return for lab work TODAY: LFT TSH  If you have labs (blood work) drawn today and your tests  are completely normal, you will receive your results only by: MyChart Message (if you have MyChart) OR A paper copy in the mail If you have any lab test that is abnormal or we need to change your treatment, we will call you to review the results.  Testing/Procedures: A chest x-ray takes a picture of the organs and structures inside the chest, including the heart, lungs, and blood vessels. This test can show several things, including, whether the heart is enlarges; whether fluid is building up in the lungs; and whether pacemaker / defibrillator leads are still in place. This test is  performed at [address] at [facility]  Please schedule for 1 week   Follow-Up: At [facility], you and your health needs are our priority.  As part of our continuing mission to provide you with exceptional heart care, we have created designated Provider Care Teams.  These Care Teams include your primary Cardiologist (physician) and Advanced Practice Providers (APPs -  Physician Assistants and Nurse Practitioners) who all work together to provide you with the care you need, when you need it.   Your next appointment:   3 month(s)  The format for your next appointment:   In Person  Provider:   Raliegh Ip Mali Hilty, MD  Other Instructions    Signed, Almyra Deforest, [place]  01/31/2021 10:39 PM    [place]

## 2021-01-29 NOTE — Patient Instructions (Signed)
Medication Instructions:  TAKE Metoprolol Succinate 100 mg (2-50 mg tablets) 2 times a day.  *If you need a refill on your cardiac medications before your next appointment, please call your pharmacy*  Lab Work: Your physician recommends that you return for lab work TODAY: LFT TSH  If you have labs (blood work) drawn today and your tests are completely normal, you will receive your results only by: [place] (if you have MyChart) OR A paper copy in the mail If you have any lab test that is abnormal or we need to change your treatment, we will call you to review the results.  Testing/Procedures: A chest x-ray takes a picture of the organs and structures inside the chest, including the heart, lungs, and blood vessels. This test can show several things, including, whether the heart is enlarges; whether fluid is building up in the lungs; and whether pacemaker / defibrillator leads are still in place. This test is performed at [address] at [facility]  Please schedule for 1 week   Follow-Up: At [facility], you and your health needs are our priority.  As part of our continuing mission to provide you with exceptional heart care, we have created designated Provider Care Teams.  These Care Teams include your primary Cardiologist (physician) and Advanced Practice Providers (APPs -  Physician Assistants and Nurse Practitioners) who all work together to provide you with the care you need, when you need it.   Your next appointment:   3 month(s)  The format for your next appointment:   In Person  Provider:   K. Mali Hilty, MD  Other Instructions

## 2021-01-30 LAB — HEPATIC FUNCTION PANEL
ALT: 26 IU/L (ref 0–32)
AST: 24 IU/L (ref 0–40)
Albumin: 4 g/dL (ref 3.8–4.8)
Alkaline Phosphatase: 139 IU/L — ABNORMAL HIGH (ref 44–121)
Bilirubin Total: 0.4 mg/dL (ref 0.0–1.2)
Bilirubin, Direct: 0.17 mg/dL (ref 0.00–0.40)
Total Protein: 7.2 g/dL (ref 6.0–8.5)

## 2021-01-30 LAB — TSH: TSH: 3.92 u[IU]/mL (ref 0.450–4.500)

## 2021-02-01 NOTE — Progress Notes (Signed)
Mild volume overload, sent result via MyChart, instructed the patient to take extra dose of lasix for 2 days. But no sign of amiodarone induced toxicity on chest x ray image

## 2021-02-01 NOTE — Progress Notes (Signed)
Normal thyroid, normal liver enzyme

## 2021-02-23 ENCOUNTER — Encounter: Payer: Self-pay | Admitting: Gastroenterology

## 2021-03-07 ENCOUNTER — Telehealth: Payer: Self-pay | Admitting: Internal Medicine

## 2021-03-08 ENCOUNTER — Telehealth: Payer: Self-pay | Admitting: Internal Medicine

## 2021-03-08 NOTE — Telephone Encounter (Signed)
Left a message for the patient to call back.  

## 2021-03-08 NOTE — Telephone Encounter (Signed)
Pt c/o medication issue:  1. Name of Medication: Atorvastain   2. How are you currently taking this medication (dosage and times per day)? '20mg'$  1 tablet at night time  3. Are you having a reaction (difficulty breathing--STAT)? no  4. What is your medication issue? Patient stated that when she was in the hospital the dr had her taking the medication. But its not longer listed under the patient medication. So patient would like to know if the Dr. Debara Pickett want her to continue taking the medication if so she needs a prescription for it. Please advise

## 2021-03-09 ENCOUNTER — Telehealth: Payer: Self-pay | Admitting: Internal Medicine

## 2021-03-09 NOTE — Telephone Encounter (Signed)
I have reviewed pt's chart and atorvastatin was d/c at hospital discharge. Pt was here 01-29-21 to follow up from hosp visit and saw [facility] he did not restart Atorva. Also Lipid panel was received and scanned into chart (01-04-21- Med) and LDL=43. Looks like pt should con't with Atorvastatin.  Returned call to pt and she is asking if she is to take the Atorvastatin (lipitor) informed pt that all the notes in EPIC says to take this medication. Pt states that Dr Garnet Koyanagi, MD was the one who started this medication. Informed pt that this is who would need to refill not [place]. They stared and is doing the labs also. Pt verbalizes understanding. She will call them for refill.

## 2021-03-09 NOTE — Telephone Encounter (Signed)
Patient is calling triage back

## 2021-03-09 NOTE — Telephone Encounter (Signed)
See prev message

## 2021-03-19 ENCOUNTER — Ambulatory Visit: Payer: PRIVATE HEALTH INSURANCE | Admitting: Neurology

## 2021-04-03 ENCOUNTER — Encounter: Payer: Self-pay | Admitting: Neurology

## 2021-04-03 ENCOUNTER — Ambulatory Visit (INDEPENDENT_AMBULATORY_CARE_PROVIDER_SITE_OTHER): Payer: Medicare Other | Admitting: Neurology

## 2021-04-03 VITALS — BP 140/64 | HR 54 | Ht 65.0 in | Wt 180.0 lb

## 2021-04-03 DIAGNOSIS — M797 Fibromyalgia: Secondary | ICD-10-CM

## 2021-04-03 DIAGNOSIS — E1142 Type 2 diabetes mellitus with diabetic polyneuropathy: Secondary | ICD-10-CM | POA: Diagnosis not present

## 2021-04-03 DIAGNOSIS — G959 Disease of spinal cord, unspecified: Secondary | ICD-10-CM | POA: Diagnosis not present

## 2021-04-03 DIAGNOSIS — R413 Other amnesia: Secondary | ICD-10-CM | POA: Diagnosis not present

## 2021-04-03 NOTE — Progress Notes (Signed)
Reason for visit: Diabetic peripheral neuropathy, cervical myelopathy, gait disorder, mild memory disorder  Alexis [name] is an 70 y.o. female  History of present illness:  Alexis [name] is a 70 year old right-handed white female with a history of diabetes that has in the past been poorly controlled.  She has a underlying peripheral neuropathy, she takes Lyrica for some of the discomfort.  She has a history of cervical spine surgery with a prior cervical myelopathy and an underlying gait disorder.  On 04 December 2020, she fell at home when she tripped over a power cord.  She fractured her left hip, she required some therapy in an extended care facility and is now getting outpatient physical therapy.  She feels as if the hip flexion on the left is somewhat weak.  She has not had any further falls.  She is not using a cane or a walker for ambulation at this point.  She is doing some exercises with the legs.  She recently over the last couple months has noted some tremor in both arms, she has had some trouble with sloppy handwriting.  She was placed on amiodarone during the recent hospitalization as she went to the atrial fibrillation, she is now on Eliquis as well.  The patient reports some ongoing mild memory issues, she mainly has word finding problems but otherwise has not altered any of her activities of daily living because of memory.  She returns to this office for an evaluation.  Past Medical History:  Diagnosis Date   Anxiety    Arthritis    "knees" (05/25/2014)   Bursitis of knee    "both"   Chronic back pain    Depression    Diabetes mellitus type II    Diabetic peripheral neuropathy ([place]) 10/20/2018   Diverticulitis    Fatty liver    Fibromyalgia    Gastric polyp    hyperplastic   Gastroparesis    "recently dx'd" (05/25/2014)   GERD (gastroesophageal reflux disease)    H/O hiatal hernia    Headache(784.0)    "monthly" (05/25/2014)   Heart attack ([place]) 2014   "mild"   Hx  of gastritis    Hyperlipidemia    Hypertension    Irritable bowel syndrome (IBS)    Memory difficulties 09/18/2017   Migraine without aura, without mention of intractable migraine without mention of status migrainosus    "related to allergies; have them in the spring and fall" (05/25/2014)   Obesity    Pancreatic cyst    peripancreatic cystic lesion   Renal cyst    Sleep apnea    "wore mask; took it off in my sleep; quit wearing it" (05/25/2014)   Sprain of neck 03/18/2013   Uterine cancer ([place]) dx'd 2000   surg only   Vasculitis ([place])    "irritates my legs"   Wears glasses     Past Surgical History:  Procedure Laterality Date   ANTERIOR CERVICAL DECOMPRESSION/DISCECTOMY FUSION 4 LEVELS N/A 05/07/2018   Procedure: ANTERIOR CERVICAL DECOMPRESSION FUSION, CERVICAL 4-5,CERVICAL 5-6, CERVICAL 6-7 WITH INSTRUMENTATION AND ALLOGRAFT;  Surgeon: Phylliss Bob, MD;  Location: [place];  Service: Orthopedics;  Laterality: N/A;   APPENDECTOMY  ~ [place]   LEFT HEART CATHETERIZATION WITH CORONARY ANGIOGRAM N/A 05/26/2014   Procedure: LEFT HEART CATHETERIZATION WITH CORONARY ANGIOGRAM;  Surgeon: Troy Sine, MD;  Location: [facility] CATH LAB;  Service:  Cardiovascular;  Laterality: N/A;   SINUS SURGERY WITH INSTATRAK  2000   TOTAL HIP ARTHROPLASTY Left 12/05/2020   Procedure: TOTAL HIP ARTHROPLASTY ANTERIOR APPROACH;  Surgeon: Rod Can, MD;  Location: [place];  Service: Orthopedics;  Laterality: Left;   TUBAL LIGATION  ~ 1982   VAGINAL HYSTERECTOMY  2000    Family History  Problem Relation Age of Onset   Breast cancer Sister    Colon polyps Father    Heart disease Father    Colon cancer Maternal Uncle    Ovarian cancer Maternal Aunt    Stomach cancer Maternal Aunt    Diabetes Maternal Aunt    Heart disease Maternal Uncle    Heart disease Other        Grandparents   Irritable bowel syndrome Daughter      Social history:  reports that she has never smoked. She has never used smokeless tobacco. She reports that she does not drink alcohol and does not use drugs.    Allergies  Allergen Reactions   Aspirin Other (See Comments)    Reaction: Vasculitis per MD   Codeine Itching and Rash   Naproxen Nausea Only    Reaction: Makes stomach upset/irritated.   Sulfonamide Derivatives Itching and Rash    Medications:  Prior to Admission medications   Medication Sig Start Date End Date Taking? Authorizing Provider  acetaminophen (TYLENOL) 325 MG tablet Take 2 tablets (650 mg total) by mouth every 6 (six) hours as needed for mild pain or moderate pain. 12/12/20  Yes Nolberto Hanlon, MD  ALPRAZolam Duanne Moron) 0.5 MG tablet Take 0.5-1 mg by mouth at bedtime as needed for anxiety.    Yes [provider]  amiodarone (PACERONE) 200 MG tablet Take 1 tablet (200 mg total) by mouth 2 (two) times daily. 12/12/20  Yes Nolberto Hanlon, MD  amLODipine (NORVASC) 5 MG tablet Take 5 mg by mouth daily.   Yes [provider]  amLODipine (NORVASC) 5 MG tablet amlodipine 5 mg tablet  TAKE 1 TABLET BY MOUTH EVERY DAY   Yes [provider]  apixaban (ELIQUIS) 5 MG TABS tablet Take 1 tablet (5 mg total) by mouth 2 (two) times daily. 12/12/20  Yes Nolberto Hanlon, MD  b complex vitamins tablet Take 1 tablet by mouth at bedtime.   Yes [provider]  FARXIGA 10 MG TABS tablet Take 10 mg by mouth daily. 09/10/19  Yes [provider]  fish oil-omega-3 fatty acids 1000 MG capsule Take 1 g by mouth at bedtime.    Yes [provider]  metFORMIN (GLUCOPHAGE-XR) 500 MG 24 hr tablet Take 500 mg by mouth daily. 08/24/19  Yes [provider]  metoprolol succinate (TOPROL-XL) 50 MG 24 hr tablet Take 2 tablets (100 mg total) by mouth in the morning and at bedtime. Take with or immediately following a meal. 01/29/21  Yes Almyra Deforest, PA  pregabalin (LYRICA) 50 MG capsule Take 50 mg by mouth 2  (two) times daily.   Yes [provider]  RABEprazole (ACIPHEX) 20 MG tablet Take 1 tablet (20 mg total) by mouth 2 (two) times daily. 04/22/12  Yes Lafayette Dragon, MD  tamsulosin (FLOMAX) 0.4 MG CAPS capsule Take 0.4 mg by mouth as needed. 07/17/16  Yes [provider]  TRESIBA FLEXTOUCH 100 UNIT/ML FlexTouch Pen Inject 25 Units into the skin daily. 09/29/19  Yes [provider]  valsartan (DIOVAN) 320 MG tablet Take 320 mg by mouth daily.   Yes [provider]  venlafaxine XR (EFFEXOR-XR) 150 MG 24 hr capsule Take 150 mg by mouth 2 (two) times daily.   Yes [provider]  zaleplon (SONATA) 5 MG capsule Take 5 mg by mouth at bedtime as needed for sleep.   Yes [provider]    ROS:  Out of a complete 14 system review of symptoms, the patient complains only of the following symptoms, and all other reviewed systems are negative.  Mild memory problems Tremor Walking difficulty  Blood pressure 140/64, pulse (!) 54, height '5\' 5"'$  (1.651 m), weight 180 lb (81.6 kg).  Physical Exam  General: The patient is alert and cooperative at the time of the examination.  Skin: No significant peripheral edema is noted.   Neurologic Exam  Mental status: The patient is alert and oriented x 3 at the time of the examination. The Mini-Mental status examination done today shows a total score 27/30.   Cranial nerves: Facial symmetry is present. Speech is normal, no aphasia or dysarthria is noted. Extraocular movements are full. Visual fields are full.  Motor: The patient has good strength in all 4 extremities, with exception of some mild giveaway weakness of the left leg with hip flexion.  Sensory examination: Soft touch sensation is symmetric on the face, arms, and legs.  Coordination: The patient has good finger-nose-finger and heel-to-shin bilaterally.  The patient does have a fine intention tremor with finger-nose-finger bilaterally.  Gait and  station: The patient has a slightly wide-based gait, the patient walk independently. Tandem gait is unsteady.  Romberg is negative. No drift is seen.  Reflexes: Deep tendon reflexes are symmetric.   CT head 12/12/20:  IMPRESSION: No acute abnormality no change from the recent CT.   * CT scan images were reviewed online. I agree with the written report.    Assessment/Plan:  1.  Diabetic peripheral neuropathy  2.  Cervical myelopathy  3.  Gait disturbance  4.  Recent left hip fracture  5.  Mild cognitive impairment  We will continue to follow the memory on a conservative basis, I would not recommend addition of a medication for memory currently.  The patient will continue Lyrica for the neuropathy issue, she will follow-up here in 6 months.  She is continuing physical therapy to help strengthen the legs.  We will need to follow-up for the mild tremor over time, this could potentially be related to the use of amiodarone.  Jill Alexanders MD 04/03/2021 3:40 PM  [facility] [address] [place], [place] [address]  Phone [phone] Fax [phone]

## 2021-04-24 ENCOUNTER — Ambulatory Visit: Payer: Medicare Other | Admitting: Internal Medicine

## 2021-04-26 ENCOUNTER — Emergency Department (HOSPITAL_COMMUNITY): Payer: Medicare Other

## 2021-04-26 ENCOUNTER — Other Ambulatory Visit: Payer: Self-pay

## 2021-04-26 ENCOUNTER — Inpatient Hospital Stay (HOSPITAL_COMMUNITY)
Admission: EM | Admit: 2021-04-26 | Discharge: 2021-04-29 | DRG: 291 | Disposition: A | Discharge status: Home / Self Care | Payer: Medicare Other | Attending: Family Medicine | Admitting: Family Medicine

## 2021-04-26 ENCOUNTER — Inpatient Hospital Stay (HOSPITAL_COMMUNITY): Payer: Medicare Other

## 2021-04-26 ENCOUNTER — Encounter (HOSPITAL_COMMUNITY): Payer: Self-pay | Admitting: Emergency Medicine

## 2021-04-26 DIAGNOSIS — F32A Depression, unspecified: Secondary | ICD-10-CM | POA: Diagnosis present

## 2021-04-26 DIAGNOSIS — K760 Fatty (change of) liver, not elsewhere classified: Secondary | ICD-10-CM | POA: Diagnosis present

## 2021-04-26 DIAGNOSIS — I5043 Acute on chronic combined systolic (congestive) and diastolic (congestive) heart failure: Secondary | ICD-10-CM | POA: Diagnosis present

## 2021-04-26 DIAGNOSIS — Z885 Allergy status to narcotic agent status: Secondary | ICD-10-CM

## 2021-04-26 DIAGNOSIS — I110 Hypertensive heart disease with heart failure: Secondary | ICD-10-CM | POA: Diagnosis present

## 2021-04-26 DIAGNOSIS — Z981 Arthrodesis status: Secondary | ICD-10-CM

## 2021-04-26 DIAGNOSIS — Z886 Allergy status to analgesic agent status: Secondary | ICD-10-CM

## 2021-04-26 DIAGNOSIS — E1142 Type 2 diabetes mellitus with diabetic polyneuropathy: Secondary | ICD-10-CM | POA: Diagnosis present

## 2021-04-26 DIAGNOSIS — M549 Dorsalgia, unspecified: Secondary | ICD-10-CM | POA: Diagnosis present

## 2021-04-26 DIAGNOSIS — I10 Essential (primary) hypertension: Secondary | ICD-10-CM | POA: Diagnosis present

## 2021-04-26 DIAGNOSIS — E669 Obesity, unspecified: Secondary | ICD-10-CM | POA: Diagnosis present

## 2021-04-26 DIAGNOSIS — E785 Hyperlipidemia, unspecified: Secondary | ICD-10-CM | POA: Diagnosis present

## 2021-04-26 DIAGNOSIS — Z8542 Personal history of malignant neoplasm of other parts of uterus: Secondary | ICD-10-CM

## 2021-04-26 DIAGNOSIS — Z96642 Presence of left artificial hip joint: Secondary | ICD-10-CM | POA: Diagnosis present

## 2021-04-26 DIAGNOSIS — Z882 Allergy status to sulfonamides status: Secondary | ICD-10-CM

## 2021-04-26 DIAGNOSIS — G4733 Obstructive sleep apnea (adult) (pediatric): Secondary | ICD-10-CM | POA: Diagnosis present

## 2021-04-26 DIAGNOSIS — I248 Other forms of acute ischemic heart disease: Secondary | ICD-10-CM | POA: Diagnosis present

## 2021-04-26 DIAGNOSIS — F419 Anxiety disorder, unspecified: Secondary | ICD-10-CM | POA: Diagnosis present

## 2021-04-26 DIAGNOSIS — E1165 Type 2 diabetes mellitus with hyperglycemia: Secondary | ICD-10-CM | POA: Diagnosis present

## 2021-04-26 DIAGNOSIS — K589 Irritable bowel syndrome without diarrhea: Secondary | ICD-10-CM | POA: Diagnosis present

## 2021-04-26 DIAGNOSIS — I480 Paroxysmal atrial fibrillation: Secondary | ICD-10-CM | POA: Diagnosis present

## 2021-04-26 DIAGNOSIS — Z20822 Contact with and (suspected) exposure to covid-19: Secondary | ICD-10-CM | POA: Diagnosis present

## 2021-04-26 DIAGNOSIS — E876 Hypokalemia: Secondary | ICD-10-CM | POA: Diagnosis present

## 2021-04-26 DIAGNOSIS — E1143 Type 2 diabetes mellitus with diabetic autonomic (poly)neuropathy: Secondary | ICD-10-CM | POA: Diagnosis present

## 2021-04-26 DIAGNOSIS — J9601 Acute respiratory failure with hypoxia: Secondary | ICD-10-CM | POA: Diagnosis present

## 2021-04-26 DIAGNOSIS — K3184 Gastroparesis: Secondary | ICD-10-CM | POA: Diagnosis present

## 2021-04-26 DIAGNOSIS — M797 Fibromyalgia: Secondary | ICD-10-CM | POA: Diagnosis present

## 2021-04-26 DIAGNOSIS — Z6831 Body mass index (BMI) 31.0-31.9, adult: Secondary | ICD-10-CM

## 2021-04-26 DIAGNOSIS — I5021 Acute systolic (congestive) heart failure: Secondary | ICD-10-CM | POA: Diagnosis not present

## 2021-04-26 DIAGNOSIS — Z7901 Long term (current) use of anticoagulants: Secondary | ICD-10-CM

## 2021-04-26 DIAGNOSIS — Z79899 Other long term (current) drug therapy: Secondary | ICD-10-CM

## 2021-04-26 DIAGNOSIS — Z7984 Long term (current) use of oral hypoglycemic drugs: Secondary | ICD-10-CM

## 2021-04-26 DIAGNOSIS — K219 Gastro-esophageal reflux disease without esophagitis: Secondary | ICD-10-CM | POA: Diagnosis present

## 2021-04-26 DIAGNOSIS — G8929 Other chronic pain: Secondary | ICD-10-CM | POA: Diagnosis present

## 2021-04-26 DIAGNOSIS — E119 Type 2 diabetes mellitus without complications: Secondary | ICD-10-CM | POA: Diagnosis present

## 2021-04-26 DIAGNOSIS — I509 Heart failure, unspecified: Secondary | ICD-10-CM | POA: Diagnosis not present

## 2021-04-26 DIAGNOSIS — Z794 Long term (current) use of insulin: Secondary | ICD-10-CM

## 2021-04-26 DIAGNOSIS — Z8249 Family history of ischemic heart disease and other diseases of the circulatory system: Secondary | ICD-10-CM

## 2021-04-26 DIAGNOSIS — Z833 Family history of diabetes mellitus: Secondary | ICD-10-CM

## 2021-04-26 LAB — LIPID PANEL
Cholesterol: 169 mg/dL (ref 0–200)
HDL: 51 mg/dL (ref >40)
LDL Cholesterol: 95 mg/dL (ref 0–99)
Total CHOL/HDL Ratio: 3.3 RATIO
Triglycerides: 113 mg/dL (ref <150)
VLDL: 23 mg/dL (ref 0–40)

## 2021-04-26 LAB — CBC
HCT: 34.1 % — ABNORMAL LOW (ref 36.0–46.0)
Hemoglobin: 10.3 g/dL — ABNORMAL LOW (ref 12.0–15.0)
MCH: 24.4 pg — ABNORMAL LOW (ref 26.0–34.0)
MCHC: 30.2 g/dL (ref 30.0–36.0)
MCV: 80.8 fL (ref 80.0–100.0)
Platelets: 280 10*3/uL (ref 150–400)
RBC: 4.22 MIL/uL (ref 3.87–5.11)
RDW: 19.9 % — ABNORMAL HIGH (ref 11.5–15.5)
WBC: 11.8 10*3/uL — ABNORMAL HIGH (ref 4.0–10.5)
nRBC: 0 % (ref 0.0–0.2)

## 2021-04-26 LAB — HEMOGLOBIN A1C
Hgb A1c MFr Bld: 8.3 % — ABNORMAL HIGH (ref 4.8–5.6)
Mean Plasma Glucose: 191.51 mg/dL

## 2021-04-26 LAB — HEPATIC FUNCTION PANEL
ALT: 37 U/L (ref 0–44)
AST: 34 U/L (ref 15–41)
Albumin: 2.9 g/dL — ABNORMAL LOW (ref 3.5–5.0)
Alkaline Phosphatase: 130 U/L — ABNORMAL HIGH (ref 38–126)
Bilirubin, Direct: 0.4 mg/dL — ABNORMAL HIGH (ref 0.0–0.2)
Indirect Bilirubin: 0.8 mg/dL (ref 0.3–0.9)
Total Bilirubin: 1.2 mg/dL (ref 0.3–1.2)
Total Protein: 6.8 g/dL (ref 6.5–8.1)

## 2021-04-26 LAB — BASIC METABOLIC PANEL
Anion gap: 13 (ref 5–15)
BUN: 14 mg/dL (ref 8–23)
CO2: 23 mmol/L (ref 22–32)
Calcium: 8.9 mg/dL (ref 8.9–10.3)
Chloride: 99 mmol/L (ref 98–111)
Creatinine, Ser: 0.76 mg/dL (ref 0.44–1.00)
GFR, Estimated: >60 mL/min (ref >60)
Glucose, Bld: 285 mg/dL — ABNORMAL HIGH (ref 70–99)
Potassium: 3.5 mmol/L (ref 3.5–5.1)
Sodium: 135 mmol/L (ref 135–145)

## 2021-04-26 LAB — RESP PANEL BY RT-PCR (FLU A&B, COVID) ARPGX2
Influenza A by PCR: NEGATIVE
Influenza B by PCR: NEGATIVE
SARS Coronavirus 2 by RT PCR: NEGATIVE

## 2021-04-26 LAB — GLUCOSE, CAPILLARY
Glucose-Capillary: 280 mg/dL — ABNORMAL HIGH (ref 70–99)
Glucose-Capillary: 162 mg/dL — ABNORMAL HIGH (ref 70–99)

## 2021-04-26 LAB — ECHOCARDIOGRAM COMPLETE
Area-P 1/2: 4.49 cm2
Height: 65 in
MV VTI: 1.52 cm2
S' Lateral: 2.7 cm
Weight: 3040 oz

## 2021-04-26 LAB — TROPONIN I (HIGH SENSITIVITY)
Troponin I (High Sensitivity): 29 ng/L — ABNORMAL HIGH (ref <18)
Troponin I (High Sensitivity): 31 ng/L — ABNORMAL HIGH (ref <18)

## 2021-04-26 LAB — BRAIN NATRIURETIC PEPTIDE: B Natriuretic Peptide: 663.8 pg/mL — ABNORMAL HIGH (ref 0.0–100.0)

## 2021-04-26 MED ORDER — HYDRALAZINE HCL 20 MG/ML IJ SOLN: 5.0000 mg | INTRAMUSCULAR | Status: DC | PRN

## 2021-04-26 MED ORDER — DOCUSATE SODIUM 100 MG PO CAPS
100.0000 mg | ORAL_CAPSULE | Freq: Two times a day (BID) | ORAL | Status: DC
  Administered 2021-04-26 – 2021-04-29 (×3): 100 mg via ORAL
  Filled 2021-04-26 (×7): qty 1

## 2021-04-26 MED ORDER — OXYCODONE HCL 5 MG PO TABS
5.0000 mg | ORAL_TABLET | ORAL | Status: DC | PRN
  Filled 2021-04-26: qty 1

## 2021-04-26 MED ORDER — ACETAMINOPHEN 650 MG RE SUPP: 650.0000 mg | Freq: Four times a day (QID) | RECTAL | Status: DC | PRN

## 2021-04-26 MED ORDER — INSULIN ASPART 100 UNIT/ML IJ SOLN
0.0000 [IU] | Freq: Three times a day (TID) | INTRAMUSCULAR | Status: DC
  Administered 2021-04-27: 8 [IU] via SUBCUTANEOUS
  Administered 2021-04-27: 5 [IU] via SUBCUTANEOUS
  Administered 2021-04-27: 2 [IU] via SUBCUTANEOUS
  Administered 2021-04-28 (×2): 3 [IU] via SUBCUTANEOUS
  Administered 2021-04-28: 5 [IU] via SUBCUTANEOUS

## 2021-04-26 MED ORDER — ALPRAZOLAM 0.5 MG PO TABS
0.5000 mg | ORAL_TABLET | Freq: Every day | ORAL | Status: DC
  Administered 2021-04-26 – 2021-04-28 (×3): 0.5 mg via ORAL
  Filled 2021-04-26 (×4): qty 1

## 2021-04-26 MED ORDER — NITROGLYCERIN 0.4 MG SL SUBL
0.4000 mg | SUBLINGUAL_TABLET | SUBLINGUAL | Status: DC | PRN
Start: 2021-04-26 — End: 2021-04-26

## 2021-04-26 MED ORDER — IRBESARTAN 300 MG PO TABS
300.0000 mg | ORAL_TABLET | Freq: Every day | ORAL | Status: DC
  Administered 2021-04-26 – 2021-04-29 (×4): 300 mg via ORAL
  Filled 2021-04-26 (×4): qty 1

## 2021-04-26 MED ORDER — FUROSEMIDE 10 MG/ML IJ SOLN
40.0000 mg | Freq: Once | INTRAMUSCULAR | Status: AC
  Administered 2021-04-26: 40 mg via INTRAVENOUS
  Filled 2021-04-26: qty 4

## 2021-04-26 MED ORDER — APIXABAN 5 MG PO TABS
5.0000 mg | ORAL_TABLET | Freq: Two times a day (BID) | ORAL | Status: DC
  Administered 2021-04-26 – 2021-04-29 (×7): 5 mg via ORAL
  Filled 2021-04-26 (×7): qty 1

## 2021-04-26 MED ORDER — ACETAMINOPHEN 325 MG PO TABS
650.0000 mg | ORAL_TABLET | Freq: Four times a day (QID) | ORAL | Status: DC | PRN
  Administered 2021-04-27 – 2021-04-28 (×2): 650 mg via ORAL
  Filled 2021-04-26 (×2): qty 2

## 2021-04-26 MED ORDER — ZOLPIDEM TARTRATE 5 MG PO TABS: 5.0000 mg | ORAL_TABLET | Freq: Every evening | ORAL | Status: DC | PRN

## 2021-04-26 MED ORDER — SPIRONOLACTONE 12.5 MG HALF TABLET
12.5000 mg | ORAL_TABLET | Freq: Every day | ORAL | Status: DC
  Administered 2021-04-26 – 2021-04-29 (×4): 12.5 mg via ORAL
  Filled 2021-04-26 (×4): qty 1

## 2021-04-26 MED ORDER — FUROSEMIDE 10 MG/ML IJ SOLN
40.0000 mg | Freq: Two times a day (BID) | INTRAMUSCULAR | Status: DC
  Administered 2021-04-26 – 2021-04-29 (×6): 40 mg via INTRAVENOUS
  Filled 2021-04-26 (×6): qty 4

## 2021-04-26 MED ORDER — INSULIN ASPART 100 UNIT/ML IJ SOLN
0.0000 [IU] | Freq: Three times a day (TID) | INTRAMUSCULAR | Status: DC
  Administered 2021-04-26: 8 [IU] via SUBCUTANEOUS

## 2021-04-26 MED ORDER — INSULIN ASPART 100 UNIT/ML IJ SOLN
0.0000 [IU] | Freq: Every day | INTRAMUSCULAR | Status: DC
  Administered 2021-04-27: 2 [IU] via SUBCUTANEOUS

## 2021-04-26 MED ORDER — ASPIRIN 81 MG PO CHEW: 324.0000 mg | CHEWABLE_TABLET | Freq: Once | ORAL | Status: DC

## 2021-04-26 MED ORDER — METOPROLOL SUCCINATE ER 100 MG PO TB24
100.0000 mg | ORAL_TABLET | Freq: Two times a day (BID) | ORAL | Status: DC
  Administered 2021-04-26 – 2021-04-29 (×6): 100 mg via ORAL
  Filled 2021-04-26 (×6): qty 1

## 2021-04-26 MED ORDER — PANTOPRAZOLE SODIUM 40 MG PO TBEC
40.0000 mg | DELAYED_RELEASE_TABLET | Freq: Every day | ORAL | Status: DC
  Administered 2021-04-26 – 2021-04-29 (×4): 40 mg via ORAL
  Filled 2021-04-26 (×4): qty 1

## 2021-04-26 MED ORDER — ONDANSETRON HCL 4 MG/2ML IJ SOLN: 4.0000 mg | Freq: Four times a day (QID) | INTRAMUSCULAR | Status: DC | PRN

## 2021-04-26 MED ORDER — VENLAFAXINE HCL ER 75 MG PO CP24
150.0000 mg | ORAL_CAPSULE | Freq: Two times a day (BID) | ORAL | Status: DC
  Administered 2021-04-26 – 2021-04-29 (×7): 150 mg via ORAL
  Filled 2021-04-26 (×3): qty 2
  Filled 2021-04-26: qty 1
  Filled 2021-04-26: qty 2
  Filled 2021-04-26: qty 1
  Filled 2021-04-26 (×2): qty 2

## 2021-04-26 MED ORDER — INSULIN GLARGINE-YFGN 100 UNIT/ML ~~LOC~~ SOLN
25.0000 [IU] | Freq: Every day | SUBCUTANEOUS | Status: DC
  Administered 2021-04-26 – 2021-04-28 (×3): 25 [IU] via SUBCUTANEOUS
  Filled 2021-04-26 (×3): qty 0.25

## 2021-04-26 MED ORDER — TRAZODONE HCL 50 MG PO TABS: 25.0000 mg | ORAL_TABLET | Freq: Every evening | ORAL | Status: DC | PRN

## 2021-04-26 MED ORDER — BISACODYL 5 MG PO TBEC: 5.0000 mg | DELAYED_RELEASE_TABLET | Freq: Every day | ORAL | Status: DC | PRN

## 2021-04-26 MED ORDER — PREGABALIN 25 MG PO CAPS
50.0000 mg | ORAL_CAPSULE | Freq: Two times a day (BID) | ORAL | Status: DC
  Administered 2021-04-26 – 2021-04-29 (×7): 50 mg via ORAL
  Filled 2021-04-26 (×7): qty 2

## 2021-04-26 MED ORDER — SODIUM CHLORIDE 0.9% FLUSH
3.0000 mL | Freq: Two times a day (BID) | INTRAVENOUS | Status: DC
  Administered 2021-04-26 – 2021-04-29 (×7): 3 mL via INTRAVENOUS

## 2021-04-26 MED ORDER — ONDANSETRON HCL 4 MG PO TABS: 4.0000 mg | ORAL_TABLET | Freq: Four times a day (QID) | ORAL | Status: DC | PRN

## 2021-04-26 MED ORDER — POLYETHYLENE GLYCOL 3350 17 G PO PACK: 17.0000 g | PACK | Freq: Every day | ORAL | Status: DC | PRN

## 2021-04-26 NOTE — ED Notes (Signed)
Pt sitting up in bed watching tv with family member at bedside. Denies any chest pain. No needs requested at this time. Repeat troponin drawn and sent to lab. Call bell in reach

## 2021-04-26 NOTE — Progress Notes (Signed)
SATURATION QUALIFICATIONS: (This note is used to comply with regulatory documentation for home oxygen)  Patient Saturations on Room Air at Rest = 91%  Patient Saturations on Room Air while Ambulating = 80%  Patient Saturations on 2 Liters of oxygen while Ambulating = 90%  Please briefly explain why patient needs home oxygen: Pt's sats decrease while at rest and when ambulating without supplemental O2.   Moishe Spice, PT, DPT Acute Rehabilitation Services  Pager: [phone] Office: [phone]

## 2021-04-26 NOTE — ED Notes (Signed)
Called lab they said they can add on labs

## 2021-04-26 NOTE — H&P (Addendum)
History and Physical    Alexis [name] [id number] DOB: 02-18-1951 DOA: 04/26/2021  PCP: Deland Pretty, MD Consultants:  Jannifer Franklin - neurology; Hilty - cardiology; Avernini - endocrinology Patient coming from:  Home - lives with husband, daughter and grandson; NOK: [place], [place], [phone]  Chief Complaint: CP  HPI: Alexis [name] is a 70 y.o. female with medical history significant of chronic back pain; DM; fibromyalgia; HTN; HLD; IBS; OSA; obesity; and remote uterine cancer presenting with CP.  She reports that she started not feeling too well Sunday, just no energy and like something was sitting on her chest.  She had broken her hip in May and is still in therapy; she went for PT on Monday and reported feeling exhausted. She felt even worse on Tuesday, like something was in her throat "like a mouse or something"; she was hearing little squeaks when she breathed.  She took Claritin without relief.  She even took a COVID test on Tuesday.  She was getting worse and worse.  Her husband checked her BP last night and it was 165-179/60s.  She was up every hour overnight.  Bending over to pick something up made her so SOB.  The more she moved, the worse she felt.  And she still felt i someone was sitting on her chest.  She had been scheduled for an appointment with cardiology on Tuesday and canceled it due to feeling poorly.  +orthopnea.  Intermittent cough, mild. +LE edema, mostly in the afternoons or if she up a lot.    ED Course: Acute new CHF.  "Off" since Monday - fatigued without specific symptoms.  Last night with CP, SOB, orthopnea.  CXR with edema, BNP elevated.  Giving Lasix.  Review of Systems: As per HPI; otherwise review of systems reviewed and negative.   Ambulatory Status:  Ambulates without assistance  COVID Vaccine Status:   Complete  Past Medical History:  Diagnosis Date   Anxiety    Arthritis    "knees" (05/25/2014)   Chronic back pain    Depression     Diabetes mellitus type II    Diabetic peripheral neuropathy ([place]) 10/20/2018   Diverticulitis    Fatty liver    Fibromyalgia    Gastric polyp    hyperplastic   Gastroparesis    "recently dx'd" (05/25/2014)   GERD (gastroesophageal reflux disease)    H/O hiatal hernia    Hx of gastritis    Hyperlipidemia    Hypertension    Irritable bowel syndrome (IBS)    Memory difficulties 09/18/2017   Migraine without aura, without mention of intractable migraine without mention of status migrainosus    "related to allergies; have them in the spring and fall" (05/25/2014)   Obesity    Sleep apnea    "wore mask; took it off in my sleep; quit wearing it" (05/25/2014)   Uterine cancer ([place]) dx'd 2000   surg only   Vasculitis ([place])    "irritates my legs"   Wears glasses     Past Surgical History:  Procedure Laterality Date   ANTERIOR CERVICAL DECOMPRESSION/DISCECTOMY FUSION 4 LEVELS N/A 05/07/2018   Procedure: ANTERIOR CERVICAL DECOMPRESSION FUSION, CERVICAL 4-5,CERVICAL 5-6, CERVICAL 6-7 WITH INSTRUMENTATION AND ALLOGRAFT;  Surgeon: Phylliss Bob, MD;  Location: [place];  Service: Orthopedics;  Laterality: N/A;   APPENDECTOMY  ~ [place]   LEFT HEART CATHETERIZATION WITH CORONARY ANGIOGRAM N/A  05/26/2014   Procedure: LEFT HEART CATHETERIZATION WITH CORONARY ANGIOGRAM;  Surgeon: Troy Sine, MD;  Location: [facility] CATH LAB;  Service: Cardiovascular;  Laterality: N/A;   SINUS SURGERY WITH INSTATRAK  2000   TOTAL HIP ARTHROPLASTY Left 12/05/2020   Procedure: TOTAL HIP ARTHROPLASTY ANTERIOR APPROACH;  Surgeon: Rod Can, MD;  Location: [place];  Service: Orthopedics;  Laterality: Left;   TUBAL LIGATION  ~ 1982   VAGINAL HYSTERECTOMY  2000    Social History   Socioeconomic History   Marital status: Married    Spouse name: Not on file   Number of children: 2   Years of education: Not on file   Highest  education level: Not on file  Occupational History   Occupation: Retired    Fish farm manager: RETIRED  Tobacco Use   Smoking status: Never   Smokeless tobacco: Never  Vaping Use   Vaping Use: Never used  Substance and Sexual Activity   Alcohol use: No   Drug use: No   Sexual activity: Never  Other Topics Concern   Not on file  Social History Narrative   Daily Caffeine, Coke   Social Determinants of Health   Financial Resource Strain: Not on file  Food Insecurity: Not on file  Transportation Needs: Not on file  Physical Activity: Not on file  Stress: Not on file  Social Connections: Not on file  Intimate Partner Violence: Not on file    Allergies  Allergen Reactions   Aspirin Other (See Comments)    Reaction: Vasculitis per MD   Codeine Itching and Rash   Naproxen Nausea Only    Reaction: Makes stomach upset/irritated.   Sulfonamide Derivatives Itching and Rash    Family History  Problem Relation Age of Onset   Breast cancer Sister    Colon polyps Father    Heart disease Father    Colon cancer Maternal Uncle    Ovarian cancer Maternal Aunt    Stomach cancer Maternal Aunt    Diabetes Maternal Aunt    Heart disease Maternal Uncle    Heart disease Other        Grandparents   Irritable bowel syndrome Daughter     Prior to Admission medications   Medication Sig Start Date End Date Taking? Authorizing Provider  acetaminophen (TYLENOL) 325 MG tablet Take 2 tablets (650 mg total) by mouth every 6 (six) hours as needed for mild pain or moderate pain. 12/12/20   Nolberto Hanlon, MD  ALPRAZolam Duanne Moron) 0.5 MG tablet Take 0.5-1 mg by mouth at bedtime as needed for anxiety.     [provider]  amiodarone (PACERONE) 200 MG tablet Take 1 tablet (200 mg total) by mouth 2 (two) times daily. 12/12/20   Nolberto Hanlon, MD  amLODipine (NORVASC) 5 MG tablet Take 5 mg by mouth daily.    [provider]  amLODipine (NORVASC) 5 MG tablet amlodipine 5 mg tablet  TAKE 1 TABLET BY  MOUTH EVERY DAY    [provider]  apixaban (ELIQUIS) 5 MG TABS tablet Take 1 tablet (5 mg total) by mouth 2 (two) times daily. 12/12/20   Nolberto Hanlon, MD  b complex vitamins tablet Take 1 tablet by mouth at bedtime.    [provider]  FARXIGA 10 MG TABS tablet Take 10 mg by mouth daily. 09/10/19   [provider]  fish oil-omega-3 fatty acids 1000 MG capsule Take 1 g by mouth at bedtime.     [provider]  metFORMIN (GLUCOPHAGE-XR)  500 MG 24 hr tablet Take 500 mg by mouth daily. 08/24/19   [provider]  metoprolol succinate (TOPROL-XL) 50 MG 24 hr tablet Take 2 tablets (100 mg total) by mouth in the morning and at bedtime. Take with or immediately following a meal. 01/29/21   Almyra Deforest, PA  pregabalin (LYRICA) 50 MG capsule Take 50 mg by mouth 2 (two) times daily.    [provider]  RABEprazole (ACIPHEX) 20 MG tablet Take 1 tablet (20 mg total) by mouth 2 (two) times daily. 04/22/12   Lafayette Dragon, MD  tamsulosin (FLOMAX) 0.4 MG CAPS capsule Take 0.4 mg by mouth as needed. 07/17/16   [provider]  TRESIBA FLEXTOUCH 100 UNIT/ML FlexTouch Pen Inject 25 Units into the skin daily. 09/29/19   [provider]  valsartan (DIOVAN) 320 MG tablet Take 320 mg by mouth daily.    [provider]  venlafaxine XR (EFFEXOR-XR) 150 MG 24 hr capsule Take 150 mg by mouth 2 (two) times daily.    [provider]  zaleplon (SONATA) 5 MG capsule Take 5 mg by mouth at bedtime as needed for sleep.    [provider]    Physical Exam: Vitals:   04/26/21 1016 04/26/21 1030 04/26/21 1123 04/26/21 1200  BP:  (!) 162/66 (!) 160/67 (!) 157/66  Pulse:  71 70 69  Resp:  (!) '22 20 18  '$ Temp:   99.5 F (37.5 C)   TempSrc:   Oral   SpO2:  97%  95%  Weight: 86.2 kg     Height: '5\' 5"'$  (1.651 m)        General:  Appears calm and comfortable and is in NAD, very conversant Eyes:  PERRL, EOMI, normal lids, iris ENT:   grossly normal hearing, lips & tongue, mmm; some absent dentition Neck:  no LAD, masses or thyromegaly Cardiovascular:  RRR, no m/r/g. Tr LE edema.  Respiratory:   R > L bibasilar crackles.  Normal respiratory effort on 4L [place] O2. Abdomen:  soft, NT, ND Skin:  no rash or induration seen on limited exam Musculoskeletal:  grossly normal tone BUE/BLE, good ROM, no bony abnormality Psychiatric:  grossly normal mood and affect, speech fluent and appropriate, AOx3 Neurologic:  CN 2-12 grossly intact, moves all extremities in coordinated fashion    Radiological Exams on Admission: Independently reviewed - see discussion in A/P where applicable  DG Chest 2 View  Result Date: 04/26/2021 CLINICAL DATA:  Chest pain and shortness of breath. EXAM: CHEST - 2 VIEW COMPARISON:  01/29/2021 FINDINGS: The cardio pericardial silhouette is enlarged. There is pulmonary vascular congestion without overt pulmonary edema. Bibasilar atelectasis/infiltrate associated with small bilateral pleural effusions. Bones are diffusely demineralized. IMPRESSION: Enlargement of the cardiopericardial silhouette with pulmonary vascular congestion. Bibasilar atelectasis/infiltrate and small bilateral pleural effusions. Electronically Signed   By: Misty Stanley M.D.   On: 04/26/2021 09:20    EKG: Independently reviewed.  NSR with rate 71; LAFB; no evidence of acute ischemia   Labs on Admission: I have personally reviewed the available labs and imaging studies at the time of the admission.  Pertinent labs:   Glucose 285 Albumin 2.9 BNP 663.8; 530.1 on 4/30 HS troponin 29, 31 WBC 11.8 Hgb 10.3   Assessment/Plan Principal Problem:   New onset of congestive heart failure (HCC) Active Problems:   DM (diabetes mellitus), type 2, uncontrolled (HCC)   Hyperlipidemia   Anxiety and depression   Essential hypertension   Obesity (BMI 30-39.9)  New onset CHF -Patient without known h/o CHF (normal echo in 11/2020) presenting  with worsening SOB and hypoxia -CXR consistent with mild pulmonary edema -Elevated BNP compared to prior -With elevated BNP and abnl CXR, acute decompensated CHF seems probable as diagnosis -Will admit, as per the Emergency HF Mortality Risk Grade.  The patient has: severe pulmonary edema requiring new/increased O2 therapy. -Will request repeat echocardiogram -ACE/ARB, beta blocker, and spironolactone are recommended as per guideline-directed medical therapy to reduce morbidity/mortality; will add low-dose spironolactone -CHF order set utilized; may need CHF team consult but will hold until Echo results are available -Was given Lasix 40 mg x 1 in ER and will repeat with 40 mg IV BID -Continue [place] O2 for now -Normal kidney function at this time, will follow -Repeat EKG in AM -Mildly elevated HS troponin is likely related to demand ischemia; doubt ACS based on symptoms  HTN -Hold Norvasc in the setting of new apparent CHF -Continue Toprol XL and Diovan -Will also add prn hydralazine  HLD -Check lipids -Hold fish oil as inpatient due to limited inpatient utility  DM -Last A1c was 510.3 on 4/25; will repeat -Hold Farxiga, Glucophage -Continue Tresiba -Will cover with moderate-scale SSI for now  Afib -Rate controlled with metoprolol -Continue Eliquis  Depression/IBS/Fibromyalgia -Continue Effexor, Xanax, Sonata, Lyrica   Obesity -Body mass index is 31.62 kg/m..  -Weight loss should be encouraged -Outpatient PCP/bariatric medicine/bariatric surgery f/u encouraged      Note: This patient has been tested and is negative for the novel coronavirus COVID-19. She has been fully vaccinated against COVID-19.   DVT prophylaxis: Eliquis Code Status:  Full - confirmed with patient Family Communication: None present Disposition Plan:  The patient is from: home  Anticipated d/c is to: home, possibly with [facility] services  Anticipated d/c date will depend on clinical response to  treatment, likely 2-4 days  Patient is currently: acutely ill Consults called: TOC team; PT/OT/Nutrition Admission status: Admit - It is my clinical opinion that admission to INPATIENT is reasonable and necessary because this patient will require at least 2 midnights in the hospital to treat this condition based on the medical complexity of the problems presented.  Given the aforementioned information, the predictability of an adverse outcome is felt to be significant.    Karmen Bongo MD [facility]   How to contact the [facility] Attending or Consulting provider [place] or covering provider during after hours [place], for this patient?  Check the care team in [facility] and look for a) attending/consulting [facility] provider listed and b) the [facility] team listed Log into [email or web address] and use [place]'s universal password to access. If you do not have the password, please contact the hospital operator. Locate the [facility] provider you are looking for under [facility] and page to a number that you can be directly reached. If you still have difficulty reaching the provider, please page the [facility] (Director on Call) for the Hospitalists listed on amion for assistance.   04/26/2021, 1:53 PM

## 2021-04-26 NOTE — Evaluation (Signed)
Physical Therapy Evaluation Patient Details Name: Alexis [name] MRN: [id number] DOB: 06-May-1951 Today's Date: 04/26/2021  History of Present Illness  Pt is a 70 y.o. female who presented 04/26/21 with chest pain and SOB. Pt admitted with acute new CHF. CXR consistent with mild pulmonary edema. PMH: chronic back pain; DM; fibromyalgia; HTN; HLD; IBS; OSA; obesity; and remote uterine cancer   Clinical Impression  Pt presents with condition above and deficits mentioned below, see PT Problem List. Pt has been going to Outpatient PT recently. PTA, she was independent without AD for mobility, living with her family in a 1-level house with 6 STE. Currently, pt displays some mild balance deficits and decreased aerobic endurance. She reports that she is still experiencing L lower extremity weakness s/p her L hip fx and resulting THA in April 2022 that limits her capabilities with stair negotiation. However, did not observe pt on stairs this date and strength otherwise tested grossly and appeared grossly [facility] for tasks performed this date. Pt's sats did decrease from >/= 91% on RA at rest to 80% on RA while ambulating, needing 2L to rebound to high 80s-low 90s. Began education on CHF, including increasing activity frequency, limiting fluid and sodium intake, and weighing self regularly. Recommending pt continue to follow-up with Outpatient PT. Will continue to follow acutely.     Recommendations for follow up therapy are one component of a multi-disciplinary discharge planning process, led by the attending physician.  Recommendations may be updated based on patient status, additional functional criteria and insurance authorization.  Follow Up Recommendations Outpatient PT (continue with current OPPT)    Equipment Recommendations  None recommended by PT    Recommendations for Other Services       Precautions / Restrictions Precautions Precautions: Fall Precaution Comments: monitor  SpO2 Restrictions Weight Bearing Restrictions: No      Mobility  Bed Mobility Overal bed mobility: Modified Independent             General bed mobility comments: Pt able to perform bed mobility safely without assistance.    Transfers Overall transfer level: Needs assistance Equipment used: None Transfers: Sit to/from Stand Sit to Stand: Supervision         General transfer comment: Supervision for safety, no LOB.  Ambulation/Gait Ambulation/Gait assistance: Min guard Gait Distance (Feet): 150 Feet Assistive device: None Gait Pattern/deviations: Step-through pattern;Decreased stride length Gait velocity: reduced Gait velocity interpretation: 1.31 - 2.62 ft/sec, indicative of limited community ambulator General Gait Details: Pt ambulates at a slower speed, but fairly steady, no LOB. x1 standing rest break to rebound sats as pt's sats decreased to 80% on RA, needing 2L to rebound to high 80s-low 90s  Stairs            Wheelchair Mobility    Modified Rankin (Stroke Patients Only)       Balance Overall balance assessment: Mild deficits observed, not formally tested                                           Pertinent Vitals/Pain Pain Assessment: No/denies pain    Home Living Family/patient expects to be discharged to:: Private residence Living Arrangements: Spouse/significant other;Children Available Help at Discharge: Family;Available 24 hours/day Type of Home: House Home Access: Stairs to enter Entrance Stairs-Rails: Can reach both Entrance Stairs-Number of Steps: 6 Home Layout: One level Home Equipment: Walker - 2 wheels;Grab  bars - toilet;Toilet riser;Cane - single point;Shower seat;Wheelchair - manual      Prior Function Level of Independence: Independent         Comments: Does not use AD for mobility.     Hand Dominance   Dominant Hand: Right    Extremity/Trunk Assessment   Upper Extremity Assessment Upper  Extremity Assessment: Defer to OT evaluation    Lower Extremity Assessment Lower Extremity Assessment: LLE deficits/detail (MMT scores of 4+ to 5 grossly) LLE Deficits / Details: hx of L hip fx with resulting anterior approach THA in April 2022, pt reports difficulty ascending stairs leading up with L, but did not observe this date    Cervical / Trunk Assessment Cervical / Trunk Assessment: Normal  Communication   Communication: No difficulties  Cognition Arousal/Alertness: Awake/alert Behavior During Therapy: WFL for tasks assessed/performed Overall Cognitive Status: Within Functional Limits for tasks assessed                                        General Comments General comments (skin integrity, edema, etc.): >/= 91% SpO2 on RA at rest, pt's sats decreased to 80% on RA while ambulating, needing 2L to rebound to high 80s-low 90s; educated pt on increasing frequency of activity and limiting fluid intake and sodium    Exercises     Assessment/Plan    PT Assessment Patient needs continued PT services  PT Problem List Decreased strength;Decreased activity tolerance;Decreased balance;Decreased mobility;Cardiopulmonary status limiting activity;Obesity       PT Treatment Interventions DME instruction;Gait training;Stair training;Functional mobility training;Therapeutic activities;Therapeutic exercise;Balance training;Neuromuscular re-education;Patient/family education    PT Goals (Current goals can be found in the Care Plan section)  Acute Rehab PT Goals Patient Stated Goal: to get better and go back to OPPT PT Goal Formulation: With patient/family Time For Goal Achievement: 05/10/21 Potential to Achieve Goals: Good    Frequency Min 3X/week   Barriers to discharge        Co-evaluation               AM-PAC PT "6 Clicks" Mobility  Outcome Measure Help needed turning from your back to your side while in a flat bed without using bedrails?: None Help  needed moving from lying on your back to sitting on the side of a flat bed without using bedrails?: None Help needed moving to and from a bed to a chair (including a wheelchair)?: A Little Help needed standing up from a chair using your arms (e.g., wheelchair or bedside chair)?: A Little Help needed to walk in hospital room?: A Little Help needed climbing 3-5 steps with a railing? : A Little 6 Click Score: 20    End of Session Equipment Utilized During Treatment: Gait belt;Oxygen Activity Tolerance: Patient tolerated treatment well Patient left: in chair;with call bell/phone within reach;with family/visitor present Nurse Communication: Mobility status;Other (comment) (sats) PT Visit Diagnosis: Unsteadiness on feet (R26.81);Muscle weakness (generalized) (M62.81);Difficulty in walking, not elsewhere classified (R26.2);Other abnormalities of gait and mobility (R26.89)    Time: 1657-1730 PT Time Calculation (min) (ACUTE ONLY): 33 min   Charges:   PT Evaluation $PT Eval Moderate Complexity: 1 Mod PT Treatments $Therapeutic Activity: 8-22 mins        Moishe Spice, PT, DPT Acute Rehabilitation Services  Pager: [phone] Office: [phone]   Orvan Falconer 04/26/2021, 5:47 PM

## 2021-04-26 NOTE — ED Triage Notes (Signed)
Pt states her BP has been running high over the past few days [id number] systolic. Pt also states she feels a heaviness in her chest, generalized weakness, nausea, feeling bloated.   Pt's room air sats 77% in triage- pt does not wear O2 at home.

## 2021-04-26 NOTE — ED Provider Notes (Signed)
[facility] EMERGENCY DEPARTMENT Provider Note   CSN: [id number] Arrival date & time: 04/26/21  0840     History Chief Complaint  Patient presents with   Chest Pain    Alexis [name] is a 70 y.o. female.  HPI 70 year old female presents with shortness of breath and chest pressure.  She has been feeling fatigued for about 3 days.  Took a COVID test that was negative at home.  No significant cough, rhinorrhea, sore throat.  However since last night she has been having chest pressure and dyspnea, especially with movements.  She was up all night as whenever she laid flat she would get short of breath after an hour or so.  Pressure is moderate right now.  No leg swelling but her abdomen feels bloated despite having normal bowel movements.  No known history of CHF.  She was found to have O2 sats in the 70s on arrival to the emergency department.  Past Medical History:  Diagnosis Date   Anxiety    Arthritis    "knees" (05/25/2014)   Chronic back pain    Depression    Diabetes mellitus type II    Diabetic peripheral neuropathy ([place]) 10/20/2018   Diverticulitis    Fatty liver    Fibromyalgia    Gastric polyp    hyperplastic   Gastroparesis    "recently dx'd" (05/25/2014)   GERD (gastroesophageal reflux disease)    H/O hiatal hernia    Hx of gastritis    Hyperlipidemia    Hypertension    Irritable bowel syndrome (IBS)    Memory difficulties 09/18/2017   Migraine without aura, without mention of intractable migraine without mention of status migrainosus    "related to allergies; have them in the spring and fall" (05/25/2014)   Obesity    Sleep apnea    "wore mask; took it off in my sleep; quit wearing it" (05/25/2014)   Uterine cancer ([place]) dx'd 2000   surg only   Vasculitis ([place])    "irritates my legs"   Wears glasses     Patient Active Problem List   Diagnosis Date Noted   New onset of congestive heart failure ([place]) 04/26/2021   Hypotension  [id number]   Acute metabolic encephalopathy [id number]   Paroxysmal atrial fibrillation ([place])    Acute blood loss anemia    Subcapital fracture of left femur ([place]) 12/04/2020   Leukocytosis 12/04/2020   Fall at home, initial encounter 12/04/2020   Diabetic peripheral neuropathy ([place]) 10/20/2018   Cervical myelopathy ([place]) 05/07/2018   Type 2 diabetes mellitus ([place]) 02/18/2018   Memory difficulties 09/18/2017   Coronary artery calcification seen on computed tomography 04/09/2017   Word finding difficulty 09/05/2016   Cervicogenic headache 09/05/2016   Palpitation 08/22/2016   Bilateral leg edema 08/22/2016   Obesity (BMI 30-39.9) 05/25/2014   Precordial chest pain 05/25/2014   Migraine without aura, without mention of intractable migraine without mention of status migrainosus 03/18/2013   Chest pain 01/29/2013   Constipation 02/24/2012   DM (diabetes mellitus), type 2, uncontrolled ([place]) 08/24/2010   Hyperlipidemia 08/24/2010   ANXIETY 08/24/2010   Anxiety and depression 08/24/2010   Essential hypertension 08/24/2010   GERD 08/24/2010   IRRITABLE BOWEL SYNDROME 08/24/2010   FATTY LIVER DISEASE 08/24/2010   ARTHRITIS 08/24/2010   Fibromyalgia 08/24/2010   OSA (obstructive sleep apnea) 08/24/2010   ABDOMINAL PAIN-RUQ 08/24/2010   UTERINE CANCER, HX OF 08/24/2010   GASTRITIS, HX OF 08/24/2010    Past Surgical  History:  Procedure Laterality Date   ANTERIOR CERVICAL DECOMPRESSION/DISCECTOMY FUSION 4 LEVELS N/A 05/07/2018   Procedure: ANTERIOR CERVICAL DECOMPRESSION FUSION, CERVICAL 4-5,CERVICAL 5-6, CERVICAL 6-7 WITH INSTRUMENTATION AND ALLOGRAFT;  Surgeon: Phylliss Bob, MD;  Location: [place];  Service: Orthopedics;  Laterality: N/A;   APPENDECTOMY  ~ [place]   LEFT HEART CATHETERIZATION WITH CORONARY ANGIOGRAM N/A 05/26/2014   Procedure: LEFT HEART CATHETERIZATION WITH CORONARY ANGIOGRAM;   Surgeon: Troy Sine, MD;  Location: [facility] CATH LAB;  Service: Cardiovascular;  Laterality: N/A;   SINUS SURGERY WITH INSTATRAK  2000   TOTAL HIP ARTHROPLASTY Left 12/05/2020   Procedure: TOTAL HIP ARTHROPLASTY ANTERIOR APPROACH;  Surgeon: Rod Can, MD;  Location: [place];  Service: Orthopedics;  Laterality: Left;   TUBAL LIGATION  ~ 1982   VAGINAL HYSTERECTOMY  2000     OB History     Gravida      Para      Term      Preterm      AB      Living  2      SAB      IAB      Ectopic      Multiple      Live Births              Family History  Problem Relation Age of Onset   Breast cancer Sister    Colon polyps Father    Heart disease Father    Colon cancer Maternal Uncle    Ovarian cancer Maternal Aunt    Stomach cancer Maternal Aunt    Diabetes Maternal Aunt    Heart disease Maternal Uncle    Heart disease Other        Grandparents   Irritable bowel syndrome Daughter     Social History   Tobacco Use   Smoking status: Never   Smokeless tobacco: Never  Vaping Use   Vaping Use: Never used  Substance Use Topics   Alcohol use: No   Drug use: No    Home Medications Prior to Admission medications   Medication Sig Start Date End Date Taking? Authorizing Provider  acetaminophen (TYLENOL) 325 MG tablet Take 2 tablets (650 mg total) by mouth every 6 (six) hours as needed for mild pain or moderate pain. 12/12/20   Nolberto Hanlon, MD  ALPRAZolam Duanne Moron) 0.5 MG tablet Take 0.5-1 mg by mouth at bedtime as needed for anxiety.     [provider]  amiodarone (PACERONE) 200 MG tablet Take 1 tablet (200 mg total) by mouth 2 (two) times daily. 12/12/20   Nolberto Hanlon, MD  amLODipine (NORVASC) 5 MG tablet Take 5 mg by mouth daily.    [provider]  amLODipine (NORVASC) 5 MG tablet amlodipine 5 mg tablet  TAKE 1 TABLET BY MOUTH EVERY DAY    [provider]  apixaban (ELIQUIS) 5 MG TABS tablet Take 1 tablet (5 mg total) by mouth 2 (two) times  daily. 12/12/20   Nolberto Hanlon, MD  b complex vitamins tablet Take 1 tablet by mouth at bedtime.    [provider]  FARXIGA 10 MG TABS tablet Take 10 mg by mouth daily. 09/10/19   [provider]  fish oil-omega-3 fatty acids 1000 MG capsule Take 1 g by mouth at bedtime.     [provider]  metFORMIN (GLUCOPHAGE-XR) 500 MG 24 hr  tablet Take 500 mg by mouth daily. 08/24/19   [provider]  metoprolol succinate (TOPROL-XL) 50 MG 24 hr tablet Take 2 tablets (100 mg total) by mouth in the morning and at bedtime. Take with or immediately following a meal. 01/29/21   Almyra Deforest, PA  pregabalin (LYRICA) 50 MG capsule Take 50 mg by mouth 2 (two) times daily.    [provider]  RABEprazole (ACIPHEX) 20 MG tablet Take 1 tablet (20 mg total) by mouth 2 (two) times daily. 04/22/12   Lafayette Dragon, MD  tamsulosin (FLOMAX) 0.4 MG CAPS capsule Take 0.4 mg by mouth as needed. 07/17/16   [provider]  TRESIBA FLEXTOUCH 100 UNIT/ML FlexTouch Pen Inject 25 Units into the skin daily. 09/29/19   [provider]  valsartan (DIOVAN) 320 MG tablet Take 320 mg by mouth daily.    [provider]  venlafaxine XR (EFFEXOR-XR) 150 MG 24 hr capsule Take 150 mg by mouth 2 (two) times daily.    [provider]  zaleplon (SONATA) 5 MG capsule Take 5 mg by mouth at bedtime as needed for sleep.    [provider]    Allergies    Aspirin, Codeine, Naproxen, and Sulfonamide derivatives  Review of Systems   Review of Systems  Respiratory:  Positive for shortness of breath. Negative for cough.   Cardiovascular:  Positive for chest pain. Negative for leg swelling.  Gastrointestinal:  Negative for abdominal pain.  All other systems reviewed and are negative.  Physical Exam Updated Vital Signs BP (!) 160/67 (BP Location: Left Arm)   Pulse 70   Temp 99.5 F (37.5 C) (Oral)   Resp 20   Ht '5\' 5"'$  (1.651 m)   Wt 86.2 kg   SpO2 97%   BMI  31.62 kg/m   Physical Exam Vitals and nursing note reviewed.  Constitutional:      General: She is not in acute distress.    Appearance: She is well-developed. She is not ill-appearing or diaphoretic.  HENT:     Head: Normocephalic and atraumatic.     Right Ear: External ear normal.     Left Ear: External ear normal.     Nose: Nose normal.  Eyes:     General:        Right eye: No discharge.        Left eye: No discharge.  Cardiovascular:     Rate and Rhythm: Normal rate and regular rhythm.     Heart sounds: Normal heart sounds.  Pulmonary:     Effort: Pulmonary effort is normal.     Breath sounds: Examination of the right-lower field reveals rales. Examination of the left-lower field reveals rales. Rales present.  Abdominal:     Palpations: Abdomen is soft.     Tenderness: There is no abdominal tenderness.  Musculoskeletal:     Right lower leg: No edema.     Left lower leg: No edema.  Skin:    General: Skin is warm and dry.  Neurological:     Mental Status: She is alert.  Psychiatric:        Mood and Affect: Mood is not anxious.    ED Results / Procedures / Treatments   Labs (all labs ordered are listed, but only abnormal results are displayed) Labs Reviewed  BASIC METABOLIC PANEL - Abnormal; Notable for the following components:      Result Value   Glucose, Bld 285 (*)    All other components  within normal limits  CBC - Abnormal; Notable for the following components:   WBC 11.8 (*)    Hemoglobin 10.3 (*)    HCT 34.1 (*)    MCH 24.4 (*)    RDW 19.9 (*)    All other components within normal limits  HEPATIC FUNCTION PANEL - Abnormal; Notable for the following components:   Albumin 2.9 (*)    Alkaline Phosphatase 130 (*)    Bilirubin, Direct 0.4 (*)    All other components within normal limits  BRAIN NATRIURETIC PEPTIDE - Abnormal; Notable for the following components:   B Natriuretic Peptide 663.8 (*)    All other components within normal limits  TROPONIN I  (HIGH SENSITIVITY) - Abnormal; Notable for the following components:   Troponin I (High Sensitivity) 29 (*)    All other components within normal limits  RESP PANEL BY RT-PCR (FLU A&B, COVID) [id number]  TROPONIN I (HIGH SENSITIVITY)    EKG EKG Interpretation  Date/Time:  Thursday April 26 2021 08:47:07 EDT Ventricular Rate:  71 PR Interval:  190 QRS Duration: 116 QT Interval:  440 QTC Calculation: 478 R Axis:   -77 Text Interpretation: Normal sinus rhythm Left anterior fascicular block Minimal voltage criteria for LVH, may be normal variant ( Cornell product ) Anteroseptal infarct , age undetermined Abnormal ECG Confirmed by Sherwood Gambler [phone]) on 04/26/2021 9:53:43 AM  Radiology DG Chest 2 View  Result Date: 04/26/2021 CLINICAL DATA:  Chest pain and shortness of breath. EXAM: CHEST - 2 VIEW COMPARISON:  01/29/2021 FINDINGS: The cardio pericardial silhouette is enlarged. There is pulmonary vascular congestion without overt pulmonary edema. Bibasilar atelectasis/infiltrate associated with small bilateral pleural effusions. Bones are diffusely demineralized. IMPRESSION: Enlargement of the cardiopericardial silhouette with pulmonary vascular congestion. Bibasilar atelectasis/infiltrate and small bilateral pleural effusions. Electronically Signed   By: Misty Stanley M.D.   On: 04/26/2021 09:20    Procedures Procedures   Medications Ordered in ED Medications  nitroGLYCERIN (NITROSTAT) SL tablet 0.4 mg (has no administration in time range)  furosemide (LASIX) injection 40 mg (has no administration in time range)    ED Course  I have reviewed the triage vital signs and the nursing notes.  Pertinent labs & imaging results that were available during my care of the patient were reviewed by me and considered in my medical decision making (see chart for details).    MDM Rules/Calculators/A&P                           Presentation is most consistent with CHF.  She has bilateral  pleural effusions and edema and with the elevated BNP I think this is the most likely cause.  We will test for COVID as well.  Otherwise she has a minimal troponin elevation that is likely secondary to the CHF.  She will need admission given she is now requiring oxygen and will start diuresis and she will need an echo and further work-up.  Discussed with Dr. Lorin Mercy for admission. Final Clinical Impression(s) / ED Diagnoses Final diagnoses:  Acute respiratory failure with hypoxia (HCC)  Acute congestive heart failure, unspecified heart failure type [facility])    Rx / DC Orders ED Discharge Orders     None        Sherwood Gambler, MD 04/26/21 1152

## 2021-04-26 NOTE — Progress Notes (Signed)
  Echocardiogram 2D Echocardiogram has been performed.  Alexis [name] 04/26/2021, 3:56 PM

## 2021-04-27 ENCOUNTER — Encounter (HOSPITAL_COMMUNITY): Payer: Self-pay | Admitting: Internal Medicine

## 2021-04-27 LAB — GLUCOSE, CAPILLARY
Glucose-Capillary: 136 mg/dL — ABNORMAL HIGH (ref 70–99)
Glucose-Capillary: 280 mg/dL — ABNORMAL HIGH (ref 70–99)
Glucose-Capillary: 248 mg/dL — ABNORMAL HIGH (ref 70–99)
Glucose-Capillary: 232 mg/dL — ABNORMAL HIGH (ref 70–99)

## 2021-04-27 LAB — BASIC METABOLIC PANEL
Anion gap: 13 (ref 5–15)
BUN: 16 mg/dL (ref 8–23)
CO2: 31 mmol/L (ref 22–32)
Calcium: 8.6 mg/dL — ABNORMAL LOW (ref 8.9–10.3)
Chloride: 94 mmol/L — ABNORMAL LOW (ref 98–111)
Creatinine, Ser: 0.86 mg/dL (ref 0.44–1.00)
GFR, Estimated: >60 mL/min (ref >60)
Glucose, Bld: 161 mg/dL — ABNORMAL HIGH (ref 70–99)
Potassium: 2.8 mmol/L — ABNORMAL LOW (ref 3.5–5.1)
Sodium: 138 mmol/L (ref 135–145)

## 2021-04-27 LAB — CBC
HCT: 29.9 % — ABNORMAL LOW (ref 36.0–46.0)
Hemoglobin: 9.5 g/dL — ABNORMAL LOW (ref 12.0–15.0)
MCH: 24.8 pg — ABNORMAL LOW (ref 26.0–34.0)
MCHC: 31.8 g/dL (ref 30.0–36.0)
MCV: 78.1 fL — ABNORMAL LOW (ref 80.0–100.0)
Platelets: 276 10*3/uL (ref 150–400)
RBC: 3.83 MIL/uL — ABNORMAL LOW (ref 3.87–5.11)
RDW: 19.3 % — ABNORMAL HIGH (ref 11.5–15.5)
WBC: 8.4 10*3/uL (ref 4.0–10.5)
nRBC: 0 % (ref 0.0–0.2)

## 2021-04-27 LAB — MAGNESIUM: Magnesium: 1.6 mg/dL — ABNORMAL LOW (ref 1.7–2.4)

## 2021-04-27 MED ORDER — POTASSIUM CHLORIDE CRYS ER 20 MEQ PO TBCR
40.0000 meq | EXTENDED_RELEASE_TABLET | ORAL | Status: AC
  Administered 2021-04-27 (×3): 40 meq via ORAL
  Filled 2021-04-27 (×3): qty 2

## 2021-04-27 MED ORDER — INSULIN GLARGINE-YFGN 100 UNIT/ML ~~LOC~~ SOLN
10.0000 [IU] | Freq: Once | SUBCUTANEOUS | Status: AC
  Administered 2021-04-27: 10 [IU] via SUBCUTANEOUS
  Filled 2021-04-27: qty 0.1

## 2021-04-27 MED ORDER — DAPAGLIFLOZIN PROPANEDIOL 10 MG PO TABS
10.0000 mg | ORAL_TABLET | Freq: Every day | ORAL | Status: DC
  Administered 2021-04-27 – 2021-04-29 (×3): 10 mg via ORAL
  Filled 2021-04-27 (×3): qty 1

## 2021-04-27 MED ORDER — MAGNESIUM SULFATE 2 GM/50ML IV SOLN
2.0000 g | Freq: Once | INTRAVENOUS | Status: AC
  Administered 2021-04-27: 2 g via INTRAVENOUS
  Filled 2021-04-27: qty 50

## 2021-04-27 MED ORDER — ADULT MULTIVITAMIN W/MINERALS CH
1.0000 | ORAL_TABLET | Freq: Every day | ORAL | Status: DC
  Administered 2021-04-27 – 2021-04-29 (×3): 1 via ORAL
  Filled 2021-04-27 (×3): qty 1

## 2021-04-27 MED ORDER — TAMSULOSIN HCL 0.4 MG PO CAPS: 0.4000 mg | ORAL_CAPSULE | Freq: Every day | ORAL | Status: DC | PRN

## 2021-04-27 NOTE — Progress Notes (Signed)
Heart Failure Nurse Navigator Progress Note  PCP: Deland Pretty, MD PCP-Cardiologist: Alma Friendly., MD Admission Diagnosis: new onset dCHF Admitted from: home with spouse and daughter  Presentation:   Alexis [name] presented 9/15 with chest pain. Pt states she had increased SOB at home. Pt interactive with interview process. Pt very concerned about being diagnosed with heart failure, gave patient education booklet and reviewed basic information. Pt a bit overwhelmed and anxious. Explained importance of low sodium diet and fluid restrictions. Pt voiced frustration regarding why cardiologist hasn't been to see her yet. Explained attending and consults to patient. Pt states she plans to call Dr. Lysbeth Penner office to make aware she is hospitalized. Explained potential for HV TOC clinic appt.   ECHO/ LVEF: 65-70% 11/2020 60-65%  Clinical Course:  Past Medical History:  Diagnosis Date   Anxiety    Arthritis    "knees" (05/25/2014)   Chronic back pain    Depression    Diabetes mellitus type II    Diabetic peripheral neuropathy ([place]) 10/20/2018   Diverticulitis    Fatty liver    Fibromyalgia    Gastric polyp    hyperplastic   Gastroparesis    "recently dx'd" (05/25/2014)   GERD (gastroesophageal reflux disease)    H/O hiatal hernia    Hx of gastritis    Hyperlipidemia    Hypertension    Irritable bowel syndrome (IBS)    Memory difficulties 09/18/2017   Migraine without aura, without mention of intractable migraine without mention of status migrainosus    "related to allergies; have them in the spring and fall" (05/25/2014)   Obesity    Sleep apnea    "wore mask; took it off in my sleep; quit wearing it" (05/25/2014)   Uterine cancer ([place]) dx'd 2000   surg only   Vasculitis ([place])    "irritates my legs"   Wears glasses      Social History   Socioeconomic History   Marital status: Married    Spouse name: Not on file   Number of children: 2   Years of education: Not on file    Highest education level: Not on file  Occupational History   Occupation: Retired    Fish farm manager: RETIRED  Tobacco Use   Smoking status: Never   Smokeless tobacco: Never  Vaping Use   Vaping Use: Never used  Substance and Sexual Activity   Alcohol use: No   Drug use: No   Sexual activity: Never  Other Topics Concern   Not on file  Social History Narrative   Daily Caffeine, Coke   Social Determinants of Health   Financial Resource Strain: Not on file  Food Insecurity: Not on file  Transportation Needs: Not on file  Physical Activity: Not on file  Stress: Not on file  Social Connections: Not on file    High Risk Criteria for Readmission and/or Poor Patient Outcomes: Heart failure hospital admissions (last 6 months): 1  No Show rate: 1% Difficult social situation: no Demonstrates medication adherence: yes Primary Language: English Literacy level: able to read/write and comprehend.  Education Assessment and Provision:  Detailed education and instructions provided on heart failure disease management including the following:  Signs and symptoms of Heart Failure When to call the physician Importance of daily weights Low sodium diet Fluid restriction Medication management Anticipated future follow-up appointments  Patient education given on each of the above topics.  Patient acknowledges understanding via teach back method and acceptance of all instructions.  Education Materials:  "  Living Better With Heart Failure" Booklet, HF zone tool, & Daily Weight Tracker Tool.  Patient has scale at home: yes Patient has pill box at home: yes   Barriers of Care:   -new Dx  Considerations/Referrals:   Referral made to Heart Failure Pharmacist Stewardship: no Referral made to Heart Failure CSW/NCM TOC: no Referral made to Heart & Vascular TOC clinic: pending, if no gen cardiology appt will schedule HV TOC.  Items for Follow-up on DC/TOC: -optimize -HF education   Pricilla Holm, MSN, RN Heart Failure Nurse Navigator [phone]

## 2021-04-27 NOTE — Evaluation (Signed)
Occupational Therapy Evaluation Patient Details Name: Alexis [name] MRN: [id number] DOB: 1951-01-16 Today's Date: 04/27/2021   History of Present Illness Pt is a 70 y.o. female who presented 04/26/21 with chest pain and SOB. Pt admitted with acute new CHF. CXR consistent with mild pulmonary edema. PMH: chronic back pain; DM; fibromyalgia; HTN; HLD; IBS; OSA; obesity; and remote uterine cancer   Clinical Impression   Pt admitted with the above diagnoses and presents with below problem list. Pt will benefit from continued acute OT to address the below listed deficits and maximize independence with basic ADLs prior to d/c back home. At baseline, pt is independent with basic ADLs. She has been going to OPPT since previous admission. Pt is currently min guard with LB ADLs and household distance mobility. Began education on energy conservation for managing ADL routines at home.       Recommendations for follow up therapy are one component of a multi-disciplinary discharge planning process, led by the attending physician.  Recommendations may be updated based on patient status, additional functional criteria and insurance authorization.   Follow Up Recommendations  No OT follow up    Equipment Recommendations  None recommended by OT    Recommendations for Other Services       Precautions / Restrictions Precautions Precautions: Fall Precaution Comments: monitor SpO2 Restrictions Weight Bearing Restrictions: No      Mobility Bed Mobility               General bed mobility comments: Pt sitting EOB at start and end of session    Transfers Overall transfer level: Needs assistance Equipment used: None Transfers: Sit to/from Stand Sit to Stand: Supervision         General transfer comment: Supervision for safety, no LOB.    Balance Overall balance assessment: Needs assistance Sitting-balance support: Feet supported;No upper extremity supported Sitting balance-Leahy  Scale: Good     Standing balance support: No upper extremity supported Standing balance-Leahy Scale: Fair Standing balance comment: intermittent external supoort for dynamic standing                           ADL either performed or assessed with clinical judgement   ADL Overall ADL's : Needs assistance/impaired Eating/Feeding: Set up   Grooming: Set up;Sitting   Upper Body Bathing: Set up;Sitting   Lower Body Bathing: Sit to/from stand;Min guard   Upper Body Dressing : Set up;Sitting   Lower Body Dressing: Min guard;Sit to/from stand   Toilet Transfer: Min guard;Ambulation   Toileting- Clothing Manipulation and Hygiene: Min guard;Sit to/from stand   Tub/ Shower Transfer: Min guard;Minimal assistance   Functional mobility during ADLs: Min guard;Minimal assistance General ADL Comments: DOE 2/4. Pt reports "heaviness" in her chest with OOB activity. Noted to seek intermittent, single UE support during functional mobility. Began education on energy conservation for managing ADLs.     Vision         Perception     Praxis      Pertinent Vitals/Pain Pain Assessment: No/denies pain     Hand Dominance Right   Extremity/Trunk Assessment Upper Extremity Assessment Upper Extremity Assessment: Overall WFL for tasks assessed;Generalized weakness   Lower Extremity Assessment Lower Extremity Assessment: Defer to PT evaluation       Communication Communication Communication: No difficulties   Cognition Arousal/Alertness: Awake/alert Behavior During Therapy: WFL for tasks assessed/performed Overall Cognitive Status: Within Functional Limits for tasks assessed  General Comments  Pt reports needing HHA to complete second part of household distance mobility this morning.    Exercises     Shoulder Instructions      Home Living Family/patient expects to be discharged to:: Private residence Living  Arrangements: Spouse/significant other;Children Available Help at Discharge: Family;Available 24 hours/day Type of Home: House Home Access: Stairs to enter [employer] of Steps: 6 Entrance Stairs-Rails: Can reach both Home Layout: One level     Bathroom Shower/Tub: Occupational psychologist: Handicapped height     Home Equipment: Environmental consultant - 2 wheels;Grab bars - toilet;Toilet riser;Cane - single point;Shower seat;Wheelchair - manual          Prior Functioning/Environment Level of Independence: Independent        Comments: Does not use AD for mobility.        OT Problem List: Decreased activity tolerance;Impaired balance (sitting and/or standing);Decreased knowledge of use of DME or AE;Decreased knowledge of precautions;Cardiopulmonary status limiting activity;Decreased strength      OT Treatment/Interventions: Self-care/ADL training;Energy conservation;Therapeutic exercise;DME and/or AE instruction;Therapeutic activities;Patient/family education;Balance training    OT Goals(Current goals can be found in the care plan section) Acute Rehab OT Goals Patient Stated Goal: to get better and go back to OPPT OT Goal Formulation: With patient/family Time For Goal Achievement: 05/11/21 Potential to Achieve Goals: Good ADL Goals Pt Will Perform Grooming: with modified independence;standing Pt Will Transfer to Toilet: with modified independence;ambulating Pt Will Perform Toileting - Clothing Manipulation and hygiene: with modified independence;sit to/from stand Pt Will Perform Tub/Shower Transfer: with modified independence;ambulating  OT Frequency: Min 2X/week   Barriers to D/C:            Co-evaluation              AM-PAC OT "6 Clicks" Daily Activity     Outcome Measure Help from another person eating meals?: None Help from another person taking care of personal grooming?: None Help from another person toileting, which includes using toliet, bedpan,  or urinal?: None Help from another person bathing (including washing, rinsing, drying)?: A Little Help from another person to put on and taking off regular upper body clothing?: None Help from another person to put on and taking off regular lower body clothing?: None 6 Click Score: 23   End of Session Equipment Utilized During Treatment: Oxygen  Activity Tolerance: Patient tolerated treatment well;Other (comment) (DOE 2/4) Patient left: with call bell/phone within reach;with family/visitor present;Other (comment) (EOB)  OT Visit Diagnosis: Unsteadiness on feet (R26.81);Muscle weakness (generalized) (M62.81);Pain                Time: [id number] OT Time Calculation (min): 24 min Charges:  OT General Charges $OT Visit: 1 Visit OT Evaluation $OT Eval Low Complexity: 1 Low OT Treatments $Self Care/Home Management : 8-22 mins  Tyrone Schimke, OT Acute Rehabilitation Services Pager: [phone] Office: [phone]   Hortencia Pilar 04/27/2021, 4:42 PM

## 2021-04-27 NOTE — Progress Notes (Signed)
Mobility Specialist Progress Note    04/27/21 1130  Mobility  Activity Ambulated in hall  Level of Assistance Contact guard assist, steadying assist  Assistive Device None  Distance Ambulated (ft) 276 ft  Mobility Ambulated with assistance in hallway  Mobility Response Tolerated well  Mobility performed by Mobility specialist  $Mobility charge 1 Mobility   Pt found EOB and agreeable to ambulation. Per advisement of RN pt was placed on 2LO2. Pt took x2 standing break c/o SOB and feeling weakness in LLE but did not experience any LOB. Pt returned to EOB with husband present in room.   SATURATION QUALIFICATIONS: (This note is used to comply with regulatory documentation for home oxygen)  Patient Saturations on Room Air at Rest = 88%  Patient Saturations on Room Air while Ambulating = n/a  Patient Saturations on 2 Liters of oxygen while Ambulating = 96%  Please briefly explain why patient needs home oxygen: pt tolerated ambulating on 2LO2.   Pre-Mobility: 62 HR During Mobility: 66 HR Post-Mobility: 62 HR  Astronomer Phone: [phone]

## 2021-04-27 NOTE — TOC Progression Note (Signed)
Transition of Care [facility]) - Progression Note    Patient Details  Name: Alexis [name] MRN: [id number] Date of Birth: Feb 02, 1951  [facility] CM/SW Contact  Zenon Mayo, RN Phone Number: 04/27/2021, 1:09 PM  Clinical Narrative:    NCM spoke with patient, she will be for dc tomorrow, she would like to continue with outpatient physical therapy a [facility].  NCM left script on chart for MD to sign so that she can resume this. Please give to patient after MD signs it.  Her spouse will transport her home at discharge.  Patient states she is ok with [employer] supplying her oxygen if she needs it when she goes home.    Expected Discharge Plan: Home/Self Care Barriers to Discharge: Continued Medical Work up  Expected Discharge Plan and Services Expected Discharge Plan: Home/Self Care   Discharge Planning Services: CM Consult   Living arrangements for the past 2 months: Single Family Home                   DME Agency: NA       HH Arranged: NA           Social Determinants of Health (SDOH) Interventions    Readmission Risk Interventions No flowsheet data found.

## 2021-04-27 NOTE — Plan of Care (Signed)
  Problem: Education: Goal: Knowledge of General Education information will improve Description Including pain rating scale, medication(s)/side effects and non-pharmacologic comfort measures Outcome: Progressing   Problem: Health Behavior/Discharge Planning: Goal: Ability to manage health-related needs will improve Outcome: Progressing   

## 2021-04-27 NOTE — Progress Notes (Addendum)
Initial Nutrition Assessment  DOCUMENTATION CODES:   Not applicable  INTERVENTION:   -Magic cup (chocolate) BID with meals, each supplement provides 290 kcal and 9 grams of protein  -Multivitamin w/ minerals  -Low Sodium diet education and handout provided   NUTRITION DIAGNOSIS:   Increased nutrient needs related to chronic illness (CHF) as evidenced by estimated needs.   GOAL:   Patient will meet greater than or equal to 90% of their needs   MONITOR:   PO intake, Supplement acceptance, Weight trends  REASON FOR ASSESSMENT:   Consult Other (Comment) (Nutrition Goals)  ASSESSMENT:   70 y.o. female presented to the ED on 9/15 with chest pressure, fatigue, and SOB. PMH of T2DM, HTN, IBS, and gastroparesis. Pt admitted with new onset of CHF.  Pt reports that her appetite has been well and that she has been eating fairly normal. Reports that is could have been poor fro about 1 week prior to admission due to nausea, but stated that she knew she needed to eat so she did. Pt reports that she eats 2 meals per day, breakfast and supper, and one snack around lunch time. For breakfast she typically has bacon, egg, and cheese on a croissant; Supper: lasagna, garlic bread, and salad, or they eat out occasionally, Snack: [employer] snack packs (cheese, crackers). Reports that she has recently been drinking a lot of water and unsweet tea; cutting out soda, but will drink a Dt. Mtn. Dew occasionally. Pt reports that she met with a dietitian in outpatient and is scheduled to follow-up with them in October.  Per EMR, pt ate 100% of her dinner on 9/15 and breakfast on 9/16.   Pt reports no significant issues related to her gastroparesis. Reports that her IBS causes her to go to the bathroom within 30 minutes of eating dinner, majority of the time diarrhea and will sit on the toilet for 30 minutes.  Pt reports a UBW of 170-180# prior to a hip fracture in May 2022. Reports that she got up  into the 200's post-surgery, now down 175-185#. Per EMR & Care Everywhere, no documentation supporting any significant weight loss.    Provided pt and husband with Low-Sodium Diet handout and discussed way for pt to reduce sodium and fluid to help with fluid retention. Pt demonstrated good understanding and was engaged during the discussion.   Discussed ONS with pt; pt reports having Ensure previously and it gave her extreme diarrhea after drinking. Pt agreeable to try Magic Cup.   Medications reviewed and include: Eliquis, Lasix, NovoLog 0-15 units w/ meals & 0-5 units @ bedtime, Semglee 25 units, Potassium Chloride, Spironolactone Labs reviewed: Potassium 2.8, Hgb A1c 8.3, 24 hr BG trends 136-280   NUTRITION - FOCUSED PHYSICAL EXAM:  Flowsheet Row Most Recent Value  Orbital Region Mild depletion  Upper Arm Region No depletion  Thoracic and Lumbar Region No depletion  Buccal Region Mild depletion  Temple Region No depletion  Clavicle Bone Region No depletion  Clavicle and Acromion Bone Region No depletion  Scapular Bone Region No depletion  Dorsal Hand Mild depletion  Patellar Region No depletion  Anterior Thigh Region No depletion  Posterior Calf Region No depletion  Edema (RD Assessment) None  Hair Reviewed  Eyes Reviewed  Mouth Reviewed  Skin Reviewed  Nails Unable to assess  [Nail Polish]       Diet Order:   Diet Order             Diet heart healthy/carb  modified Room service appropriate? Yes; Fluid consistency: Thin; Fluid restriction: 1500 mL Fluid  Diet effective now                   EDUCATION NEEDS:   Education needs have been addressed  Skin:  Skin Assessment: Reviewed RN Assessment  Last BM:  Prior to Admission  Height:   Ht Readings from Last 1 Encounters:  04/26/21 _0  (1.651 m)    Weight:   Wt Readings from Last 1 Encounters:  04/27/21 83.1 kg    Ideal Body Weight:  56.8 kg  BMI:  Body mass index is 30.47 kg/m.  Estimated  Nutritional Needs:   Kcal:  1800-2000  Protein:  90-105 grams  Fluid:  >1.8 L    Harl Wiechmann BS, PLDN Clinical Dietitian See AMiON for contact information.

## 2021-04-27 NOTE — Progress Notes (Addendum)
Active PROGRESS NOTE    Alexis [name]  [id number] DOB: [birth year] DOA: 04/26/2021 PCP: Deland Pretty, MD   Brief Narrative:  HPI: Alexis [name] is a 70 y.o. female with medical history significant of chronic back pain; DM; fibromyalgia; HTN; HLD; IBS; OSA; obesity; and remote uterine cancer presenting with CP.  She reports that she started not feeling too well Sunday, just no energy and like something was sitting on her chest.  She had broken her hip in May and is still in therapy; she went for PT on Monday and reported feeling exhausted. She felt even worse on Tuesday, like something was in her throat "like a mouse or something"; she was hearing little squeaks when she breathed.  She took Claritin without relief.  She even took a COVID test on Tuesday.  She was getting worse and worse.  Her husband checked her BP last night and it was 165-179/60s.  She was up every hour overnight.  Bending over to pick something up made her so SOB.  The more she moved, the worse she felt.  And she still felt i someone was sitting on her chest.  She had been scheduled for an appointment with cardiology on Tuesday and canceled it due to feeling poorly.  +orthopnea.  Intermittent cough, mild. +LE edema, mostly in the afternoons or if she up a lot.       ED Course: Acute new CHF.  "Off" since Monday - fatigued without specific symptoms.  Last night with CP, SOB, orthopnea.  CXR with edema, BNP elevated.  Giving Lasix.  Assessment & Plan:   Principal Problem:   New onset of congestive heart failure (HCC) Active Problems:   DM (diabetes mellitus), type 2, uncontrolled (HCC)   Hyperlipidemia   Anxiety and depression   Essential hypertension   Obesity (BMI 30-39.9)  Acute hypoxic respiratory failure secondary to acute on chronic diastolic congestive heart failure: BNP slightly elevated, chest x-ray with pulmonary vascular congestion.  Echo shows normal ejection fraction.  Patient received significant  diuresis and has had significant urine output as well.  Feels better.  Not requiring any oxygen at rest but is requiring 2 L at deep.  We will continue Lasix 40 mg IV twice daily, strict I's and O's, low-sodium diet and daily weight and hopefully discharge home tomorrow.  Chest heaviness/pain: Troponin very slightly elevated but flat.  Echo with no wall motion abnormality.  Likely demand ischemia.  Essential hypertension: Very well controlled.  Norvasc on hold.  Continue Toprol XL and 1.  Hypokalemia: 2.8.  Will replace.  Recheck in the morning.  Checking magnesium.  Type 2 diabetes mellitus: Hemoglobin A1c 8.3.  Blood sugar slightly elevated.  Continue SSI.  Add Lantus 10 units.  Paroxysmal atrial fibrillation: Rates controlled.  Continue beta-blocker and Eliquis.  Depression/IBS/fibromyalgia: Continue Effexor, Xanax, Sonata and Lyrica.  Obesity: BMI 31.6 twos kg/m2.  Weight loss encouraged.  DVT prophylaxis:    Code Status: Full Code  Family Communication:  None present at bedside.  Plan of care discussed with patient in length and he verbalized understanding and agreed with it.  Status is: Inpatient  Remains inpatient appropriate because:Inpatient level of care appropriate due to severity of illness  Dispo: The patient is from: Home              Anticipated d/c is to: Home              Patient currently is not medically stable to d/c.  Difficult to place patient No        Estimated body mass index is 30.47 kg/m as calculated from the following:   Height as of this encounter: '5\' 5"'$  (1.651 m).   Weight as of this encounter: 83.1 kg.     Nutritional Assessment: Body mass index is 30.47 kg/m.Marland Kitchen Seen by dietician.  I agree with the assessment and plan as outlined below: Nutrition Status: Nutrition Problem: Increased nutrient needs Etiology: chronic illness (CHF) Signs/Symptoms: estimated needs Interventions: MVI, Magic cup, Education  .  Skin Assessment: I have  examined the patient's skin and I agree with the wound assessment as performed by the wound care RN as outlined below:    Consultants:  None  Procedures:  None  Antimicrobials:  Anti-infectives (From admission, onward)    None          Subjective: Seen and examined.  States that feeling much better but with very minimal shortness of breath with exertion.  Not at rest.  Some chest heaviness as well.  No other complaint.  Objective: Vitals:   04/27/21 0250 04/27/21 0805 04/27/21 0840 04/27/21 1208  BP: (!) 150/55 (!) 138/54 (!) 143/58 (!) 133/48  Pulse: 67 60 (!) 59 (!) 59  Resp: 17  18   Temp: 98.3 F (36.8 C) 98.6 F (37 C) 98.7 F (37.1 C) 98.4 F (36.9 C)  TempSrc: Oral Oral Oral Oral  SpO2: 90% 98% 95% 99%  Weight: 83.1 kg     Height:        Intake/Output Summary (Last 24 hours) at 04/27/2021 1336 Last data filed at 04/27/2021 0850 Gross per 24 hour  Intake 283 ml  Output 1950 ml  Net -1667 ml   Filed Weights   04/26/21 1016 04/26/21 1629 04/27/21 0250  Weight: 86.2 kg 84.1 kg 83.1 kg    Examination:  General exam: Appears calm and comfortable  Respiratory system: Clear to auscultation. Respiratory effort normal. Cardiovascular system: S1 & S2 heard, RRR. No JVD, murmurs, rubs, gallops or clicks.  Trace pitting edema bilateral lower extremity. Gastrointestinal system: Abdomen is nondistended, soft and nontender. No organomegaly or masses felt. Normal bowel sounds heard. Central nervous system: Alert and oriented. No focal neurological deficits. Extremities: Symmetric 5 x 5 power. Skin: No rashes, lesions or ulcers Psychiatry: Judgement and insight appear normal. Mood & affect appropriate.    Data Reviewed: I have personally reviewed following labs and imaging studies  CBC: Recent Labs  Lab 04/26/21 0858 04/27/21 0242  WBC 11.8* 8.4  HGB 10.3* 9.5*  HCT 34.1* 29.9*  MCV 80.8 78.1*  PLT 280 [id number]   Basic Metabolic Panel: Recent Labs  Lab  04/26/21 0858 04/27/21 0242  NA 135 138  K 3.5 2.8*  CL 99 94*  CO2 23 31  GLUCOSE 285* 161*  BUN 14 16  CREATININE 0.76 0.86  CALCIUM 8.9 8.6*  MG  --  1.6*   GFR: Estimated Creatinine Clearance: 65.7 mL/min (by C-G formula based on SCr of 0.86 mg/dL). Liver Function Tests: Recent Labs  Lab 04/26/21 0949  AST 34  ALT 37  ALKPHOS 130*  BILITOT 1.2  PROT 6.8  ALBUMIN 2.9*   No results for input(s): LIPASE, AMYLASE in the last 168 hours. No results for input(s): AMMONIA in the last 168 hours. Coagulation Profile: No results for input(s): INR, PROTIME in the last 168 hours. Cardiac Enzymes: No results for input(s): CKTOTAL, CKMB, CKMBINDEX, TROPONINI in the last 168 hours. BNP (last 3 results) No  results for input(s): PROBNP in the last 8760 hours. HbA1C: Recent Labs    04/26/21 1655  HGBA1C 8.3*   CBG: Recent Labs  Lab 04/26/21 1648 04/26/21 2318 04/27/21 0548 04/27/21 1128  GLUCAP 280* 162* 136* 280*   Lipid Profile: Recent Labs    04/26/21 1655  CHOL 169  HDL 51  LDLCALC 95  TRIG 113  CHOLHDL 3.3   Thyroid Function Tests: No results for input(s): TSH, T4TOTAL, FREET4, T3FREE, THYROIDAB in the last 72 hours. Anemia Panel: No results for input(s): VITAMINB12, FOLATE, FERRITIN, TIBC, IRON, RETICCTPCT in the last 72 hours. Sepsis Labs: No results for input(s): PROCALCITON, LATICACIDVEN in the last 168 hours.  Recent Results (from the past 240 hour(s))  Resp Panel by RT-PCR (Flu A&B, Covid) Nasopharyngeal Swab     Status: None   Collection Time: 04/26/21 10:11 AM   Specimen: Nasopharyngeal Swab; Nasopharyngeal(NP) swabs in vial transport medium  Result Value Ref Range Status   SARS Coronavirus 2 by RT PCR NEGATIVE NEGATIVE Final    Comment: (NOTE) SARS-CoV-2 target nucleic acids are NOT DETECTED.  The SARS-CoV-2 RNA is generally detectable in upper respiratory specimens during the acute phase of infection. The lowest concentration of SARS-CoV-2  viral copies this assay can detect is 138 copies/mL. A negative result does not preclude SARS-Cov-2 infection and should not be used as the sole basis for treatment or other patient management decisions. A negative result may occur with  improper specimen collection/handling, submission of specimen other than nasopharyngeal swab, presence of viral mutation(s) within the areas targeted by this assay, and inadequate number of viral copies(<138 copies/mL). A negative result must be combined with clinical observations, patient history, and epidemiological information. The expected result is Negative.  Fact Sheet for Patients:  EntrepreneurPulse.com.au  Fact Sheet for Healthcare Providers:  IncredibleEmployment.be  This test is no t yet approved or cleared by the [place] FDA and  has been authorized for detection and/or diagnosis of SARS-CoV-2 by FDA under an Emergency Use Authorization (EUA). This EUA will remain  in effect (meaning this test can be used) for the duration of the COVID-19 declaration under Section 564(b)(1) of the Act, 21 U.S.C.section 360bbb-3(b)(1), unless the authorization is terminated  or revoked sooner.       Influenza A by PCR NEGATIVE NEGATIVE Final   Influenza B by PCR NEGATIVE NEGATIVE Final    Comment: (NOTE) The Xpert Xpress SARS-CoV-2/FLU/RSV plus assay is intended as an aid in the diagnosis of influenza from Nasopharyngeal swab specimens and should not be used as a sole basis for treatment. Nasal washings and aspirates are unacceptable for Xpert Xpress SARS-CoV-2/FLU/RSV testing.  Fact Sheet for Patients: EntrepreneurPulse.com.au  Fact Sheet for Healthcare Providers: IncredibleEmployment.be  This test is not yet approved or cleared by the [place] FDA and has been authorized for detection and/or diagnosis of SARS-CoV-2 by FDA under an Emergency Use Authorization  (EUA). This EUA will remain in effect (meaning this test can be used) for the duration of the COVID-19 declaration under Section 564(b)(1) of the Act, 21 U.S.C. section 360bbb-3(b)(1), unless the authorization is terminated or revoked.  Performed at [facility], [place] [address], [place], [place] [address]       Radiology Studies: DG Chest 2 View  Result Date: 04/26/2021 CLINICAL DATA:  Chest pain and shortness of breath. EXAM: CHEST - 2 VIEW COMPARISON:  01/29/2021 FINDINGS: The cardio pericardial silhouette is enlarged. There is pulmonary vascular congestion without overt pulmonary edema. Bibasilar atelectasis/infiltrate  associated with small bilateral pleural effusions. Bones are diffusely demineralized. IMPRESSION: Enlargement of the cardiopericardial silhouette with pulmonary vascular congestion. Bibasilar atelectasis/infiltrate and small bilateral pleural effusions. Electronically Signed   By: Misty Stanley M.D.   On: 04/26/2021 09:20   ECHOCARDIOGRAM COMPLETE  Result Date: 04/26/2021    ECHOCARDIOGRAM REPORT   Patient Name:   Alexis [name] Date of Exam: 04/26/2021 Medical Rec #:  [id number]         Height:       65.0 in Accession #:    [id number]        Weight:       190.0 lb Date of Birth:  September 17, 1950         BSA:          1.935 m Patient Age:    23 years          BP:           149/70 mmHg Patient Gender: F                 HR:           65 bpm. Exam Location:  Inpatient Procedure: 2D Echo Indications:    acute systolic chf  History:        Patient has prior history of Echocardiogram examinations, most                 recent 12/07/2020. Risk Factors:Hypertension, Dyslipidemia,                 Diabetes and Sleep Apnea.  Sonographer:    Johny Chess RDCS Referring Phys: [id number] JENNIFER YATES  Sonographer Comments: Breast density and Image acquisition challenging due to patient body habitus. IMPRESSIONS  1. Left ventricular ejection fraction, by estimation, is 65 to 70%. The left  ventricle has normal function. The left ventricle has no regional wall motion abnormalities.  2. Right ventricular systolic function is normal. The right ventricular size is normal. There is mildly elevated pulmonary artery systolic pressure.  3. Trivial mitral valve regurgitation.  4. The aortic valve is normal in structure. Aortic valve regurgitation is not visualized.  5. The inferior vena cava is normal in size with greater than 50% respiratory variability, suggesting right atrial pressure of 3 mmHg. FINDINGS  Left Ventricle: Left ventricular ejection fraction, by estimation, is 65 to 70%. The left ventricle has normal function. The left ventricle has no regional wall motion abnormalities. The left ventricular internal cavity size was normal in size. There is  no left ventricular hypertrophy. Right Ventricle: The right ventricular size is normal. Right vetricular wall thickness was not assessed. Right ventricular systolic function is normal. There is mildly elevated pulmonary artery systolic pressure. The tricuspid regurgitant velocity is 3.01 m/s, and with an assumed right atrial pressure of 3 mmHg, the estimated right ventricular systolic pressure is 123XX123 mmHg. Left Atrium: Left atrial size was normal in size. Right Atrium: Right atrial size was normal in size. Pericardium: There is no evidence of pericardial effusion. Mitral Valve: There is mild thickening of the mitral valve leaflet(s). There is mild calcification of the mitral valve leaflet(s). Mild to moderate mitral annular calcification. Trivial mitral valve regurgitation. MV peak gradient, 7.6 mmHg. The mean mitral valve gradient is 4.0 mmHg. Tricuspid Valve: The tricuspid valve is normal in structure. Tricuspid valve regurgitation is trivial. Aortic Valve: The aortic valve is normal in structure. Aortic valve regurgitation is not visualized. Pulmonic Valve: The pulmonic valve was grossly normal. Pulmonic valve  regurgitation is not visualized. Aorta:  The aortic root and ascending aorta are structurally normal, with no evidence of dilitation. Venous: The inferior vena cava is normal in size with greater than 50% respiratory variability, suggesting right atrial pressure of 3 mmHg. IAS/Shunts: No atrial level shunt detected by color flow Doppler.  LEFT VENTRICLE PLAX 2D LVIDd:         4.40 cm  Diastology LVIDs:         2.70 cm  LV e' medial:    4.90 cm/s LV PW:         0.90 cm  LV E/e' medial:  25.1 LV IVS:        1.10 cm  LV e' lateral:   5.98 cm/s LVOT diam:     1.80 cm  LV E/e' lateral: 20.6 LV SV:         68 LV SV Index:   35 LVOT Area:     2.54 cm  RIGHT VENTRICLE            IVC RV S prime:     9.79 cm/s  IVC diam: 1.80 cm TAPSE (M-mode): 2.3 cm LEFT ATRIUM             Index       RIGHT ATRIUM           Index LA diam:        4.30 cm 2.22 cm/m  RA Area:     12.20 cm LA Vol (A2C):   73.6 ml 38.03 ml/m RA Volume:   26.10 ml  13.49 ml/m LA Vol (A4C):   58.9 ml 30.43 ml/m LA Biplane Vol: 67.0 ml 34.62 ml/m  AORTIC VALVE LVOT Vmax:   129.00 cm/s LVOT Vmean:  83.400 cm/s LVOT VTI:    0.268 m  AORTA Ao Root diam: 3.00 cm Ao Asc diam:  3.50 cm MITRAL VALVE                TRICUSPID VALVE MV Area (PHT): 4.49 cm     TR Peak grad:   36.2 mmHg MV Area VTI:   1.52 cm     TR Vmax:        301.00 cm/s MV Peak grad:  7.6 mmHg MV Mean grad:  4.0 mmHg     SHUNTS MV Vmax:       1.38 m/s     Systemic VTI:  0.27 m MV Vmean:      90.3 cm/s    Systemic Diam: 1.80 cm MV Decel Time: 169 msec MV E velocity: 123.00 cm/s MV A velocity: 113.00 cm/s MV E/A ratio:  1.09 Dorris Carnes MD Electronically signed by Dorris Carnes MD Signature Date/Time: 04/26/2021/4:05:27 PM    Final     Scheduled Meds:  ALPRAZolam  0.5 mg Oral QHS   apixaban  5 mg Oral BID   docusate sodium  100 mg Oral BID   furosemide  40 mg Intravenous BID   insulin aspart  0-15 Units Subcutaneous TID WC   insulin aspart  0-5 Units Subcutaneous QHS   insulin glargine-yfgn  25 Units Subcutaneous Daily   irbesartan   300 mg Oral Daily   metoprolol succinate  100 mg Oral BID   multivitamin with minerals  1 tablet Oral Daily   pantoprazole  40 mg Oral Daily   potassium chloride  40 mEq Oral Q4H   pregabalin  50 mg Oral BID   sodium chloride flush  3 mL Intravenous Q12H   spironolactone  12.5 mg Oral Daily   venlafaxine XR  150 mg Oral BID   Continuous Infusions:   LOS: 1 day   Time spent: 35 minutes   Darliss Cheney, MD [facility]  04/27/2021, 1:36 PM  Please page via [place] and do not message via secure chat for anything urgent. Secure chat can be used for anything non urgent.  How to contact the [facility] Attending or Consulting provider [place] or covering provider during after hours [place], for this patient?  Check the care team in [facility] and look for a) attending/consulting [facility] provider listed and b) the [facility] team listed. Page or secure chat [phone]. Log into [email or web address] and use [place]'s universal password to access. If you do not have the password, please contact the hospital operator. Locate the [facility] provider you are looking for under [facility] and page to a number that you can be directly reached. If you still have difficulty reaching the provider, please page the [facility] (Director on Call) for the Hospitalists listed on amion for assistance.

## 2021-04-28 LAB — BASIC METABOLIC PANEL
Anion gap: 10 (ref 5–15)
BUN: 23 mg/dL (ref 8–23)
CO2: 31 mmol/L (ref 22–32)
Calcium: 9 mg/dL (ref 8.9–10.3)
Chloride: 99 mmol/L (ref 98–111)
Creatinine, Ser: 0.83 mg/dL (ref 0.44–1.00)
GFR, Estimated: >60 mL/min (ref >60)
Glucose, Bld: 138 mg/dL — ABNORMAL HIGH (ref 70–99)
Potassium: 4.4 mmol/L (ref 3.5–5.1)
Sodium: 140 mmol/L (ref 135–145)

## 2021-04-28 LAB — GLUCOSE, CAPILLARY
Glucose-Capillary: 159 mg/dL — ABNORMAL HIGH (ref 70–99)
Glucose-Capillary: 185 mg/dL — ABNORMAL HIGH (ref 70–99)
Glucose-Capillary: 211 mg/dL — ABNORMAL HIGH (ref 70–99)
Glucose-Capillary: 198 mg/dL — ABNORMAL HIGH (ref 70–99)

## 2021-04-28 LAB — MAGNESIUM: Magnesium: 2.2 mg/dL (ref 1.7–2.4)

## 2021-04-28 MED ORDER — INSULIN GLARGINE-YFGN 100 UNIT/ML ~~LOC~~ SOLN
10.0000 [IU] | Freq: Once | SUBCUTANEOUS | Status: AC
  Administered 2021-04-28: 10 [IU] via SUBCUTANEOUS
  Filled 2021-04-28: qty 0.1

## 2021-04-28 MED ORDER — INSULIN GLARGINE-YFGN 100 UNIT/ML ~~LOC~~ SOLN
35.0000 [IU] | Freq: Every day | SUBCUTANEOUS | Status: DC
  Filled 2021-04-28: qty 0.35

## 2021-04-28 NOTE — Progress Notes (Signed)
SATURATION QUALIFICATIONS: (This note is used to comply with regulatory documentation for home oxygen)  Patient Saturations on Room Air at Rest = 95%  Patient Saturations on Room Air while Ambulating = 87%  Patient Saturations on 2 Liters of oxygen while Ambulating = 97%  Please briefly explain why patient needs home oxygen:

## 2021-04-28 NOTE — Progress Notes (Signed)
Active PROGRESS NOTE    Alexis [name]  [id number] DOB: [birth year] DOA: 04/26/2021 PCP: Deland Pretty, MD   Brief Narrative:  AHMIRA [name] is a 70 y.o. female with medical history significant of chronic back pain; DM; fibromyalgia; HTN; HLD; IBS; OSA; obesity; and remote uterine cancer presented  with CP as well as orthopnea and high blood pressure and lower extremity edema. She had been scheduled for an appointment with cardiology on Tuesday and canceled it due to feeling poorly.  Upon arrival to ED, she was having acute hypoxic respiratory failure, chest x-ray showed congestive heart failure.  Admitted under hospitalist service and started on diuretics.  Assessment & Plan:   Principal Problem:   New onset of congestive heart failure (HCC) Active Problems:   DM (diabetes mellitus), type 2, uncontrolled (HCC)   Hyperlipidemia   Anxiety and depression   Essential hypertension   Obesity (BMI 30-39.9)  Acute hypoxic respiratory failure secondary to acute on chronic diastolic congestive heart failure: BNP slightly elevated, chest x-ray with pulmonary vascular congestion.  Echo shows normal ejection fraction.  Patient received significant diuresis and has had significant urine output as well.  Feels better.  Now requiring 1 to 2 L of oxygen.  She and her Alexis [name] prefers for her not to go home with oxygen.  She dropped her oxygen saturation to 87% on room air with exertion requiring oxygen.  We will keep her for diuresis today and hopefully discharge tomorrow.  Chest heaviness/pain: Troponin very slightly elevated but flat.  Echo with no wall motion abnormality.  Likely demand ischemia.  Essential hypertension: Very well controlled.  Norvasc on hold.  Continue Toprol XL and 1.  Hypokalemia: resolved.  Type 2 diabetes mellitus: Hemoglobin A1c 8.3.  Received 35 units of Lantus yesterday, blood sugar fairly controlled.  Continue this.  Paroxysmal atrial fibrillation: Rates  controlled.  Continue beta-blocker and Eliquis.  Depression/IBS/fibromyalgia: Continue Effexor, Xanax, Sonata and Lyrica.  Obesity: BMI 31.6 twos kg/m2.  Weight loss encouraged.  DVT prophylaxis:    Code Status: Full Code  Family Communication: Alexis [name] at bedside.  Plan of care discussed with both of them.  Status is: Inpatient  Remains inpatient appropriate because:Inpatient level of care appropriate due to severity of illness  Dispo: The patient is from: Home              Anticipated d/c is to: Home              Patient currently is not medically stable to d/c.   Difficult to place patient No        Estimated body mass index is 30.07 kg/m as calculated from the following:   Height as of this encounter: '5\' 5"'$  (1.651 m).   Weight as of this encounter: 82 kg.     Nutritional Assessment: Body mass index is 30.07 kg/m.Marland Kitchen Seen by dietician.  I agree with the assessment and plan as outlined below: Nutrition Status: Nutrition Problem: Increased nutrient needs Etiology: chronic illness (CHF) Signs/Symptoms: estimated needs Interventions: MVI, Magic cup, Education  .  Skin Assessment: I have examined the patient's skin and I agree with the wound assessment as performed by the wound care RN as outlined below:    Consultants:  None  Procedures:  None  Antimicrobials:  Anti-infectives (From admission, onward)    None          Subjective: Patient seen and examined.  She is feeling better.  Alexis [name] at the bedside.  Objective: Vitals:  04/28/21 0520 04/28/21 0528 04/28/21 0733 04/28/21 1200  BP:  (!) 148/43 (!) 144/55 (!) 118/57  Pulse:  (!) 51 (!) 52   Resp:  '18 18 20  '$ Temp:  98 F (36.7 C) 98.2 F (36.8 C) 98.1 F (36.7 C)  TempSrc:  Oral Oral Oral  SpO2:  100% 100% 100%  Weight: 82 kg     Height:        Intake/Output Summary (Last 24 hours) at 04/28/2021 1319 Last data filed at 04/28/2021 0800 Gross per 24 hour  Intake 1130 ml  Output 2000 ml   Net -870 ml    Filed Weights   04/26/21 1629 04/27/21 0250 04/28/21 0520  Weight: 84.1 kg 83.1 kg 82 kg    Examination:  General exam: Appears calm and comfortable  Respiratory system: Clear to auscultation. Respiratory effort normal. Cardiovascular system: S1 & S2 heard, RRR. No JVD, murmurs, rubs, gallops or clicks.  Trace pitting edema bilateral lower extremity. Gastrointestinal system: Abdomen is nondistended, soft and nontender. No organomegaly or masses felt. Normal bowel sounds heard. Central nervous system: Alert and oriented. No focal neurological deficits. Extremities: Symmetric 5 x 5 power. Skin: No rashes, lesions or ulcers.  Psychiatry: Judgement and insight appear normal. Mood & affect appropriate.    Data Reviewed: I have personally reviewed following labs and imaging studies  CBC: Recent Labs  Lab 04/26/21 0858 04/27/21 0242  WBC 11.8* 8.4  HGB 10.3* 9.5*  HCT 34.1* 29.9*  MCV 80.8 78.1*  PLT 280 [id number]    Basic Metabolic Panel: Recent Labs  Lab 04/26/21 0858 04/27/21 0242 04/28/21 0447  NA 135 138 140  K 3.5 2.8* 4.4  CL 99 94* 99  CO2 '23 31 31  '$ GLUCOSE 285* 161* 138*  BUN '14 16 23  '$ CREATININE 0.76 0.86 0.83  CALCIUM 8.9 8.6* 9.0  MG  --  1.6* 2.2    GFR: Estimated Creatinine Clearance: 67.7 mL/min (by C-G formula based on SCr of 0.83 mg/dL). Liver Function Tests: Recent Labs  Lab 04/26/21 0949  AST 34  ALT 37  ALKPHOS 130*  BILITOT 1.2  PROT 6.8  ALBUMIN 2.9*    No results for input(s): LIPASE, AMYLASE in the last 168 hours. No results for input(s): AMMONIA in the last 168 hours. Coagulation Profile: No results for input(s): INR, PROTIME in the last 168 hours. Cardiac Enzymes: No results for input(s): CKTOTAL, CKMB, CKMBINDEX, TROPONINI in the last 168 hours. BNP (last 3 results) No results for input(s): PROBNP in the last 8760 hours. HbA1C: Recent Labs    04/26/21 1655  HGBA1C 8.3*    CBG: Recent Labs  Lab  04/27/21 1128 04/27/21 1546 04/27/21 2117 04/28/21 0643 04/28/21 1159  GLUCAP 280* 248* 232* 159* 185*    Lipid Profile: Recent Labs    04/26/21 1655  CHOL 169  HDL 51  LDLCALC 95  TRIG 113  CHOLHDL 3.3    Thyroid Function Tests: No results for input(s): TSH, T4TOTAL, FREET4, T3FREE, THYROIDAB in the last 72 hours. Anemia Panel: No results for input(s): VITAMINB12, FOLATE, FERRITIN, TIBC, IRON, RETICCTPCT in the last 72 hours. Sepsis Labs: No results for input(s): PROCALCITON, LATICACIDVEN in the last 168 hours.  Recent Results (from the past 240 hour(s))  Resp Panel by RT-PCR (Flu A&B, Covid) Nasopharyngeal Swab     Status: None   Collection Time: 04/26/21 10:11 AM   Specimen: Nasopharyngeal Swab; Nasopharyngeal(NP) swabs in vial transport medium  Result Value Ref Range Status   SARS  Coronavirus 2 by RT PCR NEGATIVE NEGATIVE Final    Comment: (NOTE) SARS-CoV-2 target nucleic acids are NOT DETECTED.  The SARS-CoV-2 RNA is generally detectable in upper respiratory specimens during the acute phase of infection. The lowest concentration of SARS-CoV-2 viral copies this assay can detect is 138 copies/mL. A negative result does not preclude SARS-Cov-2 infection and should not be used as the sole basis for treatment or other patient management decisions. A negative result may occur with  improper specimen collection/handling, submission of specimen other than nasopharyngeal swab, presence of viral mutation(s) within the areas targeted by this assay, and inadequate number of viral copies(<138 copies/mL). A negative result must be combined with clinical observations, patient history, and epidemiological information. The expected result is Negative.  Fact Sheet for Patients:  EntrepreneurPulse.com.au  Fact Sheet for Healthcare Providers:  IncredibleEmployment.be  This test is no t yet approved or cleared by the [place] FDA and   has been authorized for detection and/or diagnosis of SARS-CoV-2 by FDA under an Emergency Use Authorization (EUA). This EUA will remain  in effect (meaning this test can be used) for the duration of the COVID-19 declaration under Section 564(b)(1) of the Act, 21 U.S.C.section 360bbb-3(b)(1), unless the authorization is terminated  or revoked sooner.       Influenza A by PCR NEGATIVE NEGATIVE Final   Influenza B by PCR NEGATIVE NEGATIVE Final    Comment: (NOTE) The Xpert Xpress SARS-CoV-2/FLU/RSV plus assay is intended as an aid in the diagnosis of influenza from Nasopharyngeal swab specimens and should not be used as a sole basis for treatment. Nasal washings and aspirates are unacceptable for Xpert Xpress SARS-CoV-2/FLU/RSV testing.  Fact Sheet for Patients: EntrepreneurPulse.com.au  Fact Sheet for Healthcare Providers: IncredibleEmployment.be  This test is not yet approved or cleared by the [place] FDA and has been authorized for detection and/or diagnosis of SARS-CoV-2 by FDA under an Emergency Use Authorization (EUA). This EUA will remain in effect (meaning this test can be used) for the duration of the COVID-19 declaration under Section 564(b)(1) of the Act, 21 U.S.C. section 360bbb-3(b)(1), unless the authorization is terminated or revoked.  Performed at [facility], [place] [address], [place], [place] [address]        Radiology Studies: ECHOCARDIOGRAM COMPLETE  Result Date: 04/26/2021    ECHOCARDIOGRAM REPORT   Patient Name:   JENAYAH SONNEK Date of Exam: 04/26/2021 Medical Rec #:  [id number]         Height:       65.0 in Accession #:    [id number]        Weight:       190.0 lb Date of Birth:  01-Mar-1951         BSA:          1.935 m Patient Age:    54 years          BP:           149/70 mmHg Patient Gender: F                 HR:           65 bpm. Exam Location:  Inpatient Procedure: 2D Echo Indications:    acute  systolic chf  History:        Patient has prior history of Echocardiogram examinations, most                 recent 12/07/2020. Risk Factors:Hypertension, Dyslipidemia,  Diabetes and Sleep Apnea.  Sonographer:    Johny Chess RDCS Referring Phys: [id number] JENNIFER YATES  Sonographer Comments: Breast density and Image acquisition challenging due to patient body habitus. IMPRESSIONS  1. Left ventricular ejection fraction, by estimation, is 65 to 70%. The left ventricle has normal function. The left ventricle has no regional wall motion abnormalities.  2. Right ventricular systolic function is normal. The right ventricular size is normal. There is mildly elevated pulmonary artery systolic pressure.  3. Trivial mitral valve regurgitation.  4. The aortic valve is normal in structure. Aortic valve regurgitation is not visualized.  5. The inferior vena cava is normal in size with greater than 50% respiratory variability, suggesting right atrial pressure of 3 mmHg. FINDINGS  Left Ventricle: Left ventricular ejection fraction, by estimation, is 65 to 70%. The left ventricle has normal function. The left ventricle has no regional wall motion abnormalities. The left ventricular internal cavity size was normal in size. There is  no left ventricular hypertrophy. Right Ventricle: The right ventricular size is normal. Right vetricular wall thickness was not assessed. Right ventricular systolic function is normal. There is mildly elevated pulmonary artery systolic pressure. The tricuspid regurgitant velocity is 3.01 m/s, and with an assumed right atrial pressure of 3 mmHg, the estimated right ventricular systolic pressure is 123XX123 mmHg. Left Atrium: Left atrial size was normal in size. Right Atrium: Right atrial size was normal in size. Pericardium: There is no evidence of pericardial effusion. Mitral Valve: There is mild thickening of the mitral valve leaflet(s). There is mild calcification of the mitral valve  leaflet(s). Mild to moderate mitral annular calcification. Trivial mitral valve regurgitation. MV peak gradient, 7.6 mmHg. The mean mitral valve gradient is 4.0 mmHg. Tricuspid Valve: The tricuspid valve is normal in structure. Tricuspid valve regurgitation is trivial. Aortic Valve: The aortic valve is normal in structure. Aortic valve regurgitation is not visualized. Pulmonic Valve: The pulmonic valve was grossly normal. Pulmonic valve regurgitation is not visualized. Aorta: The aortic root and ascending aorta are structurally normal, with no evidence of dilitation. Venous: The inferior vena cava is normal in size with greater than 50% respiratory variability, suggesting right atrial pressure of 3 mmHg. IAS/Shunts: No atrial level shunt detected by color flow Doppler.  LEFT VENTRICLE PLAX 2D LVIDd:         4.40 cm  Diastology LVIDs:         2.70 cm  LV e' medial:    4.90 cm/s LV PW:         0.90 cm  LV E/e' medial:  25.1 LV IVS:        1.10 cm  LV e' lateral:   5.98 cm/s LVOT diam:     1.80 cm  LV E/e' lateral: 20.6 LV SV:         68 LV SV Index:   35 LVOT Area:     2.54 cm  RIGHT VENTRICLE            IVC RV S prime:     9.79 cm/s  IVC diam: 1.80 cm TAPSE (M-mode): 2.3 cm LEFT ATRIUM             Index       RIGHT ATRIUM           Index LA diam:        4.30 cm 2.22 cm/m  RA Area:     12.20 cm LA Vol (A2C):   73.6 ml 38.03 ml/m RA Volume:  26.10 ml  13.49 ml/m LA Vol (A4C):   58.9 ml 30.43 ml/m LA Biplane Vol: 67.0 ml 34.62 ml/m  AORTIC VALVE LVOT Vmax:   129.00 cm/s LVOT Vmean:  83.400 cm/s LVOT VTI:    0.268 m  AORTA Ao Root diam: 3.00 cm Ao Asc diam:  3.50 cm MITRAL VALVE                TRICUSPID VALVE MV Area (PHT): 4.49 cm     TR Peak grad:   36.2 mmHg MV Area VTI:   1.52 cm     TR Vmax:        301.00 cm/s MV Peak grad:  7.6 mmHg MV Mean grad:  4.0 mmHg     SHUNTS MV Vmax:       1.38 m/s     Systemic VTI:  0.27 m MV Vmean:      90.3 cm/s    Systemic Diam: 1.80 cm MV Decel Time: 169 msec MV E velocity:  123.00 cm/s MV A velocity: 113.00 cm/s MV E/A ratio:  1.09 Dorris Carnes MD Electronically signed by Dorris Carnes MD Signature Date/Time: 04/26/2021/4:05:27 PM    Final     Scheduled Meds:  ALPRAZolam  0.5 mg Oral QHS   apixaban  5 mg Oral BID   dapagliflozin propanediol  10 mg Oral Daily   docusate sodium  100 mg Oral BID   furosemide  40 mg Intravenous BID   insulin aspart  0-15 Units Subcutaneous TID WC   insulin aspart  0-5 Units Subcutaneous QHS   insulin glargine-yfgn  25 Units Subcutaneous Daily   irbesartan  300 mg Oral Daily   metoprolol succinate  100 mg Oral BID   multivitamin with minerals  1 tablet Oral Daily   pantoprazole  40 mg Oral Daily   pregabalin  50 mg Oral BID   sodium chloride flush  3 mL Intravenous Q12H   spironolactone  12.5 mg Oral Daily   venlafaxine XR  150 mg Oral BID   Continuous Infusions:   LOS: 2 days   Time spent: 30 minutes   Darliss Cheney, MD [facility]  04/28/2021, 1:19 PM  Please page via Shea [facility] and do not message via secure chat for anything urgent. Secure chat can be used for anything non urgent.  How to contact the [facility] Attending or Consulting provider [place] or covering provider during after hours [place], for this patient?  Check the care team in [facility] and look for a) attending/consulting [facility] provider listed and b) the [facility] team listed. Page or secure chat [phone]. Log into [email or web address] and use [place]'s universal password to access. If you do not have the password, please contact the hospital operator. Locate the [facility] provider you are looking for under [facility] and page to a number that you can be directly reached. If you still have difficulty reaching the provider, please page the [facility] (Director on Call) for the Hospitalists listed on amion for assistance.

## 2021-04-29 LAB — GLUCOSE, CAPILLARY: Glucose-Capillary: 113 mg/dL — ABNORMAL HIGH (ref 70–99)

## 2021-04-29 NOTE — Discharge Summary (Signed)
Physician Discharge Summary  Alexis [name] [id number] DOB: Apr 22, 1951 DOA: 04/26/2021  PCP: Deland Pretty, MD  Admit date: 04/26/2021 Discharge date: 04/29/2021 30 Day Unplanned Readmission Risk Score    Flowsheet Row ED to Hosp-Admission (Current) from 04/26/2021 in [place] [facility]  30 Day Unplanned Readmission Risk Score (%) 17.43 Filed at 04/29/2021 0801       This score is the patient's risk of an unplanned readmission within 30 days of being discharged (0 -100%). The score is based on dignosis, age, lab data, medications, orders, and past utilization.   Low:  0-14.9   Medium: 15-21.9   High: 22-29.9   Extreme: 30 and above          Admitted From: Home Disposition: Home  Recommendations for Outpatient Follow-up:  Follow up with PCP in 1-2 weeks Please obtain BMP/CBC in one week Please follow up with your PCP on the following pending results: Unresulted Labs (From admission, onward)    None         Home Health: None Equipment/Devices: None  Discharge Condition: Stable CODE STATUS: Full code Diet recommendation: Low-sodium  Subjective: Seen and examined.  Husband at bedside.  Feeling much better.  No shortness of breath.  Off of oxygen.  Excited to go home.  Brief/Interim Summary: Alexis [name] is a 70 y.o. female with medical history significant of chronic back pain; DM; fibromyalgia; HTN; HLD; IBS; OSA; obesity; and remote uterine cancer presented  with CP as well as orthopnea and high blood pressure and lower extremity edema. She had been scheduled for an appointment with cardiology on Tuesday and canceled it due to feeling poorly.  Upon arrival to ED, she was having acute hypoxic respiratory failure, chest x-ray showed congestive heart failure.  Admitted under hospitalist service with acute hypoxic respiratory failure secondary to acute on chronic diastolic congestive heart failure. BNP slightly elevated.  Echo shows normal ejection fraction.   Continued on Lasix IV 40 mg twice daily, has had significant diuresis.  She is off of oxygen since yesterday.  Also was saturating over 93% on room air with exertion today.  No edema on the lower extremities.  Ready to be discharged today.  It appears that her acute exacerbation of diastolic congestive heart failure is likely secondary to being noncompliant with her CPAP machine at night.  I have counseled her in length to be compliant with that.  I have advised her to see her cardiologist Dr. Debara Pickett for further advice about p.o. diuretics at home.  At this point in time, I am not discharging her on any diuretics.   Chest heaviness/pain: Troponin very slightly elevated but flat.  Echo with no wall motion abnormality.  Likely demand ischemia.   Essential hypertension: Very well controlled.  Norvasc on hold.  Continue Toprol XL and 1.   Hypokalemia: resolved.  Discharge Diagnoses:  Principal Problem:   New onset of congestive heart failure (HCC) Active Problems:   DM (diabetes mellitus), type 2, uncontrolled (HCC)   Hyperlipidemia   Anxiety and depression   Essential hypertension   Obesity (BMI 30-39.9)    Discharge Instructions   Allergies as of 04/29/2021       Reactions   Aspirin Other (See Comments)   Reaction: Vasculitis per MD   Codeine Itching, Rash   Tolerated Norco 12/07/20 with no reports of rash/itching. Patient endorsed allergy >30 yr ago and hasn't had reaction since.   Naproxen Nausea Only   Reaction: Makes stomach upset/irritated.  Sulfonamide Derivatives Itching, Rash        Medication List     TAKE these medications    acetaminophen 325 MG tablet Commonly known as: TYLENOL Take 2 tablets (650 mg total) by mouth every 6 (six) hours as needed for mild pain or moderate pain.   ALPRAZolam 0.5 MG tablet Commonly known as: XANAX Take 0.5 mg by mouth at bedtime.   amLODipine 5 MG tablet Commonly known as: NORVASC Take 5 mg by mouth every evening.    apixaban 5 MG Tabs tablet Commonly known as: ELIQUIS Take 1 tablet (5 mg total) by mouth 2 (two) times daily.   b complex vitamins tablet Take 1 tablet by mouth at bedtime.   Farxiga 10 MG Tabs tablet Generic drug: dapagliflozin propanediol Take 10 mg by mouth daily.   fish oil-omega-3 fatty acids 1000 MG capsule Take 1 g by mouth at bedtime.   insulin lispro 100 UNIT/ML KwikPen Commonly known as: HUMALOG Inject 8-9 Units into the skin 3 (three) times daily. Per sliding scale   metFORMIN 500 MG 24 hr tablet Commonly known as: GLUCOPHAGE-XR Take 500 mg by mouth daily.   metoprolol succinate 50 MG 24 hr tablet Commonly known as: TOPROL-XL Take 2 tablets (100 mg total) by mouth in the morning and at bedtime. Take with or immediately following a meal.   pregabalin 50 MG capsule Commonly known as: LYRICA Take 50 mg by mouth 2 (two) times daily.   RABEprazole 20 MG tablet Commonly known as: ACIPHEX Take 1 tablet (20 mg total) by mouth 2 (two) times daily.   tamsulosin 0.4 MG Caps capsule Commonly known as: FLOMAX Take 0.4 mg by mouth daily as needed (For prostate).   Tyler Aas FlexTouch 100 UNIT/ML FlexTouch Pen Generic drug: insulin degludec Inject 25 Units into the skin daily.   valsartan 320 MG tablet Commonly known as: DIOVAN Take 320 mg by mouth daily.   venlafaxine XR 150 MG 24 hr capsule Commonly known as: EFFEXOR-XR Take 150 mg by mouth 2 (two) times daily.   zaleplon 5 MG capsule Commonly known as: SONATA Take 5 mg by mouth at bedtime.        Follow-up Information     Deland Pretty, MD Follow up in 1 week(s).   Specialty: Internal Medicine Why: Please follow up in a week. Contact information: [address] [place] [address] [phone]         Pixie Casino, MD .   Specialty: Cardiology Contact information: [address] [place] [address] [place] [address] [phone]                Allergies  Allergen  Reactions   Aspirin Other (See Comments)    Reaction: Vasculitis per MD   Codeine Itching and Rash    Tolerated Norco 12/07/20 with no reports of rash/itching. Patient endorsed allergy >30 yr ago and hasn't had reaction since.   Naproxen Nausea Only    Reaction: Makes stomach upset/irritated.   Sulfonamide Derivatives Itching and Rash    Consultations: None   Procedures/Studies: DG Chest 2 View  Result Date: 04/26/2021 CLINICAL DATA:  Chest pain and shortness of breath. EXAM: CHEST - 2 VIEW COMPARISON:  01/29/2021 FINDINGS: The cardio pericardial silhouette is enlarged. There is pulmonary vascular congestion without overt pulmonary edema. Bibasilar atelectasis/infiltrate associated with small bilateral pleural effusions. Bones are diffusely demineralized. IMPRESSION: Enlargement of the cardiopericardial silhouette with pulmonary vascular congestion. Bibasilar atelectasis/infiltrate and small bilateral pleural effusions. Electronically Signed  By: Misty Stanley M.D.   On: 04/26/2021 09:20   ECHOCARDIOGRAM COMPLETE  Result Date: 04/26/2021    ECHOCARDIOGRAM REPORT   Patient Name:   BARI MOZIE Date of Exam: 04/26/2021 Medical Rec #:  [id number]         Height:       65.0 in Accession #:    [id number]        Weight:       190.0 lb Date of Birth:  1951/07/28         BSA:          1.935 m Patient Age:    70 years          BP:           149/70 mmHg Patient Gender: F                 HR:           65 bpm. Exam Location:  Inpatient Procedure: 2D Echo Indications:    acute systolic chf  History:        Patient has prior history of Echocardiogram examinations, most                 recent 12/07/2020. Risk Factors:Hypertension, Dyslipidemia,                 Diabetes and Sleep Apnea.  Sonographer:    Johny Chess RDCS Referring Phys: [id number] JENNIFER YATES  Sonographer Comments: Breast density and Image acquisition challenging due to patient body habitus. IMPRESSIONS  1. Left ventricular ejection  fraction, by estimation, is 65 to 70%. The left ventricle has normal function. The left ventricle has no regional wall motion abnormalities.  2. Right ventricular systolic function is normal. The right ventricular size is normal. There is mildly elevated pulmonary artery systolic pressure.  3. Trivial mitral valve regurgitation.  4. The aortic valve is normal in structure. Aortic valve regurgitation is not visualized.  5. The inferior vena cava is normal in size with greater than 50% respiratory variability, suggesting right atrial pressure of 3 mmHg. FINDINGS  Left Ventricle: Left ventricular ejection fraction, by estimation, is 65 to 70%. The left ventricle has normal function. The left ventricle has no regional wall motion abnormalities. The left ventricular internal cavity size was normal in size. There is  no left ventricular hypertrophy. Right Ventricle: The right ventricular size is normal. Right vetricular wall thickness was not assessed. Right ventricular systolic function is normal. There is mildly elevated pulmonary artery systolic pressure. The tricuspid regurgitant velocity is 3.01 m/s, and with an assumed right atrial pressure of 3 mmHg, the estimated right ventricular systolic pressure is 123XX123 mmHg. Left Atrium: Left atrial size was normal in size. Right Atrium: Right atrial size was normal in size. Pericardium: There is no evidence of pericardial effusion. Mitral Valve: There is mild thickening of the mitral valve leaflet(s). There is mild calcification of the mitral valve leaflet(s). Mild to moderate mitral annular calcification. Trivial mitral valve regurgitation. MV peak gradient, 7.6 mmHg. The mean mitral valve gradient is 4.0 mmHg. Tricuspid Valve: The tricuspid valve is normal in structure. Tricuspid valve regurgitation is trivial. Aortic Valve: The aortic valve is normal in structure. Aortic valve regurgitation is not visualized. Pulmonic Valve: The pulmonic valve was grossly normal. Pulmonic  valve regurgitation is not visualized. Aorta: The aortic root and ascending aorta are structurally normal, with no evidence of dilitation. Venous: The inferior vena cava is normal in size with greater  than 50% respiratory variability, suggesting right atrial pressure of 3 mmHg. IAS/Shunts: No atrial level shunt detected by color flow Doppler.  LEFT VENTRICLE PLAX 2D LVIDd:         4.40 cm  Diastology LVIDs:         2.70 cm  LV e' medial:    4.90 cm/s LV PW:         0.90 cm  LV E/e' medial:  25.1 LV IVS:        1.10 cm  LV e' lateral:   5.98 cm/s LVOT diam:     1.80 cm  LV E/e' lateral: 20.6 LV SV:         68 LV SV Index:   35 LVOT Area:     2.54 cm  RIGHT VENTRICLE            IVC RV S prime:     9.79 cm/s  IVC diam: 1.80 cm TAPSE (M-mode): 2.3 cm LEFT ATRIUM             Index       RIGHT ATRIUM           Index LA diam:        4.30 cm 2.22 cm/m  RA Area:     12.20 cm LA Vol (A2C):   73.6 ml 38.03 ml/m RA Volume:   26.10 ml  13.49 ml/m LA Vol (A4C):   58.9 ml 30.43 ml/m LA Biplane Vol: 67.0 ml 34.62 ml/m  AORTIC VALVE LVOT Vmax:   129.00 cm/s LVOT Vmean:  83.400 cm/s LVOT VTI:    0.268 m  AORTA Ao Root diam: 3.00 cm Ao Asc diam:  3.50 cm MITRAL VALVE                TRICUSPID VALVE MV Area (PHT): 4.49 cm     TR Peak grad:   36.2 mmHg MV Area VTI:   1.52 cm     TR Vmax:        301.00 cm/s MV Peak grad:  7.6 mmHg MV Mean grad:  4.0 mmHg     SHUNTS MV Vmax:       1.38 m/s     Systemic VTI:  0.27 m MV Vmean:      90.3 cm/s    Systemic Diam: 1.80 cm MV Decel Time: 169 msec MV E velocity: 123.00 cm/s MV A velocity: 113.00 cm/s MV E/A ratio:  1.09 Dorris Carnes MD Electronically signed by Dorris Carnes MD Signature Date/Time: 04/26/2021/4:05:27 PM    Final      Discharge Exam: Vitals:   04/28/21 1949 04/29/21 0331  BP: (!) 133/53 (!) 140/51  Pulse: (!) 59 (!) 55  Resp: 18 18  Temp: 98.3 F (36.8 C) 98.1 F (36.7 C)  SpO2: 93% 91%   Vitals:   04/28/21 1638 04/28/21 1949 04/29/21 0209 04/29/21 0331  BP: (!)  148/59 (!) 133/53  (!) 140/51  Pulse: (!) 56 (!) 59  (!) 55  Resp: '20 18  18  '$ Temp: 98 F (36.7 C) 98.3 F (36.8 C)  98.1 F (36.7 C)  TempSrc: Oral Oral  Oral  SpO2: 98% 93%  91%  Weight:   81.8 kg   Height:        General: Pt is alert, awake, not in acute distress Cardiovascular: RRR, S1/S2 +, no rubs, no gallops Respiratory: CTA bilaterally, no wheezing, no rhonchi Abdominal: Soft, NT, ND, bowel sounds + Extremities: no edema, no cyanosis  The results of significant diagnostics from this hospitalization (including imaging, microbiology, ancillary and laboratory) are listed below for reference.     Microbiology: Recent Results (from the past 240 hour(s))  Resp Panel by RT-PCR (Flu A&B, Covid) Nasopharyngeal Swab     Status: None   Collection Time: 04/26/21 10:11 AM   Specimen: Nasopharyngeal Swab; Nasopharyngeal(NP) swabs in vial transport medium  Result Value Ref Range Status   SARS Coronavirus 2 by RT PCR NEGATIVE NEGATIVE Final    Comment: (NOTE) SARS-CoV-2 target nucleic acids are NOT DETECTED.  The SARS-CoV-2 RNA is generally detectable in upper respiratory specimens during the acute phase of infection. The lowest concentration of SARS-CoV-2 viral copies this assay can detect is 138 copies/mL. A negative result does not preclude SARS-Cov-2 infection and should not be used as the sole basis for treatment or other patient management decisions. A negative result may occur with  improper specimen collection/handling, submission of specimen other than nasopharyngeal swab, presence of viral mutation(s) within the areas targeted by this assay, and inadequate number of viral copies(<138 copies/mL). A negative result must be combined with clinical observations, patient history, and epidemiological information. The expected result is Negative.  Fact Sheet for Patients:  EntrepreneurPulse.com.au  Fact Sheet for Healthcare Providers:   IncredibleEmployment.be  This test is no t yet approved or cleared by the [place] FDA and  has been authorized for detection and/or diagnosis of SARS-CoV-2 by FDA under an Emergency Use Authorization (EUA). This EUA will remain  in effect (meaning this test can be used) for the duration of the COVID-19 declaration under Section 564(b)(1) of the Act, 21 U.S.C.section 360bbb-3(b)(1), unless the authorization is terminated  or revoked sooner.       Influenza A by PCR NEGATIVE NEGATIVE Final   Influenza B by PCR NEGATIVE NEGATIVE Final    Comment: (NOTE) The Xpert Xpress SARS-CoV-2/FLU/RSV plus assay is intended as an aid in the diagnosis of influenza from Nasopharyngeal swab specimens and should not be used as a sole basis for treatment. Nasal washings and aspirates are unacceptable for Xpert Xpress SARS-CoV-2/FLU/RSV testing.  Fact Sheet for Patients: EntrepreneurPulse.com.au  Fact Sheet for Healthcare Providers: IncredibleEmployment.be  This test is not yet approved or cleared by the [place] FDA and has been authorized for detection and/or diagnosis of SARS-CoV-2 by FDA under an Emergency Use Authorization (EUA). This EUA will remain in effect (meaning this test can be used) for the duration of the COVID-19 declaration under Section 564(b)(1) of the Act, 21 U.S.C. section 360bbb-3(b)(1), unless the authorization is terminated or revoked.  Performed at [facility], [place] [address] [place],  [address]      Labs: BNP (last 3 results) Recent Labs    12/06/20 2124 12/09/20 0337 04/26/21 0949  BNP 220.0* 530.1* 0000000*   Basic Metabolic Panel: Recent Labs  Lab 04/26/21 0858 04/27/21 0242 04/28/21 0447  NA 135 138 140  K 3.5 2.8* 4.4  CL 99 94* 99  CO2 '23 31 31  '$ GLUCOSE 579FGE* 161* 138*  BUN '14 16 23  '$ CREATININE 0.76 0.86 0.83  CALCIUM 8.9 8.6* 9.0  MG  --  1.6* 2.2   Liver Function  Tests: Recent Labs  Lab 04/26/21 0949  AST 34  ALT 37  ALKPHOS 130*  BILITOT 1.2  PROT 6.8  ALBUMIN 2.9*   No results for input(s): LIPASE, AMYLASE in the last 168 hours. No results for input(s): AMMONIA in the last 168 hours. CBC: Recent Labs  Lab 04/26/21 0858 04/27/21 0242  WBC 11.8* 8.4  HGB 10.3* 9.5*  HCT 34.1* 29.9*  MCV 80.8 78.1*  PLT 280 276   Cardiac Enzymes: No results for input(s): CKTOTAL, CKMB, CKMBINDEX, TROPONINI in the last 168 hours. BNP: Invalid input(s): POCBNP CBG: Recent Labs  Lab 04/28/21 0643 04/28/21 1159 04/28/21 1645 04/28/21 2115 04/29/21 0600  GLUCAP 159* 185* 211* 198* 113*   D-Dimer No results for input(s): DDIMER in the last 72 hours. Hgb A1c Recent Labs    04/26/21 1655  HGBA1C 8.3*   Lipid Profile Recent Labs    04/26/21 1655  CHOL 169  HDL 51  LDLCALC 95  TRIG 113  CHOLHDL 3.3   Thyroid function studies No results for input(s): TSH, T4TOTAL, T3FREE, THYROIDAB in the last 72 hours.  Invalid input(s): FREET3 Anemia work up No results for input(s): VITAMINB12, FOLATE, FERRITIN, TIBC, IRON, RETICCTPCT in the last 72 hours. Urinalysis    Component Value Date/Time   COLORURINE YELLOW 12/07/2020 1337   APPEARANCEUR CLEAR 12/07/2020 1337   LABSPEC 1.020 12/07/2020 1337   PHURINE 5.0 12/07/2020 1337   GLUCOSEU >=500 (A) 12/07/2020 1337   HGBUR SMALL (A) 12/07/2020 1337   BILIRUBINUR NEGATIVE 12/07/2020 1337   KETONESUR 5 (A) 12/07/2020 1337   PROTEINUR NEGATIVE 12/07/2020 1337   UROBILINOGEN 0.2 02/09/2014 1603   NITRITE NEGATIVE 12/07/2020 1337   LEUKOCYTESUR SMALL (A) 12/07/2020 1337   Sepsis Labs Invalid input(s): PROCALCITONIN,  WBC,  LACTICIDVEN Microbiology Recent Results (from the past 240 hour(s))  Resp Panel by RT-PCR (Flu A&B, Covid) Nasopharyngeal Swab     Status: None   Collection Time: 04/26/21 10:11 AM   Specimen: Nasopharyngeal Swab; Nasopharyngeal(NP) swabs in vial transport medium  Result  Value Ref Range Status   SARS Coronavirus 2 by RT PCR NEGATIVE NEGATIVE Final    Comment: (NOTE) SARS-CoV-2 target nucleic acids are NOT DETECTED.  The SARS-CoV-2 RNA is generally detectable in upper respiratory specimens during the acute phase of infection. The lowest concentration of SARS-CoV-2 viral copies this assay can detect is 138 copies/mL. A negative result does not preclude SARS-Cov-2 infection and should not be used as the sole basis for treatment or other patient management decisions. A negative result may occur with  improper specimen collection/handling, submission of specimen other than nasopharyngeal swab, presence of viral mutation(s) within the areas targeted by this assay, and inadequate number of viral copies(<138 copies/mL). A negative result must be combined with clinical observations, patient history, and epidemiological information. The expected result is Negative.  Fact Sheet for Patients:  EntrepreneurPulse.com.au  Fact Sheet for Healthcare Providers:  IncredibleEmployment.be  This test is no t yet approved or cleared by the [place] FDA and  has been authorized for detection and/or diagnosis of SARS-CoV-2 by FDA under an Emergency Use Authorization (EUA). This EUA will remain  in effect (meaning this test can be used) for the duration of the COVID-19 declaration under Section 564(b)(1) of the Act, 21 U.S.C.section 360bbb-3(b)(1), unless the authorization is terminated  or revoked sooner.       Influenza A by PCR NEGATIVE NEGATIVE Final   Influenza B by PCR NEGATIVE NEGATIVE Final    Comment: (NOTE) The Xpert Xpress SARS-CoV-2/FLU/RSV plus assay is intended as an aid in the diagnosis of influenza from Nasopharyngeal swab specimens and should not be used as a sole basis for treatment. Nasal washings and aspirates are unacceptable for Xpert Xpress SARS-CoV-2/FLU/RSV testing.  Fact Sheet for  Patients: EntrepreneurPulse.com.au  Fact Sheet  for Healthcare Providers: IncredibleEmployment.be  This test is not yet approved or cleared by the [place] and has been authorized for detection and/or diagnosis of SARS-CoV-2 by FDA under an Emergency Use Authorization (EUA). This EUA will remain in effect (meaning this test can be used) for the duration of the COVID-19 declaration under Section 564(b)(1) of the Act, 21 U.S.C. section 360bbb-3(b)(1), unless the authorization is terminated or revoked.  Performed at [facility], [place] [address], [place], [place] [address]      Time coordinating discharge: Over 30 minutes  SIGNED:   Darliss Cheney, MD  [facility] 04/29/2021, 10:07 AM  If 7PM-7AM, please contact night-coverage [email or web address]

## 2021-04-29 NOTE — Progress Notes (Signed)
SATURATION QUALIFICATIONS: (This note is used to comply with regulatory documentation for home oxygen)  Patient Saturations on Room Air at Rest = 96%  Patient Saturations on Room Air while Ambulating = 93%    Please briefly explain why patient needs home oxygen: 

## 2021-04-30 ENCOUNTER — Encounter: Payer: Self-pay | Admitting: Internal Medicine

## 2021-04-30 NOTE — Telephone Encounter (Signed)
error 

## 2021-05-14 ENCOUNTER — Ambulatory Visit (INDEPENDENT_AMBULATORY_CARE_PROVIDER_SITE_OTHER): Payer: Medicare Other | Admitting: Internal Medicine

## 2021-05-14 ENCOUNTER — Other Ambulatory Visit: Payer: Self-pay

## 2021-05-14 VITALS — BP 138/70 | HR 60 | Ht 65.0 in | Wt 176.0 lb

## 2021-05-14 DIAGNOSIS — I5031 Acute diastolic (congestive) heart failure: Secondary | ICD-10-CM | POA: Diagnosis not present

## 2021-05-14 DIAGNOSIS — I480 Paroxysmal atrial fibrillation: Secondary | ICD-10-CM | POA: Diagnosis not present

## 2021-05-14 DIAGNOSIS — Z79899 Other long term (current) drug therapy: Secondary | ICD-10-CM | POA: Diagnosis not present

## 2021-05-14 MED ORDER — AMIODARONE HCL 200 MG PO TABS: 200.0000 mg | ORAL_TABLET | Freq: Every day | ORAL | 3 refills | Status: DC

## 2021-05-14 MED ORDER — FUROSEMIDE 20 MG PO TABS: 20.0000 mg | ORAL_TABLET | Freq: Every day | ORAL | 3 refills | Status: DC

## 2021-05-14 NOTE — Patient Instructions (Signed)
Medication Instructions:  START lasix 20mg  daily DECREASE amiodarone to 200mg  once daily  *If you need a refill on your cardiac medications before your next appointment, please call your pharmacy*   Lab Work: FASTING lab work in 6 months (complete before your next appointment) -- lipid panel, CMET, TSH  If you have labs (blood work) drawn today and your tests are completely normal, you will receive your results only by: [place] (if you have MyChart) OR A paper copy in the mail If you have any lab test that is abnormal or we need to change your treatment, we will call you to review the results.   Testing/Procedures: NONE   Follow-Up: At [facility], you and your health needs are our priority.  As part of our continuing mission to provide you with exceptional heart care, we have created designated Provider Care Teams.  These Care Teams include your primary Cardiologist (physician) and Advanced Practice Providers (APPs -  Physician Assistants and Nurse Practitioners) who all work together to provide you with the care you need, when you need it.  We recommend signing up for the patient portal called "MyChart".  Sign up information is provided on this After Visit Summary.  MyChart is used to connect with patients for Virtual Visits (Telemedicine).  Patients are able to view lab/test results, encounter notes, upcoming appointments, etc.  Non-urgent messages can be sent to your provider as well.   To learn more about what you can do with MyChart, go to [email or web address].    Your next appointment:   November 02, 2021
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
## 2021-06-05 ENCOUNTER — Encounter: Payer: Self-pay | Admitting: Gastroenterology

## 2021-07-03 ENCOUNTER — Encounter: Payer: Self-pay | Admitting: Gastroenterology

## 2021-07-03 ENCOUNTER — Telehealth: Payer: Self-pay

## 2021-07-03 ENCOUNTER — Ambulatory Visit (INDEPENDENT_AMBULATORY_CARE_PROVIDER_SITE_OTHER): Payer: Medicare Other | Admitting: Gastroenterology

## 2021-07-03 VITALS — BP 130/66 | HR 60 | Ht 65.0 in | Wt 183.0 lb

## 2021-07-03 DIAGNOSIS — Z7902 Long term (current) use of antithrombotics/antiplatelets: Secondary | ICD-10-CM

## 2021-07-03 DIAGNOSIS — I480 Paroxysmal atrial fibrillation: Secondary | ICD-10-CM | POA: Diagnosis not present

## 2021-07-03 DIAGNOSIS — R194 Change in bowel habit: Secondary | ICD-10-CM | POA: Diagnosis not present

## 2021-07-03 MED ORDER — PEG 3350-KCL-NA BICARB-NACL 420 G PO SOLR: 4000.0000 mL | Freq: Once | ORAL | 0 refills | Status: AC

## 2021-07-03 NOTE — Telephone Encounter (Signed)
  Request for surgical clearance:     Endoscopy Procedure  What type of surgery is being performed?     Colonoscopy   When is this surgery scheduled?     08/22/21  What type of clearance is required ?   Pharmacy  Are there any medications that need to be held prior to surgery and how long? Eliquis x2 days   Practice name and name of physician performing surgery?      [facility]-Dr Danis   What is your office phone and fax number?      Phone- [phone]  [phone]  Anesthesia type (None, local, MAC, general) ?       MAC

## 2021-07-03 NOTE — Telephone Encounter (Signed)
    Patient Name: Alexis [name]  DOB: Jan 13, 1951 MRN: [id number]  Primary Cardiologist: Pixie Casino, MD  Chart reviewed as part of pre-operative protocol coverage. Patient has a colonoscopy scheduled for 08/22/2021 and we were asked to give our recommendations for holding Eliquis. Per Pharmacy and office protocol: "Patient can hold Eliquis for 2 days prior to procedure. Patient will not need bridging with Lovenox (enoxaparin) around procedure." Please restart Eliquis as soon as safely possible following procedure.   I will route this recommendation to the requesting party via Epic fax function and remove from pre-op pool.  Please call with questions.  Darreld Mclean, PA-C 07/03/2021, 7:22 PM

## 2021-07-03 NOTE — Telephone Encounter (Signed)
Pharmacy, can you please comment on how long Eliquis can be held for upcoming procedure?  Thank you! 

## 2021-07-03 NOTE — Telephone Encounter (Signed)
Patient with diagnosis of atrial fibrillation on Eliquis for anticoagulation.    Procedure: colonoscopy Date of procedure: 08/22/21   CHA2DS2-VASc Score = 6   This indicates a 9.7% annual risk of stroke. The patient's score is based upon: CHF History: 1 HTN History: 1 Diabetes History: 1 Stroke History: 0 Vascular Disease History: 1 Age Score: 1 Gender Score: 1  CrCl 68 (with adjusted body weight) Platelet count 276  Per office protocol, patient can hold Eliquis for 2 days prior to procedure.   Patient will not need bridging with Lovenox (enoxaparin) around procedure.

## 2021-07-03 NOTE — Patient Instructions (Signed)
We have sent the following medications to your pharmacy for you to pick up at your convenience:  Golytely   You will be contaced by our office prior to your procedure for directions on holding your Eliquis. If you do not hear from our office 1 week prior to your scheduled procedure, please call [phone] to discuss.  If you are age 69 or older, your body mass index should be between 23-30. Your Body mass index is 30.45 kg/m. If this is out of the aforementioned range listed, please consider follow up with your Primary Care Provider.  You have been scheduled for a colonoscopy. Please follow written instructions given to you at your visit today.  Please pick up your prep supplies at the pharmacy within the next 1-3 days. If you use inhalers (even only as needed), please bring them with you on the day of your procedure.   The [facility] providers would like to encourage you to use [facility] to communicate with providers for non-urgent requests or questions.  Due to long hold times on the telephone, sending your provider a message by [facility] may be a faster and more efficient way to get a response.  Please allow 48 business hours for a response.  Please remember that this is for non-urgent requests.   Thank you for choosing me and [facility].  Dr.Danis

## 2021-07-03 NOTE — Progress Notes (Signed)
[facility] Consult Note:  History: Alexis [name] 07/03/2021  Referring provider: Deland Pretty, MD  Reason for consult/chief complaint: Colonoscopy, Bloated, Diarrhea (Having issues with needing to go to the bathroom right after eating, especially after lunch. Sometimes having to go starlight to the bathroom as soon as she has finished eating and spending a lot of time in the bathroom. She has started putting on a diaper to prevent an accident if going out to eat.), and Abdominal Pain (Sometimes having lower abdominal pains.)   Subjective  HPI: From my 09/02/2016 office consult note: "This is a 70 year old woman last seen by Dr. Olevia [name] in June 2015. At that time she was diagnosed with diabetic related gastroparesis and given a trial of metoclopramide. The patient cannot recall whether or not it helped her when she stopped it. She was also treated empirically for bacterial overgrowth with Flagyl because she was describing diarrhea. Upper endoscopy in 2013 showed nonspecific gastritis and biopsies negative for H. pylori. She did not have esophagitis or Barrett's esophagus. She has remained on chronic once daily AcipHex to control her heartburn. Over the last several months she has had small pellet-like stools with no rectal bleeding.  Last colonoscopy in 2012 was unremarkable. Also, for the last few years she has had intermittent episodes of right lower anterior chest/RUQ pain that might radiate around to the back. Has no clear relation to meals, time of day, position or bowel movement. ___________ A multitude of GI symptoms. It seems like her gastroparesis has been under fairly good control, though she does report dietary indiscretions such as a Egg and bacon croissant for breakfast this morning. Her is likely related to medicine side effect, diabetes, diet, and physical activity. Her pain is musculoskeletal and probably related to fibromyalgia. It does not seem GI in  origin from the history or exam. Abnormal LFTs appear to be consistent with known fatty liver, her pain does not sound like retained CBD stone.     Plan: LFTs were obtained after the office visit today, and are resulted above. Gastroparesis diet information given Instructions for stool softener and MiraLAX given" ___________________________   Alexis [name] was here to discuss difficulty with her bowel habits.  When I last saw her, she had alert no symptoms related to gastroparesis, but was struggling with years of chronic constipation.  For about the last 2 years she has had a change in bowel habits with frequent postprandial urgency for stool that is loose and watery or sometimes small pieces "like worms". She has bloating and gas that she find socially embarrassing, and has had a few episodes of incontinence if she could not get to the bathroom on time with these episodes of urgency after meals.  She tells me that she had a hip replacement after fracture earlier this year and has recovered well from that.  She also feels that her breathing is back to normal after the hospital stay and her follow-up with cardiology.  She still does not consistently use her CPAP.  ROS:  Review of Systems  Constitutional:  Positive for fatigue. Negative for appetite change and unexpected weight change.  HENT:  Negative for mouth sores and voice change.   Eyes:  Negative for pain and redness.  Respiratory:  Negative for cough and shortness of breath.   Cardiovascular:  Negative for chest pain and palpitations.  Genitourinary:  Negative for dysuria and hematuria.  Musculoskeletal:  Positive for arthralgias. Negative for myalgias.  Skin:  Negative for pallor  and rash.  Neurological:  Negative for weakness and headaches.  Hematological:  Negative for adenopathy.    Past Medical History: Past Medical History:  Diagnosis Date   Anxiety    Arthritis    "knees" (05/25/2014)   Chronic back pain    Depression     Diabetes mellitus type II    Diabetic peripheral neuropathy ([place]) 10/20/2018   Diverticulitis    Fatty liver    Fibromyalgia    Gastric polyp    hyperplastic   Gastroparesis    "recently dx'd" (05/25/2014)   GERD (gastroesophageal reflux disease)    H/O hiatal hernia    Hx of gastritis    Hyperlipidemia    Hypertension    Irritable bowel syndrome (IBS)    Memory difficulties 09/18/2017   Migraine without aura, without mention of intractable migraine without mention of status migrainosus    "related to allergies; have them in the spring and fall" (05/25/2014)   Obesity    Sleep apnea    "wore mask; took it off in my sleep; quit wearing it" (05/25/2014)   Uterine cancer ([place]) dx'd 2000   surg only   Vasculitis ([place])    "irritates my legs"   Wears glasses    Hospitalization mid-September for respiratory distress due to acute on chronic diastolic heart failure and noncompliance with CPAP.  Echocardiogram showed normal LVEF.  Discharged home off oxygen after diuresis.  From Alexis [name] 05/14/2021 office follow-up note: "Alexis [name] returns today for follow-up.  She was recently hospitalized for acute respiratory failure a couple of weeks ago with signs of presumed diastolic congestive heart failure.  Her BNP was slightly elevated but her echo showed normal EF.  She was diuresed on Lasix 40 mg twice daily however was not discharged on any Lasix.  She was reportedly noncompliant with CPAP at night.  Continue diuretic therapy was deferred to follow-up with me.  Small mild elevation in troponin was noted but this was flat suggestive of demand ischemia.  Today she says she feels well.  She reports her weight has been fairly stable.  She does have a small amount of peripheral edema.  ________________ 1.    Alexis [name] presents today for follow-up of recent hospitalization at which time she was apparently volume overloaded and found to have acute diastolic congestive heart failure.  Echo showed  normal LVEF without comment on diastolic function.  She did diurese significantly with improvement in her breathing.  I do think she is at risk for recurrent edema and does have a little edema on exam today.  Would advise starting Lasix 20 mg daily.  As mentioned above she had diffuse nonobstructive coronary disease by CT but had a high calcium score of 469 back in April 2022.  We will need to continue aggressive medical therapy.  She was started on amiodarone 200 mg twice daily back in May but has never decreased the dose.  I advised her to cut that back to 200 mg daily.  She appears to be maintaining sinus rhythm.  Continue Toprol and apixaban.   Follow-up in 6 months or sooner as necessary.  We will check labs at that time including a lipid profile, c-Met and TSH given amiodarone therapy"    Past Surgical History: Past Surgical History:  Procedure Laterality Date   ANTERIOR CERVICAL DECOMPRESSION/DISCECTOMY FUSION 4 LEVELS N/A 05/07/2018   Procedure: ANTERIOR CERVICAL DECOMPRESSION FUSION, CERVICAL 4-5,CERVICAL 5-6, CERVICAL 6-7 WITH INSTRUMENTATION AND ALLOGRAFT;  Surgeon: Phylliss Bob, MD;  Location: [facility];  Service: Orthopedics;  Laterality: N/A;   APPENDECTOMY  ~ [place]   LEFT HEART CATHETERIZATION WITH CORONARY ANGIOGRAM N/A 05/26/2014   Procedure: LEFT HEART CATHETERIZATION WITH CORONARY ANGIOGRAM;  Surgeon: Troy Sine, MD;  Location[facility] CATH LAB;  Service: Cardiovascular;  Laterality: N/A;   SINUS SURGERY WITH INSTATRAK  2000   TOTAL HIP ARTHROPLASTY Left 12/05/2020   Procedure: TOTAL HIP ARTHROPLASTY ANTERIOR APPROACH;  Surgeon: Rod Can, MD;  Location: [facility];  Service: Orthopedics;  Laterality: Left;   TUBAL LIGATION  ~ 1982   VAGINAL HYSTERECTOMY  2000     Family History: Family History  Problem Relation Age of Onset   Breast cancer Sister    Colon polyps Father    Heart disease  Father    Colon cancer Maternal Uncle    Ovarian cancer Maternal Aunt    Stomach cancer Maternal Aunt    Diabetes Maternal Aunt    Heart disease Maternal Uncle    Heart disease Other        Grandparents   Irritable bowel syndrome Daughter     Social History: Social History   Socioeconomic History   Marital status: Married    Spouse name: Tyra Gural   Number of children: 2   Years of education: Not on file   Highest education level: Some college, no degree  Occupational History   Occupation: Retired    Fish farm manager: RETIRED  Tobacco Use   Smoking status: Never   Smokeless tobacco: Never  Vaping Use   Vaping Use: Never used  Substance and Sexual Activity   Alcohol use: No   Drug use: No   Sexual activity: Not Currently  Other Topics Concern   Not on file  Social History Narrative   Daily Caffeine, Coke   Social Determinants of Health   Financial Resource Strain: Low Risk    Difficulty of Paying Living Expenses: Not very hard  Food Insecurity: No Food Insecurity   Worried About Charity fundraiser in the Last Year: Never true   [place] in the Last Year: Never true  Transportation Needs: No Transportation Needs   Lack of Transportation (Medical): No   Lack of Transportation (Non-Medical): No  Physical Activity: Not on file  Stress: Not on file  Social Connections: Not on file    Allergies: Allergies  Allergen Reactions   Aspirin Other (See Comments)    Reaction: Vasculitis per MD   Codeine Itching and Rash    Tolerated Norco 12/07/20 with no reports of rash/itching. Patient endorsed allergy >30 yr ago and hasn't had reaction since.   Naproxen Nausea Only    Reaction: Makes stomach upset/irritated.   Sulfonamide Derivatives Itching and Rash    Outpatient Meds: Current Outpatient Medications  Medication Sig Dispense Refill   acetaminophen (TYLENOL) 325 MG tablet Take 2 tablets (650 mg total) by mouth every 6 (six) hours as needed for mild pain or  moderate pain.     ALPRAZolam (XANAX) 0.5 MG tablet Take 0.5 mg by mouth at bedtime.     amiodarone (PACERONE) 200 MG tablet Take 1 tablet (200 mg total) by mouth daily. 90 tablet 3   amLODipine (NORVASC) 5 MG tablet Take 5 mg by mouth every evening.     apixaban (ELIQUIS) 5 MG TABS tablet Take 1 tablet (5 mg total) by mouth 2 (two) times  daily. 60 tablet    b complex vitamins tablet Take 1 tablet by mouth at bedtime.     FARXIGA 10 MG TABS tablet Take 10 mg by mouth daily.     fish oil-omega-3 fatty acids 1000 MG capsule Take 1 g by mouth at bedtime.      furosemide (LASIX) 20 MG tablet Take 1 tablet (20 mg total) by mouth daily. 90 tablet 3   insulin lispro (HUMALOG) 100 UNIT/ML KwikPen Inject 8-9 Units into the skin 3 (three) times daily. Per sliding scale     metFORMIN (GLUCOPHAGE-XR) 500 MG 24 hr tablet Take 500 mg by mouth daily.     metoprolol succinate (TOPROL-XL) 50 MG 24 hr tablet Take 2 tablets (100 mg total) by mouth in the morning and at bedtime. Take with or immediately following a meal.     polyethylene glycol-electrolytes (NULYTELY) 420 g solution Take 4,000 mLs by mouth once for 1 dose. 4000 mL 0   pregabalin (LYRICA) 50 MG capsule Take 50 mg by mouth 2 (two) times daily.     RABEprazole (ACIPHEX) 20 MG tablet Take 1 tablet (20 mg total) by mouth 2 (two) times daily. 60 tablet 3   tamsulosin (FLOMAX) 0.4 MG CAPS capsule Take 0.4 mg by mouth daily as needed (For prostate).  3   TRESIBA FLEXTOUCH 100 UNIT/ML FlexTouch Pen Inject 25 Units into the skin daily.     TRULICITY 1.5 SJ/6.2EZ SOPN Inject into the skin once a week.     valsartan (DIOVAN) 320 MG tablet Take 320 mg by mouth daily.     venlafaxine XR (EFFEXOR-XR) 150 MG 24 hr capsule Take 150 mg by mouth 2 (two) times daily.     zaleplon (SONATA) 5 MG capsule Take 5 mg by mouth at bedtime.     No current facility-administered medications for this visit.       ___________________________________________________________________ Objective   Exam:  BP 130/66   Pulse 60   Ht '5\' 5"'  (1.651 m)   Wt 183 lb (83 kg)   BMI 30.45 kg/m  Wt Readings from Last 3 Encounters:  07/03/21 183 lb (83 kg)  05/14/21 176 lb (79.8 kg)  04/29/21 180 lb 4.8 oz (81.8 kg)    General: Well-appearing, pleasant and conversational.  Breathing comfortably on room air.  Antalgic gait Eyes: sclera anicteric, no redness ENT: oral mucosa moist without lesions, no cervical or supraclavicular lymphadenopathy CV: RRR without murmur, S1/S2, no JVD, no peripheral edema Resp: clear to auscultation bilaterally, normal RR and effort noted GI: soft, mild bandlike upper abdominal wall tenderness, with active bowel sounds. No guarding or palpable organomegaly noted. Skin; warm and dry, no rash or jaundice noted Neuro: awake, alert and oriented x 3. Normal gross motor function and fluent speech  Labs:  CBC Latest Ref Rng & Units 04/27/2021 04/26/2021 12/12/2020  WBC 4.0 - 10.5 K/uL 8.4 11.8(H) 10.1  Hemoglobin 12.0 - 15.0 g/dL 9.5(L) 10.3(L) 9.4(L)  Hematocrit 36.0 - 46.0 % 29.9(L) 34.1(L) 29.0(L)  Platelets 150 - 400 K/uL 276 280 281   CMP Latest Ref Rng & Units 04/28/2021 04/27/2021 04/26/2021  Glucose 70 - 99 mg/dL 138(H) 161(H) 285(H)  BUN 8 - 23 mg/dL '23 16 14  ' Creatinine 0.44 - 1.00 mg/dL 0.83 0.86 0.76  Sodium 135 - 145 mmol/L 140 138 135  Potassium 3.5 - 5.1 mmol/L 4.4 2.8(L) 3.5  Chloride 98 - 111 mmol/L 99 94(L) 99  CO2 22 - 32 mmol/L '31 31 23  ' Calcium  8.9 - 10.3 mg/dL 9.0 8.6(L) 8.9  Total Protein 6.5 - 8.1 g/dL - - 6.8  Total Bilirubin 0.3 - 1.2 mg/dL - - 1.2  Alkaline Phos 38 - 126 U/L - - 130(H)  AST 15 - 41 U/L - - 34  ALT 0 - 44 U/L - - 37   Hemoglobin A1c on 04/26/2021, and 10.3 on 12/04/2020  04/26/2021 echocardiogram report  1. Left ventricular ejection fraction, by estimation, is 65 to 70%. The  left ventricle has normal function. The left  ventricle has no regional  wall motion abnormalities.   2. Right ventricular systolic function is normal. The right ventricular  size is normal. There is mildly elevated pulmonary artery systolic  pressure.   3. Trivial mitral valve regurgitation.   4. The aortic valve is normal in structure. Aortic valve regurgitation is  not visualized.   5. The inferior vena cava is normal in size with greater than 50%  respiratory variability, suggesting right atrial pressure of 3 mmHg   Assessment: Encounter Diagnoses  Name Primary?   Change in bowel habits Yes   Paroxysmal atrial fibrillation (HCC)    Long term (current) use of antithrombotics/antiplatelets     Chronic alteration of bowel habits, previously constipation but in the last couple of years urgency with lower abdominal cramps and often loose stool with bloating and gas.  She has a history of IBS, and the symptoms are consistent with that. She is also had a history of gastroparesis that has been under good control in recent years with dietary measures and no metoclopramide. Relatively poor control diabetes, increasing risk for SIBO. Right age group for microscopic colitis  Plan:  Colonoscopy to evaluate altered bowel habits, biopsy to rule out microscopic colitis, and also overdue for colon cancer screening. She was agreeable after discussion of procedure and risks.  The benefits and risks of the planned procedure were described in detail with the patient or (when appropriate) their health care proxy.  Risks were outlined as including, but not limited to, bleeding, infection, perforation, adverse medication reaction leading to cardiac or pulmonary decompensation, pancreatitis (if ERCP).  The limitation of incomplete mucosal visualization was also discussed.  No guarantees or warranties were given.  Patient at increased risk for cardiopulmonary complications of procedure due to medical comorbidities.  Hold Eliquis about 36 hours prior  to procedure (last dose 2 evenings prior to procedure).  She is aware of the small but real risk of stroke during a brief Eliquis hold.  We will consult with her cardiologist, and I expect that we will be agreeable.  If no microscopic colitis, will most likely give empiric trial of rifaximin.  Thank you for the courtesy of this consult.  Please call me with any questions or concerns.  Nelida Meuse III  CC: Referring provider noted above

## 2021-07-23 ENCOUNTER — Telehealth: Payer: Self-pay | Admitting: Internal Medicine

## 2021-07-23 NOTE — Telephone Encounter (Signed)
Patient has been informed okay to hold Eliquis for 2 days prior to procedure. Pt voiced understanding.

## 2021-07-23 NOTE — Telephone Encounter (Signed)
-----   Message from Pixie Casino, MD sent at 07/18/2021  3:39 PM EST ----- Regarding: RE: Atorvastatin discontinuation Apparently this internist/endocrinologist at [facility] either stopped it or changed it - Madelin Rear, MD. Would reach out to her.  I would recommend she restart the atorvastatin. I believe she tolerated it well.  Dr. Debara Pickett  ----- Message ----- From: Leeroy Bock, RPH-CPP Sent: 07/18/2021  12:32 PM EST To: Pixie Casino, MD, Fidel Levy, RN, # Subject: RE: Atorvastatin discontinuation               Shona Simpson,  I haven't seen this pt before but am cc'ing Dr Debara Pickett and his RN, hoping they can clarify statin use.  Thanks, Jinny Blossom ----- Message ----- From: Reed Breech, [facility] Sent: 07/16/2021   2:55 PM EST To: Leeroy Bock, RPH-CPP Subject: Atorvastatin discontinuation                   Hi Megan,   I hope your'e well. I was hoping you could help me with this patient. She has DM & ASCVD and has atorvastatin on file but is not filling it.   In her PCP's EMR, it's still listed on her active medication list. In Dr. Lysbeth Penner Oct. Note, it's also mentioned that she's still taking it but it looks like it was d/c'd in June on her epic list.  Can you help me figure out what happened with her atorvastatin rx? The patient was confused as well.   Thanks,  Reed Breech, [place] [phone]

## 2021-07-23 NOTE — Telephone Encounter (Signed)
Left message to call back in regards to message as noted below MyChart message sent

## 2021-07-24 ENCOUNTER — Other Ambulatory Visit: Payer: Self-pay

## 2021-07-24 ENCOUNTER — Inpatient Hospital Stay (HOSPITAL_COMMUNITY)
Admission: EM | Admit: 2021-07-24 | Discharge: 2021-07-29 | DRG: 291 | Disposition: A | Discharge status: Home / Self Care | Payer: Medicare Other | Attending: Internal Medicine | Admitting: Internal Medicine

## 2021-07-24 ENCOUNTER — Emergency Department (HOSPITAL_COMMUNITY): Payer: Medicare Other

## 2021-07-24 ENCOUNTER — Encounter (HOSPITAL_COMMUNITY): Payer: Self-pay | Admitting: Emergency Medicine

## 2021-07-24 DIAGNOSIS — E669 Obesity, unspecified: Secondary | ICD-10-CM | POA: Diagnosis present

## 2021-07-24 DIAGNOSIS — G4733 Obstructive sleep apnea (adult) (pediatric): Secondary | ICD-10-CM | POA: Diagnosis present

## 2021-07-24 DIAGNOSIS — I480 Paroxysmal atrial fibrillation: Secondary | ICD-10-CM | POA: Diagnosis present

## 2021-07-24 DIAGNOSIS — Z6830 Body mass index (BMI) 30.0-30.9, adult: Secondary | ICD-10-CM

## 2021-07-24 DIAGNOSIS — Z96642 Presence of left artificial hip joint: Secondary | ICD-10-CM | POA: Diagnosis present

## 2021-07-24 DIAGNOSIS — J9601 Acute respiratory failure with hypoxia: Secondary | ICD-10-CM

## 2021-07-24 DIAGNOSIS — I2510 Atherosclerotic heart disease of native coronary artery without angina pectoris: Secondary | ICD-10-CM | POA: Diagnosis present

## 2021-07-24 DIAGNOSIS — M7989 Other specified soft tissue disorders: Secondary | ICD-10-CM | POA: Diagnosis not present

## 2021-07-24 DIAGNOSIS — Z20822 Contact with and (suspected) exposure to covid-19: Secondary | ICD-10-CM | POA: Diagnosis present

## 2021-07-24 DIAGNOSIS — E7800 Pure hypercholesterolemia, unspecified: Secondary | ICD-10-CM | POA: Diagnosis not present

## 2021-07-24 DIAGNOSIS — I10 Essential (primary) hypertension: Secondary | ICD-10-CM | POA: Diagnosis not present

## 2021-07-24 DIAGNOSIS — R809 Proteinuria, unspecified: Secondary | ICD-10-CM

## 2021-07-24 DIAGNOSIS — K449 Diaphragmatic hernia without obstruction or gangrene: Secondary | ICD-10-CM | POA: Diagnosis present

## 2021-07-24 DIAGNOSIS — Z886 Allergy status to analgesic agent status: Secondary | ICD-10-CM

## 2021-07-24 DIAGNOSIS — R195 Other fecal abnormalities: Secondary | ICD-10-CM

## 2021-07-24 DIAGNOSIS — Z8249 Family history of ischemic heart disease and other diseases of the circulatory system: Secondary | ICD-10-CM

## 2021-07-24 DIAGNOSIS — E1142 Type 2 diabetes mellitus with diabetic polyneuropathy: Secondary | ICD-10-CM | POA: Diagnosis present

## 2021-07-24 DIAGNOSIS — Z794 Long term (current) use of insulin: Secondary | ICD-10-CM

## 2021-07-24 DIAGNOSIS — D649 Anemia, unspecified: Secondary | ICD-10-CM | POA: Diagnosis not present

## 2021-07-24 DIAGNOSIS — E119 Type 2 diabetes mellitus without complications: Secondary | ICD-10-CM

## 2021-07-24 DIAGNOSIS — Z8542 Personal history of malignant neoplasm of other parts of uterus: Secondary | ICD-10-CM

## 2021-07-24 DIAGNOSIS — I110 Hypertensive heart disease with heart failure: Principal | ICD-10-CM | POA: Diagnosis present

## 2021-07-24 DIAGNOSIS — I509 Heart failure, unspecified: Secondary | ICD-10-CM

## 2021-07-24 DIAGNOSIS — E876 Hypokalemia: Secondary | ICD-10-CM | POA: Diagnosis present

## 2021-07-24 DIAGNOSIS — Z981 Arthrodesis status: Secondary | ICD-10-CM

## 2021-07-24 DIAGNOSIS — D62 Acute posthemorrhagic anemia: Secondary | ICD-10-CM | POA: Diagnosis present

## 2021-07-24 DIAGNOSIS — E1165 Type 2 diabetes mellitus with hyperglycemia: Secondary | ICD-10-CM | POA: Diagnosis present

## 2021-07-24 DIAGNOSIS — E8809 Other disorders of plasma-protein metabolism, not elsewhere classified: Secondary | ICD-10-CM | POA: Diagnosis present

## 2021-07-24 DIAGNOSIS — K219 Gastro-esophageal reflux disease without esophagitis: Secondary | ICD-10-CM | POA: Diagnosis present

## 2021-07-24 DIAGNOSIS — K648 Other hemorrhoids: Secondary | ICD-10-CM | POA: Diagnosis present

## 2021-07-24 DIAGNOSIS — Z7984 Long term (current) use of oral hypoglycemic drugs: Secondary | ICD-10-CM

## 2021-07-24 DIAGNOSIS — I5033 Acute on chronic diastolic (congestive) heart failure: Secondary | ICD-10-CM | POA: Diagnosis present

## 2021-07-24 DIAGNOSIS — E785 Hyperlipidemia, unspecified: Secondary | ICD-10-CM | POA: Diagnosis present

## 2021-07-24 DIAGNOSIS — M797 Fibromyalgia: Secondary | ICD-10-CM | POA: Diagnosis present

## 2021-07-24 DIAGNOSIS — K589 Irritable bowel syndrome without diarrhea: Secondary | ICD-10-CM | POA: Diagnosis present

## 2021-07-24 DIAGNOSIS — Z7985 Long-term (current) use of injectable non-insulin antidiabetic drugs: Secondary | ICD-10-CM

## 2021-07-24 DIAGNOSIS — Z7901 Long term (current) use of anticoagulants: Secondary | ICD-10-CM | POA: Diagnosis not present

## 2021-07-24 DIAGNOSIS — K760 Fatty (change of) liver, not elsewhere classified: Secondary | ICD-10-CM | POA: Diagnosis present

## 2021-07-24 DIAGNOSIS — Z885 Allergy status to narcotic agent status: Secondary | ICD-10-CM | POA: Diagnosis not present

## 2021-07-24 DIAGNOSIS — K317 Polyp of stomach and duodenum: Secondary | ICD-10-CM

## 2021-07-24 DIAGNOSIS — K529 Noninfective gastroenteritis and colitis, unspecified: Secondary | ICD-10-CM | POA: Diagnosis not present

## 2021-07-24 DIAGNOSIS — E1143 Type 2 diabetes mellitus with diabetic autonomic (poly)neuropathy: Secondary | ICD-10-CM | POA: Diagnosis present

## 2021-07-24 DIAGNOSIS — W19XXXA Unspecified fall, initial encounter: Secondary | ICD-10-CM

## 2021-07-24 DIAGNOSIS — E1169 Type 2 diabetes mellitus with other specified complication: Secondary | ICD-10-CM | POA: Diagnosis not present

## 2021-07-24 DIAGNOSIS — D126 Benign neoplasm of colon, unspecified: Secondary | ICD-10-CM

## 2021-07-24 DIAGNOSIS — Z79899 Other long term (current) drug therapy: Secondary | ICD-10-CM

## 2021-07-24 DIAGNOSIS — Z833 Family history of diabetes mellitus: Secondary | ICD-10-CM

## 2021-07-24 DIAGNOSIS — E1140 Type 2 diabetes mellitus with diabetic neuropathy, unspecified: Secondary | ICD-10-CM | POA: Diagnosis not present

## 2021-07-24 DIAGNOSIS — Z882 Allergy status to sulfonamides status: Secondary | ICD-10-CM

## 2021-07-24 DIAGNOSIS — J9621 Acute and chronic respiratory failure with hypoxia: Secondary | ICD-10-CM | POA: Diagnosis present

## 2021-07-24 DIAGNOSIS — I272 Pulmonary hypertension, unspecified: Secondary | ICD-10-CM | POA: Diagnosis present

## 2021-07-24 DIAGNOSIS — Z8041 Family history of malignant neoplasm of ovary: Secondary | ICD-10-CM

## 2021-07-24 DIAGNOSIS — F419 Anxiety disorder, unspecified: Secondary | ICD-10-CM | POA: Diagnosis present

## 2021-07-24 DIAGNOSIS — E1129 Type 2 diabetes mellitus with other diabetic kidney complication: Secondary | ICD-10-CM

## 2021-07-24 DIAGNOSIS — I5032 Chronic diastolic (congestive) heart failure: Secondary | ICD-10-CM | POA: Diagnosis present

## 2021-07-24 DIAGNOSIS — K635 Polyp of colon: Secondary | ICD-10-CM | POA: Diagnosis not present

## 2021-07-24 DIAGNOSIS — F32A Depression, unspecified: Secondary | ICD-10-CM | POA: Diagnosis present

## 2021-07-24 DIAGNOSIS — K3184 Gastroparesis: Secondary | ICD-10-CM | POA: Diagnosis present

## 2021-07-24 LAB — CBC WITH DIFFERENTIAL/PLATELET
Abs Immature Granulocytes: 0.03 10*3/uL (ref 0.00–0.07)
Basophils Absolute: 0.1 10*3/uL (ref 0.0–0.1)
Basophils Relative: 1 %
Eosinophils Absolute: 0.2 10*3/uL (ref 0.0–0.5)
Eosinophils Relative: 3 %
HCT: 28.7 % — ABNORMAL LOW (ref 36.0–46.0)
Hemoglobin: 9.1 g/dL — ABNORMAL LOW (ref 12.0–15.0)
Immature Granulocytes: 0 %
Lymphocytes Relative: 12 %
Lymphs Abs: 1 10*3/uL (ref 0.7–4.0)
MCH: 26.2 pg (ref 26.0–34.0)
MCHC: 31.7 g/dL (ref 30.0–36.0)
MCV: 82.7 fL (ref 80.0–100.0)
Monocytes Absolute: 0.8 10*3/uL (ref 0.1–1.0)
Monocytes Relative: 10 %
Neutro Abs: 5.9 10*3/uL (ref 1.7–7.7)
Neutrophils Relative %: 74 %
Platelets: 217 10*3/uL (ref 150–400)
RBC: 3.47 MIL/uL — ABNORMAL LOW (ref 3.87–5.11)
RDW: 17.4 % — ABNORMAL HIGH (ref 11.5–15.5)
WBC: 8 10*3/uL (ref 4.0–10.5)
nRBC: 0 % (ref 0.0–0.2)

## 2021-07-24 LAB — BASIC METABOLIC PANEL
Anion gap: 10 (ref 5–15)
Anion gap: 7 (ref 5–15)
BUN: 18 mg/dL (ref 8–23)
BUN: 18 mg/dL (ref 8–23)
CO2: 28 mmol/L (ref 22–32)
CO2: 26 mmol/L (ref 22–32)
Calcium: 8.8 mg/dL — ABNORMAL LOW (ref 8.9–10.3)
Calcium: 7.9 mg/dL — ABNORMAL LOW (ref 8.9–10.3)
Chloride: 98 mmol/L (ref 98–111)
Chloride: 103 mmol/L (ref 98–111)
Creatinine, Ser: 0.9 mg/dL (ref 0.44–1.00)
Creatinine, Ser: 0.9 mg/dL (ref 0.44–1.00)
GFR, Estimated: >60 mL/min (ref >60)
GFR, Estimated: >60 mL/min (ref >60)
Glucose, Bld: 193 mg/dL — ABNORMAL HIGH (ref 70–99)
Glucose, Bld: 205 mg/dL — ABNORMAL HIGH (ref 70–99)
Potassium: 2.8 mmol/L — ABNORMAL LOW (ref 3.5–5.1)
Potassium: 3.1 mmol/L — ABNORMAL LOW (ref 3.5–5.1)
Sodium: 136 mmol/L (ref 135–145)
Sodium: 136 mmol/L (ref 135–145)

## 2021-07-24 LAB — CBC
HCT: 30.4 % — ABNORMAL LOW (ref 36.0–46.0)
Hemoglobin: 9.4 g/dL — ABNORMAL LOW (ref 12.0–15.0)
MCH: 25.3 pg — ABNORMAL LOW (ref 26.0–34.0)
MCHC: 30.9 g/dL (ref 30.0–36.0)
MCV: 81.7 fL (ref 80.0–100.0)
Platelets: 242 10*3/uL (ref 150–400)
RBC: 3.72 MIL/uL — ABNORMAL LOW (ref 3.87–5.11)
RDW: 17.2 % — ABNORMAL HIGH (ref 11.5–15.5)
WBC: 9 10*3/uL (ref 4.0–10.5)
nRBC: 0 % (ref 0.0–0.2)

## 2021-07-24 LAB — MAGNESIUM: Magnesium: 2.2 mg/dL (ref 1.7–2.4)

## 2021-07-24 LAB — HEPATIC FUNCTION PANEL
ALT: 28 U/L (ref 0–44)
AST: 23 U/L (ref 15–41)
Albumin: 1.8 g/dL — ABNORMAL LOW (ref 3.5–5.0)
Alkaline Phosphatase: 62 U/L (ref 38–126)
Bilirubin, Direct: 0.2 mg/dL (ref 0.0–0.2)
Indirect Bilirubin: 0.6 mg/dL (ref 0.3–0.9)
Total Bilirubin: 0.8 mg/dL (ref 0.3–1.2)
Total Protein: 4.5 g/dL — ABNORMAL LOW (ref 6.5–8.1)

## 2021-07-24 LAB — URINALYSIS, ROUTINE W REFLEX MICROSCOPIC
Bilirubin Urine: NEGATIVE
Glucose, UA: >=500 mg/dL — AB
Ketones, ur: NEGATIVE mg/dL
Leukocytes,Ua: NEGATIVE
Nitrite: NEGATIVE
Protein, ur: NEGATIVE mg/dL
Specific Gravity, Urine: 1.01 (ref 1.005–1.030)
pH: 6 (ref 5.0–8.0)

## 2021-07-24 LAB — URINALYSIS, MICROSCOPIC (REFLEX)

## 2021-07-24 LAB — RESP PANEL BY RT-PCR (FLU A&B, COVID) ARPGX2
Influenza A by PCR: NEGATIVE
Influenza B by PCR: NEGATIVE
SARS Coronavirus 2 by RT PCR: NEGATIVE

## 2021-07-24 LAB — BRAIN NATRIURETIC PEPTIDE: B Natriuretic Peptide: 868.1 pg/mL — ABNORMAL HIGH (ref 0.0–100.0)

## 2021-07-24 LAB — HEMOGLOBIN A1C
Hgb A1c MFr Bld: 8 % — ABNORMAL HIGH (ref 4.8–5.6)
Mean Plasma Glucose: 182.9 mg/dL

## 2021-07-24 LAB — CBG MONITORING, ED
Glucose-Capillary: 175 mg/dL — ABNORMAL HIGH (ref 70–99)
Glucose-Capillary: 184 mg/dL — ABNORMAL HIGH (ref 70–99)

## 2021-07-24 LAB — CK: Total CK: 61 U/L (ref 38–234)

## 2021-07-24 LAB — PHOSPHORUS: Phosphorus: 2.3 mg/dL — ABNORMAL LOW (ref 2.5–4.6)

## 2021-07-24 LAB — TROPONIN I (HIGH SENSITIVITY)
Troponin I (High Sensitivity): 29 ng/L — ABNORMAL HIGH (ref <18)
Troponin I (High Sensitivity): 20 ng/L — ABNORMAL HIGH (ref <18)

## 2021-07-24 MED ORDER — POTASSIUM CHLORIDE 10 MEQ/100ML IV SOLN
10.0000 meq | INTRAVENOUS | Status: AC
  Administered 2021-07-24 (×2): 10 meq via INTRAVENOUS
  Filled 2021-07-24 (×2): qty 100

## 2021-07-24 MED ORDER — POTASSIUM CHLORIDE 10 MEQ/100ML IV SOLN
10.0000 meq | Freq: Once | INTRAVENOUS | Status: AC
  Administered 2021-07-24: 10 meq via INTRAVENOUS
  Filled 2021-07-24: qty 100

## 2021-07-24 MED ORDER — AMIODARONE HCL 200 MG PO TABS
200.0000 mg | ORAL_TABLET | Freq: Every day | ORAL | Status: DC
  Administered 2021-07-25 – 2021-07-29 (×5): 200 mg via ORAL
  Filled 2021-07-24 (×6): qty 1

## 2021-07-24 MED ORDER — POTASSIUM CHLORIDE CRYS ER 20 MEQ PO TBCR
40.0000 meq | EXTENDED_RELEASE_TABLET | Freq: Once | ORAL | Status: AC
  Administered 2021-07-24: 40 meq via ORAL
  Filled 2021-07-24: qty 2

## 2021-07-24 MED ORDER — INSULIN ASPART 100 UNIT/ML IJ SOLN
0.0000 [IU] | INTRAMUSCULAR | Status: DC
  Administered 2021-07-24 – 2021-07-25 (×4): 2 [IU] via SUBCUTANEOUS
  Administered 2021-07-25 (×2): 1 [IU] via SUBCUTANEOUS
  Administered 2021-07-25: 17:00:00 2 [IU] via SUBCUTANEOUS
  Administered 2021-07-26: 1 [IU] via SUBCUTANEOUS
  Administered 2021-07-26: 2 [IU] via SUBCUTANEOUS
  Administered 2021-07-26 – 2021-07-27 (×4): 1 [IU] via SUBCUTANEOUS
  Administered 2021-07-28: 3 [IU] via SUBCUTANEOUS
  Administered 2021-07-28: 1 [IU] via SUBCUTANEOUS

## 2021-07-24 MED ORDER — FUROSEMIDE 10 MG/ML IJ SOLN
40.0000 mg | Freq: Once | INTRAMUSCULAR | Status: AC
  Administered 2021-07-24: 40 mg via INTRAVENOUS
  Filled 2021-07-24: qty 4

## 2021-07-24 NOTE — ED Provider Notes (Signed)
[facility] EMERGENCY DEPARTMENT Provider Note   CSN: [id number] Arrival date & time: 07/24/21  1328     History Chief Complaint  Patient presents with   Shortness of Breath    Alexis [name] is a 70 y.o. female.  HPI 70 year old female presents with shortness of breath, cough, and hypoxia.  She states she has been feeling this way for about 3 or 4 days.  The cough is nonproductive.  There is no fever.  She reports compliance with her medicines though she has been drinking more water and eats out a lot.  She denies adding extra salt.  She has had worsening orthopnea.  At home she checked her oxygen and it was around 63% and today while she was visiting someone else they checked her oxygen it was 80% on room air.  She does not wear oxygen typically.  The dyspnea is mostly at rest.  She has been feeling a little more abdominal distention than typical though no leg swelling. She broke her right foot about 1 week ago.  Past Medical History:  Diagnosis Date   Anxiety    Arthritis    "knees" (05/25/2014)   Chronic back pain    Depression    Diabetes mellitus type II    Diabetic peripheral neuropathy ([place]) 10/20/2018   Diverticulitis    Fatty liver    Fibromyalgia    Gastric polyp    hyperplastic   Gastroparesis    "recently dx'd" (05/25/2014)   GERD (gastroesophageal reflux disease)    H/O hiatal hernia    Hx of gastritis    Hyperlipidemia    Hypertension    Irritable bowel syndrome (IBS)    Memory difficulties 09/18/2017   Migraine without aura, without mention of intractable migraine without mention of status migrainosus    "related to allergies; have them in the spring and fall" (05/25/2014)   Obesity    Sleep apnea    "wore mask; took it off in my sleep; quit wearing it" (05/25/2014)   Uterine cancer ([place]) dx'd 2000   surg only   Vasculitis ([place])    "irritates my legs"   Wears glasses     Patient Active Problem List   Diagnosis Date Noted    CHF exacerbation ([place]) 07/24/2021   Acute on chronic respiratory failure with hypoxia ([place]) 07/24/2021   Acute on chronic diastolic CHF (congestive heart failure) ([place]) 07/24/2021   Hypokalemia 07/24/2021   New onset of congestive heart failure ([place]) 04/26/2021   Hypotension 40/98/1191   Acute metabolic encephalopathy 47/82/9562   Paroxysmal atrial fibrillation (HCC)    Acute blood loss anemia    Subcapital fracture of left femur ([place]) 12/04/2020   Leukocytosis 12/04/2020   Fall at home, initial encounter 12/04/2020   Diabetic peripheral neuropathy ([place]) 10/20/2018   Cervical myelopathy ([place]) 05/07/2018   Type 2 diabetes mellitus ([place]) 02/18/2018   Memory difficulties 09/18/2017   Coronary artery calcification seen on computed tomography 04/09/2017   Word finding difficulty 09/05/2016   Cervicogenic headache 09/05/2016   Palpitation 08/22/2016   Bilateral leg edema 08/22/2016   Obesity (BMI 30-39.9) 05/25/2014   Precordial chest pain 05/25/2014   Migraine without aura, without mention of intractable migraine without mention of status migrainosus 03/18/2013   Chest pain 01/29/2013   Constipation 02/24/2012   DM2 (diabetes mellitus, type 2) ([place]) 08/24/2010   Hyperlipidemia 08/24/2010   ANXIETY 08/24/2010   Anxiety and depression 08/24/2010   Essential hypertension 08/24/2010   GERD 08/24/2010  IRRITABLE BOWEL SYNDROME 08/24/2010   FATTY LIVER DISEASE 08/24/2010   ARTHRITIS 08/24/2010   Fibromyalgia 08/24/2010   OSA (obstructive sleep apnea) 08/24/2010   ABDOMINAL PAIN-RUQ 08/24/2010   UTERINE CANCER, HX OF 08/24/2010   GASTRITIS, HX OF 08/24/2010    Past Surgical History:  Procedure Laterality Date   ANTERIOR CERVICAL DECOMPRESSION/DISCECTOMY FUSION 4 LEVELS N/A 05/07/2018   Procedure: ANTERIOR CERVICAL DECOMPRESSION FUSION, CERVICAL 4-5,CERVICAL 5-6, CERVICAL 6-7 WITH INSTRUMENTATION AND ALLOGRAFT;  Surgeon: Phylliss Bob, MD;  Location: [place];  Service: Orthopedics;   Laterality: N/A;   APPENDECTOMY  ~ [place]   LEFT HEART CATHETERIZATION WITH CORONARY ANGIOGRAM N/A 05/26/2014   Procedure: LEFT HEART CATHETERIZATION WITH CORONARY ANGIOGRAM;  Surgeon: Troy Sine, MD;  Location: [facility] CATH LAB;  Service: Cardiovascular;  Laterality: N/A;   SINUS SURGERY WITH INSTATRAK  2000   TOTAL HIP ARTHROPLASTY Left 12/05/2020   Procedure: TOTAL HIP ARTHROPLASTY ANTERIOR APPROACH;  Surgeon: Rod Can, MD;  Location: [place];  Service: Orthopedics;  Laterality: Left;   TUBAL LIGATION  ~ 1982   VAGINAL HYSTERECTOMY  2000     OB History     Gravida      Para      Term      Preterm      AB      Living  2      SAB      IAB      Ectopic      Multiple      Live Births              Family History  Problem Relation Age of Onset   Breast cancer Sister    Colon polyps Father    Heart disease Father    Colon cancer Maternal Uncle    Ovarian cancer Maternal Aunt    Stomach cancer Maternal Aunt    Diabetes Maternal Aunt    Heart disease Maternal Uncle    Heart disease Other        Grandparents   Irritable bowel syndrome Daughter     Social History   Tobacco Use   Smoking status: Never   Smokeless tobacco: Never  Vaping Use   Vaping Use: Never used  Substance Use Topics   Alcohol use: No   Drug use: No    Home Medications Prior to Admission medications   Medication Sig Start Date End Date Taking? Authorizing Provider  acetaminophen (TYLENOL) 325 MG tablet Take 2 tablets (650 mg total) by mouth every 6 (six) hours as needed for mild pain or moderate pain. 12/12/20  Yes Nolberto Hanlon, MD  ALPRAZolam Duanne Moron) 0.5 MG tablet Take 0.5 mg by mouth at bedtime.   Yes [provider]  amiodarone (PACERONE) 200 MG tablet Take 1 tablet (200 mg total) by mouth daily. Patient taking differently: Take 200 mg by mouth 2 (two) times daily. 05/14/21  Yes  Hilty, Nadean Corwin, MD  amLODipine (NORVASC) 5 MG tablet Take 5 mg by mouth every evening.   Yes [provider]  apixaban (ELIQUIS) 5 MG TABS tablet Take 1 tablet (5 mg total) by mouth 2 (two) times daily. 12/12/20  Yes Nolberto Hanlon, MD  b complex vitamins tablet Take 1 tablet by mouth at bedtime.   Yes [provider]  FARXIGA 10 MG TABS tablet Take 10 mg by mouth daily. 09/10/19  Yes [provider]  fish oil-omega-3 fatty acids 1000 MG capsule Take 1 g by mouth at bedtime.    Yes [provider]  furosemide (LASIX) 20 MG tablet Take 1 tablet (20 mg total) by mouth daily. 05/14/21 08/12/21 Yes Hilty, Nadean Corwin, MD  insulin lispro (HUMALOG) 100 UNIT/ML KwikPen Inject 8 Units into the skin 3 (three) times daily as needed (high blood sugar). Per sliding scale   Yes [provider]  metFORMIN (GLUCOPHAGE-XR) 500 MG 24 hr tablet Take 500 mg by mouth daily. 08/24/19  Yes [provider]  metoprolol succinate (TOPROL-XL) 50 MG 24 hr tablet Take 2 tablets (100 mg total) by mouth in the morning and at bedtime. Take with or immediately following a meal. 01/29/21  Yes Almyra Deforest, PA  pregabalin (LYRICA) 50 MG capsule Take 50 mg by mouth 2 (two) times daily.   Yes [provider]  RABEprazole (ACIPHEX) 20 MG tablet Take 1 tablet (20 mg total) by mouth 2 (two) times daily. 04/22/12  Yes Lafayette Dragon, MD  tamsulosin (FLOMAX) 0.4 MG CAPS capsule Take 0.4 mg by mouth at bedtime. 07/17/16  Yes [provider]  TRESIBA FLEXTOUCH 100 UNIT/ML FlexTouch Pen Inject 20 Units into the skin at bedtime. 09/29/19  Yes [provider]  TRULICITY 1.5 DX/4.1OI SOPN Inject 1.5 mg into the skin once a week. 05/07/21  Yes [provider]  valsartan (DIOVAN) 320 MG tablet Take 320 mg by mouth daily.   Yes [provider]  venlafaxine XR (EFFEXOR-XR) 150 MG 24 hr capsule Take 150 mg by mouth 2 (two) times daily.   Yes [provider]   zaleplon (SONATA) 5 MG capsule Take 5 mg by mouth at bedtime.   Yes [provider]    Allergies    Aspirin, Codeine, Naproxen, and Sulfonamide derivatives  Review of Systems   Review of Systems  Constitutional:  Negative for fever.  Respiratory:  Positive for cough and shortness of breath.   Cardiovascular:  Negative for chest pain and leg swelling.  Gastrointestinal:  Positive for abdominal distention and nausea. Negative for abdominal pain.  All other systems reviewed and are negative.  Physical Exam Updated Vital Signs BP (!) 152/59    Pulse (!) 59    Temp 97.9 F (36.6 C)    Resp 19    SpO2 97%   Physical Exam Vitals and nursing note reviewed.  Constitutional:      General: She is not in acute distress.    Appearance: She is well-developed. She is not ill-appearing or diaphoretic.  HENT:     Head: Normocephalic and atraumatic.     Right Ear: External ear normal.     Left Ear: External ear normal.     Nose: Nose normal.  Eyes:     General:        Right eye: No discharge.        Left eye: No discharge.  Cardiovascular:     Rate and Rhythm: Normal rate and regular rhythm.     Heart sounds: Normal heart sounds.  Pulmonary:     Effort: Pulmonary effort is normal. No accessory muscle usage.     Breath sounds: Examination of the right-lower field reveals decreased breath sounds and rales. Examination of the left-lower field reveals decreased breath sounds and rales. Decreased breath sounds and rales present.  Abdominal:     Palpations: Abdomen is soft.     Tenderness: There is no abdominal tenderness.  Skin:  General: Skin is warm and dry.  Neurological:     Mental Status: She is alert.  Psychiatric:        Mood and Affect: Mood is not anxious.    ED Results / Procedures / Treatments   Labs (all labs ordered are listed, but only abnormal results are displayed) Labs Reviewed  BASIC METABOLIC PANEL - Abnormal; Notable for the following components:       Result Value   Potassium 2.8 (*)    Glucose, Bld 193 (*)    Calcium 8.8 (*)    All other components within normal limits  CBC - Abnormal; Notable for the following components:   RBC 3.72 (*)    Hemoglobin 9.4 (*)    HCT 30.4 (*)    MCH 25.3 (*)    RDW 17.2 (*)    All other components within normal limits  BRAIN NATRIURETIC PEPTIDE - Abnormal; Notable for the following components:   B Natriuretic Peptide 868.1 (*)    All other components within normal limits  HEPATIC FUNCTION PANEL - Abnormal; Notable for the following components:   Total Protein 4.5 (*)    Albumin 1.8 (*)    All other components within normal limits  PHOSPHORUS - Abnormal; Notable for the following components:   Phosphorus 2.3 (*)    All other components within normal limits  BASIC METABOLIC PANEL - Abnormal; Notable for the following components:   Potassium 3.1 (*)    Glucose, Bld 205 (*)    Calcium 7.9 (*)    All other components within normal limits  CBC WITH DIFFERENTIAL/PLATELET - Abnormal; Notable for the following components:   RBC 3.47 (*)    Hemoglobin 9.1 (*)    HCT 28.7 (*)    RDW 17.4 (*)    All other components within normal limits  HEMOGLOBIN A1C - Abnormal; Notable for the following components:   Hgb A1c MFr Bld 8.0 (*)    All other components within normal limits  CBG MONITORING, ED - Abnormal; Notable for the following components:   Glucose-Capillary 184 (*)    All other components within normal limits  TROPONIN I (HIGH SENSITIVITY) - Abnormal; Notable for the following components:   Troponin I (High Sensitivity) 20 (*)    All other components within normal limits  TROPONIN I (HIGH SENSITIVITY) - Abnormal; Notable for the following components:   Troponin I (High Sensitivity) 29 (*)    All other components within normal limits  RESP PANEL BY RT-PCR (FLU A&B, COVID) ARPGX2  MAGNESIUM  CK  URINALYSIS, ROUTINE W REFLEX MICROSCOPIC  CBC WITH DIFFERENTIAL/PLATELET  CBC WITH  DIFFERENTIAL/PLATELET  LIPID PANEL    EKG EKG Interpretation  Date/Time:  Tuesday July 24 2021 16:47:28 EST Ventricular Rate:  58 PR Interval:  178 QRS Duration: 134 QT Interval:  508 QTC Calculation: 499 R Axis:   -77 Text Interpretation: Sinus rhythm Nonspecific IVCD with LAD Probable anteroseptal infarct, old Confirmed by Sherwood Gambler [phone]) on 07/24/2021 5:06:48 PM  Radiology DG Chest 2 View  Result Date: 07/24/2021 CLINICAL DATA:  Shortness of breath and hypoxia EXAM: CHEST - 2 VIEW COMPARISON:  04/26/2021 FINDINGS: Cardiomegaly. Aortic atherosclerosis. Pulmonary venous hypertension. Bilateral pleural effusions with dependent pulmonary atelectasis. Findings consistent with congestive heart failure. Upper lungs are otherwise clear. IMPRESSION: Congestive heart failure. Cardiomegaly, aortic atherosclerosis, pulmonary venous hypertension, small pleural effusions and dependent atelectasis. Electronically Signed   By: Nelson Chimes M.D.   On: 07/24/2021 14:39    Procedures Procedures  Medications Ordered in ED Medications  amiodarone (PACERONE) tablet 200 mg (has no administration in time range)  potassium chloride 10 mEq in 100 mL IVPB (10 mEq Intravenous New Bag/Given 07/24/21 2208)  insulin aspart (novoLOG) injection 0-9 Units (2 Units Subcutaneous Given 07/24/21 2017)  potassium chloride SA (KLOR-CON M) CR tablet 40 mEq (40 mEq Oral Given 07/24/21 1748)  furosemide (LASIX) injection 40 mg (40 mg Intravenous Given 07/24/21 1804)  potassium chloride 10 mEq in 100 mL IVPB (0 mEq Intravenous Stopped 07/24/21 1959)    ED Course  I have reviewed the triage vital signs and the nursing notes.  Pertinent labs & imaging results that were available during my care of the patient were reviewed by me and considered in my medical decision making (see chart for details).    MDM Rules/Calculators/A&P                           Patient did get mildly hypoxic to 88% on room air.  Presentation is consistent with acute CHF. She also has hypokalemia, so will replace potassium and diurese. Otherwise is stable. Dr. Roel Cluck to admit.  Final Clinical Impression(s) / ED Diagnoses Final diagnoses:  Acute respiratory failure with hypoxia (HCC)  Acute on chronic congestive heart failure, unspecified heart failure type [facility])    Rx / DC Orders ED Discharge Orders     None        Sherwood Gambler, MD 07/24/21 2253

## 2021-07-24 NOTE — ED Provider Notes (Signed)
Emergency Medicine Provider Triage Evaluation Note  Alexis [name] , a 70 y.o. female  was evaluated in triage.  Pt complains of shortness of breath and chest pain for the past couple days.  Patient reports last night her O2 was in the 60s and she slept with multiple pillows.  She has a history of congestive heart failure and A. fib and reports compliance with her diuretic and anticoagulant..  Review of Systems  Positive: Chest pain, shortness of breath, cough Negative: Fever  Physical Exam  BP (!) 139/57 (BP Location: Right Arm)    Pulse (!) 57    Temp 98.4 F (36.9 C) (Oral)    Resp 20    SpO2 100%  Gen:   Awake, no distress   Resp:  Normal effort  MSK:   Moves extremities without difficulty  Other:    Medical Decision Making  Medically screening exam initiated at 2:36 PM.  Appropriate orders placed.  [name] was informed that the remainder of the evaluation will be completed by another provider, this initial triage assessment does not replace that evaluation, and the importance of remaining in the ED until their evaluation is complete.     Evlyn Courier, PA-C 07/24/21 1437    Daleen Bo, MD 07/25/21 1136

## 2021-07-24 NOTE — ED Triage Notes (Signed)
Patient reports fatigue and feeling weak over the past week. Patient checked oxygen saturation last night and states it was in the 60s. States she propped herself up with a pillow and went to bed. Patient placed on 2L [place] on arrival. Denies any chest pain. Speaking in full sentences.

## 2021-07-24 NOTE — ED Notes (Signed)
Pt checked in for [facility] and this writer assessed vital signs. SpO2 noted to be 90% RA. She states that she that her O2 sats this morning were in the 80s, Pt was give 2L  for precautions.

## 2021-07-24 NOTE — H&P (Addendum)
Alexis [name] NLG:[id number] DOB: 1951/02/05 DOA: 07/24/2021     PCP: Deland Pretty, MD   Outpatient Specialists:   CARDS:   Dr. Debara Pickett   NEurology  Dr. Jannifer Franklin    GI Dr. Loletha Carrow    Orthopedics [place]  Patient arrived to ER on 07/24/21 at 1328 Referred by Attending Toy Baker, MD   Patient coming from: home Lives  With family    Chief Complaint:   Chief Complaint  Patient presents with   Shortness of Breath    HPI: Alexis [name] is a 70 y.o. female with medical history significant of  CHF, A.fib DM2, Gastroparesis, GERD, HTN, HLD, OSA fatty liver disease, IBS    Presented with   Pt complains of shortness of breath and chest pain for the past couple days.  Reports orthopnea had to seep on 2 pillows and cheked her O2 it was down to 60% Has been going out to eat out, has been taking her medications and anticoagulation  On arrival to ER O2 sats were 90% on RA but pt reports lower at home 1 wk of fatigue and leg swelling, SOB with exertion   Not on O2 at home She is really SOB with minimal exertion sometimes even eating makes her SOB Ever after she broke her hip her leg has been giving out  Reports  dyspnea on exertion has been there since October  Last week she tripped on sidewalk and fell down and broke her right foot Denies hitting her head Had another fall 4 wks ago in the bathroom also denies head injury  Has   been vaccinated against COVID and boosted and flu   Initial COVID TEST  NEGATIVE   Lab Results  Component Value Date   [place] 07/24/2021   [place] NEGATIVE 04/26/2021   [place] NEGATIVE 12/11/2020   [place] NEGATIVE 12/04/2020    Regarding pertinent Chronic problems:     Hyperlipidemia - came off   statins unsure why Lipid Panel     Component Value Date/Time   CHOL 169 04/26/2021 1655   TRIG 113 04/26/2021 1655   HDL 51 04/26/2021 1655   CHOLHDL 3.3 04/26/2021 1655   VLDL 23 04/26/2021 1655   [place]  95 04/26/2021 1655     HTN on NOrvasc, TOPROL, DIOVAN   chronic CHF diastolic/systolic/ combined - last echo 04/26/2021  65 to 70%.     DM 2 -  Lab Results  Component Value Date   HGBA1C 8.3 (H) 04/26/2021   On Tresiba and humalog and trulicity  CAD - coronary CT, this was performed on 11/21/2020, this revealed diffuse nonobstructive CAD, coronary calcium score 469 which placed the patient in 93rd percentile for age and sex matched control, small PFO.    obesity-   BMI Readings from Last 1 Encounters:  07/03/21 30.45 kg/m      OSA - not using CPAP      A. Fib -  - CHA2DS2 vas score   6   current  on anticoagulation with  Eliquis,          -  Rate control:  Currently controlled with  Toprolol,         - Rhythm control:  amiodarone      Chronic anemia - baseline hg Hemoglobin & Hematocrit  Recent Labs    04/26/21 0858 04/27/21 0242 07/24/21 1355  HGB 10.3* 9.5* 9.4*    While in ER: K down to 2.8 BNP up to 868 CXR -  evidence of CHF    ED Triage Vitals  Enc Vitals Group     BP 07/24/21 1333 (!) 139/57     Pulse Rate 07/24/21 1333 (!) 58     Resp 07/24/21 1333 20     Temp 07/24/21 1347 98.4 F (36.9 C)     Temp Source 07/24/21 1347 Oral     SpO2 07/24/21 1333 93 %     Weight --      Height --      Head Circumference --      Peak Flow --      Pain Score 07/24/21 1345 0     Pain Loc --      Pain Edu? --      Excl. in [place]? --   TMAX(24)@     _________________________________________ Significant initial  Findings: Abnormal Labs Reviewed  BASIC METABOLIC PANEL - Abnormal; Notable for the following components:      Result Value   Potassium 2.8 (*)    Glucose, Bld 193 (*)    Calcium 8.8 (*)    All other components within normal limits  CBC - Abnormal; Notable for the following components:   RBC 3.72 (*)    Hemoglobin 9.4 (*)    HCT 30.4 (*)    MCH 25.3 (*)    RDW 17.2 (*)    All other components within normal limits  BRAIN NATRIURETIC PEPTIDE - Abnormal;  Notable for the following components:   B Natriuretic Peptide 868.1 (*)    All other components within normal limits   ____________________________________________ Ordered   CXR - CHF  _________________________ Troponin 20 ECG: Ordered Personally reviewed by me showing: HR : 58 Rhythm: SB, RBBB   no evidence of ischemic changes QTC 499   The recent clinical data is shown below. Vitals:   07/24/21 1715 07/24/21 1730 07/24/21 1800 07/24/21 1830  BP: (!) 147/59 (!) 146/60 (!) 143/60 (!) 153/63  Pulse: (!) 56 (!) 57 (!) 58 (!) 58  Resp: 16 18 20 18   Temp:      TempSrc:      SpO2: 92% 90% 94% 94%    WBC     Component Value Date/Time   WBC 9.0 07/24/2021 1355   LYMPHSABS 1.2 12/06/2020 2124   MONOABS 1.2 (H) 12/06/2020 2124   EOSABS 0.0 12/06/2020 2124   BASOSABS 0.0 12/06/2020 2124      UA nonacute   Urine analysis:    Component Value Date/Time   COLORURINE YELLOW 07/24/2021 1950   APPEARANCEUR HAZY (A) 07/24/2021 1950   LABSPEC 1.010 07/24/2021 1950   PHURINE 6.0 07/24/2021 1950   GLUCOSEU >=500 (A) 07/24/2021 1950   HGBUR TRACE (A) 07/24/2021 1950   BILIRUBINUR NEGATIVE 07/24/2021 1950   KETONESUR NEGATIVE 07/24/2021 1950   PROTEINUR NEGATIVE 07/24/2021 1950   UROBILINOGEN 0.2 02/09/2014 1603   NITRITE NEGATIVE 07/24/2021 1950   LEUKOCYTESUR NEGATIVE 07/24/2021 1950    Results for orders placed or performed during the hospital encounter of 07/24/21  Resp Panel by RT-PCR (Flu A&B, Covid) Nasopharyngeal Swab     Status: None   Collection Time: 07/24/21  2:38 PM   Specimen: Nasopharyngeal Swab; Nasopharyngeal(NP) swabs in vial transport medium  Result Value Ref Range Status   SARS Coronavirus 2 by RT PCR NEGATIVE NEGATIVE Final         Influenza A by PCR NEGATIVE NEGATIVE Final   Influenza B by PCR NEGATIVE NEGATIVE Final  _______________________________________________ Hospitalist was called for admission for CHF exacerbation  The following  Work up has been ordered so far:  Orders Placed This Encounter  Procedures   Resp Panel by RT-PCR (Flu A&B, Covid) Nasopharyngeal Swab   DG Chest 2 View   Basic metabolic panel   CBC   Brain natriuretic peptide   Magnesium   Document Height and Actual Weight   Check Pulse Oximetry while ambulating   Cardiac monitoring   Consult to hospitalist   EKG 12-Lead   ED EKG   EKG 12-Lead   Admit to Inpatient (patient's expected length of stay will be greater than 2 midnights or inpatient only procedure)    Following Medications were ordered in ER: Medications  potassium chloride SA (KLOR-CON M) CR tablet 40 mEq (40 mEq Oral Given 07/24/21 1748)  furosemide (LASIX) injection 40 mg (40 mg Intravenous Given 07/24/21 1804)  potassium chloride 10 mEq in 100 mL IVPB (10 mEq Intravenous New Bag/Given 07/24/21 1807)        Consult Orders  (From admission, onward)           Start     Ordered   07/24/21 1802  Consult to hospitalist  Once       Provider:  (Not yet assigned)  Question Answer Comment  Place call to: [facility]   Reason for Consult Admit      07/24/21 1801            OTHER Significant initial  Findings:  labs showing:    Recent Labs  Lab 07/24/21 1355  NA 136  K 2.8*  CO2 28  GLUCOSE 193*  BUN 18  CREATININE 0.90  CALCIUM 8.8*    Cr   stable,    Lab Results  Component Value Date   CREATININE 0.90 07/24/2021   CREATININE 0.83 04/28/2021   CREATININE 0.86 04/27/2021    No results for input(s): AST, ALT, ALKPHOS, BILITOT, PROT, ALBUMIN in the last 168 hours. Lab Results  Component Value Date   CALCIUM 8.8 (L) 07/24/2021    Plt: Lab Results  Component Value Date   PLT 242 07/24/2021     COVID-19 Labs  No results for input(s): DDIMER, FERRITIN, LDH, CRP in the last 72 hours.  Lab Results  Component Value Date   SARSCOV2NAA NEGATIVE 07/24/2021   SARSCOV2NAA NEGATIVE 04/26/2021   SARSCOV2NAA NEGATIVE 12/11/2020   [place]  NEGATIVE 12/04/2020         Recent Labs  Lab 07/24/21 1355  WBC 9.0  HGB 9.4*  HCT 30.4*  MCV 81.7  PLT 242    HG/HCT  stable,      Component Value Date/Time   HGB 9.4 (L) 07/24/2021 1355   HCT 30.4 (L) 07/24/2021 1355   MCV 81.7 07/24/2021 1355     Cardiac Panel (last 3 results) Recent Labs    07/24/21 1931  CKTOTAL 61     BNP (last 3 results) Recent Labs    12/09/20 0337 04/26/21 0949 07/24/21 1355  BNP 530.1* 663.8* 868.1*      DM  labs:  HbA1C: Recent Labs    12/04/20 0549 04/26/21 1655  HGBA1C 10.3* 8.3*       CBG (last 3)  Recent Labs    07/24/21 1955 07/24/21 2330  GLUCAP 184* 175*          Cultures:    Component Value Date/Time   SDES BLOOD RIGHT ANTECUBITAL 12/07/2020 1317   SDES BLOOD BLOOD LEFT HAND 12/07/2020 1317  SPECREQUEST  12/07/2020 1317    BOTTLES DRAWN AEROBIC AND ANAEROBIC Blood Culture adequate volume   SPECREQUEST  12/07/2020 1317    BOTTLES DRAWN AEROBIC AND ANAEROBIC Blood Culture adequate volume   CULT  12/07/2020 1317    NO GROWTH 5 DAYS Performed at [facility], [place] [address], [place], [place] [address]    CULT  12/07/2020 1317    NO GROWTH 5 DAYS Performed at [facility], [place] [address], [place], [place] [address]    REPTSTATUS 12/12/2020 FINAL 12/07/2020 1317   REPTSTATUS 12/12/2020 FINAL 12/07/2020 1317     Radiological Exams on Admission: DG Chest 2 View  Result Date: 07/24/2021 CLINICAL DATA:  Shortness of breath and hypoxia EXAM: CHEST - 2 VIEW COMPARISON:  04/26/2021 FINDINGS: Cardiomegaly. Aortic atherosclerosis. Pulmonary venous hypertension. Bilateral pleural effusions with dependent pulmonary atelectasis. Findings consistent with congestive heart failure. Upper lungs are otherwise clear. IMPRESSION: Congestive heart failure. Cardiomegaly, aortic atherosclerosis, pulmonary venous hypertension, small pleural effusions and dependent atelectasis. Electronically Signed   By: Nelson Chimes M.D.   On: 07/24/2021 14:39   _______________________________________________________________________________________________________ Latest  Blood pressure (!) 153/63, pulse (!) 58, temperature 97.9 F (36.6 C), resp. rate 18, SpO2 94 %.   Review of Systems:    Pertinent positives include:  shortness of breath at rest.   dyspnea on exertion,  Bilateral lower extremity swelling  Constitutional:  No weight loss, night sweats, Fevers, chills, fatigue, weight loss  HEENT:  No headaches, Difficulty swallowing,Tooth/dental problems,Sore throat,  No sneezing, itching, ear ache, nasal congestion, post nasal drip,  Cardio-vascular:  No chest pain, Orthopnea, PND, anasarca, dizziness, palpitations.no  GI:  No heartburn, indigestion, abdominal pain, nausea, vomiting, diarrhea, change in bowel habits, loss of appetite, melena, blood in stool, hematemesis Resp:  no No excess mucus, no productive cough, No non-productive cough, No coughing up of blood.No change in color of mucus.No wheezing. Skin:  no rash or lesions. No jaundice GU:  no dysuria, change in color of urine, no urgency or frequency. No straining to urinate.  No flank pain.  Musculoskeletal:  No joint pain or no joint swelling. No decreased range of motion. No back pain.  Psych:  No change in mood or affect. No depression or anxiety. No memory loss.  Neuro: no localizing neurological complaints, no tingling, no weakness, no double vision, no gait abnormality, no slurred speech, no confusion  All systems reviewed and apart from [place] all are negative _______________________________________________________________________________________________ Past Medical History:   Past Medical History:  Diagnosis Date   Anxiety    Arthritis    "knees" (05/25/2014)   Chronic back pain    Depression    Diabetes mellitus type II    Diabetic peripheral neuropathy ([place]) 10/20/2018   Diverticulitis    Fatty liver    Fibromyalgia     Gastric polyp    hyperplastic   Gastroparesis    "recently dx'd" (05/25/2014)   GERD (gastroesophageal reflux disease)    H/O hiatal hernia    Hx of gastritis    Hyperlipidemia    Hypertension    Irritable bowel syndrome (IBS)    Memory difficulties 09/18/2017   Migraine without aura, without mention of intractable migraine without mention of status migrainosus    "related to allergies; have them in the spring and fall" (05/25/2014)   Obesity    Sleep apnea    "wore mask; took it off in my sleep; quit wearing it" (05/25/2014)   Uterine cancer ([place])  dx'd 2000   surg only   Vasculitis ([place])    "irritates my legs"   Wears glasses      Past Surgical History:  Procedure Laterality Date   ANTERIOR CERVICAL DECOMPRESSION/DISCECTOMY FUSION 4 LEVELS N/A 05/07/2018   Procedure: ANTERIOR CERVICAL DECOMPRESSION FUSION, CERVICAL 4-5,CERVICAL 5-6, CERVICAL 6-7 WITH INSTRUMENTATION AND ALLOGRAFT;  Surgeon: Phylliss Bob, MD;  Location: [facility];  Service: Orthopedics;  Laterality: N/A;   APPENDECTOMY  ~ [place]   LEFT HEART CATHETERIZATION WITH CORONARY ANGIOGRAM N/A 05/26/2014   Procedure: LEFT HEART CATHETERIZATION WITH CORONARY ANGIOGRAM;  Surgeon: Troy Sine, MD;  Location: [facility] CATH LAB;  Service: Cardiovascular;  Laterality: N/A;   SINUS SURGERY WITH INSTATRAK  2000   TOTAL HIP ARTHROPLASTY Left 12/05/2020   Procedure: TOTAL HIP ARTHROPLASTY ANTERIOR APPROACH;  Surgeon: Rod Can, MD;  Location: [facility];  Service: Orthopedics;  Laterality: Left;   TUBAL LIGATION  ~ [place]  2000    Social History:  Ambulatory  walker     reports that she has never smoked. She has never used smokeless tobacco. She reports that she does not drink alcohol and does not use drugs.   Family History:   Family History  Problem Relation Age of Onset   Breast cancer Sister    Colon polyps Father     Heart disease Father    Colon cancer Maternal Uncle    Ovarian cancer Maternal Aunt    Stomach cancer Maternal Aunt    Diabetes Maternal Aunt    Heart disease Maternal Uncle    Heart disease Other        Grandparents   Irritable bowel syndrome Daughter    ______________________________________________________________________________________________ Allergies: Allergies  Allergen Reactions   Aspirin Other (See Comments)    Reaction: Vasculitis per MD   Codeine Itching and Rash    Tolerated Norco 12/07/20 with no reports of rash/itching. Patient endorsed allergy >30 yr ago and hasn't had reaction since.   Naproxen Nausea Only    Reaction: Makes stomach upset/irritated.   Sulfonamide Derivatives Itching and Rash     Prior to Admission medications   Medication Sig Start Date End Date Taking? Authorizing Provider  acetaminophen (TYLENOL) 325 MG tablet Take 2 tablets (650 mg total) by mouth every 6 (six) hours as needed for mild pain or moderate pain. 12/12/20  Yes Nolberto Hanlon, MD  ALPRAZolam Duanne Moron) 0.5 MG tablet Take 0.5 mg by mouth at bedtime.   Yes [provider]  amiodarone (PACERONE) 200 MG tablet Take 1 tablet (200 mg total) by mouth daily. Patient taking differently: Take 200 mg by mouth 2 (two) times daily. 05/14/21  Yes Hilty, Nadean Corwin, MD  amLODipine (NORVASC) 5 MG tablet Take 5 mg by mouth every evening.   Yes [provider]  apixaban (ELIQUIS) 5 MG TABS tablet Take 1 tablet (5 mg total) by mouth 2 (two) times daily. 12/12/20  Yes Nolberto Hanlon, MD  b complex vitamins tablet Take 1 tablet by mouth at bedtime.   Yes [provider]  FARXIGA 10 MG TABS tablet Take 10 mg by mouth daily. 09/10/19  Yes [provider]  fish oil-omega-3 fatty acids 1000 MG capsule Take 1 g by mouth at bedtime.    Yes [provider]  furosemide (LASIX) 20 MG tablet Take 1 tablet (20 mg total) by mouth daily. 05/14/21 08/12/21  Yes Hilty, Nadean Corwin, MD   insulin lispro (HUMALOG) 100 UNIT/ML KwikPen Inject 8 Units into the skin 3 (three) times daily as needed (high blood sugar). Per sliding scale   Yes [provider]  metFORMIN (GLUCOPHAGE-XR) 500 MG 24 hr tablet Take 500 mg by mouth daily. 08/24/19  Yes [provider]  metoprolol succinate (TOPROL-XL) 50 MG 24 hr tablet Take 2 tablets (100 mg total) by mouth in the morning and at bedtime. Take with or immediately following a meal. 01/29/21  Yes Almyra Deforest, PA  pregabalin (LYRICA) 50 MG capsule Take 50 mg by mouth 2 (two) times daily.   Yes [provider]  RABEprazole (ACIPHEX) 20 MG tablet Take 1 tablet (20 mg total) by mouth 2 (two) times daily. 04/22/12  Yes Lafayette Dragon, MD  tamsulosin (FLOMAX) 0.4 MG CAPS capsule Take 0.4 mg by mouth at bedtime. 07/17/16  Yes [provider]  TRESIBA FLEXTOUCH 100 UNIT/ML FlexTouch Pen Inject 20 Units into the skin at bedtime. 09/29/19  Yes [provider]  TRULICITY 1.5 NL/9.7QB SOPN Inject 1.5 mg into the skin once a week. 05/07/21  Yes [provider]  valsartan (DIOVAN) 320 MG tablet Take 320 mg by mouth daily.   Yes [provider]  venlafaxine XR (EFFEXOR-XR) 150 MG 24 hr capsule Take 150 mg by mouth 2 (two) times daily.   Yes [provider]  zaleplon (SONATA) 5 MG capsule Take 5 mg by mouth at bedtime.   Yes [provider]    ___________________________________________________________________________________________________ Physical Exam: Vitals with BMI 07/24/2021 07/24/2021 07/24/2021  Height - - -  Weight - - -  BMI - - -  Systolic [phone]  Diastolic 63 60 60  Pulse 58 58 57     1. General:  in No  Acute distress   Chronically ill   -appearing 2. Psychological: Alert and  Oriented 3. Head/ENT:   Moist  Mucous Membranes                          Head Non traumatic, neck supple                          Poor Dentition 4. SKIN: normal   Skin turgor,  Skin  clean Dry and intact no rash 5. Heart: Regular rate and rhythm no  Murmur, no Rub or gallop 6. Lungs:  , no wheezes some  crackles   7. Abdomen: Soft,  non-tender, Non distended   obese   8. Lower extremities: no clubbing, cyanosis, trace edema Right foot in a boot 9. Neurologically  strength 5 out of 5 in all 4 extremities cranial nerves II through XII intact 10. MSK: Normal range of motion    Chart has been reviewed  ______________________________________________________________________________________________  Assessment/Plan  70 y.o. female with medical history significant of  CHF, A.fib DM2, Gastroparesis, GERD, HTN, HLD, OSA fatty liver disease, IBS  Admitted for acute on chronic diastolic CHF  Present on Admission:  Acute on chronic diastolic CHF (congestive heart failure) ([place]) - - Pt diagnosed with CHF based on presence of the following:  PND, OA, Pulmonary edema on CXR, and   bilateral leg edema, DOE,    With noted response to IV diuretic in ER  admit on telemetry,  cycle cardiac enzymes, Troponin  20  obtain serial ECG  to evaluate for ischemia as a cause of heart failure  monitor daily weight: There were no vitals filed for this visit. Last BNP BNP (last 3 results) Recent Labs    12/09/20 0337 04/26/21 0949 07/24/21 1355  BNP 530.1* 663.8* 868.1*      diurese with IV lasix and monitor orthostatics and creatinine to avoid over diuresis.  Order echogram to evaluate EF and valves  ACE/ARBi ordered    Cardiology emailed   Obesity (BMI 30-39.9) - follow up as an outpt   Hyperlipidemia - stable   Essential hypertension resume home meds hold Toprol given bradycardia   GERD - stable   OSA (obstructive sleep apnea) -  CPAP   Diabetic peripheral neuropathy (HCC) - conitnue home meds   Paroxysmal atrial fibrillation ([place]) - hold eliquis given dropping hg  Anemia -noted hemoglobin drifting down.  Hold Eliquis for now obtain anemia panel Hemoccult stool repeat  CBC will likely need transfusion given worsening shortness of breath may be secondary to symptomatic anemia If evidence of GI blood loss will need GI consult   Acute on chronic respiratory failure with hypoxia (HCC) -  this patient has acute respiratory failure with Hypoxia  as documented by the presence of following: O2 saturatio< 90% on RA   Likely due to:  CHF exacerbation,   Provide O2 therapy and titrate as needed  Continuous pulse ox   check Pulse ox with ambulation prior to discharge   may need  TC consult for home O2 set up     Hypokalemia -- will replace and repeat in AM,  check magnesium level and replace as needed   Hypophosphatemia - will replace and repeat in AM,   Other plan as per orders.  DVT prophylaxis:  scd   Code Status:    Code Status: Prior FULL CODE  care as per patient   I had personally discussed CODE STATUS with patient     Family Communication:   Family not at  Bedside    Disposition Plan:         To home once workup is complete and patient is stable   Following barriers for discharge:                            Electrolytes corrected                                                                                      Will need to be able to tolerate PO                            Will likely need home health, home O2, set up                           Will need consultants to evaluate patient prior to discharge                       Would benefit from PT/OT eval prior to DC  Ordered  Consults called:  emailed Cardiology   Admission status:  ED Disposition     ED Disposition  Admit   Condition  --   [place]: [place] [[id number]]  Level of Care: Telemetry Cardiac [[id number]]  May admit patient to [facility] or [facility] if equivalent level of care is available:: No  Covid Evaluation: Confirmed COVID Negative  Diagnosis: CHF exacerbation [facility]) [[id number]]  Admitting Physician:  Toy Baker [[id number]]  Attending Physician: Toy Baker [[id number]]  Estimated length of stay: past midnight tomorrow  Certification:: I certify this patient will need inpatient services for at least 2 midnights          inpatient     I Expect 2 midnight stay secondary to severity of patient's current illness need for inpatient interventions justified by the following:  hemodynamic instability despite optimal treatment (hypoxia, )   Severe lab/radiological/exam abnormalities including:    CHFon CXR and extensive comorbidities including:    CHF DM2  CAD   Chronic anticoagulation  That are currently affecting medical management.   I expect  patient to be hospitalized for 2 midnights requiring inpatient medical care.  Patient is at high risk for adverse outcome (such as loss of life or disability) if not treated.  Indication for inpatient stay as follows:   Hemodynamic instability despite maximal medical therapy,    New or worsening hypoxia  Need for IV diuretics    Level of care     tele  For   24H      Lab Results  Component Value Date   [place] NEGATIVE 07/24/2021     Precautions: admitted as   Covid Negative     PPE: Used by the provider:   N95  eye Goggles,  Gloves    Harshan Kearley 07/25/2021, 3:12 AM    [facility]     after 2 AM please page floor coverage PA If 7AM-7PM, please contact the day team taking care of the patient using [email or web address]   Patient was evaluated in the context of the global COVID-19 pandemic, which necessitated consideration that the patient might be at risk for infection with the SARS-CoV-2 virus that causes COVID-19. Institutional protocols and algorithms that pertain to the evaluation of patients at risk for COVID-19 are in a state of rapid change based on information released by regulatory bodies including the CDC and federal and state organizations. These policies and algorithms were followed during the  patient's care.

## 2021-07-24 NOTE — ED Notes (Signed)
Pt wheeled to the restroom

## 2021-07-24 NOTE — ED Notes (Signed)
Contacted lab to follow-up on troponin.

## 2021-07-24 NOTE — ED Notes (Signed)
Provided pt with bag lunch and water.

## 2021-07-25 ENCOUNTER — Inpatient Hospital Stay (HOSPITAL_COMMUNITY): Payer: Medicare Other

## 2021-07-25 DIAGNOSIS — I5033 Acute on chronic diastolic (congestive) heart failure: Secondary | ICD-10-CM

## 2021-07-25 DIAGNOSIS — E1140 Type 2 diabetes mellitus with diabetic neuropathy, unspecified: Secondary | ICD-10-CM

## 2021-07-25 DIAGNOSIS — K219 Gastro-esophageal reflux disease without esophagitis: Secondary | ICD-10-CM

## 2021-07-25 DIAGNOSIS — E1169 Type 2 diabetes mellitus with other specified complication: Secondary | ICD-10-CM

## 2021-07-25 DIAGNOSIS — D649 Anemia, unspecified: Secondary | ICD-10-CM | POA: Diagnosis present

## 2021-07-25 LAB — CBC
HCT: 32.5 % — ABNORMAL LOW (ref 36.0–46.0)
HCT: 31.8 % — ABNORMAL LOW (ref 36.0–46.0)
HCT: 31.7 % — ABNORMAL LOW (ref 36.0–46.0)
HCT: 31.8 % — ABNORMAL LOW (ref 36.0–46.0)
Hemoglobin: 10.5 g/dL — ABNORMAL LOW (ref 12.0–15.0)
Hemoglobin: 10.1 g/dL — ABNORMAL LOW (ref 12.0–15.0)
Hemoglobin: 10.2 g/dL — ABNORMAL LOW (ref 12.0–15.0)
Hemoglobin: 10 g/dL — ABNORMAL LOW (ref 12.0–15.0)
MCH: 26.6 pg (ref 26.0–34.0)
MCH: 26.2 pg (ref 26.0–34.0)
MCH: 26.2 pg (ref 26.0–34.0)
MCH: 25.9 pg — ABNORMAL LOW (ref 26.0–34.0)
MCHC: 32.3 g/dL (ref 30.0–36.0)
MCHC: 31.8 g/dL (ref 30.0–36.0)
MCHC: 32.2 g/dL (ref 30.0–36.0)
MCHC: 31.4 g/dL (ref 30.0–36.0)
MCV: 82.3 fL (ref 80.0–100.0)
MCV: 82.6 fL (ref 80.0–100.0)
MCV: 81.5 fL (ref 80.0–100.0)
MCV: 82.4 fL (ref 80.0–100.0)
Platelets: 238 10*3/uL (ref 150–400)
Platelets: 237 10*3/uL (ref 150–400)
Platelets: 255 10*3/uL (ref 150–400)
Platelets: 255 10*3/uL (ref 150–400)
RBC: 3.95 MIL/uL (ref 3.87–5.11)
RBC: 3.85 MIL/uL — ABNORMAL LOW (ref 3.87–5.11)
RBC: 3.89 MIL/uL (ref 3.87–5.11)
RBC: 3.86 MIL/uL — ABNORMAL LOW (ref 3.87–5.11)
RDW: 16.9 % — ABNORMAL HIGH (ref 11.5–15.5)
RDW: 17 % — ABNORMAL HIGH (ref 11.5–15.5)
RDW: 17.2 % — ABNORMAL HIGH (ref 11.5–15.5)
RDW: 17.1 % — ABNORMAL HIGH (ref 11.5–15.5)
WBC: 7.9 10*3/uL (ref 4.0–10.5)
WBC: 7.5 10*3/uL (ref 4.0–10.5)
WBC: 7.6 10*3/uL (ref 4.0–10.5)
WBC: 7.9 10*3/uL (ref 4.0–10.5)
nRBC: 0 % (ref 0.0–0.2)
nRBC: 0 % (ref 0.0–0.2)
nRBC: 0 % (ref 0.0–0.2)
nRBC: 0 % (ref 0.0–0.2)

## 2021-07-25 LAB — CBC WITH DIFFERENTIAL/PLATELET
Abs Immature Granulocytes: 0.03 10*3/uL (ref 0.00–0.07)
Basophils Absolute: 0 10*3/uL (ref 0.0–0.1)
Basophils Relative: 1 %
Eosinophils Absolute: 0.2 10*3/uL (ref 0.0–0.5)
Eosinophils Relative: 3 %
HCT: 23.2 % — ABNORMAL LOW (ref 36.0–46.0)
Hemoglobin: 7.1 g/dL — ABNORMAL LOW (ref 12.0–15.0)
Immature Granulocytes: 1 %
Lymphocytes Relative: 15 %
Lymphs Abs: 1 10*3/uL (ref 0.7–4.0)
MCH: 25.8 pg — ABNORMAL LOW (ref 26.0–34.0)
MCHC: 30.6 g/dL (ref 30.0–36.0)
MCV: 84.4 fL (ref 80.0–100.0)
Monocytes Absolute: 0.7 10*3/uL (ref 0.1–1.0)
Monocytes Relative: 11 %
Neutro Abs: 4.5 10*3/uL (ref 1.7–7.7)
Neutrophils Relative %: 69 %
Platelets: 200 10*3/uL (ref 150–400)
RBC: 2.75 MIL/uL — ABNORMAL LOW (ref 3.87–5.11)
RDW: 17.6 % — ABNORMAL HIGH (ref 11.5–15.5)
WBC: 6.4 10*3/uL (ref 4.0–10.5)
nRBC: 0 % (ref 0.0–0.2)

## 2021-07-25 LAB — BASIC METABOLIC PANEL
Anion gap: 9 (ref 5–15)
BUN: 14 mg/dL (ref 8–23)
CO2: 26 mmol/L (ref 22–32)
Calcium: 8.3 mg/dL — ABNORMAL LOW (ref 8.9–10.3)
Chloride: 102 mmol/L (ref 98–111)
Creatinine, Ser: 0.8 mg/dL (ref 0.44–1.00)
GFR, Estimated: >60 mL/min (ref >60)
Glucose, Bld: 161 mg/dL — ABNORMAL HIGH (ref 70–99)
Potassium: 3.3 mmol/L — ABNORMAL LOW (ref 3.5–5.1)
Sodium: 137 mmol/L (ref 135–145)

## 2021-07-25 LAB — FOLATE: Folate: 20.7 ng/mL (ref >5.9)

## 2021-07-25 LAB — LIPID PANEL
Cholesterol: 104 mg/dL (ref 0–200)
HDL: 26 mg/dL — ABNORMAL LOW (ref >40)
LDL Cholesterol: 61 mg/dL (ref 0–99)
Total CHOL/HDL Ratio: 4 RATIO
Triglycerides: 87 mg/dL (ref <150)
VLDL: 17 mg/dL (ref 0–40)

## 2021-07-25 LAB — ECHOCARDIOGRAM COMPLETE
Area-P 1/2: 5.54 cm2
Height: 65 in
S' Lateral: 2.8 cm
Weight: 2880 oz

## 2021-07-25 LAB — GLUCOSE, CAPILLARY
Glucose-Capillary: 124 mg/dL — ABNORMAL HIGH (ref 70–99)
Glucose-Capillary: 161 mg/dL — ABNORMAL HIGH (ref 70–99)
Glucose-Capillary: 187 mg/dL — ABNORMAL HIGH (ref 70–99)

## 2021-07-25 LAB — COMPREHENSIVE METABOLIC PANEL
ALT: 41 U/L (ref 0–44)
AST: 34 U/L (ref 15–41)
Albumin: 2.7 g/dL — ABNORMAL LOW (ref 3.5–5.0)
Alkaline Phosphatase: 88 U/L (ref 38–126)
Anion gap: 8 (ref 5–15)
BUN: 13 mg/dL (ref 8–23)
CO2: 26 mmol/L (ref 22–32)
Calcium: 8 mg/dL — ABNORMAL LOW (ref 8.9–10.3)
Chloride: 103 mmol/L (ref 98–111)
Creatinine, Ser: 0.93 mg/dL (ref 0.44–1.00)
GFR, Estimated: >60 mL/min (ref >60)
Glucose, Bld: 135 mg/dL — ABNORMAL HIGH (ref 70–99)
Potassium: 3.2 mmol/L — ABNORMAL LOW (ref 3.5–5.1)
Sodium: 137 mmol/L (ref 135–145)
Total Bilirubin: 1.2 mg/dL (ref 0.3–1.2)
Total Protein: 6.3 g/dL — ABNORMAL LOW (ref 6.5–8.1)

## 2021-07-25 LAB — MAGNESIUM: Magnesium: 1.8 mg/dL (ref 1.7–2.4)

## 2021-07-25 LAB — IRON AND TIBC
Iron: 28 ug/dL (ref 28–170)
Saturation Ratios: 7 % — ABNORMAL LOW (ref 10.4–31.8)
TIBC: 417 ug/dL (ref 250–450)
UIBC: 389 ug/dL

## 2021-07-25 LAB — OCCULT BLOOD X 1 CARD TO LAB, STOOL: Fecal Occult Bld: POSITIVE — AB

## 2021-07-25 LAB — RETICULOCYTES
Immature Retic Fract: 37.9 % — ABNORMAL HIGH (ref 2.3–15.9)
RBC.: 3.85 MIL/uL — ABNORMAL LOW (ref 3.87–5.11)
Retic Count, Absolute: 106.6 10*3/uL (ref 19.0–186.0)
Retic Ct Pct: 2.8 % (ref 0.4–3.1)

## 2021-07-25 LAB — PREPARE RBC (CROSSMATCH)

## 2021-07-25 LAB — CBG MONITORING, ED
Glucose-Capillary: 135 mg/dL — ABNORMAL HIGH (ref 70–99)
Glucose-Capillary: 155 mg/dL — ABNORMAL HIGH (ref 70–99)

## 2021-07-25 LAB — VITAMIN B12: Vitamin B-12: 1011 pg/mL — ABNORMAL HIGH (ref 180–914)

## 2021-07-25 LAB — TSH: TSH: 1.9 u[IU]/mL (ref 0.350–4.500)

## 2021-07-25 LAB — FERRITIN: Ferritin: 69 ng/mL (ref 11–307)

## 2021-07-25 LAB — PHOSPHORUS: Phosphorus: 3.6 mg/dL (ref 2.5–4.6)

## 2021-07-25 MED ORDER — ALPRAZOLAM 0.5 MG PO TABS
0.5000 mg | ORAL_TABLET | Freq: Every evening | ORAL | Status: DC | PRN
  Administered 2021-07-25 – 2021-07-28 (×3): 0.5 mg via ORAL
  Filled 2021-07-25: qty 1
  Filled 2021-07-25: qty 2
  Filled 2021-07-25: qty 1

## 2021-07-25 MED ORDER — POTASSIUM CHLORIDE CRYS ER 20 MEQ PO TBCR: 40.0000 meq | EXTENDED_RELEASE_TABLET | Freq: Two times a day (BID) | ORAL | Status: DC

## 2021-07-25 MED ORDER — TAMSULOSIN HCL 0.4 MG PO CAPS
0.4000 mg | ORAL_CAPSULE | Freq: Every day | ORAL | Status: DC
  Administered 2021-07-25 – 2021-07-28 (×5): 0.4 mg via ORAL
  Filled 2021-07-25 (×5): qty 1

## 2021-07-25 MED ORDER — POTASSIUM CHLORIDE 10 MEQ/100ML IV SOLN: 10.0000 meq | INTRAVENOUS | Status: DC

## 2021-07-25 MED ORDER — ACETAMINOPHEN 325 MG PO TABS: 650.0000 mg | ORAL_TABLET | Freq: Four times a day (QID) | ORAL | Status: DC | PRN

## 2021-07-25 MED ORDER — EMPAGLIFLOZIN 10 MG PO TABS
10.0000 mg | ORAL_TABLET | Freq: Every day | ORAL | Status: DC
  Administered 2021-07-25 – 2021-07-29 (×5): 10 mg via ORAL
  Filled 2021-07-25 (×5): qty 1

## 2021-07-25 MED ORDER — SPIRONOLACTONE 25 MG PO TABS
25.0000 mg | ORAL_TABLET | Freq: Every day | ORAL | Status: DC
  Administered 2021-07-25 – 2021-07-29 (×5): 25 mg via ORAL
  Filled 2021-07-25 (×5): qty 1

## 2021-07-25 MED ORDER — PREGABALIN 25 MG PO CAPS
50.0000 mg | ORAL_CAPSULE | Freq: Two times a day (BID) | ORAL | Status: DC
  Administered 2021-07-25 – 2021-07-29 (×10): 50 mg via ORAL
  Filled 2021-07-25 (×10): qty 2

## 2021-07-25 MED ORDER — APIXABAN 5 MG PO TABS
5.0000 mg | ORAL_TABLET | Freq: Two times a day (BID) | ORAL | Status: DC
  Administered 2021-07-25: 02:00:00 5 mg via ORAL
  Filled 2021-07-25 (×2): qty 1

## 2021-07-25 MED ORDER — INSULIN GLARGINE-YFGN 100 UNIT/ML ~~LOC~~ SOLN
20.0000 [IU] | Freq: Every day | SUBCUTANEOUS | Status: DC
  Administered 2021-07-25 – 2021-07-28 (×4): 20 [IU] via SUBCUTANEOUS
  Filled 2021-07-25 (×6): qty 0.2

## 2021-07-25 MED ORDER — SODIUM CHLORIDE 0.9 % IV SOLN: 250.0000 mL | INTRAVENOUS | Status: DC | PRN

## 2021-07-25 MED ORDER — SODIUM CHLORIDE 0.9% IV SOLUTION: Freq: Once | INTRAVENOUS | Status: DC

## 2021-07-25 MED ORDER — POTASSIUM PHOSPHATES 15 MMOLE/5ML IV SOLN
15.0000 mmol | Freq: Once | INTRAVENOUS | Status: AC
  Administered 2021-07-25: 04:00:00 15 mmol via INTRAVENOUS
  Filled 2021-07-25: qty 5

## 2021-07-25 MED ORDER — IRBESARTAN 300 MG PO TABS
300.0000 mg | ORAL_TABLET | Freq: Every day | ORAL | Status: DC
  Administered 2021-07-25 – 2021-07-29 (×5): 300 mg via ORAL
  Filled 2021-07-25 (×5): qty 1

## 2021-07-25 MED ORDER — DIPHENHYDRAMINE HCL 25 MG PO CAPS
25.0000 mg | ORAL_CAPSULE | Freq: Once | ORAL | Status: AC
  Administered 2021-07-25: 04:00:00 25 mg via ORAL
  Filled 2021-07-25: qty 1

## 2021-07-25 MED ORDER — ACETAMINOPHEN 325 MG PO TABS
650.0000 mg | ORAL_TABLET | Freq: Once | ORAL | Status: AC
  Administered 2021-07-25: 04:00:00 650 mg via ORAL
  Filled 2021-07-25: qty 2

## 2021-07-25 MED ORDER — SODIUM CHLORIDE 0.9% FLUSH
3.0000 mL | Freq: Two times a day (BID) | INTRAVENOUS | Status: DC
  Administered 2021-07-25 – 2021-07-28 (×8): 3 mL via INTRAVENOUS

## 2021-07-25 MED ORDER — SODIUM CHLORIDE 0.9% FLUSH: 3.0000 mL | INTRAVENOUS | Status: DC | PRN

## 2021-07-25 MED ORDER — FUROSEMIDE 10 MG/ML IJ SOLN
40.0000 mg | Freq: Once | INTRAMUSCULAR | Status: AC
  Administered 2021-07-25: 21:00:00 40 mg via INTRAVENOUS
  Filled 2021-07-25: qty 4

## 2021-07-25 MED ORDER — POTASSIUM CHLORIDE 10 MEQ/100ML IV SOLN
10.0000 meq | INTRAVENOUS | Status: AC
  Administered 2021-07-25 (×2): 10 meq via INTRAVENOUS
  Filled 2021-07-25 (×2): qty 100

## 2021-07-25 MED ORDER — MAGNESIUM SULFATE 2 GM/50ML IV SOLN
2.0000 g | Freq: Once | INTRAVENOUS | Status: AC
  Administered 2021-07-25: 21:00:00 2 g via INTRAVENOUS
  Filled 2021-07-25: qty 50

## 2021-07-25 MED ORDER — POTASSIUM CHLORIDE CRYS ER 20 MEQ PO TBCR
40.0000 meq | EXTENDED_RELEASE_TABLET | Freq: Three times a day (TID) | ORAL | Status: DC
  Administered 2021-07-25 – 2021-07-27 (×4): 40 meq via ORAL
  Filled 2021-07-25 (×5): qty 2

## 2021-07-25 MED ORDER — PERFLUTREN LIPID MICROSPHERE
1.0000 mL | INTRAVENOUS | Status: AC | PRN
Start: 2021-07-25 — End: 2021-07-25
  Administered 2021-07-25: 09:00:00 2 mL via INTRAVENOUS
  Filled 2021-07-25: qty 10

## 2021-07-25 MED ORDER — FUROSEMIDE 10 MG/ML IJ SOLN
40.0000 mg | Freq: Every day | INTRAMUSCULAR | Status: DC
  Administered 2021-07-25: 06:00:00 40 mg via INTRAVENOUS
  Filled 2021-07-25 (×2): qty 4

## 2021-07-25 MED ORDER — METOPROLOL TARTRATE 25 MG PO TABS
25.0000 mg | ORAL_TABLET | Freq: Two times a day (BID) | ORAL | Status: DC
  Administered 2021-07-25 – 2021-07-29 (×9): 25 mg via ORAL
  Filled 2021-07-25 (×10): qty 1

## 2021-07-25 MED ORDER — POTASSIUM CHLORIDE CRYS ER 20 MEQ PO TBCR
40.0000 meq | EXTENDED_RELEASE_TABLET | Freq: Once | ORAL | Status: AC
  Administered 2021-07-25: 15:00:00 40 meq via ORAL
  Filled 2021-07-25: qty 2

## 2021-07-25 MED ORDER — ATORVASTATIN CALCIUM 10 MG PO TABS
20.0000 mg | ORAL_TABLET | Freq: Every day | ORAL | Status: DC
  Administered 2021-07-25 – 2021-07-29 (×5): 20 mg via ORAL
  Filled 2021-07-25 (×5): qty 2

## 2021-07-25 MED ORDER — VENLAFAXINE HCL ER 75 MG PO CP24
150.0000 mg | ORAL_CAPSULE | Freq: Two times a day (BID) | ORAL | Status: DC
  Administered 2021-07-25 – 2021-07-29 (×10): 150 mg via ORAL
  Filled 2021-07-25 (×2): qty 1
  Filled 2021-07-25 (×4): qty 2
  Filled 2021-07-25: qty 1
  Filled 2021-07-25 (×4): qty 2

## 2021-07-25 MED ORDER — ACETAMINOPHEN 650 MG RE SUPP: 650.0000 mg | Freq: Four times a day (QID) | RECTAL | Status: DC | PRN

## 2021-07-25 NOTE — Consult Note (Signed)
Cardiology Consultation:   Patient ID: Alexis [name] MRN: [id number]; DOB: 02/15/1951  Admit date: 07/24/2021 Date of Consult: 07/25/2021  PCP:  Alexis Pretty, MD   [facility] Cardiologist:  Alexis Casino, MD   {    Patient Profile:   Alexis [name] is a 70 y.o. female with a hx of diastolic CHF, CAD s/p cardiac cath on 05/26/2014 that showed normal coronary arteries, paroxysmal atrial fibrillation on amiodarone, HLD, HTN, obesity, and type 2 DM who is being seen 07/25/2021 for the evaluation of acute on chronic diastolic CHF at the request of Alexis [name].  History of Present Illness:   Alexis [name] presented to the ED on 07/24/2021 complaining of fatigue, chest pain, and feeling weak over the past week. Patient had taken her pulse ox at home on 12/12 and it was in the low 80s. Patient reported needing to sleep with several pillows at night. Denies chest pain, palpitations. Reports good compliance with her diuretic and anticoagulant at home.   In the ED, labs showed Na 136, K 2.8, calcium 8.8, hemoglobin 9.4. BNP was elevated to 868.1. HSTN 29>>20. EKG showed sinus bradycardia with a nonspecific intra-ventricular conduction block. Chest x-ray showed cardiomegally, pulmonary venous hypertension consistent with congestive heart failure. Patient was given potassium phosphate and potassium chloride. Began diuresis with Lasix. Patient is net -207.2 mL since admission.   Patient was most recently was hospitalized from 04/26/2021-04/29/2021 for treatment of acute hypoxic respiratory failure secondary to acute on chronic diastolic congestive heart failure. Patient presented to the ED on 04/26/2021 and reported that she felt exhausted and short of breath.BNP was elevated to 663.8. Chest x-ray showed pulmonary vascular congestion. Echo showed a LVEF of 65-70% and normal LV function. Mildly elevated pulmonary artery systolic pressure. Patient diuresed well with Lasix in patient.  After being discharged, patient was seen outpatient by Alexis [name] on 05/14/2021. Patient continued to have trace edema in bilateral lower extremities and was started on lasix daily.  Patient underwent a cardiac catheterization on 05/26/2014 that found normal coronary arteries.    Past Medical History:  Diagnosis Date   Anxiety    Arthritis    "knees" (05/25/2014)   Chronic back pain    Depression    Diabetes mellitus type II    Diabetic peripheral neuropathy ([place]) 10/20/2018   Diverticulitis    Fatty liver    Fibromyalgia    Gastric polyp    hyperplastic   Gastroparesis    "recently dx'd" (05/25/2014)   GERD (gastroesophageal reflux disease)    H/O hiatal hernia    Hx of gastritis    Hyperlipidemia    Hypertension    Irritable bowel syndrome (IBS)    Memory difficulties 09/18/2017   Migraine without aura, without mention of intractable migraine without mention of status migrainosus    "related to allergies; have them in the spring and fall" (05/25/2014)   Obesity    Sleep apnea    "wore mask; took it off in my sleep; quit wearing it" (05/25/2014)   Uterine cancer ([place]) dx'd 2000   surg only   Vasculitis ([place])    "irritates my legs"   Wears glasses     Past Surgical History:  Procedure Laterality Date   ANTERIOR CERVICAL DECOMPRESSION/DISCECTOMY FUSION 4 LEVELS N/A 05/07/2018   Procedure: ANTERIOR CERVICAL DECOMPRESSION FUSION, CERVICAL 4-5,CERVICAL 5-6, CERVICAL 6-7 WITH INSTRUMENTATION AND ALLOGRAFT;  Surgeon: Alexis Bob, MD;  Location: [facility];  Service: Orthopedics;  Laterality: N/A;   APPENDECTOMY  ~  1967   BREAST CYST EXCISION Right 1990   COLONOSCOPY     LAPAROSCOPIC CHOLECYSTECTOMY  1990   LEFT HEART CATHETERIZATION WITH CORONARY ANGIOGRAM N/A 05/26/2014   Procedure: LEFT HEART CATHETERIZATION WITH CORONARY ANGIOGRAM;  Surgeon: Alexis Sine, MD;  Location: [facility] CATH LAB;  Service: Cardiovascular;  Laterality: N/A;   SINUS SURGERY WITH INSTATRAK  2000   TOTAL  HIP ARTHROPLASTY Left 12/05/2020   Procedure: TOTAL HIP ARTHROPLASTY ANTERIOR APPROACH;  Surgeon: Alexis Can, MD;  Location: [facility];  Service: Orthopedics;  Laterality: Left;   TUBAL LIGATION  ~ 1982   VAGINAL HYSTERECTOMY  2000     Home Medications:  Prior to Admission medications   Medication Sig Start Date End Date Taking? Authorizing Provider  acetaminophen (TYLENOL) 325 MG tablet Take 2 tablets (650 mg total) by mouth every 6 (six) hours as needed for mild pain or moderate pain. 12/12/20  Yes Alexis Hanlon, MD  ALPRAZolam Duanne Moron) 0.5 MG tablet Take 0.5 mg by mouth at bedtime.   Yes [provider]  amiodarone (PACERONE) 200 MG tablet Take 1 tablet (200 mg total) by mouth daily. Patient taking differently: Take 200 mg by mouth 2 (two) times daily. 05/14/21  Yes Hilty, Nadean Corwin, MD  amLODipine (NORVASC) 5 MG tablet Take 5 mg by mouth every evening.   Yes [provider]  apixaban (ELIQUIS) 5 MG TABS tablet Take 1 tablet (5 mg total) by mouth 2 (two) times daily. 12/12/20  Yes Alexis Hanlon, MD  b complex vitamins tablet Take 1 tablet by mouth at bedtime.   Yes [provider]  FARXIGA 10 MG TABS tablet Take 10 mg by mouth daily. 09/10/19  Yes [provider]  fish oil-omega-3 fatty acids 1000 MG capsule Take 1 g by mouth at bedtime.    Yes [provider]  furosemide (LASIX) 20 MG tablet Take 1 tablet (20 mg total) by mouth daily. 05/14/21 08/12/21 Yes Hilty, Nadean Corwin, MD  insulin lispro (HUMALOG) 100 UNIT/ML KwikPen Inject 8 Units into the skin 3 (three) times daily as needed (high blood sugar). Per sliding scale   Yes [provider]  metFORMIN (GLUCOPHAGE-XR) 500 MG 24 hr tablet Take 500 mg by mouth daily. 08/24/19  Yes [provider]  metoprolol succinate (TOPROL-XL) 50 MG 24 hr tablet Take 2 tablets (100 mg total) by mouth in the morning and at bedtime. Take with or immediately following a meal. 01/29/21  Yes Alexis Deforest, PA   pregabalin (LYRICA) 50 MG capsule Take 50 mg by mouth 2 (two) times daily.   Yes [provider]  RABEprazole (ACIPHEX) 20 MG tablet Take 1 tablet (20 mg total) by mouth 2 (two) times daily. 04/22/12  Yes Lafayette Dragon, MD  tamsulosin (FLOMAX) 0.4 MG CAPS capsule Take 0.4 mg by mouth at bedtime. 07/17/16  Yes [provider]  TRESIBA FLEXTOUCH 100 UNIT/ML FlexTouch Pen Inject 20 Units into the skin at bedtime. 09/29/19  Yes [provider]  TRULICITY 1.5 WV/3.7TG SOPN Inject 1.5 mg into the skin once a week. 05/07/21  Yes [provider]  valsartan (DIOVAN) 320 MG tablet Take 320 mg by mouth daily.   Yes [provider]  venlafaxine XR (EFFEXOR-XR) 150 MG 24 hr capsule Take 150 mg by mouth 2 (two) times daily.   Yes [provider]  zaleplon (SONATA) 5 MG capsule Take 5 mg by mouth at bedtime.   Yes [provider]    Inpatient Medications: Scheduled  Meds:  sodium chloride   Intravenous Once   amiodarone  200 mg Oral Daily   furosemide  40 mg Intravenous Daily   insulin aspart  0-9 Units Subcutaneous Q4H   insulin glargine-yfgn  20 Units Subcutaneous QHS   irbesartan  300 mg Oral Daily   pregabalin  50 mg Oral BID   sodium chloride flush  3 mL Intravenous Q12H   tamsulosin  0.4 mg Oral QHS   venlafaxine XR  150 mg Oral BID   Continuous Infusions:  sodium chloride     potassium PHOSPHATE IVPB (in mmol) 43 mL/hr at 07/25/21 0545   PRN Meds: sodium chloride, acetaminophen **OR** acetaminophen, ALPRAZolam, perflutren lipid microspheres (DEFINITY) IV suspension, sodium chloride flush  Allergies:    Allergies  Allergen Reactions   Aspirin Other (See Comments)    Reaction: Vasculitis per MD   Codeine Itching and Rash    Tolerated Norco 12/07/20 with no reports of rash/itching. Patient endorsed allergy >30 yr ago and hasn't had reaction since.   Naproxen Nausea Only    Reaction: Makes stomach upset/irritated.   Sulfonamide  Derivatives Itching and Rash    Social History:   Social History   Socioeconomic History   Marital status: Married    Spouse name: Laquesha Holcomb   Number of children: 2   Years of education: Not on file   Highest education level: Some college, no degree  Occupational History   Occupation: Retired    Fish farm manager: RETIRED  Tobacco Use   Smoking status: Never   Smokeless tobacco: Never  Vaping Use   Vaping Use: Never used  Substance and Sexual Activity   Alcohol use: No   Drug use: No   Sexual activity: Not Currently  Other Topics Concern   Not on file  Social History Narrative   Daily Caffeine, Coke   Social Determinants of Health   Financial Resource Strain: Low Risk    Difficulty of Paying Living Expenses: Not very hard  Food Insecurity: No Food Insecurity   Worried About Charity fundraiser in the Last Year: Never true   Ran Out of Food in the Last Year: Never true  Transportation Needs: No Transportation Needs   Lack of Transportation (Medical): No   Lack of Transportation (Non-Medical): No  Physical Activity: Not on file  Stress: Not on file  Social Connections: Not on file  Intimate Partner Violence: Not on file    Family History:    Family History  Problem Relation Age of Onset   Breast cancer Sister    Colon polyps Father    Heart disease Father    Colon cancer Maternal Uncle    Ovarian cancer Maternal Aunt    Stomach cancer Maternal Aunt    Diabetes Maternal Aunt    Heart disease Maternal Uncle    Heart disease Other        Grandparents   Irritable bowel syndrome Daughter      ROS:  Please see the history of present illness.   All other ROS reviewed and negative.     Physical Exam/Data:   Vitals:   07/25/21 0630 07/25/21 0645 07/25/21 0700 07/25/21 0726  BP: (!) 114/98 (!) 127/54 (!) 145/112 (!) 111/91  Pulse: 65 62 61 62  Resp: (!) 21 18 17 18   Temp:    98.2 F (36.8 C)  TempSrc:    Oral  SpO2: 93% 94% 95% 95%  Weight:      Height:  Intake/Output Summary (Last 24 hours) at 07/25/2021 0937 Last data filed at 07/25/2021 0727 Gross per 24 hour  Intake 694.3 ml  Output 900 ml  Net -205.7 ml   Last 3 Weights 07/24/2021 07/03/2021 05/14/2021  Weight (lbs) 180 lb 183 lb 176 lb  Weight (kg) 81.647 kg 83.008 kg 79.833 kg     Body mass index is 29.95 kg/m.  General:  Well nourished, well developed, in no acute distress HEENT: normal Neck: JVD present Vascular: No carotid bruits; Distal pulses 2+ bilaterally Cardiac:  normal S1, S2; RRR; no murmur Lungs:  clear to auscultation bilaterally, crackles in both lung bases Abd: soft, nontender Ext: 2+ pitting edema in bilateral lower extremities  Musculoskeletal:  No deformities Skin: warm and dry  Neuro:  CNs 2-12 intact, no focal abnormalities noted Psych:  Normal affect   EKG:  The EKG was personally reviewed and demonstrates: Sinus rhythm with a nonspecific intra-ventricular conduction block  Telemetry:  Telemetry was personally reviewed and demonstrates: sinus rhythm   Relevant CV Studies:   Laboratory Data:  High Sensitivity Troponin:   Recent Labs  Lab 07/24/21 1638 07/24/21 1931  TROPONINIHS 29* 20*     Chemistry Recent Labs  Lab 07/24/21 1355 07/24/21 1638 07/24/21 2210  NA 136  --  136  K 2.8*  --  3.1*  CL 98  --  103  CO2 28  --  26  GLUCOSE 193*  --  205*  BUN 18  --  18  CREATININE 0.90  --  0.90  CALCIUM 8.8*  --  7.9*  MG  --  2.2  --   GFRNONAA >60  --  >60  ANIONGAP 10  --  7    Recent Labs  Lab 07/24/21 1931  PROT 4.5*  ALBUMIN 1.8*  AST 23  ALT 28  ALKPHOS 62  BILITOT 0.8   Lipids  Recent Labs  Lab 07/25/21 0237  CHOL 104  TRIG 87  HDL 26*  LDLCALC 61  CHOLHDL 4.0    Hematology Recent Labs  Lab 07/24/21 1355 07/24/21 2210 07/25/21 0237  WBC 9.0 8.0 6.4  RBC 3.72* 3.47* 2.75*  HGB 9.4* 9.1* 7.1*  HCT 30.4* 28.7* 23.2*  MCV 81.7 82.7 84.4  MCH 25.3* 26.2 25.8*  MCHC 30.9 31.7 30.6  RDW 17.2*  17.4* 17.6*  PLT 242 217 200   Thyroid  Recent Labs  Lab 07/25/21 0237  TSH 1.900    BNP Recent Labs  Lab 07/24/21 1355  BNP 868.1*    DDimer No results for input(s): DDIMER in the last 168 hours.   Radiology/Studies:  DG Chest 2 View  Result Date: 07/24/2021 CLINICAL DATA:  Shortness of breath and hypoxia EXAM: CHEST - 2 VIEW COMPARISON:  04/26/2021 FINDINGS: Cardiomegaly. Aortic atherosclerosis. Pulmonary venous hypertension. Bilateral pleural effusions with dependent pulmonary atelectasis. Findings consistent with congestive heart failure. Upper lungs are otherwise clear. IMPRESSION: Congestive heart failure. Cardiomegaly, aortic atherosclerosis, pulmonary venous hypertension, small pleural effusions and dependent atelectasis. Electronically Signed   By: Nelson Chimes M.D.   On: 07/24/2021 14:39     Assessment and Plan:    Acute on chronic respiratory failure with hypoxia in the setting of acute on chronic diastolic CHF : Patient's O2 saturation <90% on room air. Patient is on a nasal cannula at 2 L/min. BNP elevated to 868.1. Chest x-ray showed cardiomegaly and pulmonary venous hypertension consistent with congestive heart failure. HSTN 29>>20.  - Echo ordered, pending results  - Hold  lasix until K>3.5  - Start aldactone 25 mg - OK to hold norvasc to allow more room for diuresis as K allows. Will plan to resume as BP allows.  - Start jardiance 10 mg - Resume metoprolol at lower dose, 25 mg BID, given history of atrial fibrillation as BP stable and HR is stable in the 60s   Hypokalemia: Potassium 2.8>>3.1 on 12/13. Magnesium 2.2 -  recheck potassium 3.2, supplement K >4  Paroxysmal Atrial Fibrillation: Patient is in sinus rhythm according to EKG  - Continue amiodarone - Xarelto held due to anemia   - Resume metoprolol at a lower dose  HLD : Lipid profile on 07/25/2021 showed LDL 61, HDL 26, Triglycerides 87 - Patient was previously taking atorvastatin and it is unclear  why she stopped.  - Start lipitor 20 mg  HTN:  - Continue avapro  - Resume metoprolol at lower dose, 25 mg BID, given history of atrial fibrillation  - Ok to hold norvasc to allow more diuresis with lasix once K allows   DM:  - Management per primary team with insulin   Anemia: Hemoglobin 9.4>>9.1>>7.1 - Xarelto held  - 1 unit packed RBC transfusion completed 12/14, hemoglobin 10 following transfusion  - Hemoccult stool pending, GI consult   Hypophosphatemia: 2.3 on 12/13 - 3.6 on 12/14    Risk Assessment/Risk Scores:        New York Heart Association (NYHA) Functional Class NYHA Class III        For questions or updates, please contact [facility] Please consult [email or web address] for contact info under    Signed, Margie Billet, PA-C  07/25/2021 9:37 AM

## 2021-07-25 NOTE — ED Notes (Addendum)
MD and RN informed of patient fall

## 2021-07-25 NOTE — TOC Initial Note (Signed)
Transition of Care [facility]) - Initial/Assessment Note    Patient Details  Name: Alexis [name] MRN: [id number] Date of Birth: February 11, 1951  Transition of [facility]) CM/SW Contact:    Zenon Mayo, RN Phone Number: 07/25/2021, 4:33 PM  Clinical Narrative:                 Patient is from home with spouse, pt eval recs outpatient physical therapy, NCM spoke with her and she would like to do outpatient therapy at the orthopedic office she went to before, she could not think of the name of it but will check with her spouse this evening and let this NCM know tomorrow    Expected Discharge Plan: Home/Self Care Barriers to Discharge: Continued Medical Work up   Patient Goals and CMS Choice Patient states their goals for this hospitalization and ongoing recovery are:: return home with outpt therapy   Choice offered to / list presented to : NA  Expected Discharge Plan and Services Expected Discharge Plan: Home/Self Care   Discharge Planning Services: CM Consult Post Acute Care Choice: NA Living arrangements for the past 2 months: Single Family Home                   DME Agency: NA       HH Arranged: NA          Prior Living Arrangements/Services Living arrangements for the past 2 months: Single Family Home Lives with:: Spouse Patient language and need for interpreter reviewed:: Yes Do you feel safe going back to the place where you live?: Yes      Need for Family Participation in Patient Care: Yes (Comment) Care giver support system in place?: Yes (comment)   Criminal Activity/Legal Involvement Pertinent to Current Situation/Hospitalization: No - Comment as needed  Activities of Daily Living Home Assistive Devices/Equipment: Shower chair without back, Walker (specify type) (front wheel) ADL Screening (condition at time of admission) Patient's cognitive ability adequate to safely complete daily activities?: Yes Is the patient deaf or have difficulty hearing?:  No Does the patient have difficulty seeing, even when wearing glasses/contacts?: No Does the patient have difficulty concentrating, remembering, or making decisions?: No Patient able to express need for assistance with ADLs?: Yes Does the patient have difficulty dressing or bathing?: Yes Independently performs ADLs?: Yes (appropriate for developmental age) Does the patient have difficulty walking or climbing stairs?: Yes Weakness of Legs: Left Weakness of Arms/Hands: None  Permission Sought/Granted                  Emotional Assessment   Attitude/Demeanor/Rapport: Engaged Affect (typically observed): Appropriate Orientation: : Oriented to Place, Oriented to  Time, Oriented to Situation, Oriented to Self Alcohol / Substance Use: Not Applicable Psych Involvement: No (comment)  Admission diagnosis:  CHF exacerbation ([place]) [I50.9] Acute respiratory failure with hypoxia ([place]) [J96.01] Acute on chronic congestive heart failure, unspecified heart failure type ([place]) [I50.9] Patient Active Problem List   Diagnosis Date Noted   Anemia 07/25/2021   CHF exacerbation ([place]) 07/24/2021   Acute on chronic respiratory failure with hypoxia ([place]) 07/24/2021   Acute on chronic diastolic CHF (congestive heart failure) ([place]) 07/24/2021   Hypokalemia 07/24/2021   New onset of congestive heart failure ([place]) 04/26/2021   Hypotension 01/77/9390   Acute metabolic encephalopathy 30/04/2329   Paroxysmal atrial fibrillation ([place])    Acute blood loss anemia    Subcapital fracture of left femur ([place]) 12/04/2020   Leukocytosis 12/04/2020   Fall at  home, initial encounter 12/04/2020   Diabetic peripheral neuropathy ([place]) 10/20/2018   Cervical myelopathy ([place]) 05/07/2018   Type 2 diabetes mellitus ([place]) 02/18/2018   Memory difficulties 09/18/2017   Coronary artery calcification seen on computed tomography 04/09/2017   Word finding difficulty 09/05/2016   Cervicogenic headache 09/05/2016   Palpitation  08/22/2016   Bilateral leg edema 08/22/2016   Obesity (BMI 30-39.9) 05/25/2014   Precordial chest pain 05/25/2014   Migraine without aura, without mention of intractable migraine without mention of status migrainosus 03/18/2013   Chest pain 01/29/2013   Constipation 02/24/2012   DM2 (diabetes mellitus, type 2) ([place]) 08/24/2010   Hyperlipidemia 08/24/2010   ANXIETY 08/24/2010   Anxiety and depression 08/24/2010   Essential hypertension 08/24/2010   GERD 08/24/2010   IRRITABLE BOWEL SYNDROME 08/24/2010   FATTY LIVER DISEASE 08/24/2010   ARTHRITIS 08/24/2010   Fibromyalgia 08/24/2010   OSA (obstructive sleep apnea) 08/24/2010   ABDOMINAL PAIN-RUQ 08/24/2010   UTERINE CANCER, HX OF 08/24/2010   GASTRITIS, HX OF 08/24/2010   PCP:  Deland Pretty, MD Pharmacy:   [id number] - [place], [address] [place] [address] Phone: [phone] Fax: [phone]     Social Determinants of Health (SDOH) Interventions    Readmission Risk Interventions Readmission Risk Prevention Plan 07/25/2021  Transportation Screening Complete  PCP or Specialist Appt within 3-5 Days Complete  HRI or Home Care Consult Complete  Social Work Consult for [place] Planning/Counseling Complete  Palliative Care Screening Not Applicable  Medication Review Press photographer) Complete  Some recent data might be hidden

## 2021-07-25 NOTE — Evaluation (Addendum)
Physical Therapy Evaluation Patient Details Name: Alexis [name] MRN: [id number] DOB: 10-06-1950 Today's Date: 07/25/2021  History of Present Illness  Pt is a 70 y.o. female who presented 07/24/21 with fatigue, weakness, hypoxia. CXR consistent with mild pulmonary edema. PMH: CHF, recent R foot fx, chronic back pain; DM; fibromyalgia; HTN; HLD; IBS; OSA; obesity;  and remote uterine cancer  Clinical Impression  Pt admitted secondary to problem above with deficits below. Pt requiring min guard A For mobility tasks using RW. Decreased safety awareness noted and educated about using RW at all times and how to appropriately use. Pt reports she would like to return to outpatient at d/c to work on balance and strengthening. Will continue to follow acutely.        Recommendations for follow up therapy are one component of a multi-disciplinary discharge planning process, led by the attending physician.  Recommendations may be updated based on patient status, additional functional criteria and insurance authorization.  Follow Up Recommendations Outpatient PT    Assistance Recommended at Discharge Frequent or constant Supervision/Assistance  Functional Status Assessment Patient has had a recent decline in their functional status and demonstrates the ability to make significant improvements in function in a reasonable and predictable amount of time.  Equipment Recommendations  None recommended by PT    Recommendations for Other Services       Precautions / Restrictions Precautions Precautions: Fall Precaution Comments: hx of multiple falls Restrictions Weight Bearing Restrictions: Yes RLE Weight Bearing: Weight bearing as tolerated Other Position/Activity Restrictions: WBAT in CAM boot      Mobility  Bed Mobility Overal bed mobility: Modified Independent                  Transfers Overall transfer level: Needs assistance Equipment used: Rolling walker (2 wheels) Transfers:  Sit to/from Stand Sit to Stand: Min guard           General transfer comment: Min guard for safety    Ambulation/Gait Ambulation/Gait assistance: Min guard Gait Distance (Feet): 100 Feet Assistive device: Rolling walker (2 wheels) Gait Pattern/deviations: Step-through pattern;Decreased stride length Gait velocity: Decreased     General Gait Details: Min guard for safety. Cues for safe use of RW. Educated about using RW at home to increase safety, even when out of the house, and how to use appropriately.  Stairs            Wheelchair Mobility    Modified Rankin (Stroke Patients Only)       Balance Overall balance assessment: Needs assistance Sitting-balance support: No upper extremity supported Sitting balance-Leahy Scale: Good     Standing balance support: Bilateral upper extremity supported Standing balance-Leahy Scale: Poor Standing balance comment: fair static standing to manage clothing after toileting, poor dynamic requiring RW                             Pertinent Vitals/Pain Pain Assessment: Faces Faces Pain Scale: No hurt    Home Living Family/patient expects to be discharged to:: Private residence Living Arrangements: Spouse/significant other;Children (daughter) Available Help at Discharge: Family;Available 24 hours/day Type of Home: House Home Access: Stairs to enter Entrance Stairs-Rails: Right;Left;Can reach both Entrance Stairs-Number of Steps: 6   Home Layout: One level Home Equipment: Conservation officer, nature (2 wheels);Cane - single point;Shower seat;Grab bars - toilet;Grab bars - tub/shower;Hand held shower head      Prior Function Prior Level of Function : Independent/Modified Independent  Mobility Comments: Using RW for mobility ADLs Comments: independent in self care     Hand Dominance   Dominant Hand: Right    Extremity/Trunk Assessment   Upper Extremity Assessment Upper Extremity Assessment: Defer to OT  evaluation    Lower Extremity Assessment Lower Extremity Assessment: Generalized weakness;RLE deficits/detail RLE Deficits / Details: Recent R foot fx.    Cervical / Trunk Assessment Cervical / Trunk Assessment: Normal  Communication   Communication: No difficulties  Cognition Arousal/Alertness: Awake/alert Behavior During Therapy: WFL for tasks assessed/performed Overall Cognitive Status: Impaired/Different from baseline Area of Impairment: Safety/judgement                         Safety/Judgement: Decreased awareness of deficits;Decreased awareness of safety     General Comments: educated pt to use RW with both handles, per daughter she was only opening one side at home        General Comments General comments (skin integrity, edema, etc.): Pt's daughter and husband present    Exercises     Assessment/Plan    PT Assessment Patient needs continued PT services  PT Problem List Decreased strength;Decreased activity tolerance;Decreased balance;Decreased mobility;Decreased knowledge of use of DME;Decreased knowledge of precautions       PT Treatment Interventions DME instruction;Gait training;Functional mobility training;Therapeutic activities;Therapeutic exercise;Balance training;Stair training    PT Goals (Current goals can be found in the Care Plan section)  Acute Rehab PT Goals Patient Stated Goal: to go home PT Goal Formulation: With patient/family Time For Goal Achievement: 08/08/21 Potential to Achieve Goals: Good    Frequency Min 3X/week   Barriers to discharge        Co-evaluation               AM-PAC PT "6 Clicks" Mobility  Outcome Measure Help needed turning from your back to your side while in a flat bed without using bedrails?: None Help needed moving from lying on your back to sitting on the side of a flat bed without using bedrails?: None Help needed moving to and from a bed to a chair (including a wheelchair)?: A Little Help  needed standing up from a chair using your arms (e.g., wheelchair or bedside chair)?: A Little Help needed to walk in hospital room?: A Little Help needed climbing 3-5 steps with a railing? : A Little 6 Click Score: 20    End of Session Equipment Utilized During Treatment: Gait belt Activity Tolerance: Patient tolerated treatment well Patient left: in bed;with call bell/phone within reach;with family/visitor present (on bed in ED) Nurse Communication: Mobility status PT Visit Diagnosis: Unsteadiness on feet (R26.81);Muscle weakness (generalized) (M62.81);History of falling (Z91.81);Repeated falls (R29.6)    Time: 0355-9741 PT Time Calculation (min) (ACUTE ONLY): 20 min   Charges:   PT Evaluation $PT Eval Low Complexity: 1 Low          Lou Miner, DPT  Acute Rehabilitation Services  Pager: [phone] Office: [phone]   Rudean Hitt 07/25/2021, 4:14 PM

## 2021-07-25 NOTE — Evaluation (Signed)
Occupational Therapy Evaluation Patient Details Name: Alexis [name] MRN: [id number] DOB: [birth year] Today's Date: 07/25/2021   History of Present Illness Pt is a 70 y.o. female who presented 07/24/21 with fatigue, weakness, hypoxia. CXR consistent with mild pulmonary edema. PMH: CHF, recent R foot fx, chronic back pain; DM; fibromyalgia; HTN; HLD; IBS; OSA; obesity;  and remote uterine cancer   Clinical Impression   Pt was ambulating with RW and CAM boot on R foot prior to admission. She was independent in self care. Pt presents with decreased safety awareness and impaired balance. Her Sp02 dropped to 85-87% on RA with ambulation, returned to 96% on 3L 02. Pt requires min guard to moderate assistance for ADL and min guard assist for ambulation with RW. Will follow acutely.      Recommendations for follow up therapy are one component of a multi-disciplinary discharge planning process, led by the attending physician.  Recommendations may be updated based on patient status, additional functional criteria and insurance authorization.   Follow Up Recommendations  No OT follow up    Assistance Recommended at Discharge Intermittent Supervision/Assistance  Functional Status Assessment  Patient has had a recent decline in their functional status and demonstrates the ability to make significant improvements in function in a reasonable and predictable amount of time.  Equipment Recommendations  None recommended by OT    Recommendations for Other Services       Precautions / Restrictions Precautions Precautions: Fall Precaution Comments: hx of multiple falls Restrictions Weight Bearing Restrictions: Yes RLE Weight Bearing: Weight bearing as tolerated Other Position/Activity Restrictions: WBAT in CAM boot      Mobility Bed Mobility Overal bed mobility: Modified Independent                  Transfers Overall transfer level: Needs assistance Equipment used: Rolling walker (2  wheels) Transfers: Sit to/from Stand Sit to Stand: Min guard                  Balance Overall balance assessment: Needs assistance   Sitting balance-Leahy Scale: Good       Standing balance-Leahy Scale: Poor Standing balance comment: fair static standing to manage clothing after toileting, poor dynamic requiring RW                           ADL either performed or assessed with clinical judgement   ADL Overall ADL's : Needs assistance/impaired Eating/Feeding: Independent;Sitting   Grooming: Wash/dry hands;Sitting;Set up Grooming Details (indicate cue type and reason): from w/c Upper Body Bathing: Set up;Sitting   Lower Body Bathing: Minimal assistance;Sit to/from stand   Upper Body Dressing : Set up;Sitting   Lower Body Dressing: Sitting/lateral leans;Moderate assistance Lower Body Dressing Details (indicate cue type and reason): CAM boot and shoe Toilet Transfer: Min guard;Stand-pivot;Comfort height toilet;Grab bars   Toileting- Clothing Manipulation and Hygiene: Min guard;Sit to/from stand       Functional mobility during ADLs: Min guard;Rolling walker (2 wheels)       Vision Baseline Vision/History: 1 Wears glasses Ability to See in Adequate Light: 0 Adequate Patient Visual Report: No change from baseline       Perception     Praxis      Pertinent Vitals/Pain Pain Assessment: Faces Faces Pain Scale: No hurt     Hand Dominance Right   Extremity/Trunk Assessment Upper Extremity Assessment Upper Extremity Assessment: Overall WFL for tasks assessed   Lower Extremity Assessment Lower Extremity Assessment:  Defer to PT evaluation   Cervical / Trunk Assessment Cervical / Trunk Assessment: Normal   Communication Communication Communication: No difficulties   Cognition Arousal/Alertness: Awake/alert Behavior During Therapy: WFL for tasks assessed/performed Overall Cognitive Status: Impaired/Different from baseline Area of Impairment:  Safety/judgement                         Safety/Judgement: Decreased awareness of deficits;Decreased awareness of safety     General Comments: educated pt to use RW with both handles, per daughter she was only opening one side at home     General Comments       Exercises     Shoulder [place] expects to be discharged to:: Private residence Living Arrangements: Spouse/significant other;Children (daughter) Available Help at Discharge: Family;Available 24 hours/day Type of Home: House Home Access: Stairs to enter [employer] of Steps: 6 Entrance Stairs-Rails: Right;Left;Can reach both Home Layout: One level     Bathroom Shower/Tub: Occupational psychologist: Handicapped height     Home Equipment: Conservation officer, nature (2 wheels);Cane - single point;Shower seat;Grab bars - toilet;Grab bars - tub/shower;Hand held shower head          Prior Functioning/Environment Prior Level of Function : Needs assist             Mobility Comments: walks with RW and CAM boot on R foot ADLs Comments: independent in self care        OT Problem List: Impaired balance (sitting and/or standing);Decreased knowledge of use of DME or AE;Decreased safety awareness      OT Treatment/Interventions: Self-care/ADL training;Energy conservation;DME and/or AE instruction;Patient/family education;Balance training;Therapeutic activities    OT Goals(Current goals can be found in the care plan section) Acute Rehab OT Goals OT Goal Formulation: With patient Time For Goal Achievement: 08/08/21 Potential to Achieve Goals: Good ADL Goals Pt Will Perform Grooming: with modified independence;standing Pt Will Perform Lower Body Bathing: with modified independence;sit to/from stand Pt Will Perform Lower Body Dressing: with modified independence;sit to/from stand Pt Will Transfer to Toilet: with modified independence;ambulating Pt Will Perform  Toileting - Clothing Manipulation and hygiene: with modified independence;sit to/from stand Additional ADL Goal #1: Pt will state at least 3 fall prevention strategies as instructed. Additional ADL Goal #2: Pt will generalize energy conservation strategies in ADL and mobility.  OT Frequency: Min 2X/week   Barriers to D/C:            Co-evaluation PT/OT/SLP Co-Evaluation/Treatment: Yes Reason for Co-Treatment: For patient/therapist safety   OT goals addressed during session: ADL's and self-care      AM-PAC OT "6 Clicks" Daily Activity     Outcome Measure Help from another person eating meals?: None Help from another person taking care of personal grooming?: A Little Help from another person toileting, which includes using toliet, bedpan, or urinal?: A Little Help from another person bathing (including washing, rinsing, drying)?: A Little Help from another person to put on and taking off regular upper body clothing?: None Help from another person to put on and taking off regular lower body clothing?: A Little 6 Click Score: 20   End of Session Equipment Utilized During Treatment: Rolling walker (2 wheels);Gait belt Nurse Communication: Mobility status  Activity Tolerance: Patient tolerated treatment well Patient left: in bed;with call bell/phone within reach;with family/visitor present  OT Visit Diagnosis: Unsteadiness on feet (R26.81);Other abnormalities of gait and mobility (R26.89)  Time: 1000-1025 OT Time Calculation (min): 25 min Charges:  OT General Charges $OT Visit: 1 Visit OT Evaluation $OT Eval Moderate Complexity: 1 Mod  Nestor Lewandowsky, OTR/L Acute Rehabilitation Services Pager: [phone] Office: [phone]   Malka So 07/25/2021, 11:59 AM

## 2021-07-25 NOTE — Progress Notes (Signed)
Mobility Specialist Progress Note:   07/25/21 1606  Mobility  Activity Ambulated in hall  Level of Assistance Contact guard assist, steadying assist  Assistive Device Other (Comment) (HHA)  Distance Ambulated (ft) 230 ft  Mobility Ambulated with assistance in hallway  Mobility Response Tolerated well  Mobility performed by Mobility specialist  $Mobility charge 1 Mobility   Pt received in bed willing to participate in mobility. No complaints of pain. Pt returned to EOB with call bell in reach and all needs met.   [facility] Public librarian Phone [phone] Secondary Phone [phone]

## 2021-07-25 NOTE — ED Notes (Signed)
Notified provider about pt Hgb trending down.

## 2021-07-25 NOTE — Progress Notes (Signed)
JOSILYNN LOSH is a 70 y.o. female with medical history significant of  CHF, A.fib DM2, Gastroparesis, GERD, HTN, HLD, OSA fatty liver disease, IBS, complaining of chest pain or sob. She was admitted for acute on chronic diastolic heart failure.  She was started in IV lasix with good diuresis. Her hemoglobin yesterday evening was around 9 and it has dropped to . 7.1 this am. She was given a unit of PRBC transfusion and repeat hemoglobin is around 10.  She is initially scheduled to get a colonoscopy in January as outpatient. But with her drop in hemoglobin overnight, Cardiology recommendations, will request Gi consult in am for further evaluation.  Meanwhile will order stool for occult blood. Anemia panel is unremarkable at this time.  She denies any melanotic stools.  Continue to check Hemoglobin every 6 hours and transfuse to keep hemoglobin greater than 7.   Hosie Poisson, MD

## 2021-07-25 NOTE — ED Notes (Signed)
Pt ambulated to restroom with assistance.  

## 2021-07-26 ENCOUNTER — Inpatient Hospital Stay (HOSPITAL_COMMUNITY): Payer: Medicare Other

## 2021-07-26 DIAGNOSIS — Z7901 Long term (current) use of anticoagulants: Secondary | ICD-10-CM

## 2021-07-26 DIAGNOSIS — R195 Other fecal abnormalities: Secondary | ICD-10-CM

## 2021-07-26 DIAGNOSIS — D649 Anemia, unspecified: Secondary | ICD-10-CM

## 2021-07-26 DIAGNOSIS — M7989 Other specified soft tissue disorders: Secondary | ICD-10-CM

## 2021-07-26 DIAGNOSIS — K529 Noninfective gastroenteritis and colitis, unspecified: Secondary | ICD-10-CM

## 2021-07-26 LAB — BASIC METABOLIC PANEL
Anion gap: 8 (ref 5–15)
BUN: 12 mg/dL (ref 8–23)
CO2: 29 mmol/L (ref 22–32)
Calcium: 8.2 mg/dL — ABNORMAL LOW (ref 8.9–10.3)
Chloride: 103 mmol/L (ref 98–111)
Creatinine, Ser: 0.88 mg/dL (ref 0.44–1.00)
GFR, Estimated: >60 mL/min (ref >60)
Glucose, Bld: 103 mg/dL — ABNORMAL HIGH (ref 70–99)
Potassium: 3.8 mmol/L (ref 3.5–5.1)
Sodium: 140 mmol/L (ref 135–145)

## 2021-07-26 LAB — GLUCOSE, CAPILLARY
Glucose-Capillary: 103 mg/dL — ABNORMAL HIGH (ref 70–99)
Glucose-Capillary: 118 mg/dL — ABNORMAL HIGH (ref 70–99)
Glucose-Capillary: 125 mg/dL — ABNORMAL HIGH (ref 70–99)
Glucose-Capillary: 144 mg/dL — ABNORMAL HIGH (ref 70–99)
Glucose-Capillary: 157 mg/dL — ABNORMAL HIGH (ref 70–99)
Glucose-Capillary: 161 mg/dL — ABNORMAL HIGH (ref 70–99)

## 2021-07-26 LAB — BPAM RBC
Blood Product Expiration Date: 202301072359
ISSUE DATE / TIME: 202212140523
Unit Type and Rh: 5100

## 2021-07-26 LAB — TYPE AND SCREEN
ABO/RH(D): O POS
Antibody Screen: NEGATIVE
Unit division: 0

## 2021-07-26 LAB — HEMOGLOBIN AND HEMATOCRIT, BLOOD
HCT: 32.4 % — ABNORMAL LOW (ref 36.0–46.0)
Hemoglobin: 10.4 g/dL — ABNORMAL LOW (ref 12.0–15.0)

## 2021-07-26 MED ORDER — BISACODYL 5 MG PO TBEC
10.0000 mg | DELAYED_RELEASE_TABLET | Freq: Once | ORAL | Status: AC
  Administered 2021-07-26: 10 mg via ORAL
  Filled 2021-07-26: qty 2

## 2021-07-26 MED ORDER — PEG-KCL-NACL-NASULF-NA ASC-C 100 G PO SOLR
0.5000 | Freq: Once | ORAL | Status: AC
  Administered 2021-07-26: 100 g via ORAL
  Filled 2021-07-26: qty 1

## 2021-07-26 MED ORDER — FUROSEMIDE 10 MG/ML IJ SOLN
60.0000 mg | Freq: Two times a day (BID) | INTRAMUSCULAR | Status: DC
  Administered 2021-07-26 (×2): 60 mg via INTRAVENOUS
  Filled 2021-07-26: qty 6

## 2021-07-26 MED ORDER — PEG-KCL-NACL-NASULF-NA ASC-C 100 G PO SOLR: 1.0000 | Freq: Once | ORAL | Status: DC

## 2021-07-26 MED ORDER — PEG-KCL-NACL-NASULF-NA ASC-C 100 G PO SOLR
0.5000 | Freq: Once | ORAL | Status: AC
  Administered 2021-07-27: 100 g via ORAL

## 2021-07-26 MED ORDER — METOCLOPRAMIDE HCL 5 MG/ML IJ SOLN
10.0000 mg | Freq: Four times a day (QID) | INTRAMUSCULAR | Status: AC
  Administered 2021-07-26 – 2021-07-27 (×2): 10 mg via INTRAVENOUS
  Filled 2021-07-26 (×2): qty 2

## 2021-07-26 NOTE — Consult Note (Signed)
[facility] Consult: 8:16 AM 07/26/2021  LOS: 2 days    Referring Provider: Dr Karleen Hampshire  Primary Care Physician:  Deland Pretty, MD Primary Gastroenterologist:  Dr. Loletha Carrow, previously Olevia Perches.       Reason for Consultation:  FOBT + Anemia.     HPI: Alexis [name] is a 70 y.o. female.  PMH IDDM.  Fibromyalgia.  Diabetic gastroparesis.  Idiopathic constipation.  GERD.  IBS.  Previous empiric treatment for diarrhea,? SBO with Flagyl.  S/p cholecystectomy.  Uterine cancer, 2000 hysterectomy.    Fatty liver per 2007 [place].  Diastolic heart failure.  OSA, prescribed CPAP but she takes it off in her sleep, recently got prescription for a different CPAP apparatus but has not filled this yet.  Obesity.  PAF, chronic Eliquis.  Repair of hip fracture w L TAH 12/05/2020.  Perioperative anemia with Hb 6.4, transfused 2 PRBCs in late April  2012 colonoscopy unremarkable.  Random biopsies negative for microscopic colitis.   2013 EGD: Nonspecific gastritis, biopsies negative for H. pylori. Hyperplastic gastric polyp.  Esophageal biopsies negative for Barrett's.  Duodenal biopsies negative for celiac disease FOBT + stool in 11/2020.    GI OV 07/04/2019 for evaluation of bloating, diarrhea with loose to watery but not bloody or melenic stool.  Symptoms are particularly prominent postprandial along with some postprandial abdominal pain.  Patient has not tried taking any antidiarrheals.  She had run out of her prescription for Aciphex in the previous 90 days, this was renewed with a generic form of Aciphex about 2 weeks ago.  Denies significant pyrosis and dysphagia.  Dr. Loletha Carrow set her up for colonoscopy in early January.  Presented to the ED 2 days ago with fatigue, weakness over the last 6 months but recently worsening.  Chest pain,  orthopnea.  Self measured oxygen saturations in the 60s.  Also reports falling in recent weeks due to leg giving out.  Current diagnosis is acute on chronic diastolic heart failure.  Hypokalemia has been corrected.  Hgb 7.1 >> 1 PRBC >> 10.   9.5 in mid October, 3 months ago.  MCV 82, was 78 (low) 3 months ago.  Iron, TIBC, ferritin, b12, folate normal/above normal, low iron sats w no recent comps.   No coags.   Albumin low (present in April 2022), LFTs normal Renal fx normal.   A1c 8.    BNP 868, minor Trop elevation at 20.  TSH normal.      CXR with congestive heart failure, cardiomegaly, aortic atherosclerosis, pulmonary venous hypertension, small pleural effusions, atelectasis.  Last dose of Eliquis 12/14.     Maternal side of the family uncle and aunts have had cancers of the breast, stomach, colon.  Father died with lung cancer. No alcohol.  Lives with her husband, daughter and grandson.  She helps take care of her brother.    Past Medical History:  Diagnosis Date   Anxiety    Arthritis    "knees" (05/25/2014)   Chronic back pain    Depression    Diabetes mellitus type II  Diabetic peripheral neuropathy ([place]) 10/20/2018   Diverticulitis    Fatty liver    Fibromyalgia    Gastric polyp    hyperplastic   Gastroparesis    "recently dx'd" (05/25/2014)   GERD (gastroesophageal reflux disease)    H/O hiatal hernia    Hx of gastritis    Hyperlipidemia    Hypertension    Irritable bowel syndrome (IBS)    Memory difficulties 09/18/2017   Migraine without aura, without mention of intractable migraine without mention of status migrainosus    "related to allergies; have them in the spring and fall" (05/25/2014)   Obesity    Sleep apnea    "wore mask; took it off in my sleep; quit wearing it" (05/25/2014)   Uterine cancer ([place]) dx'd 2000   surg only   Vasculitis ([place])    "irritates my legs"   Wears glasses     Past Surgical History:  Procedure Laterality Date   ANTERIOR  CERVICAL DECOMPRESSION/DISCECTOMY FUSION 4 LEVELS N/A 05/07/2018   Procedure: ANTERIOR CERVICAL DECOMPRESSION FUSION, CERVICAL 4-5,CERVICAL 5-6, CERVICAL 6-7 WITH INSTRUMENTATION AND ALLOGRAFT;  Surgeon: Phylliss Bob, MD;  Location: [facility];  Service: Orthopedics;  Laterality: N/A;   APPENDECTOMY  ~ [place]   LEFT HEART CATHETERIZATION WITH CORONARY ANGIOGRAM N/A 05/26/2014   Procedure: LEFT HEART CATHETERIZATION WITH CORONARY ANGIOGRAM;  Surgeon: Troy Sine, MD;  Location: [facility] CATH LAB;  Service: Cardiovascular;  Laterality: N/A;   SINUS SURGERY WITH INSTATRAK  2000   TOTAL HIP ARTHROPLASTY Left 12/05/2020   Procedure: TOTAL HIP ARTHROPLASTY ANTERIOR APPROACH;  Surgeon: Rod Can, MD;  Location: [facility];  Service: Orthopedics;  Laterality: Left;   TUBAL LIGATION  ~ [place]  2000    Prior to Admission medications   Medication Sig Start Date End Date Taking? Authorizing Provider  acetaminophen (TYLENOL) 325 MG tablet Take 2 tablets (650 mg total) by mouth every 6 (six) hours as needed for mild pain or moderate pain. 12/12/20  Yes Nolberto Hanlon, MD  ALPRAZolam Duanne Moron) 0.5 MG tablet Take 0.5 mg by mouth at bedtime.   Yes [provider]  amiodarone (PACERONE) 200 MG tablet Take 1 tablet (200 mg total) by mouth daily. Patient taking differently: Take 200 mg by mouth 2 (two) times daily. 05/14/21  Yes Hilty, Nadean Corwin, MD  amLODipine (NORVASC) 5 MG tablet Take 5 mg by mouth every evening.   Yes [provider]  apixaban (ELIQUIS) 5 MG TABS tablet Take 1 tablet (5 mg total) by mouth 2 (two) times daily. 12/12/20  Yes Nolberto Hanlon, MD  b complex vitamins tablet Take 1 tablet by mouth at bedtime.   Yes [provider]  FARXIGA 10 MG TABS tablet Take 10 mg by mouth daily. 09/10/19  Yes [provider]  fish oil-omega-3 fatty acids 1000 MG capsule Take 1 g by mouth  at bedtime.    Yes [provider]  furosemide (LASIX) 20 MG tablet Take 1 tablet (20 mg total) by mouth daily. 05/14/21 08/12/21 Yes Hilty, Nadean Corwin, MD  insulin lispro (HUMALOG) 100 UNIT/ML KwikPen Inject 8 Units into the skin 3 (three) times daily as needed (high blood sugar). Per sliding scale   Yes [provider]  metFORMIN (GLUCOPHAGE-XR) 500 MG 24 hr tablet Take 500 mg by mouth daily. 08/24/19  Yes [provider]  metoprolol succinate (TOPROL-XL) 50 MG  24 hr tablet Take 2 tablets (100 mg total) by mouth in the morning and at bedtime. Take with or immediately following a meal. 01/29/21  Yes Almyra Deforest, PA  pregabalin (LYRICA) 50 MG capsule Take 50 mg by mouth 2 (two) times daily.   Yes [provider]  RABEprazole (ACIPHEX) 20 MG tablet Take 1 tablet (20 mg total) by mouth 2 (two) times daily. 04/22/12  Yes Lafayette Dragon, MD  tamsulosin (FLOMAX) 0.4 MG CAPS capsule Take 0.4 mg by mouth at bedtime. 07/17/16  Yes [provider]  TRESIBA FLEXTOUCH 100 UNIT/ML FlexTouch Pen Inject 20 Units into the skin at bedtime. 09/29/19  Yes [provider]  TRULICITY 1.5 IW/5.8KD SOPN Inject 1.5 mg into the skin once a week. 05/07/21  Yes [provider]  valsartan (DIOVAN) 320 MG tablet Take 320 mg by mouth daily.   Yes [provider]  venlafaxine XR (EFFEXOR-XR) 150 MG 24 hr capsule Take 150 mg by mouth 2 (two) times daily.   Yes [provider]  zaleplon (SONATA) 5 MG capsule Take 5 mg by mouth at bedtime.   Yes [provider]    Scheduled Meds:  sodium chloride   Intravenous Once   amiodarone  200 mg Oral Daily   atorvastatin  20 mg Oral Daily   empagliflozin  10 mg Oral Daily   furosemide  60 mg Intravenous BID   insulin aspart  0-9 Units Subcutaneous Q4H   insulin glargine-yfgn  20 Units Subcutaneous QHS   irbesartan  300 mg Oral Daily   metoprolol tartrate  25 mg Oral BID   potassium chloride  40 mEq Oral TID    pregabalin  50 mg Oral BID   sodium chloride flush  3 mL Intravenous Q12H   spironolactone  25 mg Oral Daily   tamsulosin  0.4 mg Oral QHS   venlafaxine XR  150 mg Oral BID   Infusions:  sodium chloride     PRN Meds: sodium chloride, acetaminophen **OR** acetaminophen, ALPRAZolam, sodium chloride flush   Allergies as of 07/24/2021 - Review Complete 07/24/2021  Allergen Reaction Noted   Aspirin Other (See Comments) 02/09/2014   Codeine Itching and Rash 08/24/2010   Naproxen Nausea Only 02/09/2014   Sulfonamide derivatives Itching and Rash 08/24/2010    Family History  Problem Relation Age of Onset   Breast cancer Sister    Colon polyps Father    Heart disease Father    Colon cancer Maternal Uncle    Ovarian cancer Maternal Aunt    Stomach cancer Maternal Aunt    Diabetes Maternal Aunt    Heart disease Maternal Uncle    Heart disease Other        Grandparents   Irritable bowel syndrome Daughter     Social History   Socioeconomic History   Marital status: Married    Spouse name: Jadence Kinlaw   Number of children: 2   Years of education: Not on file   Highest education level: Some college, no degree  Occupational History   Occupation: Retired    Fish farm manager: RETIRED  Tobacco Use   Smoking status: Never   Smokeless tobacco: Never  Vaping Use   Vaping Use: Never used  Substance and Sexual Activity   Alcohol use: No   Drug use: No   Sexual activity: Not Currently  Other Topics Concern   Not on file  Social History Narrative   Daily Caffeine, Coke   Social Determinants of Health  Financial Resource Strain: Low Risk    Difficulty of Paying Living Expenses: Not very hard  Food Insecurity: No Food Insecurity   Worried About Charity fundraiser in the Last Year: Never true   Ran Out of Food in the Last Year: Never true  Transportation Needs: No Transportation Needs   Lack of Transportation (Medical): No   Lack of Transportation (Non-Medical): No  Physical  Activity: Not on file  Stress: Not on file  Social Connections: Not on file  Intimate Partner Violence: Not on file    REVIEW OF SYSTEMS: Constitutional: Weakness and fatigue somewhat better ENT:  No nose bleeds Pulm: Dyspnea improved but not resolved CV:  No palpitations, no LE edema.  No angina during admission. GU:  No hematuria, no frequency GI: See HPI. Heme: Denies significant purpura or bruising.  No overt bleeding. Transfusions: See HPI. Neuro:  No headaches, no peripheral tingling or numbness Derm:  No itching, no rash or sores.  Endocrine:  No sweats or chills.  No polyuria or dysuria Immunization: Reviewed. Travel:  None beyond local counties in last few months.    PHYSICAL EXAM: Vital signs in last 24 hours: Vitals:   07/26/21 0407 07/26/21 0727  BP: (!) 144/66 (!) 144/55  Pulse: 72 73  Resp: 18 18  Temp: 98 F (36.7 C) 98.1 F (36.7 C)  SpO2: 99% 97%   Wt Readings from Last 3 Encounters:  07/26/21 83.8 kg  07/03/21 83 kg  05/14/21 79.8 kg    General: Patient is overweight, does not look acutely ill, actually appears pretty well.  Talkative. Head:  No facial asymmetry or swelling.  No signs of head trauma.  Eyes: Conjunctiva pink.  No scleral icterus.  EOMI. Ears: Not hard of hearing Nose: No congestion or discharge Mouth: Good dentition.  Tongue midline.  Mucosa moist, pink, clear. Neck: No JVD, no masses, no thyromegaly Lungs: Clear bilaterally.  No labored breathing, no cough. Heart: RRR.  No MRG.  S1, S2 present Abdomen: Soft, not tender, not distended.  No HSM, masses.   Rectal: Deferred Musc/Skeltl: No gross joint deformities, swelling or redness Extremities: Minor, nonpitting pedal edema. Neurologic: Fully alert and oriented.  Detailed historian.  Moves all 4 limbs without tremor, strength not formally tested but no gross deficits observed. Skin: No significant purpura, bruising, rash or suspicious lesions Nodes: No cervical adenopathy Psych:  A bit anxious but pleasant, cooperative  Intake/Output from previous day: 12/14 0701 - 12/15 0700 In: 1276.4 [P.O.:720; Blood:300; IV Piggyback:256.4] Out: 4300 [Urine:4300] Intake/Output this shift: No intake/output data recorded.  LAB RESULTS: Recent Labs    07/25/21 1100 07/25/21 1650 07/25/21 2259  WBC 7.5 7.6 7.9  HGB 10.1* 10.2* 10.0*  HCT 31.8* 31.7* 31.8*  PLT 237 255 255   BMET Lab Results  Component Value Date   NA 140 07/26/2021   NA 137 07/25/2021   NA 137 07/25/2021   K 3.8 07/26/2021   K 3.3 (L) 07/25/2021   K 3.2 (L) 07/25/2021   CL 103 07/26/2021   CL 102 07/25/2021   CL 103 07/25/2021   CO2 29 07/26/2021   CO2 26 07/25/2021   CO2 26 07/25/2021   GLUCOSE 103 (H) 07/26/2021   GLUCOSE 161 (H) 07/25/2021   GLUCOSE 135 (H) 07/25/2021   BUN 12 07/26/2021   BUN 14 07/25/2021   BUN 13 07/25/2021   CREATININE 0.88 07/26/2021   CREATININE 0.80 07/25/2021   CREATININE 0.93 07/25/2021   CALCIUM 8.2 (L)  07/26/2021   CALCIUM 8.3 (L) 07/25/2021   CALCIUM 8.0 (L) 07/25/2021   LFT Recent Labs    07/24/21 1931 07/25/21 1100  PROT 4.5* 6.3*  ALBUMIN 1.8* 2.7*  AST 23 34  ALT 28 41  ALKPHOS 62 88  BILITOT 0.8 1.2  BILIDIR 0.2  --   IBILI 0.6  --    PT/INR Lab Results  Component Value Date   INR 0.97 05/07/2018   INR 1.04 05/26/2014   Hepatitis Panel No results for input(s): HEPBSAG, HCVAB, HEPAIGM, HEPBIGM in the last 72 hours. C-Diff No components found for: CDIFF Lipase     Component Value Date/Time   LIPASE 36 02/09/2014 1339    Drugs of Abuse  No results found for: LABOPIA, COCAINSCRNUR, LABBENZ, AMPHETMU, THCU, LABBARB   RADIOLOGY STUDIES: DG Chest 2 View  Result Date: 07/24/2021 CLINICAL DATA:  Shortness of breath and hypoxia EXAM: CHEST - 2 VIEW COMPARISON:  04/26/2021 FINDINGS: Cardiomegaly. Aortic atherosclerosis. Pulmonary venous hypertension. Bilateral pleural effusions with dependent pulmonary atelectasis. Findings  consistent with congestive heart failure. Upper lungs are otherwise clear. IMPRESSION: Congestive heart failure. Cardiomegaly, aortic atherosclerosis, pulmonary venous hypertension, small pleural effusions and dependent atelectasis. Electronically Signed   By: Nelson Chimes M.D.   On: 07/24/2021 14:39   ECHOCARDIOGRAM COMPLETE  Result Date: 07/25/2021    ECHOCARDIOGRAM REPORT   Patient Name:   Alexis [name] Date of Exam: 07/25/2021 Medical Rec #:  [id number]         Height:       65.0 in Accession #:    [id number]        Weight:       180.0 lb Date of Birth:  04-14-1951         BSA:          1.892 m Patient Age:    52 years          BP:           111/91 mmHg Patient Gender: F                 HR:           63 bpm. Exam Location:  Inpatient Procedure: Cardiac Doppler, Color Doppler and Intracardiac Opacification Agent Indications:    congestive heart failure  History:        Patient has prior history of Echocardiogram examinations, most                 recent 05/02/2021. CHF, Previous Myocardial Infarction,                 Signs/Symptoms:Shortness of Breath and Syncope; Risk                 Factors:Diabetes, Hypertension and Dyslipidemia.  Sonographer:    Melissa Morford RDCS (AE, PE) Referring Phys: [place]  1. Left ventricular ejection fraction, by estimation, is 65 to 70%. The left ventricle has normal function. The left ventricle has no regional wall motion abnormalities. Left ventricular diastolic parameters are consistent with Grade II diastolic dysfunction (pseudonormalization).  2. Right ventricular systolic function is normal. The right ventricular size is normal.  3. The mitral valve is degenerative. No evidence of mitral valve regurgitation. No evidence of mitral stenosis. Moderate mitral annular calcification.  4. The aortic valve is tricuspid. Aortic valve regurgitation is not visualized. Aortic valve sclerosis is present, with no evidence of aortic valve stenosis.  5.  The inferior vena cava is normal  in size with <50% respiratory variability, suggesting right atrial pressure of 8 mmHg. Comparison(s): No significant change from prior study. FINDINGS  Left Ventricle: Left ventricular ejection fraction, by estimation, is 65 to 70%. The left ventricle has normal function. The left ventricle has no regional wall motion abnormalities. Definity contrast agent was given IV to delineate the left ventricular  endocardial borders. The left ventricular internal cavity size was normal in size. There is no left ventricular hypertrophy. Left ventricular diastolic parameters are consistent with Grade II diastolic dysfunction (pseudonormalization). Right Ventricle: The right ventricular size is normal. No increase in right ventricular wall thickness. Right ventricular systolic function is normal. The tricuspid regurgitant velocity is 2.81 m/s, and with an assumed right atrial pressure of 8 mmHg, the estimated right ventricular systolic pressure is 67.6 mmHg. Left Atrium: Left atrial size was normal in size. Right Atrium: Right atrial size was normal in size. Pericardium: There is no evidence of pericardial effusion. Mitral Valve: The mitral valve is degenerative in appearance. Moderate mitral annular calcification. No evidence of mitral valve regurgitation. No evidence of mitral valve stenosis. Tricuspid Valve: The tricuspid valve is normal in structure. Tricuspid valve regurgitation is trivial. No evidence of tricuspid stenosis. Aortic Valve: The aortic valve is tricuspid. Aortic valve regurgitation is not visualized. Aortic valve sclerosis is present, with no evidence of aortic valve stenosis. Pulmonic Valve: The pulmonic valve was not well visualized. Pulmonic valve regurgitation is not visualized. No evidence of pulmonic stenosis. Aorta: The aortic root is normal in size and structure. Venous: The inferior vena cava is normal in size with less than 50% respiratory variability, suggesting  right atrial pressure of 8 mmHg. IAS/Shunts: The atrial septum is grossly normal.  LEFT VENTRICLE PLAX 2D LVIDd:         4.60 cm   Diastology LVIDs:         2.80 cm   LV e' medial:    4.68 cm/s LV PW:         1.00 cm   LV E/e' medial:  24.4 LV IVS:        1.00 cm   LV e' lateral:   4.79 cm/s LVOT diam:     1.90 cm   LV E/e' lateral: 23.8 LV SV:         80 LV SV Index:   42 LVOT Area:     2.84 cm  RIGHT VENTRICLE RV S prime:     14.50 cm/s TAPSE (M-mode): 2.2 cm LEFT ATRIUM             Index        RIGHT ATRIUM           Index LA diam:        4.50 cm 2.38 cm/m   RA Area:     16.90 cm LA Vol (A2C):   51.0 ml 26.96 ml/m  RA Volume:   48.70 ml  25.75 ml/m LA Vol (A4C):   45.4 ml 24.00 ml/m LA Biplane Vol: 48.1 ml 25.43 ml/m  AORTIC VALVE LVOT Vmax:   125.00 cm/s LVOT Vmean:  87.700 cm/s LVOT VTI:    0.283 m  AORTA Ao Root diam: 2.80 cm MITRAL VALVE                TRICUSPID VALVE MV Area (PHT): 5.54 cm     TR Peak grad:   31.6 mmHg MV Decel Time: 137 msec     TR Vmax:  281.00 cm/s MV E velocity: 114.00 cm/s MV A velocity: 107.00 cm/s  SHUNTS MV E/A ratio:  1.07         Systemic VTI:  0.28 m                             Systemic Diam: 1.90 cm Rudean Haskell MD Electronically signed by Rudean Haskell MD Signature Date/Time: 07/25/2021/3:06:53 PM    Final       IMPRESSION:     FOBT + anemia.  Per recent labs not B12, folate or iron deficient.    Acute on chronic diastolic heart failure  OSA.  Previously noncompliant with CPAP but recently reinitiated on CPAP.     Hypoalbuminemia.     Diarrhea.  May be a combination of her IBS as well as meds (Glucophage).  Has had this problem in past years with unrevealing colonoscopy and biopsies.     IDDM.  A1c 8.    PAF.  Chronic Eliquis, last dose 12/14 at 02 100.Marland Kitchen      PLAN:     EGD and colonoscopy for tomorrow, see orders.   Azucena Freed  07/26/2021, 8:16 AM Phone [phone]

## 2021-07-26 NOTE — Progress Notes (Signed)
Cardiology Progress Note  Patient ID: Alexis [name] MRN: [id number] DOB: [birth year] Date of Encounter: 07/26/2021  Primary Cardiologist: Pixie Casino, MD  Subjective   Chief Complaint: Shortness of breath  HPI: Good diuresis.  Potassium stable.  Still short of breath and not back to baseline.  ROS:  All other ROS reviewed and negative. Pertinent positives noted in the HPI.     Inpatient Medications  Scheduled Meds:  sodium chloride   Intravenous Once   amiodarone  200 mg Oral Daily   atorvastatin  20 mg Oral Daily   bisacodyl  10 mg Oral Once   empagliflozin  10 mg Oral Daily   furosemide  60 mg Intravenous BID   insulin aspart  0-9 Units Subcutaneous Q4H   insulin glargine-yfgn  20 Units Subcutaneous QHS   irbesartan  300 mg Oral Daily   metoCLOPramide (REGLAN) injection  10 mg Intravenous Q6H   metoprolol tartrate  25 mg Oral BID   peg 3350 powder  0.5 kit Oral Once   And   peg 3350 powder  0.5 kit Oral Once   potassium chloride  40 mEq Oral TID   pregabalin  50 mg Oral BID   sodium chloride flush  3 mL Intravenous Q12H   spironolactone  25 mg Oral Daily   tamsulosin  0.4 mg Oral QHS   venlafaxine XR  150 mg Oral BID   Continuous Infusions:  sodium chloride     PRN Meds: sodium chloride, acetaminophen **OR** acetaminophen, ALPRAZolam, sodium chloride flush   Vital Signs   Vitals:   07/25/21 2012 07/26/21 0011 07/26/21 0407 07/26/21 0727  BP: (!) 138/51 (!) 142/59 (!) 144/66 (!) 144/55  Pulse: 62 67 72 73  Resp: _0 Temp: 98.3 F (36.8 C) 98.1 F (36.7 C) 98 F (36.7 C) 98.1 F (36.7 C)  TempSrc: Oral Oral Oral Oral  SpO2: 98% 97% 99% 97%  Weight:   83.8 kg   Height:        Intake/Output Summary (Last 24 hours) at 07/26/2021 0944 Last data filed at 07/26/2021 0837 Gross per 24 hour  Intake 1196.37 ml  Output 4300 ml  Net -3103.63 ml   Last 3 Weights 07/26/2021 07/25/2021 07/24/2021  Weight (lbs) 184 lb 12.8 oz 191 lb 9.3 oz 180  lb  Weight (kg) 83.825 kg 86.9 kg 81.647 kg      Telemetry  Overnight telemetry shows sinus rhythm in the 60s, which I personally reviewed.   Physical Exam   Vitals:   07/25/21 2012 07/26/21 0011 07/26/21 0407 07/26/21 0727  BP: (!) 138/51 (!) 142/59 (!) 144/66 (!) 144/55  Pulse: 62 67 72 73  Resp: _1 Temp: 98.3 F (36.8 C) 98.1 F (36.7 C) 98 F (36.7 C) 98.1 F (36.7 C)  TempSrc: Oral Oral Oral Oral  SpO2: 98% 97% 99% 97%  Weight:   83.8 kg   Height:        Intake/Output Summary (Last 24 hours) at 07/26/2021 0944 Last data filed at 07/26/2021 0837 Gross per 24 hour  Intake 1196.37 ml  Output 4300 ml  Net -3103.63 ml    Last 3 Weights 07/26/2021 07/25/2021 07/24/2021  Weight (lbs) 184 lb 12.8 oz 191 lb 9.3 oz 180 lb  Weight (kg) 83.825 kg 86.9 kg 81.647 kg    Body mass index is 30.75 kg/m.  General: Well nourished, well developed, in no acute distress Head: Atraumatic, normal size  Eyes:  PEERLA, EOMI  Neck: Supple, JVD 10 to 12 cm of water Endocrine: No thryomegaly Cardiac: Normal S1, S2; RRR; no murmurs, rubs, or gallops Lungs: Crackles of the lung bases Abd: Soft, nontender, no hepatomegaly  Ext: 2+ pitting edema Musculoskeletal: No deformities, BUE and BLE strength normal and equal Skin: Warm and dry, no rashes   Neuro: Alert and oriented to person, place, time, and situation, CNII-XII grossly intact, no focal deficits  Psych: Normal mood and affect   Labs  High Sensitivity Troponin:   Recent Labs  Lab 07/24/21 1638 07/24/21 1931  TROPONINIHS 29* 20*     Cardiac EnzymesNo results for input(s): TROPONINI in the last 168 hours. No results for input(s): TROPIPOC in the last 168 hours.  Chemistry Recent Labs  Lab 07/24/21 1931 07/24/21 2210 07/25/21 1100 07/25/21 1650 07/26/21 0250  NA  --    < > 137 137 140  K  --    < > 3.2* 3.3* 3.8  CL  --    < > 103 102 103  CO2  --    < > _0 GLUCOSE  --    < > 135* 161* 103*  BUN  --    <  > _1 CREATININE  --    < > 0.93 0.80 0.88  CALCIUM  --    < > 8.0* 8.3* 8.2*  PROT 4.5*  --  6.3*  --   --   ALBUMIN 1.8*  --  2.7*  --   --   AST 23  --  34  --   --   ALT 28  --  41  --   --   ALKPHOS 62  --  88  --   --   BILITOT 0.8  --  1.2  --   --   GFRNONAA  --    < > >60 >60 >60  ANIONGAP  --    < > _2 < > = values in this interval not displayed.    Hematology Recent Labs  Lab 07/25/21 1100 07/25/21 1650 07/25/21 2259  WBC 7.5 7.6 7.9  RBC 3.85*   3.85* 3.89 3.86*  HGB 10.1* 10.2* 10.0*  HCT 31.8* 31.7* 31.8*  MCV 82.6 81.5 82.4  MCH 26.2 26.2 25.9*  MCHC 31.8 32.2 31.4  RDW 17.0* 17.2* 17.1*  PLT 237 255 255   BNP Recent Labs  Lab 07/24/21 1355  BNP 868.1*    DDimer No results for input(s): DDIMER in the last 168 hours.   Radiology  DG Chest 2 View  Result Date: 07/24/2021 CLINICAL DATA:  Shortness of breath and hypoxia EXAM: CHEST - 2 VIEW COMPARISON:  04/26/2021 FINDINGS: Cardiomegaly. Aortic atherosclerosis. Pulmonary venous hypertension. Bilateral pleural effusions with dependent pulmonary atelectasis. Findings consistent with congestive heart failure. Upper lungs are otherwise clear. IMPRESSION: Congestive heart failure. Cardiomegaly, aortic atherosclerosis, pulmonary venous hypertension, small pleural effusions and dependent atelectasis. Electronically Signed   By: Nelson Chimes M.D.   On: 07/24/2021 14:39   ECHOCARDIOGRAM COMPLETE  Result Date: 07/25/2021    ECHOCARDIOGRAM REPORT   Patient Name:   Alexis [name] Date of Exam: 07/25/2021 Medical Rec #:  [id number]         Height:       65.0 in Accession #:    [id number]        Weight:       180.0 lb Date of Birth:  March 23, 1951  BSA:          1.892 m Patient Age:    70 years          BP:           111/91 mmHg Patient Gender: F                 HR:           63 bpm. Exam Location:  Inpatient Procedure: Cardiac Doppler, Color Doppler and Intracardiac Opacification Agent Indications:     congestive heart failure  History:        Patient has prior history of Echocardiogram examinations, most                 recent 05/02/2021. CHF, Previous Myocardial Infarction,                 Signs/Symptoms:Shortness of Breath and Syncope; Risk                 Factors:Diabetes, Hypertension and Dyslipidemia.  Sonographer:    Melissa Morford RDCS (AE, PE) Referring Phys: [place]  1. Left ventricular ejection fraction, by estimation, is 65 to 70%. The left ventricle has normal function. The left ventricle has no regional wall motion abnormalities. Left ventricular diastolic parameters are consistent with Grade II diastolic dysfunction (pseudonormalization).  2. Right ventricular systolic function is normal. The right ventricular size is normal.  3. The mitral valve is degenerative. No evidence of mitral valve regurgitation. No evidence of mitral stenosis. Moderate mitral annular calcification.  4. The aortic valve is tricuspid. Aortic valve regurgitation is not visualized. Aortic valve sclerosis is present, with no evidence of aortic valve stenosis.  5. The inferior vena cava is normal in size with <50% respiratory variability, suggesting right atrial pressure of 8 mmHg. Comparison(s): No significant change from prior study. FINDINGS  Left Ventricle: Left ventricular ejection fraction, by estimation, is 65 to 70%. The left ventricle has normal function. The left ventricle has no regional wall motion abnormalities. Definity contrast agent was given IV to delineate the left ventricular  endocardial borders. The left ventricular internal cavity size was normal in size. There is no left ventricular hypertrophy. Left ventricular diastolic parameters are consistent with Grade II diastolic dysfunction (pseudonormalization). Right Ventricle: The right ventricular size is normal. No increase in right ventricular wall thickness. Right ventricular systolic function is normal. The tricuspid regurgitant  velocity is 2.81 m/s, and with an assumed right atrial pressure of 8 mmHg, the estimated right ventricular systolic pressure is 00.9 mmHg. Left Atrium: Left atrial size was normal in size. Right Atrium: Right atrial size was normal in size. Pericardium: There is no evidence of pericardial effusion. Mitral Valve: The mitral valve is degenerative in appearance. Moderate mitral annular calcification. No evidence of mitral valve regurgitation. No evidence of mitral valve stenosis. Tricuspid Valve: The tricuspid valve is normal in structure. Tricuspid valve regurgitation is trivial. No evidence of tricuspid stenosis. Aortic Valve: The aortic valve is tricuspid. Aortic valve regurgitation is not visualized. Aortic valve sclerosis is present, with no evidence of aortic valve stenosis. Pulmonic Valve: The pulmonic valve was not well visualized. Pulmonic valve regurgitation is not visualized. No evidence of pulmonic stenosis. Aorta: The aortic root is normal in size and structure. Venous: The inferior vena cava is normal in size with less than 50% respiratory variability, suggesting right atrial pressure of 8 mmHg. IAS/Shunts: The atrial septum is grossly normal.  LEFT VENTRICLE PLAX 2D LVIDd:  4.60 cm   Diastology LVIDs:         2.80 cm   LV e' medial:    4.68 cm/s LV PW:         1.00 cm   LV E/e' medial:  24.4 LV IVS:        1.00 cm   LV e' lateral:   4.79 cm/s LVOT diam:     1.90 cm   LV E/e' lateral: 23.8 LV SV:         80 LV SV Index:   42 LVOT Area:     2.84 cm  RIGHT VENTRICLE RV S prime:     14.50 cm/s TAPSE (M-mode): 2.2 cm LEFT ATRIUM             Index        RIGHT ATRIUM           Index LA diam:        4.50 cm 2.38 cm/m   RA Area:     16.90 cm LA Vol (A2C):   51.0 ml 26.96 ml/m  RA Volume:   48.70 ml  25.75 ml/m LA Vol (A4C):   45.4 ml 24.00 ml/m LA Biplane Vol: 48.1 ml 25.43 ml/m  AORTIC VALVE LVOT Vmax:   125.00 cm/s LVOT Vmean:  87.700 cm/s LVOT VTI:    0.283 m  AORTA Ao Root diam: 2.80 cm MITRAL  VALVE                TRICUSPID VALVE MV Area (PHT): 5.54 cm     TR Peak grad:   31.6 mmHg MV Decel Time: 137 msec     TR Vmax:        281.00 cm/s MV E velocity: 114.00 cm/s MV A velocity: 107.00 cm/s  SHUNTS MV E/A ratio:  1.07         Systemic VTI:  0.28 m                             Systemic Diam: 1.90 cm Rudean Haskell MD Electronically signed by Rudean Haskell MD Signature Date/Time: 07/25/2021/3:06:53 PM    Final     Cardiac Studies  TTE 07/25/2021  1. Left ventricular ejection fraction, by estimation, is 65 to 70%. The  left ventricle has normal function. The left ventricle has no regional  wall motion abnormalities. Left ventricular diastolic parameters are  consistent with Grade II diastolic  dysfunction (pseudonormalization).   2. Right ventricular systolic function is normal. The right ventricular  size is normal.   3. The mitral valve is degenerative. No evidence of mitral valve  regurgitation. No evidence of mitral stenosis. Moderate mitral annular  calcification.   4. The aortic valve is tricuspid. Aortic valve regurgitation is not  visualized. Aortic valve sclerosis is present, with no evidence of aortic  valve stenosis.   5. The inferior vena cava is normal in size with <50% respiratory  variability, suggesting right atrial pressure of 8 mmHg.   Patient Profile  Alexis [name] is a 70 y.o. female with diastolic heart failure, nonobstructive CAD, paroxysmal atrial fibrillation on amiodarone, diabetes, hypertension, hyperlipidemia who was admitted on 07/24/2021 for acute hypoxic respiratory failure secondary to acute on chronic diastolic heart failure.  Assessment & Plan   #Acute on chronic diastolic heart failure -EF preserved.  BNP elevated.  Chest x-ray with pulmonary edema. -Was not adhering to a low-salt diet.  Suspect this was her  contributor. -Still volume overloaded.  Potassium is stable. -Continue with Lasix 60 mg IV twice daily. -He she is on  potassium 40 mEq 3 times daily. -Continue Aldactone 25 mg daily. -Jardiance 10 mg daily added. -Albumin also low.  GI with plans for biopsies as well.  I believe this is beneficial.  Her low albumin could be related to diarrhea.  I suspect her low potassium is also related to diarrhea. -For now she is stable for further diuresis.  Potassium is at a safe level. -Anticipate 1-2 more days of IV diuresis.  #Hypokalemia #Hypoalbuminemia #Diarrhea -Secondary to diarrhea.  GI for evaluation with biopsies.  #Paroxysmal atrial fibrillation -Maintaining sinus rhythm on amiodarone. -Now admitted with acute loss anemia.  Holding anticoagulation until fully evaluated.  #Acute blood loss anemia -New drop in hemoglobin.  Stool was guaiac positive. -GI will evaluate her to determine what it is safe to go back on anticoagulation.  For questions or updates, please contact [place] Please consult [email or web address] for contact info under   Time Spent with Patient: I have spent a total of 35 minutes with patient reviewing hospital notes, telemetry, EKGs, labs and examining the patient as well as establishing an assessment and plan that was discussed with the patient.  > 50% of time was spent in direct patient care.    Signed, Addison Naegeli. Audie Box, MD, [place]  07/26/2021 9:44 AM

## 2021-07-26 NOTE — Progress Notes (Signed)
Mobility Specialist Progress Note:   07/26/21 1704  Mobility  Activity Ambulated in hall  Level of Assistance Contact guard assist, steadying assist  Assistive Device  (HHA)  Distance Ambulated (ft) 500 ft  Mobility Ambulated with assistance in hallway  Mobility Response Tolerated well  Mobility performed by Mobility specialist  $Mobility charge 1 Mobility   Second walk of the [place]ak[place] Frix. No complaints of pain.   [facility] Public librarian Phone [phone] Secondary Phone [phone]

## 2021-07-26 NOTE — Progress Notes (Signed)
Pt.non compliant with fall precautions. Reinforced need to call with ambulation and bathroom assistance.non skid socks and bed alarm in place.

## 2021-07-26 NOTE — Progress Notes (Signed)
Pt has not been wearing her CPAP at home. Pt stated that she received a new prescription from her doctor for a new CPAP machine. Pt also stated that she does not want to wear the hospital CPAP machine. Pt is going to wait until she gets her new machine to start using it again. RT explained to pt the mask options the hospital offers and pt still declined.

## 2021-07-26 NOTE — Progress Notes (Signed)
Mobility Specialist Progress Note:   07/26/21 1100  Mobility  Activity Ambulated in hall  Level of Assistance Contact guard assist, steadying assist  Assistive Device  (HHA)  Distance Ambulated (ft) 200 ft  Mobility Ambulated with assistance in hallway  Mobility Response Tolerated well  Mobility performed by Mobility specialist  $Mobility charge 1 Mobility   Pt received in bed willing to participate in mobility. Pt stated her foot is more sore and swollen. Returned to EOB with call bell in reach and all needs met.   [facility] Public librarian Phone [phone] Secondary Phone [phone]

## 2021-07-26 NOTE — Progress Notes (Signed)
Heart Failure Nurse Navigator Progress Note  Visited pt to introduce HF education and assess for HV TOC readiness. Pt currently getting GI work up for drop in hgb, plan for endo/colonoscopy 12/16.   Will continue to follow.   Pricilla Holm, MSN, RN Heart Failure Nurse Navigator [phone]

## 2021-07-26 NOTE — Progress Notes (Signed)
PROGRESS NOTE    Alexis [name]  BJS:[id number] DOB: Dec 27, 1950 DOA: 07/24/2021 PCP: Deland Pretty, MD   Chief Complaint  Patient presents with   Shortness of Breath    Brief Narrative:   Alexis [name] is a 70 y.o. female with medical history significant of  CHF, A.fib DM2, Gastroparesis, GERD, HTN, HLD, OSA fatty liver disease, IBS, complaining of chest pain or sob. She was admitted for acute on chronic diastolic heart failure.  She was started in IV lasix with good diuresis. Her hemoglobin yesterday evening was around 9 and it has dropped to . 7.1 this am. She was given a unit of PRBC transfusion and repeat hemoglobin is around 10. She is initially scheduled to get a colonoscopy in January as outpatient. But with her drop in hemoglobin overnight, Cardiology recommendations, requested GI consult for further evaluation of GI bleed. Her stool for occult blood is positive.   Assessment & Plan:   Principal Problem:   CHF exacerbation ([place]) Active Problems:   DM2 (diabetes mellitus, type 2) ([place])   Hyperlipidemia   Essential hypertension   GERD   OSA (obstructive sleep apnea)   Obesity (BMI 30-39.9)   Type 2 diabetes mellitus ([place])   Diabetic peripheral neuropathy (HCC)   Paroxysmal atrial fibrillation (HCC)   Acute on chronic respiratory failure with hypoxia (HCC)   Acute on chronic diastolic CHF (congestive heart failure) (HCC)   Hypokalemia   Anemia   Heme positive stool   Chronic diarrhea   Anticoagulated  Acute on chronic diastolic heart failure: / Acute Respiratory failure with hypoxia on 2 lit of [place] oxygen.  Echocardiogram yesterday shows  Left ventricular ejection fraction, by estimation, is 65 to 70%. The  left ventricle has normal function.without any regional wall abnormalities. Left ventricular diastolic parameters are  consistent with Grade II diastolic dysfunction (pseudonormalization).  Lasix was started, 60 mg BID. Strict intake and output. Diuresed  about 3.3 lit since admission.  Continue with spironolactone and Jardiance.    Type 2 DM with hyperglycemia CBG (last 3)  Recent Labs    07/26/21 0730 07/26/21 1113 07/26/21 1610  GLUCAP 118* 161* 125*   Resume SSI.  Resume semglee and SSI.    Hypertension;  Well controlled.    PAF Rate controlled with metoprolol.  On anticoagulation , which is on hold for GI bleed evaluation.    Hyperlipidemia:  - Lipitor.    Acute anemia of blood loss:  Hemoglobin drop to 7.1 yesterday. S/p 1 unit of prbc transfusion.   Fall with right foot pain:  X rays show Mildly displaced proximal fifth metatarsal fracture. Pt is currently in a boot.   Hypokalemia Replaced.    Peripheral neuropathy from DM Resume home meds.   OSA  ON CPAP at night.    Body mass index is 30.75 kg/m. Obesity:    DVT prophylaxis: (scd's) Code Status: (Full Code) Family Communication: Family at bedside.  Disposition:   Status is: Inpatient  Remains inpatient appropriate because: work up for anemia and acute diastolic heart failure.        Consultants:  Cardiology Gastroenterology.   Procedures: echo Lower extremity duplex X ray of the right foot.  Colonoscopy and EGD scheduled for tomorrow.  Antimicrobials:  none.   Subjective: Breathing is the same, on 2 lit of [place] oxygen.   Objective: Vitals:   07/26/21 0011 07/26/21 0407 07/26/21 0727 07/26/21 1115  BP: (!) 142/59 (!) 144/66 (!) 144/55 (!) 134/59  Pulse: 67 72  73 68  Resp: _0 Temp: 98.1 F (36.7 C) 98 F (36.7 C) 98.1 F (36.7 C) 98 F (36.7 C)  TempSrc: Oral Oral Oral Oral  SpO2: 97% 99% 97% 97%  Weight:  83.8 kg    Height:        Intake/Output Summary (Last 24 hours) at 07/26/2021 1611 Last data filed at 07/26/2021 0837 Gross per 24 hour  Intake 820 ml  Output 3900 ml  Net -3080 ml   Filed Weights   07/24/21 2341 07/25/21 1219 07/26/21 0407  Weight: 81.6 kg 86.9 kg 83.8 kg     Examination:  General exam: Appears calm and comfortable  Respiratory system: diminished at bases. On 2lit of [place]  Cardiovascular system: S1 & S2 heard, RRR. No JVD, . Pedal edema present, right more than left. Gastrointestinal system: Abdomen is nondistended, soft and nontender. Normal bowel sounds heard. Central nervous system: Alert and oriented. No focal neurological deficits. Extremities: Symmetric 5 x 5 power. Skin: No rashes, lesions or ulcers Psychiatry: Mood & affect appropriate.     Data Reviewed: I have personally reviewed following labs and imaging studies  CBC: Recent Labs  Lab 07/24/21 2210 07/25/21 0237 07/25/21 1014 07/25/21 1100 07/25/21 1650 07/25/21 2259  WBC 8.0 6.4 7.9 7.5 7.6 7.9  NEUTROABS 5.9 4.5  --   --   --   --   HGB 9.1* 7.1* 10.5* 10.1* 10.2* 10.0*  HCT 28.7* 23.2* 32.5* 31.8* 31.7* 31.8*  MCV 82.7 84.4 82.3 82.6 81.5 82.4  PLT 217 200 238 237 255 354    Basic Metabolic Panel: Recent Labs  Lab 07/24/21 1355 07/24/21 1638 07/24/21 1931 07/24/21 2210 07/25/21 1100 07/25/21 1650 07/26/21 0250  NA 136  --   --  136 137 137 140  K 2.8*  --   --  3.1* 3.2* 3.3* 3.8  CL 98  --   --  103 103 102 103  CO2 28  --   --  _1 GLUCOSE 193*  --   --  205* 135* 161* 103*  BUN 18  --   --  _2 CREATININE 0.90  --   --  0.90 0.93 0.80 0.88  CALCIUM 8.8*  --   --  7.9* 8.0* 8.3* 8.2*  MG  --  2.2  --   --  1.8  --   --   PHOS  --   --  2.3*  --  3.6  --   --     GFR: Estimated Creatinine Clearance: 64.5 mL/min (by C-G formula based on SCr of 0.88 mg/dL).  Liver Function Tests: Recent Labs  Lab 07/24/21 1931 07/25/21 1100  AST 23 34  ALT 28 41  ALKPHOS 62 88  BILITOT 0.8 1.2  PROT 4.5* 6.3*  ALBUMIN 1.8* 2.7*    CBG: Recent Labs  Lab 07/25/21 2009 07/26/21 0008 07/26/21 0403 07/26/21 0730 07/26/21 1113  GLUCAP 187* 144* 103* 118* 161*     Recent Results (from the past 240 hour(s))  Resp Panel by RT-PCR  (Flu A&B, Covid) Nasopharyngeal Swab     Status: None   Collection Time: 07/24/21  2:38 PM   Specimen: Nasopharyngeal Swab; Nasopharyngeal(NP) swabs in vial transport medium  Result Value Ref Range Status   SARS Coronavirus 2 by RT PCR NEGATIVE NEGATIVE Final    Comment: (NOTE) SARS-CoV-2 target nucleic acids are NOT DETECTED.  The SARS-CoV-2 RNA is generally detectable  in upper respiratory specimens during the acute phase of infection. The lowest concentration of SARS-CoV-2 viral copies this assay can detect is 138 copies/mL. A negative result does not preclude SARS-Cov-2 infection and should not be used as the sole basis for treatment or other patient management decisions. A negative result may occur with  improper specimen collection/handling, submission of specimen other than nasopharyngeal swab, presence of viral mutation(s) within the areas targeted by this assay, and inadequate number of viral copies(<138 copies/mL). A negative result must be combined with clinical observations, patient history, and epidemiological information. The expected result is Negative.  Fact Sheet for Patients:  EntrepreneurPulse.com.au  Fact Sheet for Healthcare Providers:  IncredibleEmployment.be  This test is no t yet approved or cleared by the [place] FDA and  has been authorized for detection and/or diagnosis of SARS-CoV-2 by FDA under an Emergency Use Authorization (EUA). This EUA will remain  in effect (meaning this test can be used) for the duration of the COVID-19 declaration under Section 564(b)(1) of the Act, 21 U.S.C.section 360bbb-3(b)(1), unless the authorization is terminated  or revoked sooner.       Influenza A by PCR NEGATIVE NEGATIVE Final   Influenza B by PCR NEGATIVE NEGATIVE Final    Comment: (NOTE) The Xpert Xpress SARS-CoV-2/FLU/RSV plus assay is intended as an aid in the diagnosis of influenza from Nasopharyngeal swab specimens  and should not be used as a sole basis for treatment. Nasal washings and aspirates are unacceptable for Xpert Xpress SARS-CoV-2/FLU/RSV testing.  Fact Sheet for Patients: EntrepreneurPulse.com.au  Fact Sheet for Healthcare Providers: IncredibleEmployment.be  This test is not yet approved or cleared by the [place] FDA and has been authorized for detection and/or diagnosis of SARS-CoV-2 by FDA under an Emergency Use Authorization (EUA). This EUA will remain in effect (meaning this test can be used) for the duration of the COVID-19 declaration under Section 564(b)(1) of the Act, 21 U.S.C. section 360bbb-3(b)(1), unless the authorization is terminated or revoked.  Performed at [facility], [place] [address] [place], [place] [address]          Radiology Studies: DG Foot Complete Right  Result Date: 07/26/2021 CLINICAL DATA:  Right foot pain and swelling. EXAM: RIGHT FOOT COMPLETE - 3+ VIEW COMPARISON:  December 04, 2020. FINDINGS: Mildly displaced fracture is seen involving the proximal base of the fifth metatarsal. No other bony abnormality is noted. No soft tissue abnormality is noted. Joint spaces are intact. IMPRESSION: Mildly displaced proximal fifth metatarsal fracture. Electronically Signed   By: Marijo Conception M.D.   On: 07/26/2021 15:43   ECHOCARDIOGRAM COMPLETE  Result Date: 07/25/2021    ECHOCARDIOGRAM REPORT   Patient Name:   TANZA PELLOT Date of Exam: 07/25/2021 Medical Rec #:  [id number]         Height:       65.0 in Accession #:    [id number]        Weight:       180.0 lb Date of Birth:  03-20-1951         BSA:          1.892 m Patient Age:    52 years          BP:           111/91 mmHg Patient Gender: F                 HR:           63  bpm. Exam Location:  Inpatient Procedure: Cardiac Doppler, Color Doppler and Intracardiac Opacification Agent Indications:    congestive heart failure  History:        Patient has prior  history of Echocardiogram examinations, most                 recent 05/02/2021. CHF, Previous Myocardial Infarction,                 Signs/Symptoms:Shortness of Breath and Syncope; Risk                 Factors:Diabetes, Hypertension and Dyslipidemia.  Sonographer:    Melissa Morford RDCS (AE, PE) Referring Phys: [place]  1. Left ventricular ejection fraction, by estimation, is 65 to 70%. The left ventricle has normal function. The left ventricle has no regional wall motion abnormalities. Left ventricular diastolic parameters are consistent with Grade II diastolic dysfunction (pseudonormalization).  2. Right ventricular systolic function is normal. The right ventricular size is normal.  3. The mitral valve is degenerative. No evidence of mitral valve regurgitation. No evidence of mitral stenosis. Moderate mitral annular calcification.  4. The aortic valve is tricuspid. Aortic valve regurgitation is not visualized. Aortic valve sclerosis is present, with no evidence of aortic valve stenosis.  5. The inferior vena cava is normal in size with <50% respiratory variability, suggesting right atrial pressure of 8 mmHg. Comparison(s): No significant change from prior study. FINDINGS  Left Ventricle: Left ventricular ejection fraction, by estimation, is 65 to 70%. The left ventricle has normal function. The left ventricle has no regional wall motion abnormalities. Definity contrast agent was given IV to delineate the left ventricular  endocardial borders. The left ventricular internal cavity size was normal in size. There is no left ventricular hypertrophy. Left ventricular diastolic parameters are consistent with Grade II diastolic dysfunction (pseudonormalization). Right Ventricle: The right ventricular size is normal. No increase in right ventricular wall thickness. Right ventricular systolic function is normal. The tricuspid regurgitant velocity is 2.81 m/s, and with an assumed right atrial  pressure of 8 mmHg, the estimated right ventricular systolic pressure is 93.2 mmHg. Left Atrium: Left atrial size was normal in size. Right Atrium: Right atrial size was normal in size. Pericardium: There is no evidence of pericardial effusion. Mitral Valve: The mitral valve is degenerative in appearance. Moderate mitral annular calcification. No evidence of mitral valve regurgitation. No evidence of mitral valve stenosis. Tricuspid Valve: The tricuspid valve is normal in structure. Tricuspid valve regurgitation is trivial. No evidence of tricuspid stenosis. Aortic Valve: The aortic valve is tricuspid. Aortic valve regurgitation is not visualized. Aortic valve sclerosis is present, with no evidence of aortic valve stenosis. Pulmonic Valve: The pulmonic valve was not well visualized. Pulmonic valve regurgitation is not visualized. No evidence of pulmonic stenosis. Aorta: The aortic root is normal in size and structure. Venous: The inferior vena cava is normal in size with less than 50% respiratory variability, suggesting right atrial pressure of 8 mmHg. IAS/Shunts: The atrial septum is grossly normal.  LEFT VENTRICLE PLAX 2D LVIDd:         4.60 cm   Diastology LVIDs:         2.80 cm   LV e' medial:    4.68 cm/s LV PW:         1.00 cm   LV E/e' medial:  24.4 LV IVS:        1.00 cm   LV e' lateral:   4.79 cm/s LVOT  diam:     1.90 cm   LV E/e' lateral: 23.8 LV SV:         80 LV SV Index:   42 LVOT Area:     2.84 cm  RIGHT VENTRICLE RV S prime:     14.50 cm/s TAPSE (M-mode): 2.2 cm LEFT ATRIUM             Index        RIGHT ATRIUM           Index LA diam:        4.50 cm 2.38 cm/m   RA Area:     16.90 cm LA Vol (A2C):   51.0 ml 26.96 ml/m  RA Volume:   48.70 ml  25.75 ml/m LA Vol (A4C):   45.4 ml 24.00 ml/m LA Biplane Vol: 48.1 ml 25.43 ml/m  AORTIC VALVE LVOT Vmax:   125.00 cm/s LVOT Vmean:  87.700 cm/s LVOT VTI:    0.283 m  AORTA Ao Root diam: 2.80 cm MITRAL VALVE                TRICUSPID VALVE MV Area (PHT):  5.54 cm     TR Peak grad:   31.6 mmHg MV Decel Time: 137 msec     TR Vmax:        281.00 cm/s MV E velocity: 114.00 cm/s MV A velocity: 107.00 cm/s  SHUNTS MV E/A ratio:  1.07         Systemic VTI:  0.28 m                             Systemic Diam: 1.90 cm Rudean Haskell MD Electronically signed by Rudean Haskell MD Signature Date/Time: 07/25/2021/3:06:53 PM    Final    VAS [place] LOWER EXTREMITY VENOUS (DVT)  Result Date: 07/26/2021  Lower Venous DVT Study Patient Name:  ROXAN YAMAMOTO  Date of Exam:   07/26/2021 Medical Rec #: [id number]          Accession #:    [id number] Date of Birth: 1951/05/15          Patient Gender: F Patient Age:   35 years Exam Location:  [facility] Procedure:      VAS [place] LOWER EXTREMITY VENOUS (DVT) Referring Phys: Hosie Poisson --------------------------------------------------------------------------------  Indications: Swelling, recent fall.  Risk Factors: History of uterine cancer. Comparison Study: 07-27-2019 Lower extremity venous RT for swelling/tingling +1                   year negative for DVT. Performing Technologist: Darlin Coco RDMS, RVT  Examination Guidelines: A complete evaluation includes B-mode imaging, spectral Doppler, color Doppler, and power Doppler as needed of all accessible portions of each vessel. Bilateral testing is considered an integral part of a complete examination. Limited examinations for reoccurring indications may be performed as noted. The reflux portion of the exam is performed with the patient in reverse Trendelenburg.  +---------+---------------+---------+-----------+----------+--------------+  RIGHT     Compressibility Phasicity Spontaneity Properties Thrombus Aging  +---------+---------------+---------+-----------+----------+--------------+  CFV       Full            Yes       Yes                                    +---------+---------------+---------+-----------+----------+--------------+  SFJ  Full                                                              +---------+---------------+---------+-----------+----------+--------------+  FV Prox   Full                                                             +---------+---------------+---------+-----------+----------+--------------+  FV Mid    Full                                                             +---------+---------------+---------+-----------+----------+--------------+  FV Distal Full                                                             +---------+---------------+---------+-----------+----------+--------------+  PFV       Full                                                             +---------+---------------+---------+-----------+----------+--------------+  POP       Full            Yes       Yes                                    +---------+---------------+---------+-----------+----------+--------------+  PTV       Full                                                             +---------+---------------+---------+-----------+----------+--------------+  PERO      Full                                                             +---------+---------------+---------+-----------+----------+--------------+  Gastroc   Full                                                             +---------+---------------+---------+-----------+----------+--------------+   +---------+---------------+---------+-----------+----------+--------------+  LEFT      Compressibility Phasicity Spontaneity Properties Thrombus Aging  +---------+---------------+---------+-----------+----------+--------------+  CFV       Full            Yes       Yes                                    +---------+---------------+---------+-----------+----------+--------------+  SFJ       Full                                                             +---------+---------------+---------+-----------+----------+--------------+  FV Prox   Full                                                              +---------+---------------+---------+-----------+----------+--------------+  FV Mid    Full                                                             +---------+---------------+---------+-----------+----------+--------------+  FV Distal Full                                                             +---------+---------------+---------+-----------+----------+--------------+  PFV       Full                                                             +---------+---------------+---------+-----------+----------+--------------+  POP       Full            Yes       Yes                                    +---------+---------------+---------+-----------+----------+--------------+  PTV       Full                                                             +---------+---------------+---------+-----------+----------+--------------+  PERO      Full                                                             +---------+---------------+---------+-----------+----------+--------------+  Gastroc   Full                                                             +---------+---------------+---------+-----------+----------+--------------+    Summary: RIGHT: - There is no evidence of deep vein thrombosis in the lower extremity.  - No cystic structure found in the popliteal fossa.  - Ultrasound characteristics of enlarged lymph nodes are noted in the groin.  LEFT: - There is no evidence of deep vein thrombosis in the lower extremity.  - No cystic structure found in the popliteal fossa.  - Ultrasound characteristics of enlarged, echogenic lymph nodes noted in the groin.  *See table(s) above for measurements and observations.    Preliminary         Scheduled Meds:  sodium chloride   Intravenous Once   amiodarone  200 mg Oral Daily   atorvastatin  20 mg Oral Daily   empagliflozin  10 mg Oral Daily   furosemide  60 mg Intravenous BID   insulin aspart  0-9 Units Subcutaneous Q4H   insulin glargine-yfgn  20 Units Subcutaneous  QHS   irbesartan  300 mg Oral Daily   metoCLOPramide (REGLAN) injection  10 mg Intravenous Q6H   metoprolol tartrate  25 mg Oral BID   peg 3350 powder  0.5 kit Oral Once   And   peg 3350 powder  0.5 kit Oral Once   potassium chloride  40 mEq Oral TID   pregabalin  50 mg Oral BID   sodium chloride flush  3 mL Intravenous Q12H   spironolactone  25 mg Oral Daily   tamsulosin  0.4 mg Oral QHS   venlafaxine XR  150 mg Oral BID   Continuous Infusions:  sodium chloride       LOS: 2 days       Hosie Poisson, MD [facility]   To contact the attending provider between 7A-7P or the covering provider during after hours 7P-7A, please log into the web site [email or web address] and access using universal [place] password for that web site. If you do not have the password, please call the hospital operator.  07/26/2021, 4:11 PM

## 2021-07-26 NOTE — Progress Notes (Signed)
Lower extremity venous bilateral study completed.   Please see CV Proc for preliminary results.   Floyed Masoud, RDMS, RVT  

## 2021-07-26 NOTE — H&P (View-Only) (Signed)
[facility] Consult: 8:16 AM 07/26/2021  LOS: 2 days    Referring Provider: Dr Karleen Hampshire  Primary Care Physician:  Deland Pretty, MD Primary Gastroenterologist:  Dr. Loletha Carrow, previously Olevia Perches.       Reason for Consultation:  FOBT + Anemia.     HPI: Alexis [name] is a 70 y.o. female.  PMH IDDM.  Fibromyalgia.  Diabetic gastroparesis.  Idiopathic constipation.  GERD.  IBS.  Previous empiric treatment for diarrhea,? SBO with Flagyl.  S/p cholecystectomy.  Uterine cancer, 2000 hysterectomy.    Fatty liver per 2007 [place].  Diastolic heart failure.  OSA, prescribed CPAP but she takes it off in her sleep, recently got prescription for a different CPAP apparatus but has not filled this yet.  Obesity.  PAF, chronic Eliquis.  Repair of hip fracture w L TAH 12/05/2020.  Perioperative anemia with Hb 6.4, transfused 2 PRBCs in late April  2012 colonoscopy unremarkable.  Random biopsies negative for microscopic colitis.   2013 EGD: Nonspecific gastritis, biopsies negative for H. pylori. Hyperplastic gastric polyp.  Esophageal biopsies negative for Barrett's.  Duodenal biopsies negative for celiac disease FOBT + stool in 11/2020.    GI OV 07/04/2019 for evaluation of bloating, diarrhea with loose to watery but not bloody or melenic stool.  Symptoms are particularly prominent postprandial along with some postprandial abdominal pain.  Patient has not tried taking any antidiarrheals.  She had run out of her prescription for Aciphex in the previous 90 days, this was renewed with a generic form of Aciphex about 2 weeks ago.  Denies significant pyrosis and dysphagia.  Dr. Loletha Carrow set her up for colonoscopy in early January.  Presented to the ED 2 days ago with fatigue, weakness over the last 6 months but recently worsening.  Chest pain,  orthopnea.  Self measured oxygen saturations in the 60s.  Also reports falling in recent weeks due to leg giving out.  Current diagnosis is acute on chronic diastolic heart failure.  Hypokalemia has been corrected.  Hgb 7.1 >> 1 PRBC >> 10.   9.5 in mid October, 3 months ago.  MCV 82, was 78 (low) 3 months ago.  Iron, TIBC, ferritin, b12, folate normal/above normal, low iron sats w no recent comps.   No coags.   Albumin low (present in April 2022), LFTs normal Renal fx normal.   A1c 8.    BNP 868, minor Trop elevation at 20.  TSH normal.      CXR with congestive heart failure, cardiomegaly, aortic atherosclerosis, pulmonary venous hypertension, small pleural effusions, atelectasis.  Last dose of Eliquis 12/14.     Maternal side of the family uncle and aunts have had cancers of the breast, stomach, colon.  Father died with lung cancer. No alcohol.  Lives with her husband, daughter and grandson.  She helps take care of her brother.    Past Medical History:  Diagnosis Date   Anxiety    Arthritis    "knees" (05/25/2014)   Chronic back pain    Depression    Diabetes mellitus type II  Diabetic peripheral neuropathy ([place]) 10/20/2018   Diverticulitis    Fatty liver    Fibromyalgia    Gastric polyp    hyperplastic   Gastroparesis    "recently dx'd" (05/25/2014)   GERD (gastroesophageal reflux disease)    H/O hiatal hernia    Hx of gastritis    Hyperlipidemia    Hypertension    Irritable bowel syndrome (IBS)    Memory difficulties 09/18/2017   Migraine without aura, without mention of intractable migraine without mention of status migrainosus    "related to allergies; have them in the spring and fall" (05/25/2014)   Obesity    Sleep apnea    "wore mask; took it off in my sleep; quit wearing it" (05/25/2014)   Uterine cancer ([place]) dx'd 2000   surg only   Vasculitis ([place])    "irritates my legs"   Wears glasses     Past Surgical History:  Procedure Laterality Date   ANTERIOR  CERVICAL DECOMPRESSION/DISCECTOMY FUSION 4 LEVELS N/A 05/07/2018   Procedure: ANTERIOR CERVICAL DECOMPRESSION FUSION, CERVICAL 4-5,CERVICAL 5-6, CERVICAL 6-7 WITH INSTRUMENTATION AND ALLOGRAFT;  Surgeon: Phylliss Bob, MD;  Location: [place];  Service: Orthopedics;  Laterality: N/A;   APPENDECTOMY  ~ [place]   LEFT HEART CATHETERIZATION WITH CORONARY ANGIOGRAM N/A 05/26/2014   Procedure: LEFT HEART CATHETERIZATION WITH CORONARY ANGIOGRAM;  Surgeon: Troy Sine, MD;  Location: [facility] CATH LAB;  Service: Cardiovascular;  Laterality: N/A;   SINUS SURGERY WITH INSTATRAK  2000   TOTAL HIP ARTHROPLASTY Left 12/05/2020   Procedure: TOTAL HIP ARTHROPLASTY ANTERIOR APPROACH;  Surgeon: Rod Can, MD;  Location: [place];  Service: Orthopedics;  Laterality: Left;   TUBAL LIGATION  ~ [place]  2000    Prior to Admission medications   Medication Sig Start Date End Date Taking? Authorizing Provider  acetaminophen (TYLENOL) 325 MG tablet Take 2 tablets (650 mg total) by mouth every 6 (six) hours as needed for mild pain or moderate pain. 12/12/20  Yes Nolberto Hanlon, MD  ALPRAZolam Duanne Moron) 0.5 MG tablet Take 0.5 mg by mouth at bedtime.   Yes [provider]  amiodarone (PACERONE) 200 MG tablet Take 1 tablet (200 mg total) by mouth daily. Patient taking differently: Take 200 mg by mouth 2 (two) times daily. 05/14/21  Yes Hilty, Nadean Corwin, MD  amLODipine (NORVASC) 5 MG tablet Take 5 mg by mouth every evening.   Yes [provider]  apixaban (ELIQUIS) 5 MG TABS tablet Take 1 tablet (5 mg total) by mouth 2 (two) times daily. 12/12/20  Yes Nolberto Hanlon, MD  b complex vitamins tablet Take 1 tablet by mouth at bedtime.   Yes [provider]  FARXIGA 10 MG TABS tablet Take 10 mg by mouth daily. 09/10/19  Yes [provider]  fish oil-omega-3 fatty acids 1000 MG capsule Take 1 g by mouth  at bedtime.    Yes [provider]  furosemide (LASIX) 20 MG tablet Take 1 tablet (20 mg total) by mouth daily. 05/14/21 08/12/21 Yes Hilty, Nadean Corwin, MD  insulin lispro (HUMALOG) 100 UNIT/ML KwikPen Inject 8 Units into the skin 3 (three) times daily as needed (high blood sugar). Per sliding scale   Yes [provider]  metFORMIN (GLUCOPHAGE-XR) 500 MG 24 hr tablet Take 500 mg by mouth daily. 08/24/19  Yes [provider]  metoprolol succinate (TOPROL-XL) 50 MG  24 hr tablet Take 2 tablets (100 mg total) by mouth in the morning and at bedtime. Take with or immediately following a meal. 01/29/21  Yes Almyra Deforest, PA  pregabalin (LYRICA) 50 MG capsule Take 50 mg by mouth 2 (two) times daily.   Yes [provider]  RABEprazole (ACIPHEX) 20 MG tablet Take 1 tablet (20 mg total) by mouth 2 (two) times daily. 04/22/12  Yes Lafayette Dragon, MD  tamsulosin (FLOMAX) 0.4 MG CAPS capsule Take 0.4 mg by mouth at bedtime. 07/17/16  Yes [provider]  TRESIBA FLEXTOUCH 100 UNIT/ML FlexTouch Pen Inject 20 Units into the skin at bedtime. 09/29/19  Yes [provider]  TRULICITY 1.5 GQ/6.7YP SOPN Inject 1.5 mg into the skin once a week. 05/07/21  Yes [provider]  valsartan (DIOVAN) 320 MG tablet Take 320 mg by mouth daily.   Yes [provider]  venlafaxine XR (EFFEXOR-XR) 150 MG 24 hr capsule Take 150 mg by mouth 2 (two) times daily.   Yes [provider]  zaleplon (SONATA) 5 MG capsule Take 5 mg by mouth at bedtime.   Yes [provider]    Scheduled Meds:  sodium chloride   Intravenous Once   amiodarone  200 mg Oral Daily   atorvastatin  20 mg Oral Daily   empagliflozin  10 mg Oral Daily   furosemide  60 mg Intravenous BID   insulin aspart  0-9 Units Subcutaneous Q4H   insulin glargine-yfgn  20 Units Subcutaneous QHS   irbesartan  300 mg Oral Daily   metoprolol tartrate  25 mg Oral BID   potassium chloride  40 mEq Oral TID    pregabalin  50 mg Oral BID   sodium chloride flush  3 mL Intravenous Q12H   spironolactone  25 mg Oral Daily   tamsulosin  0.4 mg Oral QHS   venlafaxine XR  150 mg Oral BID   Infusions:  sodium chloride     PRN Meds: sodium chloride, acetaminophen **OR** acetaminophen, ALPRAZolam, sodium chloride flush   Allergies as of 07/24/2021 - Review Complete 07/24/2021  Allergen Reaction Noted   Aspirin Other (See Comments) 02/09/2014   Codeine Itching and Rash 08/24/2010   Naproxen Nausea Only 02/09/2014   Sulfonamide derivatives Itching and Rash 08/24/2010    Family History  Problem Relation Age of Onset   Breast cancer Sister    Colon polyps Father    Heart disease Father    Colon cancer Maternal Uncle    Ovarian cancer Maternal Aunt    Stomach cancer Maternal Aunt    Diabetes Maternal Aunt    Heart disease Maternal Uncle    Heart disease Other        Grandparents   Irritable bowel syndrome Daughter     Social History   Socioeconomic History   Marital status: Married    Spouse name: Lavana Huckeba   Number of children: 2   Years of education: Not on file   Highest education level: Some college, no degree  Occupational History   Occupation: Retired    Fish farm manager: RETIRED  Tobacco Use   Smoking status: Never   Smokeless tobacco: Never  Vaping Use   Vaping Use: Never used  Substance and Sexual Activity   Alcohol use: No   Drug use: No   Sexual activity: Not Currently  Other Topics Concern   Not on file  Social History Narrative   Daily Caffeine, Coke   Social Determinants of Health  Financial Resource Strain: Low Risk    Difficulty of Paying Living Expenses: Not very hard  Food Insecurity: No Food Insecurity   Worried About Charity fundraiser in the Last Year: Never true   Ran Out of Food in the Last Year: Never true  Transportation Needs: No Transportation Needs   Lack of Transportation (Medical): No   Lack of Transportation (Non-Medical): No  Physical  Activity: Not on file  Stress: Not on file  Social Connections: Not on file  Intimate Partner Violence: Not on file    REVIEW OF SYSTEMS: Constitutional: Weakness and fatigue somewhat better ENT:  No nose bleeds Pulm: Dyspnea improved but not resolved CV:  No palpitations, no LE edema.  No angina during admission. GU:  No hematuria, no frequency GI: See HPI. Heme: Denies significant purpura or bruising.  No overt bleeding. Transfusions: See HPI. Neuro:  No headaches, no peripheral tingling or numbness Derm:  No itching, no rash or sores.  Endocrine:  No sweats or chills.  No polyuria or dysuria Immunization: Reviewed. Travel:  None beyond local counties in last few months.    PHYSICAL EXAM: Vital signs in last 24 hours: Vitals:   07/26/21 0407 07/26/21 0727  BP: (!) 144/66 (!) 144/55  Pulse: 72 73  Resp: 18 18  Temp: 98 F (36.7 C) 98.1 F (36.7 C)  SpO2: 99% 97%   Wt Readings from Last 3 Encounters:  07/26/21 83.8 kg  07/03/21 83 kg  05/14/21 79.8 kg    General: Patient is overweight, does not look acutely ill, actually appears pretty well.  Talkative. Head:  No facial asymmetry or swelling.  No signs of head trauma.  Eyes: Conjunctiva pink.  No scleral icterus.  EOMI. Ears: Not hard of hearing Nose: No congestion or discharge Mouth: Good dentition.  Tongue midline.  Mucosa moist, pink, clear. Neck: No JVD, no masses, no thyromegaly Lungs: Clear bilaterally.  No labored breathing, no cough. Heart: RRR.  No MRG.  S1, S2 present Abdomen: Soft, not tender, not distended.  No HSM, masses.   Rectal: Deferred Musc/Skeltl: No gross joint deformities, swelling or redness Extremities: Minor, nonpitting pedal edema. Neurologic: Fully alert and oriented.  Detailed historian.  Moves all 4 limbs without tremor, strength not formally tested but no gross deficits observed. Skin: No significant purpura, bruising, rash or suspicious lesions Nodes: No cervical adenopathy Psych:  A bit anxious but pleasant, cooperative  Intake/Output from previous day: 12/14 0701 - 12/15 0700 In: 1276.4 [P.O.:720; Blood:300; IV Piggyback:256.4] Out: 4300 [Urine:4300] Intake/Output this shift: No intake/output data recorded.  LAB RESULTS: Recent Labs    07/25/21 1100 07/25/21 1650 07/25/21 2259  WBC 7.5 7.6 7.9  HGB 10.1* 10.2* 10.0*  HCT 31.8* 31.7* 31.8*  PLT 237 255 255   BMET Lab Results  Component Value Date   NA 140 07/26/2021   NA 137 07/25/2021   NA 137 07/25/2021   K 3.8 07/26/2021   K 3.3 (L) 07/25/2021   K 3.2 (L) 07/25/2021   CL 103 07/26/2021   CL 102 07/25/2021   CL 103 07/25/2021   CO2 29 07/26/2021   CO2 26 07/25/2021   CO2 26 07/25/2021   GLUCOSE 103 (H) 07/26/2021   GLUCOSE 161 (H) 07/25/2021   GLUCOSE 135 (H) 07/25/2021   BUN 12 07/26/2021   BUN 14 07/25/2021   BUN 13 07/25/2021   CREATININE 0.88 07/26/2021   CREATININE 0.80 07/25/2021   CREATININE 0.93 07/25/2021   CALCIUM 8.2 (L)  07/26/2021   CALCIUM 8.3 (L) 07/25/2021   CALCIUM 8.0 (L) 07/25/2021   LFT Recent Labs    07/24/21 1931 07/25/21 1100  PROT 4.5* 6.3*  ALBUMIN 1.8* 2.7*  AST 23 34  ALT 28 41  ALKPHOS 62 88  BILITOT 0.8 1.2  BILIDIR 0.2  --   IBILI 0.6  --    PT/INR Lab Results  Component Value Date   INR 0.97 05/07/2018   INR 1.04 05/26/2014   Hepatitis Panel No results for input(s): HEPBSAG, HCVAB, HEPAIGM, HEPBIGM in the last 72 hours. C-Diff No components found for: CDIFF Lipase     Component Value Date/Time   LIPASE 36 02/09/2014 1339    Drugs of Abuse  No results found for: LABOPIA, COCAINSCRNUR, LABBENZ, AMPHETMU, THCU, LABBARB   RADIOLOGY STUDIES: DG Chest 2 View  Result Date: 07/24/2021 CLINICAL DATA:  Shortness of breath and hypoxia EXAM: CHEST - 2 VIEW COMPARISON:  04/26/2021 FINDINGS: Cardiomegaly. Aortic atherosclerosis. Pulmonary venous hypertension. Bilateral pleural effusions with dependent pulmonary atelectasis. Findings  consistent with congestive heart failure. Upper lungs are otherwise clear. IMPRESSION: Congestive heart failure. Cardiomegaly, aortic atherosclerosis, pulmonary venous hypertension, small pleural effusions and dependent atelectasis. Electronically Signed   By: Nelson Chimes M.D.   On: 07/24/2021 14:39   ECHOCARDIOGRAM COMPLETE  Result Date: 07/25/2021    ECHOCARDIOGRAM REPORT   Patient Name:   BRITHNEY BENSEN Date of Exam: 07/25/2021 Medical Rec #:  [id number]         Height:       65.0 in Accession #:    [id number]        Weight:       180.0 lb Date of Birth:  30-Mar-1951         BSA:          1.892 m Patient Age:    89 years          BP:           111/91 mmHg Patient Gender: F                 HR:           63 bpm. Exam Location:  Inpatient Procedure: Cardiac Doppler, Color Doppler and Intracardiac Opacification Agent Indications:    congestive heart failure  History:        Patient has prior history of Echocardiogram examinations, most                 recent 05/02/2021. CHF, Previous Myocardial Infarction,                 Signs/Symptoms:Shortness of Breath and Syncope; Risk                 Factors:Diabetes, Hypertension and Dyslipidemia.  Sonographer:    Melissa Morford RDCS (AE, PE) Referring Phys: [place]  1. Left ventricular ejection fraction, by estimation, is 65 to 70%. The left ventricle has normal function. The left ventricle has no regional wall motion abnormalities. Left ventricular diastolic parameters are consistent with Grade II diastolic dysfunction (pseudonormalization).  2. Right ventricular systolic function is normal. The right ventricular size is normal.  3. The mitral valve is degenerative. No evidence of mitral valve regurgitation. No evidence of mitral stenosis. Moderate mitral annular calcification.  4. The aortic valve is tricuspid. Aortic valve regurgitation is not visualized. Aortic valve sclerosis is present, with no evidence of aortic valve stenosis.  5.  The inferior vena cava is normal  in size with <50% respiratory variability, suggesting right atrial pressure of 8 mmHg. Comparison(s): No significant change from prior study. FINDINGS  Left Ventricle: Left ventricular ejection fraction, by estimation, is 65 to 70%. The left ventricle has normal function. The left ventricle has no regional wall motion abnormalities. Definity contrast agent was given IV to delineate the left ventricular  endocardial borders. The left ventricular internal cavity size was normal in size. There is no left ventricular hypertrophy. Left ventricular diastolic parameters are consistent with Grade II diastolic dysfunction (pseudonormalization). Right Ventricle: The right ventricular size is normal. No increase in right ventricular wall thickness. Right ventricular systolic function is normal. The tricuspid regurgitant velocity is 2.81 m/s, and with an assumed right atrial pressure of 8 mmHg, the estimated right ventricular systolic pressure is 10.6 mmHg. Left Atrium: Left atrial size was normal in size. Right Atrium: Right atrial size was normal in size. Pericardium: There is no evidence of pericardial effusion. Mitral Valve: The mitral valve is degenerative in appearance. Moderate mitral annular calcification. No evidence of mitral valve regurgitation. No evidence of mitral valve stenosis. Tricuspid Valve: The tricuspid valve is normal in structure. Tricuspid valve regurgitation is trivial. No evidence of tricuspid stenosis. Aortic Valve: The aortic valve is tricuspid. Aortic valve regurgitation is not visualized. Aortic valve sclerosis is present, with no evidence of aortic valve stenosis. Pulmonic Valve: The pulmonic valve was not well visualized. Pulmonic valve regurgitation is not visualized. No evidence of pulmonic stenosis. Aorta: The aortic root is normal in size and structure. Venous: The inferior vena cava is normal in size with less than 50% respiratory variability, suggesting  right atrial pressure of 8 mmHg. IAS/Shunts: The atrial septum is grossly normal.  LEFT VENTRICLE PLAX 2D LVIDd:         4.60 cm   Diastology LVIDs:         2.80 cm   LV e' medial:    4.68 cm/s LV PW:         1.00 cm   LV E/e' medial:  24.4 LV IVS:        1.00 cm   LV e' lateral:   4.79 cm/s LVOT diam:     1.90 cm   LV E/e' lateral: 23.8 LV SV:         80 LV SV Index:   42 LVOT Area:     2.84 cm  RIGHT VENTRICLE RV S prime:     14.50 cm/s TAPSE (M-mode): 2.2 cm LEFT ATRIUM             Index        RIGHT ATRIUM           Index LA diam:        4.50 cm 2.38 cm/m   RA Area:     16.90 cm LA Vol (A2C):   51.0 ml 26.96 ml/m  RA Volume:   48.70 ml  25.75 ml/m LA Vol (A4C):   45.4 ml 24.00 ml/m LA Biplane Vol: 48.1 ml 25.43 ml/m  AORTIC VALVE LVOT Vmax:   125.00 cm/s LVOT Vmean:  87.700 cm/s LVOT VTI:    0.283 m  AORTA Ao Root diam: 2.80 cm MITRAL VALVE                TRICUSPID VALVE MV Area (PHT): 5.54 cm     TR Peak grad:   31.6 mmHg MV Decel Time: 137 msec     TR Vmax:  281.00 cm/s MV E velocity: 114.00 cm/s MV A velocity: 107.00 cm/s  SHUNTS MV E/A ratio:  1.07         Systemic VTI:  0.28 m                             Systemic Diam: 1.90 cm Rudean Haskell MD Electronically signed by Rudean Haskell MD Signature Date/Time: 07/25/2021/3:06:53 PM    Final       IMPRESSION:     FOBT + anemia.  Per recent labs not B12, folate or iron deficient.    Acute on chronic diastolic heart failure  OSA.  Previously noncompliant with CPAP but recently reinitiated on CPAP.     Hypoalbuminemia.     Diarrhea.  May be a combination of her IBS as well as meds (Glucophage).  Has had this problem in past years with unrevealing colonoscopy and biopsies.     IDDM.  A1c 8.    PAF.  Chronic Eliquis, last dose 12/14 at 02 100.Marland Kitchen      PLAN:     EGD and colonoscopy for tomorrow, see orders.   Azucena Freed  07/26/2021, 8:16 AM Phone [phone]

## 2021-07-27 ENCOUNTER — Encounter (HOSPITAL_COMMUNITY): Payer: Self-pay | Admitting: Internal Medicine

## 2021-07-27 ENCOUNTER — Inpatient Hospital Stay (HOSPITAL_COMMUNITY): Payer: Medicare Other | Admitting: Anesthesiology

## 2021-07-27 ENCOUNTER — Encounter (HOSPITAL_COMMUNITY)
Admission: EM | Disposition: A | Discharge status: Home / Self Care | Payer: Self-pay | Source: Home / Self Care | Attending: Internal Medicine

## 2021-07-27 DIAGNOSIS — D126 Benign neoplasm of colon, unspecified: Secondary | ICD-10-CM

## 2021-07-27 DIAGNOSIS — K635 Polyp of colon: Secondary | ICD-10-CM

## 2021-07-27 DIAGNOSIS — K317 Polyp of stomach and duodenum: Secondary | ICD-10-CM

## 2021-07-27 HISTORY — PX: POLYPECTOMY: SHX5525

## 2021-07-27 HISTORY — PX: BIOPSY: SHX5522

## 2021-07-27 HISTORY — PX: COLONOSCOPY WITH PROPOFOL: SHX5780

## 2021-07-27 HISTORY — PX: HEMOSTASIS CLIP PLACEMENT: SHX6857

## 2021-07-27 HISTORY — PX: SCLEROTHERAPY: SHX6841

## 2021-07-27 HISTORY — PX: ESOPHAGOGASTRODUODENOSCOPY (EGD) WITH PROPOFOL: SHX5813

## 2021-07-27 LAB — GLUCOSE, CAPILLARY
Glucose-Capillary: 107 mg/dL — ABNORMAL HIGH (ref 70–99)
Glucose-Capillary: 122 mg/dL — ABNORMAL HIGH (ref 70–99)
Glucose-Capillary: 137 mg/dL — ABNORMAL HIGH (ref 70–99)
Glucose-Capillary: 142 mg/dL — ABNORMAL HIGH (ref 70–99)
Glucose-Capillary: 146 mg/dL — ABNORMAL HIGH (ref 70–99)

## 2021-07-27 LAB — CBC
HCT: 35.1 % — ABNORMAL LOW (ref 36.0–46.0)
Hemoglobin: 11 g/dL — ABNORMAL LOW (ref 12.0–15.0)
MCH: 26.2 pg (ref 26.0–34.0)
MCHC: 31.3 g/dL (ref 30.0–36.0)
MCV: 83.6 fL (ref 80.0–100.0)
Platelets: 295 10*3/uL (ref 150–400)
RBC: 4.2 MIL/uL (ref 3.87–5.11)
RDW: 17.3 % — ABNORMAL HIGH (ref 11.5–15.5)
WBC: 7.8 10*3/uL (ref 4.0–10.5)
nRBC: 0 % (ref 0.0–0.2)

## 2021-07-27 LAB — BASIC METABOLIC PANEL
Anion gap: 7 (ref 5–15)
BUN: 10 mg/dL (ref 8–23)
CO2: 29 mmol/L (ref 22–32)
Calcium: 8.8 mg/dL — ABNORMAL LOW (ref 8.9–10.3)
Chloride: 103 mmol/L (ref 98–111)
Creatinine, Ser: 0.65 mg/dL (ref 0.44–1.00)
GFR, Estimated: >60 mL/min (ref >60)
Glucose, Bld: 123 mg/dL — ABNORMAL HIGH (ref 70–99)
Potassium: 5 mmol/L (ref 3.5–5.1)
Sodium: 139 mmol/L (ref 135–145)

## 2021-07-27 SURGERY — ESOPHAGOGASTRODUODENOSCOPY (EGD) WITH PROPOFOL
Anesthesia: Monitor Anesthesia Care

## 2021-07-27 MED ORDER — PROPOFOL 500 MG/50ML IV EMUL
INTRAVENOUS | Status: DC | PRN
  Administered 2021-07-27: 100 ug/kg/min via INTRAVENOUS

## 2021-07-27 MED ORDER — PHENYLEPHRINE 40 MCG/ML (10ML) SYRINGE FOR IV PUSH (FOR BLOOD PRESSURE SUPPORT)
PREFILLED_SYRINGE | INTRAVENOUS | Status: DC | PRN
  Administered 2021-07-27 (×3): 80 ug via INTRAVENOUS

## 2021-07-27 MED ORDER — PROPOFOL 10 MG/ML IV BOLUS
INTRAVENOUS | Status: DC | PRN
  Administered 2021-07-27: 50 mg via INTRAVENOUS
  Administered 2021-07-27: 20 mg via INTRAVENOUS
  Administered 2021-07-27: 30 mg via INTRAVENOUS
  Administered 2021-07-27: 50 mg via INTRAVENOUS

## 2021-07-27 MED ORDER — LACTATED RINGERS IV SOLN: INTRAVENOUS | Status: DC | PRN

## 2021-07-27 MED ORDER — SODIUM CHLORIDE (PF) 0.9 % IJ SOLN
PREFILLED_SYRINGE | INTRAMUSCULAR | Status: DC | PRN
  Administered 2021-07-27: 2 mL
  Administered 2021-07-27: 3 mL

## 2021-07-27 SURGICAL SUPPLY — 25 items
BLOCK BITE 60FR ADLT L/F BLUE (MISCELLANEOUS) ×5 IMPLANT
ELECT REM PT RETURN 9FT ADLT (ELECTROSURGICAL)
ELECTRODE REM PT RTRN 9FT ADLT (ELECTROSURGICAL) IMPLANT
FCP BXJMBJMB 240X2.8X (CUTTING FORCEPS)
FLOOR PAD 36X40 (MISCELLANEOUS) ×4
FORCEP RJ3 GP 1.8X160 W-NEEDLE (CUTTING FORCEPS) IMPLANT
FORCEPS BIOP RAD 4 LRG CAP 4 (CUTTING FORCEPS) IMPLANT
FORCEPS BIOP RJ4 240 W/NDL (CUTTING FORCEPS)
FORCEPS BXJMBJMB 240X2.8X (CUTTING FORCEPS) IMPLANT
INJECTOR/SNARE I SNARE (MISCELLANEOUS) IMPLANT
LUBRICANT JELLY 4.5OZ STERILE (MISCELLANEOUS) IMPLANT
MANIFOLD NEPTUNE II (INSTRUMENTS) IMPLANT
NDL SCLEROTHERAPY 25GX240 (NEEDLE) IMPLANT
NEEDLE SCLEROTHERAPY 25GX240 (NEEDLE) IMPLANT
PAD FLOOR 36X40 (MISCELLANEOUS) ×3 IMPLANT
PROBE APC STR FIRE (PROBE) IMPLANT
PROBE INJECTION GOLD (MISCELLANEOUS)
PROBE INJECTION GOLD 7FR (MISCELLANEOUS) IMPLANT
SNARE ROTATE MED OVAL 20MM (MISCELLANEOUS) IMPLANT
SNARE SHORT THROW 13M SML OVAL (MISCELLANEOUS) IMPLANT
SYR 50ML LL SCALE MARK (SYRINGE) IMPLANT
TRAP SPECIMEN MUCOUS 40CC (MISCELLANEOUS) IMPLANT
TUBING ENDO SMARTCAP PENTAX (MISCELLANEOUS) ×10 IMPLANT
TUBING IRRIGATION ENDOGATOR (MISCELLANEOUS) ×5 IMPLANT
WATER STERILE IRR 1000ML POUR (IV SOLUTION) IMPLANT

## 2021-07-27 NOTE — TOC Progression Note (Signed)
Transition of Care [facility]) - Progression Note    Patient Details  Name: KAYANA THOEN MRN: [id number] Date of Birth: 1950/10/14  [facility] CM/SW Contact  Zenon Mayo, RN Phone Number: 07/27/2021, 6:35 PM  Clinical Narrative:    NCM spoke with patient, asked her if she remembered the name of the outpatient physical therapy facility she wanted to go to. She states she forgot to aske her husband, but she will ask him.  NCM informed her when she finds out to let NCM on [facility] team know which one it is.   Expected Discharge Plan: Home/Self Care Barriers to Discharge: Continued Medical Work up  Expected Discharge Plan and Services Expected Discharge Plan: Home/Self Care   Discharge Planning Services: CM Consult Post Acute Care Choice: NA Living arrangements for the past 2 months: Single Family Home                   DME Agency: NA       HH Arranged: NA           Social Determinants of Health (SDOH) Interventions    Readmission Risk Interventions Readmission Risk Prevention Plan 07/25/2021  Transportation Screening Complete  PCP or Specialist Appt within 3-5 Days Complete  HRI or [place] Complete  Social Work Consult for [place] Planning/Counseling Complete  Palliative Care Screening Not Applicable  Medication Review Press photographer) Complete  Some recent data might be hidden

## 2021-07-27 NOTE — Op Note (Signed)
[facility] Patient Name: Alexis [name] Procedure Date : 07/27/2021 MRN: [id number] Attending MD: Carlota Raspberry. Havery Moros , MD Date of Birth: May 16, 1951 CSN: [id number] Age: 70 Admit Type: Inpatient Procedure:                Upper GI endoscopy Indications:              Heme positive stool with anemia, bloating, chronic                            diarrhea - on Eliquis Providers:                Carlota Raspberry. Havery Moros, MD, Doristine Johns, RN,                            Lodema Hong Technician, Technician Referring MD:              Medicines:                Monitored Anesthesia Care Complications:            No immediate complications. Estimated blood loss:                            Minimal. Estimated Blood Loss:     Estimated blood loss was minimal. Procedure:                Pre-Anesthesia Assessment:                           - Prior to the procedure, a History and Physical                            was performed, and patient medications and                            allergies were reviewed. The patient's tolerance of                            previous anesthesia was also reviewed. The risks                            and benefits of the procedure and the sedation                            options and risks were discussed with the patient.                            All questions were answered, and informed consent                            was obtained. Prior Anticoagulants: The patient has                            taken Eliquis (apixaban), last dose was 3 days  prior to procedure. ASA Grade Assessment: III - A                            patient with severe systemic disease. After                            reviewing the risks and benefits, the patient was                            deemed in satisfactory condition to undergo the                            procedure.                           After obtaining informed consent, the  endoscope was                            passed under direct vision. Throughout the                            procedure, the patient's blood pressure, pulse, and                            oxygen saturations were monitored continuously. The                            [id number]-H190 ([id number]) Olympus endoscope was introduced                            through the mouth, and advanced to the second part                            of duodenum. The upper GI endoscopy was                            accomplished without difficulty. The patient                            tolerated the procedure well. Scope In: Scope Out: Findings:      The Z-line was slightly irregular but did not seem to meet criteria for       Barrett's esophagus.      A small hiatal hernia was present.      The exam of the esophagus was otherwise normal.      A single 12 to 15 mm semi-sessile / pedunculated polyp was found in the       cardia, with another smaller benign appearing polyp in close       approximation. It appeared inflamed / erythematous - inflammatory or       hyperplastic appearing. The base was successfully injected with 5 mL of       a 1:100,000 diluted solution of epinephrine for drug delivery which       blanched the area. The main polyp was removed with a hot snare. The  smaller polypoid lesion near it could not be reached with snare in       either forward or retroflexed position to remove it. There was some       oozing post polypectomy which stopped. I could not remove the polyp in       the retroflexed position given it was too angulated to get close to the       area with any snare / clip, it was all removed in the forward position.       To prevent bleeding after the polypectomy, two hemostatic clips were       successfully placed in the forward position, again could not get close       to the area in retroflexed position. Clip closed the superior portion of       the polypectomy site but could not  get the clip to attach to the       inferior portion of it due to location, following multiple attempts.       There was no bleeding post procedure.      A single 5 mm sessile polyp with adherent heme was found in the gastric       antrum. The polyp was removed with a hot snare. Resection and retrieval       were complete. To prevent bleeding after the polypectomy, two hemostatic       clips were successfully placed.      The exam of the stomach was otherwise normal. Other smaller benign       appearing polyps were noted and not removed.      Biopsies were taken with a cold forceps in the gastric body, at the       incisura and in the gastric antrum for Helicobacter pylori testing.      The duodenal bulb and second portion of the duodenum were normal.       Biopsies for histology were taken with a cold forceps for evaluation of       celiac disease. Impression:               - Z-line irregular but did not meet criteria for                            Barrett's.                           - Small hiatal hernia.                           - Large gastric polyp in the cardia which I suspect                            is the cause of heme positive stool in the setting                            of anticoagulation - very inflamed. Resected and                            retrieved although challenging to remove this and  could only be reached in forward viewing position .                            Smaller polypoid lesion in this area could not be                            reached to be removed. Clips were placed                            prophylactically given need to resume                            anticoagulation in the future, however this lesion                            is at risk for rebleeding, defect was not able to                            be completely closed.                           - A single gastric polyp in the antrum. Resected                             and retrieved. Clips were placed.                           - Benign appearing other smaller polyps                           - Normal stomach otherwise - biopsies taken to rule                            out H pylori                           - Normal duodenal bulb and second portion of the                            duodenum. Biopsied. Recommendation:           - Return patient to hospital ward for ongoing care.                           - Clear liquid diet today.                           - Continue present medications.                           - Continue to hold Eliquis for now, at risk for                            rebleeding, await her course overnight, consider  resumption in 3 days or so                           - Await pathology results.                           - Will reassess in the AM, call with questions. Procedure Code(s):        --- Professional ---                           [id number], Esophagogastroduodenoscopy, flexible,                            transoral; with removal of tumor(s), polyp(s), or                            other lesion(s) by snare technique                           43236, 61, Esophagogastroduodenoscopy, flexible,                            transoral; with directed submucosal injection(s),                            any substance                           31497, 58, Esophagogastroduodenoscopy, flexible,                            transoral; with biopsy, single or multiple Diagnosis Code(s):        --- Professional ---                           K22.8, Other specified diseases of esophagus                           K44.9, Diaphragmatic hernia without obstruction or                            gangrene                           K31.7, Polyp of stomach and duodenum                           R19.5, Other fecal abnormalities CPT copyright 2019 American Medical Association. All rights reserved. The codes documented in this  report are preliminary and upon coder review may  be revised to meet current compliance requirements. Remo Lipps P. Lanayah Gartley, MD 07/27/2021 11:06:33 AM This report has been signed electronically. Number of Addenda: 0

## 2021-07-27 NOTE — Progress Notes (Signed)
PT Cancellation Note  Patient Details Name: Alexis [name] MRN: [id number] DOB: 24-Jul-1951   Cancelled Treatment:    Reason Eval/Treat Not Completed: Patient at procedure or test/unavailable. Will continue to follow.   Shary Decamp [facility] 07/27/2021, 8:56 AM [place] Pager [phone] Office [phone]

## 2021-07-27 NOTE — Progress Notes (Signed)
PROGRESS NOTE    Alexis [name]  WLS:[id number] DOB: 1951-04-16 DOA: 07/24/2021 PCP: Deland Pretty, MD   Chief Complaint  Patient presents with   Shortness of Breath    Brief Narrative:   Alexis [name] is a 70 y.o. female with medical history significant of  CHF, A.fib DM2, Gastroparesis, GERD, HTN, HLD, OSA fatty liver disease, IBS, complaining of chest pain or sob. She was admitted for acute on chronic diastolic heart failure.  She was started in IV lasix with good diuresis. Her hemoglobin yesterday evening was around 9 and it has dropped to . 7.1 this am. She was given a unit of PRBC transfusion and repeat hemoglobin is around 10. She is initially scheduled to get a colonoscopy in January as outpatient. But with her drop in hemoglobin overnight, Cardiology recommendations, requested GI consult for further evaluation of GI bleed. Her stool for occult blood is positive. She is scheduled for EGD and colonoscopy.   Assessment & Plan:   Principal Problem:   CHF exacerbation ([place]) Active Problems:   DM2 (diabetes mellitus, type 2) ([place])   Hyperlipidemia   Essential hypertension   GERD   OSA (obstructive sleep apnea)   Obesity (BMI 30-39.9)   Type 2 diabetes mellitus ([place])   Diabetic peripheral neuropathy (HCC)   Paroxysmal atrial fibrillation (HCC)   Acute on chronic respiratory failure with hypoxia (HCC)   Acute on chronic diastolic CHF (congestive heart failure) (HCC)   Hypokalemia   Anemia   Heme positive stool   Chronic diarrhea   Anticoagulated   Benign neoplasm of colon   Gastric polyps  Acute on chronic diastolic heart failure: / Acute Respiratory failure with hypoxia on 2 lit of [place] oxygen.  Echocardiogram yesterday shows  Left ventricular ejection fraction, by estimation, is 65 to 70%. The  left ventricle has normal function.without any regional wall abnormalities. Left ventricular diastolic parameters are  consistent with Grade II diastolic dysfunction  (pseudonormalization).  She was initially started on IV lasix , transitioned to oral torsemide 20 mg daily.  Strict intake and output. Diuresed about 3 lit since admission.  Continue with spironolactone and Jardiance.    Type 2 DM with hyperglycemia CBG (last 3)  Recent Labs    07/27/21 0043 07/27/21 0447 07/27/21 1104  GLUCAP 146* 107* 122*    Resume SSI.  Resume semglee and SSI.    Hypertension;  BP parameters are optimal.    PAF Rate controlled with metoprolol.  On anticoagulation , which is on hold for GI bleed evaluation.    Hyperlipidemia:  - Lipitor.    Acute anemia of blood loss:  Hemoglobin drop to 7.1, S/p 1 unit of prbc transfusion. Recheck hemoglobin around 11. GI Consulted, underwent EGD Large gastric polyp in the cardia suspicious for heme positive stool in the setting of anticoagulation - very inflamed. Biopsied, results pending.  Colonoscopy done and results reviewed witht he patient.  Holding eliquis for 3 days.   Fall with right foot pain:  X rays show Mildly displaced proximal fifth metatarsal fracture. Pt is currently in a boot.  Plan for weight bearing on the right foot in the boot.   Hypokalemia Replaced.    Peripheral neuropathy from DM Resume home meds.   OSA  ON CPAP at night.    Body mass index is 30.93 kg/m. Obesity:    DVT prophylaxis: (scd's) Code Status: (Full Code) Family Communication: Family at bedside.  Disposition:   Status is: Inpatient  Remains inpatient appropriate  because: work up for anemia and acute diastolic heart failure.        Consultants:  Cardiology Gastroenterology.   Procedures: echo Lower extremity duplex X ray of the right foot.  Colonoscopy and EGD scheduled for tomorrow.  Antimicrobials:  none.   Subjective: Sob resolved. No chest pain.   Objective: Vitals:   07/27/21 0859 07/27/21 1050 07/27/21 1104 07/27/21 1135  BP: (!) 177/72 118/65 134/72 (!) 157/62  Pulse: 70 (!) 58  63 62  Resp: 20 16 15 16   Temp: (!) 97.2 F (36.2 C) 98 F (36.7 C) (!) 97 F (36.1 C)   TempSrc:    Oral  SpO2: 94% 97% 95% 95%  Weight:      Height:        Intake/Output Summary (Last 24 hours) at 07/27/2021 1424 Last data filed at 07/27/2021 1111 Gross per 24 hour  Intake 1350 ml  Output 160 ml  Net 1190 ml    Filed Weights   07/25/21 1219 07/26/21 0407 07/27/21 0443  Weight: 86.9 kg 83.8 kg 84.3 kg    Examination:  General exam: Appears calm and comfortable  Respiratory system: Clear to auscultation. Respiratory effort normal. Cardiovascular system: S1 & S2 heard, RRR. No JVD, murmurs, rubs, gallops or clicks. No pedal edema. Gastrointestinal system: Abdomen is nondistended, soft and nontender. Normal bowel sounds heard. Central nervous system: Alert and oriented. No focal neurological deficits. Extremities: Symmetric 5 x 5 power. Skin: No rashes, lesions or ulcers Psychiatry:  Mood & affect appropriate.      Data Reviewed: I have personally reviewed following labs and imaging studies  CBC: Recent Labs  Lab 07/24/21 2210 07/25/21 0237 07/25/21 1014 07/25/21 1100 07/25/21 1650 07/25/21 2259 07/26/21 1639 07/27/21 0119  WBC 8.0 6.4 7.9 7.5 7.6 7.9  --  7.8  NEUTROABS 5.9 4.5  --   --   --   --   --   --   HGB 9.1* 7.1* 10.5* 10.1* 10.2* 10.0* 10.4* 11.0*  HCT 28.7* 23.2* 32.5* 31.8* 31.7* 31.8* 32.4* 35.1*  MCV 82.7 84.4 82.3 82.6 81.5 82.4  --  83.6  PLT 217 200 238 237 255 255  --  295     Basic Metabolic Panel: Recent Labs  Lab 07/24/21 1638 07/24/21 1931 07/24/21 2210 07/25/21 1100 07/25/21 1650 07/26/21 0250 07/27/21 0623  NA  --   --  136 137 137 140 139  K  --   --  3.1* 3.2* 3.3* 3.8 5.0  CL  --   --  103 103 102 103 103  CO2  --   --  26 26 26 29 29   GLUCOSE  --   --  205* 135* 161* 103* 123*  BUN  --   --  18 13 14 12 10   CREATININE  --   --  0.90 0.93 0.80 0.88 0.65  CALCIUM  --   --  7.9* 8.0* 8.3* 8.2* 8.8*  MG 2.2  --   --   1.8  --   --   --   PHOS  --  2.3*  --  3.6  --   --   --      GFR: Estimated Creatinine Clearance: 71.1 mL/min (by C-G formula based on SCr of 0.65 mg/dL).  Liver Function Tests: Recent Labs  Lab 07/24/21 1931 07/25/21 1100  AST 23 34  ALT 28 41  ALKPHOS 62 88  BILITOT 0.8 1.2  PROT 4.5* 6.3*  ALBUMIN 1.8* 2.7*  CBG: Recent Labs  Lab 07/26/21 1610 07/26/21 2117 07/27/21 0043 07/27/21 0447 07/27/21 1104  GLUCAP 125* 157* 146* 107* 122*      Recent Results (from the past 240 hour(s))  Resp Panel by RT-PCR (Flu A&B, Covid) Nasopharyngeal Swab     Status: None   Collection Time: 07/24/21  2:38 PM   Specimen: Nasopharyngeal Swab; Nasopharyngeal(NP) swabs in vial transport medium  Result Value Ref Range Status   SARS Coronavirus 2 by RT PCR NEGATIVE NEGATIVE Final    Comment: (NOTE) SARS-CoV-2 target nucleic acids are NOT DETECTED.  The SARS-CoV-2 RNA is generally detectable in upper respiratory specimens during the acute phase of infection. The lowest concentration of SARS-CoV-2 viral copies this assay can detect is 138 copies/mL. A negative result does not preclude SARS-Cov-2 infection and should not be used as the sole basis for treatment or other patient management decisions. A negative result may occur with  improper specimen collection/handling, submission of specimen other than nasopharyngeal swab, presence of viral mutation(s) within the areas targeted by this assay, and inadequate number of viral copies(<138 copies/mL). A negative result must be combined with clinical observations, patient history, and epidemiological information. The expected result is Negative.  Fact Sheet for Patients:  EntrepreneurPulse.com.au  Fact Sheet for Healthcare Providers:  IncredibleEmployment.be  This test is no t yet approved or cleared by the [place] FDA and  has been authorized for detection and/or diagnosis of SARS-CoV-2  by FDA under an Emergency Use Authorization (EUA). This EUA will remain  in effect (meaning this test can be used) for the duration of the COVID-19 declaration under Section 564(b)(1) of the Act, 21 U.S.C.section 360bbb-3(b)(1), unless the authorization is terminated  or revoked sooner.       Influenza A by PCR NEGATIVE NEGATIVE Final   Influenza B by PCR NEGATIVE NEGATIVE Final    Comment: (NOTE) The Xpert Xpress SARS-CoV-2/FLU/RSV plus assay is intended as an aid in the diagnosis of influenza from Nasopharyngeal swab specimens and should not be used as a sole basis for treatment. Nasal washings and aspirates are unacceptable for Xpert Xpress SARS-CoV-2/FLU/RSV testing.  Fact Sheet for Patients: EntrepreneurPulse.com.au  Fact Sheet for Healthcare Providers: IncredibleEmployment.be  This test is not yet approved or cleared by the [place] FDA and has been authorized for detection and/or diagnosis of SARS-CoV-2 by FDA under an Emergency Use Authorization (EUA). This EUA will remain in effect (meaning this test can be used) for the duration of the COVID-19 declaration under Section 564(b)(1) of the Act, 21 U.S.C. section 360bbb-3(b)(1), unless the authorization is terminated or revoked.  Performed at [facility], [place] [address] [place], [place] [address]           Radiology Studies: DG Foot Complete Right  Result Date: 07/26/2021 CLINICAL DATA:  Right foot pain and swelling. EXAM: RIGHT FOOT COMPLETE - 3+ VIEW COMPARISON:  December 04, 2020. FINDINGS: Mildly displaced fracture is seen involving the proximal base of the fifth metatarsal. No other bony abnormality is noted. No soft tissue abnormality is noted. Joint spaces are intact. IMPRESSION: Mildly displaced proximal fifth metatarsal fracture. Electronically Signed   By: Marijo Conception M.D.   On: 07/26/2021 15:43   VAS [place] LOWER EXTREMITY VENOUS (DVT)  Result Date:  07/26/2021  Lower Venous DVT Study Patient Name:  ALLA SLOMA  Date of Exam:   07/26/2021 Medical Rec #: [id number]          Accession #:  [id number] Date of Birth: [birth year]          Patient Gender: F Patient Age:   29 years Exam Location:  [facility] Procedure:      VAS [place] LOWER EXTREMITY VENOUS (DVT) Referring Phys: Hosie Poisson --------------------------------------------------------------------------------  Indications: Swelling, recent fall.  Risk Factors: History of uterine cancer. Comparison Study: 07-27-2019 Lower extremity venous RT for swelling/tingling +1                   year negative for DVT. Performing Technologist: Darlin Coco RDMS, RVT  Examination Guidelines: A complete evaluation includes B-mode imaging, spectral Doppler, color Doppler, and power Doppler as needed of all accessible portions of each vessel. Bilateral testing is considered an integral part of a complete examination. Limited examinations for reoccurring indications may be performed as noted. The reflux portion of the exam is performed with the patient in reverse Trendelenburg.  +---------+---------------+---------+-----------+----------+--------------+  RIGHT     Compressibility Phasicity Spontaneity Properties Thrombus Aging  +---------+---------------+---------+-----------+----------+--------------+  CFV       Full            Yes       Yes                                    +---------+---------------+---------+-----------+----------+--------------+  SFJ       Full                                                             +---------+---------------+---------+-----------+----------+--------------+  FV Prox   Full                                                             +---------+---------------+---------+-----------+----------+--------------+  FV Mid    Full                                                             +---------+---------------+---------+-----------+----------+--------------+  FV Distal Full                                                              +---------+---------------+---------+-----------+----------+--------------+  PFV       Full                                                             +---------+---------------+---------+-----------+----------+--------------+  POP       Full            Yes  Yes                                    +---------+---------------+---------+-----------+----------+--------------+  PTV       Full                                                             +---------+---------------+---------+-----------+----------+--------------+  PERO      Full                                                             +---------+---------------+---------+-----------+----------+--------------+  Gastroc   Full                                                             +---------+---------------+---------+-----------+----------+--------------+   +---------+---------------+---------+-----------+----------+--------------+  LEFT      Compressibility Phasicity Spontaneity Properties Thrombus Aging  +---------+---------------+---------+-----------+----------+--------------+  CFV       Full            Yes       Yes                                    +---------+---------------+---------+-----------+----------+--------------+  SFJ       Full                                                             +---------+---------------+---------+-----------+----------+--------------+  FV Prox   Full                                                             +---------+---------------+---------+-----------+----------+--------------+  FV Mid    Full                                                             +---------+---------------+---------+-----------+----------+--------------+  FV Distal Full                                                             +---------+---------------+---------+-----------+----------+--------------+  PFV  Full                                                              +---------+---------------+---------+-----------+----------+--------------+  POP       Full            Yes       Yes                                    +---------+---------------+---------+-----------+----------+--------------+  PTV       Full                                                             +---------+---------------+---------+-----------+----------+--------------+  PERO      Full                                                             +---------+---------------+---------+-----------+----------+--------------+  Gastroc   Full                                                             +---------+---------------+---------+-----------+----------+--------------+     Summary: RIGHT: - There is no evidence of deep vein thrombosis in the lower extremity.  - No cystic structure found in the popliteal fossa.  - Ultrasound characteristics of enlarged lymph nodes are noted in the groin.  LEFT: - There is no evidence of deep vein thrombosis in the lower extremity.  - No cystic structure found in the popliteal fossa.  - Ultrasound characteristics of enlarged, echogenic lymph nodes noted in the groin.  *See table(s) above for measurements and observations. Electronically signed by Monica Martinez MD on 07/26/2021 at 7:56:44 PM.    Final         Scheduled Meds:  amiodarone  200 mg Oral Daily   atorvastatin  20 mg Oral Daily   empagliflozin  10 mg Oral Daily   insulin aspart  0-9 Units Subcutaneous Q4H   insulin glargine-yfgn  20 Units Subcutaneous QHS   irbesartan  300 mg Oral Daily   metoprolol tartrate  25 mg Oral BID   pregabalin  50 mg Oral BID   sodium chloride flush  3 mL Intravenous Q12H   spironolactone  25 mg Oral Daily   tamsulosin  0.4 mg Oral QHS   venlafaxine XR  150 mg Oral BID   Continuous Infusions:  sodium chloride       LOS: 3 days       Hosie Poisson, MD [facility]   To contact the attending provider between 7A-7P or the covering provider  during after hours 7P-7A, please log into the web site [email or web address] and access  using universal [place] password for that web site. If you do not have the password, please call the hospital operator.  07/27/2021, 2:24 PM

## 2021-07-27 NOTE — Transfer of Care (Signed)
Immediate Anesthesia Transfer of Care Note  Patient: Alexis [name]  Procedure(s) Performed: ESOPHAGOGASTRODUODENOSCOPY (EGD) WITH PROPOFOL COLONOSCOPY WITH PROPOFOL SCLEROTHERAPY BIOPSY POLYPECTOMY HEMOSTASIS CLIP PLACEMENT  Patient Location: PACU  Anesthesia Type:MAC  Level of Consciousness: drowsy  Airway & Oxygen Therapy: Patient Spontanous Breathing and Patient connected to nasal cannula oxygen  Post-op Assessment: Report given to RN and Post -op Vital signs reviewed and stable  Post vital signs: Reviewed and stable  Last Vitals:  Vitals Value Taken Time  BP 118/65 07/27/21 1049  Temp    Pulse 64 07/27/21 1053  Resp 18 07/27/21 1053  SpO2 99 % 07/27/21 1053  Vitals shown include unvalidated device data.  Last Pain:  Vitals:   07/27/21 0859  TempSrc:   PainSc: 0-No pain         Complications: No notable events documented.

## 2021-07-27 NOTE — Progress Notes (Signed)
PT Cancellation Note  Patient Details Name: TYMBER STALLINGS MRN: [id number] DOB: [birth year]   Cancelled Treatment:    Reason Eval/Treat Not Completed: Other (comment). Pt in endo this AM and now reports fatigue and ankle pain. Has been mobilizing well with mobility specialist. Will continue to follow with mobility specialist.   Shary Decamp [facility] 07/27/2021, 2:58 PM [place] Pager [phone] Office [phone]

## 2021-07-27 NOTE — Interval H&P Note (Signed)
History and Physical Interval Note: Patient feeling okay today. Tolerated prep. Denies shortness of breath or chest pain. EGD And colonoscopy scheduled to evaluate heme positive stool / anemia in the setting of anticoagulation in light of her chronic diarrhea and other symptoms. I have discussed risks / benefits of these exams with her and she wishes to proceed, further recommendations pending the results. No interval changes since I have seen her, exam unchanged / stable.   07/27/2021 9:22 AM  Alexis [name]  has presented today for surgery, with the diagnosis of anemia, heme positive stool.  The various methods of treatment have been discussed with the patient and family. After consideration of risks, benefits and other options for treatment, the patient has consented to  Procedure(s): ESOPHAGOGASTRODUODENOSCOPY (EGD) WITH PROPOFOL (N/A) COLONOSCOPY WITH PROPOFOL (N/A) as a surgical intervention.  The patient's history has been reviewed, patient examined, no change in status, stable for surgery.  I have reviewed the patient's chart and labs.  Questions were answered to the patient's satisfaction.     [place]

## 2021-07-27 NOTE — Anesthesia Postprocedure Evaluation (Signed)
Anesthesia Post Note  Patient: TYSHAY ADEE  Procedure(s) Performed: ESOPHAGOGASTRODUODENOSCOPY (EGD) WITH PROPOFOL COLONOSCOPY WITH PROPOFOL SCLEROTHERAPY BIOPSY POLYPECTOMY HEMOSTASIS CLIP PLACEMENT     Patient location during evaluation: Endoscopy Anesthesia Type: MAC Level of consciousness: awake and alert Pain management: pain level controlled Vital Signs Assessment: post-procedure vital signs reviewed and stable Respiratory status: spontaneous breathing, nonlabored ventilation and respiratory function stable Cardiovascular status: blood pressure returned to baseline and stable Postop Assessment: no apparent nausea or vomiting Anesthetic complications: no   No notable events documented.  Last Vitals:  Vitals:   07/27/21 1050 07/27/21 1104  BP: 118/65 134/72  Pulse: (!) 58 63  Resp: 16 15  Temp: 36.7 C (!) 36.1 C  SpO2: 97% 95%    Last Pain:  Vitals:   07/27/21 1104  TempSrc:   PainSc: 0-No pain                 Lidia Collum

## 2021-07-27 NOTE — Progress Notes (Signed)
Patient to room 3E27 from PACU. Vital signs obtained. On monitor. Reports no pain. Alert and oriented to room and call light. Call bell within reach.  Era Bumpers, RN

## 2021-07-27 NOTE — Progress Notes (Signed)
OT Cancellation Note  Patient Details Name: GARDENIA WITTER MRN: [id number] DOB: [birth year]   Cancelled Treatment:    Reason Eval/Treat Not Completed: Patient at procedure or test/ unavailable (Pt in cath lab.)  Malka So 07/27/2021, 8:56 AM Nestor Lewandowsky, OTR/L [place] Pager: [phone] Office: [phone]

## 2021-07-27 NOTE — Op Note (Signed)
[facility] Patient Name: Alexis [name] Procedure Date : 07/27/2021 MRN: [id number] Attending MD: Carlota Raspberry. Havery Moros , MD Date of Birth: [birth year] CSN: [id number] Age: 70 Admit Type: Inpatient Procedure:                Colonoscopy Indications:              Chronic diarrhea, Heme positive stool with anemia                            in setting of anticoagulation Providers:                Carlota Raspberry. Havery Moros, MD, Doristine Johns, RN,                            Lodema Hong Technician, Technician Referring MD:              Medicines:                Monitored Anesthesia Care Complications:            No immediate complications. Estimated blood loss:                            Minimal. Estimated Blood Loss:     Estimated blood loss was minimal. Procedure:                Pre-Anesthesia Assessment:                           - Prior to the procedure, a History and Physical                            was performed, and patient medications and                            allergies were reviewed. The patient's tolerance of                            previous anesthesia was also reviewed. The risks                            and benefits of the procedure and the sedation                            options and risks were discussed with the patient.                            All questions were answered, and informed consent                            was obtained. Prior Anticoagulants: The patient has                            taken Eliquis (apixaban), last dose was 3 days  prior to procedure. ASA Grade Assessment: III - A                            patient with severe systemic disease. After                            reviewing the risks and benefits, the patient was                            deemed in satisfactory condition to undergo the                            procedure.                           After obtaining informed consent, the  colonoscope                            was passed under direct vision. Throughout the                            procedure, the patient's blood pressure, pulse, and                            oxygen saturations were monitored continuously. The                            CF-HQ190L ([id number] Olympus colonoscope was                            introduced through the anus and advanced to the the                            terminal ileum, with identification of the                            appendiceal orifice and IC valve. The colonoscopy                            was performed without difficulty. The patient                            tolerated the procedure well. The quality of the                            bowel preparation was adequate. The terminal ileum,                            ileocecal valve, appendiceal orifice, and rectum                            were photographed. Scope In: 10:13:57 AM Scope Out: 10:42:33 AM Scope Withdrawal Time: 0 hours 23 minutes 20 seconds  Total Procedure Duration: 0 hours 28 minutes 36 seconds  Findings:      The perianal and digital rectal examinations were normal.      The terminal ileum appeared normal.      Two sessile polyps were found in the ascending colon. The polyps were 4       mm in size. These polyps were removed with a cold snare. Resection and       retrieval were complete.      A 5 mm polyp was found in the transverse colon. The polyp was sessile.       The polyp was removed with a cold snare. Resection and retrieval were       complete.      Two sessile polyps were found in the descending colon. The polyps were 3       to 6 mm in size. These polyps were removed with a cold snare. Resection       and retrieval were complete.      A 5 mm polyp was found in the sigmoid colon. The polyp was sessile. The       polyp was removed with a cold snare. Resection and retrieval were       complete.      Internal hemorrhoids were found during  retroflexion.      The exam was otherwise without abnormality. No inflammatory changes      Biopsies for histology were taken with a cold forceps from the right       colon, left colon and transverse colon for evaluation of microscopic       colitis. Impression:               - The examined portion of the ileum was normal.                           - Two 4 mm polyps in the ascending colon, removed                            with a cold snare. Resected and retrieved.                           - One 5 mm polyp in the transverse colon, removed                            with a cold snare. Resected and retrieved.                           - Two 3 to 6 mm polyps in the descending colon,                            removed with a cold snare. Resected and retrieved.                           - One 5 mm polyp in the sigmoid colon, removed with                            a cold snare. Resected and retrieved.                           -  Internal hemorrhoids.                           - The examination was otherwise normal.                           - Biopsies were taken with a cold forceps from the                            right colon, left colon and transverse colon for                            evaluation of microscopic colitis.                           No cause for heme positive stool on colonoscopy,                            which I suspect is due to gastric polyps. Recommendation:           - Return patient to hospital ward for ongoing care.                           - Clear liquid diet per EGD note.                           - Continue present medications.                           - Continue to hold Eliquis per EGD note                           - Await pathology results with further                            recommendations on management of chronic diarrhea Procedure Code(s):        --- Professional ---                           [phone], Colonoscopy, flexible; with removal of                             tumor(s), polyp(s), or other lesion(s) by snare                            technique                           [id number], 59, Colonoscopy, flexible; with biopsy,                            single or multiple Diagnosis Code(s):        --- Professional ---                           Q75.9, Other hemorrhoids  K63.5, Polyp of colon                           K52.9, Noninfective gastroenteritis and colitis,                            unspecified                           R19.5, Other fecal abnormalities CPT copyright 2019 [employer]. All rights reserved. The codes documented in this report are preliminary and upon coder review may  be revised to meet current compliance requirements. Remo Lipps P. Kaysen Deal, MD 07/27/2021 10:51:34 AM This report has been signed electronically. Number of Addenda: 0

## 2021-07-27 NOTE — Progress Notes (Signed)
Pt did not wish to wear CPAP tonight. Pt was informed to contact RT should she change her mind.

## 2021-07-27 NOTE — Progress Notes (Signed)
Cardiology Progress Note  Patient ID: Alexis [name] MRN: [id number] DOB: [birth year] Date of Encounter: 07/27/2021  Primary Cardiologist: Pixie Casino, MD  Subjective   Chief Complaint: None.   HPI: EGD and colonoscopy today.  Euvolemic on exam.  ROS:  All other ROS reviewed and negative. Pertinent positives noted in the HPI.     Inpatient Medications  Scheduled Meds:  [MAR Hold] sodium chloride   Intravenous Once   [MAR Hold] amiodarone  200 mg Oral Daily   [MAR Hold] atorvastatin  20 mg Oral Daily   [MAR Hold] empagliflozin  10 mg Oral Daily   [MAR Hold] insulin aspart  0-9 Units Subcutaneous Q4H   [MAR Hold] insulin glargine-yfgn  20 Units Subcutaneous QHS   [MAR Hold] irbesartan  300 mg Oral Daily   [MAR Hold] metoprolol tartrate  25 mg Oral BID   [MAR Hold] pregabalin  50 mg Oral BID   [MAR Hold] sodium chloride flush  3 mL Intravenous Q12H   [MAR Hold] spironolactone  25 mg Oral Daily   [MAR Hold] tamsulosin  0.4 mg Oral QHS   [MAR Hold] venlafaxine XR  150 mg Oral BID   Continuous Infusions:  [MAR Hold] sodium chloride     PRN Meds: [MAR Hold] sodium chloride, [MAR Hold] acetaminophen **OR** [MAR Hold] acetaminophen, [MAR Hold] ALPRAZolam, [MAR Hold] sodium chloride flush   Vital Signs   Vitals:   07/26/21 1612 07/26/21 2059 07/27/21 0443 07/27/21 0859  BP: (!) 143/59 (!) 151/63 (!) 141/61 (!) 177/72  Pulse: 65 66 66 70  Resp: 18 18 18 20   Temp: 98.3 F (36.8 C) 98 F (36.7 C) 98.1 F (36.7 C) (!) 97.2 F (36.2 C)  TempSrc: Oral Oral Oral   SpO2: 98% 100% 98% 94%  Weight:   84.3 kg   Height:        Intake/Output Summary (Last 24 hours) at 07/27/2021 0914 Last data filed at 07/26/2021 1900 Gross per 24 hour  Intake 840 ml  Output 150 ml  Net 690 ml   Last 3 Weights 07/27/2021 07/26/2021 07/25/2021  Weight (lbs) 185 lb 13.6 oz 184 lb 12.8 oz 191 lb 9.3 oz  Weight (kg) 84.3 kg 83.825 kg 86.9 kg      Telemetry  Overnight telemetry shows  sinus rhythm in the 60s, which I personally reviewed.    Physical Exam   Vitals:   07/26/21 1612 07/26/21 2059 07/27/21 0443 07/27/21 0859  BP: (!) 143/59 (!) 151/63 (!) 141/61 (!) 177/72  Pulse: 65 66 66 70  Resp: 18 18 18 20   Temp: 98.3 F (36.8 C) 98 F (36.7 C) 98.1 F (36.7 C) (!) 97.2 F (36.2 C)  TempSrc: Oral Oral Oral   SpO2: 98% 100% 98% 94%  Weight:   84.3 kg   Height:        Intake/Output Summary (Last 24 hours) at 07/27/2021 0914 Last data filed at 07/26/2021 1900 Gross per 24 hour  Intake 840 ml  Output 150 ml  Net 690 ml    Last 3 Weights 07/27/2021 07/26/2021 07/25/2021  Weight (lbs) 185 lb 13.6 oz 184 lb 12.8 oz 191 lb 9.3 oz  Weight (kg) 84.3 kg 83.825 kg 86.9 kg    Body mass index is 30.93 kg/m.  General: Well nourished, well developed, in no acute distress Head: Atraumatic, normal size  Eyes: PEERLA, EOMI  Neck: Supple, no JVD Endocrine: No thryomegaly Cardiac: Normal S1, S2; RRR; no murmurs, rubs, or gallops Lungs: Clear  to auscultation bilaterally, no wheezing, rhonchi or rales  Abd: Soft, nontender, no hepatomegaly  Ext: No edema, pulses 2+ Musculoskeletal: No deformities, BUE and BLE strength normal and equal Skin: Warm and dry, no rashes   Neuro: Alert and oriented to person, place, time, and situation, CNII-XII grossly intact, no focal deficits  Psych: Normal mood and affect   Labs  High Sensitivity Troponin:   Recent Labs  Lab 07/24/21 1638 07/24/21 1931  TROPONINIHS 29* 20*     Cardiac EnzymesNo results for input(s): TROPONINI in the last 168 hours. No results for input(s): TROPIPOC in the last 168 hours.  Chemistry Recent Labs  Lab 07/24/21 1931 07/24/21 2210 07/25/21 1100 07/25/21 1650 07/26/21 0250 07/27/21 0623  NA  --    < > 137 137 140 139  K  --    < > 3.2* 3.3* 3.8 5.0  CL  --    < > 103 102 103 103  CO2  --    < > 26 26 29 29   GLUCOSE  --    < > 135* 161* 103* 123*  BUN  --    < > 13 14 12 10   CREATININE  --     < > 0.93 0.80 0.88 0.65  CALCIUM  --    < > 8.0* 8.3* 8.2* 8.8*  PROT 4.5*  --  6.3*  --   --   --   ALBUMIN 1.8*  --  2.7*  --   --   --   AST 23  --  34  --   --   --   ALT 28  --  41  --   --   --   ALKPHOS 62  --  88  --   --   --   BILITOT 0.8  --  1.2  --   --   --   GFRNONAA  --    < > >60 >60 >60 >60  ANIONGAP  --    < > 8 9 8 7    < > = values in this interval not displayed.    Hematology Recent Labs  Lab 07/25/21 1650 07/25/21 2259 07/26/21 1639 07/27/21 0119  WBC 7.6 7.9  --  7.8  RBC 3.89 3.86*  --  4.20  HGB 10.2* 10.0* 10.4* 11.0*  HCT 31.7* 31.8* 32.4* 35.1*  MCV 81.5 82.4  --  83.6  MCH 26.2 25.9*  --  26.2  MCHC 32.2 31.4  --  31.3  RDW 17.2* 17.1*  --  17.3*  PLT 255 255  --  295   BNP Recent Labs  Lab 07/24/21 1355  BNP 868.1*    DDimer No results for input(s): DDIMER in the last 168 hours.   Radiology  DG Foot Complete Right  Result Date: 07/26/2021 CLINICAL DATA:  Right foot pain and swelling. EXAM: RIGHT FOOT COMPLETE - 3+ VIEW COMPARISON:  December 04, 2020. FINDINGS: Mildly displaced fracture is seen involving the proximal base of the fifth metatarsal. No other bony abnormality is noted. No soft tissue abnormality is noted. Joint spaces are intact. IMPRESSION: Mildly displaced proximal fifth metatarsal fracture. Electronically Signed   By: Marijo Conception M.D.   On: 07/26/2021 15:43   ECHOCARDIOGRAM COMPLETE  Result Date: 07/25/2021    ECHOCARDIOGRAM REPORT   Patient Name:   Alexis [name] Date of Exam: 07/25/2021 Medical Rec #:  [id number]         Height:  65.0 in Accession #:    [id number]        Weight:       180.0 lb Date of Birth:  1950/10/02         BSA:          1.892 m Patient Age:    61 years          BP:           111/91 mmHg Patient Gender: F                 HR:           63 bpm. Exam Location:  Inpatient Procedure: Cardiac Doppler, Color Doppler and Intracardiac Opacification Agent Indications:    congestive heart failure  History:         Patient has prior history of Echocardiogram examinations, most                 recent 05/02/2021. CHF, Previous Myocardial Infarction,                 Signs/Symptoms:Shortness of Breath and Syncope; Risk                 Factors:Diabetes, Hypertension and Dyslipidemia.  Sonographer:    Melissa Morford RDCS (AE, PE) Referring Phys: [place]  1. Left ventricular ejection fraction, by estimation, is 65 to 70%. The left ventricle has normal function. The left ventricle has no regional wall motion abnormalities. Left ventricular diastolic parameters are consistent with Grade II diastolic dysfunction (pseudonormalization).  2. Right ventricular systolic function is normal. The right ventricular size is normal.  3. The mitral valve is degenerative. No evidence of mitral valve regurgitation. No evidence of mitral stenosis. Moderate mitral annular calcification.  4. The aortic valve is tricuspid. Aortic valve regurgitation is not visualized. Aortic valve sclerosis is present, with no evidence of aortic valve stenosis.  5. The inferior vena cava is normal in size with <50% respiratory variability, suggesting right atrial pressure of 8 mmHg. Comparison(s): No significant change from prior study. FINDINGS  Left Ventricle: Left ventricular ejection fraction, by estimation, is 65 to 70%. The left ventricle has normal function. The left ventricle has no regional wall motion abnormalities. Definity contrast agent was given IV to delineate the left ventricular  endocardial borders. The left ventricular internal cavity size was normal in size. There is no left ventricular hypertrophy. Left ventricular diastolic parameters are consistent with Grade II diastolic dysfunction (pseudonormalization). Right Ventricle: The right ventricular size is normal. No increase in right ventricular wall thickness. Right ventricular systolic function is normal. The tricuspid regurgitant velocity is 2.81 m/s, and with an  assumed right atrial pressure of 8 mmHg, the estimated right ventricular systolic pressure is 34.7 mmHg. Left Atrium: Left atrial size was normal in size. Right Atrium: Right atrial size was normal in size. Pericardium: There is no evidence of pericardial effusion. Mitral Valve: The mitral valve is degenerative in appearance. Moderate mitral annular calcification. No evidence of mitral valve regurgitation. No evidence of mitral valve stenosis. Tricuspid Valve: The tricuspid valve is normal in structure. Tricuspid valve regurgitation is trivial. No evidence of tricuspid stenosis. Aortic Valve: The aortic valve is tricuspid. Aortic valve regurgitation is not visualized. Aortic valve sclerosis is present, with no evidence of aortic valve stenosis. Pulmonic Valve: The pulmonic valve was not well visualized. Pulmonic valve regurgitation is not visualized. No evidence of pulmonic stenosis. Aorta: The aortic root is normal in size and structure.  Venous: The inferior vena cava is normal in size with less than 50% respiratory variability, suggesting right atrial pressure of 8 mmHg. IAS/Shunts: The atrial septum is grossly normal.  LEFT VENTRICLE PLAX 2D LVIDd:         4.60 cm   Diastology LVIDs:         2.80 cm   LV e' medial:    4.68 cm/s LV PW:         1.00 cm   LV E/e' medial:  24.4 LV IVS:        1.00 cm   LV e' lateral:   4.79 cm/s LVOT diam:     1.90 cm   LV E/e' lateral: 23.8 LV SV:         80 LV SV Index:   42 LVOT Area:     2.84 cm  RIGHT VENTRICLE RV S prime:     14.50 cm/s TAPSE (M-mode): 2.2 cm LEFT ATRIUM             Index        RIGHT ATRIUM           Index LA diam:        4.50 cm 2.38 cm/m   RA Area:     16.90 cm LA Vol (A2C):   51.0 ml 26.96 ml/m  RA Volume:   48.70 ml  25.75 ml/m LA Vol (A4C):   45.4 ml 24.00 ml/m LA Biplane Vol: 48.1 ml 25.43 ml/m  AORTIC VALVE LVOT Vmax:   125.00 cm/s LVOT Vmean:  87.700 cm/s LVOT VTI:    0.283 m  AORTA Ao Root diam: 2.80 cm MITRAL VALVE                TRICUSPID  VALVE MV Area (PHT): 5.54 cm     TR Peak grad:   31.6 mmHg MV Decel Time: 137 msec     TR Vmax:        281.00 cm/s MV E velocity: 114.00 cm/s MV A velocity: 107.00 cm/s  SHUNTS MV E/A ratio:  1.07         Systemic VTI:  0.28 m                             Systemic Diam: 1.90 cm Alexis Haskell MD Electronically signed by Alexis Haskell MD Signature Date/Time: 07/25/2021/3:06:53 PM    Final    VAS [place] LOWER EXTREMITY VENOUS (DVT)  Result Date: 07/26/2021  Lower Venous DVT Study Patient Name:  Alexis [name]  Date of Exam:   07/26/2021 Medical Rec #: [id number]          Accession #:    [id number] Date of Birth: 1951-04-06          Patient Gender: F Patient Age:   40 years Exam Location:  [facility] Procedure:      VAS [place] LOWER EXTREMITY VENOUS (DVT) Referring Phys: Hosie Poisson --------------------------------------------------------------------------------  Indications: Swelling, recent fall.  Risk Factors: History of uterine cancer. Comparison Study: 07-27-2019 Lower extremity venous RT for swelling/tingling +1                   year negative for DVT. Performing Technologist: Darlin Coco RDMS, RVT  Examination Guidelines: A complete evaluation includes B-mode imaging, spectral Doppler, color Doppler, and power Doppler as needed of all accessible portions of each vessel. Bilateral testing is considered an integral part of a complete examination.  Limited examinations for reoccurring indications may be performed as noted. The reflux portion of the exam is performed with the patient in reverse Trendelenburg.  +---------+---------------+---------+-----------+----------+--------------+  RIGHT     Compressibility Phasicity Spontaneity Properties Thrombus Aging  +---------+---------------+---------+-----------+----------+--------------+  CFV       Full            Yes       Yes                                    +---------+---------------+---------+-----------+----------+--------------+  SFJ        Full                                                             +---------+---------------+---------+-----------+----------+--------------+  FV Prox   Full                                                             +---------+---------------+---------+-----------+----------+--------------+  FV Mid    Full                                                             +---------+---------------+---------+-----------+----------+--------------+  FV Distal Full                                                             +---------+---------------+---------+-----------+----------+--------------+  PFV       Full                                                             +---------+---------------+---------+-----------+----------+--------------+  POP       Full            Yes       Yes                                    +---------+---------------+---------+-----------+----------+--------------+  PTV       Full                                                             +---------+---------------+---------+-----------+----------+--------------+  PERO      Full                                                             +---------+---------------+---------+-----------+----------+--------------+  Gastroc   Full                                                             +---------+---------------+---------+-----------+----------+--------------+   +---------+---------------+---------+-----------+----------+--------------+  LEFT      Compressibility Phasicity Spontaneity Properties Thrombus Aging  +---------+---------------+---------+-----------+----------+--------------+  CFV       Full            Yes       Yes                                    +---------+---------------+---------+-----------+----------+--------------+  SFJ       Full                                                             +---------+---------------+---------+-----------+----------+--------------+  FV Prox   Full                                                              +---------+---------------+---------+-----------+----------+--------------+  FV Mid    Full                                                             +---------+---------------+---------+-----------+----------+--------------+  FV Distal Full                                                             +---------+---------------+---------+-----------+----------+--------------+  PFV       Full                                                             +---------+---------------+---------+-----------+----------+--------------+  POP       Full            Yes       Yes                                    +---------+---------------+---------+-----------+----------+--------------+  PTV       Full                                                             +---------+---------------+---------+-----------+----------+--------------+  PERO      Full                                                             +---------+---------------+---------+-----------+----------+--------------+  Gastroc   Full                                                             +---------+---------------+---------+-----------+----------+--------------+     Summary: RIGHT: - There is no evidence of deep vein thrombosis in the lower extremity.  - No cystic structure found in the popliteal fossa.  - Ultrasound characteristics of enlarged lymph nodes are noted in the groin.  LEFT: - There is no evidence of deep vein thrombosis in the lower extremity.  - No cystic structure found in the popliteal fossa.  - Ultrasound characteristics of enlarged, echogenic lymph nodes noted in the groin.  *See table(s) above for measurements and observations. Electronically signed by Monica Martinez MD on 07/26/2021 at 7:56:44 PM.    Final     Cardiac Studies  TTE 07/25/2021   1. Left ventricular ejection fraction, by estimation, is 65 to 70%. The  left ventricle has normal function. The left ventricle has no regional  wall motion  abnormalities. Left ventricular diastolic parameters are  consistent with Grade II diastolic  dysfunction (pseudonormalization).   2. Right ventricular systolic function is normal. The right ventricular  size is normal.   3. The mitral valve is degenerative. No evidence of mitral valve  regurgitation. No evidence of mitral stenosis. Moderate mitral annular  calcification.   4. The aortic valve is tricuspid. Aortic valve regurgitation is not  visualized. Aortic valve sclerosis is present, with no evidence of aortic  valve stenosis.   5. The inferior vena cava is normal in size with <50% respiratory  variability, suggesting right atrial pressure of 8 mmHg.   Patient Profile  EDELMIRA GALLOGLY is a 70 y.o. female with diastolic heart failure, nonobstructive CAD, paroxysmal atrial fibrillation on amiodarone, diabetes, hypertension, hyperlipidemia who was admitted on 07/24/2021 for acute hypoxic respiratory failure secondary to acute on chronic diastolic heart failure.  Assessment & Plan   #Acute on Chronic Diastolic HF #HTN -Euvolemic today.  Suspect dietary noncompliance was the reason she was admitted. -No further IV diuresis.  Transition to 20 mg of p.o. torsemide today. -Would continue Aldactone 25 mg daily at discharge. -She was counseled extensively on salt reduction. -Continue home BP meds  -jardiance 10 mg daily added   #Hypokalemia #Hypoalbuminemia #Diarrhea -GI following. Colonoscopy today with biospy.   #Paroxysmal Afib -in NSR on amiodarone  -restart AC per GI  #GI bleed -hold eliquis. Restart per GI  For questions or updates, please contact [place] Please consult [email or web address] for contact info under   Time Spent with Patient: I have spent a total of 25 minutes with patient reviewing hospital notes, telemetry, EKGs, labs and examining the patient as well as establishing an assessment and plan that was discussed with the patient.  > 50% of time was spent in  direct patient care.    Signed, Addison Naegeli. Audie Box, MD, [facility]   [facility]  07/27/2021 9:14 AM

## 2021-07-27 NOTE — Anesthesia Preprocedure Evaluation (Addendum)
Anesthesia Evaluation  Patient identified by MRN, date of birth, ID band Patient awake    Reviewed: Allergy & Precautions, NPO status , Patient's Chart, lab work & pertinent test results  History of Anesthesia Complications Negative for: history of anesthetic complications  Airway Mallampati: II  TM Distance: >3 FB Neck ROM: Full    Dental   Pulmonary sleep apnea ,    Pulmonary exam normal        Cardiovascular hypertension, +CHF  Normal cardiovascular exam+ dysrhythmias Atrial Fibrillation    Echo 07/25/21: 1. Left ventricular ejection fraction, by estimation, is 65 to 70%. The left ventricle has normal function. The left ventricle has no regional wall motion abnormalities. Left ventricular diastolic parameters are consistent with Grade II diastolic  dysfunction (pseudonormalization). 2. Right ventricular systolic function is normal. The right ventricular size is normal. 3. The mitral valve is degenerative. No evidence of mitral valve regurgitation. No evidence of mitral stenosis. Moderate mitral annular calcification. 4. The aortic valve is tricuspid. Aortic valve regurgitation is not visualized. Aortic valve sclerosis is present, with no evidence of aortic valve stenosis. 5. The inferior vena cava is normal in size with <50% respiratory variability, suggesting right atrial pressure of 8 mmHg.   Neuro/Psych  Headaches,    GI/Hepatic Neg liver ROS, hiatal hernia, GERD  ,  Endo/Other  diabetes, Type 2  Renal/GU negative Renal ROS  negative genitourinary   Musculoskeletal  (+) Arthritis , Fibromyalgia -  Abdominal   Peds  Hematology  (+) anemia ,   Anesthesia Other Findings   Reproductive/Obstetrics                             Anesthesia Physical Anesthesia Plan  ASA: 3  Anesthesia Plan: MAC   Post-op Pain Management: Minimal or no pain anticipated   Induction: Intravenous  PONV  Risk Score and Plan: 2 and Propofol infusion, TIVA and Treatment may vary due to age or medical condition  Airway Management Planned: Natural Airway, Nasal Cannula and Simple Face Mask  Additional Equipment: None  Intra-op Plan:   Post-operative Plan:   Informed Consent: I have reviewed the patients History and Physical, chart, labs and discussed the procedure including the risks, benefits and alternatives for the proposed anesthesia with the patient or authorized representative who has indicated his/her understanding and acceptance.       Plan Discussed with:   Anesthesia Plan Comments:         Anesthesia Quick Evaluation

## 2021-07-28 LAB — GLUCOSE, CAPILLARY
Glucose-Capillary: 80 mg/dL (ref 70–99)
Glucose-Capillary: 97 mg/dL (ref 70–99)
Glucose-Capillary: 209 mg/dL — ABNORMAL HIGH (ref 70–99)
Glucose-Capillary: 131 mg/dL — ABNORMAL HIGH (ref 70–99)
Glucose-Capillary: 192 mg/dL — ABNORMAL HIGH (ref 70–99)

## 2021-07-28 LAB — BASIC METABOLIC PANEL
Anion gap: 8 (ref 5–15)
BUN: 8 mg/dL (ref 8–23)
CO2: 27 mmol/L (ref 22–32)
Calcium: 8.8 mg/dL — ABNORMAL LOW (ref 8.9–10.3)
Chloride: 103 mmol/L (ref 98–111)
Creatinine, Ser: 0.69 mg/dL (ref 0.44–1.00)
GFR, Estimated: >60 mL/min (ref >60)
Glucose, Bld: 90 mg/dL (ref 70–99)
Potassium: 4.1 mmol/L (ref 3.5–5.1)
Sodium: 138 mmol/L (ref 135–145)

## 2021-07-28 LAB — HEMOGLOBIN AND HEMATOCRIT, BLOOD
HCT: 34 % — ABNORMAL LOW (ref 36.0–46.0)
Hemoglobin: 10.6 g/dL — ABNORMAL LOW (ref 12.0–15.0)

## 2021-07-28 MED ORDER — PANTOPRAZOLE SODIUM 40 MG PO TBEC
40.0000 mg | DELAYED_RELEASE_TABLET | Freq: Two times a day (BID) | ORAL | Status: DC
  Administered 2021-07-28 – 2021-07-29 (×3): 40 mg via ORAL
  Filled 2021-07-28 (×3): qty 1

## 2021-07-28 MED ORDER — INSULIN ASPART 100 UNIT/ML IJ SOLN: 0.0000 [IU] | Freq: Three times a day (TID) | INTRAMUSCULAR | Status: DC

## 2021-07-28 NOTE — Progress Notes (Signed)
Cardiology Progress Note  Patient ID: Alexis [name] MRN: [id number] DOB: [birth year] Date of Encounter: 07/28/2021  Primary Cardiologist: Pixie Casino, MD  Subjective   Polyps removed in interval, Eliquis held per GI.  Patient notes no complaints  Inpatient Medications  Scheduled Meds:  amiodarone  200 mg Oral Daily   atorvastatin  20 mg Oral Daily   empagliflozin  10 mg Oral Daily   insulin aspart  0-9 Units Subcutaneous Q4H   insulin glargine-yfgn  20 Units Subcutaneous QHS   irbesartan  300 mg Oral Daily   metoprolol tartrate  25 mg Oral BID   pregabalin  50 mg Oral BID   sodium chloride flush  3 mL Intravenous Q12H   spironolactone  25 mg Oral Daily   tamsulosin  0.4 mg Oral QHS   venlafaxine XR  150 mg Oral BID   Continuous Infusions:  sodium chloride     PRN Meds: sodium chloride, acetaminophen **OR** acetaminophen, ALPRAZolam, sodium chloride flush   Vital Signs   Vitals:   07/28/21 0503 07/28/21 0504 07/28/21 0835 07/28/21 1139  BP:   (!) 136/54 (!) 116/50  Pulse: 64 65 66 64  Resp:   18 18  Temp:   97.8 F (36.6 C) 97.8 F (36.6 C)  TempSrc:   Oral Oral  SpO2: 97% 97% 100% 94%  Weight:      Height:        Intake/Output Summary (Last 24 hours) at 07/28/2021 1143 Last data filed at 07/27/2021 1826 Gross per 24 hour  Intake 920 ml  Output --  Net 920 ml   Last 3 Weights 07/28/2021 07/27/2021 07/26/2021  Weight (lbs) 179 lb 3.2 oz 185 lb 13.6 oz 184 lb 12.8 oz  Weight (kg) 81.285 kg 84.3 kg 83.825 kg       Physical Exam   Vitals:   07/28/21 0503 07/28/21 0504 07/28/21 0835 07/28/21 1139  BP:   (!) 136/54 (!) 116/50  Pulse: 64 65 66 64  Resp:   18 18  Temp:   97.8 F (36.6 C) 97.8 F (36.6 C)  TempSrc:   Oral Oral  SpO2: 97% 97% 100% 94%  Weight:      Height:        Intake/Output Summary (Last 24 hours) at 07/28/2021 1143 Last data filed at 07/27/2021 1826 Gross per 24 hour  Intake 920 ml  Output --  Net 920 ml    Last 3  Weights 07/28/2021 07/27/2021 07/26/2021  Weight (lbs) 179 lb 3.2 oz 185 lb 13.6 oz 184 lb 12.8 oz  Weight (kg) 81.285 kg 84.3 kg 83.825 kg    Body mass index is 29.82 kg/m.   Gen: no distress,  Neck: No JVD Cardiac: No Rubs or Gallops, no Murmur, RRR Respiratory: Clear to auscultation bilaterally, normal effort, normal  respiratory rate GI: Soft, nontender, non-distended  MS: No  edema;  moves all extremities (R leg in boot) Integument: Skin feels warm Neuro:  At time of evaluation, alert and oriented to person/place/time/situation  Psych: Normal affect, patient feels well   Labs  High Sensitivity Troponin:   Recent Labs  Lab 07/24/21 1638 07/24/21 1931  TROPONINIHS 29* 20*     Cardiac EnzymesNo results for input(s): TROPONINI in the last 168 hours. No results for input(s): TROPIPOC in the last 168 hours.  Chemistry Recent Labs  Lab 07/24/21 1931 07/24/21 2210 07/25/21 1100 07/25/21 1650 07/26/21 0250 07/27/21 0623 07/28/21 0218  NA  --    < >  137   < > 140 139 138  K  --    < > 3.2*   < > 3.8 5.0 4.1  CL  --    < > 103   < > 103 103 103  CO2  --    < > 26   < > 29 29 27   GLUCOSE  --    < > 135*   < > 103* 123* 90  BUN  --    < > 13   < > 12 10 8   CREATININE  --    < > 0.93   < > 0.88 0.65 0.69  CALCIUM  --    < > 8.0*   < > 8.2* 8.8* 8.8*  PROT 4.5*  --  6.3*  --   --   --   --   ALBUMIN 1.8*  --  2.7*  --   --   --   --   AST 23  --  34  --   --   --   --   ALT 28  --  41  --   --   --   --   ALKPHOS 62  --  88  --   --   --   --   BILITOT 0.8  --  1.2  --   --   --   --   GFRNONAA  --    < > >60   < > >60 >60 >60  ANIONGAP  --    < > 8   < > 8 7 8    < > = values in this interval not displayed.    Hematology Recent Labs  Lab 07/25/21 1650 07/25/21 2259 07/26/21 1639 07/27/21 0119  WBC 7.6 7.9  --  7.8  RBC 3.89 3.86*  --  4.20  HGB 10.2* 10.0* 10.4* 11.0*  HCT 31.7* 31.8* 32.4* 35.1*  MCV 81.5 82.4  --  83.6  MCH 26.2 25.9*  --  26.2  MCHC 32.2  31.4  --  31.3  RDW 17.2* 17.1*  --  17.3*  PLT 255 255  --  295   BNP Recent Labs  Lab 07/24/21 1355  BNP 868.1*    DDimer No results for input(s): DDIMER in the last 168 hours.   Radiology  DG Foot Complete Right  Result Date: 07/26/2021 CLINICAL DATA:  Right foot pain and swelling. EXAM: RIGHT FOOT COMPLETE - 3+ VIEW COMPARISON:  December 04, 2020. FINDINGS: Mildly displaced fracture is seen involving the proximal base of the fifth metatarsal. No other bony abnormality is noted. No soft tissue abnormality is noted. Joint spaces are intact. IMPRESSION: Mildly displaced proximal fifth metatarsal fracture. Electronically Signed   By: Marijo Conception M.D.   On: 07/26/2021 15:43   VAS [place] LOWER EXTREMITY VENOUS (DVT)  Result Date: 07/26/2021  Lower Venous DVT Study Patient Name:  Alexis [name]  Date of Exam:   07/26/2021 Medical Rec #: [id number]          Accession #:    [id number] Date of Birth: [birth year]          Patient Gender: F Patient Age:   70 years Exam Location:  [facility] Procedure:      VAS [place] LOWER EXTREMITY VENOUS (DVT) Referring Phys: Hosie Poisson --------------------------------------------------------------------------------  Indications: Swelling, recent fall.  Risk Factors: History of uterine cancer. Comparison Study: 07-27-2019 Lower extremity venous RT for swelling/tingling +1  year negative for DVT. Performing Technologist: Darlin Coco RDMS, RVT  Examination Guidelines: A complete evaluation includes B-mode imaging, spectral Doppler, color Doppler, and power Doppler as needed of all accessible portions of each vessel. Bilateral testing is considered an integral part of a complete examination. Limited examinations for reoccurring indications may be performed as noted. The reflux portion of the exam is performed with the patient in reverse Trendelenburg.  +---------+---------------+---------+-----------+----------+--------------+  RIGHT      Compressibility Phasicity Spontaneity Properties Thrombus Aging  +---------+---------------+---------+-----------+----------+--------------+  CFV       Full            Yes       Yes                                    +---------+---------------+---------+-----------+----------+--------------+  SFJ       Full                                                             +---------+---------------+---------+-----------+----------+--------------+  FV Prox   Full                                                             +---------+---------------+---------+-----------+----------+--------------+  FV Mid    Full                                                             +---------+---------------+---------+-----------+----------+--------------+  FV Distal Full                                                             +---------+---------------+---------+-----------+----------+--------------+  PFV       Full                                                             +---------+---------------+---------+-----------+----------+--------------+  POP       Full            Yes       Yes                                    +---------+---------------+---------+-----------+----------+--------------+  PTV       Full                                                             +---------+---------------+---------+-----------+----------+--------------+  PERO      Full                                                             +---------+---------------+---------+-----------+----------+--------------+  Gastroc   Full                                                             +---------+---------------+---------+-----------+----------+--------------+   +---------+---------------+---------+-----------+----------+--------------+  LEFT      Compressibility Phasicity Spontaneity Properties Thrombus Aging  +---------+---------------+---------+-----------+----------+--------------+  CFV       Full            Yes       Yes                                     +---------+---------------+---------+-----------+----------+--------------+  SFJ       Full                                                             +---------+---------------+---------+-----------+----------+--------------+  FV Prox   Full                                                             +---------+---------------+---------+-----------+----------+--------------+  FV Mid    Full                                                             +---------+---------------+---------+-----------+----------+--------------+  FV Distal Full                                                             +---------+---------------+---------+-----------+----------+--------------+  PFV       Full                                                             +---------+---------------+---------+-----------+----------+--------------+  POP       Full            Yes       Yes                                    +---------+---------------+---------+-----------+----------+--------------+  PTV       Full                                                             +---------+---------------+---------+-----------+----------+--------------+  PERO      Full                                                             +---------+---------------+---------+-----------+----------+--------------+  Gastroc   Full                                                             +---------+---------------+---------+-----------+----------+--------------+     Summary: RIGHT: - There is no evidence of deep vein thrombosis in the lower extremity.  - No cystic structure found in the popliteal fossa.  - Ultrasound characteristics of enlarged lymph nodes are noted in the groin.  LEFT: - There is no evidence of deep vein thrombosis in the lower extremity.  - No cystic structure found in the popliteal fossa.  - Ultrasound characteristics of enlarged, echogenic lymph nodes noted in the groin.  *See table(s) above for measurements and  observations. Electronically signed by Monica Martinez MD on 07/26/2021 at 7:56:44 PM.    Final     Cardiac Studies  TTE 07/25/2021   1. Left ventricular ejection fraction, by estimation, is 65 to 70%. The  left ventricle has normal function. The left ventricle has no regional  wall motion abnormalities. Left ventricular diastolic parameters are  consistent with Grade II diastolic  dysfunction (pseudonormalization).   2. Right ventricular systolic function is normal. The right ventricular  size is normal.   3. The mitral valve is degenerative. No evidence of mitral valve  regurgitation. No evidence of mitral stenosis. Moderate mitral annular  calcification.   4. The aortic valve is tricuspid. Aortic valve regurgitation is not  visualized. Aortic valve sclerosis is present, with no evidence of aortic  valve stenosis.   5. The inferior vena cava is normal in size with <50% respiratory  variability, suggesting right atrial pressure of 8 mmHg.   Patient Profile   Alexis [name] is a 70 y.o. female with diastolic heart failure, nonobstructive CAD, paroxysmal atrial fibrillation on amiodarone, diabetes, hypertension, hyperlipidemia who was admitted on 07/24/2021 for acute hypoxic respiratory failure secondary to acute on chronic diastolic heart failure.  Assessment & Plan   #Acute on Chronic HFpEF #HTN -Euvolemic- weaning of O2, BNP for 07/29/21 - on 20 mg PO torsemide daily -Aldactone 25 mg daily at discharge. -jardiance 10 mg daily  #Paroxysmal Afib - GI bleed with polys and biopsy -in NSR on amiodarone  -restart AC per GI (presently still holding)  #Hypokalemia #Hypoalbuminemia #Diarrhea - K goal of 4 (at goal today) - rest per primary- GI  Potential 07/29/21 DC  For questions or updates, please contact [facility] Please consult [email or web address] for contact info under   Rudean Haskell, MD Cardiologist [facility]   [facility]  [address]  [address] [place],  [address] [phone]  11:46 AM

## 2021-07-28 NOTE — Progress Notes (Signed)
PROGRESS NOTE    Alexis [name]  ONG:[id number] DOB: 21-May-1951 DOA: 07/24/2021 PCP: Deland Pretty, MD   Chief Complaint  Patient presents with   Shortness of Breath    Brief Narrative:   Alexis [name] is a 70 y.o. female with medical history significant of  CHF, A.fib DM2, Gastroparesis, GERD, HTN, HLD, OSA fatty liver disease, IBS, complaining of chest pain or sob. She was admitted for acute on chronic diastolic heart failure.  She was started in IV lasix with good diuresis. Her hemoglobin yesterday evening was around 9 and it has dropped to . 7.1 this am. She was given a unit of PRBC transfusion and repeat hemoglobin is around 10. She is initially scheduled to get a colonoscopy in January as outpatient. But with her drop in hemoglobin overnight, Cardiology recommendations, requested GI consult for further evaluation of GI bleed. Her stool for occult blood is positive. She is scheduled for EGD and colonoscopy.    Assessment & Plan:   Principal Problem:   CHF exacerbation ([place]) Active Problems:   DM2 (diabetes mellitus, type 2) ([place])   Hyperlipidemia   Essential hypertension   GERD   OSA (obstructive sleep apnea)   Obesity (BMI 30-39.9)   Type 2 diabetes mellitus ([place])   Diabetic peripheral neuropathy (HCC)   Paroxysmal atrial fibrillation (HCC)   Acute on chronic respiratory failure with hypoxia (HCC)   Acute on chronic diastolic CHF (congestive heart failure) (HCC)   Hypokalemia   Anemia   Heme positive stool   Chronic diarrhea   Anticoagulated   Benign neoplasm of colon   Gastric polyps  Acute on chronic diastolic heart failure: / Acute Respiratory failure with hypoxia on 2 lit of [place] oxygen.  Echocardiogram yesterday shows  Left ventricular ejection fraction, by estimation, is 65 to 70%. The  left ventricle has normal function.without any regional wall abnormalities. Left ventricular diastolic parameters are  consistent with Grade II diastolic dysfunction  (pseudonormalization).  She was initially started on IV lasix , transitioned to oral torsemide 20 mg daily. Wean her off oxygen today.  Strict intake and output. Continue with spironolactone and Jardiance on discharge. Appreciate cardiology recommendations.     Type 2 DM with hyperglycemia CBG (last 3)  Recent Labs    07/27/21 2003 07/28/21 0556 07/28/21 0801  GLUCAP 142* 80 97    Resume SSI.  Resume semglee and SSI.  No changes in meds.    Hypertension;  BP parameters are well controlled.   PAF Rate controlled with metoprolol.  On anticoagulation , which is on hold for GI bleed evaluation.    Hyperlipidemia:  - Lipitor.    Acute anemia of blood loss:  Hemoglobin drop to 7.1, S/p 1 unit of prbc transfusion. Recheck hemoglobin around 11. GI Consulted, underwent EGD Large gastric polyp in the cardia suspicious for heme positive stool in the setting of anticoagulation - very inflamed. Biopsied, results pending.  Colonoscopy done and results reviewed witht he patient.  Holding eliquis for 3 days. Check H&H later today.   Fall with right foot pain:  X rays show Mildly displaced proximal fifth metatarsal fracture. Pt is currently in a boot.  Plan for weight bearing on the right foot in the boot. Discussed with orthopedics.   Hypokalemia Replaced. Repeat level wnl.    Peripheral neuropathy from DM Resume home meds.   OSA  ON CPAP at night.    Body mass index is 29.82 kg/m. Obesity:    DVT prophylaxis: (scd's)  Code Status: (Full Code) Family Communication: Family at bedside.  Disposition:   Status is: Inpatient  Remains inpatient appropriate because: work up for anemia and acute diastolic heart failure.        Consultants:  Cardiology Gastroenterology.   Procedures: echo Lower extremity duplex negative for DVT.  X ray of the right foot. Displaced 5 th metatarsal fracture.  Colonoscopy Two 4 mm polyps in the ascending colon, removed with a  cold snare. Resected and retrieved.One 5 mm polyp in the transverse colon, removed with a cold snare. Resected and retrieved. Two 3 to 6 mm polyps in the descending colon, removed with a cold snare. Resected and retrieved. One 5 mm polyp in the sigmoid colon, removed with a cold snare. Resected and retrieved. Internal hemorrhoids.  EGD:  Small hiatal hernia, Large gastric polyp in the cardia,  suspect is the cause of heme positive stool in the setting of anticoagulation - very inflamed. Smaller polypoid lesion in this area could not be reached to be removed. Clips were placed prophylactically given need to resume anticoagulation in the future, however this lesion is at risk for rebleeding, defect was not able to be completely closed. - A single gastric polyp in the antrum. Resected and retrieved.  Antimicrobials:  none.   Subjective: Feel better, no chest painand breathing has improved.  1 bm this morning.   Objective: Vitals:   07/28/21 0502 07/28/21 0503 07/28/21 0504 07/28/21 0835  BP:    (!) 136/54  Pulse: 65 64 65 66  Resp: 15   18  Temp: 99.7 F (37.6 C)   97.8 F (36.6 C)  TempSrc: Oral   Oral  SpO2:  97% 97% 100%  Weight:      Height:        Intake/Output Summary (Last 24 hours) at 07/28/2021 1122 Last data filed at 07/27/2021 1826 Gross per 24 hour  Intake 920 ml  Output --  Net 920 ml    Filed Weights   07/26/21 0407 07/27/21 0443 07/28/21 0027  Weight: 83.8 kg 84.3 kg 81.3 kg    Examination:  General exam: Appears calm and comfortable  Respiratory system: Clear to auscultation. Respiratory effort normal. Cardiovascular system: S1 & S2 heard, RRR. No JVD, pedal edema has improved Gastrointestinal system: Abdomen is nondistended, soft and nontender. Normal bowel sounds heard. Central nervous system: Alert and oriented. No focal neurological deficits. Extremities: Symmetric 5 x 5 power. Skin: No rashes, lesions or ulcers Psychiatry: Mood & affect appropriate.       Data Reviewed: I have personally reviewed following labs and imaging studies  CBC: Recent Labs  Lab 07/24/21 2210 07/25/21 0237 07/25/21 1014 07/25/21 1100 07/25/21 1650 07/25/21 2259 07/26/21 1639 07/27/21 0119  WBC 8.0 6.4 7.9 7.5 7.6 7.9  --  7.8  NEUTROABS 5.9 4.5  --   --   --   --   --   --   HGB 9.1* 7.1* 10.5* 10.1* 10.2* 10.0* 10.4* 11.0*  HCT 28.7* 23.2* 32.5* 31.8* 31.7* 31.8* 32.4* 35.1*  MCV 82.7 84.4 82.3 82.6 81.5 82.4  --  83.6  PLT 217 200 238 237 255 255  --  295     Basic Metabolic Panel: Recent Labs  Lab 07/24/21 1638 07/24/21 1931 07/24/21 2210 07/25/21 1100 07/25/21 1650 07/26/21 0250 07/27/21 0623 07/28/21 0218  NA  --   --    < > 137 137 140 139 138  K  --   --    < >  3.2* 3.3* 3.8 5.0 4.1  CL  --   --    < > 103 102 103 103 103  CO2  --   --    < > 26 26 29 29 27   GLUCOSE  --   --    < > 135* 161* 103* 123* 90  BUN  --   --    < > 13 14 12 10 8   CREATININE  --   --    < > 0.93 0.80 0.88 0.65 0.69  CALCIUM  --   --    < > 8.0* 8.3* 8.2* 8.8* 8.8*  MG 2.2  --   --  1.8  --   --   --   --   PHOS  --  2.3*  --  3.6  --   --   --   --    < > = values in this interval not displayed.     GFR: Estimated Creatinine Clearance: 69.9 mL/min (by C-G formula based on SCr of 0.69 mg/dL).  Liver Function Tests: Recent Labs  Lab 07/24/21 1931 07/25/21 1100  AST 23 34  ALT 28 41  ALKPHOS 62 88  BILITOT 0.8 1.2  PROT 4.5* 6.3*  ALBUMIN 1.8* 2.7*     CBG: Recent Labs  Lab 07/27/21 1104 07/27/21 1555 07/27/21 2003 07/28/21 0556 07/28/21 0801  GLUCAP 122* 137* 142* 80 97      Recent Results (from the past 240 hour(s))  Resp Panel by RT-PCR (Flu A&B, Covid) Nasopharyngeal Swab     Status: None   Collection Time: 07/24/21  2:38 PM   Specimen: Nasopharyngeal Swab; Nasopharyngeal(NP) swabs in vial transport medium  Result Value Ref Range Status   SARS Coronavirus 2 by RT PCR NEGATIVE NEGATIVE Final    Comment:  (NOTE) SARS-CoV-2 target nucleic acids are NOT DETECTED.  The SARS-CoV-2 RNA is generally detectable in upper respiratory specimens during the acute phase of infection. The lowest concentration of SARS-CoV-2 viral copies this assay can detect is 138 copies/mL. A negative result does not preclude SARS-Cov-2 infection and should not be used as the sole basis for treatment or other patient management decisions. A negative result may occur with  improper specimen collection/handling, submission of specimen other than nasopharyngeal swab, presence of viral mutation(s) within the areas targeted by this assay, and inadequate number of viral copies(<138 copies/mL). A negative result must be combined with clinical observations, patient history, and epidemiological information. The expected result is Negative.  Fact Sheet for Patients:  EntrepreneurPulse.com.au  Fact Sheet for Healthcare Providers:  IncredibleEmployment.be  This test is no t yet approved or cleared by the [place] FDA and  has been authorized for detection and/or diagnosis of SARS-CoV-2 by FDA under an Emergency Use Authorization (EUA). This EUA will remain  in effect (meaning this test can be used) for the duration of the COVID-19 declaration under Section 564(b)(1) of the Act, 21 U.S.C.section 360bbb-3(b)(1), unless the authorization is terminated  or revoked sooner.       Influenza A by PCR NEGATIVE NEGATIVE Final   Influenza B by PCR NEGATIVE NEGATIVE Final    Comment: (NOTE) The Xpert Xpress SARS-CoV-2/FLU/RSV plus assay is intended as an aid in the diagnosis of influenza from Nasopharyngeal swab specimens and should not be used as a sole basis for treatment. Nasal washings and aspirates are unacceptable for Xpert Xpress SARS-CoV-2/FLU/RSV testing.  Fact Sheet for Patients: EntrepreneurPulse.com.au  Fact Sheet for Healthcare  Providers: IncredibleEmployment.be  This test is not yet approved or cleared by the [place] and has been authorized for detection and/or diagnosis of SARS-CoV-2 by FDA under an Emergency Use Authorization (EUA). This EUA will remain in effect (meaning this test can be used) for the duration of the COVID-19 declaration under Section 564(b)(1) of the Act, 21 U.S.C. section 360bbb-3(b)(1), unless the authorization is terminated or revoked.  Performed at [facility], [place] [address], [place], [place] [address]           Radiology Studies: DG Foot Complete Right  Result Date: 07/26/2021 CLINICAL DATA:  Right foot pain and swelling. EXAM: RIGHT FOOT COMPLETE - 3+ VIEW COMPARISON:  December 04, 2020. FINDINGS: Mildly displaced fracture is seen involving the proximal base of the fifth metatarsal. No other bony abnormality is noted. No soft tissue abnormality is noted. Joint spaces are intact. IMPRESSION: Mildly displaced proximal fifth metatarsal fracture. Electronically Signed   By: Marijo Conception M.D.   On: 07/26/2021 15:43   VAS [place] LOWER EXTREMITY VENOUS (DVT)  Result Date: 07/26/2021  Lower Venous DVT Study Patient Name:  MARNIE FAZZINO  Date of Exam:   07/26/2021 Medical Rec #: [id number]          Accession #:    [id number] Date of Birth: 1951/03/03          Patient Gender: F Patient Age:   64 years Exam Location:  [facility] Procedure:      VAS [place] LOWER EXTREMITY VENOUS (DVT) Referring Phys: Hosie Poisson --------------------------------------------------------------------------------  Indications: Swelling, recent fall.  Risk Factors: History of uterine cancer. Comparison Study: 07-27-2019 Lower extremity venous RT for swelling/tingling +1                   year negative for DVT. Performing Technologist: Darlin Coco RDMS, RVT  Examination Guidelines: A complete evaluation includes B-mode imaging, spectral Doppler, color Doppler, and power Doppler  as needed of all accessible portions of each vessel. Bilateral testing is considered an integral part of a complete examination. Limited examinations for reoccurring indications may be performed as noted. The reflux portion of the exam is performed with the patient in reverse Trendelenburg.  +---------+---------------+---------+-----------+----------+--------------+  RIGHT     Compressibility Phasicity Spontaneity Properties Thrombus Aging  +---------+---------------+---------+-----------+----------+--------------+  CFV       Full            Yes       Yes                                    +---------+---------------+---------+-----------+----------+--------------+  SFJ       Full                                                             +---------+---------------+---------+-----------+----------+--------------+  FV Prox   Full                                                             +---------+---------------+---------+-----------+----------+--------------+  FV Mid  Full                                                             +---------+---------------+---------+-----------+----------+--------------+  FV Distal Full                                                             +---------+---------------+---------+-----------+----------+--------------+  PFV       Full                                                             +---------+---------------+---------+-----------+----------+--------------+  POP       Full            Yes       Yes                                    +---------+---------------+---------+-----------+----------+--------------+  PTV       Full                                                             +---------+---------------+---------+-----------+----------+--------------+  PERO      Full                                                             +---------+---------------+---------+-----------+----------+--------------+  Gastroc   Full                                                              +---------+---------------+---------+-----------+----------+--------------+   +---------+---------------+---------+-----------+----------+--------------+  LEFT      Compressibility Phasicity Spontaneity Properties Thrombus Aging  +---------+---------------+---------+-----------+----------+--------------+  CFV       Full            Yes       Yes                                    +---------+---------------+---------+-----------+----------+--------------+  SFJ       Full                                                             +---------+---------------+---------+-----------+----------+--------------+  FV Prox   Full                                                             +---------+---------------+---------+-----------+----------+--------------+  FV Mid    Full                                                             +---------+---------------+---------+-----------+----------+--------------+  FV Distal Full                                                             +---------+---------------+---------+-----------+----------+--------------+  PFV       Full                                                             +---------+---------------+---------+-----------+----------+--------------+  POP       Full            Yes       Yes                                    +---------+---------------+---------+-----------+----------+--------------+  PTV       Full                                                             +---------+---------------+---------+-----------+----------+--------------+  PERO      Full                                                             +---------+---------------+---------+-----------+----------+--------------+  Gastroc   Full                                                             +---------+---------------+---------+-----------+----------+--------------+     Summary: RIGHT: - There is no evidence of deep vein thrombosis in the lower extremity.  - No cystic  structure found in the popliteal fossa.  - Ultrasound characteristics of enlarged lymph nodes are noted in the groin.  LEFT: - There is no evidence of deep vein thrombosis in the lower extremity.  - No cystic  structure found in the popliteal fossa.  - Ultrasound characteristics of enlarged, echogenic lymph nodes noted in the groin.  *See table(s) above for measurements and observations. Electronically signed by Monica Martinez MD on 07/26/2021 at 7:56:44 PM.    Final         Scheduled Meds:  amiodarone  200 mg Oral Daily   atorvastatin  20 mg Oral Daily   empagliflozin  10 mg Oral Daily   insulin aspart  0-9 Units Subcutaneous Q4H   insulin glargine-yfgn  20 Units Subcutaneous QHS   irbesartan  300 mg Oral Daily   metoprolol tartrate  25 mg Oral BID   pregabalin  50 mg Oral BID   sodium chloride flush  3 mL Intravenous Q12H   spironolactone  25 mg Oral Daily   tamsulosin  0.4 mg Oral QHS   venlafaxine XR  150 mg Oral BID   Continuous Infusions:  sodium chloride       LOS: 4 days       Hosie Poisson, MD [facility]   To contact the attending provider between 7A-7P or the covering provider during after hours 7P-7A, please log into the web site [email or web address] and access using universal [place] password for that web site. If you do not have the password, please call the hospital operator.  07/28/2021, 11:22 AM

## 2021-07-28 NOTE — Progress Notes (Signed)
Mobility Specialist Progress Note:   07/28/21 1200  Mobility  Level of Assistance Contact guard assist, steadying assist  Assistive Device  (HHA)  Distance Ambulated (ft) 500 ft  Mobility Ambulated with assistance in hallway  Mobility Response Tolerated well  Mobility performed by Mobility specialist  Bed Position Chair  $Mobility charge 1 Mobility   Pt received in chair willing to participate in mobility. No complaints of pain. Pt left in chair with call bell in reach and all needs met.   [facility] Public librarian Phone [phone] Secondary Phone [phone]

## 2021-07-28 NOTE — Progress Notes (Addendum)
° ° ° °   Progress Note   Subjective  Patient doing well. Denies any complaints of bleeding, etc. Tolerating soft diet this AM. Eliquis on hold. No CBC done today. She inquires about when she can go home, her preference would be tomorrow.   Objective   Vital signs in last 24 hours: Temp:  [97.8 F (36.6 C)-99.7 F (37.6 C)] 97.8 F (36.6 C) (12/17 1139) Pulse Rate:  [62-71] 64 (12/17 1139) Resp:  [15-18] 18 (12/17 1139) BP: (116-144)/(50-55) 116/50 (12/17 1139) SpO2:  [92 %-100 %] 94 % (12/17 1139) Weight:  [81.3 kg] 81.3 kg (12/17 0027) Last BM Date: 07/28/21 General:    white female in NAD Neurologic:  Alert and oriented,  grossly normal neurologically. Psych:  Cooperative. Normal mood and affect.  Intake/Output from previous day: 12/16 0701 - 12/17 0700 In: 1430 [P.O.:920; I.V.:500] Out: 10 [Blood:10] Intake/Output this shift: No intake/output data recorded.  Lab Results: Recent Labs    07/25/21 1650 07/25/21 2259 07/26/21 1639 07/27/21 0119  WBC 7.6 7.9  --  7.8  HGB 10.2* 10.0* 10.4* 11.0*  HCT 31.7* 31.8* 32.4* 35.1*  PLT 255 255  --  295   BMET Recent Labs    07/26/21 0250 07/27/21 0623 07/28/21 0218  NA 140 139 138  K 3.8 5.0 4.1  CL 103 103 103  CO2 29 29 27   GLUCOSE 103* 123* 90  BUN 12 10 8   CREATININE 0.88 0.65 0.69  CALCIUM 8.2* 8.8* 8.8*   LFT No results for input(s): PROT, ALBUMIN, AST, ALT, ALKPHOS, BILITOT, BILIDIR, IBILI in the last 72 hours. PT/INR No results for input(s): LABPROT, INR in the last 72 hours.  Studies/Results:     Assessment / Plan:    70 y/o female admitted with CHF noted to have worsening anemia with heme positive stools in the setting of Eliquis. Also with loose stools and multiple upper tract symptoms. EGD and colonoscopy done yesterday. Remarkable findings include 6 polyps which were removed and no inflammatory changes of her colon. Random biopsies were taken. EGD showed a large benign appearing inflamed polyp in  the gastric cardia which was technically challenging to remove - I think this may be the cause of her heme positive stool. She had another inflamed gastric polyp also in the distal stomach which was removed and biopsies of stomach / small bowel also taken and pending. Hemostasis clips applied to largest polypectomy site to reduce risk for post polypectomy bleeding but quite a difficult position to place them in. She is at risk for rebleeding especially in the setting of resuming anticoagulation. Doing well post procedure, Hgb not done yet.  Recommend - soft diet okay - repeat Hgb today to trend - would hold Eliquis another few days due to rebleeding risk, resume Monday or Tuesday if okay with primary service - will add protonix 40mg  BID for the next 2 weeks to reduce risk for rebleeding - okay for discharge later today or tomorrow, patient would prefer tomorrow - we will await final pathology results and contact patient with recommendations based on those results, and coordinate follow up  We will sign off for now, please call with questions moving forward.  Jolly Mango, MD [facility]

## 2021-07-28 NOTE — Progress Notes (Signed)
OT Cancellation Note  Patient Details Name: Alexis [name] MRN: [id number] DOB: 12/08/1950   Cancelled Treatment:    Reason Eval/Treat Not Completed: OT with x2 attempt this AM to complete treatment but pt unavailable or with another provide. If able OT will continue with attempts.   Jazzmyn Filion OTR/L acute rehab services Office: [place] 07/28/2021, 12:11 PM

## 2021-07-29 ENCOUNTER — Encounter (HOSPITAL_COMMUNITY): Payer: Self-pay | Admitting: Gastroenterology

## 2021-07-29 LAB — BRAIN NATRIURETIC PEPTIDE: B Natriuretic Peptide: 148.5 pg/mL — ABNORMAL HIGH (ref 0.0–100.0)

## 2021-07-29 LAB — CBC
HCT: 32.1 % — ABNORMAL LOW (ref 36.0–46.0)
Hemoglobin: 10 g/dL — ABNORMAL LOW (ref 12.0–15.0)
MCH: 26 pg (ref 26.0–34.0)
MCHC: 31.2 g/dL (ref 30.0–36.0)
MCV: 83.6 fL (ref 80.0–100.0)
Platelets: 273 10*3/uL (ref 150–400)
RBC: 3.84 MIL/uL — ABNORMAL LOW (ref 3.87–5.11)
RDW: 17 % — ABNORMAL HIGH (ref 11.5–15.5)
WBC: 7.5 10*3/uL (ref 4.0–10.5)
nRBC: 0 % (ref 0.0–0.2)

## 2021-07-29 LAB — BASIC METABOLIC PANEL
Anion gap: 6 (ref 5–15)
BUN: 11 mg/dL (ref 8–23)
CO2: 30 mmol/L (ref 22–32)
Calcium: 8.6 mg/dL — ABNORMAL LOW (ref 8.9–10.3)
Chloride: 103 mmol/L (ref 98–111)
Creatinine, Ser: 0.77 mg/dL (ref 0.44–1.00)
GFR, Estimated: >60 mL/min (ref >60)
Glucose, Bld: 110 mg/dL — ABNORMAL HIGH (ref 70–99)
Potassium: 3.8 mmol/L (ref 3.5–5.1)
Sodium: 139 mmol/L (ref 135–145)

## 2021-07-29 LAB — GLUCOSE, CAPILLARY: Glucose-Capillary: 111 mg/dL — ABNORMAL HIGH (ref 70–99)

## 2021-07-29 MED ORDER — PANTOPRAZOLE SODIUM 40 MG PO TBEC: 40.0000 mg | DELAYED_RELEASE_TABLET | Freq: Two times a day (BID) | ORAL | 2 refills | Status: AC

## 2021-07-29 MED ORDER — ATORVASTATIN CALCIUM 20 MG PO TABS: 20.0000 mg | ORAL_TABLET | Freq: Every day | ORAL | 2 refills | Status: DC

## 2021-07-29 MED ORDER — SPIRONOLACTONE 25 MG PO TABS: 25.0000 mg | ORAL_TABLET | Freq: Every day | ORAL | 2 refills | Status: DC

## 2021-07-29 MED ORDER — TORSEMIDE 20 MG PO TABS: 20.0000 mg | ORAL_TABLET | Freq: Every day | ORAL | 2 refills | Status: DC

## 2021-07-29 MED ORDER — TORSEMIDE 20 MG PO TABS: 20.0000 mg | ORAL_TABLET | Freq: Every day | ORAL | Status: DC

## 2021-07-29 MED ORDER — METOPROLOL SUCCINATE ER 50 MG PO TB24: 50.0000 mg | ORAL_TABLET | Freq: Every day | ORAL | Status: DC

## 2021-07-29 MED ORDER — APIXABAN 5 MG PO TABS: 5.0000 mg | ORAL_TABLET | Freq: Two times a day (BID) | ORAL | Status: DC

## 2021-07-29 NOTE — TOC Transition Note (Signed)
Transition of Care [facility]) - CM/SW Discharge Note   Patient Details  Name: Alexis [name] MRN: [id number] Date of Birth: 1951/03/10  Transition of Care [facility] CM/SW Contact:  Carles Collet, RN Phone Number: 07/29/2021, 11:15 AM   Clinical Narrative:   Patient would like to follow up with [facility] outpatient PT where she has gone before. Referral cannot be made over the weekend. Discussed this with patient. Patient will call PCP tomorrow to get them to send referral.  No other needs identified.     Final next level of care: Home/Self Care Barriers to Discharge: Continued Medical Work up   Patient Goals and CMS Choice Patient states their goals for this hospitalization and ongoing recovery are:: return home with outpt therapy   Choice offered to / list presented to : NA  Discharge Placement                       Discharge Plan and Services   Discharge Planning Services: CM Consult Post Acute Care Choice: NA            DME Agency: NA       HH Arranged: NA          Social Determinants of Health (SDOH) Interventions     Readmission Risk Interventions Readmission Risk Prevention Plan 07/25/2021  Transportation Screening Complete  PCP or Specialist Appt within 3-5 Days Complete  HRI or [place] Complete  Social Work Consult for [place] Planning/Counseling Complete  Palliative Care Screening Not Applicable  Medication Review Press photographer) Complete  Some recent data might be hidden

## 2021-07-29 NOTE — Discharge Summary (Signed)
Physician Discharge Summary  NIVEA WOJDYLA UXL:[id number] DOB: [birth year] DOA: 07/24/2021  PCP: Deland Pretty, MD  Admit date: 07/24/2021 Discharge date: 07/29/2021  Admitted From: Home.  Disposition:  Home.   Recommendations for Outpatient Follow-up:  Follow up with PCP in 1-2 weeks Please obtain BMP/CBC in one week Please follow up with cardiology and gastroenterology as recommended and scheduled.     Discharge Condition: stable.  CODE STATUS: Full code.  Diet recommendation: Heart Healthy   Brief/Interim Summary:  CANTRELL MARTUS is a 70 y.o. female with medical history significant of  CHF, A.fib DM2, Gastroparesis, GERD, HTN, HLD, OSA fatty liver disease, IBS, complaining of chest pain or sob. She was admitted for acute on chronic diastolic heart failure.  She was started in IV lasix with good diuresis. Her hemoglobin yesterday evening was around 9 and it has dropped to . 7.1 this am. She was given a unit of PRBC transfusion and repeat hemoglobin is around 10. She is initially scheduled to get a colonoscopy in January as outpatient. But with her drop in hemoglobin overnight, Cardiology recommendations, requested GI consult for further evaluation of GI bleed. Her stool for occult blood is positive. She is scheduled for EGD and colonoscopy.     Discharge Diagnoses:  Principal Problem:   CHF exacerbation ([place]) Active Problems:   DM2 (diabetes mellitus, type 2) ([place])   Hyperlipidemia   Essential hypertension   GERD   OSA (obstructive sleep apnea)   Obesity (BMI 30-39.9)   Type 2 diabetes mellitus ([place])   Diabetic peripheral neuropathy (HCC)   Paroxysmal atrial fibrillation (HCC)   Acute on chronic respiratory failure with hypoxia (HCC)   Acute on chronic diastolic CHF (congestive heart failure) (HCC)   Hypokalemia   Anemia   Heme positive stool   Chronic diarrhea   Anticoagulated   Benign neoplasm of colon   Gastric polyps  Acute on chronic diastolic heart  failure: / Acute Respiratory failure with hypoxia on 2 lit of [place] oxygen.  Echocardiogram yesterday shows  Left ventricular ejection fraction, by estimation, is 65 to 70%. The  left ventricle has normal function.without any regional wall abnormalities. Left ventricular diastolic parameters are  consistent with Grade II diastolic dysfunction (pseudonormalization).  She was initially started on IV lasix , transitioned to oral torsemide 20 mg daily. Wean her off oxygen today.  Strict intake and output. Continue with spironolactone and Jardiance on discharge. Appreciate cardiology recommendations.       Type 2 DM with hyperglycemia Resume home meds.  Resume semglee and SSI.  No changes in meds.      Hypertension;  BP parameters are well controlled.     PAF Rate controlled with metoprolol.  On anticoagulation , which is on hold for GI bleed evaluation. To be resumed on Tuesday.      Hyperlipidemia:  - Lipitor.      Acute anemia of blood loss:  Hemoglobin drop to 7.1, S/p 1 unit of prbc transfusion. Recheck hemoglobin around 11. GI Consulted, underwent EGD Large gastric polyp in the cardia suspicious for heme positive stool in the setting of anticoagulation - very inflamed. Biopsied, results pending. Recommend outpatient follow up.  Colonoscopy done and results reviewed witht he patient.  Holding eliquis for 3 days. repeat H&H stable.   Fall with right foot pain:  X rays show Mildly displaced proximal fifth metatarsal fracture. Pt is currently in a boot.  Plan for weight bearing on the right foot in the boot. Discussed with  orthopedics. recommend outpatient follow up.    Hypokalemia Replaced. Repeat level wnl.      Peripheral neuropathy from DM Resume home meds.    OSA  ON CPAP at night.      Body mass index is 29.82 kg/m. Obesity:      Discharge Instructions  Discharge Instructions     Diet - low sodium heart healthy   Complete by: As directed    Discharge  instructions   Complete by: As directed    Please follow up with cardiology and gastroenterology as scheduled.   Heart Failure patients record your daily weight using the same scale at the same time of day   Complete by: As directed    Increase activity slowly   Complete by: As directed       Allergies as of 07/29/2021       Reactions   Aspirin Other (See Comments)   Reaction: Vasculitis per MD   Codeine Itching, Rash   Tolerated Norco 12/07/20 with no reports of rash/itching. Patient endorsed allergy >30 yr ago and hasn't had reaction since.   Naproxen Nausea Only   Reaction: Makes stomach upset/irritated.   Sulfonamide Derivatives Itching, Rash        Medication List     STOP taking these medications    amLODipine 5 MG tablet Commonly known as: NORVASC   furosemide 20 MG tablet Commonly known as: LASIX   RABEprazole 20 MG tablet Commonly known as: ACIPHEX       TAKE these medications    acetaminophen 325 MG tablet Commonly known as: TYLENOL Take 2 tablets (650 mg total) by mouth every 6 (six) hours as needed for mild pain or moderate pain.   ALPRAZolam 0.5 MG tablet Commonly known as: XANAX Take 0.5 mg by mouth at bedtime.   amiodarone 200 MG tablet Commonly known as: PACERONE Take 1 tablet (200 mg total) by mouth daily. What changed: when to take this   apixaban 5 MG Tabs tablet Commonly known as: ELIQUIS Take 1 tablet (5 mg total) by mouth 2 (two) times daily. Start taking on: July 31, 2021 What changed: These instructions start on July 31, 2021. If you are unsure what to do until then, ask your doctor or other care provider.   atorvastatin 20 MG tablet Commonly known as: LIPITOR Take 1 tablet (20 mg total) by mouth daily.   b complex vitamins tablet Take 1 tablet by mouth at bedtime.   Farxiga 10 MG Tabs tablet Generic drug: dapagliflozin propanediol Take 10 mg by mouth daily.   fish oil-omega-3 fatty acids 1000 MG capsule Take 1  g by mouth at bedtime.   insulin lispro 100 UNIT/ML KwikPen Commonly known as: HUMALOG Inject 8 Units into the skin 3 (three) times daily as needed (high blood sugar). Per sliding scale   metFORMIN 500 MG 24 hr tablet Commonly known as: GLUCOPHAGE-XR Take 500 mg by mouth daily.   metoprolol succinate 50 MG 24 hr tablet Commonly known as: TOPROL-XL Take 1 tablet (50 mg total) by mouth daily. Take with or immediately following a meal. What changed:  how much to take when to take this   pantoprazole 40 MG tablet Commonly known as: PROTONIX Take 1 tablet (40 mg total) by mouth 2 (two) times daily.   pregabalin 50 MG capsule Commonly known as: LYRICA Take 50 mg by mouth 2 (two) times daily.   spironolactone 25 MG tablet Commonly known as: ALDACTONE Take 1 tablet (25 mg total) by  mouth daily.   tamsulosin 0.4 MG Caps capsule Commonly known as: FLOMAX Take 0.4 mg by mouth at bedtime.   torsemide 20 MG tablet Commonly known as: DEMADEX Take 1 tablet (20 mg total) by mouth daily.   Tyler Aas FlexTouch 100 UNIT/ML FlexTouch Pen Generic drug: insulin degludec Inject 20 Units into the skin at bedtime.   Trulicity 1.5 LN/9.8XQ Sopn Generic drug: Dulaglutide Inject 1.5 mg into the skin once a week.   valsartan 320 MG tablet Commonly known as: DIOVAN Take 320 mg by mouth daily.   venlafaxine XR 150 MG 24 hr capsule Commonly known as: EFFEXOR-XR Take 150 mg by mouth 2 (two) times daily.   zaleplon 5 MG capsule Commonly known as: SONATA Take 5 mg by mouth at bedtime.        Allergies  Allergen Reactions   Aspirin Other (See Comments)    Reaction: Vasculitis per MD   Codeine Itching and Rash    Tolerated Norco 12/07/20 with no reports of rash/itching. Patient endorsed allergy >30 yr ago and hasn't had reaction since.   Naproxen Nausea Only    Reaction: Makes stomach upset/irritated.   Sulfonamide Derivatives Itching and Rash     Consultations: Cardiology Gastroenterology.    Procedures/Studies: DG Chest 2 View  Result Date: 07/24/2021 CLINICAL DATA:  Shortness of breath and hypoxia EXAM: CHEST - 2 VIEW COMPARISON:  04/26/2021 FINDINGS: Cardiomegaly. Aortic atherosclerosis. Pulmonary venous hypertension. Bilateral pleural effusions with dependent pulmonary atelectasis. Findings consistent with congestive heart failure. Upper lungs are otherwise clear. IMPRESSION: Congestive heart failure. Cardiomegaly, aortic atherosclerosis, pulmonary venous hypertension, small pleural effusions and dependent atelectasis. Electronically Signed   By: Nelson Chimes M.D.   On: 07/24/2021 14:39   DG Foot Complete Right  Result Date: 07/26/2021 CLINICAL DATA:  Right foot pain and swelling. EXAM: RIGHT FOOT COMPLETE - 3+ VIEW COMPARISON:  December 04, 2020. FINDINGS: Mildly displaced fracture is seen involving the proximal base of the fifth metatarsal. No other bony abnormality is noted. No soft tissue abnormality is noted. Joint spaces are intact. IMPRESSION: Mildly displaced proximal fifth metatarsal fracture. Electronically Signed   By: Marijo Conception M.D.   On: 07/26/2021 15:43   ECHOCARDIOGRAM COMPLETE  Result Date: 07/25/2021    ECHOCARDIOGRAM REPORT   Patient Name:   TOMICKA LOVER Date of Exam: 07/25/2021 Medical Rec #:  [id number]         Height:       65.0 in Accession #:    [id number]        Weight:       180.0 lb Date of Birth:  [birth year]         BSA:          1.892 m Patient Age:    21 years          BP:           111/91 mmHg Patient Gender: F                 HR:           63 bpm. Exam Location:  Inpatient Procedure: Cardiac Doppler, Color Doppler and Intracardiac Opacification Agent Indications:    congestive heart failure  History:        Patient has prior history of Echocardiogram examinations, most                 recent 05/02/2021. CHF, Previous Myocardial Infarction,  Signs/Symptoms:Shortness of Breath and  Syncope; Risk                 Factors:Diabetes, Hypertension and Dyslipidemia.  Sonographer:    Melissa Morford RDCS (AE, PE) Referring Phys: [employer]  1. Left ventricular ejection fraction, by estimation, is 65 to 70%. The left ventricle has normal function. The left ventricle has no regional wall motion abnormalities. Left ventricular diastolic parameters are consistent with Grade II diastolic dysfunction (pseudonormalization).  2. Right ventricular systolic function is normal. The right ventricular size is normal.  3. The mitral valve is degenerative. No evidence of mitral valve regurgitation. No evidence of mitral stenosis. Moderate mitral annular calcification.  4. The aortic valve is tricuspid. Aortic valve regurgitation is not visualized. Aortic valve sclerosis is present, with no evidence of aortic valve stenosis.  5. The inferior vena cava is normal in size with <50% respiratory variability, suggesting right atrial pressure of 8 mmHg. Comparison(s): No significant change from prior study. FINDINGS  Left Ventricle: Left ventricular ejection fraction, by estimation, is 65 to 70%. The left ventricle has normal function. The left ventricle has no regional wall motion abnormalities. Definity contrast agent was given IV to delineate the left ventricular  endocardial borders. The left ventricular internal cavity size was normal in size. There is no left ventricular hypertrophy. Left ventricular diastolic parameters are consistent with Grade II diastolic dysfunction (pseudonormalization). Right Ventricle: The right ventricular size is normal. No increase in right ventricular wall thickness. Right ventricular systolic function is normal. The tricuspid regurgitant velocity is 2.81 m/s, and with an assumed right atrial pressure of 8 mmHg, the estimated right ventricular systolic pressure is 59.1 mmHg. Left Atrium: Left atrial size was normal in size. Right Atrium: Right atrial size was  normal in size. Pericardium: There is no evidence of pericardial effusion. Mitral Valve: The mitral valve is degenerative in appearance. Moderate mitral annular calcification. No evidence of mitral valve regurgitation. No evidence of mitral valve stenosis. Tricuspid Valve: The tricuspid valve is normal in structure. Tricuspid valve regurgitation is trivial. No evidence of tricuspid stenosis. Aortic Valve: The aortic valve is tricuspid. Aortic valve regurgitation is not visualized. Aortic valve sclerosis is present, with no evidence of aortic valve stenosis. Pulmonic Valve: The pulmonic valve was not well visualized. Pulmonic valve regurgitation is not visualized. No evidence of pulmonic stenosis. Aorta: The aortic root is normal in size and structure. Venous: The inferior vena cava is normal in size with less than 50% respiratory variability, suggesting right atrial pressure of 8 mmHg. IAS/Shunts: The atrial septum is grossly normal.  LEFT VENTRICLE PLAX 2D LVIDd:         4.60 cm   Diastology LVIDs:         2.80 cm   LV e' medial:    4.68 cm/s LV PW:         1.00 cm   LV E/e' medial:  24.4 LV IVS:        1.00 cm   LV e' lateral:   4.79 cm/s LVOT diam:     1.90 cm   LV E/e' lateral: 23.8 LV SV:         80 LV SV Index:   42 LVOT Area:     2.84 cm  RIGHT VENTRICLE RV S prime:     14.50 cm/s TAPSE (M-mode): 2.2 cm LEFT ATRIUM             Index  RIGHT ATRIUM           Index LA diam:        4.50 cm 2.38 cm/m   RA Area:     16.90 cm LA Vol (A2C):   51.0 ml 26.96 ml/m  RA Volume:   48.70 ml  25.75 ml/m LA Vol (A4C):   45.4 ml 24.00 ml/m LA Biplane Vol: 48.1 ml 25.43 ml/m  AORTIC VALVE LVOT Vmax:   125.00 cm/s LVOT Vmean:  87.700 cm/s LVOT VTI:    0.283 m  AORTA Ao Root diam: 2.80 cm MITRAL VALVE                TRICUSPID VALVE MV Area (PHT): 5.54 cm     TR Peak grad:   31.6 mmHg MV Decel Time: 137 msec     TR Vmax:        281.00 cm/s MV E velocity: 114.00 cm/s MV A velocity: 107.00 cm/s  SHUNTS MV E/A ratio:   1.07         Systemic VTI:  0.28 m                             Systemic Diam: 1.90 cm Rudean Haskell MD Electronically signed by Rudean Haskell MD Signature Date/Time: 07/25/2021/3:06:53 PM    Final    VAS [place] LOWER EXTREMITY VENOUS (DVT)  Result Date: 07/26/2021  Lower Venous DVT Study Patient Name:  TAFFANY HEISER  Date of Exam:   07/26/2021 Medical Rec #: [id number]          Accession #:    [id number] Date of Birth: [birth year]          Patient Gender: F Patient Age:   70 years Exam Location:  [facility] Procedure:      VAS [place] LOWER EXTREMITY VENOUS (DVT) Referring Phys: Hosie Poisson --------------------------------------------------------------------------------  Indications: Swelling, recent fall.  Risk Factors: History of uterine cancer. Comparison Study: 07-27-2019 Lower extremity venous RT for swelling/tingling +1                   year negative for DVT. Performing Technologist: Darlin Coco RDMS, RVT  Examination Guidelines: A complete evaluation includes B-mode imaging, spectral Doppler, color Doppler, and power Doppler as needed of all accessible portions of each vessel. Bilateral testing is considered an integral part of a complete examination. Limited examinations for reoccurring indications may be performed as noted. The reflux portion of the exam is performed with the patient in reverse Trendelenburg.  +---------+---------------+---------+-----------+----------+--------------+  RIGHT     Compressibility Phasicity Spontaneity Properties Thrombus Aging  +---------+---------------+---------+-----------+----------+--------------+  CFV       Full            Yes       Yes                                    +---------+---------------+---------+-----------+----------+--------------+  SFJ       Full                                                             +---------+---------------+---------+-----------+----------+--------------+  FV Prox   Full                                                              +---------+---------------+---------+-----------+----------+--------------+  FV Mid    Full                                                             +---------+---------------+---------+-----------+----------+--------------+  FV Distal Full                                                             +---------+---------------+---------+-----------+----------+--------------+  PFV       Full                                                             +---------+---------------+---------+-----------+----------+--------------+  POP       Full            Yes       Yes                                    +---------+---------------+---------+-----------+----------+--------------+  PTV       Full                                                             +---------+---------------+---------+-----------+----------+--------------+  PERO      Full                                                             +---------+---------------+---------+-----------+----------+--------------+  Gastroc   Full                                                             +---------+---------------+---------+-----------+----------+--------------+   +---------+---------------+---------+-----------+----------+--------------+  LEFT      Compressibility Phasicity Spontaneity Properties Thrombus Aging  +---------+---------------+---------+-----------+----------+--------------+  CFV       Full            Yes       Yes                                    +---------+---------------+---------+-----------+----------+--------------+  SFJ       Full                                                             +---------+---------------+---------+-----------+----------+--------------+  FV Prox   Full                                                             +---------+---------------+---------+-----------+----------+--------------+  FV Mid    Full                                                              +---------+---------------+---------+-----------+----------+--------------+  FV Distal Full                                                             +---------+---------------+---------+-----------+----------+--------------+  PFV       Full                                                             +---------+---------------+---------+-----------+----------+--------------+  POP       Full            Yes       Yes                                    +---------+---------------+---------+-----------+----------+--------------+  PTV       Full                                                             +---------+---------------+---------+-----------+----------+--------------+  PERO      Full                                                             +---------+---------------+---------+-----------+----------+--------------+  Gastroc   Full                                                             +---------+---------------+---------+-----------+----------+--------------+     Summary: RIGHT: - There is no evidence of deep vein thrombosis in the lower extremity.  - No cystic structure found in the popliteal fossa.  - Ultrasound characteristics of enlarged lymph nodes are noted in the groin.  LEFT: - There is no evidence of deep vein thrombosis in the lower extremity.  - No cystic  structure found in the popliteal fossa.  - Ultrasound characteristics of enlarged, echogenic lymph nodes noted in the groin.  *See table(s) above for measurements and observations. Electronically signed by Monica Martinez MD on 07/26/2021 at 7:56:44 PM.    Final       Subjective:  No new complaints.  Discharge Exam: Vitals:   07/28/21 1933 07/29/21 0330  BP: (!) 145/62 (!) 114/42  Pulse: 66 66  Resp: 18 18  Temp: 99 F (37.2 C) 98.6 F (37 C)  SpO2: 100% 100%   Vitals:   07/28/21 1933 07/29/21 0013 07/29/21 0330 07/29/21 0500  BP: (!) 145/62  (!) 114/42   Pulse: 66  66   Resp: 18  18   Temp: 99 F (37.2 C)  98.6 F  (37 C)   TempSrc: Oral  Oral   SpO2: 100%  100%   Weight:  80.6 kg  80.6 kg  Height:        General: Pt is alert, awake, not in acute distress Cardiovascular: RRR, S1/S2 +, no rubs, no gallops Respiratory: CTA bilaterally, no wheezing, no rhonchi Abdominal: Soft, NT, ND, bowel sounds + Extremities: no edema, no cyanosis    The results of significant diagnostics from this hospitalization (including imaging, microbiology, ancillary and laboratory) are listed below for reference.     Microbiology: Recent Results (from the past 240 hour(s))  Resp Panel by RT-PCR (Flu A&B, Covid) Nasopharyngeal Swab     Status: None   Collection Time: 07/24/21  2:38 PM   Specimen: Nasopharyngeal Swab; Nasopharyngeal(NP) swabs in vial transport medium  Result Value Ref Range Status   SARS Coronavirus 2 by RT PCR NEGATIVE NEGATIVE Final    Comment: (NOTE) SARS-CoV-2 target nucleic acids are NOT DETECTED.  The SARS-CoV-2 RNA is generally detectable in upper respiratory specimens during the acute phase of infection. The lowest concentration of SARS-CoV-2 viral copies this assay can detect is 138 copies/mL. A negative result does not preclude SARS-Cov-2 infection and should not be used as the sole basis for treatment or other patient management decisions. A negative result may occur with  improper specimen collection/handling, submission of specimen other than nasopharyngeal swab, presence of viral mutation(s) within the areas targeted by this assay, and inadequate number of viral copies(<138 copies/mL). A negative result must be combined with clinical observations, patient history, and epidemiological information. The expected result is Negative.  Fact Sheet for Patients:  EntrepreneurPulse.com.au  Fact Sheet for Healthcare Providers:  IncredibleEmployment.be  This test is no t yet approved or cleared by the [place] FDA and  has been authorized for  detection and/or diagnosis of SARS-CoV-2 by FDA under an Emergency Use Authorization (EUA). This EUA will remain  in effect (meaning this test can be used) for the duration of the COVID-19 declaration under Section 564(b)(1) of the Act, 21 U.S.C.section 360bbb-3(b)(1), unless the authorization is terminated  or revoked sooner.       Influenza A by PCR NEGATIVE NEGATIVE Final   Influenza B by PCR NEGATIVE NEGATIVE Final    Comment: (NOTE) The Xpert Xpress SARS-CoV-2/FLU/RSV plus assay is intended as an aid in the diagnosis of influenza from Nasopharyngeal swab specimens and should not be used as a sole basis for treatment. Nasal washings and aspirates are unacceptable for Xpert Xpress SARS-CoV-2/FLU/RSV testing.  Fact Sheet for Patients: EntrepreneurPulse.com.au  Fact Sheet for Healthcare Providers: IncredibleEmployment.be  This test is not yet approved or cleared by the [place] FDA and has been authorized for detection and/or  diagnosis of SARS-CoV-2 by FDA under an Emergency Use Authorization (EUA). This EUA will remain in effect (meaning this test can be used) for the duration of the COVID-19 declaration under Section 564(b)(1) of the Act, 21 U.S.C. section 360bbb-3(b)(1), unless the authorization is terminated or revoked.  Performed at [facility], [place] [address], [place], [place] [address]      Labs: BNP (last 3 results) Recent Labs    04/26/21 0949 07/24/21 1355 07/29/21 0501  BNP 663.8* 868.1* 672.0*   Basic Metabolic Panel: Recent Labs  Lab 07/24/21 1638 07/24/21 1931 07/24/21 2210 07/25/21 1100 07/25/21 1650 07/26/21 0250 07/27/21 0623 07/28/21 0218 07/29/21 0501  NA  --   --    < > 137 137 140 139 138 139  K  --   --    < > 3.2* 3.3* 3.8 5.0 4.1 3.8  CL  --   --    < > 103 102 103 103 103 103  CO2  --   --    < > 26 26 29 29 27 30   GLUCOSE  --   --    < > 135* 161* 103* 123* 90 110*  BUN  --   --     < > 13 14 12 10 8 11   CREATININE  --   --    < > 0.93 0.80 0.88 0.65 0.69 0.77  CALCIUM  --   --    < > 8.0* 8.3* 8.2* 8.8* 8.8* 8.6*  MG 2.2  --   --  1.8  --   --   --   --   --   PHOS  --  2.3*  --  3.6  --   --   --   --   --    < > = values in this interval not displayed.   Liver Function Tests: Recent Labs  Lab 07/24/21 1931 07/25/21 1100  AST 23 34  ALT 28 41  ALKPHOS 62 88  BILITOT 0.8 1.2  PROT 4.5* 6.3*  ALBUMIN 1.8* 2.7*   No results for input(s): LIPASE, AMYLASE in the last 168 hours. No results for input(s): AMMONIA in the last 168 hours. CBC: Recent Labs  Lab 07/24/21 2210 07/25/21 0237 07/25/21 1014 07/25/21 1100 07/25/21 1650 07/25/21 2259 07/26/21 1639 07/27/21 0119 07/28/21 1355 07/29/21 0501  WBC 8.0 6.4   < > 7.5 7.6 7.9  --  7.8  --  7.5  NEUTROABS 5.9 4.5  --   --   --   --   --   --   --   --   HGB 9.1* 7.1*   < > 10.1* 10.2* 10.0* 10.4* 11.0* 10.6* 10.0*  HCT 28.7* 23.2*   < > 31.8* 31.7* 31.8* 32.4* 35.1* 34.0* 32.1*  MCV 82.7 84.4   < > 82.6 81.5 82.4  --  83.6  --  83.6  PLT 217 200   < > 237 255 255  --  295  --  273   < > = values in this interval not displayed.   Cardiac Enzymes: Recent Labs  Lab 07/24/21 1931  CKTOTAL 61   BNP: Invalid input(s): POCBNP CBG: Recent Labs  Lab 07/28/21 0801 07/28/21 1144 07/28/21 1641 07/28/21 2059 07/29/21 0608  GLUCAP 97 209* 131* 192* 111*   D-Dimer No results for input(s): DDIMER in the last 72 hours. Hgb A1c No results for input(s): HGBA1C in the last 72 hours. Lipid Profile No  results for input(s): CHOL, HDL, LDLCALC, TRIG, CHOLHDL, LDLDIRECT in the last 72 hours. Thyroid function studies No results for input(s): TSH, T4TOTAL, T3FREE, THYROIDAB in the last 72 hours.  Invalid input(s): FREET3 Anemia work up No results for input(s): VITAMINB12, FOLATE, FERRITIN, TIBC, IRON, RETICCTPCT in the last 72 hours. Urinalysis    Component Value Date/Time   COLORURINE YELLOW 07/24/2021  1950   APPEARANCEUR HAZY (A) 07/24/2021 1950   LABSPEC 1.010 07/24/2021 1950   PHURINE 6.0 07/24/2021 1950   GLUCOSEU >=500 (A) 07/24/2021 1950   HGBUR TRACE (A) 07/24/2021 1950   BILIRUBINUR NEGATIVE 07/24/2021 1950   KETONESUR NEGATIVE 07/24/2021 1950   PROTEINUR NEGATIVE 07/24/2021 1950   UROBILINOGEN 0.2 02/09/2014 1603   NITRITE NEGATIVE 07/24/2021 1950   LEUKOCYTESUR NEGATIVE 07/24/2021 1950   Sepsis Labs Invalid input(s): PROCALCITONIN,  WBC,  LACTICIDVEN Microbiology Recent Results (from the past 240 hour(s))  Resp Panel by RT-PCR (Flu A&B, Covid) Nasopharyngeal Swab     Status: None   Collection Time: 07/24/21  2:38 PM   Specimen: Nasopharyngeal Swab; Nasopharyngeal(NP) swabs in vial transport medium  Result Value Ref Range Status   SARS Coronavirus 2 by RT PCR NEGATIVE NEGATIVE Final    Comment: (NOTE) SARS-CoV-2 target nucleic acids are NOT DETECTED.  The SARS-CoV-2 RNA is generally detectable in upper respiratory specimens during the acute phase of infection. The lowest concentration of SARS-CoV-2 viral copies this assay can detect is 138 copies/mL. A negative result does not preclude SARS-Cov-2 infection and should not be used as the sole basis for treatment or other patient management decisions. A negative result may occur with  improper specimen collection/handling, submission of specimen other than nasopharyngeal swab, presence of viral mutation(s) within the areas targeted by this assay, and inadequate number of viral copies(<138 copies/mL). A negative result must be combined with clinical observations, patient history, and epidemiological information. The expected result is Negative.  Fact Sheet for Patients:  EntrepreneurPulse.com.au  Fact Sheet for Healthcare Providers:  IncredibleEmployment.be  This test is no t yet approved or cleared by the [place] FDA and  has been authorized for detection and/or diagnosis  of SARS-CoV-2 by FDA under an Emergency Use Authorization (EUA). This EUA will remain  in effect (meaning this test can be used) for the duration of the COVID-19 declaration under Section 564(b)(1) of the Act, 21 U.S.C.section 360bbb-3(b)(1), unless the authorization is terminated  or revoked sooner.       Influenza A by PCR NEGATIVE NEGATIVE Final   Influenza B by PCR NEGATIVE NEGATIVE Final    Comment: (NOTE) The Xpert Xpress SARS-CoV-2/FLU/RSV plus assay is intended as an aid in the diagnosis of influenza from Nasopharyngeal swab specimens and should not be used as a sole basis for treatment. Nasal washings and aspirates are unacceptable for Xpert Xpress SARS-CoV-2/FLU/RSV testing.  Fact Sheet for Patients: EntrepreneurPulse.com.au  Fact Sheet for Healthcare Providers: IncredibleEmployment.be  This test is not yet approved or cleared by the [place] FDA and has been authorized for detection and/or diagnosis of SARS-CoV-2 by FDA under an Emergency Use Authorization (EUA). This EUA will remain in effect (meaning this test can be used) for the duration of the COVID-19 declaration under Section 564(b)(1) of the Act, 21 U.S.C. section 360bbb-3(b)(1), unless the authorization is terminated or revoked.  Performed at [facility], [place] [address] [place], [place] [address]      Time coordinating discharge: 40 minutes.   SIGNED:   Hosie Poisson, MD  [facility]  07/29/2021, 10:35 AM

## 2021-07-29 NOTE — Progress Notes (Signed)
Cardiology Progress Note  Patient ID: Alexis [name] MRN: [id number] DOB: [birth year] Date of Encounter: 07/29/2021  Primary Cardiologist: Pixie Casino, MD  Subjective   Patient feels well, is waiting for discharge and a ride.  Inpatient Medications  Scheduled Meds:  amiodarone  200 mg Oral Daily   atorvastatin  20 mg Oral Daily   empagliflozin  10 mg Oral Daily   insulin aspart  0-9 Units Subcutaneous TID WC   insulin glargine-yfgn  20 Units Subcutaneous QHS   irbesartan  300 mg Oral Daily   metoprolol tartrate  25 mg Oral BID   pantoprazole  40 mg Oral BID   pregabalin  50 mg Oral BID   sodium chloride flush  3 mL Intravenous Q12H   spironolactone  25 mg Oral Daily   tamsulosin  0.4 mg Oral QHS   torsemide  20 mg Oral Daily   venlafaxine XR  150 mg Oral BID   Continuous Infusions:  sodium chloride     PRN Meds: sodium chloride, acetaminophen **OR** acetaminophen, ALPRAZolam, sodium chloride flush   Vital Signs   Vitals:   07/28/21 1933 07/29/21 0013 07/29/21 0330 07/29/21 0500  BP: (!) 145/62  (!) 114/42   Pulse: 66  66   Resp: 18  18   Temp: 99 F (37.2 C)  98.6 F (37 C)   TempSrc: Oral  Oral   SpO2: 100%  100%   Weight:  80.6 kg  80.6 kg  Height:        Intake/Output Summary (Last 24 hours) at 07/29/2021 1206 Last data filed at 07/29/2021 0905 Gross per 24 hour  Intake 460 ml  Output --  Net 460 ml   Last 3 Weights 07/29/2021 07/29/2021 07/28/2021  Weight (lbs) 177 lb 11.2 oz 177 lb 11.2 oz 179 lb 3.2 oz  Weight (kg) 80.604 kg 80.604 kg 81.285 kg       Physical Exam   Vitals:   07/28/21 1933 07/29/21 0013 07/29/21 0330 07/29/21 0500  BP: (!) 145/62  (!) 114/42   Pulse: 66  66   Resp: 18  18   Temp: 99 F (37.2 C)  98.6 F (37 C)   TempSrc: Oral  Oral   SpO2: 100%  100%   Weight:  80.6 kg  80.6 kg  Height:        Intake/Output Summary (Last 24 hours) at 07/29/2021 1206 Last data filed at 07/29/2021 0905 Gross per 24 hour   Intake 460 ml  Output --  Net 460 ml    Last 3 Weights 07/29/2021 07/29/2021 07/28/2021  Weight (lbs) 177 lb 11.2 oz 177 lb 11.2 oz 179 lb 3.2 oz  Weight (kg) 80.604 kg 80.604 kg 81.285 kg    Body mass index is 29.57 kg/m.   Gen: no distress,  Neck: No JVD Cardiac: No Rubs or Gallops, no Murmur, RRR Respiratory: Clear to auscultation bilaterally, normal effort, normal  respiratory rate GI: Soft, nontender, non-distended  MS: No  edema;  moves all extremities (R leg in boot) Integument: Skin feels warm Neuro:  At time of evaluation, alert and oriented to person/place/time/situation  Psych: Normal affect, patient feels well   Labs  High Sensitivity Troponin:   Recent Labs  Lab 07/24/21 1638 07/24/21 1931  TROPONINIHS 29* 20*     Cardiac EnzymesNo results for input(s): TROPONINI in the last 168 hours. No results for input(s): TROPIPOC in the last 168 hours.  Chemistry Recent Labs  Lab 07/24/21 1931 07/24/21 2210  07/25/21 1100 07/25/21 1650 07/27/21 0623 07/28/21 0218 07/29/21 0501  NA  --    < > 137   < > 139 138 139  K  --    < > 3.2*   < > 5.0 4.1 3.8  CL  --    < > 103   < > 103 103 103  CO2  --    < > 26   < > 29 27 30   GLUCOSE  --    < > 135*   < > 123* 90 110*  BUN  --    < > 13   < > 10 8 11   CREATININE  --    < > 0.93   < > 0.65 0.69 0.77  CALCIUM  --    < > 8.0*   < > 8.8* 8.8* 8.6*  PROT 4.5*  --  6.3*  --   --   --   --   ALBUMIN 1.8*  --  2.7*  --   --   --   --   AST 23  --  34  --   --   --   --   ALT 28  --  41  --   --   --   --   ALKPHOS 62  --  88  --   --   --   --   BILITOT 0.8  --  1.2  --   --   --   --   GFRNONAA  --    < > >60   < > >60 >60 >60  ANIONGAP  --    < > 8   < > 7 8 6    < > = values in this interval not displayed.    Hematology Recent Labs  Lab 07/25/21 2259 07/26/21 1639 07/27/21 0119 07/28/21 1355 07/29/21 0501  WBC 7.9  --  7.8  --  7.5  RBC 3.86*  --  4.20  --  3.84*  HGB 10.0*   < > 11.0* 10.6* 10.0*  HCT  31.8*   < > 35.1* 34.0* 32.1*  MCV 82.4  --  83.6  --  83.6  MCH 25.9*  --  26.2  --  26.0  MCHC 31.4  --  31.3  --  31.2  RDW 17.1*  --  17.3*  --  17.0*  PLT 255  --  295  --  273   < > = values in this interval not displayed.   BNP Recent Labs  Lab 07/24/21 1355 07/29/21 0501  BNP 868.1* 148.5*    DDimer No results for input(s): DDIMER in the last 168 hours.   Radiology  No results found.  Cardiac Studies  TTE 07/25/2021   1. Left ventricular ejection fraction, by estimation, is 65 to 70%. The  left ventricle has normal function. The left ventricle has no regional  wall motion abnormalities. Left ventricular diastolic parameters are  consistent with Grade II diastolic  dysfunction (pseudonormalization).   2. Right ventricular systolic function is normal. The right ventricular  size is normal.   3. The mitral valve is degenerative. No evidence of mitral valve  regurgitation. No evidence of mitral stenosis. Moderate mitral annular  calcification.   4. The aortic valve is tricuspid. Aortic valve regurgitation is not  visualized. Aortic valve sclerosis is present, with no evidence of aortic  valve stenosis.   5. The inferior vena cava is normal in size with <50%  respiratory  variability, suggesting right atrial pressure of 8 mmHg.   Patient Profile   Alexis [name] is a 70 y.o. female with diastolic heart failure, nonobstructive CAD, paroxysmal atrial fibrillation on amiodarone, diabetes, hypertension, hyperlipidemia who was admitted on 07/24/2021 for acute hypoxic respiratory failure secondary to acute on chronic diastolic heart failure.  Assessment & Plan   #Acute on Chronic HFpEF #HTN -Euvolemic- weaning of O2, BNP for 07/29/21 - on 20 mg PO torsemide daily -Aldactone 25 mg daily at discharge. -jardiance 10 mg daily  #Paroxysmal Afib - GI bleed with polys and biopsy -in NSR on amiodarone  -Eliquis restart planned for  Tuesday  #Hypokalemia #Hypoalbuminemia #Diarrhea - K goal of 4 (at goal today) - rest per primary- GI   For questions or updates, please contact [place] Please consult [email or web address] for contact info under   Rudean [facility], [place], [address] [place], [place] [address] [phone]  12:06 PM

## 2021-07-29 NOTE — Progress Notes (Signed)
Occupational Therapy Treatment Patient Details Name: SARANDA LEGRANDE MRN: [id number] DOB: 1950/10/13 Today's Date: 07/29/2021   History of present illness Pt is a 70 y.o. female who presented 07/24/21 with fatigue, weakness, hypoxia. CXR consistent with mild pulmonary edema. PMH: CHF, recent R foot fx, chronic back pain; DM; fibromyalgia; HTN; HLD; IBS; OSA; obesity;  and remote uterine cancer   OT comments  Kathrynne is progressing well with plans to d/c home today. Upon arrival pt was OOB without CAM boot or AD. Educated on fall prevention and wear schedule for CAM boot, she verbalized understanding. Overall pt completed BADLs with mod I-supervision A this session. Emphasized use of AD and fall prevention strategies, as pt has had multiple falls which led to injuries. Pt is d/c'ing home today with support of her husband.    Recommendations for follow up therapy are one component of a multi-disciplinary discharge planning process, led by the attending physician.  Recommendations may be updated based on patient status, additional functional criteria and insurance authorization.    Follow Up Recommendations  No OT follow up    Assistance Recommended at Discharge Intermittent Supervision/Assistance  Equipment Recommendations  None recommended by OT       Precautions / Restrictions Precautions Precautions: Fall Precaution Comments: hx of multiple falls Restrictions RLE Weight Bearing: Weight bearing as tolerated Other Position/Activity Restrictions: WBAT in CAM boot       Mobility Bed Mobility Overal bed mobility: Modified Independent                  Transfers Overall transfer level: Needs assistance Equipment used: None Transfers: Sit to/from Stand Sit to Stand: Supervision           General transfer comment: educated on use of RW for home and commun ity mobility to decrease risk of falls     Balance Overall balance assessment: Needs assistance Sitting-balance  support: No upper extremity supported Sitting balance-Leahy Scale: Good     Standing balance support: No upper extremity supported;During functional activity Standing balance-Leahy Scale: Fair                             ADL either performed or assessed with clinical judgement   ADL Overall ADL's : Needs assistance/impaired     Grooming: Supervision/safety;Standing           Upper Body Dressing : Modified independent;Sitting   Lower Body Dressing: Modified independent   Toilet Transfer: Supervision/safety;Ambulation   Toileting- Clothing Manipulation and Hygiene: Modified independent;Sitting/lateral lean       Functional mobility during ADLs: Supervision/safety;Cueing for safety;Cueing for sequencing General ADL Comments: Pt completed BADLs this session in preparation to d/c home. Educated on fall prevention and wear schedule of CAM boot.    Extremity/Trunk Assessment Upper Extremity Assessment Upper Extremity Assessment: Overall WFL for tasks assessed            Vision   Vision Assessment?: No apparent visual deficits          Cognition Arousal/Alertness: Awake/alert Behavior During Therapy: WFL for tasks assessed/performed Overall Cognitive Status: Impaired/Different from baseline Area of Impairment: Safety/judgement         Safety/Judgement: Decreased awareness of deficits;Decreased awareness of safety     General Comments: pt up and walking in room without CAM boot or AD. Educated on fall prevention          Exercises Exercises:  (CAM boot)      General Comments  VSS on RA, husband present    Pertinent Vitals/ Pain       Pain Assessment: No/denies pain Faces Pain Scale: No hurt   Frequency  Min 2X/week        Progress Toward Goals  OT Goals(current goals can now be found in the care plan section)  Progress towards OT goals: Progressing toward goals  Acute Rehab OT Goals OT Goal Formulation: With patient Time For  Goal Achievement: 08/08/21 Potential to Achieve Goals: Good ADL Goals Pt Will Perform Grooming: with modified independence;standing Pt Will Perform Lower Body Bathing: with modified independence;sit to/from stand Pt Will Perform Lower Body Dressing: with modified independence;sit to/from stand Pt Will Transfer to Toilet: with modified independence;ambulating Pt Will Perform Toileting - Clothing Manipulation and hygiene: with modified independence;sit to/from stand Additional ADL Goal #1: Pt will state at least 3 fall prevention strategies as instructed. Additional ADL Goal #2: Pt will generalize energy conservation strategies in ADL and mobility.  Plan Discharge plan remains appropriate       AM-PAC OT "6 Clicks" Daily Activity     Outcome Measure   Help from another person eating meals?: None Help from another person taking care of personal grooming?: A Little Help from another person toileting, which includes using toliet, bedpan, or urinal?: A Little Help from another person bathing (including washing, rinsing, drying)?: A Little Help from another person to put on and taking off regular upper body clothing?: None Help from another person to put on and taking off regular lower body clothing?: A Little 6 Click Score: 20    End of Session    OT Visit Diagnosis: Unsteadiness on feet (R26.81);Other abnormalities of gait and mobility (R26.89)   Activity Tolerance Patient tolerated treatment well   Patient Left in bed;with call bell/phone within reach;with family/visitor present   Nurse Communication Mobility status        Time: 1120-1140 OT Time Calculation (min): 20 min  Charges: OT General Charges $OT Visit: 1 Visit OT Treatments $Self Care/Home Management : 8-22 mins    Denzil Bristol A Shoshanah Dapper 07/29/2021, 11:32 AM

## 2021-07-30 LAB — SURGICAL PATHOLOGY

## 2021-08-02 ENCOUNTER — Other Ambulatory Visit: Payer: Self-pay

## 2021-08-02 DIAGNOSIS — D649 Anemia, unspecified: Secondary | ICD-10-CM

## 2021-08-02 DIAGNOSIS — Z7902 Long term (current) use of antithrombotics/antiplatelets: Secondary | ICD-10-CM

## 2021-08-02 DIAGNOSIS — I480 Paroxysmal atrial fibrillation: Secondary | ICD-10-CM

## 2021-08-07 ENCOUNTER — Telehealth: Payer: Self-pay | Admitting: Gastroenterology

## 2021-08-07 NOTE — Telephone Encounter (Signed)
Left message on machine to call back  

## 2021-08-07 NOTE — Telephone Encounter (Signed)
Patient had procedure done by Dr. Havery Moros in the hospital on 12/16.  She has not heard from the results of the biopsies that were taken.  Please call patient and advise.  Thank you.

## 2021-08-08 NOTE — Telephone Encounter (Signed)
Called and spoke with patient in regards to her pathology results. Pt advised that Dr. Loletha Carrow would like her to come in for labs this week. Pt states that if she can't get by the lab today then she will come tomorrow. Pt is aware that once Dr. Loletha Carrow reviews her lab results he will further advise. Pt is aware that she will need a repeat colonoscopy in 3 years. Pt verbalized understanding and had no concerns at the end of the call.

## 2021-08-09 ENCOUNTER — Other Ambulatory Visit (INDEPENDENT_AMBULATORY_CARE_PROVIDER_SITE_OTHER): Payer: Medicare Other

## 2021-08-09 DIAGNOSIS — I480 Paroxysmal atrial fibrillation: Secondary | ICD-10-CM | POA: Diagnosis not present

## 2021-08-09 DIAGNOSIS — D649 Anemia, unspecified: Secondary | ICD-10-CM | POA: Diagnosis not present

## 2021-08-09 DIAGNOSIS — Z7902 Long term (current) use of antithrombotics/antiplatelets: Secondary | ICD-10-CM | POA: Diagnosis not present

## 2021-08-09 LAB — CBC WITH DIFFERENTIAL/PLATELET
Basophils Absolute: 0.1 10*3/uL (ref 0.0–0.1)
Basophils Relative: 1.1 % (ref 0.0–3.0)
Eosinophils Absolute: 0 10*3/uL (ref 0.0–0.7)
Eosinophils Relative: 0 % (ref 0.0–5.0)
HCT: 39 % (ref 36.0–46.0)
Hemoglobin: 12.4 g/dL (ref 12.0–15.0)
Lymphocytes Relative: 15.9 % (ref 12.0–46.0)
Lymphs Abs: 1.8 10*3/uL (ref 0.7–4.0)
MCHC: 31.7 g/dL (ref 30.0–36.0)
MCV: 80.9 fl (ref 78.0–100.0)
Monocytes Absolute: 0.9 10*3/uL (ref 0.1–1.0)
Monocytes Relative: 7.9 % (ref 3.0–12.0)
Neutro Abs: 8.6 10*3/uL — ABNORMAL HIGH (ref 1.4–7.7)
Neutrophils Relative %: 75.1 % (ref 43.0–77.0)
Platelets: 330 10*3/uL (ref 150.0–400.0)
RBC: 4.82 Mil/uL (ref 3.87–5.11)
RDW: 16.8 % — ABNORMAL HIGH (ref 11.5–15.5)
WBC: 11.4 10*3/uL — ABNORMAL HIGH (ref 4.0–10.5)

## 2021-08-17 ENCOUNTER — Inpatient Hospital Stay (HOSPITAL_COMMUNITY)
Admission: EM | Admit: 2021-08-17 | Discharge: 2021-08-20 | DRG: 683 | Disposition: A | Discharge status: Home / Self Care | Payer: Medicare Other | Attending: Internal Medicine | Admitting: Internal Medicine

## 2021-08-17 DIAGNOSIS — I5032 Chronic diastolic (congestive) heart failure: Secondary | ICD-10-CM | POA: Diagnosis present

## 2021-08-17 DIAGNOSIS — N179 Acute kidney failure, unspecified: Principal | ICD-10-CM | POA: Diagnosis present

## 2021-08-17 DIAGNOSIS — E1169 Type 2 diabetes mellitus with other specified complication: Secondary | ICD-10-CM

## 2021-08-17 DIAGNOSIS — I480 Paroxysmal atrial fibrillation: Secondary | ICD-10-CM | POA: Diagnosis present

## 2021-08-17 DIAGNOSIS — G8929 Other chronic pain: Secondary | ICD-10-CM | POA: Diagnosis present

## 2021-08-17 DIAGNOSIS — Z9851 Tubal ligation status: Secondary | ICD-10-CM

## 2021-08-17 DIAGNOSIS — Z7984 Long term (current) use of oral hypoglycemic drugs: Secondary | ICD-10-CM

## 2021-08-17 DIAGNOSIS — W19XXXD Unspecified fall, subsequent encounter: Secondary | ICD-10-CM | POA: Diagnosis present

## 2021-08-17 DIAGNOSIS — Z886 Allergy status to analgesic agent status: Secondary | ICD-10-CM

## 2021-08-17 DIAGNOSIS — M170 Bilateral primary osteoarthritis of knee: Secondary | ICD-10-CM | POA: Diagnosis present

## 2021-08-17 DIAGNOSIS — M797 Fibromyalgia: Secondary | ICD-10-CM | POA: Diagnosis present

## 2021-08-17 DIAGNOSIS — K582 Mixed irritable bowel syndrome: Secondary | ICD-10-CM | POA: Diagnosis present

## 2021-08-17 DIAGNOSIS — E1143 Type 2 diabetes mellitus with diabetic autonomic (poly)neuropathy: Secondary | ICD-10-CM | POA: Diagnosis present

## 2021-08-17 DIAGNOSIS — Z7901 Long term (current) use of anticoagulants: Secondary | ICD-10-CM

## 2021-08-17 DIAGNOSIS — Z981 Arthrodesis status: Secondary | ICD-10-CM

## 2021-08-17 DIAGNOSIS — E785 Hyperlipidemia, unspecified: Secondary | ICD-10-CM | POA: Diagnosis present

## 2021-08-17 DIAGNOSIS — E860 Dehydration: Secondary | ICD-10-CM | POA: Diagnosis present

## 2021-08-17 DIAGNOSIS — N200 Calculus of kidney: Secondary | ICD-10-CM

## 2021-08-17 DIAGNOSIS — Z885 Allergy status to narcotic agent status: Secondary | ICD-10-CM

## 2021-08-17 DIAGNOSIS — Z888 Allergy status to other drugs, medicaments and biological substances status: Secondary | ICD-10-CM

## 2021-08-17 DIAGNOSIS — R7401 Elevation of levels of liver transaminase levels: Secondary | ICD-10-CM | POA: Diagnosis present

## 2021-08-17 DIAGNOSIS — I10 Essential (primary) hypertension: Secondary | ICD-10-CM | POA: Diagnosis present

## 2021-08-17 DIAGNOSIS — K219 Gastro-esophageal reflux disease without esophagitis: Secondary | ICD-10-CM | POA: Diagnosis present

## 2021-08-17 DIAGNOSIS — Z833 Family history of diabetes mellitus: Secondary | ICD-10-CM

## 2021-08-17 DIAGNOSIS — K3184 Gastroparesis: Secondary | ICD-10-CM | POA: Diagnosis present

## 2021-08-17 DIAGNOSIS — G4733 Obstructive sleep apnea (adult) (pediatric): Secondary | ICD-10-CM | POA: Diagnosis present

## 2021-08-17 DIAGNOSIS — E669 Obesity, unspecified: Secondary | ICD-10-CM | POA: Diagnosis present

## 2021-08-17 DIAGNOSIS — I110 Hypertensive heart disease with heart failure: Secondary | ICD-10-CM | POA: Diagnosis present

## 2021-08-17 DIAGNOSIS — Z79899 Other long term (current) drug therapy: Secondary | ICD-10-CM

## 2021-08-17 DIAGNOSIS — E1165 Type 2 diabetes mellitus with hyperglycemia: Secondary | ICD-10-CM | POA: Diagnosis present

## 2021-08-17 DIAGNOSIS — E119 Type 2 diabetes mellitus without complications: Secondary | ICD-10-CM

## 2021-08-17 DIAGNOSIS — Z7985 Long-term (current) use of injectable non-insulin antidiabetic drugs: Secondary | ICD-10-CM

## 2021-08-17 DIAGNOSIS — Z90710 Acquired absence of both cervix and uterus: Secondary | ICD-10-CM

## 2021-08-17 DIAGNOSIS — K760 Fatty (change of) liver, not elsewhere classified: Secondary | ICD-10-CM | POA: Diagnosis present

## 2021-08-17 DIAGNOSIS — Z882 Allergy status to sulfonamides status: Secondary | ICD-10-CM

## 2021-08-17 DIAGNOSIS — R531 Weakness: Secondary | ICD-10-CM

## 2021-08-17 DIAGNOSIS — F32A Depression, unspecified: Secondary | ICD-10-CM | POA: Diagnosis present

## 2021-08-17 DIAGNOSIS — S92351D Displaced fracture of fifth metatarsal bone, right foot, subsequent encounter for fracture with routine healing: Secondary | ICD-10-CM

## 2021-08-17 DIAGNOSIS — K529 Noninfective gastroenteritis and colitis, unspecified: Secondary | ICD-10-CM | POA: Diagnosis present

## 2021-08-17 DIAGNOSIS — F419 Anxiety disorder, unspecified: Secondary | ICD-10-CM | POA: Diagnosis present

## 2021-08-17 DIAGNOSIS — Z8249 Family history of ischemic heart disease and other diseases of the circulatory system: Secondary | ICD-10-CM

## 2021-08-17 DIAGNOSIS — Z794 Long term (current) use of insulin: Secondary | ICD-10-CM

## 2021-08-17 DIAGNOSIS — Z20822 Contact with and (suspected) exposure to covid-19: Secondary | ICD-10-CM | POA: Diagnosis present

## 2021-08-17 DIAGNOSIS — M549 Dorsalgia, unspecified: Secondary | ICD-10-CM | POA: Diagnosis present

## 2021-08-17 DIAGNOSIS — Z6830 Body mass index (BMI) 30.0-30.9, adult: Secondary | ICD-10-CM

## 2021-08-17 LAB — COMPREHENSIVE METABOLIC PANEL
ALT: 65 U/L — ABNORMAL HIGH (ref 0–44)
AST: 58 U/L — ABNORMAL HIGH (ref 15–41)
Albumin: 3.5 g/dL (ref 3.5–5.0)
Alkaline Phosphatase: 184 U/L — ABNORMAL HIGH (ref 38–126)
Anion gap: 12 (ref 5–15)
BUN: 77 mg/dL — ABNORMAL HIGH (ref 8–23)
CO2: 28 mmol/L (ref 22–32)
Calcium: 9.4 mg/dL (ref 8.9–10.3)
Chloride: 96 mmol/L — ABNORMAL LOW (ref 98–111)
Creatinine, Ser: 2.02 mg/dL — ABNORMAL HIGH (ref 0.44–1.00)
GFR, Estimated: 26 mL/min — ABNORMAL LOW (ref >60)
Glucose, Bld: 183 mg/dL — ABNORMAL HIGH (ref 70–99)
Potassium: 4.7 mmol/L (ref 3.5–5.1)
Sodium: 136 mmol/L (ref 135–145)
Total Bilirubin: 0.4 mg/dL (ref 0.3–1.2)
Total Protein: 7.6 g/dL (ref 6.5–8.1)

## 2021-08-17 LAB — CBC WITH DIFFERENTIAL/PLATELET
Abs Immature Granulocytes: 0.03 10*3/uL (ref 0.00–0.07)
Basophils Absolute: 0.1 10*3/uL (ref 0.0–0.1)
Basophils Relative: 1 %
Eosinophils Absolute: 0.2 10*3/uL (ref 0.0–0.5)
Eosinophils Relative: 2 %
HCT: 40.2 % (ref 36.0–46.0)
Hemoglobin: 12.6 g/dL (ref 12.0–15.0)
Immature Granulocytes: 0 %
Lymphocytes Relative: 23 %
Lymphs Abs: 2.2 10*3/uL (ref 0.7–4.0)
MCH: 25.9 pg — ABNORMAL LOW (ref 26.0–34.0)
MCHC: 31.3 g/dL (ref 30.0–36.0)
MCV: 82.5 fL (ref 80.0–100.0)
Monocytes Absolute: 1 10*3/uL (ref 0.1–1.0)
Monocytes Relative: 11 %
Neutro Abs: 5.9 10*3/uL (ref 1.7–7.7)
Neutrophils Relative %: 63 %
Platelets: 193 10*3/uL (ref 150–400)
RBC: 4.87 MIL/uL (ref 3.87–5.11)
RDW: 16.2 % — ABNORMAL HIGH (ref 11.5–15.5)
WBC: 9.4 10*3/uL (ref 4.0–10.5)
nRBC: 0 % (ref 0.0–0.2)

## 2021-08-17 LAB — URINALYSIS, ROUTINE W REFLEX MICROSCOPIC
Bilirubin Urine: NEGATIVE
Glucose, UA: >=500 mg/dL — AB
Hgb urine dipstick: NEGATIVE
Ketones, ur: NEGATIVE mg/dL
Nitrite: NEGATIVE
Protein, ur: NEGATIVE mg/dL
Specific Gravity, Urine: 1.02 (ref 1.005–1.030)
pH: 5.5 (ref 5.0–8.0)

## 2021-08-17 LAB — URINALYSIS, MICROSCOPIC (REFLEX)

## 2021-08-17 LAB — LIPASE, BLOOD: Lipase: 84 U/L — ABNORMAL HIGH (ref 11–51)

## 2021-08-17 NOTE — ED Triage Notes (Signed)
Pt here POV d/t PCP sending here to ED. Abnormal labs. Pt endorses dizziness X4 weeks. Has had multiple falls.

## 2021-08-17 NOTE — ED Provider Triage Note (Signed)
Emergency Medicine Provider Triage Evaluation Note  Alexis [name] , a 71 y.o. female  was evaluated in triage.  Pt complains of normal labs.  Patient states that she went to her primary care today to have labs for a future appointment.  She states that a few hours later they called her back and stated that she had abnormal kidney and liver labs and to present to the emergency department.  She denies history of kidney or liver disease.  She does state that for the past few weeks she has had upper abdominal discomfort and nausea without vomiting.  However she thought this was related to her recent colonoscopy she had where she had polyps clamped off(?).  She denies hematuria or decreased urine output.  Denies fevers or weight loss.  Review of Systems  Positive: See above Negative:   Physical Exam  BP (!) 113/51 (BP Location: Left Arm)    Pulse (!) 58    Temp 98.8 F (37.1 C) (Oral)    Resp 16    SpO2 100%  Gen:   Awake, no distress   Resp:  Normal effort  MSK:   Moves extremities without difficulty  Other:  Abdomen is rounded, soft, tender to palpation of the epigastric and right upper quadrant regions  Medical Decision Making  Medically screening exam initiated at 4:48 PM.  Appropriate orders placed.  Alexis [name] was informed that the remainder of the evaluation will be completed by another provider, this initial triage assessment does not replace that evaluation, and the importance of remaining in the ED until their evaluation is complete.     Alexis Hillier, PA-C 08/17/21 1649

## 2021-08-18 ENCOUNTER — Emergency Department (HOSPITAL_COMMUNITY): Payer: Medicare Other

## 2021-08-18 DIAGNOSIS — W19XXXD Unspecified fall, subsequent encounter: Secondary | ICD-10-CM | POA: Diagnosis present

## 2021-08-18 DIAGNOSIS — E669 Obesity, unspecified: Secondary | ICD-10-CM | POA: Diagnosis present

## 2021-08-18 DIAGNOSIS — I5032 Chronic diastolic (congestive) heart failure: Secondary | ICD-10-CM | POA: Diagnosis present

## 2021-08-18 DIAGNOSIS — M170 Bilateral primary osteoarthritis of knee: Secondary | ICD-10-CM | POA: Diagnosis present

## 2021-08-18 DIAGNOSIS — R7401 Elevation of levels of liver transaminase levels: Secondary | ICD-10-CM

## 2021-08-18 DIAGNOSIS — Z7901 Long term (current) use of anticoagulants: Secondary | ICD-10-CM | POA: Diagnosis not present

## 2021-08-18 DIAGNOSIS — K529 Noninfective gastroenteritis and colitis, unspecified: Secondary | ICD-10-CM | POA: Diagnosis not present

## 2021-08-18 DIAGNOSIS — E785 Hyperlipidemia, unspecified: Secondary | ICD-10-CM | POA: Diagnosis present

## 2021-08-18 DIAGNOSIS — F32A Depression, unspecified: Secondary | ICD-10-CM | POA: Diagnosis present

## 2021-08-18 DIAGNOSIS — E1165 Type 2 diabetes mellitus with hyperglycemia: Secondary | ICD-10-CM | POA: Diagnosis present

## 2021-08-18 DIAGNOSIS — K3184 Gastroparesis: Secondary | ICD-10-CM | POA: Diagnosis present

## 2021-08-18 DIAGNOSIS — I480 Paroxysmal atrial fibrillation: Secondary | ICD-10-CM | POA: Diagnosis present

## 2021-08-18 DIAGNOSIS — M797 Fibromyalgia: Secondary | ICD-10-CM | POA: Diagnosis present

## 2021-08-18 DIAGNOSIS — N200 Calculus of kidney: Secondary | ICD-10-CM | POA: Diagnosis present

## 2021-08-18 DIAGNOSIS — K760 Fatty (change of) liver, not elsewhere classified: Secondary | ICD-10-CM | POA: Diagnosis present

## 2021-08-18 DIAGNOSIS — K219 Gastro-esophageal reflux disease without esophagitis: Secondary | ICD-10-CM | POA: Diagnosis present

## 2021-08-18 DIAGNOSIS — K5900 Constipation, unspecified: Secondary | ICD-10-CM | POA: Diagnosis not present

## 2021-08-18 DIAGNOSIS — Z20822 Contact with and (suspected) exposure to covid-19: Secondary | ICD-10-CM | POA: Diagnosis present

## 2021-08-18 DIAGNOSIS — I10 Essential (primary) hypertension: Secondary | ICD-10-CM

## 2021-08-18 DIAGNOSIS — E1140 Type 2 diabetes mellitus with diabetic neuropathy, unspecified: Secondary | ICD-10-CM

## 2021-08-18 DIAGNOSIS — Z794 Long term (current) use of insulin: Secondary | ICD-10-CM

## 2021-08-18 DIAGNOSIS — E860 Dehydration: Secondary | ICD-10-CM | POA: Diagnosis present

## 2021-08-18 DIAGNOSIS — S92351D Displaced fracture of fifth metatarsal bone, right foot, subsequent encounter for fracture with routine healing: Secondary | ICD-10-CM | POA: Diagnosis not present

## 2021-08-18 DIAGNOSIS — N179 Acute kidney failure, unspecified: Principal | ICD-10-CM

## 2021-08-18 DIAGNOSIS — Z6830 Body mass index (BMI) 30.0-30.9, adult: Secondary | ICD-10-CM | POA: Diagnosis not present

## 2021-08-18 DIAGNOSIS — F419 Anxiety disorder, unspecified: Secondary | ICD-10-CM | POA: Diagnosis present

## 2021-08-18 DIAGNOSIS — I110 Hypertensive heart disease with heart failure: Secondary | ICD-10-CM | POA: Diagnosis present

## 2021-08-18 DIAGNOSIS — K582 Mixed irritable bowel syndrome: Secondary | ICD-10-CM | POA: Diagnosis present

## 2021-08-18 DIAGNOSIS — G8929 Other chronic pain: Secondary | ICD-10-CM | POA: Diagnosis present

## 2021-08-18 DIAGNOSIS — E1143 Type 2 diabetes mellitus with diabetic autonomic (poly)neuropathy: Secondary | ICD-10-CM | POA: Diagnosis present

## 2021-08-18 DIAGNOSIS — G4733 Obstructive sleep apnea (adult) (pediatric): Secondary | ICD-10-CM | POA: Diagnosis present

## 2021-08-18 DIAGNOSIS — M549 Dorsalgia, unspecified: Secondary | ICD-10-CM | POA: Diagnosis present

## 2021-08-18 LAB — CBG MONITORING, ED
Glucose-Capillary: 229 mg/dL — ABNORMAL HIGH (ref 70–99)
Glucose-Capillary: 206 mg/dL — ABNORMAL HIGH (ref 70–99)

## 2021-08-18 LAB — COMPREHENSIVE METABOLIC PANEL
ALT: 57 U/L — ABNORMAL HIGH (ref 0–44)
AST: 47 U/L — ABNORMAL HIGH (ref 15–41)
Albumin: 3.2 g/dL — ABNORMAL LOW (ref 3.5–5.0)
Alkaline Phosphatase: 164 U/L — ABNORMAL HIGH (ref 38–126)
Anion gap: 11 (ref 5–15)
BUN: 83 mg/dL — ABNORMAL HIGH (ref 8–23)
CO2: 26 mmol/L (ref 22–32)
Calcium: 8.9 mg/dL (ref 8.9–10.3)
Chloride: 99 mmol/L (ref 98–111)
Creatinine, Ser: 2.05 mg/dL — ABNORMAL HIGH (ref 0.44–1.00)
GFR, Estimated: 26 mL/min — ABNORMAL LOW (ref >60)
Glucose, Bld: 259 mg/dL — ABNORMAL HIGH (ref 70–99)
Potassium: 4.6 mmol/L (ref 3.5–5.1)
Sodium: 136 mmol/L (ref 135–145)
Total Bilirubin: 0.3 mg/dL (ref 0.3–1.2)
Total Protein: 6.8 g/dL (ref 6.5–8.1)

## 2021-08-18 LAB — CBC
HCT: 38.2 % (ref 36.0–46.0)
Hemoglobin: 11.9 g/dL — ABNORMAL LOW (ref 12.0–15.0)
MCH: 26.1 pg (ref 26.0–34.0)
MCHC: 31.2 g/dL (ref 30.0–36.0)
MCV: 83.8 fL (ref 80.0–100.0)
Platelets: 169 10*3/uL (ref 150–400)
RBC: 4.56 MIL/uL (ref 3.87–5.11)
RDW: 16.4 % — ABNORMAL HIGH (ref 11.5–15.5)
WBC: 8.8 10*3/uL (ref 4.0–10.5)
nRBC: 0 % (ref 0.0–0.2)

## 2021-08-18 LAB — CK: Total CK: 67 U/L (ref 38–234)

## 2021-08-18 LAB — GLUCOSE, CAPILLARY: Glucose-Capillary: 210 mg/dL — ABNORMAL HIGH (ref 70–99)

## 2021-08-18 LAB — HEMOGLOBIN A1C
Hgb A1c MFr Bld: 8.8 % — ABNORMAL HIGH (ref 4.8–5.6)
Mean Plasma Glucose: 205.86 mg/dL

## 2021-08-18 LAB — RESP PANEL BY RT-PCR (FLU A&B, COVID) ARPGX2
Influenza A by PCR: NEGATIVE
Influenza B by PCR: NEGATIVE
SARS Coronavirus 2 by RT PCR: NEGATIVE

## 2021-08-18 MED ORDER — PREGABALIN 50 MG PO CAPS
50.0000 mg | ORAL_CAPSULE | Freq: Two times a day (BID) | ORAL | Status: DC
  Administered 2021-08-18 – 2021-08-20 (×5): 50 mg via ORAL
  Filled 2021-08-18 (×4): qty 1

## 2021-08-18 MED ORDER — SODIUM CHLORIDE 0.9 % IV SOLN: INTRAVENOUS | Status: DC

## 2021-08-18 MED ORDER — AMIODARONE HCL 200 MG PO TABS
200.0000 mg | ORAL_TABLET | Freq: Two times a day (BID) | ORAL | Status: DC
  Administered 2021-08-18 – 2021-08-20 (×5): 200 mg via ORAL
  Filled 2021-08-18 (×5): qty 1

## 2021-08-18 MED ORDER — SODIUM CHLORIDE 0.9 % IV BOLUS
1000.0000 mL | Freq: Once | INTRAVENOUS | Status: AC
  Administered 2021-08-18: 1000 mL via INTRAVENOUS

## 2021-08-18 MED ORDER — ONDANSETRON HCL 4 MG/2ML IJ SOLN: 4.0000 mg | Freq: Four times a day (QID) | INTRAMUSCULAR | Status: DC | PRN

## 2021-08-18 MED ORDER — ONDANSETRON HCL 4 MG PO TABS: 4.0000 mg | ORAL_TABLET | Freq: Four times a day (QID) | ORAL | Status: DC | PRN

## 2021-08-18 MED ORDER — ALBUTEROL SULFATE (2.5 MG/3ML) 0.083% IN NEBU: 2.5000 mg | INHALATION_SOLUTION | Freq: Four times a day (QID) | RESPIRATORY_TRACT | Status: DC | PRN

## 2021-08-18 MED ORDER — ACETAMINOPHEN 325 MG PO TABS
650.0000 mg | ORAL_TABLET | Freq: Four times a day (QID) | ORAL | Status: DC | PRN
  Administered 2021-08-19 – 2021-08-20 (×3): 650 mg via ORAL
  Filled 2021-08-18 (×3): qty 2

## 2021-08-18 MED ORDER — PANTOPRAZOLE SODIUM 40 MG PO TBEC
40.0000 mg | DELAYED_RELEASE_TABLET | Freq: Two times a day (BID) | ORAL | Status: DC
  Administered 2021-08-18 – 2021-08-20 (×5): 40 mg via ORAL
  Filled 2021-08-18 (×5): qty 1

## 2021-08-18 MED ORDER — TAMSULOSIN HCL 0.4 MG PO CAPS
0.4000 mg | ORAL_CAPSULE | Freq: Every day | ORAL | Status: DC
  Administered 2021-08-18 – 2021-08-19 (×2): 0.4 mg via ORAL
  Filled 2021-08-18 (×2): qty 1

## 2021-08-18 MED ORDER — INSULIN GLARGINE-YFGN 100 UNIT/ML ~~LOC~~ SOLN
20.0000 [IU] | Freq: Every day | SUBCUTANEOUS | Status: DC
  Administered 2021-08-19 (×2): 20 [IU] via SUBCUTANEOUS
  Filled 2021-08-18 (×5): qty 0.2

## 2021-08-18 MED ORDER — ACETAMINOPHEN 650 MG RE SUPP: 650.0000 mg | Freq: Four times a day (QID) | RECTAL | Status: DC | PRN

## 2021-08-18 MED ORDER — INSULIN ASPART 100 UNIT/ML IJ SOLN
0.0000 [IU] | Freq: Three times a day (TID) | INTRAMUSCULAR | Status: DC
  Administered 2021-08-18 (×2): 5 [IU] via SUBCUTANEOUS
  Administered 2021-08-19: 3 [IU] via SUBCUTANEOUS
  Administered 2021-08-19: 5 [IU] via SUBCUTANEOUS
  Administered 2021-08-19: 1 [IU] via SUBCUTANEOUS
  Administered 2021-08-20: 5 [IU] via SUBCUTANEOUS

## 2021-08-18 MED ORDER — SODIUM CHLORIDE 0.9% FLUSH
3.0000 mL | Freq: Two times a day (BID) | INTRAVENOUS | Status: DC
  Administered 2021-08-20: 3 mL via INTRAVENOUS

## 2021-08-18 MED ORDER — APIXABAN 5 MG PO TABS
5.0000 mg | ORAL_TABLET | Freq: Two times a day (BID) | ORAL | Status: DC
  Administered 2021-08-18 – 2021-08-20 (×5): 5 mg via ORAL
  Filled 2021-08-18 (×5): qty 1

## 2021-08-18 MED ORDER — VENLAFAXINE HCL ER 150 MG PO CP24
150.0000 mg | ORAL_CAPSULE | Freq: Two times a day (BID) | ORAL | Status: DC
  Administered 2021-08-19 – 2021-08-20 (×4): 150 mg via ORAL
  Filled 2021-08-18 (×7): qty 1

## 2021-08-18 MED ORDER — ATORVASTATIN CALCIUM 10 MG PO TABS
20.0000 mg | ORAL_TABLET | Freq: Every day | ORAL | Status: DC
  Administered 2021-08-18 – 2021-08-20 (×3): 20 mg via ORAL
  Filled 2021-08-18 (×3): qty 2

## 2021-08-18 MED ORDER — ALPRAZOLAM 0.5 MG PO TABS
0.5000 mg | ORAL_TABLET | Freq: Every evening | ORAL | Status: DC | PRN
  Administered 2021-08-18 – 2021-08-19 (×2): 0.5 mg via ORAL
  Filled 2021-08-18 (×2): qty 1

## 2021-08-18 MED ORDER — ONDANSETRON HCL 4 MG/2ML IJ SOLN
4.0000 mg | Freq: Once | INTRAMUSCULAR | Status: AC
  Administered 2021-08-18: 4 mg via INTRAVENOUS
  Filled 2021-08-18: qty 2

## 2021-08-18 MED ORDER — ZOLPIDEM TARTRATE 5 MG PO TABS: 5.0000 mg | ORAL_TABLET | Freq: Every evening | ORAL | Status: DC | PRN

## 2021-08-18 MED ORDER — METOPROLOL SUCCINATE ER 50 MG PO TB24
50.0000 mg | ORAL_TABLET | Freq: Every day | ORAL | Status: DC
  Administered 2021-08-18 – 2021-08-19 (×2): 50 mg via ORAL
  Filled 2021-08-18 (×3): qty 1

## 2021-08-18 NOTE — ED Notes (Signed)
Breakfast meal given to patient.

## 2021-08-18 NOTE — H&P (Signed)
History and Physical    Alexis [name] QJJ:[id number] DOB: [birth year] DOA: 08/17/2021  Referring MD/NP/PA: Gean Birchwood, MD PCP: Deland Pretty, MD  Patient coming from: Home  Chief Complaint: Kidney failure  I have personally briefly reviewed patient's old medical records in [place]   HPI: Alexis [name] is a 71 y.o. female with medical history significant of HTN, HLD, diastolic CHF, A. fib, DM type II with gastroparesis, fatty liver disease, IBS, GERD, and OSA presents  after having lab work obtained prior to her upcoming routine physical and being instructed to come to the hospital for further evaluation due to signs of kidney failure.  Just recently hospitalized from 12/13-12/18/2022 presenting with shortness of breath diagnosed with a diastolic congestive heart failure exacerbation.  EF noted to be 65 - 70% with grade 2 diastolic dysfunction by echocardiogram.  She was diuresed with IV Lasix.  Cardiology had been consulted and ultimately patient had been switched from furosemide 20 mg to torsemide 20 mg daily with spironolactone 25 mg daily.  During hospitalization patient was also noted to have acute blood loss anemia requiring 1 unit of packed red blood cells.  Evaluated by by Dr. Havery Moros of GI and underwent EGD noting a large inflamed gastric polyp for which biopsies were taken.  Colonoscopy revealed multiple polyps which were removed and internal hemorrhoids patient was recommended to hold Eliquis for 3 days prior to restarting it.  Since being discharged from the hospital patient reports that she has been taking all the medications as prescribed.  Does note that she has been dizzy and feeling like she is losing her balance especially when getting up and moving around.  Other than that she reports having some nausea for which makes her feel like she is going to vomit but has not had any episodes of vomiting.  She admits that since being here at the hospital she fell while  her husband was trying to get her to the restroom.  She did not lose consciousness or hurt anything to her knowledge.  Patient has been dealing with diarrhea and  notes stools had been intermittently dark in color, but she denies seeing any blood to her knowledge.  She also states that she had been urinating frequently due to the diuretics, but over the last couple days her urine output had decreased some.  Denies having any fever, palpitations, chest pain, leg swelling, or shortness of breath.    ED Course: Upon admission into the emergency department patient was noted to be afebrile, pulse 55-62, blood pressure 102/53 ~136/62, and all other vital signs maintained.  Labs from 1/6 significant for BUN 77, creatinine 2.02(creatinine previously 0.77 on 12/18), glucose 183, alkaline phosphatase 184, lipase 84, AST 58, and ALT 65.  Urinalysis significant for glucose, small leukocytes, rare bacteria, and 21-50 WBCs.  CT of the head showed no acute abnormalities with mild atrophy and chronic microvascular ischemic changes.  CT scan of the abdomen and pelvis noted moderate amount of retained stool in the colon suggesting constipation, cardiomegaly with coronary artery calcifications, aortic atherosclerosis, and nonobstructing right renal calculi.  Patient had been given 1 L normal saline IV fluids and Zofran.  TRH called to admit.  Review of Systems  Constitutional:  Positive for malaise/fatigue. Negative for fever.  Eyes:  Negative for photophobia and pain.  Respiratory:  Negative for cough and shortness of breath.   Cardiovascular:  Negative for chest pain, palpitations and leg swelling.  Gastrointestinal:  Positive for melena and  nausea. Negative for abdominal pain and vomiting.  Genitourinary:  Positive for frequency. Negative for dysuria.  Musculoskeletal:  Positive for falls.  Neurological:  Positive for dizziness. Negative for loss of consciousness.  Psychiatric/Behavioral:  Negative for substance  abuse.    Past Medical History:  Diagnosis Date   Anxiety    Arthritis    "knees" (05/25/2014)   Chronic back pain    Depression    Diabetes mellitus type II    Diabetic peripheral neuropathy ([place]) 10/20/2018   Diverticulitis    Fatty liver    Fibromyalgia    Gastric polyp    hyperplastic   Gastroparesis    "recently dx'd" (05/25/2014)   GERD (gastroesophageal reflux disease)    H/O hiatal hernia    Hx of gastritis    Hyperlipidemia    Hypertension    Irritable bowel syndrome (IBS)    Memory difficulties 09/18/2017   Migraine without aura, without mention of intractable migraine without mention of status migrainosus    "related to allergies; have them in the spring and fall" (05/25/2014)   Obesity    Sleep apnea    "wore mask; took it off in my sleep; quit wearing it" (05/25/2014)   Uterine cancer ([place]) dx'd 2000   surg only   Vasculitis ([place])    "irritates my legs"   Wears glasses     Past Surgical History:  Procedure Laterality Date   ANTERIOR CERVICAL DECOMPRESSION/DISCECTOMY FUSION 4 LEVELS N/A 05/07/2018   Procedure: ANTERIOR CERVICAL DECOMPRESSION FUSION, CERVICAL 4-5,CERVICAL 5-6, CERVICAL 6-7 WITH INSTRUMENTATION AND ALLOGRAFT;  Surgeon: Phylliss Bob, MD;  Location: [place];  Service: Orthopedics;  Laterality: N/A;   APPENDECTOMY  ~ 1967   BIOPSY  07/27/2021   Procedure: BIOPSY;  Surgeon: Yetta Flock, MD;  Location: [place];  Service: Gastroenterology;;   BREAST CYST EXCISION Right 1990   COLONOSCOPY     COLONOSCOPY WITH PROPOFOL N/A 07/27/2021   Procedure: COLONOSCOPY WITH PROPOFOL;  Surgeon: Yetta Flock, MD;  Location: [place];  Service: Gastroenterology;  Laterality: N/A;   ESOPHAGOGASTRODUODENOSCOPY (EGD) WITH PROPOFOL N/A 07/27/2021   Procedure: ESOPHAGOGASTRODUODENOSCOPY (EGD) WITH PROPOFOL;  Surgeon: Yetta Flock, MD;  Location: [place];  Service: Gastroenterology;  Laterality: N/A;   HEMOSTASIS CLIP PLACEMENT   07/27/2021   Procedure: HEMOSTASIS CLIP PLACEMENT;  Surgeon: Yetta Flock, MD;  Location: [facility] ENDOSCOPY;  Service: Gastroenterology;;   [place] WITH CORONARY ANGIOGRAM N/A 05/26/2014   Procedure: LEFT HEART CATHETERIZATION WITH CORONARY ANGIOGRAM;  Surgeon: Troy Sine, MD;  Location[facility] CATH LAB;  Service: Cardiovascular;  Laterality: N/A;   POLYPECTOMY  07/27/2021   Procedure: POLYPECTOMY;  Surgeon: Yetta Flock, MD;  Location: [place];  Service: Gastroenterology;;   Clide Deutscher  07/27/2021   Procedure: Clide Deutscher;  Surgeon: Yetta Flock, MD;  Location: [facility] ENDOSCOPY;  Service: Gastroenterology;;   SINUS SURGERY WITH INSTATRAK  2000   TOTAL HIP ARTHROPLASTY Left 12/05/2020   Procedure: TOTAL HIP ARTHROPLASTY ANTERIOR APPROACH;  Surgeon: Rod Can, MD;  Location: [place];  Service: Orthopedics;  Laterality: Left;   TUBAL LIGATION  ~ [place]  2000     reports that she has never smoked. She has never used smokeless tobacco. She reports that she does not drink alcohol and does not use drugs.  Allergies  Allergen Reactions   Aspirin Other (See Comments)    Reaction: Vasculitis per MD   Codeine Itching and  Rash    Tolerated Norco 12/07/20 with no reports of rash/itching. Patient endorsed allergy >30 yr ago and hasn't had reaction since.   Naproxen Nausea Only    Reaction: Makes stomach upset/irritated.   Sulfonamide Derivatives Itching and Rash    Family History  Problem Relation Age of Onset   Breast cancer Sister    Colon polyps Father    Heart disease Father    Colon cancer Maternal Uncle    Ovarian cancer Maternal Aunt    Stomach cancer Maternal Aunt    Diabetes Maternal Aunt    Heart disease Maternal Uncle    Heart disease Other        Grandparents   Irritable bowel syndrome Daughter     Prior to Admission medications   Medication Sig Start Date End Date  Taking? Authorizing Provider  acetaminophen (TYLENOL) 325 MG tablet Take 2 tablets (650 mg total) by mouth every 6 (six) hours as needed for mild pain or moderate pain. 12/12/20  Yes Nolberto Hanlon, MD  ALPRAZolam Duanne Moron) 0.5 MG tablet Take 0.5 mg by mouth at bedtime.   Yes [provider]  amiodarone (PACERONE) 200 MG tablet Take 1 tablet (200 mg total) by mouth daily. Patient taking differently: Take 200 mg by mouth 2 (two) times daily. 05/14/21  Yes Hilty, Nadean Corwin, MD  apixaban (ELIQUIS) 5 MG TABS tablet Take 1 tablet (5 mg total) by mouth 2 (two) times daily. 07/31/21  Yes Hosie Poisson, MD  atorvastatin (LIPITOR) 20 MG tablet Take 1 tablet (20 mg total) by mouth daily. 07/29/21  Yes Hosie Poisson, MD  b complex vitamins tablet Take 1 tablet by mouth at bedtime.   Yes [provider]  FARXIGA 10 MG TABS tablet Take 10 mg by mouth at bedtime. 09/10/19  Yes [provider]  fish oil-omega-3 fatty acids 1000 MG capsule Take 1 g by mouth at bedtime.    Yes [provider]  insulin lispro (HUMALOG) 100 UNIT/ML KwikPen Inject 8 Units into the skin in the morning and at bedtime.   Yes [provider]  metFORMIN (GLUCOPHAGE-XR) 500 MG 24 hr tablet Take 500 mg by mouth at bedtime. 08/24/19  Yes [provider]  metoprolol succinate (TOPROL-XL) 50 MG 24 hr tablet Take 1 tablet (50 mg total) by mouth daily. Take with or immediately following a meal. Patient taking differently: Take 50 mg by mouth at bedtime. Take with or immediately following a meal. 07/29/21  Yes Hosie Poisson, MD  pantoprazole (PROTONIX) 40 MG tablet Take 1 tablet (40 mg total) by mouth 2 (two) times daily. 07/29/21  Yes Hosie Poisson, MD  pregabalin (LYRICA) 50 MG capsule Take 50 mg by mouth 2 (two) times daily.   Yes [provider]  spironolactone (ALDACTONE) 25 MG tablet Take 1 tablet (25 mg total) by mouth daily. 07/29/21  Yes Hosie Poisson, MD  tamsulosin (FLOMAX) 0.4 MG  CAPS capsule Take 0.4 mg by mouth at bedtime. 07/17/16  Yes [provider]  torsemide (DEMADEX) 20 MG tablet Take 1 tablet (20 mg total) by mouth daily. 07/29/21  Yes Hosie Poisson, MD  TRESIBA FLEXTOUCH 100 UNIT/ML FlexTouch Pen Inject 20 Units into the skin at bedtime. 09/29/19  Yes [provider]  TRULICITY 1.5 ZD/6.6YQ SOPN Inject 1.5 mg into the skin every Sunday. 05/07/21  Yes [provider]  valsartan (DIOVAN) 320 MG tablet Take 320 mg by mouth at bedtime.   Yes [provider]  venlafaxine XR (EFFEXOR-XR) 150  MG 24 hr capsule Take 150 mg by mouth 2 (two) times daily.   Yes [provider]  zaleplon (SONATA) 5 MG capsule Take 5 mg by mouth at bedtime.   Yes [provider]    Physical Exam:  Constitutional: Elderly female who appears to be in no acute distress at this time Vitals:   08/18/21 0243 08/18/21 0445 08/18/21 0500 08/18/21 0530  BP: 136/62 116/68 110/61 117/78  Pulse: (!) 55 (!) 58 (!) 57 61  Resp: 18 17 16    Temp:      TempSrc:      SpO2: 99% 99% 98% 99%   Eyes: PERRL, lids and conjunctivae normal ENMT: Mucous membranes are moist. Posterior pharynx clear of any exudate or lesions.  Neck: normal, supple, no masses, no thyromegaly Respiratory: clear to auscultation bilaterally, no wheezing, no crackles. Normal respiratory effort. No accessory muscle use.  Cardiovascular: Bradycardic, no murmurs / rubs / gallops. No extremity edema.  Abdomen: no tenderness, no masses palpated. No hepatosplenomegaly. Bowel sounds positive.  Musculoskeletal: no clubbing / cyanosis.  Skin: no rashes, lesions, ulcers. No induration Neurologic: CN 2-12 grossly intact.  Able to move all extremities. Psychiatric: Normal judgment and insight. Alert and oriented x 3. Normal mood.     Labs on Admission: I have personally reviewed following labs and imaging studies  CBC: Recent Labs  Lab 08/17/21 1658  WBC 9.4  NEUTROABS 5.9  HGB 12.6   HCT 40.2  MCV 82.5  PLT 811   Basic Metabolic Panel: Recent Labs  Lab 08/17/21 1658  NA 136  K 4.7  CL 96*  CO2 28  GLUCOSE 183*  BUN 77*  CREATININE 2.02*  CALCIUM 9.4   GFR: CrCl cannot be calculated (Unknown ideal weight.). Liver Function Tests: Recent Labs  Lab 08/17/21 1658  AST 58*  ALT 65*  ALKPHOS 184*  BILITOT 0.4  PROT 7.6  ALBUMIN 3.5   Recent Labs  Lab 08/17/21 1658  LIPASE 84*   No results for input(s): AMMONIA in the last 168 hours. Coagulation Profile: No results for input(s): INR, PROTIME in the last 168 hours. Cardiac Enzymes: No results for input(s): CKTOTAL, CKMB, CKMBINDEX, TROPONINI in the last 168 hours. BNP (last 3 results) No results for input(s): PROBNP in the last 8760 hours. HbA1C: No results for input(s): HGBA1C in the last 72 hours. CBG: No results for input(s): GLUCAP in the last 168 hours. Lipid Profile: No results for input(s): CHOL, HDL, LDLCALC, TRIG, CHOLHDL, LDLDIRECT in the last 72 hours. Thyroid Function Tests: No results for input(s): TSH, T4TOTAL, FREET4, T3FREE, THYROIDAB in the last 72 hours. Anemia Panel: No results for input(s): VITAMINB12, FOLATE, FERRITIN, TIBC, IRON, RETICCTPCT in the last 72 hours. Urine analysis:    Component Value Date/Time   COLORURINE YELLOW 08/17/2021 1651   APPEARANCEUR CLEAR 08/17/2021 1651   LABSPEC 1.020 08/17/2021 1651   PHURINE 5.5 08/17/2021 1651   GLUCOSEU >=500 (A) 08/17/2021 1651   HGBUR NEGATIVE 08/17/2021 1651   BILIRUBINUR NEGATIVE 08/17/2021 1651   KETONESUR NEGATIVE 08/17/2021 1651   PROTEINUR NEGATIVE 08/17/2021 1651   UROBILINOGEN 0.2 02/09/2014 1603   NITRITE NEGATIVE 08/17/2021 1651   LEUKOCYTESUR SMALL (A) 08/17/2021 1651   Sepsis Labs: Recent Results (from the past 240 hour(s))  Resp Panel by RT-PCR (Flu A&B, Covid) Nasopharyngeal Swab     Status: None   Collection Time: 08/18/21  2:47 AM   Specimen: Nasopharyngeal Swab; Nasopharyngeal(NP) swabs in vial  transport medium  Result Value Ref  Range Status   SARS Coronavirus 2 by RT PCR NEGATIVE NEGATIVE Final    Comment: (NOTE) SARS-CoV-2 target nucleic acids are NOT DETECTED.  The SARS-CoV-2 RNA is generally detectable in upper respiratory specimens during the acute phase of infection. The lowest concentration of SARS-CoV-2 viral copies this assay can detect is 138 copies/mL. A negative result does not preclude SARS-Cov-2 infection and should not be used as the sole basis for treatment or other patient management decisions. A negative result may occur with  improper specimen collection/handling, submission of specimen other than nasopharyngeal swab, presence of viral mutation(s) within the areas targeted by this assay, and inadequate number of viral copies(<138 copies/mL). A negative result must be combined with clinical observations, patient history, and epidemiological information. The expected result is Negative.  Fact Sheet for Patients:  EntrepreneurPulse.com.au  Fact Sheet for Healthcare Providers:  IncredibleEmployment.be  This test is no t yet approved or cleared by the [place] FDA and  has been authorized for detection and/or diagnosis of SARS-CoV-2 by FDA under an Emergency Use Authorization (EUA). This EUA will remain  in effect (meaning this test can be used) for the duration of the COVID-19 declaration under Section 564(b)(1) of the Act, 21 U.S.C.section 360bbb-3(b)(1), unless the authorization is terminated  or revoked sooner.       Influenza A by PCR NEGATIVE NEGATIVE Final   Influenza B by PCR NEGATIVE NEGATIVE Final    Comment: (NOTE) The Xpert Xpress SARS-CoV-2/FLU/RSV plus assay is intended as an aid in the diagnosis of influenza from Nasopharyngeal swab specimens and should not be used as a sole basis for treatment. Nasal washings and aspirates are unacceptable for Xpert Xpress SARS-CoV-2/FLU/RSV testing.  Fact  Sheet for Patients: EntrepreneurPulse.com.au  Fact Sheet for Healthcare Providers: IncredibleEmployment.be  This test is not yet approved or cleared by the [place] FDA and has been authorized for detection and/or diagnosis of SARS-CoV-2 by FDA under an Emergency Use Authorization (EUA). This EUA will remain in effect (meaning this test can be used) for the duration of the COVID-19 declaration under Section 564(b)(1) of the Act, 21 U.S.C. section 360bbb-3(b)(1), unless the authorization is terminated or revoked.  Performed at [facility], [place] [address], [place], [place] [address]      Radiological Exams on Admission: CT ABDOMEN PELVIS WO CONTRAST  Result Date: 08/18/2021 CLINICAL DATA:  Abdominal pain and nausea, recent colonoscopy with biopsy. EXAM: CT ABDOMEN AND PELVIS WITHOUT CONTRAST TECHNIQUE: Multidetector CT imaging of the abdomen and pelvis was performed following the standard protocol without IV contrast. COMPARISON:  08/09/2014. FINDINGS: Lower chest: The heart is mildly enlarged and coronary artery calcifications are noted. Mild atelectasis is noted at the lung bases. Hepatobiliary: The liver is slightly hyperdense in attenuation. No biliary ductal dilatation. The gallbladder is surgically absent. Pancreas: Unremarkable. No pancreatic ductal dilatation or surrounding inflammatory changes. Spleen: Normal in size without focal abnormality. Adrenals/Urinary Tract: No adrenal nodule or mass. Nonobstructive calculi are present in the lower pole of the right kidney. No obstructive uropathy is seen bilaterally. The visualized portion of the bladder is within normal limits. Examination is slightly limited due to hardware artifact. Stomach/Bowel: The stomach is within normal limits. No bowel obstruction, free air or pneumatosis. No focal bowel wall thickening. A moderate amount of retained stool is present in the colon. The appendix is not  visualized on exam and there are no inflammatory changes in the right lower quadrant. Vascular/Lymphatic: Aortic atherosclerosis. No enlarged abdominal or pelvic lymph  nodes. Reproductive: Status post hysterectomy. No adnexal masses. Other: No free fluid. A small fat containing umbilical hernia is noted. Musculoskeletal: Total hip arthroplasty changes are noted on the left. Mild degenerative changes are present in the lumbar spine. No acute osseous abnormality. IMPRESSION: 1. No bowel obstruction or free air. 2. Moderate amount of retained stool in the colon suggesting constipation. 3. Cardiomegaly with coronary artery calcifications. 4. Aortic atherosclerosis. 5. Nonobstructive right renal calculi. Electronically Signed   By: Brett Fairy M.D.   On: 08/18/2021 04:26   CT HEAD WO CONTRAST (5MM)  Result Date: 08/18/2021 CLINICAL DATA:  Dizziness, frequent falls. EXAM: CT HEAD WITHOUT CONTRAST TECHNIQUE: Contiguous axial images were obtained from the base of the skull through the vertex without intravenous contrast. COMPARISON:  12/12/2020. FINDINGS: Brain: No acute hemorrhage, midline shift or mass effect. No extra-axial fluid collection. Mild atrophy is noted. Mild periventricular white matter hypodensities are seen bilaterally. No hydrocephalus. Vascular: Atherosclerotic calcification of the vertebral arteries and carotid siphons. No hyperdense vessel. Skull: Normal. Negative for fracture or focal lesion. Sinuses/Orbits: No acute finding. Other: None. IMPRESSION: 1. No acute intracranial process. 2. Mild atrophy with chronic microvascular ischemic changes. Electronically Signed   By: Brett Fairy M.D.   On: 08/18/2021 04:29      Assessment/Plan Acute renal failure: Patient presented with creatinine elevated up to 2.02 with BUN 77 on 1/6.  Baseline creatinine previously was 0.77 on 12/18.  CT scan of the abdomen pelvis did not show any signs of bowel obstruction but did note moderate amount of retained  stool in the colon and nonobstructing right renal calculi.  The elevated BUN to creatinine ratio is greater than 20 to suggest prerenal cause.  Suspect recent diuretic changes as likely cause, but upper GI bleed could be on the differential.  Patient has been given 1 L of normal saline IV fluids and Zofran. -Admit to a medical telemetry bed -Strict intake and output -Hold nephrotoxic agents -Check CK given recent falls and monitor kidney function daily -Normal saline IV fluids at 100 mL/h x 1 day.  Reassess and determine need of continued IV fluid rehydration  Dizziness Fall: Patient reported dizziness with feeling off balance and note of a fall here in the ED.  CT scan of the head negative for any acute intercranial abnormality. -Check orthostatic vitals -Physical therapy to evaluate and treat -May warrant further work-up if symptoms persist after rehydrating patient  Chronic diarrhea: Patient reports that she has been dealing with diarrhea having 3 or more bowel movements a day with urgency.  CT scan noted concern for moderate constipation.  She is not on any stool softeners, but reported having bowel movement in the ED this morning since imaging performed.  Abdomen is soft nontender. -Patient has upcoming appointment with GI in regards to her issues with diarrhea in 2 weeks  Abnormal urinalysis: Acute.  Urinalysis noted small leukocytes, rare bacteria,  non squamous epithelial cells present, and 21-50 WBCs.  Patient denies any complaints of dysuria.  Suspect likely contaminant.  Essential hypertension: Home blood pressure regimen includes metoprolol 50 mg daily nightly, torsemide 20 mg daily, Diovan 320 mg nightly, spironolactone 25 mg daily -Held Diovan, torsemide, and spironolactone due to acute renal failure -Continue metoprolol as tolerated  Diastolic congestive heart failure: Chronic.  Last month EF noted to be 65 - 70% with grade 2 diastolic dysfunction by echocardiogram.  At this time  patient appears to be over diuresed. -Strict I&Os and daily weights -Holding diuretics  at this time -Will likely need to follow-up with Dr. Hilty/[facility] heart care at some point to decide best medication adjustment to make for the patient going forth  Paroxysmal atrial fibrillation on chronic anticoagulation: Patient seems to be in sinus rhythm at this time with heart rates in the 58 -Continue amiodarone and Eliquis  Diabetes mellitus type 2, uncontrolled: Hemoglobin A1c 8 in 07/2021. Home regimen includes Farxiga 10 mg nightly, Humalog 8 units twice daily, metformin 24 hr tab 500 mg nightly, Tresiba 20 units nightly, and Trulicity 1.5 mg q. Sunday. -Hypoglycemic protocol -Hold metformin -Held Farxiga due to kidney function -Reduced Tresiba 15 units nightly due to decreased kidney function -CBGs before every meal with moderate SSI -Adjust insulin regimen as needed  Transaminitis: Acute.  Labs 1/6 significant for alkaline phosphatase 184, AST 58, and ALT 65.  These labs previously were within normal limits at last hospitalization.   -Continue to monitor  Hyperlipidemia -Continue atorvastatin  Displaced proximal fifth metatarsal fracture: Sustained after fall noted last month. -Continue boot with ambulation   Nephrolithiasis: Patient noted to have a nonobstructing right renal stone on imaging. -Continue Flomax  History of GI bleed 2/2 gastric polyp  Gerd -Continue protonix  OSA: Patient reports that she usually takes off the CPAP usually during the night and would not like to be on it while here.    DVT prophylaxis: Eliquis Code Status: Full Family Communication: Family member updated at bedside Disposition Plan: Hopefully discharge home once medically stable Consults called: none Admission status: Inpatient require more than 2 midnight stay in the setting of acute renal failure  Norval Morton MD [facility]   If 7PM-7AM, please contact night-coverage   08/18/2021,  8:15 AM

## 2021-08-18 NOTE — Evaluation (Signed)
Physical Therapy Evaluation Patient Details Name: Alexis [name] MRN: [id number] DOB: [birth year] Today's Date: 08/18/2021  History of Present Illness  Pt is a 71 y.o. female admitted 08/17/21 with acute renal failure; pt also with falls, including one in ED. Head CT negative for acute injury. PMH includes DM, HTN, HLD, IBS, OSA, obesity, remote uterine cancer, chronic back pain. Of note, recent admission 07/24/21 with pulmonary edema.   Clinical Impression  Pt presents with an overall decrease in functional mobility secondary to above. PTA, pt ambulatory with RW, reports h/o falls "my knee gives out when I go up or down my stairs" requiring intermittent assist from family for this. Today, pt moving well with RW and supervision for safety; asymptomatic (see BP values below); HR 59-71. Pt would benefit from continued acute PT services to maximize functional mobility and independence prior to d/c with continued OP PT services.     Orthostatic BPs Supine 134/61  Sitting 117/60  Standing 123/52  Standing after 2 min 129/50  Post-ambulation 149/72      Recommendations for follow up therapy are one component of a multi-disciplinary discharge planning process, led by the attending physician.  Recommendations may be updated based on patient status, additional functional criteria and insurance authorization.  Follow Up Recommendations Outpatient PT    Assistance Recommended at Discharge Intermittent Supervision/Assistance  Patient can return home with the following  Assistance with cooking/housework;Help with stairs or ramp for entrance;Assist for transportation    Equipment Recommendations None recommended by PT  Recommendations for Other Services       Functional Status Assessment Patient has had a recent decline in their functional status and demonstrates the ability to make significant improvements in function in a reasonable and predictable amount of time.     Precautions / Restrictions  Precautions Precautions: Fall;Other (comment) Precaution Comments: H/o falls (reports knee gives out specifically on stairs), dizziness; negative orthostatics 08/18/20      Mobility  Bed Mobility Overal bed mobility: Modified Independent             General bed mobility comments: Mod indep with supine<>sit from ED stretcher    Transfers Overall transfer level: Needs assistance Equipment used: Rolling walker (2 wheels) Transfers: Sit to/from Stand Sit to Stand: Supervision           General transfer comment: able to stand from ED stretcher and low toilet height to RW with supervision for safety    Ambulation/Gait Ambulation/Gait assistance: Supervision Gait Distance (Feet): 40 Feet Assistive device: Rolling walker (2 wheels) Gait Pattern/deviations: Step-through pattern;Decreased stride length Gait velocity: Decreased     General Gait Details: Slow, steady gait with RW and supervision for safety, pt with preference to stay in room; pt denies lightheadedness  Stairs            Wheelchair Mobility    Modified Rankin (Stroke Patients Only)       Balance Overall balance assessment: Needs assistance Sitting-balance support: No upper extremity supported;Feet unsupported Sitting balance-Leahy Scale: Good     Standing balance support: No upper extremity supported;During functional activity Standing balance-Leahy Scale: Fair Standing balance comment: fair static standing to manage clothing after toileting                             Pertinent Vitals/Pain Pain Assessment: No/denies pain    Home Living Family/patient expects to be discharged to:: Private residence Living Arrangements: Spouse/significant other;Children Available Help at Discharge: Family;Available  24 hours/day Type of Home: House Home Access: Stairs to enter Entrance Stairs-Rails: Right;Left;Can reach both Entrance Stairs-Number of Steps: 4-6   Home Layout: One level Home  Equipment: Conservation officer, nature (2 wheels);Cane - single point;Shower seat;Grab bars - toilet;Grab bars - tub/shower;Hand held shower head Additional Comments: Lives with spouse, daughter and grandson (18 y.o.)    Prior Function Prior Level of Function : Needs assist;History of Falls (last six months)       Physical Assist : Mobility (physical) Mobility (physical): Gait;Stairs   Mobility Comments: Mod indep ambulating with RW, although h/o falls therefore family typically stays close, especially when on stairs; pt reports falls occur when knee gives out on stairs. Does not drive. Primarily sedentary. Had been working with OP PT since recent admission ADLs Comments: Pt reports mod indep with self care tasks     Hand Dominance        Extremity/Trunk Assessment   Upper Extremity Assessment Upper Extremity Assessment: Overall WFL for tasks assessed    Lower Extremity Assessment Lower Extremity Assessment: Generalized weakness    Cervical / Trunk Assessment Cervical / Trunk Assessment: Normal  Communication   Communication: No difficulties  Cognition Arousal/Alertness: Awake/alert Behavior During Therapy: WFL for tasks assessed/performed Overall Cognitive Status: No family/caregiver present to determine baseline cognitive functioning Area of Impairment: Attention;Memory;Awareness                   Current Attention Level: Selective Memory: Decreased short-term memory     Awareness: Emergent   General Comments: Pt verbose with speech, sometimes with unrelated topics, requiring redirection to current conversation; difficulty multitasking while talking; "I forgot what I was even talking about"        General Comments General comments (skin integrity, edema, etc.): Negative orthostatic hypotension this session, asymptomatic; HR 59-71. Husband present but left at beginning of session; pt endorses supportive family    Exercises     Assessment/Plan    PT Assessment  Patient needs continued PT services  PT Problem List Decreased strength;Decreased activity tolerance;Decreased balance;Decreased mobility;Decreased knowledge of use of DME;Decreased knowledge of precautions       PT Treatment Interventions DME instruction;Gait training;Functional mobility training;Therapeutic activities;Therapeutic exercise;Balance training;Stair training;Patient/family education    PT Goals (Current goals can be found in the Care Plan section)  Acute Rehab PT Goals Patient Stated Goal: figure out what medical issue is PT Goal Formulation: With patient Time For Goal Achievement: 09/01/21 Potential to Achieve Goals: Good    Frequency Min 3X/week     Co-evaluation               AM-PAC PT "6 Clicks" Mobility  Outcome Measure Help needed turning from your back to your side while in a flat bed without using bedrails?: None Help needed moving from lying on your back to sitting on the side of a flat bed without using bedrails?: None Help needed moving to and from a bed to a chair (including a wheelchair)?: A Little Help needed standing up from a chair using your arms (e.g., wheelchair or bedside chair)?: A Little Help needed to walk in hospital room?: A Little Help needed climbing 3-5 steps with a railing? : A Little 6 Click Score: 20    End of Session Equipment Utilized During Treatment: Gait belt Activity Tolerance: Patient tolerated treatment well Patient left: in bed;with call bell/phone within reach (ED stretcher) Nurse Communication: Mobility status PT Visit Diagnosis: Other abnormalities of gait and mobility (R26.89);Repeated falls (R29.6)  Time: 7893-8101 PT Time Calculation (min) (ACUTE ONLY): 18 min   Charges:   PT Evaluation $PT Eval Low Complexity: [place], PT, DPT Acute Rehabilitation Services  Pager [phone] Office [place] 08/18/2021, 5:21 PM

## 2021-08-18 NOTE — ED Provider Notes (Signed)
[facility] EMERGENCY DEPARTMENT Provider Note   CSN: [id number] Arrival date & time: 08/17/21  1403     History  No chief complaint on file.   Alexis [name] is a 71 y.o. female.  The history is provided by the patient and medical records.   71 y.o. F with hx of anemia, CHF with preserved EF, fatty liver, fibromyalgia, hyperlipidemia, presenting to the ED after abnormal labs at PCP office.  Patient reports she was hospitalized in December for CHF exacerbation with acute hypoxic respiratory failure.  While hospitalized she was found to have worsening anemia and underwent endoscopy and colonoscopy.  She did have some polyps removed and reported "clips" placed.  States since leaving the hospital last month she has had some ongoing nausea and vague abdominal discomfort.  She has had a lot of difficulty eating as she mostly does not feel hungry.  She has been trying to drink fluids regularly.  Also has had some episodic lightheadedness, mostly with position changes, standing up, or bending over.  This has caused her to fall a few times.  Home Medications Prior to Admission medications   Medication Sig Start Date End Date Taking? Authorizing Provider  acetaminophen (TYLENOL) 325 MG tablet Take 2 tablets (650 mg total) by mouth every 6 (six) hours as needed for mild pain or moderate pain. 12/12/20  Yes Nolberto Hanlon, MD  ALPRAZolam Duanne Moron) 0.5 MG tablet Take 0.5 mg by mouth at bedtime.   Yes [provider]  amiodarone (PACERONE) 200 MG tablet Take 1 tablet (200 mg total) by mouth daily. Patient taking differently: Take 200 mg by mouth 2 (two) times daily. 05/14/21  Yes Hilty, Nadean Corwin, MD  apixaban (ELIQUIS) 5 MG TABS tablet Take 1 tablet (5 mg total) by mouth 2 (two) times daily. 07/31/21  Yes Hosie Poisson, MD  atorvastatin (LIPITOR) 20 MG tablet Take 1 tablet (20 mg total) by mouth daily. 07/29/21  Yes Hosie Poisson, MD  b complex vitamins tablet Take 1 tablet by  mouth at bedtime.   Yes [provider]  FARXIGA 10 MG TABS tablet Take 10 mg by mouth at bedtime. 09/10/19  Yes [provider]  fish oil-omega-3 fatty acids 1000 MG capsule Take 1 g by mouth at bedtime.    Yes [provider]  insulin lispro (HUMALOG) 100 UNIT/ML KwikPen Inject 8 Units into the skin in the morning and at bedtime.   Yes [provider]  metFORMIN (GLUCOPHAGE-XR) 500 MG 24 hr tablet Take 500 mg by mouth at bedtime. 08/24/19  Yes [provider]  metoprolol succinate (TOPROL-XL) 50 MG 24 hr tablet Take 1 tablet (50 mg total) by mouth daily. Take with or immediately following a meal. Patient taking differently: Take 50 mg by mouth at bedtime. Take with or immediately following a meal. 07/29/21  Yes Hosie Poisson, MD  pantoprazole (PROTONIX) 40 MG tablet Take 1 tablet (40 mg total) by mouth 2 (two) times daily. 07/29/21  Yes Hosie Poisson, MD  pregabalin (LYRICA) 50 MG capsule Take 50 mg by mouth 2 (two) times daily.   Yes [provider]  spironolactone (ALDACTONE) 25 MG tablet Take 1 tablet (25 mg total) by mouth daily. 07/29/21  Yes Hosie Poisson, MD  tamsulosin (FLOMAX) 0.4 MG CAPS capsule Take 0.4 mg by mouth at bedtime. 07/17/16  Yes [provider]  torsemide (DEMADEX) 20 MG tablet Take 1 tablet (20 mg total) by mouth daily. 07/29/21  Yes Hosie Poisson, MD  TRESIBA FLEXTOUCH 100 UNIT/ML FlexTouch Pen Inject 20 Units into the skin at bedtime. 09/29/19  Yes [provider]  TRULICITY 1.5 NG/2.9BM SOPN Inject 1.5 mg into the skin every Sunday. 05/07/21  Yes [provider]  valsartan (DIOVAN) 320 MG tablet Take 320 mg by mouth at bedtime.   Yes [provider]  venlafaxine XR (EFFEXOR-XR) 150 MG 24 hr capsule Take 150 mg by mouth 2 (two) times daily.   Yes [provider]  zaleplon (SONATA) 5 MG capsule Take 5 mg by mouth at bedtime.   Yes [provider]      Allergies     Aspirin, Codeine, Naproxen, and Sulfonamide derivatives    Review of Systems   Review of Systems  Gastrointestinal:  Positive for nausea.  Neurological:  Positive for light-headedness.  All other systems reviewed and are negative.  Physical Exam Updated Vital Signs BP 117/78    Pulse 61    Temp 98.4 F (36.9 C) (Oral)    Resp 16    SpO2 99%   Physical Exam Vitals and nursing note reviewed.  Constitutional:      Appearance: She is well-developed.  HENT:     Head: Normocephalic and atraumatic.  Eyes:     Conjunctiva/sclera: Conjunctivae normal.     Pupils: Pupils are equal, round, and reactive to light.  Cardiovascular:     Rate and Rhythm: Normal rate and regular rhythm.     Heart sounds: Normal heart sounds.  Pulmonary:     Effort: Pulmonary effort is normal. No respiratory distress.     Breath sounds: No rhonchi.  Abdominal:     General: Bowel sounds are normal.     Palpations: Abdomen is soft.  Musculoskeletal:        General: Normal range of motion.     Cervical back: Normal range of motion.  Skin:    General: Skin is warm and dry.  Neurological:     Mental Status: She is alert and oriented to person, place, and time.    ED Results / Procedures / Treatments   Labs (all labs ordered are listed, but only abnormal results are displayed) Labs Reviewed  COMPREHENSIVE METABOLIC PANEL - Abnormal; Notable for the following components:      Result Value   Chloride 96 (*)    Glucose, Bld 183 (*)    BUN 77 (*)    Creatinine, Ser 2.02 (*)    AST 58 (*)    ALT 65 (*)    Alkaline Phosphatase 184 (*)    GFR, Estimated 26 (*)    All other components within normal limits  LIPASE, BLOOD - Abnormal; Notable for the following components:   Lipase 84 (*)    All other components within normal limits  CBC WITH DIFFERENTIAL/PLATELET - Abnormal; Notable for the following components:   MCH 25.9 (*)    RDW 16.2 (*)    All other components within normal limits  URINALYSIS,  ROUTINE W REFLEX MICROSCOPIC - Abnormal; Notable for the following components:   Glucose, UA >=500 (*)    Leukocytes,Ua SMALL (*)    All other components within normal limits  URINALYSIS, MICROSCOPIC (REFLEX) - Abnormal; Notable for the following components:   Bacteria, UA RARE (*)    Non Squamous Epithelial PRESENT (*)    All other components within normal limits  RESP PANEL BY RT-PCR (FLU A&B, COVID) [id number]    EKG None  Radiology CT ABDOMEN PELVIS WO CONTRAST  Result Date:  08/18/2021 CLINICAL DATA:  Abdominal pain and nausea, recent colonoscopy with biopsy. EXAM: CT ABDOMEN AND PELVIS WITHOUT CONTRAST TECHNIQUE: Multidetector CT imaging of the abdomen and pelvis was performed following the standard protocol without IV contrast. COMPARISON:  08/09/2014. FINDINGS: Lower chest: The heart is mildly enlarged and coronary artery calcifications are noted. Mild atelectasis is noted at the lung bases. Hepatobiliary: The liver is slightly hyperdense in attenuation. No biliary ductal dilatation. The gallbladder is surgically absent. Pancreas: Unremarkable. No pancreatic ductal dilatation or surrounding inflammatory changes. Spleen: Normal in size without focal abnormality. Adrenals/Urinary Tract: No adrenal nodule or mass. Nonobstructive calculi are present in the lower pole of the right kidney. No obstructive uropathy is seen bilaterally. The visualized portion of the bladder is within normal limits. Examination is slightly limited due to hardware artifact. Stomach/Bowel: The stomach is within normal limits. No bowel obstruction, free air or pneumatosis. No focal bowel wall thickening. A moderate amount of retained stool is present in the colon. The appendix is not visualized on exam and there are no inflammatory changes in the right lower quadrant. Vascular/Lymphatic: Aortic atherosclerosis. No enlarged abdominal or pelvic lymph nodes. Reproductive: Status post hysterectomy. No adnexal masses. Other: No  free fluid. A small fat containing umbilical hernia is noted. Musculoskeletal: Total hip arthroplasty changes are noted on the left. Mild degenerative changes are present in the lumbar spine. No acute osseous abnormality. IMPRESSION: 1. No bowel obstruction or free air. 2. Moderate amount of retained stool in the colon suggesting constipation. 3. Cardiomegaly with coronary artery calcifications. 4. Aortic atherosclerosis. 5. Nonobstructive right renal calculi. Electronically Signed   By: Brett Fairy M.D.   On: 08/18/2021 04:26   CT HEAD WO CONTRAST (5MM)  Result Date: 08/18/2021 CLINICAL DATA:  Dizziness, frequent falls. EXAM: CT HEAD WITHOUT CONTRAST TECHNIQUE: Contiguous axial images were obtained from the base of the skull through the vertex without intravenous contrast. COMPARISON:  12/12/2020. FINDINGS: Brain: No acute hemorrhage, midline shift or mass effect. No extra-axial fluid collection. Mild atrophy is noted. Mild periventricular white matter hypodensities are seen bilaterally. No hydrocephalus. Vascular: Atherosclerotic calcification of the vertebral arteries and carotid siphons. No hyperdense vessel. Skull: Normal. Negative for fracture or focal lesion. Sinuses/Orbits: No acute finding. Other: None. IMPRESSION: 1. No acute intracranial process. 2. Mild atrophy with chronic microvascular ischemic changes. Electronically Signed   By: Brett Fairy M.D.   On: 08/18/2021 04:29    Procedures Procedures    Medications Ordered in ED Medications  sodium chloride 0.9 % bolus 1,000 mL (0 mLs Intravenous Stopped 08/18/21 0448)  ondansetron (ZOFRAN) injection 4 mg (4 mg Intravenous Given 08/18/21 0319)    ED Course/ Medical Decision Making/ A&P                           Medical Decision Making  71 y.o. F here from PCP office with abnormal labs.  Hospitalized last month for CHF exacerbation w/hypoxia, underwent endoscopy/colonoscopy for same to evaluate for bleeding.  Had polypectomy with "clips"  placed per patient.  States since d/c from hospital 07/29/21 she has had trouble eating as she does not feel hungry with some ongoing nausea.  Also reports some lightheadedness, mostly with position change or standing.  This has caused her to fall a few times.  She is on Eliquis.  She is awake, alert, properly oriented on my exam.  Her abdomen is soft and nontender.  Labs obtained from triage which I have reviewed--  does have evidence of AKI with SrCr of 2.02, BUN 77.  Also has elevated LFT's and alk phos-- 58, 65, 184.  She is s/p cholecystectomy already.  Denies blood in stools.  Given recent procedures, will check CT abdomen pelvis without contrast along with CT of the head.    CT is obtained and reviewed, no acute findings.  She has been given IV fluids for hydration.  Suspect AKI secondary to poor oral intake, also possibility of being over-diuresed during last hospitalization.  Will admit for ongoing care.    Discussed with Dr. Hal Hope-- will admit for ongoing care.  Final Clinical Impression(s) / ED Diagnoses Final diagnoses:  AKI (acute kidney injury) [facility])  Dehydration    Rx / DC Orders ED Discharge Orders     None         Larene Pickett, PA-C 08/18/21 0556    Orpah Greek, MD 08/18/21 [phone]

## 2021-08-18 NOTE — ED Notes (Signed)
Pt ambulated to BR using walker. Pt tolerated well. Pt verbalizes understanding to call for assistance when she is ready to get back to bed.

## 2021-08-18 NOTE — Progress Notes (Signed)
Orthostatic BPs Supine 134/61  Sitting 117/60  Standing 123/52  Standing after 2 min 129/50  Post-ambulation 149/72   Patient asymptomatic with mobility during Physical Therapy session; HR 59-71  Mabeline Caras, PT, DPT Acute Rehabilitation Services  Pager [phone] Office [phone]

## 2021-08-19 ENCOUNTER — Other Ambulatory Visit: Payer: Self-pay

## 2021-08-19 ENCOUNTER — Encounter (HOSPITAL_COMMUNITY): Payer: Self-pay | Admitting: Internal Medicine

## 2021-08-19 DIAGNOSIS — K589 Irritable bowel syndrome without diarrhea: Secondary | ICD-10-CM

## 2021-08-19 DIAGNOSIS — K5900 Constipation, unspecified: Secondary | ICD-10-CM

## 2021-08-19 LAB — GLUCOSE, CAPILLARY
Glucose-Capillary: 167 mg/dL — ABNORMAL HIGH (ref 70–99)
Glucose-Capillary: 252 mg/dL — ABNORMAL HIGH (ref 70–99)
Glucose-Capillary: 145 mg/dL — ABNORMAL HIGH (ref 70–99)
Glucose-Capillary: 180 mg/dL — ABNORMAL HIGH (ref 70–99)

## 2021-08-19 LAB — BASIC METABOLIC PANEL
Anion gap: 7 (ref 5–15)
BUN: 51 mg/dL — ABNORMAL HIGH (ref 8–23)
CO2: 25 mmol/L (ref 22–32)
Calcium: 8.5 mg/dL — ABNORMAL LOW (ref 8.9–10.3)
Chloride: 109 mmol/L (ref 98–111)
Creatinine, Ser: 1.38 mg/dL — ABNORMAL HIGH (ref 0.44–1.00)
GFR, Estimated: 41 mL/min — ABNORMAL LOW (ref >60)
Glucose, Bld: 180 mg/dL — ABNORMAL HIGH (ref 70–99)
Potassium: 4.2 mmol/L (ref 3.5–5.1)
Sodium: 141 mmol/L (ref 135–145)

## 2021-08-19 MED ORDER — POLYETHYLENE GLYCOL 3350 17 G PO PACK
17.0000 g | PACK | Freq: Every day | ORAL | Status: DC
  Administered 2021-08-19 – 2021-08-20 (×2): 17 g via ORAL
  Filled 2021-08-19 (×2): qty 1

## 2021-08-19 MED ORDER — SODIUM CHLORIDE 0.9 % IV SOLN: INTRAVENOUS | Status: DC

## 2021-08-19 MED ORDER — LACTULOSE 10 GM/15ML PO SOLN
30.0000 g | Freq: Three times a day (TID) | ORAL | Status: DC | PRN
  Administered 2021-08-19: 30 g via ORAL
  Filled 2021-08-19: qty 45

## 2021-08-19 MED ORDER — SORBITOL 70 % SOLN
30.0000 mL | Freq: Two times a day (BID) | Status: DC | PRN
  Filled 2021-08-19: qty 30

## 2021-08-19 MED ORDER — SENNOSIDES-DOCUSATE SODIUM 8.6-50 MG PO TABS
2.0000 | ORAL_TABLET | Freq: Two times a day (BID) | ORAL | Status: DC
  Administered 2021-08-19 – 2021-08-20 (×3): 2 via ORAL
  Filled 2021-08-19 (×3): qty 2

## 2021-08-19 NOTE — Progress Notes (Signed)
Progress Note    Alexis [name]   TMH:[id number]  DOB: 03/03/1951  DOA: 08/17/2021     1 PCP: Deland Pretty, MD  Initial CC: dizziness, abnormal labs  Hospital Course: Alexis [name] is a 71 year old female with PMH chronic back pain, depression/anxiety, DM II, fibromyalgia, gastroparesis, HLD, HTN, IBS, OSA, vasculitis who presented to the hospital after having had abnormal labs on outpatient work-up.  Just recently hospitalized from 12/13-12/18/2022 presenting with shortness of breath diagnosed with a diastolic congestive heart failure exacerbation.  EF noted to be 65 - 70% with grade 2 diastolic dysfunction by echocardiogram.  She was diuresed with IV Lasix.  Cardiology had been consulted and ultimately patient had been switched from furosemide 20 mg to torsemide 20 mg daily with spironolactone 25 mg daily.  During hospitalization patient was also noted to have acute blood loss anemia requiring 1 unit of packed red blood cells.  Evaluated by by Dr. Havery [name] of GI and underwent EGD noting a large inflamed gastric polyp for which biopsies were taken.  Colonoscopy revealed multiple polyps which were removed and internal hemorrhoids patient was recommended to hold Eliquis for 3 days prior to restarting it.  Since being discharged from the hospital patient reports that she has been taking all the medications as prescribed.  Does note that she has been dizzy and feeling like she is losing her balance especially when getting up and moving around.  Other than that she reports having some nausea for which makes her feel like she is going to vomit but has not had any episodes of vomiting.  She admits that since being here at the hospital she fell while her husband was trying to get her to the restroom.  She did not lose consciousness or hurt anything to her knowledge.  Patient has been dealing with diarrhea and notes stools had been intermittently dark in color, but she denies seeing any blood to her  knowledge.  She also states that she had been urinating frequently due to the diuretics, but over the last couple days her urine output had decreased some.  Denies having any fever, palpitations, chest pain, leg swelling, or shortness of breath.  Upon admission into the emergency department patient was noted to be afebrile, pulse 55-62, blood pressure 102/53 ~136/62, and all other vital signs maintained.  Labs from 1/6 significant for BUN 77, creatinine 2.02(creatinine previously 0.77 on 12/18), glucose 183, alkaline phosphatase 184, lipase 84, AST 58, and ALT 65.  Urinalysis significant for glucose, small leukocytes, rare bacteria, and 21-50 WBCs.  CT of the head showed no acute abnormalities with mild atrophy and chronic microvascular ischemic changes. CT scan of the abdomen and pelvis noted moderate amount of retained stool in the colon suggesting constipation, cardiomegaly with coronary artery calcifications, aortic atherosclerosis, and nonobstructing right renal calculi.   She was admitted for IV fluids, trending of her renal function, and treatment of constipation.  Interval History:  Seen this morning in her room with husband present bedside.  I reviewed findings of her CT scan notably concern for underlying constipation and that her description of diarrhea could be overflow stool.  She was agreeable to trial of laxative regimen today. Renal function has improved and we will continue her working with PT as she was mildly orthostatic yesterday.  Assessment & Plan:  AKI -Creatinine elevated at 2.02 with BUN 77 on admission -Renal function prior to this was normal - Creatinine improving with fluids - Continue fluids today and repeat BMP in  a.m. - Etiology presumed to be possible overdiuresis.  Also possible that stool impaction could contribute to some outlet obstruction   Dizziness/Fall -Technically orthostatic on admission during BP check - Repeat orthostatic blood pressure today -  Continue fluids - Continue working with PT  Constipation - Patient complains of diarrhea symptoms however CT abdomen/pelvis shows significant stool burden and my concern would be for overflow stool at this time.  Patient is amenable for trial of laxatives today - With her history of IBS, she may be prone for constipation and would benefit from ongoing routine stool softener use at home and increased fiber -She has recently undergone EGD and colonoscopy on 07/27/2021   Abnormal urinalysis: Acute.  Urinalysis noted small leukocytes, rare bacteria,  non squamous epithelial cells present, and 21-50 WBCs.  Patient denies any complaints of dysuria.  Suspect likely contaminant.   Essential hypertension: Home blood pressure regimen includes metoprolol 50 mg daily nightly, torsemide 20 mg daily, Diovan 320 mg nightly, spironolactone 25 mg daily -Held Diovan, torsemide, and spironolactone due to acute renal failure -Continue metoprolol as tolerated   Diastolic congestive heart failure: Chronic.  Last month EF noted to be 65 - 70% with grade 2 diastolic dysfunction by echocardiogram.  At this time patient appears to be over diuresed. -Strict I&Os and daily weights -Holding diuretics at this time -Will likely need to follow-up with Dr. Hilty/[facility] at some point to decide best medication adjustment to make for the patient going forth   PAF -Continue amiodarone and Eliquis   Diabetes mellitus type 2, uncontrolled: Hemoglobin A1c 8 in 07/2021. Home regimen includes Farxiga 10 mg nightly, Humalog 8 units twice daily, metformin 24 hr tab 500 mg nightly, Tresiba 20 units nightly, and Trulicity 1.5 mg q. Sunday. -Hypoglycemic protocol -Hold metformin -Held Farxiga due to kidney function -Reduced Tresiba 15 units nightly due to decreased kidney function -CBGs before every meal with moderate SSI -Adjust insulin regimen as needed   Transaminitis: Acute.  Labs 1/6 significant for alkaline  phosphatase 184, AST 58, and ALT 65.  These labs previously were within normal limits at last hospitalization.   -Continue to monitor   Hyperlipidemia -Continue atorvastatin   Displaced proximal fifth metatarsal fracture: Sustained after fall noted last month. -Continue boot with ambulation    Nephrolithiasis: Patient noted to have a nonobstructing right renal stone on imaging. -Continue Flomax   History of GI bleed 2/2 gastric polyp  Gerd -Continue protonix   OSA: Patient reports that she usually takes off the CPAP usually during the night and would not like to be on it while here.    Old records reviewed in assessment of this patient  Antimicrobials:   DVT prophylaxis: Eliquis  Code Status:   Code Status: Full Code  Disposition Plan:  Home Status is: Inpt  Objective: Blood pressure (!) 142/52, pulse (!) 58, temperature 98 F (36.7 C), temperature source Oral, resp. rate 16, height 5\' 5"  (1.651 m), weight 86.7 kg, SpO2 100 %.  Examination:  Physical Exam Constitutional:      Appearance: Normal appearance.  HENT:     Head: Normocephalic and atraumatic.     Mouth/Throat:     Mouth: Mucous membranes are moist.  Eyes:     Extraocular Movements: Extraocular movements intact.  Cardiovascular:     Rate and Rhythm: Normal rate and regular rhythm.  Pulmonary:     Effort: Pulmonary effort is normal.  Abdominal:     General: Bowel sounds are normal. There  is no distension.     Palpations: Abdomen is soft.     Tenderness: There is no abdominal tenderness.  Musculoskeletal:        General: Normal range of motion.     Cervical back: Normal range of motion and neck supple.  Skin:    General: Skin is warm and dry.  Neurological:     General: No focal deficit present.     Mental Status: She is alert.  Psychiatric:        Mood and Affect: Mood normal.        Behavior: Behavior normal.     Consultants:    Procedures:    Data Reviewed: I have personally reviewed labs  and imaging studies    LOS: 1 day  Time spent: Greater than 50% of the 35 minute visit was spent in counseling/coordination of care for the patient as laid out in the A&P.   Dwyane Dee, MD [facility] 08/19/2021, 12:29 PM

## 2021-08-19 NOTE — Hospital Course (Addendum)
Alexis [name] is a 71 year old female with PMH chronic back pain, depression/anxiety, DM II, fibromyalgia, gastroparesis, HLD, HTN, IBS, OSA, vasculitis who presented to the hospital after having had abnormal labs on outpatient work-up.  Just recently hospitalized from 12/13-12/18/2022 presenting with shortness of breath diagnosed with a diastolic congestive heart failure exacerbation.  EF noted to be 65 - 70% with grade 2 diastolic dysfunction by echocardiogram.  She was diuresed with IV Lasix.  Cardiology had been consulted and ultimately patient had been switched from furosemide 20 mg to torsemide 20 mg daily with spironolactone 25 mg daily.  During hospitalization patient was also noted to have acute blood loss anemia requiring 1 unit of packed red blood cells.  Evaluated by by Dr. Havery Moros of GI and underwent EGD noting a large inflamed gastric polyp for which biopsies were taken.  Colonoscopy revealed multiple polyps which were removed and internal hemorrhoids patient was recommended to hold Eliquis for 3 days prior to restarting it.  Since being discharged from the hospital patient reports that she has been taking all the medications as prescribed.  Does note that she has been dizzy and feeling like she is losing her balance especially when getting up and moving around.  Other than that she reports having some nausea for which makes her feel like she is going to vomit but has not had any episodes of vomiting.  She admits that since being here at the hospital she fell while her husband was trying to get her to the restroom.  She did not lose consciousness or hurt anything to her knowledge.  Patient has been dealing with diarrhea and notes stools had been intermittently dark in color, but she denies seeing any blood to her knowledge.  She also states that she had been urinating frequently due to the diuretics, but over the last couple days her urine output had decreased some.  Denies having any fever,  palpitations, chest pain, leg swelling, or shortness of breath.  Upon admission into the emergency department patient was noted to be afebrile, pulse 55-62, blood pressure 102/53 ~136/62, and all other vital signs maintained.  Labs from 1/6 significant for BUN 77, creatinine 2.02(creatinine previously 0.77 on 12/18), glucose 183, alkaline phosphatase 184, lipase 84, AST 58, and ALT 65.  Urinalysis significant for glucose, small leukocytes, rare bacteria, and 21-50 WBCs.  CT of the head showed no acute abnormalities with mild atrophy and chronic microvascular ischemic changes. CT scan of the abdomen and pelvis noted moderate amount of retained stool in the colon suggesting constipation, cardiomegaly with coronary artery calcifications, aortic atherosclerosis, and nonobstructing right renal calculi.   She was admitted for IV fluids, trending of her renal function, and treatment of constipation.  She was started on aggressive laxative regimen on admission and stools turned from "diarrhea" to soft and formed as she had improvement from the presumed and confirmed overflow diarrhea.  She was recommended to continue on a bowel regimen at discharge to maintain regular stools.  Renal function also normalized with fluids and holding torsemide and ARB.  Blood pressure also remained low/normal and she was also orthostatic on admission.  Orthostatic blood pressure was repeated prior to discharge and was normal.  Her dose of ARB is being reduced by half at discharge and torsemide also being reduced by half.  She will need repeat BMP at follow-up to ensure renal stability.

## 2021-08-20 LAB — BASIC METABOLIC PANEL
Anion gap: 7 (ref 5–15)
BUN: 24 mg/dL — ABNORMAL HIGH (ref 8–23)
CO2: 26 mmol/L (ref 22–32)
Calcium: 8.7 mg/dL — ABNORMAL LOW (ref 8.9–10.3)
Chloride: 108 mmol/L (ref 98–111)
Creatinine, Ser: 1 mg/dL (ref 0.44–1.00)
GFR, Estimated: >60 mL/min (ref >60)
Glucose, Bld: 111 mg/dL — ABNORMAL HIGH (ref 70–99)
Potassium: 4 mmol/L (ref 3.5–5.1)
Sodium: 141 mmol/L (ref 135–145)

## 2021-08-20 LAB — MAGNESIUM: Magnesium: 1.8 mg/dL (ref 1.7–2.4)

## 2021-08-20 LAB — GLUCOSE, CAPILLARY
Glucose-Capillary: 101 mg/dL — ABNORMAL HIGH (ref 70–99)
Glucose-Capillary: 203 mg/dL — ABNORMAL HIGH (ref 70–99)
Glucose-Capillary: 213 mg/dL — ABNORMAL HIGH (ref 70–99)

## 2021-08-20 MED ORDER — PSYLLIUM 95 % PO PACK: 1.0000 | PACK | Freq: Every day | ORAL | Status: DC | PRN

## 2021-08-20 MED ORDER — SPIRONOLACTONE 25 MG PO TABS: 12.5000 mg | ORAL_TABLET | Freq: Every day | ORAL | 2 refills | Status: DC

## 2021-08-20 MED ORDER — POLYETHYLENE GLYCOL 3350 17 G PO PACK: 17.0000 g | PACK | Freq: Every day | ORAL | 0 refills | Status: DC

## 2021-08-20 MED ORDER — VALSARTAN 160 MG PO TABS: 160.0000 mg | ORAL_TABLET | Freq: Every day | ORAL | 3 refills | Status: AC

## 2021-08-20 MED ORDER — SORBITOL 70 % SOLN: 30.0000 mL | Freq: Two times a day (BID) | Status: DC | PRN

## 2021-08-20 MED ORDER — SENNOSIDES-DOCUSATE SODIUM 8.6-50 MG PO TABS: 2.0000 | ORAL_TABLET | Freq: Two times a day (BID) | ORAL | Status: DC

## 2021-08-20 MED ORDER — TORSEMIDE 10 MG PO TABS: 10.0000 mg | ORAL_TABLET | Freq: Every day | ORAL | 3 refills | Status: DC

## 2021-08-20 NOTE — Discharge Summary (Signed)
Physician Discharge Summary   Alexis [name] EHM:[id number] DOB: 29-Mar-1951 DOA: 08/17/2021  PCP: Deland Pretty, MD  Admit date: 08/17/2021 Discharge date:  08/20/2021  Admitted From: Home Disposition:  Home Discharging physician: Dwyane Dee, MD  Recommendations for Outpatient Follow-up:  Repeat BMP at follow up Torsemide reduced to 10 mg daily at discharge Valsartan reduced to 160 mg daily at discharge Suspect she is more prone for constipation from underlying IBS   Discharge Condition: stable CODE STATUS: Full Diet recommendation:  Diet Orders (From admission, onward)     Start     Ordered   08/20/21 0000  Diet - low sodium heart healthy        08/20/21 1153   08/20/21 0000  Diet Carb Modified        08/20/21 1153   08/18/21 0859  Diet heart healthy/carb modified Room service appropriate? Yes; Fluid consistency: Thin  Diet effective now       Question Answer Comment  Diet-HS Snack? Nothing   Room service appropriate? Yes   Fluid consistency: Thin      08/18/21 0901            Hospital Course: [name] is a 71 year old female with PMH chronic back pain, depression/anxiety, DM II, fibromyalgia, gastroparesis, HLD, HTN, IBS, OSA, vasculitis who presented to the hospital after having had abnormal labs on outpatient work-up.  Just recently hospitalized from 12/13-12/18/2022 presenting with shortness of breath diagnosed with a diastolic congestive heart failure exacerbation.  EF noted to be 65 - 70% with grade 2 diastolic dysfunction by echocardiogram.  She was diuresed with IV Lasix.  Cardiology had been consulted and ultimately patient had been switched from furosemide 20 mg to torsemide 20 mg daily with spironolactone 25 mg daily.  During hospitalization patient was also noted to have acute blood loss anemia requiring 1 unit of packed red blood cells.  Evaluated by by Dr. Havery Moros of GI and underwent EGD noting a large inflamed gastric polyp for which biopsies were  taken.  Colonoscopy revealed multiple polyps which were removed and internal hemorrhoids patient was recommended to hold Eliquis for 3 days prior to restarting it.  Since being discharged from the hospital patient reports that she has been taking all the medications as prescribed.  Does note that she has been dizzy and feeling like she is losing her balance especially when getting up and moving around.  Other than that she reports having some nausea for which makes her feel like she is going to vomit but has not had any episodes of vomiting.  She admits that since being here at the hospital she fell while her husband was trying to get her to the restroom.  She did not lose consciousness or hurt anything to her knowledge.  Patient has been dealing with diarrhea and notes stools had been intermittently dark in color, but she denies seeing any blood to her knowledge.  She also states that she had been urinating frequently due to the diuretics, but over the last couple days her urine output had decreased some.  Denies having any fever, palpitations, chest pain, leg swelling, or shortness of breath.  Upon admission into the emergency department patient was noted to be afebrile, pulse 55-62, blood pressure 102/53 ~136/62, and all other vital signs maintained.  Labs from 1/6 significant for BUN 77, creatinine 2.02(creatinine previously 0.77 on 12/18), glucose 183, alkaline phosphatase 184, lipase 84, AST 58, and ALT 65.  Urinalysis significant for glucose, small leukocytes, rare bacteria,  and 21-50 WBCs.  CT of the head showed no acute abnormalities with mild atrophy and chronic microvascular ischemic changes. CT scan of the abdomen and pelvis noted moderate amount of retained stool in the colon suggesting constipation, cardiomegaly with coronary artery calcifications, aortic atherosclerosis, and nonobstructing right renal calculi.   She was admitted for IV fluids, trending of her renal function, and treatment of  constipation.  She was started on aggressive laxative regimen on admission and stools turned from "diarrhea" to soft and formed as she had improvement from the presumed and confirmed overflow diarrhea.  She was recommended to continue on a bowel regimen at discharge to maintain regular stools.  Renal function also normalized with fluids and holding torsemide and ARB.  Blood pressure also remained low/normal and she was also orthostatic on admission.  Orthostatic blood pressure was repeated prior to discharge and was normal.  Her dose of ARB is being reduced by half at discharge and torsemide also being reduced by half.  She will need repeat BMP at follow-up to ensure renal stability.  No notes have been filed under this hospital service. Service: Hospitalist   The patient's chronic medical conditions were treated accordingly per the patient's home medication regimen except as noted.  On day of discharge, patient was felt deemed stable for discharge. Patient/family member advised to call PCP or come back to ER if needed.   Principal Diagnosis: ARF (acute renal failure) ([place])  Discharge Diagnoses: Principal Problem:   ARF (acute renal failure) ([facility]) Active Problems:   DM2 (diabetes mellitus, type 2) ([place])   Essential hypertension   AF (paroxysmal atrial fibrillation) ([facility])   Chronic diastolic CHF (congestive heart failure) ([place])   Chronic diarrhea   Anticoagulated   Transaminitis   Nephrolithiasis   Discharge Instructions     Ambulatory referral to Physical Therapy   Complete by: As directed    Diet - low sodium heart healthy   Complete by: As directed    Diet Carb Modified   Complete by: As directed    Increase activity slowly   Complete by: As directed       Allergies as of 08/20/2021       Reactions   Aspirin Other (See Comments)   Reaction: Vasculitis per MD   Codeine Itching, Rash   Tolerated Norco 12/07/20 with no reports of rash/itching. Patient endorsed allergy >30  yr ago and hasn't had reaction since.   Naproxen Nausea Only   Reaction: Makes stomach upset/irritated.   Sulfonamide Derivatives Itching, Rash        Medication List     TAKE these medications    acetaminophen 325 MG tablet Commonly known as: TYLENOL Take 2 tablets (650 mg total) by mouth every 6 (six) hours as needed for mild pain or moderate pain.   ALPRAZolam 0.5 MG tablet Commonly known as: XANAX Take 0.5 mg by mouth at bedtime.   amiodarone 200 MG tablet Commonly known as: PACERONE Take 1 tablet (200 mg total) by mouth daily. What changed: when to take this   apixaban 5 MG Tabs tablet Commonly known as: ELIQUIS Take 1 tablet (5 mg total) by mouth 2 (two) times daily.   atorvastatin 20 MG tablet Commonly known as: LIPITOR Take 1 tablet (20 mg total) by mouth daily.   b complex vitamins tablet Take 1 tablet by mouth at bedtime.   Farxiga 10 MG Tabs tablet Generic drug: dapagliflozin propanediol Take 10 mg by mouth at bedtime.   fish oil-omega-3 fatty  acids 1000 MG capsule Take 1 g by mouth at bedtime.   insulin lispro 100 UNIT/ML KwikPen Commonly known as: HUMALOG Inject 8 Units into the skin in the morning and at bedtime.   metFORMIN 500 MG 24 hr tablet Commonly known as: GLUCOPHAGE-XR Take 500 mg by mouth at bedtime.   metoprolol succinate 50 MG 24 hr tablet Commonly known as: TOPROL-XL Take 1 tablet (50 mg total) by mouth daily. Take with or immediately following a meal. What changed: when to take this   pantoprazole 40 MG tablet Commonly known as: PROTONIX Take 1 tablet (40 mg total) by mouth 2 (two) times daily.   polyethylene glycol 17 g packet Commonly known as: MIRALAX / GLYCOLAX Take 17 g by mouth daily. Start taking on: August 21, 2021   pregabalin 50 MG capsule Commonly known as: LYRICA Take 50 mg by mouth 2 (two) times daily.   psyllium 95 % Pack Commonly known as: HYDROCIL/METAMUCIL Take 1 packet by mouth daily as needed for  mild constipation.   senna-docusate 8.6-50 MG tablet Commonly known as: Senokot-S Take 2 tablets by mouth 2 (two) times daily.   sorbitol 70 % Soln Take 30 mLs by mouth 2 (two) times daily as needed for moderate constipation.   spironolactone 25 MG tablet Commonly known as: ALDACTONE Take 0.5 tablets (12.5 mg total) by mouth daily. What changed: how much to take   tamsulosin 0.4 MG Caps capsule Commonly known as: FLOMAX Take 0.4 mg by mouth at bedtime.   torsemide 10 MG tablet Commonly known as: DEMADEX Take 1 tablet (10 mg total) by mouth daily. What changed:  medication strength how much to take   Tresiba FlexTouch 100 UNIT/ML FlexTouch Pen Generic drug: insulin degludec Inject 20 Units into the skin at bedtime.   Trulicity 1.5 AY/3.0ZS Sopn Generic drug: Dulaglutide Inject 1.5 mg into the skin every Sunday.   valsartan 160 MG tablet Commonly known as: DIOVAN Take 1 tablet (160 mg total) by mouth daily. What changed:  medication strength how much to take when to take this   venlafaxine XR 150 MG 24 hr capsule Commonly known as: EFFEXOR-XR Take 150 mg by mouth 2 (two) times daily.   zaleplon 5 MG capsule Commonly known as: SONATA Take 5 mg by mouth at bedtime.        Follow-up Information     Deland Pretty, MD. Schedule an appointment as soon as possible for a visit in 1 week(s).   Specialty: Internal Medicine Contact information: [address] [place] [address] [phone]         Pixie Casino, MD .   Specialty: Cardiology Contact information: [place] [address] [phone]         Therapy, [facility].   Specialty: Physical Therapy Contact information: [address] [place] [address] [phone]                Allergies  Allergen Reactions   Aspirin Other (See Comments)    Reaction: Vasculitis per MD   Codeine Itching  and Rash    Tolerated Norco 12/07/20 with no reports of rash/itching. Patient endorsed allergy >30 yr ago and hasn't had reaction since.   Naproxen Nausea Only    Reaction: Makes stomach upset/irritated.   Sulfonamide Derivatives Itching and Rash    Consultations:   Discharge Exam: BP (!) 126/53    Pulse (!) 55    Temp 98.2  F (36.8 C) (Oral)    Resp 18    Ht 5\' 5"  (1.651 m)    Wt 82.9 kg    SpO2 95%    BMI 30.41 kg/m  Physical Exam Constitutional:      Appearance: Normal appearance.  HENT:     Head: Normocephalic and atraumatic.     Mouth/Throat:     Mouth: Mucous membranes are moist.  Eyes:     Extraocular Movements: Extraocular movements intact.  Cardiovascular:     Rate and Rhythm: Normal rate and regular rhythm.  Pulmonary:     Effort: Pulmonary effort is normal.  Abdominal:     General: Bowel sounds are normal. There is no distension.     Palpations: Abdomen is soft.     Tenderness: There is no abdominal tenderness.  Musculoskeletal:        General: Normal range of motion.     Cervical back: Normal range of motion and neck supple.  Skin:    General: Skin is warm and dry.  Neurological:     General: No focal deficit present.     Mental Status: She is alert.  Psychiatric:        Mood and Affect: Mood normal.        Behavior: Behavior normal.     The results of significant diagnostics from this hospitalization (including imaging, microbiology, ancillary and laboratory) are listed below for reference.   Microbiology: Recent Results (from the past 240 hour(s))  Resp Panel by RT-PCR (Flu A&B, Covid) Nasopharyngeal Swab     Status: None   Collection Time: 08/18/21  2:47 AM   Specimen: Nasopharyngeal Swab; Nasopharyngeal(NP) swabs in vial transport medium  Result Value Ref Range Status   SARS Coronavirus 2 by RT PCR NEGATIVE NEGATIVE Final    Comment: (NOTE) SARS-CoV-2 target nucleic acids are NOT DETECTED.  The SARS-CoV-2 RNA is generally detectable in upper  respiratory specimens during the acute phase of infection. The lowest concentration of SARS-CoV-2 viral copies this assay can detect is 138 copies/mL. A negative result does not preclude SARS-Cov-2 infection and should not be used as the sole basis for treatment or other patient management decisions. A negative result may occur with  improper specimen collection/handling, submission of specimen other than nasopharyngeal swab, presence of viral mutation(s) within the areas targeted by this assay, and inadequate number of viral copies(<138 copies/mL). A negative result must be combined with clinical observations, patient history, and epidemiological information. The expected result is Negative.  Fact Sheet for Patients:  EntrepreneurPulse.com.au  Fact Sheet for Healthcare Providers:  IncredibleEmployment.be  This test is no t yet approved or cleared by the [place] FDA and  has been authorized for detection and/or diagnosis of SARS-CoV-2 by FDA under an Emergency Use Authorization (EUA). This EUA will remain  in effect (meaning this test can be used) for the duration of the COVID-19 declaration under Section 564(b)(1) of the Act, 21 U.S.C.section 360bbb-3(b)(1), unless the authorization is terminated  or revoked sooner.       Influenza A by PCR NEGATIVE NEGATIVE Final   Influenza B by PCR NEGATIVE NEGATIVE Final    Comment: (NOTE) The Xpert Xpress SARS-CoV-2/FLU/RSV plus assay is intended as an aid in the diagnosis of influenza from Nasopharyngeal swab specimens and should not be used as a sole basis for treatment. Nasal washings and aspirates are unacceptable for Xpert Xpress SARS-CoV-2/FLU/RSV testing.  Fact Sheet for Patients: EntrepreneurPulse.com.au  Fact Sheet for Healthcare Providers: IncredibleEmployment.be  This test  is not yet approved or cleared by the [place] and has been  authorized for detection and/or diagnosis of SARS-CoV-2 by FDA under an Emergency Use Authorization (EUA). This EUA will remain in effect (meaning this test can be used) for the duration of the COVID-19 declaration under Section 564(b)(1) of the Act, 21 U.S.C. section 360bbb-3(b)(1), unless the authorization is terminated or revoked.  Performed at [facility], [place] [address], [place], [place] [address]      Labs: BNP (last 3 results) Recent Labs    04/26/21 0949 07/24/21 1355 07/29/21 0501  BNP 663.8* 868.1* 440.3*   Basic Metabolic Panel: Recent Labs  Lab 08/17/21 1658 08/18/21 1050 08/19/21 0237 08/20/21 0541  NA 136 136 141 141  K 4.7 4.6 4.2 4.0  CL 96* 99 109 108  CO2 28 26 25 26   GLUCOSE 183* 259* 180* 111*  BUN 77* 83* 51* 24*  CREATININE 2.02* 2.05* 1.38* 1.00  CALCIUM 9.4 8.9 8.5* 8.7*  MG  --   --   --  1.8   Liver Function Tests: Recent Labs  Lab 08/17/21 1658 08/18/21 1050  AST 58* 47*  ALT 65* 57*  ALKPHOS 184* 164*  BILITOT 0.4 0.3  PROT 7.6 6.8  ALBUMIN 3.5 3.2*   Recent Labs  Lab 08/17/21 1658  LIPASE 84*   No results for input(s): AMMONIA in the last 168 hours. CBC: Recent Labs  Lab 08/17/21 1658 08/18/21 1050  WBC 9.4 8.8  NEUTROABS 5.9  --   HGB 12.6 11.9*  HCT 40.2 38.2  MCV 82.5 83.8  PLT 193 169   Cardiac Enzymes: Recent Labs  Lab 08/18/21 1050  CKTOTAL 67   BNP: Invalid input(s): POCBNP CBG: Recent Labs  Lab 08/19/21 1638 08/19/21 2153 08/20/21 0649 08/20/21 0911 08/20/21 1157  GLUCAP 145* 180* 101* 203* 213*   D-Dimer No results for input(s): DDIMER in the last 72 hours. Hgb A1c Recent Labs    08/18/21 1050  HGBA1C 8.8*   Lipid Profile No results for input(s): CHOL, HDL, LDLCALC, TRIG, CHOLHDL, LDLDIRECT in the last 72 hours. Thyroid function studies No results for input(s): TSH, T4TOTAL, T3FREE, THYROIDAB in the last 72 hours.  Invalid input(s): FREET3 Anemia work up No results for  input(s): VITAMINB12, FOLATE, FERRITIN, TIBC, IRON, RETICCTPCT in the last 72 hours. Urinalysis    Component Value Date/Time   COLORURINE YELLOW 08/17/2021 1651   APPEARANCEUR CLEAR 08/17/2021 1651   LABSPEC 1.020 08/17/2021 1651   PHURINE 5.5 08/17/2021 1651   GLUCOSEU >=500 (A) 08/17/2021 1651   HGBUR NEGATIVE 08/17/2021 1651   BILIRUBINUR NEGATIVE 08/17/2021 1651   KETONESUR NEGATIVE 08/17/2021 1651   PROTEINUR NEGATIVE 08/17/2021 1651   UROBILINOGEN 0.2 02/09/2014 1603   NITRITE NEGATIVE 08/17/2021 1651   LEUKOCYTESUR SMALL (A) 08/17/2021 1651   Sepsis Labs Invalid input(s): PROCALCITONIN,  WBC,  LACTICIDVEN Microbiology Recent Results (from the past 240 hour(s))  Resp Panel by RT-PCR (Flu A&B, Covid) Nasopharyngeal Swab     Status: None   Collection Time: 08/18/21  2:47 AM   Specimen: Nasopharyngeal Swab; Nasopharyngeal(NP) swabs in vial transport medium  Result Value Ref Range Status   SARS Coronavirus 2 by RT PCR NEGATIVE NEGATIVE Final    Comment: (NOTE) SARS-CoV-2 target nucleic acids are NOT DETECTED.  The SARS-CoV-2 RNA is generally detectable in upper respiratory specimens during the acute phase of infection. The lowest concentration of SARS-CoV-2 viral copies this assay can detect is 138 copies/mL. A negative result  does not preclude SARS-Cov-2 infection and should not be used as the sole basis for treatment or other patient management decisions. A negative result may occur with  improper specimen collection/handling, submission of specimen other than nasopharyngeal swab, presence of viral mutation(s) within the areas targeted by this assay, and inadequate number of viral copies(<138 copies/mL). A negative result must be combined with clinical observations, patient history, and epidemiological information. The expected result is Negative.  Fact Sheet for Patients:  EntrepreneurPulse.com.au  Fact Sheet for Healthcare Providers:   IncredibleEmployment.be  This test is no t yet approved or cleared by the [place] FDA and  has been authorized for detection and/or diagnosis of SARS-CoV-2 by FDA under an Emergency Use Authorization (EUA). This EUA will remain  in effect (meaning this test can be used) for the duration of the COVID-19 declaration under Section 564(b)(1) of the Act, 21 U.S.C.section 360bbb-3(b)(1), unless the authorization is terminated  or revoked sooner.       Influenza A by PCR NEGATIVE NEGATIVE Final   Influenza B by PCR NEGATIVE NEGATIVE Final    Comment: (NOTE) The Xpert Xpress SARS-CoV-2/FLU/RSV plus assay is intended as an aid in the diagnosis of influenza from Nasopharyngeal swab specimens and should not be used as a sole basis for treatment. Nasal washings and aspirates are unacceptable for Xpert Xpress SARS-CoV-2/FLU/RSV testing.  Fact Sheet for Patients: EntrepreneurPulse.com.au  Fact Sheet for Healthcare Providers: IncredibleEmployment.be  This test is not yet approved or cleared by the [place] FDA and has been authorized for detection and/or diagnosis of SARS-CoV-2 by FDA under an Emergency Use Authorization (EUA). This EUA will remain in effect (meaning this test can be used) for the duration of the COVID-19 declaration under Section 564(b)(1) of the Act, 21 U.S.C. section 360bbb-3(b)(1), unless the authorization is terminated or revoked.  Performed at [facility], [place] [address] [place], [place] [address]     Procedures/Studies: CT ABDOMEN PELVIS WO CONTRAST  Result Date: 08/18/2021 CLINICAL DATA:  Abdominal pain and nausea, recent colonoscopy with biopsy. EXAM: CT ABDOMEN AND PELVIS WITHOUT CONTRAST TECHNIQUE: Multidetector CT imaging of the abdomen and pelvis was performed following the standard protocol without IV contrast. COMPARISON:  08/09/2014. FINDINGS: Lower chest: The heart is mildly enlarged  and coronary artery calcifications are noted. Mild atelectasis is noted at the lung bases. Hepatobiliary: The liver is slightly hyperdense in attenuation. No biliary ductal dilatation. The gallbladder is surgically absent. Pancreas: Unremarkable. No pancreatic ductal dilatation or surrounding inflammatory changes. Spleen: Normal in size without focal abnormality. Adrenals/Urinary Tract: No adrenal nodule or mass. Nonobstructive calculi are present in the lower pole of the right kidney. No obstructive uropathy is seen bilaterally. The visualized portion of the bladder is within normal limits. Examination is slightly limited due to hardware artifact. Stomach/Bowel: The stomach is within normal limits. No bowel obstruction, free air or pneumatosis. No focal bowel wall thickening. A moderate amount of retained stool is present in the colon. The appendix is not visualized on exam and there are no inflammatory changes in the right lower quadrant. Vascular/Lymphatic: Aortic atherosclerosis. No enlarged abdominal or pelvic lymph nodes. Reproductive: Status post hysterectomy. No adnexal masses. Other: No free fluid. A small fat containing umbilical hernia is noted. Musculoskeletal: Total hip arthroplasty changes are noted on the left. Mild degenerative changes are present in the lumbar spine. No acute osseous abnormality. IMPRESSION: 1. No bowel obstruction or free air. 2. Moderate amount of retained stool in the colon suggesting constipation. 3.  Cardiomegaly with coronary artery calcifications. 4. Aortic atherosclerosis. 5. Nonobstructive right renal calculi. Electronically Signed   By: Brett Fairy M.D.   On: 08/18/2021 04:26   DG Chest 2 View  Result Date: 07/24/2021 CLINICAL DATA:  Shortness of breath and hypoxia EXAM: CHEST - 2 VIEW COMPARISON:  04/26/2021 FINDINGS: Cardiomegaly. Aortic atherosclerosis. Pulmonary venous hypertension. Bilateral pleural effusions with dependent pulmonary atelectasis. Findings  consistent with congestive heart failure. Upper lungs are otherwise clear. IMPRESSION: Congestive heart failure. Cardiomegaly, aortic atherosclerosis, pulmonary venous hypertension, small pleural effusions and dependent atelectasis. Electronically Signed   By: Nelson Chimes M.D.   On: 07/24/2021 14:39   CT HEAD WO CONTRAST (5MM)  Result Date: 08/18/2021 CLINICAL DATA:  Dizziness, frequent falls. EXAM: CT HEAD WITHOUT CONTRAST TECHNIQUE: Contiguous axial images were obtained from the base of the skull through the vertex without intravenous contrast. COMPARISON:  12/12/2020. FINDINGS: Brain: No acute hemorrhage, midline shift or mass effect. No extra-axial fluid collection. Mild atrophy is noted. Mild periventricular white matter hypodensities are seen bilaterally. No hydrocephalus. Vascular: Atherosclerotic calcification of the vertebral arteries and carotid siphons. No hyperdense vessel. Skull: Normal. Negative for fracture or focal lesion. Sinuses/Orbits: No acute finding. Other: None. IMPRESSION: 1. No acute intracranial process. 2. Mild atrophy with chronic microvascular ischemic changes. Electronically Signed   By: Brett Fairy M.D.   On: 08/18/2021 04:29   DG Foot Complete Right  Result Date: 07/26/2021 CLINICAL DATA:  Right foot pain and swelling. EXAM: RIGHT FOOT COMPLETE - 3+ VIEW COMPARISON:  December 04, 2020. FINDINGS: Mildly displaced fracture is seen involving the proximal base of the fifth metatarsal. No other bony abnormality is noted. No soft tissue abnormality is noted. Joint spaces are intact. IMPRESSION: Mildly displaced proximal fifth metatarsal fracture. Electronically Signed   By: Marijo Conception M.D.   On: 07/26/2021 15:43   ECHOCARDIOGRAM COMPLETE  Result Date: 07/25/2021    ECHOCARDIOGRAM REPORT   Patient Name:   Alexis [name] Date of Exam: 07/25/2021 Medical Rec #:  [id number]         Height:       65.0 in Accession #:    [id number]        Weight:       180.0 lb Date of Birth:   February 05, 1951         BSA:          1.892 m Patient Age:    71 years          BP:           111/91 mmHg Patient Gender: F                 HR:           63 bpm. Exam Location:  Inpatient Procedure: Cardiac Doppler, Color Doppler and Intracardiac Opacification Agent Indications:    congestive heart failure  History:        Patient has prior history of Echocardiogram examinations, most                 recent 05/02/2021. CHF, Previous Myocardial Infarction,                 Signs/Symptoms:Shortness of Breath and Syncope; Risk                 Factors:Diabetes, Hypertension and Dyslipidemia.  Sonographer:    Melissa Morford RDCS (AE, PE) Referring Phys: [place]  1. Left ventricular ejection fraction, by estimation, is  65 to 70%. The left ventricle has normal function. The left ventricle has no regional wall motion abnormalities. Left ventricular diastolic parameters are consistent with Grade II diastolic dysfunction (pseudonormalization).  2. Right ventricular systolic function is normal. The right ventricular size is normal.  3. The mitral valve is degenerative. No evidence of mitral valve regurgitation. No evidence of mitral stenosis. Moderate mitral annular calcification.  4. The aortic valve is tricuspid. Aortic valve regurgitation is not visualized. Aortic valve sclerosis is present, with no evidence of aortic valve stenosis.  5. The inferior vena cava is normal in size with <50% respiratory variability, suggesting right atrial pressure of 8 mmHg. Comparison(s): No significant change from prior study. FINDINGS  Left Ventricle: Left ventricular ejection fraction, by estimation, is 65 to 70%. The left ventricle has normal function. The left ventricle has no regional wall motion abnormalities. Definity contrast agent was given IV to delineate the left ventricular  endocardial borders. The left ventricular internal cavity size was normal in size. There is no left ventricular hypertrophy. Left  ventricular diastolic parameters are consistent with Grade II diastolic dysfunction (pseudonormalization). Right Ventricle: The right ventricular size is normal. No increase in right ventricular wall thickness. Right ventricular systolic function is normal. The tricuspid regurgitant velocity is 2.81 m/s, and with an assumed right atrial pressure of 8 mmHg, the estimated right ventricular systolic pressure is 63.0 mmHg. Left Atrium: Left atrial size was normal in size. Right Atrium: Right atrial size was normal in size. Pericardium: There is no evidence of pericardial effusion. Mitral Valve: The mitral valve is degenerative in appearance. Moderate mitral annular calcification. No evidence of mitral valve regurgitation. No evidence of mitral valve stenosis. Tricuspid Valve: The tricuspid valve is normal in structure. Tricuspid valve regurgitation is trivial. No evidence of tricuspid stenosis. Aortic Valve: The aortic valve is tricuspid. Aortic valve regurgitation is not visualized. Aortic valve sclerosis is present, with no evidence of aortic valve stenosis. Pulmonic Valve: The pulmonic valve was not well visualized. Pulmonic valve regurgitation is not visualized. No evidence of pulmonic stenosis. Aorta: The aortic root is normal in size and structure. Venous: The inferior vena cava is normal in size with less than 50% respiratory variability, suggesting right atrial pressure of 8 mmHg. IAS/Shunts: The atrial septum is grossly normal.  LEFT VENTRICLE PLAX 2D LVIDd:         4.60 cm   Diastology LVIDs:         2.80 cm   LV e' medial:    4.68 cm/s LV PW:         1.00 cm   LV E/e' medial:  24.4 LV IVS:        1.00 cm   LV e' lateral:   4.79 cm/s LVOT diam:     1.90 cm   LV E/e' lateral: 23.8 LV SV:         80 LV SV Index:   42 LVOT Area:     2.84 cm  RIGHT VENTRICLE RV S prime:     14.50 cm/s TAPSE (M-mode): 2.2 cm LEFT ATRIUM             Index        RIGHT ATRIUM           Index LA diam:        4.50 cm 2.38 cm/m   RA  Area:     16.90 cm LA Vol (A2C):   51.0 ml 26.96 ml/m  RA Volume:   48.70  ml  25.75 ml/m LA Vol (A4C):   45.4 ml 24.00 ml/m LA Biplane Vol: 48.1 ml 25.43 ml/m  AORTIC VALVE LVOT Vmax:   125.00 cm/s LVOT Vmean:  87.700 cm/s LVOT VTI:    0.283 m  AORTA Ao Root diam: 2.80 cm MITRAL VALVE                TRICUSPID VALVE MV Area (PHT): 5.54 cm     TR Peak grad:   31.6 mmHg MV Decel Time: 137 msec     TR Vmax:        281.00 cm/s MV E velocity: 114.00 cm/s MV A velocity: 107.00 cm/s  SHUNTS MV E/A ratio:  1.07         Systemic VTI:  0.28 m                             Systemic Diam: 1.90 cm Rudean Haskell MD Electronically signed by Rudean Haskell MD Signature Date/Time: 07/25/2021/3:06:53 PM    Final    VAS [place] LOWER EXTREMITY VENOUS (DVT)  Result Date: 07/26/2021  Lower Venous DVT Study Patient Name:  MAXX CALAWAY  Date of Exam:   07/26/2021 Medical Rec #: [id number]          Accession #:    [id number] Date of Birth: 07/03/1951          Patient Gender: F Patient Age:   25 years Exam Location:  [facility] Procedure:      VAS [place] LOWER EXTREMITY VENOUS (DVT) Referring Phys: Hosie Poisson --------------------------------------------------------------------------------  Indications: Swelling, recent fall.  Risk Factors: History of uterine cancer. Comparison Study: 07-27-2019 Lower extremity venous RT for swelling/tingling +1                   year negative for DVT. Performing Technologist: Darlin Coco RDMS, RVT  Examination Guidelines: A complete evaluation includes B-mode imaging, spectral Doppler, color Doppler, and power Doppler as needed of all accessible portions of each vessel. Bilateral testing is considered an integral part of a complete examination. Limited examinations for reoccurring indications may be performed as noted. The reflux portion of the exam is performed with the patient in reverse Trendelenburg.  +---------+---------------+---------+-----------+----------+--------------+   RIGHT     Compressibility Phasicity Spontaneity Properties Thrombus Aging  +---------+---------------+---------+-----------+----------+--------------+  CFV       Full            Yes       Yes                                    +---------+---------------+---------+-----------+----------+--------------+  SFJ       Full                                                             +---------+---------------+---------+-----------+----------+--------------+  FV Prox   Full                                                             +---------+---------------+---------+-----------+----------+--------------+  FV Mid    Full                                                             +---------+---------------+---------+-----------+----------+--------------+  FV Distal Full                                                             +---------+---------------+---------+-----------+----------+--------------+  PFV       Full                                                             +---------+---------------+---------+-----------+----------+--------------+  POP       Full            Yes       Yes                                    +---------+---------------+---------+-----------+----------+--------------+  PTV       Full                                                             +---------+---------------+---------+-----------+----------+--------------+  PERO      Full                                                             +---------+---------------+---------+-----------+----------+--------------+  Gastroc   Full                                                             +---------+---------------+---------+-----------+----------+--------------+   +---------+---------------+---------+-----------+----------+--------------+  LEFT      Compressibility Phasicity Spontaneity Properties Thrombus Aging  +---------+---------------+---------+-----------+----------+--------------+  CFV       Full            Yes       Yes                                     +---------+---------------+---------+-----------+----------+--------------+  SFJ       Full                                                             +---------+---------------+---------+-----------+----------+--------------+  FV Prox   Full                                                             +---------+---------------+---------+-----------+----------+--------------+  FV Mid    Full                                                             +---------+---------------+---------+-----------+----------+--------------+  FV Distal Full                                                             +---------+---------------+---------+-----------+----------+--------------+  PFV       Full                                                             +---------+---------------+---------+-----------+----------+--------------+  POP       Full            Yes       Yes                                    +---------+---------------+---------+-----------+----------+--------------+  PTV       Full                                                             +---------+---------------+---------+-----------+----------+--------------+  PERO      Full                                                             +---------+---------------+---------+-----------+----------+--------------+  Gastroc   Full                                                             +---------+---------------+---------+-----------+----------+--------------+     Summary: RIGHT: - There is no evidence of deep vein thrombosis in the lower extremity.  - No cystic structure found in the popliteal fossa.  - Ultrasound characteristics of enlarged lymph nodes are noted in the groin.  LEFT: - There is no evidence of deep vein thrombosis in the lower extremity.  - No cystic structure  found in the popliteal fossa.  - Ultrasound characteristics of enlarged, echogenic lymph nodes noted in the groin.  *See table(s) above for measurements  and observations. Electronically signed by Monica Martinez MD on 07/26/2021 at 7:56:44 PM.    Final      Time coordinating discharge: Over 64 minutes    Dwyane Dee, MD  [facility] 08/20/2021, 1:17 PM

## 2021-08-20 NOTE — TOC Transition Note (Signed)
Transition of Care [facility]) - CM/SW Discharge Note   Patient Details  Name: Alexis [name] MRN: [id number] Date of Birth: [birth year]  [facility] CM/SW Contact:  Tom-Johnson, Renea Ee, RN Phone Number: 08/20/2021, 12:47 PM   Clinical Narrative:     Patient is scheduled for discharge today. CM spoke with patient at bedside about outpatient PT recommendations. Patient states she lives with her husband and has two daughters whom are supportive. States she has all necessary DME's at home. Patient states she had recently used outpatient services at [facility] and would like to continue with their services. CM called and spoke with [facility] [place] [phone]) and she confirmed that patient had used their services and accepted referral. CM faxed order to [phone] with successful notice received. Husband at bedside and will transport at discharge. No further TOC needs noted.    Final next level of care: OP Rehab Barriers to Discharge: Barriers Resolved   Patient Goals and CMS Choice Patient states their goals for this hospitalization and ongoing recovery are:: To return home CMS Medicare.gov Compare Post Acute Care list provided to:: Patient Choice offered to / list presented to : Patient  Discharge Placement                       Discharge Plan and Services                DME Arranged: N/A DME Agency: NA       HH Arranged: NA HH Agency: NA        Social Determinants of Health (SDOH) Interventions     Readmission Risk Interventions Readmission Risk Prevention Plan 07/25/2021  Transportation Screening Complete  PCP or Specialist Appt within 3-5 Days Complete  HRI or [place] Complete  Social Work Consult for [place] Planning/Counseling Complete  Palliative Care Screening Not Applicable  Medication Review Press photographer) Complete  Some recent data might be hidden

## 2021-08-20 NOTE — Plan of Care (Signed)
°  Problem: Education: Goal: Knowledge of General Education information will improve Description: Including pain rating scale, medication(s)/side effects and non-pharmacologic comfort measures Outcome: Progressing   Problem: Clinical Measurements: Goal: Ability to maintain clinical measurements within normal limits will improve Outcome: Progressing Goal: Respiratory complications will improve Outcome: Progressing   Problem: Nutrition: Goal: Adequate nutrition will be maintained Outcome: Progressing   Problem: Coping: Goal: Level of anxiety will decrease Outcome: Progressing

## 2021-08-20 NOTE — Progress Notes (Signed)
DISCHARGE NOTE HOME EVANELL [facility]LICH to be discharged Home per MD order. Discussed prescriptions and follow up appointments with the patient. Prescriptions given to patient; medication list explained in detail. Patient verbalized understanding.  Skin clean, dry and intact without evidence of skin break down, no evidence of skin tears noted. IV catheter discontinued intact. Site without signs and symptoms of complications. Dressing and pressure applied. Pt denies pain at the site currently. No complaints noted.  Patient free of lines, drains, and wounds.   An After Visit Summary (AVS) was printed and given to the patient. Patient escorted via wheelchair, and discharged home via private auto.  Dolores Hoose, RN

## 2021-08-20 NOTE — Discharge Instructions (Signed)

## 2021-08-22 ENCOUNTER — Encounter: Payer: Medicare Other | Admitting: Gastroenterology

## 2021-09-05 ENCOUNTER — Ambulatory Visit (INDEPENDENT_AMBULATORY_CARE_PROVIDER_SITE_OTHER): Payer: Medicare Other | Admitting: Physician Assistant

## 2021-09-05 ENCOUNTER — Other Ambulatory Visit: Payer: Self-pay

## 2021-09-05 ENCOUNTER — Encounter: Payer: Self-pay | Admitting: Physician Assistant

## 2021-09-05 VITALS — BP 128/66 | HR 65 | Wt 177.6 lb

## 2021-09-05 DIAGNOSIS — I10 Essential (primary) hypertension: Secondary | ICD-10-CM

## 2021-09-05 DIAGNOSIS — I480 Paroxysmal atrial fibrillation: Secondary | ICD-10-CM

## 2021-09-05 DIAGNOSIS — E785 Hyperlipidemia, unspecified: Secondary | ICD-10-CM

## 2021-09-05 DIAGNOSIS — I2510 Atherosclerotic heart disease of native coronary artery without angina pectoris: Secondary | ICD-10-CM

## 2021-09-05 DIAGNOSIS — N179 Acute kidney failure, unspecified: Secondary | ICD-10-CM | POA: Diagnosis not present

## 2021-09-05 DIAGNOSIS — E119 Type 2 diabetes mellitus without complications: Secondary | ICD-10-CM

## 2021-09-05 NOTE — Progress Notes (Signed)
Cardiology Office Note:    Date:  09/07/2021   ID:  VANDY FONG, DOB 1951/04/09, MRN [id number]  PCP:  Deland Pretty, MD   [place] Prov[facility] Cardiologist:  Pixie Casino, MD     Referring MD: Deland Pretty, MD   Chief Complaint  Patient presents with   Follow-up    Seen for Dr. Debara Pickett    History of Present Illness:    Alexis [name] is a 71 y.o. female with a hx of HLD, HTN, DM II, fatty liver disease, IBS, PAF, CAD, OSA and GERD. There was a past history of mild out of the hospital MI, however this was likely due to panic attack.  There was no troponin evidence at the time due to her delayed presentation.  Myoview since then has not shown any scar.  In the past, she wore a heart monitor in 2004 due to episode of syncope, this demonstrated occasional unifocal PVCs, no sustained arrhythmia.  Myoview obtained in June 2014 showed EF 63%, no evidence of previous infarct.  In 2018, she wore a 48-hour Holter monitor that showed occasional PACs and PVCs as well as short run of SVT.  She had a coronary CTA in 08/2016 that showed mild two-vessel disease with plaque in the LAD and RCA, coronary calcium score of 90.  She also underwent repeat sleep study that demonstrated moderate obstructive sleep apnea.  She was started on Farxiga for diabetes in the setting of coronary artery disease. Carotid Doppler obtained in August 2019 showed mild disease bilaterally.  She has been seen by Dr. Jannifer Franklin of neurology for mild memory disturbance.  I recommended a repeat coronary CT, this was performed on 11/21/2020, this revealed diffuse nonobstructive CAD, coronary calcium score 469 which placed the patient in 93rd percentile for age and sex matched control, small PFO.   She was hospitalized in 2022 with left hip fracture, she developed atrial fibrillation postop.  Her Plavix was stopped and she was switched to Eliquis 5 mg twice a day. Patient was placed on amiodarone during this admission however was  still going in and out of atrial fibrillation.  She was also treated for acute diastolic CHF.  More recently, patient was admitted to the hospital in September 2022 for volume overload and acute diastolic heart failure.  Echocardiogram at the time showed normal EF.  She underwent IV diuresis with improvement.  She was not discharged on Lasix, patient was started on 20 mg daily of Lasix on the follow-up by Dr. Debara Pickett on 05/14/2021.  Amiodarone decreased to 200 mg daily.  She was readmitted to the hospital in December 2020 with acute on chronic diastolic heart failure.  She underwent IV diuresis again.  Hemoglobin dropped down to 7.1.  She was given 1 unit of packed red blood cell transfusion.  Repeat hemoglobin went up to 10.  Stool Hemoccult was positive.  EGD and colonoscopy performed showed 6 polyps which were removed, no inflammatory changes of her colon, EGD demonstrated large benign-appearing inflamed polyp in gastric cardia which was technically challenging to remove.  This was felt to be the culprit of her heme positive stool.  There was another inflamed gastric polyp in the distal stomach which was removed and a biopsy. Hemostasis clip applied to the largest polypectomy site to reduce risk for future bleeding.  Repeat echocardiogram showed EF 65 to 70%, no regional wall motion abnormality, grade 2 DD.  Lasix was further switched to torsemide 20 mg daily.  Eliquis was held  for few days due to rebleeding risk.  Unfortunately she returned back to the hospital on 08/18/2021 with severe weakness and AKI.  Torsemide was reduced to 10 mg daily upon discharge.  The valsartan reduced to 160 mg daily.  Renal function normalized with fluid and holding ARB and torsemide during hospitalization.  Orthostatic blood pressure was repeated prior to discharge and was normal.  Patient presents today for follow-up.  Her weight today is 177.6 pounds.  Her previous discharge dry weight was 182.6 pounds.  She has essentially lost 5  pounds since her discharge.  Given her recent AKI, I plan to repeat a basic metabolic panel.  Last blood work was obtained by PCP on 08/23/2021 that showed creatinine was 1.2 at the time which was slightly higher than her last creatinine prior to her discharge which was 1.0.  She denies any chest pain or worsening dyspnea.  She drinks about 64 ounces of water per day.  She does not measure her weight every morning.  On physical exam, she does not appears to be volume overload.  I recommended follow-up with Dr. Debara Pickett as previously scheduled in March.  If her creatinine is up again, I may change to torsemide to as needed instead.   Past Medical History:  Diagnosis Date   Anxiety    Arthritis    "knees" (05/25/2014)   Chronic back pain    Depression    Diabetes mellitus type II    Diabetic peripheral neuropathy ([place]) 10/20/2018   Diverticulitis    Fatty liver    Fibromyalgia    Gastric polyp    hyperplastic   Gastroparesis    "recently dx'd" (05/25/2014)   GERD (gastroesophageal reflux disease)    H/O hiatal hernia    Hx of gastritis    Hyperlipidemia    Hypertension    Irritable bowel syndrome (IBS)    Memory difficulties 09/18/2017   Migraine without aura, without mention of intractable migraine without mention of status migrainosus    "related to allergies; have them in the spring and fall" (05/25/2014)   Obesity    Sleep apnea    "wore mask; took it off in my sleep; quit wearing it" (05/25/2014)   Uterine cancer ([place]) dx'd 2000   surg only   Vasculitis ([place])    "irritates my legs"   Wears glasses     Past Surgical History:  Procedure Laterality Date   ANTERIOR CERVICAL DECOMPRESSION/DISCECTOMY FUSION 4 LEVELS N/A 05/07/2018   Procedure: ANTERIOR CERVICAL DECOMPRESSION FUSION, CERVICAL 4-5,CERVICAL 5-6, CERVICAL 6-7 WITH INSTRUMENTATION AND ALLOGRAFT;  Surgeon: Phylliss Bob, MD;  Location: [facility];  Service: Orthopedics;  Laterality: N/A;   APPENDECTOMY  ~ 1967   BIOPSY   07/27/2021   Procedure: BIOPSY;  Surgeon: Yetta Flock, MD;  Location: [facility];  Service: Gastroenterology;;   BREAST CYST EXCISION Right 1990   COLONOSCOPY     COLONOSCOPY WITH PROPOFOL N/A 07/27/2021   Procedure: COLONOSCOPY WITH PROPOFOL;  Surgeon: Yetta Flock, MD;  Location: [facility];  Service: Gastroenterology;  Laterality: N/A;   ESOPHAGOGASTRODUODENOSCOPY (EGD) WITH PROPOFOL N/A 07/27/2021   Procedure: ESOPHAGOGASTRODUODENOSCOPY (EGD) WITH PROPOFOL;  Surgeon: Yetta Flock, MD;  Location: [facility];  Service: Gastroenterology;  Laterality: N/A;   HEMOSTASIS CLIP PLACEMENT  07/27/2021   Procedure: HEMOSTASIS CLIP PLACEMENT;  Surgeon: Yetta Flock, MD;  Location: [facility];  Service: Gastroenterology;;   LAPAROSCOPIC CHOLECYSTECTOMY  1990   LEFT HEART CATHETERIZATION WITH CORONARY ANGIOGRAM N/A 05/26/2014   Procedure:  LEFT HEART CATHETERIZATION WITH CORONARY ANGIOGRAM;  Surgeon: Troy Sine, MD;  Location: [facility] CATH LAB;  Service: Cardiovascular;  Laterality: N/A;   POLYPECTOMY  07/27/2021   Procedure: POLYPECTOMY;  Surgeon: Yetta Flock, MD;  Location: [facility];  Service: Gastroenterology;;   Clide Deutscher  07/27/2021   Procedure: Clide Deutscher;  Surgeon: Yetta Flock, MD;  Location[facility] ENDOSCOPY;  Service: Gastroenterology;;   SINUS SURGERY WITH INSTATRAK  2000   TOTAL HIP ARTHROPLASTY Left 12/05/2020   Procedure: TOTAL HIP ARTHROPLASTY ANTERIOR APPROACH;  Surgeon: Rod Can, MD;  Location: [facility];  Service: Orthopedics;  Laterality: Left;   TUBAL LIGATION  ~ 1982   VAGINAL HYSTERECTOMY  2000    Current Medications: Current Meds  Medication Sig   acetaminophen (TYLENOL) 325 MG tablet Take 2 tablets (650 mg total) by mouth every 6 (six) hours as needed for mild pain or moderate pain.   ALPRAZolam (XANAX) 0.5 MG tablet Take 0.5 mg by mouth at bedtime.   amiodarone (PACERONE) 200 MG tablet Take 1 tablet (200 mg  total) by mouth daily.   apixaban (ELIQUIS) 5 MG TABS tablet Take 1 tablet (5 mg total) by mouth 2 (two) times daily.   atorvastatin (LIPITOR) 20 MG tablet Take 1 tablet (20 mg total) by mouth daily.   b complex vitamins tablet Take 1 tablet by mouth at bedtime.   FARXIGA 10 MG TABS tablet Take 10 mg by mouth at bedtime.   fish oil-omega-3 fatty acids 1000 MG capsule Take 1 g by mouth at bedtime.    insulin lispro (HUMALOG) 100 UNIT/ML KwikPen Inject 8 Units into the skin in the morning and at bedtime.   metFORMIN (GLUCOPHAGE-XR) 500 MG 24 hr tablet Take 500 mg by mouth at bedtime.   metoprolol succinate (TOPROL-XL) 50 MG 24 hr tablet Take 1 tablet (50 mg total) by mouth daily. Take with or immediately following a meal.   pantoprazole (PROTONIX) 40 MG tablet Take 1 tablet (40 mg total) by mouth 2 (two) times daily.   polyethylene glycol (MIRALAX / GLYCOLAX) 17 g packet Take 17 g by mouth daily.   pregabalin (LYRICA) 50 MG capsule Take 50 mg by mouth 2 (two) times daily.   psyllium (HYDROCIL/METAMUCIL) 95 % PACK Take 1 packet by mouth daily as needed for mild constipation.   senna-docusate (SENOKOT-S) 8.6-50 MG tablet Take 2 tablets by mouth 2 (two) times daily.   sorbitol 70 % SOLN Take 30 mLs by mouth 2 (two) times daily as needed for moderate constipation.   spironolactone (ALDACTONE) 25 MG tablet Take 0.5 tablets (12.5 mg total) by mouth daily.   tamsulosin (FLOMAX) 0.4 MG CAPS capsule Take 0.4 mg by mouth at bedtime.   torsemide (DEMADEX) 10 MG tablet Take 1 tablet (10 mg total) by mouth daily.   TRESIBA FLEXTOUCH 100 UNIT/ML FlexTouch Pen Inject 20 Units into the skin at bedtime.   TRULICITY 1.5 UR/4.2HC SOPN Inject 1.5 mg into the skin every Sunday.   valsartan (DIOVAN) 160 MG tablet Take 1 tablet (160 mg total) by mouth daily.   venlafaxine XR (EFFEXOR-XR) 150 MG 24 hr capsule Take 150 mg by mouth 2 (two) times daily.   zaleplon (SONATA) 5 MG capsule Take 5 mg by mouth at bedtime.      Allergies:   Aspirin, Codeine, Naproxen, and Sulfonamide derivatives   Social History   Socioeconomic History   Marital status: Married    Spouse name: Bee Marchiano   Number of children: 2  Years of education: Not on file   Highest education level: Some college, no degree  Occupational History   Occupation: Retired    Fish farm manager: RETIRED  Tobacco Use   Smoking status: Never   Smokeless tobacco: Never  Vaping Use   Vaping Use: Never used  Substance and Sexual Activity   Alcohol use: No   Drug use: No   Sexual activity: Not Currently  Other Topics Concern   Not on file  Social History Narrative   Daily Caffeine, Coke   Social Determinants of Health   Financial Resource Strain: Low Risk    Difficulty of Paying Living Expenses: Not very hard  Food Insecurity: No Food Insecurity   Worried About Charity fundraiser in the Last Year: Never true   [place] in the Last Year: Never true  Transportation Needs: No Transportation Needs   Lack of Transportation (Medical): No   Lack of Transportation (Non-Medical): No  Physical Activity: Not on file  Stress: Not on file  Social Connections: Not on file     Family History: The patient's family history includes Breast cancer in her sister; Colon cancer in her maternal uncle; Colon polyps in her father; Diabetes in her maternal aunt; Heart disease in her father, maternal uncle, and another family member; Irritable bowel syndrome in her daughter; Ovarian cancer in her maternal aunt; Stomach cancer in her maternal aunt.  ROS:   Please see the history of present illness.     All other systems reviewed and are negative.  EKGs/Labs/Other Studies Reviewed:    The following studies were reviewed today:  Echo 07/25/2021  1. Left ventricular ejection fraction, by estimation, is 65 to 70%. The  left ventricle has normal function. The left ventricle has no regional  wall motion abnormalities. Left ventricular diastolic  parameters are  consistent with Grade II diastolic  dysfunction (pseudonormalization).   2. Right ventricular systolic function is normal. The right ventricular  size is normal.   3. The mitral valve is degenerative. No evidence of mitral valve  regurgitation. No evidence of mitral stenosis. Moderate mitral annular  calcification.   4. The aortic valve is tricuspid. Aortic valve regurgitation is not  visualized. Aortic valve sclerosis is present, with no evidence of aortic  valve stenosis.   5. The inferior vena cava is normal in size with <50% respiratory  variability, suggesting right atrial pressure of 8 mmHg.   Comparison(s): No significant change from prior study.   EKG:  EKG is ordered today.  The ekg ordered today demonstrates normal sinus rhythm, no significant ST-T wave changes  Recent Labs: 07/25/2021: TSH 1.900 07/29/2021: B Natriuretic Peptide 148.5 08/18/2021: ALT 57; Hemoglobin 11.9; Platelets 169 08/20/2021: Magnesium 1.8 09/05/2021: BUN 32; Creatinine, Ser 1.39; Potassium 4.3; Sodium 143  Recent Lipid Panel    Component Value Date/Time   CHOL 104 07/25/2021 0237   TRIG 87 07/25/2021 0237   HDL 26 (L) 07/25/2021 0237   CHOLHDL 4.0 07/25/2021 0237   VLDL 17 07/25/2021 0237   LDLCALC 61 07/25/2021 0237     Risk Assessment/Calculations:    CHA2DS2-VASc Score = 6   This indicates a 9.7% annual risk of stroke. The patient's score is based upon: CHF History: 1 HTN History: 1 Diabetes History: 1 Stroke History: 0 Vascular Disease History: 1 Age Score: 1 Gender Score: 1          Physical Exam:    VS:  BP 128/66    Pulse 65  Wt 177 lb 9.6 oz (80.6 kg)    SpO2 96%    BMI 29.55 kg/m     Wt Readings from Last 3 Encounters:  09/05/21 177 lb 9.6 oz (80.6 kg)  08/20/21 182 lb 12.2 oz (82.9 kg)  07/29/21 177 lb 11.2 oz (80.6 kg)     GEN:  Well nourished, well developed in no acute distress HEENT: Normal NECK: No JVD; No carotid bruits LYMPHATICS: No  lymphadenopathy CARDIAC: RRR, no murmurs, rubs, gallops RESPIRATORY:  Clear to auscultation without rales, wheezing or rhonchi  ABDOMEN: Soft, non-tender, non-distended MUSCULOSKELETAL:  No edema; No deformity  SKIN: Warm and dry NEUROLOGIC:  Alert and oriented x 3 PSYCHIATRIC:  Normal affect   ASSESSMENT:    1. Coronary artery disease involving native coronary artery of native heart without angina pectoris   2. AKI (acute kidney injury) ([place])   3. Essential hypertension   4. Hyperlipidemia LDL goal <70   5. Controlled type 2 diabetes mellitus without complication, without long-term current use of insulin ([place])   6. PAF (paroxysmal atrial fibrillation) (H[facility])    PLAN:    In order of problems listed above:  CAD: Denies any recent chest pain  AKI: Patient was initially admitted in December 2022 with heart failure symptom, she was discharged on 20 mg daily of torsemide.  Unfortunately she came back to the hospital in early January 2023 due to severe weakness, creatinine obtained at PCPs office the day prior to admission was 2.0, her baseline was 1.0.  By the time of discharge, her creatinine has reached her baseline.  She was discharged on 10 mg daily of torsemide instead.  Since discharge, she has repeated blood work at her PCPs office, creatinine began to go up again.  I will repeat basic metabolic panel.  At this time, she is on both torsemide and the spironolactone.  If her creatinine continues to go up, I likely will change torsemide to as needed.  Note her weight has went down 5 pounds from her hospital discharge weight which is concerning.  Hypertension: Blood pressure stable  Hyperlipidemia: On Lipitor  DM2: Managed per primary care provider  PAF: On Eliquis, amiodarone and metoprolol           Medication Adjustments/Labs and Tests Ordered: Current medicines are reviewed at length with the patient today.  Concerns regarding medicines are outlined above.  Orders Placed  This Encounter  Procedures   Basic metabolic panel   EKG [id number]   No orders of the defined types were placed in this encounter.   Patient Instructions  Medication Instructions:  Your physician recommends that you continue on your current medications as directed. Please refer to the Current Medication list given to you today.   *If you need a refill on your cardiac medications before your next appointment, please call your pharmacy*   Lab Work: Your physician recommends that you return for lab today  BMP  If you have labs (blood work) drawn today and your tests are completely normal, you will receive your results only by: [place] (if you have MyChart) OR A paper copy in the mail If you have any lab test that is abnormal or we need to change your treatment, we will call you to review the results.   Testing/Procedures: NONE ordered at this time of appointment     Follow-Up: At [facility], you and your health needs are our priority.  As part of our continuing mission to provide you with exceptional  heart care, we have created designated Provider Care Teams.  These Care Teams include your primary Cardiologist (physician) and Advanced Practice Providers (APPs -  Physician Assistants and Nurse Practitioners) who all work together to provide you with the care you need, when you need it.  We recommend signing up for the patient portal called "MyChart".  Sign up information is provided on this After Visit Summary.  MyChart is used to connect with patients for Virtual Visits (Telemedicine).  Patients are able to view lab/test results, encounter notes, upcoming appointments, etc.  Non-urgent messages can be sent to your provider as well.   To learn more about what you can do with MyChart, go to [email or web address].    Your next appointment:   Keep appointment 11/02/2021 with Dr. Debara Pickett   The format for your next appointment:   In Person  Provider:   Pixie Casino, MD      Other Instructions NONE ordered at this time of appointment     Signed, Almyra Deforest, [place]  09/07/2021 11:52 PM    [place]

## 2021-09-05 NOTE — Patient Instructions (Signed)
Medication Instructions:  Your physician recommends that you continue on your current medications as directed. Please refer to the Current Medication list given to you today.   *If you need a refill on your cardiac medications before your next appointment, please call your pharmacy*   Lab Work: Your physician recommends that you return for lab today  BMP  If you have labs (blood work) drawn today and your tests are completely normal, you will receive your results only by: [place] (if you have MyChart) OR A paper copy in the mail If you have any lab test that is abnormal or we need to change your treatment, we will call you to review the results.   Testing/Procedures: NONE ordered at this time of appointment     Follow-Up: At [facility], you and your health needs are our priority.  As part of our continuing mission to provide you with exceptional heart care, we have created designated Provider Care Teams.  These Care Teams include your primary Cardiologist (physician) and Advanced Practice Providers (APPs -  Physician Assistants and Nurse Practitioners) who all work together to provide you with the care you need, when you need it.  We recommend signing up for the patient portal called "MyChart".  Sign up information is provided on this After Visit Summary.  MyChart is used to connect with patients for Virtual Visits (Telemedicine).  Patients are able to view lab/test results, encounter notes, upcoming appointments, etc.  Non-urgent messages can be sent to your provider as well.   To learn more about what you can do with MyChart, go to [email or web address].    Your next appointment:   Keep appointment 11/02/2021 with Dr. Debara Pickett   The format for your next appointment:   In Person  Provider:   Pixie Casino, MD     Other Instructions NONE ordered at this time of appointment

## 2021-09-06 ENCOUNTER — Other Ambulatory Visit: Payer: Self-pay | Admitting: Physician Assistant

## 2021-09-06 DIAGNOSIS — N179 Acute kidney failure, unspecified: Secondary | ICD-10-CM

## 2021-09-06 LAB — BASIC METABOLIC PANEL
BUN/Creatinine Ratio: 23 (ref 12–28)
BUN: 32 mg/dL — ABNORMAL HIGH (ref 8–27)
CO2: 27 mmol/L (ref 20–29)
Calcium: 9.3 mg/dL (ref 8.7–10.3)
Chloride: 99 mmol/L (ref 96–106)
Creatinine, Ser: 1.39 mg/dL — ABNORMAL HIGH (ref 0.57–1.00)
Glucose: 157 mg/dL — ABNORMAL HIGH (ref 70–99)
Potassium: 4.3 mmol/L (ref 3.5–5.2)
Sodium: 143 mmol/L (ref 134–144)
eGFR: 41 mL/min/{1.73_m2} — ABNORMAL LOW (ref >59)

## 2021-09-07 ENCOUNTER — Encounter: Payer: Self-pay | Admitting: Physician Assistant

## 2021-09-11 ENCOUNTER — Ambulatory Visit (INDEPENDENT_AMBULATORY_CARE_PROVIDER_SITE_OTHER): Payer: Medicare Other | Admitting: Gastroenterology

## 2021-09-11 ENCOUNTER — Encounter: Payer: Self-pay | Admitting: Gastroenterology

## 2021-09-11 VITALS — BP 130/62 | HR 58 | Ht 63.0 in | Wt 177.4 lb

## 2021-09-11 DIAGNOSIS — D508 Other iron deficiency anemias: Secondary | ICD-10-CM

## 2021-09-11 DIAGNOSIS — K529 Noninfective gastroenteritis and colitis, unspecified: Secondary | ICD-10-CM | POA: Diagnosis not present

## 2021-09-11 DIAGNOSIS — R198 Other specified symptoms and signs involving the digestive system and abdomen: Secondary | ICD-10-CM | POA: Diagnosis not present

## 2021-09-11 DIAGNOSIS — Z86010 Personal history of colonic polyps: Secondary | ICD-10-CM

## 2021-09-11 DIAGNOSIS — K317 Polyp of stomach and duodenum: Secondary | ICD-10-CM

## 2021-09-11 MED ORDER — METRONIDAZOLE 500 MG PO TABS: 500.0000 mg | ORAL_TABLET | Freq: Two times a day (BID) | ORAL | 0 refills | Status: DC

## 2021-09-11 NOTE — Patient Instructions (Addendum)
If you are age 70 or older, your body mass index should be between 23-30. Your Body mass index is 31.42 kg/m. If this is out of the aforementioned range listed, please consider follow up with your Primary Care Provider.  If you are age 51 or younger, your body mass index should be between 19-25. Your Body mass index is 31.42 kg/m. If this is out of the aformentioned range listed, please consider follow up with your Primary Care Provider.   ________________________________________________________  The [place] providers would like to encourage you to use [facility] to communicate with providers for non-urgent requests or questions.  Due to long hold times on the telephone, sending your provider a message by [facility] may be a faster and more efficient way to get a response.  Please allow 48 business hours for a response.  Please remember that this is for non-urgent requests.  _______________________________________________________  Stop Miralax. Stop Senokot.  Stop Metformin for a week .  Start Flagyl 500 mg, twice a day for 10 days.   Return in 6 weeks for a follow up.   It was a pleasure to see you today!  Thank you for trusting me with your gastrointestinal care!

## 2021-09-11 NOTE — Progress Notes (Addendum)
Normal Gastroenterology progress note:  History: Alexis [name] 09/11/2021  Referring provider: Deland Pretty, MD  Reason for consult/chief complaint: Diarrhea ("Watery stools" after eating meals.  Patient states if she does produce a stool "it looks like a small ball.")   Subjective  HPI: Alexis [name] is seen in follow-up after recent hospitalizations.  In mid December she was hospitalized for multiple issues including iron deficiency anemia and diarrhea.  Dr. Havery [name] performed EGD and colonoscopy.  Multiple small adenomatous colon polyps removed, biopsies negative for microscopic colitis.  A friable gastric hyperplastic polyp was removed as it was suspected to be probable source of anemia blood loss. She was rehospitalized earlier this month with generalized weakness, acute on chronic renal failure and volume depletion -there have been changes to her diuretic regimen after the December hospital stay.  It was felt her diarrhea was most likely constipation with "overflow", and she was placed on a bowel regimen.  Renal function improved with treatment.  Vanity complains of same bowel habit difficulty as when I seen her last November.  For well over 2 years she has had postprandial urgency and semiformed to loose stool most days.  She has not noticed any clear food or medicine triggers.  Unclear how long she has been on metformin, but recalls when the dose was increased she had more diarrhea. She had many questions about the hospital stay and test results, all of which were answered with detailed explanation. She denies rectal bleeding,, dysphagia, or vomiting.  Her weight has been variable depending on diuretic use and fluid status.  ROS:  Review of Systems  Constitutional:  Positive for fatigue. Negative for appetite change and unexpected weight change.  HENT:  Negative for mouth sores and voice change.   Eyes:  Negative for pain and redness.  Respiratory:  Negative for cough and  shortness of breath.   Cardiovascular:  Negative for chest pain and palpitations.  Genitourinary:  Negative for dysuria and hematuria.  Musculoskeletal:  Positive for arthralgias. Negative for myalgias.  Skin:  Negative for pallor and rash.  Neurological:  Negative for weakness and headaches.  Hematological:  Negative for adenopathy.  Psychiatric/Behavioral:         Anxiety    Past Medical History: Past Medical History:  Diagnosis Date   Anxiety    Arthritis    "knees" (05/25/2014)   Chronic back pain    Depression    Diabetes mellitus type II    Diabetic peripheral neuropathy ([place]) 10/20/2018   Diverticulitis    Fatty liver    Fibromyalgia    Gastric polyp    hyperplastic   Gastroparesis    "recently dx'd" (05/25/2014)   GERD (gastroesophageal reflux disease)    H/O hiatal hernia    Hx of gastritis    Hyperlipidemia    Hypertension    Irritable bowel syndrome (IBS)    Memory difficulties 09/18/2017   Migraine without aura, without mention of intractable migraine without mention of status migrainosus    "related to allergies; have them in the spring and fall" (05/25/2014)   Obesity    Sleep apnea    "wore mask; took it off in my sleep; quit wearing it" (05/25/2014)   Uterine cancer ([place]) dx'd 2000   surg only   Vasculitis ([place])    "irritates my legs"   Wears glasses      Past Surgical History: Past Surgical History:  Procedure Laterality Date   ANTERIOR CERVICAL DECOMPRESSION/DISCECTOMY FUSION 4 LEVELS N/A 05/07/2018  Procedure: ANTERIOR CERVICAL DECOMPRESSION FUSION, CERVICAL 4-5,CERVICAL 5-6, CERVICAL 6-7 WITH INSTRUMENTATION AND ALLOGRAFT;  Surgeon: Phylliss Bob, MD;  Location: [facility];  Service: Orthopedics;  Laterality: N/A;   APPENDECTOMY  ~ 1967   BIOPSY  07/27/2021   Procedure: BIOPSY;  Surgeon: Yetta Flock, MD;  Location: [facility];  Service: Gastroenterology;;   BREAST CYST EXCISION Right 1990   COLONOSCOPY     COLONOSCOPY WITH PROPOFOL  N/A 07/27/2021   Procedure: COLONOSCOPY WITH PROPOFOL;  Surgeon: Yetta Flock, MD;  Location: [facility];  Service: Gastroenterology;  Laterality: N/A;   ESOPHAGOGASTRODUODENOSCOPY (EGD) WITH PROPOFOL N/A 07/27/2021   Procedure: ESOPHAGOGASTRODUODENOSCOPY (EGD) WITH PROPOFOL;  Surgeon: Yetta Flock, MD;  Location: [facility];  Service: Gastroenterology;  Laterality: N/A;   HEMOSTASIS CLIP PLACEMENT  07/27/2021   Procedure: HEMOSTASIS CLIP PLACEMENT;  Surgeon: Yetta Flock, MD;  Location: [facility] ENDOSCOPY;  Service: Gastroenterology;;   [place] WITH CORONARY ANGIOGRAM N/A 05/26/2014   Procedure: LEFT HEART CATHETERIZATION WITH CORONARY ANGIOGRAM;  Surgeon: Troy Sine, MD;  Location: [facility] CATH LAB;  Service: Cardiovascular;  Laterality: N/A;   POLYPECTOMY  07/27/2021   Procedure: POLYPECTOMY;  Surgeon: Yetta Flock, MD;  Location: [facility];  Service: Gastroenterology;;   Alexis [name]  07/27/2021   Procedure: Alexis [name];  Surgeon: Yetta Flock, MD;  Location[facility] ENDOSCOPY;  Service: Gastroenterology;;   SINUS SURGERY WITH INSTATRAK  2000   TOTAL HIP ARTHROPLASTY Left 12/05/2020   Procedure: TOTAL HIP ARTHROPLASTY ANTERIOR APPROACH;  Surgeon: Rod Can, MD;  Location: [facility];  Service: Orthopedics;  Laterality: Left;   TUBAL LIGATION  ~ 1982   VAGINAL HYSTERECTOMY  2000     Family History: Family History  Problem Relation Age of Onset   Breast cancer Sister    Colon polyps Father    Heart disease Father    Colon cancer Maternal Uncle    Ovarian cancer Maternal Aunt    Stomach cancer Maternal Aunt    Diabetes Maternal Aunt    Heart disease Maternal Uncle    Heart disease Other        Grandparents   Irritable bowel syndrome Daughter     Social History: Social History   Socioeconomic History   Marital status: Married    Spouse name: Alexis [name]   Number of children: 2    Years of education: Not on file   Highest education level: Some college, no degree  Occupational History   Occupation: Retired    Fish farm manager: RETIRED  Tobacco Use   Smoking status: Never   Smokeless tobacco: Never  Vaping Use   Vaping Use: Never used  Substance and Sexual Activity   Alcohol use: No   Drug use: No   Sexual activity: Not Currently  Other Topics Concern   Not on file  Social History Narrative   Daily Caffeine, Coke   Social Determinants of Health   Financial Resource Strain: Low Risk    Difficulty of Paying Living Expenses: Not very hard  Food Insecurity: No Food Insecurity   Worried About Charity fundraiser in the Last Year: Never true   [place] in the Last Year: Never true  Transportation Needs: No Transportation Needs   Lack of Transportation (Medical): No   Lack of Transportation (Non-Medical): No  Physical Activity: Not on file  Stress: Not on file  Social Connections: Not on file    Allergies: Allergies  Allergen Reactions  Aspirin Other (See Comments)    Reaction: Vasculitis per MD   Codeine Itching and Rash    Tolerated Norco 12/07/20 with no reports of rash/itching. Patient endorsed allergy >30 yr ago and hasn't had reaction since.   Naproxen Nausea Only    Reaction: Makes stomach upset/irritated.   Sulfonamide Derivatives Itching and Rash    Outpatient Meds: Current Outpatient Medications  Medication Sig Dispense Refill   acetaminophen (TYLENOL) 325 MG tablet Take 2 tablets (650 mg total) by mouth every 6 (six) hours as needed for mild pain or moderate pain.     ALPRAZolam (XANAX) 0.5 MG tablet Take 0.5 mg by mouth at bedtime.     amiodarone (PACERONE) 200 MG tablet Take 1 tablet (200 mg total) by mouth daily. 90 tablet 3   apixaban (ELIQUIS) 5 MG TABS tablet Take 1 tablet (5 mg total) by mouth 2 (two) times daily. 60 tablet    atorvastatin (LIPITOR) 20 MG tablet Take 1 tablet (20 mg total) by mouth daily. 30 tablet 2   b  complex vitamins tablet Take 1 tablet by mouth at bedtime.     CALCIUM PO Take 2 tablets by mouth at bedtime.     FARXIGA 10 MG TABS tablet Take 10 mg by mouth at bedtime.     fish oil-omega-3 fatty acids 1000 MG capsule Take 1 g by mouth at bedtime.      insulin lispro (HUMALOG) 100 UNIT/ML KwikPen Inject 8 Units into the skin in the morning and at bedtime.     metFORMIN (GLUCOPHAGE-XR) 500 MG 24 hr tablet Take 500 mg by mouth at bedtime.     metoprolol succinate (TOPROL-XL) 50 MG 24 hr tablet Take 1 tablet (50 mg total) by mouth daily. Take with or immediately following a meal.     pantoprazole (PROTONIX) 40 MG tablet Take 1 tablet (40 mg total) by mouth 2 (two) times daily. 60 tablet 2   polyethylene glycol (MIRALAX / GLYCOLAX) 17 g packet Take 17 g by mouth daily. 14 each 0   pregabalin (LYRICA) 50 MG capsule Take 50 mg by mouth 2 (two) times daily.     psyllium (HYDROCIL/METAMUCIL) 95 % PACK Take 1 packet by mouth daily as needed for mild constipation. 240 each    senna-docusate (SENOKOT-S) 8.6-50 MG tablet Take 2 tablets by mouth 2 (two) times daily.     spironolactone (ALDACTONE) 25 MG tablet Take 0.5 tablets (12.5 mg total) by mouth daily. 30 tablet 2   tamsulosin (FLOMAX) 0.4 MG CAPS capsule Take 0.4 mg by mouth at bedtime.  3   TRESIBA FLEXTOUCH 100 UNIT/ML FlexTouch Pen Inject 20 Units into the skin at bedtime.     TRULICITY 1.5 EP/3.2RJ SOPN Inject 1.5 mg into the skin every Sunday.     valsartan (DIOVAN) 160 MG tablet Take 1 tablet (160 mg total) by mouth daily. 30 tablet 3   venlafaxine XR (EFFEXOR-XR) 150 MG 24 hr capsule Take 150 mg by mouth 2 (two) times daily.     zaleplon (SONATA) 5 MG capsule Take 5 mg by mouth at bedtime.     No current facility-administered medications for this visit.      ___________________________________________________________________ Objective   Exam:  BP 130/62    Pulse (!) 58    Ht 5\' 3"  (1.6 m)    Wt 177 lb 6 oz (80.5 kg)    SpO2 99%     BMI 31.42 kg/m  Wt Readings from Last 3 Encounters:  09/11/21  177 lb 6 oz (80.5 kg)  09/05/21 177 lb 9.6 oz (80.6 kg)  08/20/21 182 lb 12.2 oz (82.9 kg)    General: Well-appearing, gets on exam table without assistance Eyes: sclera anicteric, no redness ENT: oral mucosa moist without lesions, no cervical or supraclavicular lymphadenopathy CV: RRR without murmur, S1/S2, no JVD, no peripheral edema Resp: clear to auscultation bilaterally, normal RR and effort noted GI: soft, no tenderness, with active bowel sounds. No guarding or palpable organomegaly noted. Skin; warm and dry, no rash or jaundice noted Neuro: awake, alert and oriented x 3. Normal gross motor function and fluent speech  Labs:  CBC Latest Ref Rng & Units 08/18/2021 08/17/2021 08/09/2021  WBC 4.0 - 10.5 K/uL 8.8 9.4 11.4(H)  Hemoglobin 12.0 - 15.0 g/dL 11.9(L) 12.6 12.4  Hematocrit 36.0 - 46.0 % 38.2 40.2 39.0  Platelets 150 - 400 K/uL 169 193 330.0   CMP Latest Ref Rng & Units 09/05/2021 08/20/2021 08/19/2021  Glucose 70 - 99 mg/dL 157(H) 111(H) 180(H)  BUN 8 - 27 mg/dL 32(H) 24(H) 51(H)  Creatinine 0.57 - 1.00 mg/dL 1.39(H) 1.00 1.38(H)  Sodium 134 - 144 mmol/L 143 141 141  Potassium 3.5 - 5.2 mmol/L 4.3 4.0 4.2  Chloride 96 - 106 mmol/L 99 108 109  CO2 20 - 29 mmol/L 27 26 25   Calcium 8.7 - 10.3 mg/dL 9.3 8.7(L) 8.5(L)  Total Protein 6.5 - 8.1 g/dL - - -  Total Bilirubin 0.3 - 1.2 mg/dL - - -  Alkaline Phos 38 - 126 U/L - - -  AST 15 - 41 U/L - - -  ALT 0 - 44 U/L - - -   Hepatic function panel normal 07/25/2021  Inpatient labs:  08/17/2021: AST 58, ALT 65, alkaline phosphatase 184, total bilirubin 0.4 08/18/2021: AST 47, ALT 57, alkaline phosphatase 164, total bilirubin 0.3     EGD and colonoscopy reports were reviewed and case discussed with Dr. Havery [name] after that hospital discharge.  Pathology reports also reviewed and are on file.   CLINICAL DATA:  Abdominal pain and nausea, recent colonoscopy  with biopsy.   EXAM: CT ABDOMEN AND PELVIS WITHOUT CONTRAST   TECHNIQUE: Multidetector CT imaging of the abdomen and pelvis was performed following the standard protocol without IV contrast.   COMPARISON:  08/09/2014.   FINDINGS: Lower chest: The heart is mildly enlarged and coronary artery calcifications are noted. Mild atelectasis is noted at the lung bases.   Hepatobiliary: The liver is slightly hyperdense in attenuation. No biliary ductal dilatation. The gallbladder is surgically absent.   Pancreas: Unremarkable. No pancreatic ductal dilatation or surrounding inflammatory changes.   Spleen: Normal in size without focal abnormality.   Adrenals/Urinary Tract: No adrenal nodule or mass. Nonobstructive calculi are present in the lower pole of the right kidney. No obstructive uropathy is seen bilaterally. The visualized portion of the bladder is within normal limits. Examination is slightly limited due to hardware artifact.   Stomach/Bowel: The stomach is within normal limits. No bowel obstruction, free air or pneumatosis. No focal bowel wall thickening. A moderate amount of retained stool is present in the colon. The appendix is not visualized on exam and there are no inflammatory changes in the right lower quadrant.   Vascular/Lymphatic: Aortic atherosclerosis. No enlarged abdominal or pelvic lymph nodes.   Reproductive: Status post hysterectomy. No adnexal masses.   Other: No free fluid. A small fat containing umbilical hernia is noted.   Musculoskeletal: Total hip arthroplasty changes are noted on the  left. Mild degenerative changes are present in the lumbar spine. No acute osseous abnormality.   IMPRESSION: 1. No bowel obstruction or free air. 2. Moderate amount of retained stool in the colon suggesting constipation. 3. Cardiomegaly with coronary artery calcifications. 4. Aortic atherosclerosis. 5. Nonobstructive right renal calculi.     Electronically  Signed   By: Brett Fairy M.D.   On: 08/18/2021 04:26   Assessment: Encounter Diagnoses  Name Primary?   Chronic diarrhea Yes   Other iron deficiency anemia    Tenesmus    History of colonic polyps    Gastric polyp     Her chronic diarrhea does not appear to be overflow from chronic constipation, despite CT scan report.  I think it is most likely IBS, possibly some medicine side effect (?  Metformin), possibly SIBO (suboptimally controlled diabetes with endorgan complications).  Her iron deficiency anemia had an apparent chronic blood loss component believed to be from a gastric polyp that is since been resected, exacerbated by chronic use of oral anticoagulation.  I also believe a significant component of it is from CKD.  Recommend return to primary care for management with IV iron or referral to hematology at their discretion.  Stop MiraLAX, Senokot and Protonix  Stop metformin for a week and see if any improvement in diarrhea.  (Requested she call or send a portal message with the outcome of that)  Empiric trial of possible SIBO with metronidazole 500 mg, 1 tablet twice daily for 10 days (Rifaximin typically prohibitively expensive with Medicare patients)  If no success with above, cautious trial of dicyclomine, though I am trying to minimize medicine use if possible and complex patient with polypharmacy.  Return to office about 6 weeks  Surveillance colonoscopy in 3 years.   42 minutes were spent on this encounter (including chart review, history/exam, counseling/coordination of care, and documentation) > 50% of that time was spent on counseling and coordination of care.   Nelida Meuse III  CC: Referring provider noted above  Record review addendum 10/03/2021  Primary care labs dated 08/23/2021  BUN 23, creatinine 1.2, sodium 143, potassium 3.7  AST 68, ALT 81, alkaline phosphatase 141, total bilirubin 0.4, GGT 66  H Danis, MD

## 2021-10-01 NOTE — Progress Notes (Signed)
PATIENT: Alexis [name] DOB: July 29, 1951  REASON FOR VISIT: Follow up for peripheral neuropathy, cervical myelopathy, gait disorder HISTORY FROM: Patient, alone today PRIMARY NEUROLOGIST: Dr. Leta Baptist   HISTORY OF PRESENT ILLNESS: Today 10/02/21 Alexis [name] here today for follow-up.  Recent hospitalizations for diastolic CHF, acute blood loss GI bleed. Fractured her left hip last may from a fall, broke bone in right foot after a fall in Dec 2022. Fall in the hospital fell forward using the bathroom, bruised ribs from husband trying to help her up. Uses a walker in the house, can't use a cane because of the tremor, tremor to both hands intermittently, related to amiodarone? Was going to start PT after hospital discharge but that was at time of foot injury, boot is off now, has follow up coming up to see if therapy can be continued. Feels like when she walks, she is forward leaning. 2 weeks ago fell off the toilet trying to zap a bug leaning forward. PCP is filling the Lyrica for her. With memory can't find the words at times. CT head Jan 2023 showed mild atrophy, chronic microvascular ischemic changes. Pays her bills on her phone, husband drives her, she does her own ADLs, she doesn't cook much on her own, the recipes get jumbled.   HISTORY  04/03/2021 Dr. Jannifer Franklin: Alexis [name] is a 71 year old right-handed white female with a history of diabetes that has in the past been poorly controlled.  She has a underlying peripheral neuropathy, she takes Lyrica for some of the discomfort.  She has a history of cervical spine surgery with a prior cervical myelopathy and an underlying gait disorder.  On 04 December 2020, she fell at home when she tripped over a power cord.  She fractured her left hip, she required some therapy in an extended care facility and is now getting outpatient physical therapy.  She feels as if the hip flexion on the left is somewhat weak.  She has not had any further falls.  She is  not using a cane or a walker for ambulation at this point.  She is doing some exercises with the legs.  She recently over the last couple months has noted some tremor in both arms, she has had some trouble with sloppy handwriting.  She was placed on amiodarone during the recent hospitalization as she went to the atrial fibrillation, she is now on Eliquis as well.  The patient reports some ongoing mild memory issues, she mainly has word finding problems but otherwise has not altered any of her activities of daily living because of memory.  She returns to this office for an evaluation.  REVIEW OF SYSTEMS: Out of a complete 14 system review of symptoms, the patient complains only of the following symptoms, and all other reviewed systems are negative.  See HPI  ALLERGIES: Allergies  Allergen Reactions   Aspirin Other (See Comments)    Reaction: Vasculitis per MD   Codeine Itching and Rash    Tolerated Norco 12/07/20 with no reports of rash/itching. Patient endorsed allergy >30 yr ago and hasn't had reaction since.   Naproxen Nausea Only    Reaction: Makes stomach upset/irritated.   Sulfonamide Derivatives Itching and Rash    HOME MEDICATIONS: Outpatient Medications Prior to Visit  Medication Sig Dispense Refill   acetaminophen (TYLENOL) 325 MG tablet Take 2 tablets (650 mg total) by mouth every 6 (six) hours as needed for mild pain or moderate pain.     ALPRAZolam (XANAX) 0.5  MG tablet Take 0.5 mg by mouth at bedtime.     amiodarone (PACERONE) 200 MG tablet Take 1 tablet (200 mg total) by mouth daily. 90 tablet 3   apixaban (ELIQUIS) 5 MG TABS tablet Take 1 tablet (5 mg total) by mouth 2 (two) times daily. 60 tablet    atorvastatin (LIPITOR) 20 MG tablet Take 1 tablet (20 mg total) by mouth daily. 30 tablet 2   b complex vitamins tablet Take 1 tablet by mouth at bedtime.     CALCIUM PO Take 2 tablets by mouth at bedtime.     FARXIGA 10 MG TABS tablet Take 10 mg by mouth at bedtime.     fish  oil-omega-3 fatty acids 1000 MG capsule Take 1 g by mouth at bedtime.      insulin lispro (HUMALOG) 100 UNIT/ML KwikPen Inject 8 Units into the skin in the morning and at bedtime.     metoprolol succinate (TOPROL-XL) 50 MG 24 hr tablet Take 1 tablet (50 mg total) by mouth daily. Take with or immediately following a meal.     pantoprazole (PROTONIX) 40 MG tablet Take 1 tablet (40 mg total) by mouth 2 (two) times daily. 60 tablet 2   pregabalin (LYRICA) 50 MG capsule Take 50 mg by mouth 2 (two) times daily.     spironolactone (ALDACTONE) 25 MG tablet Take 0.5 tablets (12.5 mg total) by mouth daily. 30 tablet 2   TRESIBA FLEXTOUCH 100 UNIT/ML FlexTouch Pen Inject 20 Units into the skin at bedtime.     TRULICITY 1.5 PY/1.9JK SOPN Inject 1.5 mg into the skin every Sunday.     valsartan (DIOVAN) 160 MG tablet Take 1 tablet (160 mg total) by mouth daily. 30 tablet 3   venlafaxine XR (EFFEXOR-XR) 150 MG 24 hr capsule Take 150 mg by mouth 2 (two) times daily.     zaleplon (SONATA) 5 MG capsule Take 5 mg by mouth at bedtime.     metFORMIN (GLUCOPHAGE-XR) 500 MG 24 hr tablet Take 500 mg by mouth at bedtime.     metroNIDAZOLE (FLAGYL) 500 MG tablet Take 1 tablet (500 mg total) by mouth 2 (two) times daily. 20 tablet 0   polyethylene glycol (MIRALAX / GLYCOLAX) 17 g packet Take 17 g by mouth daily. 14 each 0   psyllium (HYDROCIL/METAMUCIL) 95 % PACK Take 1 packet by mouth daily as needed for mild constipation. 240 each    senna-docusate (SENOKOT-S) 8.6-50 MG tablet Take 2 tablets by mouth 2 (two) times daily.     tamsulosin (FLOMAX) 0.4 MG CAPS capsule Take 0.4 mg by mouth at bedtime.  3   No facility-administered medications prior to visit.    PAST MEDICAL HISTORY: Past Medical History:  Diagnosis Date   Anxiety    Arthritis    "knees" (05/25/2014)   Chronic back pain    Depression    Diabetes mellitus type II    Diabetic peripheral neuropathy ([place]) 10/20/2018   Diverticulitis    Fatty liver     Fibromyalgia    Gastric polyp    hyperplastic   Gastroparesis    "recently dx'd" (05/25/2014)   GERD (gastroesophageal reflux disease)    H/O hiatal hernia    Hx of gastritis    Hyperlipidemia    Hypertension    Irritable bowel syndrome (IBS)    Memory difficulties 09/18/2017   Migraine without aura, without mention of intractable migraine without mention of status migrainosus    "related to allergies; have them in  the spring and fall" (05/25/2014)   Obesity    Sleep apnea    "wore mask; took it off in my sleep; quit wearing it" (05/25/2014)   Uterine cancer ([place]) dx'd 2000   surg only   Vasculitis ([place])    "irritates my legs"   Wears glasses     PAST SURGICAL HISTORY: Past Surgical History:  Procedure Laterality Date   ANTERIOR CERVICAL DECOMPRESSION/DISCECTOMY FUSION 4 LEVELS N/A 05/07/2018   Procedure: ANTERIOR CERVICAL DECOMPRESSION FUSION, CERVICAL 4-5,CERVICAL 5-6, CERVICAL 6-7 WITH INSTRUMENTATION AND ALLOGRAFT;  Surgeon: Phylliss Bob, MD;  Location: [facility];  Service: Orthopedics;  Laterality: N/A;   APPENDECTOMY  ~ 1967   BIOPSY  07/27/2021   Procedure: BIOPSY;  Surgeon: Yetta Flock, MD;  Location: [facility];  Service: Gastroenterology;;   BREAST CYST EXCISION Right 1990   COLONOSCOPY     COLONOSCOPY WITH PROPOFOL N/A 07/27/2021   Procedure: COLONOSCOPY WITH PROPOFOL;  Surgeon: Yetta Flock, MD;  Location: [facility];  Service: Gastroenterology;  Laterality: N/A;   ESOPHAGOGASTRODUODENOSCOPY (EGD) WITH PROPOFOL N/A 07/27/2021   Procedure: ESOPHAGOGASTRODUODENOSCOPY (EGD) WITH PROPOFOL;  Surgeon: Yetta Flock, MD;  Location: [facility];  Service: Gastroenterology;  Laterality: N/A;   HEMOSTASIS CLIP PLACEMENT  07/27/2021   Procedure: HEMOSTASIS CLIP PLACEMENT;  Surgeon: Yetta Flock, MD;  Location: [facility];  Service: Gastroenterology;;   [place] WITH CORONARY ANGIOGRAM  N/A 05/26/2014   Procedure: LEFT HEART CATHETERIZATION WITH CORONARY ANGIOGRAM;  Surgeon: Troy Sine, MD;  Location[facility] C[facility] LAB;  Service: Cardiovascular;  Laterality: N/A;   POLYPECTOMY  07/27/2021   Procedure: POLYPECTOMY;  Surgeon: Yetta Flock, MD;  Location: [facility];  Service: Gastroenterology;;   Clide Deutscher  07/27/2021   Procedure: Clide Deutscher;  Surgeon: Yetta Flock, MD;  Location[facility] END[facility]COPY;  Service: Gastroenterology;;   SINUS SURGERY WITH INSTATRAK  2000   TOTAL HIP ARTHROPLASTY Left 12/05/2020   Procedure: TOTAL HIP ARTHROPLASTY ANTERIOR APPROACH;  Surgeon: Rod Can, MD;  Location: [facility];  Service: Orthopedics;  Laterality: Left;   TUBAL LIGATION  ~ 1982   VAGINAL HYSTERECTOMY  2000    FAMILY HISTORY: Family History  Problem Relation Age of Onset   Breast cancer Sister    Colon polyps Father    Heart disease Father    Colon cancer Maternal Uncle    Ovarian cancer Maternal Aunt    Stomach cancer Maternal Aunt    Diabetes Maternal Aunt    Heart disease Maternal Uncle    Heart disease Other        Grandparents   Irritable bowel syndrome Daughter     SOCIAL HISTORY: Social History   Socioeconomic History   Marital status: Married    Spouse name: Travis Purk   Number of children: 2   Years of education: Not on file   Highest education level: Some college, no degree  Occupational History   Occupation: Retired    Fish farm manager: RETIRED  Tobacco Use   Smoking status: Never   Smokeless tobacco: Never  Vaping Use   Vaping Use: Never used  Substance and Sexual Activity   Alcohol use: No   Drug use: No   Sexual activity: Not Currently  Other Topics Concern   Not on file  Social History Narrative   Daily Caffeine, Coke   Social Determinants of Health   Financial Resource Strain: Low Risk    Difficulty of Paying Living Expenses: Not very hard  Food Insecurity: No Food Insecurity   Worried About Charity fundraiser in the  Last Year: Never true   Ran Out of Food in the Last Year: Never true  Transportation Needs: No Transportation Needs   Lack of Transportation (Medical): No   Lack of Transportation (Non-Medical): No  Physical Activity: Not on file  Stress: Not on file  Social Connections: Not on file  Intimate Partner Violence: Not on file   PHYSICAL EXAM  Vitals:   10/02/21 1517  BP: (!) 171/89  Pulse: 60  Weight: 177 lb (80.3 kg)  Height: 5\' 3"  (1.6 m)   Body mass index is 31.35 kg/m.  Generalized: Well developed, in no acute distress  MMSE - Mini Mental State Exam 10/02/2021 04/03/2021 07/19/2020  Orientation to time 5 4 5   Orientation to Place 5 5 5   Registration 3 3 3   Attention/ Calculation 4 4 5   Recall 3 2 2   Language- name 2 objects 2 2 2   Language- repeat 1 1 1   Language- follow 3 step command 3 3 3   Language- read & follow direction 1 1 1   Write a sentence 1 1 1   Copy design 0 1 1  Copy design-comments - - -  Total score 28 27 29     Neurological examination  Mentation: Alert oriented to time, place, history taking. Follows all commands speech and language fluent Cranial nerve II-XII: Pupils were equal round reactive to light. Extraocular movements were full, visual field were full on confrontational test. Facial sensation and strength were normal.  Head turning and shoulder shrug  were normal and symmetric. Motor: The motor testing reveals 5 over 5 strength of all 4 extremities. Good symmetric motor tone is noted throughout. No tremor noted.  Sensory: Sensory testing is intact to soft touch on all 4 extremities. No evidence of extinction is noted.  Coordination: Cerebellar testing reveals good finger-nose-finger and heel-to-shin bilaterally.  With handwriting sample, there was no tremor translated, but does press down firmly Gait and station: Gait is slightly wide-based, but steady and independent Reflexes: Deep tendon reflexes are symmetric and normal bilaterally.   DIAGNOSTIC  DATA (LABS, IMAGING, TESTING) - I reviewed patient records, labs, notes, testing and imaging myself where available.  Lab Results  Component Value Date   WBC 8.8 08/18/2021   HGB 11.9 (L) 08/18/2021   HCT 38.2 08/18/2021   MCV 83.8 08/18/2021   PLT 169 08/18/2021      Component Value Date/Time   NA 143 09/05/2021 1531   K 4.3 09/05/2021 1531   CL 99 09/05/2021 1531   CO2 27 09/05/2021 1531   GLUCOSE 157 (H) 09/05/2021 1531   GLUCOSE 111 (H) 08/20/2021 0541   BUN 32 (H) 09/05/2021 1531   CREATININE 1.39 (H) 09/05/2021 1531   CALCIUM 9.3 09/05/2021 1531   PROT 6.8 08/18/2021 1050   PROT 7.2 01/29/2021 1252   ALBUMIN 3.2 (L) 08/18/2021 1050   ALBUMIN 4.0 01/29/2021 1252   AST 47 (H) 08/18/2021 1050   ALT 57 (H) 08/18/2021 1050   ALKPHOS 164 (H) 08/18/2021 1050   BILITOT 0.3 08/18/2021 1050   BILITOT 0.4 01/29/2021 1252   GFRNONAA >60 08/20/2021 0541   GFRAA >60 05/07/2018 0642   Lab Results  Component Value Date   CHOL 104 07/25/2021   HDL 26 (L) 07/25/2021   LDLCALC 61 07/25/2021   TRIG 87 07/25/2021   CHOLHDL 4.0 07/25/2021   Lab Results  Component Value Date   HGBA1C 8.8 (H)  08/18/2021   Lab Results  Component Value Date   VITAMINB12 1,011 (H) 07/25/2021   Lab Results  Component Value Date   TSH 1.900 07/25/2021   ASSESSMENT AND PLAN 70 y.o. year old female   1.  Diabetic peripheral neuropathy 2.  Cervical myelopathy 3.  Gait disorder 4.  Mild cognitive impairment  -Hospitalization for CHF in January, multiple co-morbidities, multiple fall history -MMSE 28/30 today, overall stable, will follow conservatively again in 6 months -Encouraged to get started in physical therapy, when she is cleared from her foot injury -On Lyrica for neuropathy from PCP -Tremor was not appreciated on exam today, historically is intermittent, could be related to amiodarone? -Will see back in 6 months, if she remains stable, will return back to PCP return here as  needed  Butler Denmark, Laqueta Jean, DNP 10/02/2021, 4:21 PM [facility] [address] [place], [place] [address] [phone]

## 2021-10-02 ENCOUNTER — Ambulatory Visit (INDEPENDENT_AMBULATORY_CARE_PROVIDER_SITE_OTHER): Payer: Medicare Other | Admitting: Neurology

## 2021-10-02 VITALS — BP 171/89 | HR 60 | Ht 63.0 in | Wt 177.0 lb

## 2021-10-02 DIAGNOSIS — G959 Disease of spinal cord, unspecified: Secondary | ICD-10-CM

## 2021-10-02 DIAGNOSIS — R413 Other amnesia: Secondary | ICD-10-CM

## 2021-10-02 DIAGNOSIS — E1142 Type 2 diabetes mellitus with diabetic polyneuropathy: Secondary | ICD-10-CM

## 2021-10-02 NOTE — Patient Instructions (Signed)
Memory score is stable, will follow overtime On Lyrica from PCP Get back in PT once cleared from foot injury  Continue to see PCP Return back in 6 months

## 2021-10-30 ENCOUNTER — Other Ambulatory Visit: Payer: Self-pay

## 2021-10-30 DIAGNOSIS — N179 Acute kidney failure, unspecified: Secondary | ICD-10-CM

## 2021-10-31 LAB — BASIC METABOLIC PANEL
BUN/Creatinine Ratio: 20 (ref 12–28)
BUN: 20 mg/dL (ref 8–27)
CO2: 25 mmol/L (ref 20–29)
Calcium: 9.2 mg/dL (ref 8.7–10.3)
Chloride: 101 mmol/L (ref 96–106)
Creatinine, Ser: 1.02 mg/dL — ABNORMAL HIGH (ref 0.57–1.00)
Glucose: 182 mg/dL — ABNORMAL HIGH (ref 70–99)
Potassium: 4.1 mmol/L (ref 3.5–5.2)
Sodium: 143 mmol/L (ref 134–144)
eGFR: 59 mL/min/{1.73_m2} — ABNORMAL LOW (ref >59)

## 2021-11-02 ENCOUNTER — Other Ambulatory Visit: Payer: Self-pay

## 2021-11-02 ENCOUNTER — Encounter: Payer: Self-pay | Admitting: Gastroenterology

## 2021-11-02 ENCOUNTER — Ambulatory Visit (INDEPENDENT_AMBULATORY_CARE_PROVIDER_SITE_OTHER): Payer: Medicare Other | Admitting: Gastroenterology

## 2021-11-02 ENCOUNTER — Encounter: Payer: Self-pay | Admitting: Internal Medicine

## 2021-11-02 ENCOUNTER — Ambulatory Visit (INDEPENDENT_AMBULATORY_CARE_PROVIDER_SITE_OTHER): Payer: Medicare Other | Admitting: Internal Medicine

## 2021-11-02 VITALS — BP 128/64 | HR 62 | Ht 63.0 in | Wt 183.0 lb

## 2021-11-02 VITALS — BP 132/74 | HR 60 | Ht 63.0 in | Wt 182.8 lb

## 2021-11-02 DIAGNOSIS — R140 Abdominal distension (gaseous): Secondary | ICD-10-CM

## 2021-11-02 DIAGNOSIS — I2510 Atherosclerotic heart disease of native coronary artery without angina pectoris: Secondary | ICD-10-CM | POA: Diagnosis not present

## 2021-11-02 DIAGNOSIS — I480 Paroxysmal atrial fibrillation: Secondary | ICD-10-CM

## 2021-11-02 DIAGNOSIS — E785 Hyperlipidemia, unspecified: Secondary | ICD-10-CM

## 2021-11-02 DIAGNOSIS — R194 Change in bowel habit: Secondary | ICD-10-CM | POA: Diagnosis not present

## 2021-11-02 DIAGNOSIS — E119 Type 2 diabetes mellitus without complications: Secondary | ICD-10-CM

## 2021-11-02 DIAGNOSIS — I5032 Chronic diastolic (congestive) heart failure: Secondary | ICD-10-CM

## 2021-11-02 MED ORDER — ATORVASTATIN CALCIUM 20 MG PO TABS: 20.0000 mg | ORAL_TABLET | Freq: Every day | ORAL | 3 refills | Status: DC

## 2021-11-02 MED ORDER — AMIODARONE HCL 200 MG PO TABS: 100.0000 mg | ORAL_TABLET | Freq: Every day | ORAL | 3 refills | Status: DC

## 2021-11-02 NOTE — Progress Notes (Signed)
? ? ? ?[facility] Progress Note ? ?Chief Complaint: Altered bowel habits ? ?Subjective  ?History: ?From the most recent office visit on 09/11/2021: ?"Her chronic diarrhea does not appear to be overflow from chronic constipation, despite CT scan report.  I think it is most likely IBS, possibly some medicine side effect (?  Metformin), possibly SIBO (suboptimally controlled diabetes with endorgan complications). ?  ?Her iron deficiency anemia had an apparent chronic blood loss component believed to be from a gastric polyp that is since been resected, exacerbated by chronic use of oral anticoagulation.  I also believe a significant component of it is from CKD.  Recommend return to primary care for management with IV iron or referral to hematology at their discretion. ?  ?Stop MiraLAX, Senokot and Protonix ?  ?Stop metformin for a week and see if any improvement in diarrhea.  (Requested she call or send a portal message with the outcome of that) ?  ?Empiric trial of possible SIBO with metronidazole 500 mg, 1 tablet twice daily for 10 days ?(Rifaximin typically prohibitively expensive with Medicare patients) ?  ?If no success with above, cautious trial of dicyclomine, though I am trying to minimize medicine use if possible and complex patient with polypharmacy. ?  ?Return to office about 6 weeks ?  ?Surveillance colonoscopy in 3 years." ?__________________________________ ? ? ?Alexis [name] was glad to report that the diarrhea has resolved and gas much improved since the recent visit (?  As a result of the empiric therapy with Flagyl). ?She still feels incompletely evacuated and may have days with several small pellet-like bowel movements, including yesterday.  She is taking 1 fiber capsule in the morning and 1 in the evening.  No longer on MiraLAX Senokot or other stimulant laxatives.  She has some bloating and gas that might be triggered by certain foods, so some written dietary advice was given. ?She also followed up  with her cardiologist this morning and says things are stable in that regard.  She is particularly glad that in recent months her medication regimen has been simplified. ? ?ROS: ?Cardiovascular:  no chest pain ?Respiratory: no dyspnea ? ?The patient's Past Medical, Family and Social History were reviewed and are on file in the EMR. ? ?Objective: ? ?Med list reviewed ? ?Current Outpatient Medications:  ?  acetaminophen (TYLENOL) 325 MG tablet, Take 2 tablets (650 mg total) by mouth every 6 (six) hours as needed for mild pain or moderate pain. (Patient taking differently: Take 500 mg by mouth every 6 (six) hours as needed for mild pain or moderate pain.), Disp: , Rfl:  ?  ALPRAZolam (XANAX) 0.5 MG tablet, Take 0.5 mg by mouth at bedtime., Disp: , Rfl:  ?  amiodarone (PACERONE) 200 MG tablet, Take 0.5 tablets (100 mg total) by mouth daily., Disp: 45 tablet, Rfl: 3 ?  apixaban (ELIQUIS) 5 MG TABS tablet, Take 1 tablet (5 mg total) by mouth 2 (two) times daily., Disp: 60 tablet, Rfl:  ?  atorvastatin (LIPITOR) 20 MG tablet, Take 1 tablet (20 mg total) by mouth daily., Disp: 90 tablet, Rfl: 3 ?  b complex vitamins tablet, Take 1 tablet by mouth at bedtime., Disp: , Rfl:  ?  BD PEN NEEDLE NANO 2ND GEN 32G X 4 MM MISC, See admin instructions., Disp: , Rfl:  ?  CALCIUM PO, Take 2 tablets by mouth at bedtime., Disp: , Rfl:  ?  FARXIGA 10 MG TABS tablet, Take 10 mg by mouth at bedtime., Disp: , Rfl:  ?  fish oil-omega-3 fatty acids 1000 MG capsule, Take 1 g by mouth at bedtime. , Disp: , Rfl:  ?  insulin lispro (HUMALOG) 100 UNIT/ML KwikPen, Inject 8 Units into the skin in the morning and at bedtime., Disp: , Rfl:  ?  metoprolol succinate (TOPROL-XL) 50 MG 24 hr tablet, Take 1 tablet (50 mg total) by mouth daily. Take with or immediately following a meal., Disp: , Rfl:  ?  pantoprazole (PROTONIX) 40 MG tablet, Take 1 tablet (40 mg total) by mouth 2 (two) times daily., Disp: 60 tablet, Rfl: 2 ?  pregabalin (LYRICA) 50 MG  capsule, Take 50 mg by mouth 2 (two) times daily., Disp: , Rfl:  ?  spironolactone (ALDACTONE) 25 MG tablet, Take 0.5 tablets (12.5 mg total) by mouth daily., Disp: 30 tablet, Rfl: 2 ?  TRESIBA FLEXTOUCH 100 UNIT/ML FlexTouch Pen, Inject 20 Units into the skin at bedtime., Disp: , Rfl:  ?  TRULICITY 1.5 HE/1.7EY SOPN, Inject 1.5 mg into the skin every Sunday., Disp: , Rfl:  ?  valsartan (DIOVAN) 160 MG tablet, Take 1 tablet (160 mg total) by mouth daily., Disp: 30 tablet, Rfl: 3 ?  venlafaxine XR (EFFEXOR-XR) 150 MG 24 hr capsule, Take 150 mg by mouth 2 (two) times daily., Disp: , Rfl:  ?  zaleplon (SONATA) 5 MG capsule, Take 5 mg by mouth at bedtime., Disp: , Rfl:  ? ? ?Vital signs in last 24 hrs: ?Vitals:  ? 11/02/21 1352  ?BP: 132/74  ?Pulse: 60  ?SpO2: 97%  ? ?Wt Readings from Last 3 Encounters:  ?11/02/21 182 lb 12.8 oz (82.9 kg)  ?11/02/21 183 lb (83 kg)  ?10/02/21 177 lb (80.3 kg)  ?  ?Physical Exam ? ?Looks well, no additional exam.  Entire visit spent in review of records symptoms and discussion of plan. ? ?Labs: ? ? ?___________________________________________ ?Radiologic studies: ? ? ?____________________________________________ ?Other: ? ? ?_____________________________________________ ?Assessment & Plan  ?Assessment: ?Encounter Diagnoses  ?Name Primary?  ? Altered bowel habits Yes  ? Abdominal bloating   ? ? ?I believe she has underlying chronic idiopathic constipation and then probably had superimposed bacterial overgrowth improved after an empiric course of metronidazole. ? ?She has some occasional brief crampy pain, I would like to avoid antispasmodic medicines so as not to contribute to polypharmacy.  Some written dietary advice regarding bloating and gas were given. ? ?She will change her current fiber regimen to 1 tablespoon of Citrucel in glass of water daily.  She finds that more successful and easy to use than the capsules. ?If she does not have a BM for 2 days, then she will take one of her  Dulcolax. ?Alexis [name] was given the option to set up a follow-up with me in about 2 months or to call as needed for further advice, and she opted for the latter. ? ? ?20 minutes were spent on this encounter (including chart review, history/exam, counseling/coordination of care, and documentation) > 50% of that time was spent on counseling and coordination of care.  ? ?Nelida Meuse III ? ?

## 2021-11-02 NOTE — Patient Instructions (Addendum)
_Please follow up with Dr Loletha Carrow as needed. ? ?______________________________________________________ ? ?Food Guidelines for those with chronic digestive trouble: ? ?Many people have difficulty digesting certain foods, causing a variety of distressing and embarrassing symptoms such as abdominal pain, bloating and gas.  These foods may need to be avoided or consumed in small amounts.  Here are some tips that might be helpful for you. ? ?1.   Lactose intolerance is the difficulty or complete inability to digest lactose, the natural sugar in milk and anything made from milk.  This condition is harmless, common, and can begin any time during life.  Some people can digest a modest amount of lactose while others cannot tolerate any.  Also, not all dairy products contain equal amounts of lactose.  For example, hard cheeses such as parmesan have less lactose than soft cheeses such as cheddar.  Yogurt has less lactose than milk or cheese.  Many packaged foods (even many brands of bread) have milk, so read ingredient lists carefully.  It is difficult to test for lactose intolerance, so just try avoiding lactose as much as possible for a week and see what happens with your symptoms.  If you seem to be lactose intolerant, the best plan is to avoid it (but make sure you get calcium from another source).  The next best thing is to use lactase enzyme supplements, available over the counter everywhere.  Just know that many lactose intolerant people need to take several tablets with each serving of dairy to avoid symptoms.  Lastly, a lot of restaurant food is made with milk or butter.  Many are things you might not suspect, such as mashed potatoes, rice and pasta (cooked with butter) and "grilled" items.  If you are lactose intolerant, it never hurts to ask your server what has milk or butter. ? ?2.   Fiber is an important part of your diet, but not all fiber is well-tolerated.  Insoluble fiber such as bran is often consumed by  normal gut bacteria and converted into gas.  Soluble fiber such as oats, squash, carrots and green beans are typically tolerated better. ? ?3.   Some types of carbohydrates can be poorly digested.  Examples include: fructose (apples, cherries, pears, raisins and other dried fruits), fructans (onions, zucchini, large amounts of wheat), sorbitol/mannitol/xylitol and sucralose/Splenda (common artificial sweeteners), and raffinose (lentils, broccoli, cabbage, asparagus, brussel sprouts, many types of beans).  ?Do a Development worker, community for FODMAP diet and you will find helpful information. ?Beano, a dietary supplement, will often help with raffinose-containing foods.  As with lactase tablets, you may need several per serving. ? ?4.   Whenever possible, avoid processed food&meats and chemical additives.  High fructose corn syrup, a common sweetener, may be difficult to digest.  Eggs and soy (comes from the soybean, and added to many foods now) are other common bloating/gassy foods. ? ?5.  Regarding gluten:  gluten is a protein mainly found in wheat, but also rye and barley.  There is a condition called celiac sprue, which is an inflammatory reaction in the small intestine causing a variety of digestive symptoms.  Blood testing is highly reliable to look for this condition, and sometimes upper endoscopy with small bowel biopsies may be necessary to make the diagnosis.  Many patients who test negative for celiac sprue report improvement in their digestive symptoms when they switch to a gluten-free diet.  However, in these "non-celiac gluten sensitive" patients, the true role of gluten in their symptoms is unclear.  Reducing carbohydrates in general may decrease the gas and bloating caused when gut bacteria consume carbs. Also, some of these patients may actually be intolerant of the baker's yeast in bread products rather than the gluten.  Flatbread and other reduced yeast breads might therefore be tolerated.  There is no specific  testing available for most food intolerances, which are discovered mainly by dietary elimination.  Please do not embark on a gluten free diet unless directed by your doctor, as it is highly restrictive, and may lead to nutritional deficiencies if not carefully monitored.  Lastly, beware of internet claims offering "personalized" tests for food intolerances.  Such testing has no reliable scientific evidence to support its reliability and correlation to symptoms.   ? ?6.  The best advice is old advice, especially for those with chronic digestive trouble - try to eat "clean".  Balanced diet, avoid processed food, plenty of fruits and vegetables, cut down the sugar, minimal alcohol, avoid tobacco. ?Make time to care for yourself, get enough sleep, exercise when you can, reduce stress.  Your guts will thank you for it. ? ? ?- Dr. Wilfrid Lund ?[facility] ? ?_________________________________________________________ ? ?If you are age 58 or older, your body mass index should be between 23-30. Your Body mass index is 32.38 kg/m?Marland Kitchen If this is out of the aforementioned range listed, please consider follow up with your Primary Care Provider. ? ?If you are age 16 or younger, your body mass index should be between 19-25. Your Body mass index is 32.38 kg/m?Marland Kitchen If this is out of the aformentioned range listed, please consider follow up with your Primary Care Provider.  ? ?________________________________________________________ ? ?The  GI providers would like to encourage you to use [facility] to communicate with providers for non-urgent requests or questions.  Due to long hold times on the telephone, sending your provider a message by [facility] may be a faster and more efficient way to get a response.  Please allow 48 business hours for a response.  Please remember that this is for non-urgent requests.  ?_______________________________________________________ ? ?Due to recent changes in healthcare laws, you may see the  results of your imaging and laboratory studies on MyChart before your provider has had a chance to review them.  We understand that in some cases there may be results that are confusing or concerning to you. Not all laboratory results come back in the same time frame and the provider may be waiting for multiple results in order to interpret others.  Please give [place] 48 hours in order for your provider to thoroughly review all the results before contacting the office for clarification of your results.  ? ?It was a pleasure to see you today! ? ?Thank you for trusting me with your gastrointestinal care!   ? ? ? ? ? ?

## 2021-11-02 NOTE — Patient Instructions (Signed)
Medication Instructions:  ?DECREASE amiodarone to '100mg'$  daily (half of '200mg'$  tablet) ? ?*If you need a refill on your cardiac medications before your next appointment, please call your pharmacy* ? ? ?Follow-Up: ?At [facility], you and your health needs are our priority.  As part of our continuing mission to provide you with exceptional heart care, we have created designated Provider Care Teams.  These Care Teams include your primary Cardiologist (physician) and Advanced Practice Providers (APPs -  Physician Assistants and Nurse Practitioners) who all work together to provide you with the care you need, when you need it. ? ?We recommend signing up for the patient portal called "MyChart".  Sign up information is provided on this After Visit Summary.  MyChart is used to connect with patients for Virtual Visits (Telemedicine).  Patients are able to view lab/test results, encounter notes, upcoming appointments, etc.  Non-urgent messages can be sent to your provider as well.   ?To learn more about what you can do with MyChart, go to [email or web address].   ? ?Your next appointment:   ?6 month(s) ? ?The format for your next appointment:   ?In Person ? ?Provider:   ?Pixie Casino, MD { ? ?
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
## 2021-12-25 ENCOUNTER — Telehealth: Payer: Self-pay | Admitting: Pharmacist

## 2021-12-25 NOTE — Telephone Encounter (Signed)
[facility] is currently conducting an anticoagulation clinical trial for atrial fibrillation. The trial is sponsored by [employer] and is called Atrial Fibrillation Stroke & Embolism Prevention Trial (OCEANIC-AF) The trial is comparing the new Factor XIa inhibitor (asundexian) to Eliquis and assessing outcomes with bleeding and stroke risk (similar to ARISTOTLE and ROCKET AF, but their direct comparator was warfarin).  We are doing the study with [facility] and Dr Adrian Prows, MD, [employer] is the principal investigator. Alexis [name] has expressed interest in participating in the trial. I advised her I would discuss it with you first. Please let me know your thoughts. Thanks. ?

## 2022-03-14 ENCOUNTER — Telehealth: Payer: Self-pay | Admitting: Internal Medicine

## 2022-03-14 DIAGNOSIS — I700 Atherosclerosis of aorta: Secondary | ICD-10-CM

## 2022-03-14 DIAGNOSIS — R937 Abnormal findings on diagnostic imaging of other parts of musculoskeletal system: Secondary | ICD-10-CM

## 2022-03-14 NOTE — Telephone Encounter (Signed)
[facility] would like for Dr. Debara Pickett or nurse fax in a letter or form stating that it is ok for them to send over X-Ray.

## 2022-03-14 NOTE — Telephone Encounter (Signed)
Patient went to [facility] She had xrays She was told she had a bulging aorta She said we need to call [facility] and request this report  Advised that [facility] fax to [place] her xray report Provided fax #  Also informed her that an xray is not a sensitive test for an aorta assessment -- would need echo or CT  She said her whole family is upset about this report she was given and she wants Dr. Debara Pickett to review  Advised he is out of the office this week, will review the report next week, whenever it is faxed/available for MD

## 2022-03-18 NOTE — Telephone Encounter (Signed)
Received disc with x-ray images. Called [facility] and explained that we may not be able to access due to security. They will send over office note but do not have a formal x-ray report form radiologist, etc

## 2022-03-21 NOTE — Telephone Encounter (Signed)
Contact by the patient regarding a lumbar xray obtained at [facility] - this was interpreted by the orthopedist to note abdominal aortic calcification with possible "buldging" vs overlying bowel. She was noted to have a CT abdomen in 08/2021 which demonstrated no aortic aneurysm, however, atherosclerosis was noted. Will update imaging with aortic ultrasound, however, suspect aneurysm is unlikely.  Pixie Casino, MD, [facility], [place] Director of the Advanced Lipid Disorders &  Cardiovascular Risk Reduction Clinic Diplomate of the American Board of Clinical Lipidology Attending Cardiologist  Direct Dial: [phone]  Fax: [phone]  Website:  [email or web address]

## 2022-03-21 NOTE — Telephone Encounter (Signed)
MD reviewed office note. Recommend u/s of aorta to r/o aneursym This has been ordered and staff message to scheduler LM for patient with this info.

## 2022-03-22 ENCOUNTER — Ambulatory Visit (HOSPITAL_COMMUNITY)
Admission: RE | Admit: 2022-03-22 | Discharge: 2022-03-22 | Disposition: A | Discharge status: Home / Self Care | Payer: Medicare Other | Source: Ambulatory Visit | Attending: Cardiovascular Disease | Admitting: Cardiovascular Disease

## 2022-03-22 ENCOUNTER — Other Ambulatory Visit (HOSPITAL_COMMUNITY): Payer: Medicare Other

## 2022-03-22 DIAGNOSIS — R937 Abnormal findings on diagnostic imaging of other parts of musculoskeletal system: Secondary | ICD-10-CM

## 2022-03-22 DIAGNOSIS — I700 Atherosclerosis of aorta: Secondary | ICD-10-CM | POA: Insufficient documentation

## 2022-04-03 NOTE — Progress Notes (Unsigned)
PATIENT: Alexis [name] DOB: [birth year]  REASON FOR VISIT: Follow up for peripheral neuropathy, cervical myelopathy, gait disorder HISTORY FROM: Patient, alone today PRIMARY NEUROLOGIST: Dr. Leta [name]   HISTORY OF PRESENT ILLNESS: Today 04/04/22 Alexis [name] is here today for follow-up.  MMSE 28/3, same as 6 months ago. In social situations she can't find the right word. Does her own ADLs, medications, drives, manages the household. Has tracker on her car. Her balance is poor. She can trip. 3 months ago had a fall. Seeing Dr. Lynann [name] tomorrow, her legs are weak when she walks. Lower back is hurting. Last A1C was in the 7's, seeing new endocrinologist. Tremor to both hands, is intermittent. If she notices, it's with fine motor skills, writing. On Lyrica for neuropathy. If she gets down on the ground, is hard for her to get up.   Update 10/02/21 SS: Alexis [name] here today for follow-up.  Recent hospitalizations for diastolic CHF, acute blood loss GI bleed. Fractured her left hip last may from a fall, broke bone in right foot after a fall in Dec 2022. Fall in the hospital fell forward using the bathroom, bruised ribs from husband trying to help her up. Uses a walker in the house, can't use a cane because of the tremor, tremor to both hands intermittently, related to amiodarone? Was going to start PT after hospital discharge but that was at time of foot injury, boot is off now, has follow up coming up to see if therapy can be continued. Feels like when she walks, she is forward leaning. 2 weeks ago fell off the toilet trying to zap a bug leaning forward. PCP is filling the Lyrica for her. With memory can't find the words at times. CT head Jan 2023 showed mild atrophy, chronic microvascular ischemic changes. Pays her bills on her phone, husband drives her, she does her own ADLs, she doesn't cook much on her own, the recipes get jumbled.   HISTORY  04/03/2021 Dr. Jannifer [name]: Alexis [name] is a  71 year old right-handed white female with a history of diabetes that has in the past been poorly controlled.  She has a underlying peripheral neuropathy, she takes Lyrica for some of the discomfort.  She has a history of cervical spine surgery with a prior cervical myelopathy and an underlying gait disorder.  On 04 December 2020, she fell at home when she tripped over a power cord.  She fractured her left hip, she required some therapy in an extended care facility and is now getting outpatient physical therapy.  She feels as if the hip flexion on the left is somewhat weak.  She has not had any further falls.  She is not using a cane or a walker for ambulation at this point.  She is doing some exercises with the legs.  She recently over the last couple months has noted some tremor in both arms, she has had some trouble with sloppy handwriting.  She was placed on amiodarone during the recent hospitalization as she went to the atrial fibrillation, she is now on Eliquis as well.  The patient reports some ongoing mild memory issues, she mainly has word finding problems but otherwise has not altered any of her activities of daily living because of memory.  She returns to this office for an evaluation.  REVIEW OF SYSTEMS: Out of a complete 14 system review of symptoms, the patient complains only of the following symptoms, and all other reviewed systems are negative.  See HPI  ALLERGIES:  Allergies  Allergen Reactions   Aspirin Other (See Comments)    Reaction: Vasculitis per MD   Codeine Itching and Rash    Tolerated Norco 12/07/20 with no reports of rash/itching. Patient endorsed allergy >30 yr ago and hasn't had reaction since.   Naproxen Nausea Only    Reaction: Makes stomach upset/irritated.   Sulfonamide Derivatives Itching and Rash    HOME MEDICATIONS: Outpatient Medications Prior to Visit  Medication Sig Dispense Refill   acetaminophen (TYLENOL) 325 MG tablet Take 2 tablets (650 mg total) by mouth  every 6 (six) hours as needed for mild pain or moderate pain. (Patient taking differently: Take 500 mg by mouth every 6 (six) hours as needed for mild pain or moderate pain.)     ALPRAZolam (XANAX) 0.5 MG tablet Take 0.5 mg by mouth at bedtime.     amiodarone (PACERONE) 200 MG tablet Take 0.5 tablets (100 mg total) by mouth daily. 45 tablet 3   apixaban (ELIQUIS) 5 MG TABS tablet Take 1 tablet (5 mg total) by mouth 2 (two) times daily. 60 tablet    atorvastatin (LIPITOR) 20 MG tablet Take 1 tablet (20 mg total) by mouth daily. 90 tablet 3   b complex vitamins tablet Take 1 tablet by mouth at bedtime.     BD PEN NEEDLE NANO 2ND GEN 32G X 4 MM MISC See admin instructions.     CALCIUM PO Take 2 tablets by mouth at bedtime.     FARXIGA 10 MG TABS tablet Take 10 mg by mouth at bedtime.     fish oil-omega-3 fatty acids 1000 MG capsule Take 1 g by mouth at bedtime.      insulin lispro (HUMALOG) 100 UNIT/ML KwikPen Inject 8 Units into the skin in the morning and at bedtime.     metoprolol succinate (TOPROL-XL) 50 MG 24 hr tablet Take 1 tablet (50 mg total) by mouth daily. Take with or immediately following a meal.     pantoprazole (PROTONIX) 40 MG tablet Take 1 tablet (40 mg total) by mouth 2 (two) times daily. 60 tablet 2   pregabalin (LYRICA) 50 MG capsule Take 50 mg by mouth 2 (two) times daily.     spironolactone (ALDACTONE) 25 MG tablet Take 0.5 tablets (12.5 mg total) by mouth daily. 30 tablet 2   TRESIBA FLEXTOUCH 100 UNIT/ML FlexTouch Pen Inject 20 Units into the skin at bedtime.     TRULICITY 1.5 DX/8.3JA SOPN Inject 1.5 mg into the skin every Sunday.     valsartan (DIOVAN) 160 MG tablet Take 1 tablet (160 mg total) by mouth daily. 30 tablet 3   venlafaxine XR (EFFEXOR-XR) 150 MG 24 hr capsule Take 150 mg by mouth 2 (two) times daily.     zaleplon (SONATA) 5 MG capsule Take 5 mg by mouth at bedtime.     No facility-administered medications prior to visit.    PAST MEDICAL HISTORY: Past  Medical History:  Diagnosis Date   Anxiety    Arthritis    "knees" (05/25/2014)   Chronic back pain    Depression    Diabetes mellitus type II    Diabetic peripheral neuropathy ([place]) 10/20/2018   Diverticulitis    Fatty liver    Fibromyalgia    Gastric polyp    hyperplastic   Gastroparesis    "recently dx'd" (05/25/2014)   GERD (gastroesophageal reflux disease)    H/O hiatal hernia    Hx of gastritis    Hyperlipidemia    Hypertension  Irritable bowel syndrome (IBS)    Memory difficulties 09/18/2017   Migraine without aura, without mention of intractable migraine without mention of status migrainosus    "related to allergies; have them in the spring and fall" (05/25/2014)   Obesity    Sleep apnea    "wore mask; took it off in my sleep; quit wearing it" (05/25/2014)   Uterine cancer ([place]) dx'd 2000   surg only   Vasculitis ([place])    "irritates my legs"   Wears glasses     PAST SURGICAL HISTORY: Past Surgical History:  Procedure Laterality Date   ANTERIOR CERVICAL DECOMPRESSION/DISCECTOMY FUSION 4 LEVELS N/A 05/07/2018   Procedure: ANTERIOR CERVICAL DECOMPRESSION FUSION, CERVICAL 4-5,CERVICAL 5-6, CERVICAL 6-7 WITH INSTRUMENTATION AND ALLOGRAFT;  Surgeon: Phylliss Bob, MD;  Location: [place];  Service: Orthopedics;  Laterality: N/A;   APPENDECTOMY  ~ 1967   BIOPSY  07/27/2021   Procedure: BIOPSY;  Surgeon: Yetta Flock, MD;  Location: [place];  Service: Gastroenterology;;   BREAST CYST EXCISION Right 1990   COLONOSCOPY     COLONOSCOPY WITH PROPOFOL N/A 07/27/2021   Procedure: COLONOSCOPY WITH PROPOFOL;  Surgeon: Yetta Flock, MD;  Location: [place];  Service: Gastroenterology;  Laterality: N/A;   ESOPHAGOGASTRODUODENOSCOPY (EGD) WITH PROPOFOL N/A 07/27/2021   Procedure: ESOPHAGOGASTRODUODENOSCOPY (EGD) WITH PROPOFOL;  Surgeon: Yetta Flock, MD;  Location: [place];  Service: Gastroenterology;  Laterality: N/A;   HEMOSTASIS CLIP  PLACEMENT  07/27/2021   Procedure: HEMOSTASIS CLIP PLACEMENT;  Surgeon: Yetta Flock, MD;  Location: [place] ENDOSCOPY;  Service: Gastroenterology;;   [place] WITH CORONARY ANGIOGRAM N/A 05/26/2014   Procedure: LEFT HEART CATHETERIZATION WITH CORONARY ANGIOGRAM;  Surgeon: Troy Sine, MD;  [facility] CATH LAB;  Service: Cardiovascular;  Laterality: N/A;   POLYPECTOMY  07/27/2021   Procedure: POLYPECTOMY;  Surgeon: Yetta Flock, MD;  Location: [place];  Service: Gastroenterology;;   Clide Deutscher  07/27/2021   Procedure: Clide Deutscher;  Surgeon: Yetta Flock, MD;  [facility] END[facility]COPY;  Service: Gastroenterology;;   SINUS SURGERY WITH INSTATRAK  2000   TOTAL HIP ARTHROPLASTY Left 12/05/2020   Procedure: TOTAL HIP ARTHROPLASTY ANTERIOR APPROACH;  Surgeon: Rod Can, MD;  Location: [place];  Service: Orthopedics;  Laterality: Left;   TUBAL LIGATION  ~ 1982   VAGINAL HYSTERECTOMY  2000    FAMILY HISTORY: Family History  Problem Relation Age of Onset   Colon polyps Father    Heart disease Father    Breast cancer Sister    COPD Brother    Irritable bowel syndrome Daughter    Ovarian cancer Maternal Aunt    Stomach cancer Maternal Aunt    Diabetes Maternal Aunt    Colon cancer Maternal Uncle    Heart disease Maternal Uncle    Heart disease Other        Grandparents   Esophageal cancer Neg Hx    Pancreatic cancer Neg Hx     SOCIAL HISTORY: Social History   Socioeconomic History   Marital status: Married    Spouse name: Shelbee Apgar   Number of children: 2   Years of education: Not on file   Highest education level: Some college, no degree  Occupational History   Occupation: Retired    Fish farm manager: RETIRED  Tobacco Use   Smoking status: Never   Smokeless tobacco: Never  Vaping Use   Vaping Use: Never used  Substance and Sexual Activity   Alcohol use: No  Drug use: No   Sexual  activity: Not Currently  Other Topics Concern   Not on file  Social History Narrative   Daily Caffeine, Coke   Social Determinants of Health   Financial Resource Strain: Low Risk  (04/27/2021)   Overall Financial Resource Strain (CARDIA)    Difficulty of Paying Living Expenses: Not very hard  Food Insecurity: No Food Insecurity (04/27/2021)   Hunger Vital Sign    Worried About Running Out of Food in the Last Year: Never true    Ran Out of Food in the Last Year: Never true  Transportation Needs: No Transportation Needs (04/27/2021)   PRAPARE - Hydrologist (Medical): No    Lack of Transportation (Non-Medical): No  Physical Activity: Not on file  Stress: Not on file  Social Connections: Not on file  Intimate Partner Violence: Not on file   PHYSICAL EXAM  Vitals:   04/04/22 1419  BP: (!) 170/71  Pulse: 74  Weight: 188 lb (85.3 kg)  Height: '5\' 4"'$  (1.626 m)    Body mass index is 32.27 kg/m.  Generalized: Well developed, in no acute distress     04/04/2022    2:20 PM 10/02/2021    3:50 PM 04/03/2021    4:05 PM  MMSE - Mini Mental State Exam  Orientation to time '5 5 4  '$ Orientation to Place '5 5 5  '$ Registration '3 3 3  '$ Attention/ Calculation '3 4 4  '$ Recall '3 3 2  '$ Language- name 2 objects '2 2 2  '$ Language- repeat '1 1 1  '$ Language- follow 3 step command '3 3 3  '$ Language- read & follow direction '1 1 1  '$ Write a sentence '1 1 1  '$ Copy design 1 0 1  Total score '28 28 27   '$ Neurological examination  Mentation: Alert oriented to time, place, history taking. Follows all commands speech and language fluent Cranial nerve II-XII: Pupils were equal round reactive to light. Extraocular movements were full, visual field were full on confrontational test. Facial sensation and strength were normal.  Head turning and shoulder shrug were normal and symmetric. Motor: Good strength all extremities, 4/5 bilateral hip flexion, I see no postural tremor to hands Sensory:  Length dependent sensory deficit to soft touch to bilateral lower extremities Coordination: Cerebellar testing reveals good finger-nose-finger and heel-to-shin bilaterally.  With handwriting sample, there was no tremor translated to handwriting, spiral drawl was mildly curvy Gait and station: Gait is slightly wide-based, but steady and independent Reflexes: Deep tendon reflexes are symmetric and normal bilaterally.   DIAGNOSTIC DATA (LABS, IMAGING, TESTING) - I reviewed patient records, labs, notes, testing and imaging myself where available.  Lab Results  Component Value Date   WBC 8.8 08/18/2021   HGB 11.9 (L) 08/18/2021   HCT 38.2 08/18/2021   MCV 83.8 08/18/2021   PLT 169 08/18/2021      Component Value Date/Time   NA 143 10/30/2021 1228   K 4.1 10/30/2021 1228   CL 101 10/30/2021 1228   CO2 25 10/30/2021 1228   GLUCOSE 182 (H) 10/30/2021 1228   GLUCOSE 111 (H) 08/20/2021 0541   BUN 20 10/30/2021 1228   CREATININE 1.02 (H) 10/30/2021 1228   CALCIUM 9.2 10/30/2021 1228   PROT 6.8 08/18/2021 1050   PROT 7.2 01/29/2021 1252   ALBUMIN 3.2 (L) 08/18/2021 1050   ALBUMIN 4.0 01/29/2021 1252   AST 47 (H) 08/18/2021 1050   ALT 57 (H) 08/18/2021 1050   ALKPHOS  164 (H) 08/18/2021 1050   BILITOT 0.3 08/18/2021 1050   BILITOT 0.4 01/29/2021 1252   GFRNONAA >60 08/20/2021 0541   GFRAA >60 05/07/2018 0642   Lab Results  Component Value Date   CHOL 104 07/25/2021   HDL 26 (L) 07/25/2021   LDLCALC 61 07/25/2021   TRIG 87 07/25/2021   CHOLHDL 4.0 07/25/2021   Lab Results  Component Value Date   HGBA1C 8.8 (H) 08/18/2021   Lab Results  Component Value Date   VITAMINB12 1,011 (H) 07/25/2021   Lab Results  Component Value Date   TSH 1.900 07/25/2021   ASSESSMENT AND PLAN 71 y.o. year old female   1.  Diabetic peripheral neuropathy 2.  Cervical myelopathy 3.  Gait disorder 4.  Mild cognitive impairment  -MMSE stable 28/30, no major changes -Seeing orthopedics  tomorrow about her low back -Monitor conditions conservatively, continue to see PCP, return here for new or worsening symptoms , we are not prescribing any medications    Evangeline Dakin, DNP 04/04/2022, 2:33 PM [facility] [address], [place], [place] [address] [phone]

## 2022-04-04 ENCOUNTER — Ambulatory Visit (INDEPENDENT_AMBULATORY_CARE_PROVIDER_SITE_OTHER): Payer: Medicare Other | Admitting: Neurology

## 2022-04-04 ENCOUNTER — Encounter: Payer: Self-pay | Admitting: Neurology

## 2022-04-04 VITALS — BP 170/71 | HR 74 | Ht 64.0 in | Wt 188.0 lb

## 2022-04-04 DIAGNOSIS — E1142 Type 2 diabetes mellitus with diabetic polyneuropathy: Secondary | ICD-10-CM | POA: Diagnosis not present

## 2022-04-04 DIAGNOSIS — R413 Other amnesia: Secondary | ICD-10-CM

## 2022-04-04 DIAGNOSIS — G959 Disease of spinal cord, unspecified: Secondary | ICD-10-CM

## 2022-04-04 NOTE — Patient Instructions (Signed)
Continue to monitor your symptoms  Return here as needed

## 2022-05-08 ENCOUNTER — Ambulatory Visit: Payer: Medicare Other | Admitting: Internal Medicine

## 2022-05-09 NOTE — Progress Notes (Signed)
Cardiology Clinic Note   Patient Name: Alexis [name] Date of Encounter: 05/10/2022  Primary Care Provider:  Deland Pretty, MD Primary Cardiologist:  Pixie Casino, MD  Patient Profile    [name] is a 71 year old female patient we are following for ongoing assessment and management of hypertension, with history of OSA on CPAP, previous history of PVCs and PACs and short runs of SVT on a cardiac monitor in 2018.    Coronary CT angiogram revealing mild two-vessel coronary artery disease with plaque in LAD and RCA and coronary artery calcium score of 90.  She also had a repeat home sleep study with an 8 AHI of 24/h and continues on CPAP.  She is inconsistently compliant.    Other history includes hyperlipidemia followed by Dr. Debara Pickett.  She is recommended to have a LDL less than 70 with known coronary artery disease.  Also she has a history of type 2 diabetes followed by primary care, NAFLD.  Past Medical History    Past Medical History:  Diagnosis Date   Anxiety    Arthritis    "knees" (05/25/2014)   Chronic back pain    Depression    Diabetes mellitus type II    Diabetic peripheral neuropathy ([place]) 10/20/2018   Diverticulitis    Fatty liver    Fibromyalgia    Gastric polyp    hyperplastic   Gastroparesis    "recently dx'd" (05/25/2014)   GERD (gastroesophageal reflux disease)    H/O hiatal hernia    Hx of gastritis    Hyperlipidemia    Hypertension    Irritable bowel syndrome (IBS)    Memory difficulties 09/18/2017   Migraine without aura, without mention of intractable migraine without mention of status migrainosus    "related to allergies; have them in the spring and fall" (05/25/2014)   Obesity    Sleep apnea    "wore mask; took it off in my sleep; quit wearing it" (05/25/2014)   Uterine cancer ([place]) dx'd 2000   surg only   Vasculitis ([place])    "irritates my legs"   Wears glasses    Past Surgical History:  Procedure Laterality Date   ANTERIOR CERVICAL  DECOMPRESSION/DISCECTOMY FUSION 4 LEVELS N/A 05/07/2018   Procedure: ANTERIOR CERVICAL DECOMPRESSION FUSION, CERVICAL 4-5,CERVICAL 5-6, CERVICAL 6-7 WITH INSTRUMENTATION AND ALLOGRAFT;  Surgeon: Phylliss Bob, MD;  Location: [place];  Service: Orthopedics;  Laterality: N/A;   APPENDECTOMY  ~ 1967   BIOPSY  07/27/2021   Procedure: BIOPSY;  Surgeon: Yetta Flock, MD;  Location: [place];  Service: Gastroenterology;;   BREAST CYST EXCISION Right 1990   COLONOSCOPY     COLONOSCOPY WITH PROPOFOL N/A 07/27/2021   Procedure: COLONOSCOPY WITH PROPOFOL;  Surgeon: Yetta Flock, MD;  Location: [facility];  Service: Gastroenterology;  Laterality: N/A;   ESOPHAGOGASTRODUODENOSCOPY (EGD) WITH PROPOFOL N/A 07/27/2021   Procedure: ESOPHAGOGASTRODUODENOSCOPY (EGD) WITH PROPOFOL;  Surgeon: Yetta Flock, MD;  Location: [place];  Service: Gastroenterology;  Laterality: N/A;   HEMOSTASIS CLIP PLACEMENT  07/27/2021   Procedure: HEMOSTASIS CLIP PLACEMENT;  Surgeon: Yetta Flock, MD;  Location: [place] END[facility]COPY;  Service: Gastroenterology;;   [place] WITH CORONARY ANGIOGRAM N/A 05/26/2014   Procedure: LEFT HEART CATHETERIZATION WITH CORONARY ANGIOGRAM;  Surgeon: Troy Sine, MD;  Location: [facility] CATH LAB;  Service: Cardiovascular;  Laterality: N/A;   POLYPECTOMY  07/27/2021   Procedure: POLYPECTOMY;  Surgeon: Yetta Flock, MD;  Location: [place] ENDOSCOPY;  Service: Gastroenterology;;   Clide Deutscher  07/27/2021   Procedure: Clide Deutscher;  Surgeon: Yetta Flock, MD;  Location: [facility] END[facility]COPY;  Service: Gastroenterology;;   SINUS SURGERY WITH INSTATRAK  2000   TOTAL HIP ARTHROPLASTY Left 12/05/2020   Procedure: TOTAL HIP ARTHROPLASTY ANTERIOR APPROACH;  Surgeon: Rod Can, MD;  Location: [place];  Service: Orthopedics;  Laterality: Left;   TUBAL LIGATION  ~ 1982   VAGINAL HYSTERECTOMY  2000     Allergies  Allergies  Allergen Reactions   Aspirin Other (See Comments)    Reaction: Vasculitis per MD   Codeine Itching and Rash    Tolerated Norco 12/07/20 with no reports of rash/itching. Patient endorsed allergy >30 yr ago and hasn't had reaction since.   Naproxen Nausea Only    Reaction: Makes stomach upset/irritated.   Sulfonamide Derivatives Itching and Rash    History of Present Illness    Mrs. [name] comes today with multiple somatic complaints.  She states over the last 2 weeks she has had worsening shortness of breath with minimal exertion, decreased energy, and weight gain.  She is being followed by PCP for NAFLD and states that they have informed her that her liver enzymes are rising.  They took her off of atorvastatin.  She also complains of chronic back pain, frequent headaches and multiple GI complaints.  She states that she has not been using her CPAP for years.  Her husband states that she snores very loudly.  She is exhausted during the day.  She also takes multiple doses of acetaminophen for chronic pain.  Home Medications    Current Outpatient Medications  Medication Sig Dispense Refill   acetaminophen (TYLENOL) 325 MG tablet Take 2 tablets (650 mg total) by mouth every 6 (six) hours as needed for mild pain or moderate pain. (Patient taking differently: Take 500 mg by mouth every 6 (six) hours as needed for mild pain or moderate pain.)     ALPRAZolam (XANAX) 0.5 MG tablet Take 0.5 mg by mouth at bedtime.     amiodarone (PACERONE) 200 MG tablet Take 0.5 tablets (100 mg total) by mouth daily. 45 tablet 3   apixaban (ELIQUIS) 5 MG TABS tablet Take 1 tablet (5 mg total) by mouth 2 (two) times daily. 60 tablet    b complex vitamins tablet Take 1 tablet by mouth at bedtime.     BD PEN NEEDLE NANO 2ND GEN 32G X 4 MM MISC See admin instructions.     CALCIUM PO Take 2 tablets by mouth at bedtime.     FARXIGA 10 MG TABS tablet Take 10 mg by mouth at bedtime.     fish  oil-omega-3 fatty acids 1000 MG capsule Take 1 g by mouth at bedtime.      insulin lispro (HUMALOG) 100 UNIT/ML KwikPen Inject 8 Units into the skin in the morning and at bedtime.     metoprolol succinate (TOPROL-XL) 50 MG 24 hr tablet Take 1 tablet (50 mg total) by mouth daily. Take with or immediately following a meal.     pantoprazole (PROTONIX) 40 MG tablet Take 1 tablet (40 mg total) by mouth 2 (two) times daily. 60 tablet 2   pregabalin (LYRICA) 50 MG capsule Take 50 mg by mouth 2 (two) times daily.     spironolactone (ALDACTONE) 25 MG tablet Take 0.5 tablets (12.5 mg total) by mouth daily. 30 tablet 2   TRESIBA FLEXTOUCH 100 UNIT/ML FlexTouch Pen Inject 20 Units into the skin at bedtime.  TRULICITY 1.5 PN/3.6RW SOPN Inject 1.5 mg into the skin every Sunday.     valsartan (DIOVAN) 160 MG tablet Take 1 tablet (160 mg total) by mouth daily. 30 tablet 3   venlafaxine XR (EFFEXOR-XR) 150 MG 24 hr capsule Take 150 mg by mouth 2 (two) times daily.     zaleplon (SONATA) 5 MG capsule Take 5 mg by mouth at bedtime.     No current facility-administered medications for this visit.     Family History    Family History  Problem Relation Age of Onset   Colon polyps Father    Heart disease Father    Breast cancer Sister    COPD Brother    Irritable bowel syndrome Daughter    Ovarian cancer Maternal Aunt    Stomach cancer Maternal Aunt    Diabetes Maternal Aunt    Colon cancer Maternal Uncle    Heart disease Maternal Uncle    Heart disease Other        Grandparents   Esophageal cancer Neg Hx    Pancreatic cancer Neg Hx    She indicated that her mother is alive. She indicated that her father is alive. She indicated that her sister is alive. She indicated that the status of her brother is unknown. She indicated that her maternal grandmother is deceased. She indicated that her maternal grandfather is deceased. She indicated that her paternal grandmother is deceased. She indicated that her  paternal grandfather is deceased. She indicated that two of her three daughters are alive. She indicated that the status of her neg hx is unknown. She indicated that the status of her other is unknown.  Social History    Social History   Socioeconomic History   Marital status: Married    Spouse name: Misha Vanoverbeke   Number of children: 2   Years of education: Not on file   Highest education level: Some college, no degree  Occupational History   Occupation: Retired    Fish farm manager: RETIRED  Tobacco Use   Smoking status: Never   Smokeless tobacco: Never  Vaping Use   Vaping Use: Never used  Substance and Sexual Activity   Alcohol use: No   Drug use: No   Sexual activity: Not Currently  Other Topics Concern   Not on file  Social History Narrative   Daily Caffeine, Coke   Social Determinants of Health   Financial Resource Strain: Low Risk  (04/27/2021)   Overall Financial Resource Strain (CARDIA)    Difficulty of Paying Living Expenses: Not very hard  Food Insecurity: No Food Insecurity (04/27/2021)   Hunger Vital Sign    Worried About Running Out of Food in the Last Year: Never true    Ran Out of Food in the Last Year: Never true  Transportation Needs: No Transportation Needs (04/27/2021)   PRAPARE - Hydrologist (Medical): No    Lack of Transportation (Non-Medical): No  Physical Activity: Not on file  Stress: Not on file  Social Connections: Not on file  Intimate Partner Violence: Not on file     Review of Systems    General:  No chills, fever, night sweats, positive for weight gain Cardiovascular:  No chest pain, worsening dyspnea on exertion, edema, orthopnea, palpitations, paroxysmal nocturnal dyspnea. Dermatological: No rash, lesions/masses Respiratory: No cough, dyspnea Urologic: No hematuria, dysuria Abdominal:   No nausea, vomiting, positive for constipation and diarrhea, bright red blood per rectum, melena, or hematemesis Neurologic:   No  visual changes, wkns, changes in mental status. All other systems reviewed and are otherwise negative except as noted above.     Physical Exam    VS:  BP 138/84   Pulse 60   Ht '5\' 4"'$  (1.626 m)   Wt 190 lb 3.2 oz (86.3 kg)   SpO2 92%   BMI 32.65 kg/m  , BMI Body mass index is 32.65 kg/m.     GEN: Well nourished, well developed, in no acute distress. HEENT: normal. Neck: Supple, no JVD, carotid bruits, or masses. Cardiac: RRR, 2/6 systolic murmurs, no rubs, or gallops. No clubbing, cyanosis, edema.  Radials/DP/PT 2+ and equal bilaterally.  Respiratory:  Respirations regular and unlabored, clear to auscultation bilaterally. GI: Soft, nontender, nondistended, BS + x 4. MS: no deformity or atrophy. Skin: warm and dry, no rash. Neuro:  Strength and sensation are intact. Psych: Normal affect.  Accessory Clinical Findings      Lab Results  Component Value Date   WBC 8.8 08/18/2021   HGB 11.9 (L) 08/18/2021   HCT 38.2 08/18/2021   MCV 83.8 08/18/2021   PLT 169 08/18/2021   Lab Results  Component Value Date   CREATININE 1.02 (H) 10/30/2021   BUN 20 10/30/2021   NA 143 10/30/2021   K 4.1 10/30/2021   CL 101 10/30/2021   CO2 25 10/30/2021   Lab Results  Component Value Date   ALT 57 (H) 08/18/2021   AST 47 (H) 08/18/2021   ALKPHOS 164 (H) 08/18/2021   BILITOT 0.3 08/18/2021   Lab Results  Component Value Date   CHOL 104 07/25/2021   HDL 26 (L) 07/25/2021   LDLCALC 61 07/25/2021   TRIG 87 07/25/2021   CHOLHDL 4.0 07/25/2021    Lab Results  Component Value Date   HGBA1C 8.8 (H) 08/18/2021    Review of Prior Studies: Echocardiogram 07/25/2021  1. Left ventricular ejection fraction, by estimation, is 65 to 70%. The  left ventricle has normal function. The left ventricle has no regional  wall motion abnormalities. Left ventricular diastolic parameters are  consistent with Grade II diastolic  dysfunction (pseudonormalization).   2. Right ventricular  systolic function is normal. The right ventricular  size is normal.   3. The mitral valve is degenerative. No evidence of mitral valve  regurgitation. No evidence of mitral stenosis. Moderate mitral annular  calcification.   4. The aortic valve is tricuspid. Aortic valve regurgitation is not  visualized. Aortic valve sclerosis is present, with no evidence of aortic  valve stenosis.   5. The inferior vena cava is normal in size with <50% respiratory  variability, suggesting right atrial pressure of 8 mmHg.   VAS [place] AAA Duplex 03/22/2022 Abdominal Aorta: No evidence of an abdominal aortic aneurysm was  visualized. The largest aortic measurement is 2.3 cm.  Stenosis:  Patent bilateral common and external iliac arteries without evidence of  stenosis.   Coronary CTA 11/21/2020 1.  Diffuse nonobstructive CAD, CADRADS = 2.   2. Coronary calcium score of 469. This was 93rd percentile for age and sex matched control.   3. Normal coronary origin with right dominance.   4.  Aortic atherosclerosis   5.  Small PFO without evidence of shunting.   6.  Mitral annular calcification.  Assessment & Plan   1.  Dyspnea on exertion: Uncertain etiology.  She does have degenerative mitral valve per echocardiogram in December 2022.  Can consider repeating echocardiogram if symptoms are persistent once home sleep study is  completed and she is on appropriate therapy.  2.  OSA: This may also be playing into her dyspnea, and fatigue.  She is in agreement to have a home sleep study completed so that she may qualify for a new CPAP machine.  She states that she will need to have something that fits her better as she often takes it off in the middle of the night.  Will defer to sleep medicine to have her fitted for a better mask or other device which will work better for her.  For now we will identify level of AHI.  3.  Hypercholesterolemia: Has been taken off of statin therapy due to rising liver enzymes.  I am  going to request these records so that I can follow the trend.  May need to consider Repatha.  Once I have an opportunity to review the labs will probably refer her to lipid clinic with Dr. Debara Pickett.  4.  NAFLD: So far followed by primary care.  She reports that her liver enzymes are rising.  She was taken off of atorvastatin, but this is not an indication to discontinue unless liver enzymes have tripled.  She is taking a lot of Tylenol.  I advised her to cut back significantly and to talk to her PCP about pain control meds.  Also she is on amiodarone 100 mg daily.  May need to consider stopping this but I want to review the labs before doing so.  5.  PAF: Remains on Eliquis 5 mg twice daily and amiodarone for heart rate control.  May need to consider medication adjustment.  She remains on metoprolol 50 mg daily.  Can consider increasing this dose in order to take her off of the amiodarone if this is necessary.  6.  Type 2 diabetes: Followed by PCP, contributing to NAFLD.    Current medicines are reviewed at length with the patient today.  I have spent 45 min's  dedicated to the care of this patient on the date of this encounter to include pre-visit review of records, assessment, management and diagnostic testing,with shared decision making.  Signed, Phill Myron. West Pugh, ANP, AACC   05/10/2022 5:02 PM      Office [phone] Fax [phone]  Notice: This dictation was prepared with Dragon dictation along with smaller phrase technology. Any transcriptional errors that result from this process are unintentional and may not be corrected upon review.

## 2022-05-10 ENCOUNTER — Encounter: Payer: Self-pay | Admitting: Adult Health

## 2022-05-10 ENCOUNTER — Ambulatory Visit: Payer: POS | Attending: Internal Medicine | Admitting: Adult Health

## 2022-05-10 VITALS — BP 138/84 | HR 60 | Ht 64.0 in | Wt 190.2 lb

## 2022-05-10 DIAGNOSIS — I10 Essential (primary) hypertension: Secondary | ICD-10-CM

## 2022-05-10 DIAGNOSIS — I480 Paroxysmal atrial fibrillation: Secondary | ICD-10-CM

## 2022-05-10 DIAGNOSIS — I2510 Atherosclerotic heart disease of native coronary artery without angina pectoris: Secondary | ICD-10-CM

## 2022-05-10 DIAGNOSIS — Z8669 Personal history of other diseases of the nervous system and sense organs: Secondary | ICD-10-CM

## 2022-05-10 DIAGNOSIS — Z794 Long term (current) use of insulin: Secondary | ICD-10-CM

## 2022-05-10 DIAGNOSIS — E1169 Type 2 diabetes mellitus with other specified complication: Secondary | ICD-10-CM

## 2022-05-10 DIAGNOSIS — K760 Fatty (change of) liver, not elsewhere classified: Secondary | ICD-10-CM

## 2022-05-10 DIAGNOSIS — Z79899 Other long term (current) drug therapy: Secondary | ICD-10-CM

## 2022-05-10 DIAGNOSIS — G4733 Obstructive sleep apnea (adult) (pediatric): Secondary | ICD-10-CM

## 2022-05-10 DIAGNOSIS — E7800 Pure hypercholesterolemia, unspecified: Secondary | ICD-10-CM | POA: Diagnosis not present

## 2022-05-10 NOTE — Patient Instructions (Signed)
Medication Instructions:  Your physician recommends that you continue on your current medications as directed. Please refer to the Current Medication list given to you today.  *If you need a refill on your cardiac medications before your next appointment, please call your pharmacy*   Lab Work: NONE If you have labs (blood work) drawn today and your tests are completely normal, you will receive your results only by: [place] (if you have MyChart) OR A paper copy in the mail If you have any lab test that is abnormal or we need to change your treatment, we will call you to review the results.   Testing/Procedures: NONE   Follow-Up: At [facility], you and your health needs are our priority.  As part of our continuing mission to provide you with exceptional heart care, we have created designated Provider Care Teams.  These Care Teams include your primary Cardiologist (physician) and Advanced Practice Providers (APPs -  Physician Assistants and Nurse Practitioners) who all work together to provide you with the care you need, when you need it.  We recommend signing up for the patient portal called "MyChart".  Sign up information is provided on this After Visit Summary.  MyChart is used to connect with patients for Virtual Visits (Telemedicine).  Patients are able to view lab/test results, encounter notes, upcoming appointments, etc.  Non-urgent messages can be sent to your provider as well.   To learn more about what you can do with MyChart, go to [email or web address].    Your next appointment:   3 month(s)  The format for your next appointment:   In Person  Provider:   Pixie Casino, MD

## 2022-05-23 ENCOUNTER — Telehealth: Payer: Self-pay | Admitting: Gastroenterology

## 2022-05-23 NOTE — Telephone Encounter (Signed)
Fax rec'd from PCP Thedora Hinders at [facility]) with labs showing rising LFTs over the last year.  This is a patient of mine, but not previously seen for elevated LFTs.  Please arrange a next-available appointment with me or one of the APPs.  There are no imaging studies included with the PCP records.   If this patient says she has not had any recent US or CT scan of the abdomen, please order a full abdominal ultrasound with doppler studies under my name.  - HD

## 2022-05-28 NOTE — Telephone Encounter (Signed)
Left message to return call 

## 2022-05-28 NOTE — Telephone Encounter (Signed)
Patient has scheduled a follow up with PA Lemmon on 06-27-2022

## 2022-06-04 ENCOUNTER — Other Ambulatory Visit: Payer: Self-pay

## 2022-06-04 ENCOUNTER — Emergency Department (HOSPITAL_COMMUNITY)
Admission: EM | Admit: 2022-06-04 | Discharge: 2022-06-05 | Disposition: A | Discharge status: Home / Self Care | Payer: Medicare Other | Attending: Emergency Medicine | Admitting: Emergency Medicine

## 2022-06-04 ENCOUNTER — Emergency Department (HOSPITAL_COMMUNITY): Payer: Medicare Other

## 2022-06-04 ENCOUNTER — Encounter (HOSPITAL_COMMUNITY): Payer: Self-pay

## 2022-06-04 DIAGNOSIS — R0601 Orthopnea: Secondary | ICD-10-CM | POA: Insufficient documentation

## 2022-06-04 DIAGNOSIS — R2243 Localized swelling, mass and lump, lower limb, bilateral: Secondary | ICD-10-CM | POA: Diagnosis not present

## 2022-06-04 DIAGNOSIS — I509 Heart failure, unspecified: Secondary | ICD-10-CM | POA: Insufficient documentation

## 2022-06-04 DIAGNOSIS — I480 Paroxysmal atrial fibrillation: Secondary | ICD-10-CM | POA: Diagnosis not present

## 2022-06-04 DIAGNOSIS — E119 Type 2 diabetes mellitus without complications: Secondary | ICD-10-CM | POA: Insufficient documentation

## 2022-06-04 DIAGNOSIS — Z7901 Long term (current) use of anticoagulants: Secondary | ICD-10-CM | POA: Insufficient documentation

## 2022-06-04 DIAGNOSIS — R5383 Other fatigue: Secondary | ICD-10-CM | POA: Diagnosis not present

## 2022-06-04 DIAGNOSIS — R079 Chest pain, unspecified: Secondary | ICD-10-CM | POA: Diagnosis present

## 2022-06-04 DIAGNOSIS — Z794 Long term (current) use of insulin: Secondary | ICD-10-CM | POA: Insufficient documentation

## 2022-06-04 DIAGNOSIS — I110 Hypertensive heart disease with heart failure: Secondary | ICD-10-CM | POA: Insufficient documentation

## 2022-06-04 DIAGNOSIS — R0602 Shortness of breath: Secondary | ICD-10-CM | POA: Insufficient documentation

## 2022-06-04 DIAGNOSIS — Z79899 Other long term (current) drug therapy: Secondary | ICD-10-CM | POA: Insufficient documentation

## 2022-06-04 LAB — CBC
HCT: 36.5 % (ref 36.0–46.0)
Hemoglobin: 11.8 g/dL — ABNORMAL LOW (ref 12.0–15.0)
MCH: 28.9 pg (ref 26.0–34.0)
MCHC: 32.3 g/dL (ref 30.0–36.0)
MCV: 89.5 fL (ref 80.0–100.0)
Platelets: 211 10*3/uL (ref 150–400)
RBC: 4.08 MIL/uL (ref 3.87–5.11)
RDW: 16.2 % — ABNORMAL HIGH (ref 11.5–15.5)
WBC: 7.1 10*3/uL (ref 4.0–10.5)
nRBC: 0 % (ref 0.0–0.2)

## 2022-06-04 LAB — BASIC METABOLIC PANEL
Anion gap: 7 (ref 5–15)
BUN: 16 mg/dL (ref 8–23)
CO2: 27 mmol/L (ref 22–32)
Calcium: 8.7 mg/dL — ABNORMAL LOW (ref 8.9–10.3)
Chloride: 107 mmol/L (ref 98–111)
Creatinine, Ser: 0.88 mg/dL (ref 0.44–1.00)
GFR, Estimated: >60 mL/min (ref >60)
Glucose, Bld: 200 mg/dL — ABNORMAL HIGH (ref 70–99)
Potassium: 4 mmol/L (ref 3.5–5.1)
Sodium: 141 mmol/L (ref 135–145)

## 2022-06-04 LAB — TROPONIN I (HIGH SENSITIVITY): Troponin I (High Sensitivity): 17 ng/L (ref <18)

## 2022-06-04 LAB — BRAIN NATRIURETIC PEPTIDE: B Natriuretic Peptide: 1203.1 pg/mL — ABNORMAL HIGH (ref 0.0–100.0)

## 2022-06-04 NOTE — ED Provider Triage Note (Signed)
Emergency Medicine Provider Triage Evaluation Note  Alexis [name] , a 71 y.o. female  was evaluated in triage.  Pt complains of chest pain associated with shortness of breath that started over the weekend.  Shortness of breath worse when lying flat.  No history of CHF.  She admits to lower extremity edema.  Review of Systems  Positive: CP, SOB Negative: fever  Physical Exam  BP (!) 172/73 (BP Location: Right Arm)   Pulse (!) 55   Temp 98.3 F (36.8 C) (Oral)   Resp 18   Ht '5\' 4"'$  (1.626 m)   Wt 89.4 kg   SpO2 94%   BMI 33.81 kg/m  Gen:   Awake, no distress   Resp:  Normal effort  MSK:   Moves extremities without difficulty  Other:  2+ pitting edema bilaterally  Medical Decision Making  Medically screening exam initiated at 4:10 PM.  Appropriate orders placed.  [name] was informed that the remainder of the evaluation will be completed by another provider, this initial triage assessment does not replace that evaluation, and the importance of remaining in the ED until their evaluation is complete.  Cardiac labs BNP CXR EKG   Suzy Bouchard, PA-C 06/04/22 1611

## 2022-06-04 NOTE — ED Triage Notes (Signed)
Pt reports chest "feels full" onset over the weekend associated with trouble sleeping when laying down. Denies hx of CHF. She also reports swelling to her legs and she feels very weak.

## 2022-06-05 ENCOUNTER — Encounter (HOSPITAL_COMMUNITY): Payer: Self-pay

## 2022-06-05 DIAGNOSIS — I509 Heart failure, unspecified: Secondary | ICD-10-CM | POA: Diagnosis not present

## 2022-06-05 LAB — HEPATIC FUNCTION PANEL
ALT: 52 U/L — ABNORMAL HIGH (ref 0–44)
AST: 64 U/L — ABNORMAL HIGH (ref 15–41)
Albumin: 2.9 g/dL — ABNORMAL LOW (ref 3.5–5.0)
Alkaline Phosphatase: 121 U/L (ref 38–126)
Bilirubin, Direct: 0.1 mg/dL (ref 0.0–0.2)
Indirect Bilirubin: 0.7 mg/dL (ref 0.3–0.9)
Total Bilirubin: 0.8 mg/dL (ref 0.3–1.2)
Total Protein: 6.9 g/dL (ref 6.5–8.1)

## 2022-06-05 LAB — TSH: TSH: 3.668 u[IU]/mL (ref 0.350–4.500)

## 2022-06-05 LAB — TROPONIN I (HIGH SENSITIVITY): Troponin I (High Sensitivity): 16 ng/L (ref <18)

## 2022-06-05 LAB — MAGNESIUM: Magnesium: 2 mg/dL (ref 1.7–2.4)

## 2022-06-05 MED ORDER — METOPROLOL SUCCINATE ER 25 MG PO TB24
50.0000 mg | ORAL_TABLET | Freq: Every day | ORAL | Status: DC
  Administered 2022-06-05: 50 mg via ORAL
  Filled 2022-06-05: qty 2

## 2022-06-05 MED ORDER — FUROSEMIDE 20 MG PO TABS: 20.0000 mg | ORAL_TABLET | Freq: Two times a day (BID) | ORAL | 0 refills | Status: DC

## 2022-06-05 MED ORDER — NITROGLYCERIN 2 % TD OINT
1.0000 [in_us] | TOPICAL_OINTMENT | Freq: Once | TRANSDERMAL | Status: AC
  Administered 2022-06-05: 1 [in_us] via TOPICAL
  Filled 2022-06-05: qty 1

## 2022-06-05 MED ORDER — APIXABAN 5 MG PO TABS
5.0000 mg | ORAL_TABLET | Freq: Two times a day (BID) | ORAL | Status: DC
  Administered 2022-06-05: 5 mg via ORAL
  Filled 2022-06-05: qty 1

## 2022-06-05 MED ORDER — FUROSEMIDE 10 MG/ML IJ SOLN
40.0000 mg | Freq: Once | INTRAMUSCULAR | Status: AC
  Administered 2022-06-05: 40 mg via INTRAVENOUS
  Filled 2022-06-05: qty 4

## 2022-06-05 MED ORDER — AMIODARONE HCL 200 MG PO TABS
100.0000 mg | ORAL_TABLET | Freq: Every day | ORAL | Status: DC
  Administered 2022-06-05: 100 mg via ORAL
  Filled 2022-06-05: qty 1

## 2022-06-05 MED ORDER — IRBESARTAN 300 MG PO TABS
150.0000 mg | ORAL_TABLET | Freq: Every day | ORAL | Status: DC
  Administered 2022-06-05: 150 mg via ORAL
  Filled 2022-06-05: qty 1

## 2022-06-05 NOTE — ED Provider Notes (Signed)
[facility] EMERGENCY DEPARTMENT Provider Note   CSN: [id number] Arrival date & time: 06/04/22  1521     History  Chief Complaint  Patient presents with   Chest Pain    Alexis [name] is a 71 y.o. female.   Chest Pain Associated symptoms: fatigue and shortness of breath   Patient presents for exertional shortness of breath, orthopnea, and fatigue.  Medical history includes migraines, DM 2, obesity, HLD, HTN, anxiety, depression, GERD, IBS, OSA, fibromyalgia, neuropathy, paroxysmal atrial fibrillation, CHF, and memory difficulties.  She is followed by [facility] (Dr. Debara Pickett).  Last echocardiogram was in December.  LVEF at that time was 65 to 70% with grade 2 diastolic dysfunction.  She is not currently prescribed any diuretic medication.  She reports that her fatigue and shortness of breath have been worsening over the past week.  Last week, she had a brief episode of chest pain that resolved after 2 minutes.  She has not had any chest pain since that time.  In addition to her respiratory symptoms and fatigue, patient is also experienced BLE edema and abdominal swelling.     Home Medications Prior to Admission medications   Medication Sig Start Date End Date Taking? Authorizing Provider  acetaminophen (TYLENOL) 325 MG tablet Take 2 tablets (650 mg total) by mouth every 6 (six) hours as needed for mild pain or moderate pain. Patient taking differently: Take 325 mg by mouth every 6 (six) hours as needed for mild pain, moderate pain or headache. 12/12/20  Yes Nolberto Hanlon, MD  ACIPHEX 20 MG tablet Take 20 mg by mouth in the morning and at bedtime. 05/29/22  Yes [provider]  ALPRAZolam Duanne Moron) 0.5 MG tablet Take 0.5 mg by mouth at bedtime as needed.   Yes [provider]  apixaban (ELIQUIS) 5 MG TABS tablet Take 1 tablet (5 mg total) by mouth 2 (two) times daily. 07/31/21  Yes Hosie Poisson, MD  b complex vitamins tablet Take 1 tablet by mouth at bedtime.    Yes [provider]  CALCIUM PO Take 1 tablet by mouth at bedtime.   Yes [provider]  FARXIGA 10 MG TABS tablet Take 10 mg by mouth in the morning. 09/10/19  Yes [provider]  fish oil-omega-3 fatty acids 1000 MG capsule Take 1 g by mouth at bedtime.    Yes [provider]  furosemide (LASIX) 20 MG tablet Take 1 tablet (20 mg total) by mouth 2 (two) times daily. 06/05/22 07/05/22 Yes Godfrey Pick, MD  insulin lispro (HUMALOG) 100 UNIT/ML KwikPen Inject 8 Units into the skin 3 (three) times daily.   Yes [provider]  metoprolol succinate (TOPROL-XL) 50 MG 24 hr tablet Take 1 tablet (50 mg total) by mouth daily. Take with or immediately following a meal. Patient taking differently: Take 100 mg by mouth in the morning and at bedtime. Take with or immediately following a meal. 07/29/21  Yes Hosie Poisson, MD  pantoprazole (PROTONIX) 40 MG tablet Take 1 tablet (40 mg total) by mouth 2 (two) times daily. Patient taking differently: Take 40 mg by mouth daily. 07/29/21  Yes Hosie Poisson, MD  pregabalin (LYRICA) 50 MG capsule Take 50 mg by mouth 2 (two) times daily.   Yes [provider]  TRESIBA FLEXTOUCH 100 UNIT/ML FlexTouch Pen Inject 18 Units into the skin at bedtime. 09/29/19  Yes [provider]  TRULICITY 1.5 KT/6.2BW SOPN Inject 1.5 mg into the skin every Sunday. 05/07/21  Yes [provider]  valsartan (DIOVAN) 160 MG tablet Take 1 tablet (160 mg total) by mouth daily. 08/20/21  Yes Dwyane Dee, MD  venlafaxine XR (EFFEXOR-XR) 150 MG 24 hr capsule Take 150 mg by mouth 2 (two) times daily.   Yes [provider]  zaleplon (SONATA) 5 MG capsule Take 5 mg by mouth at bedtime.   Yes [provider]  amiodarone (PACERONE) 200 MG tablet Take 0.5 tablets (100 mg total) by mouth daily. Patient not taking: Reported on 06/05/2022 11/02/21   Pixie Casino, MD  spironolactone (ALDACTONE) 25 MG tablet Take 0.5  tablets (12.5 mg total) by mouth daily. Patient not taking: Reported on 06/05/2022 08/20/21   Dwyane Dee, MD      Allergies    Aspirin, Codeine, Naproxen, and Sulfonamide derivatives    Review of Systems   Review of Systems  Constitutional:  Positive for fatigue.  Respiratory:  Positive for shortness of breath.   Cardiovascular:  Positive for leg swelling.  All other systems reviewed and are negative.   Physical Exam Updated Vital Signs BP (!) 145/62 (BP Location: Right Arm)   Pulse 60   Temp 98 F (36.7 C)   Resp 18   Ht '5\' 4"'$  (1.626 m)   Wt 89.4 kg   SpO2 96%   BMI 33.81 kg/m  Physical Exam Vitals and nursing note reviewed.  Constitutional:      General: She is not in acute distress.    Appearance: She is well-developed. She is not ill-appearing, toxic-appearing or diaphoretic.  HENT:     Head: Normocephalic and atraumatic.  Eyes:     Conjunctiva/sclera: Conjunctivae normal.  Cardiovascular:     Rate and Rhythm: Normal rate and regular rhythm.     Heart sounds: No murmur heard. Pulmonary:     Effort: Pulmonary effort is normal. No respiratory distress.     Breath sounds: Examination of the right-lower field reveals decreased breath sounds. Examination of the left-lower field reveals decreased breath sounds. Decreased breath sounds and rales present. No wheezing or rhonchi.  Chest:     Chest wall: No tenderness.  Abdominal:     Palpations: Abdomen is soft.     Tenderness: There is no abdominal tenderness.  Musculoskeletal:        General: No swelling.     Cervical back: Normal range of motion and neck supple.     Right lower leg: Edema present.     Left lower leg: Edema present.  Skin:    General: Skin is warm and dry.  Neurological:     General: No focal deficit present.     Mental Status: She is alert and oriented to person, place, and time.  Psychiatric:        Mood and Affect: Mood normal.        Behavior: Behavior normal.     ED Results /  Procedures / Treatments   Labs (all labs ordered are listed, but only abnormal results are displayed) Labs Reviewed  BASIC METABOLIC PANEL - Abnormal; Notable for the following components:      Result Value   Glucose, Bld 200 (*)    Calcium 8.7 (*)    All other components within normal limits  CBC - Abnormal; Notable for the following components:   Hemoglobin 11.8 (*)    RDW 16.2 (*)    All other components within normal limits  BRAIN NATRIURETIC PEPTIDE - Abnormal; Notable for the following components:   B Natriuretic  Peptide 1,203.1 (*)    All other components within normal limits  HEPATIC FUNCTION PANEL - Abnormal; Notable for the following components:   Albumin 2.9 (*)    AST 64 (*)    ALT 52 (*)    All other components within normal limits  TSH  MAGNESIUM  TROPONIN I (HIGH SENSITIVITY)  TROPONIN I (HIGH SENSITIVITY)    EKG EKG Interpretation  Date/Time:  Tuesday June 04 2022 15:31:00 EDT Ventricular Rate:  55 PR Interval:  188 QRS Duration: 118 QT Interval:  502 QTC Calculation: 480 R Axis:   -78 Text Interpretation: Sinus bradycardia Left anterior fascicular block Minimal voltage criteria for LVH, may be normal variant ( Cornell product ) Septal infarct , age undetermined Confirmed by Randal Buba, April ([id number]) on 06/05/2022 8:55:29 AM  Radiology DG Chest 2 View  Result Date: 06/04/2022 CLINICAL DATA:  Chest pain EXAM: CHEST - 2 VIEW COMPARISON:  Chest x-ray dated July 24, 2021 FINDINGS: The heart size and mediastinal contours are within normal limits. Trace bilateral pleural effusions and bibasilar atelectasis. Lungs otherwise clear. No evidence of pneumothorax. IMPRESSION: Trace bilateral pleural effusions and bibasilar atelectasis. Electronically Signed   By: Yetta Glassman M.D.   On: 06/04/2022 16:52    Procedures Procedures    Medications Ordered in ED Medications  amiodarone (PACERONE) tablet 100 mg (100 mg Oral Given 06/05/22 0733)  apixaban  (ELIQUIS) tablet 5 mg (5 mg Oral Given 06/05/22 0733)  metoprolol succinate (TOPROL-XL) 24 hr tablet 50 mg (50 mg Oral Given 06/05/22 0732)  irbesartan (AVAPRO) tablet 150 mg (150 mg Oral Given 06/05/22 0733)  nitroGLYCERIN (NITROGLYN) 2 % ointment 1 inch (1 inch Topical Given 06/05/22 0733)  furosemide (LASIX) injection 40 mg (40 mg Intravenous Given 06/05/22 3220)    ED Course/ Medical Decision Making/ A&P Clinical Course as of 06/05/22 1729  Wed Jun 05, 2022  0701 DG Chest 2 View [RD]    Clinical Course User Index [RD] Godfrey Pick, MD                           Medical Decision Making Amount and/or Complexity of Data Reviewed Labs: ordered. Radiology: ordered. Decision-making details documented in ED Course.  Risk Prescription drug management.   This patient presents to the ED for concern of shortness of breath, this involves an extensive number of treatment options, and is a complaint that carries with it a high risk of complications and morbidity.  The differential diagnosis includes CHF exacerbation, pulmonary edema, pneumonia, URI, PE, anemia   Co morbidities that complicate the patient evaluation  migraines, DM 2, obesity, HLD, HTN, anxiety, depression, GERD, IBS, OSA, fibromyalgia, neuropathy, paroxysmal atrial fibrillation, CHF, and memory difficulties   Additional history obtained:  Additional history obtained from patient's daughter External records from outside source obtained and reviewed including EMR   Lab Tests:  I Ordered, and personally interpreted labs.  The pertinent results include: Normal electrolytes, normal troponin, baseline anemia, no leukocytosis, markedly elevated BNP   Imaging Studies ordered:  I ordered imaging studies including x-ray I independently visualized and interpreted imaging which showed bilateral pleural effusions I agree with the radiologist interpretation   Cardiac Monitoring: / EKG:  The patient was maintained on a  cardiac monitor.  I personally viewed and interpreted the cardiac monitored which showed an underlying rhythm of: Sinus rhythm  Problem List / ED Course / Critical interventions / Medication management  Patient presents for exertional shortness of  breath, orthopnea, fatigue, and BLE edema.  Prior to being bedded in the ED, diagnostic work-up was initiated.  EKG shows normal sinus rhythm with no changes from prior tracing.  Lab work shows baseline anemia, no leukocytosis, normal kidney function and normal electrolytes, normal troponin x2, and a significantly elevated BNP.  Chest x-ray shows trace bilateral pleural effusions.  Upon being bedded in the ED, vital signs notable for hypertension in the range of 190s over 60s.  SPO2 is normal on room air.  On exam, patient is resting comfortably.  At rest, while sitting up, she has no increased work of breathing.  She is able to speak in complete sentences.  She describes exertional dyspnea in addition to orthopnea that has worsened over the past week.  She has noticed swelling to her legs and abdomen.  BLE edema is present on exam, extending to level of knees.  Per chart review, she is not on any diuretic medication.  History and work-up results consistent with CHF exacerbation.  Home cardiac medications were ordered.  Patient was given NTG for acute management of blood pressure and to reduce afterload.  Dose of Lasix was given.  Patient had large volume urine output while in the ED.  She had progressive improvement of symptoms.  She was able to ambulate without any shortness of breath.  She had resolution of her hypertension.  Patient is motivated to go home.  Given her improved symptoms, I feel this is reasonable.  She was prescribed Lasix and advised to follow-up with her cardiologist as soon as possible.  She was also encouraged to return to the ED if she does have any return or worsening of symptoms.  She was discharged in stable condition. I ordered medication  including home blood pressure medications and NTG for hypertension; Lasix for diuresis Reevaluation of the patient after these medicines showed that the patient improved I have reviewed the patients home medicines and have made adjustments as needed   Social Determinants of Health:  Has access to outpatient care         Final Clinical Impression(s) / ED Diagnoses Final diagnoses:  Acute on chronic congestive heart failure, unspecified heart failure type ([place])    Rx / DC Orders ED Discharge Orders          Ordered    furosemide (LASIX) 20 MG tablet  2 times daily        06/05/22 1330    Ambulatory referral to Cardiology  Status:  Canceled       Comments: If you have not heard from the Cardiology office within the next 72 hours please call [phone].   06/05/22 1342    Ambulatory referral to Cardiology       Comments: If you have not heard from the Cardiology office within the next 72 hours please call [phone].   06/05/22 1343              Godfrey Pick, MD 06/05/22 1729

## 2022-06-05 NOTE — Discharge Instructions (Addendum)
Continue taking your blood pressure medications as prescribed.  There was an additional medication sent to pharmacy called Lasix.  This is a diuretic ("water pill").  He will help you pee off excess fluid and should help your breathing and leg swelling.  You will need to follow-up with your cardiologist to discuss ongoing use.  You will also need repeat lab work to ensure that your electrolytes remain normal.  Please return to the emergency department at any time for any worsening of symptoms.

## 2022-06-07 ENCOUNTER — Encounter: Payer: Self-pay | Admitting: General Practice

## 2022-06-07 ENCOUNTER — Ambulatory Visit: Payer: Medicare Other | Attending: General Practice | Admitting: General Practice

## 2022-06-07 ENCOUNTER — Telehealth: Payer: Self-pay | Admitting: General Practice

## 2022-06-07 VITALS — BP 124/62 | HR 58 | Ht 64.0 in | Wt 199.2 lb

## 2022-06-07 DIAGNOSIS — I10 Essential (primary) hypertension: Secondary | ICD-10-CM

## 2022-06-07 DIAGNOSIS — I480 Paroxysmal atrial fibrillation: Secondary | ICD-10-CM | POA: Diagnosis not present

## 2022-06-07 DIAGNOSIS — I5031 Acute diastolic (congestive) heart failure: Secondary | ICD-10-CM

## 2022-06-07 DIAGNOSIS — I2510 Atherosclerotic heart disease of native coronary artery without angina pectoris: Secondary | ICD-10-CM

## 2022-06-07 NOTE — Patient Instructions (Signed)
Medication Instructions:  The current medical regimen is effective;  continue present plan and medications as directed. Please refer to the Current Medication list given to you today.  *If you need a refill on your cardiac medications before your next appointment, please call your pharmacy*  Lab Work: NONE  Follow-Up: At [facility], you and your health needs are our priority.  As part of our continuing mission to provide you with exceptional heart care, we have created designated Provider Care Teams.  These Care Teams include your primary Cardiologist (physician) and Advanced Practice Providers (APPs -  Physician Assistants and Nurse Practitioners) who all work together to provide you with the care you need, when you need it.  Your next appointment:   AS SCHEDULED   The format for your next appointment:   In Person  Provider:   Pixie Casino, MD    Other Instructions PLEASE READ AND FOLLOW ATTACHED  SALTY 6  TAKE AND LOG YOUR WEIGHT DAILY  TAKE AND LOG YOUR BLOOD PRESSURE   PLEASE PURCHASE AND WEAR COMPRESSION STOCKINGS DAILY AND TAKE OFF AT BEDTIME. Compression stockings are elastic socks that squeeze the legs. They help to increase blood flow to the legs and to decrease swelling in the legs from fluid retention, and reduce the chance of developing blood clots in the lower legs. Please put on in the AM when dressing and off at night when dressing for bed.  LET THEM KNOW THAT YOU NEED KNEE HIGH'S WITH COMPRESSION OF 15-20 mmhg.  [employer]  [place] ([place] [phone]); [place], [place] [id number]; [phone]  EMAIL   [email or web address].  PLEASE MAKE SURE TO ELEVATE YOUR FEET & LEGS WHILE SITTING, THIS WILL HELP WITH THE SWELLING ALSO.   Important Information About Sugar

## 2022-06-07 NOTE — Progress Notes (Signed)
Cardiology Clinic Note   Patient Name: Alexis [name] Date of Encounter: 06/07/2022  Primary Care Provider:  Holland Commons, FNP Primary Cardiologist:  Pixie Casino, MD  Patient Profile    Alexis [name] 71 year old female presents the clinic today for follow-up evaluation of her acute on chronic diastolic CHF.  Past Medical History    Past Medical History:  Diagnosis Date   Anxiety    Arthritis    "knees" (05/25/2014)   Chronic back pain    Depression    Diabetes mellitus type II    Diabetic peripheral neuropathy ([place]) 10/20/2018   Diverticulitis    Fatty liver    Fibromyalgia    Gastric polyp    hyperplastic   Gastroparesis    "recently dx'd" (05/25/2014)   GERD (gastroesophageal reflux disease)    H/O hiatal hernia    Hx of gastritis    Hyperlipidemia    Hypertension    Irritable bowel syndrome (IBS)    Memory difficulties 09/18/2017   Migraine without aura, without mention of intractable migraine without mention of status migrainosus    "related to allergies; have them in the spring and fall" (05/25/2014)   Obesity    Sleep apnea    "wore mask; took it off in my sleep; quit wearing it" (05/25/2014)   Uterine cancer ([place]) dx'd 2000   surg only   Vasculitis ([place])    "irritates my legs"   Wears glasses    Past Surgical History:  Procedure Laterality Date   ANTERIOR CERVICAL DECOMPRESSION/DISCECTOMY FUSION 4 LEVELS N/A 05/07/2018   Procedure: ANTERIOR CERVICAL DECOMPRESSION FUSION, CERVICAL 4-5,CERVICAL 5-6, CERVICAL 6-7 WITH INSTRUMENTATION AND ALLOGRAFT;  Surgeon: Phylliss Bob, MD;  Location: [place];  Service: Orthopedics;  Laterality: N/A;   APPENDECTOMY  ~ 1967   BIOPSY  07/27/2021   Procedure: BIOPSY;  Surgeon: Yetta Flock, MD;  Location: [place];  Service: Gastroenterology;;   BREAST CYST EXCISION Right 1990   COLONOSCOPY     COLONOSCOPY WITH PROPOFOL N/A 07/27/2021   Procedure: COLONOSCOPY WITH PROPOFOL;  Surgeon:  Yetta Flock, MD;  Location: [place];  Service: Gastroenterology;  Laterality: N/A;   ESOPHAGOGASTRODUODENOSCOPY (EGD) WITH PROPOFOL N/A 07/27/2021   Procedure: ESOPHAGOGASTRODUODENOSCOPY (EGD) WITH PROPOFOL;  Surgeon: Yetta Flock, MD;  Location: [place];  Service: Gastroenterology;  Laterality: N/A;   HEMOSTASIS CLIP PLACEMENT  07/27/2021   Procedure: HEMOSTASIS CLIP PLACEMENT;  Surgeon: Yetta Flock, MD;  Location: [place] ENDOSCOPY;  Service: Gastroenterology;;   [place] WITH CORONARY ANGIOGRAM N/A 05/26/2014   Procedure: LEFT HEART CATHETERIZATION WITH CORONARY ANGIOGRAM;  Surgeon: Troy Sine, MD;  [facility]: [facility] CATH LAB;  Service: Cardiovascular;  Laterality: N/A;   POLYPECTOMY  07/27/2021   Procedure: POLYPECTOMY;  Surgeon: Yetta Flock, MD;  Location: [place];  Service: Gastroenterology;;   Clide Deutscher  07/27/2021   Procedure: Clide Deutscher;  Surgeon: Yetta Flock, MD;  Location[facility]COPY;  Service: Gastroenterology;;   SINUS SURGERY WITH INSTATRAK  2000   TOTAL HIP ARTHROPLASTY Left 12/05/2020   Procedure: TOTAL HIP ARTHROPLASTY ANTERIOR APPROACH;  Surgeon: Rod Can, MD;  Location: [place];  Service: Orthopedics;  Laterality: Left;   TUBAL LIGATION  ~ 1982   VAGINAL HYSTERECTOMY  2000    Allergies  Allergies  Allergen Reactions   Aspirin Other (See Comments)    Reaction: Vasculitis per MD   Codeine Itching and Rash    Tolerated Norco  12/07/20 with no reports of rash/itching. Patient endorsed allergy >30 yr ago and hasn't had reaction since. Climb the walls per pt   Naproxen Nausea Only    Reaction: Makes stomach upset/irritated.   Sulfonamide Derivatives Itching and Rash    History of Present Illness    Alexis [name] is a PMH of HTN, CAD, paroxysmal atrial fibrillation, acute on chronic diastolic CHF, OSA, GERD, type 2 diabetes, anxiety, fibromyalgia,  and anemia.  Her PMH also includes PVCs and PACs.  She wore a cardiac event monitor in 2018 showed short runs of SVT.  Her coronary calcium scoring showed mild two-vessel coronary disease with plaque in her LAD and RCA.  Her coronary calcium score was 90.  She had a repeat sleep study which showed an AHI of 8 per 24 hours.    She was seen by Bunnie Domino, DNP on 05/10/2022.  During that time she reported worsening shortness of breath over the previous 2 weeks.  She noted decreased energy and weight gain.  She had followed up with her PCP who had informed her that her liver enzymes were rising.  She was taken off of atorvastatin.  She reported that she had not been using her CPAP.  Her husband reported that she does snore loudly.  She reported being exhausted during the day.  She was taking acetaminophen for chronic pain.  She was admitted to the hospital on 06/04/2022 and discharged on 06/06/2022.  She presented with exertional shortness of breath, fatigue, and orthopnea.  She indicated that her fatigue and shortness of breath had worsened over the previous week.  She denied chest discomfort.  She also noted bilateral lower extremity edema and abdominal swelling.  Her BMP showed a bit of an elevated glucose of 200 her CBC showed a hemoglobin of 11.8.  Her BNP was elevated at 1203.  Her EKG showed sinus bradycardia with left anterior fascicular block 55 bpm.  Her oxygen saturation was normal on room air.  She had no increased work of breathing at rest.  She was noted to have lower extremity swelling that extended to her knees.  She received IV diuresis and was noted to have good urine output.  She had progressive improvement with her symptoms.  She was able to ambulate without any shortness of breath.  She had resolution of her hypertension.  She was discharged in stable condition.    She is to the clinic today for follow-up evaluation states she is feeling better since being discharged from the hospital.   She has been avoiding salt.  She does notice that elevated her Lasix she is breathing better and has better mobility.  We reviewed the importance of daily weights and lower extremity support stockings.  She expressed understanding.  We reviewed her recent hospitalization.  We also reviewed proper way to measure blood pressure.  I will continue her on furosemide, give her a weight back, have her increase her physical activity as tolerated get the salty 6 diet sheet.  Today's she denies chest pain, shortness of breath, lower extremity edema, fatigue, palpitations, melena, hematuria, hemoptysis, diaphoresis, weakness, presyncope, syncope, orthopnea, and PND.   Home Medications    Prior to Admission medications   Medication Sig Start Date End Date Taking? Authorizing Provider  acetaminophen (TYLENOL) 325 MG tablet Take 2 tablets (650 mg total) by mouth every 6 (six) hours as needed for mild pain or moderate pain. Patient taking differently: Take 325 mg by mouth every 6 (six) hours as  needed for mild pain, moderate pain or headache. 12/12/20   Nolberto Hanlon, MD  ACIPHEX 20 MG tablet Take 20 mg by mouth in the morning and at bedtime. 05/29/22   [provider]  ALPRAZolam Duanne Moron) 0.5 MG tablet Take 0.5 mg by mouth at bedtime as needed.    [provider]  amiodarone (PACERONE) 200 MG tablet Take 0.5 tablets (100 mg total) by mouth daily. Patient not taking: Reported on 06/05/2022 11/02/21   Pixie Casino, MD  apixaban (ELIQUIS) 5 MG TABS tablet Take 1 tablet (5 mg total) by mouth 2 (two) times daily. 07/31/21   Hosie Poisson, MD  b complex vitamins tablet Take 1 tablet by mouth at bedtime.    [provider]  CALCIUM PO Take 1 tablet by mouth at bedtime.    [provider]  FARXIGA 10 MG TABS tablet Take 10 mg by mouth in the morning. 09/10/19   [provider]  fish oil-omega-3 fatty acids 1000 MG capsule Take 1 g by mouth at bedtime.     [provider]  furosemide (LASIX) 20 MG tablet Take 1 tablet (20 mg total) by mouth 2 (two) times daily. 06/05/22 07/05/22  Godfrey Pick, MD  insulin lispro (HUMALOG) 100 UNIT/ML KwikPen Inject 8 Units into the skin 3 (three) times daily.    [provider]  metoprolol succinate (TOPROL-XL) 50 MG 24 hr tablet Take 1 tablet (50 mg total) by mouth daily. Take with or immediately following a meal. Patient taking differently: Take 100 mg by mouth in the morning and at bedtime. Take with or immediately following a meal. 07/29/21   Hosie Poisson, MD  pantoprazole (PROTONIX) 40 MG tablet Take 1 tablet (40 mg total) by mouth 2 (two) times daily. Patient taking differently: Take 40 mg by mouth daily. 07/29/21   Hosie Poisson, MD  pregabalin (LYRICA) 50 MG capsule Take 50 mg by mouth 2 (two) times daily.    [provider]  spironolactone (ALDACTONE) 25 MG tablet Take 0.5 tablets (12.5 mg total) by mouth daily. Patient not taking: Reported on 06/05/2022 08/20/21   Dwyane Dee, MD  TRESIBA FLEXTOUCH 100 UNIT/ML FlexTouch Pen Inject 18 Units into the skin at bedtime. 09/29/19   [provider]  TRULICITY 1.5 QM/5.7QI SOPN Inject 1.5 mg into the skin every Sunday. 05/07/21   [provider]  valsartan (DIOVAN) 160 MG tablet Take 1 tablet (160 mg total) by mouth daily. 08/20/21   Dwyane Dee, MD  venlafaxine XR (EFFEXOR-XR) 150 MG 24 hr capsule Take 150 mg by mouth 2 (two) times daily.    [provider]  zaleplon (SONATA) 5 MG capsule Take 5 mg by mouth at bedtime.    [provider]    Family History    Family History  Problem Relation Age of Onset   Colon polyps Father    Heart disease Father    Breast cancer Sister    COPD Brother    Irritable bowel syndrome Daughter    Ovarian cancer Maternal Aunt    Stomach cancer Maternal Aunt    Diabetes Maternal Aunt    Colon cancer Maternal Uncle    Heart disease Maternal Uncle    Heart disease Other         Grandparents   Esophageal cancer Neg Hx    Pancreatic cancer Neg Hx    She indicated that her mother is alive. She indicated that her father is alive. She indicated that her sister  is alive. She indicated that the status of her brother is unknown. She indicated that her maternal grandmother is deceased. She indicated that her maternal grandfather is deceased. She indicated that her paternal grandmother is deceased. She indicated that her paternal grandfather is deceased. She indicated that two of her three daughters are alive. She indicated that the status of her neg hx is unknown. She indicated that the status of her other is unknown.  Social History    Social History   Socioeconomic History   Marital status: Married    Spouse name: Orit Sanville   Number of children: 2   Years of education: Not on file   Highest education level: Some college, no degree  Occupational History   Occupation: Retired    Fish farm manager: RETIRED  Tobacco Use   Smoking status: Never   Smokeless tobacco: Never  Vaping Use   Vaping Use: Never used  Substance and Sexual Activity   Alcohol use: No   Drug use: No   Sexual activity: Not Currently  Other Topics Concern   Not on file  Social History Narrative   Daily Caffeine, Coke   Social Determinants of Health   Financial Resource Strain: Low Risk  (04/27/2021)   Overall Financial Resource Strain (CARDIA)    Difficulty of Paying Living Expenses: Not very hard  Food Insecurity: No Food Insecurity (04/27/2021)   Hunger Vital Sign    Worried About Running Out of Food in the Last Year: Never true    Ran Out of Food in the Last Year: Never true  Transportation Needs: No Transportation Needs (04/27/2021)   PRAPARE - Hydrologist (Medical): No    Lack of Transportation (Non-Medical): No  Physical Activity: Not on file  Stress: Not on file  Social Connections: Not on file  Intimate Partner Violence: Not on file     Review of  Systems    General:  No chills, fever, night sweats or weight changes.  Cardiovascular:  No chest pain, dyspnea on exertion, edema, orthopnea, palpitations, paroxysmal nocturnal dyspnea. Dermatological: No rash, lesions/masses Respiratory: No cough, dyspnea Urologic: No hematuria, dysuria Abdominal:   No nausea, vomiting, diarrhea, bright red blood per rectum, melena, or hematemesis Neurologic:  No visual changes, wkns, changes in mental status. All other systems reviewed and are otherwise negative except as noted above.  Physical Exam    VS:  BP 124/62   Pulse (!) 58   Ht '5\' 4"'$  (1.626 m)   Wt 199 lb 3.2 oz (90.4 kg)   SpO2 93%   BMI 34.19 kg/m  , BMI Body mass index is 34.19 kg/m. GEN: Well nourished, well developed, in no acute distress. HEENT: normal. Neck: Supple, no JVD, carotid bruits, or masses. Cardiac: RRR, no murmurs, rubs, or gallops. No clubbing, cyanosis, generalized bilateral lower extremity edema.  Radials/DP/PT 2+ and equal bilaterally.  Respiratory:  Respirations regular and unlabored, clear to auscultation bilaterally. GI: Soft, nontender, nondistended, BS + x 4. MS: no deformity or atrophy. Skin: warm and dry, no rash. Neuro:  Strength and sensation are intact. Psych: Normal affect.  Accessory Clinical Findings    Recent Labs: 06/04/2022: B Natriuretic Peptide 1,203.1; BUN 16; Creatinine, Ser 0.88; Hemoglobin 11.8; Platelets 211; Potassium 4.0; Sodium 141 06/05/2022: ALT 52; Magnesium 2.0; TSH 3.668   Recent Lipid Panel    Component Value Date/Time   CHOL 104 07/25/2021 0237   TRIG 87 07/25/2021 0237   HDL 26 (L) 07/25/2021 2956  CHOLHDL 4.0 07/25/2021 0237   VLDL 17 07/25/2021 0237   LDLCALC 61 07/25/2021 0237         ECG personally reviewed by me today-none today.  Echocardiogram 07/25/2021  1. Left ventricular ejection fraction, by estimation, is 65 to 70%. The  left ventricle has normal function. The left ventricle has no regional  wall  motion abnormalities. Left ventricular diastolic parameters are  consistent with Grade II diastolic  dysfunction (pseudonormalization).   2. Right ventricular systolic function is normal. The right ventricular  size is normal.   3. The mitral valve is degenerative. No evidence of mitral valve  regurgitation. No evidence of mitral stenosis. Moderate mitral annular  calcification.   4. The aortic valve is tricuspid. Aortic valve regurgitation is not  visualized. Aortic valve sclerosis is present, with no evidence of aortic  valve stenosis.   5. The inferior vena cava is normal in size with <50% respiratory  variability, suggesting right atrial pressure of 8 mmHg.    VAS [place] AAA Duplex 03/22/2022 Abdominal Aorta: No evidence of an abdominal aortic aneurysm was  visualized. The largest aortic measurement is 2.3 cm.  Stenosis:  Patent bilateral common and external iliac arteries without evidence of  stenosis.    Coronary CTA 11/21/2020 1.  Diffuse nonobstructive CAD, CADRADS = 2.   2. Coronary calcium score of 469. This was 93rd percentile for age and sex matched control.   3. Normal coronary origin with right dominance.   4.  Aortic atherosclerosis   5.  Small PFO without evidence of shunting.   6.  Mitral annular calcification.  Assessment & Plan   1.  Acute on chronic diastolic CHF-weight [id number] lbs.  Reports compliance with diuretic therapy.  Bilateral lower extremity generalized edema. Continue furosemide, metoprolol, spironolactone, valsartan Farxiga Heart healthy low-sodium diet-salty 6 given Increase physical activity as tolerated Daily weights-contact office with a weight increase of 2 to 3 pounds overnight or 5 pounds in 1 week. Elevate lower extremities when not active Lower extremity support stockings  Coronary artery disease-no recent episodes of chest discomfort. Continue metoprolol, Heart healthy low-sodium diet-salty 6 given Increase physical activity as  tolerated  Essential hypertension-BP today 124/62.  Well-controlled at home. Continue metoprolol, spironolactone, valsartan Heart healthy low-sodium diet-salty 6 given Increase physical activity as tolerated  Paroxysmal atrial fibrillation-heart rate today 58.  Reports compliance with apixaban and denies bleeding issues. Continue to follow, apixaban, amiodarone Heart healthy low-sodium diet-salty 6 given Increase physical activity as tolerated  Disposition: Follow-up with Dr. Debara Pickett  As scheduled   Jossie Ng. Koleen Celia NP-C     06/07/2022, 2:02 PM [address] Office [phone] Fax [phone]  Notice: This dictation was prepared with Dragon dictation along with smaller phrase technology. Any transcriptional errors that result from this process are unintentional and may not be corrected upon review.  I spent 14 minutes examining this patient, reviewing medications, and using patient centered shared decision making involving her cardiac care.  Prior to her visit I spent greater than 20 minutes reviewing her past medical history,  medications, and prior cardiac tests.

## 2022-06-07 NOTE — Telephone Encounter (Signed)
Patient called to schedule a hospital f/u asap. Scheduled patient to see Coletta Memos at 1:55 pm today

## 2022-06-20 ENCOUNTER — Telehealth: Payer: Self-pay | Admitting: Internal Medicine

## 2022-06-20 NOTE — Telephone Encounter (Signed)
   Pre-operative Risk Assessment    Patient Name: Alexis [name]  DOB: 1951/05/12 MRN: [id number]      Request for Surgical Clearance    Procedure:   L34 Lumbar Interlaminar Epidural Steroid Injection  Date of Surgery:  Clearance TBD                                 Surgeon:  Dr. Normajean Glasgow Surgeon's Group or Practice Name:  [facility] Phone number:  [phone]  Fax number:  [phone]   Type of Clearance Requested:   - Medical  - Pharmacy:  Hold Apixaban (Eliquis)     Type of Anesthesia:  Local    Additional requests/questions:   Caller is requesting the patient's Eliquis be held 48 hours prior to injection.  Signed, Heloise Beecham   06/20/2022, 2:44 PM

## 2022-06-21 ENCOUNTER — Ambulatory Visit (HOSPITAL_BASED_OUTPATIENT_CLINIC_OR_DEPARTMENT_OTHER): Payer: Medicare Other | Attending: Adult Health | Admitting: Cardiovascular Disease

## 2022-06-21 DIAGNOSIS — G4733 Obstructive sleep apnea (adult) (pediatric): Secondary | ICD-10-CM | POA: Insufficient documentation

## 2022-06-21 DIAGNOSIS — Z8669 Personal history of other diseases of the nervous system and sense organs: Secondary | ICD-10-CM

## 2022-06-21 DIAGNOSIS — G4736 Sleep related hypoventilation in conditions classified elsewhere: Secondary | ICD-10-CM | POA: Insufficient documentation

## 2022-06-24 NOTE — Telephone Encounter (Signed)
Patient with diagnosis of atria fibrillation on Eliquis for anticoagulation.    Procedure: L3-4 lumbar Interlaminar ESI Date of procedure: TBD   CHA2DS2-VASc Score = 6   This indicates a 9.7% annual risk of stroke. The patient's score is based upon: CHF History: 1 HTN History: 1 Diabetes History: 1 Stroke History: 0 Vascular Disease History: 1 Age Score: 1 Gender Score: 1    CrCl 65 (with adjusted body weight) Platelet count 211  Per office protocol, patient can hold Eliquis for 3 days prior to procedure.  (Our protocol is to hold apixaban for 3 days for any spinal procedures)   Patient will not need bridging with Lovenox (enoxaparin) around procedure.  **This guidance is not considered finalized until pre-operative APP has relayed final recommendations.**'

## 2022-06-26 NOTE — Telephone Encounter (Signed)
   Patient Name: Alexis [name]  DOB: [birth year] MRN: [id number]  Primary Cardiologist: Pixie Casino, MD  Chart reviewed as part of pre-operative protocol coverage. Pre-op clearance already addressed by colleagues in earlier phone notes. To summarize recommendations:  -Per office protocol, patient can hold Eliquis for 3 days prior to procedure.  (Our protocol is to hold apixaban for 3 days for any spinal procedures)  -Low risk for ESI from a cardiac standpoint.  -Coletta Memos, NP-C   Patient will not need bridging with Lovenox (enoxaparin) around procedure.  Will route this bundled recommendation to requesting provider via Epic fax function and remove from pre-op pool. Please call with questions.  Elgie Collard, PA-C 06/26/2022, 8:19 AM

## 2022-06-27 ENCOUNTER — Ambulatory Visit (INDEPENDENT_AMBULATORY_CARE_PROVIDER_SITE_OTHER): Payer: Medicare Other | Admitting: Physician Assistant

## 2022-06-27 ENCOUNTER — Encounter: Payer: Self-pay | Admitting: Physician Assistant

## 2022-06-27 VITALS — BP 142/72 | HR 66 | Ht 64.0 in | Wt 191.0 lb

## 2022-06-27 DIAGNOSIS — K760 Fatty (change of) liver, not elsewhere classified: Secondary | ICD-10-CM

## 2022-06-27 DIAGNOSIS — R7989 Other specified abnormal findings of blood chemistry: Secondary | ICD-10-CM | POA: Diagnosis not present

## 2022-06-27 NOTE — Progress Notes (Signed)
____________________________________________________________  Attending physician addendum:  Thank you for sending this case to me. I have reviewed the entire note and agree with the plan.  Agree with rechecking hepatic function panel in 4 to 6 weeks.  It may very well have been an acute rise due to antibiotics, however she is also listed as being on on amiodarone.  Wilfrid Lund, MD  ____________________________________________________________

## 2022-06-27 NOTE — Patient Instructions (Signed)
Your provider has requested that you go to the basement level for lab work before on 07/15/22. Press "B" on the elevator. The lab is located at the first door on the left as you exit the elevator.    _____________________________________________________  If you are age 70 or older, your body mass index should be between 23-30. Your Body mass index is 32.79 kg/m. If this is out of the aforementioned range listed, please consider follow up with your Primary Care Provider.  If you are age 21 or younger, your body mass index should be between 19-25. Your Body mass index is 32.79 kg/m. If this is out of the aformentioned range listed, please consider follow up with your Primary Care Provider.   _____________________________________________________  The [facility] providers would like to encourage you to use [facility] to communicate with providers for non-urgent requests or questions.  Due to long hold times on the telephone, sending your provider a message by [facility] may be a faster and more efficient way to get a response.  Please allow 48 business hours for a response.  Please remember that this is for non-urgent requests.  _______________________________________________________  Due to recent changes in healthcare laws, you may see the results of your imaging and laboratory studies on MyChart before your provider has had a chance to review them.  We understand that in some cases there may be results that are confusing or concerning to you. Not all laboratory results come back in the same time frame and the provider may be waiting for multiple results in order to interpret others.  Please give [place] 48 hours in order for your provider to thoroughly review all the results before contacting the office for clarification of your results.   Thank you for choosing me and [facility].  Ellouise Newer PA-C

## 2022-06-27 NOTE — Progress Notes (Signed)
Chief Complaint:  Elevated LFT's  HPI:    Alexis [name] is a 70 year old female with a past medical history as listed below including fatty liver, reflux and gastroparesis, known to Dr. Loletha Carrow, who was referred to me by Deland Pretty, MD for a complaint of elevated LFTs.    08/18/2021 CTAP without contrast with liver slightly hyperdense.  Otherwise moderate amount of retained stool in the colon suggesting constipation, cardiomegaly and aortic atherosclerosis as well as nonobstructive right renal calculi.    05/15/2022 GGT 276 (05/01/2022 GGT 285) and 05/15/2022 alk phos 156 (11/26/2021 111), ALT 146 (88 on 11/26/2021), AST 206 (72 on 11/26/2021).  Total bilirubin normal.  CMP otherwise normal other than elevated creatinine at 1.19 and glucose 152.    06/05/2022 hepatic function panel with an AST of 64 (206 on 05/15/22), ALT 52 (146 on 05/15/2022).  Normal alk phos.    Today, patient presents to clinic and tells me that she was told she had elevated liver enzymes.  Interestingly she was on an antibiotic around the timeframe that she had her elevated liver enzymes for a UTI.  Since then they have come back down at last check a couple of weeks ago.  Denies any abdominal symptoms.  Still with chronic constipation but no new problems.   Tells me she was diagnosed with fatty liver back in the 2000's and Dr. Olevia Perches did a full work-up and did not find anything else.    Denies fever, chills, weight loss or blood in her stool.  Past Medical History:  Diagnosis Date   Anxiety    Arthritis    "knees" (05/25/2014)   Chronic back pain    Depression    Diabetes mellitus type II    Diabetic peripheral neuropathy ([place]) 10/20/2018   Diverticulitis    Fatty liver    Fibromyalgia    Gastric polyp    hyperplastic   Gastroparesis    "recently dx'd" (05/25/2014)   GERD (gastroesophageal reflux disease)    H/O hiatal hernia    Hx of gastritis    Hyperlipidemia    Hypertension    Irritable bowel syndrome (IBS)     Memory difficulties 09/18/2017   Migraine without aura, without mention of intractable migraine without mention of status migrainosus    "related to allergies; have them in the spring and fall" (05/25/2014)   Obesity    Sleep apnea    "wore mask; took it off in my sleep; quit wearing it" (05/25/2014)   Uterine cancer ([place]) dx'd 2000   surg only   Vasculitis ([place])    "irritates my legs"   Wears glasses     Past Surgical History:  Procedure Laterality Date   ANTERIOR CERVICAL DECOMPRESSION/DISCECTOMY FUSION 4 LEVELS N/A 05/07/2018   Procedure: ANTERIOR CERVICAL DECOMPRESSION FUSION, CERVICAL 4-5,CERVICAL 5-6, CERVICAL 6-7 WITH INSTRUMENTATION AND ALLOGRAFT;  Surgeon: Phylliss Bob, MD;  Location: [facility];  Service: Orthopedics;  Laterality: N/A;   APPENDECTOMY  ~ 1967   BIOPSY  07/27/2021   Procedure: BIOPSY;  Surgeon: Yetta Flock, MD;  Location: [facility];  Service: Gastroenterology;;   BREAST CYST EXCISION Right 1990   COLONOSCOPY     COLONOSCOPY WITH PROPOFOL N/A 07/27/2021   Procedure: COLONOSCOPY WITH PROPOFOL;  Surgeon: Yetta Flock, MD;  Location: [facility];  Service: Gastroenterology;  Laterality: N/A;   ESOPHAGOGASTRODUODENOSCOPY (EGD) WITH PROPOFOL N/A 07/27/2021   Procedure: ESOPHAGOGASTRODUODENOSCOPY (EGD) WITH PROPOFOL;  Surgeon: Yetta Flock, MD;  Location: [facility];  Service: Gastroenterology;  Laterality: N/A;   HEMOSTASIS CLIP PLACEMENT  07/27/2021   Procedure: HEMOSTASIS CLIP PLACEMENT;  Surgeon: Yetta Flock, MD;  Location: [facility]COPY;  Service: Gastroenterology;;   [place] WITH CORONARY ANGIOGRAM N/A 05/26/2014   Procedure: LEFT HEART CATHETERIZATION WITH CORONARY ANGIOGRAM;  Surgeon: Troy Sine, MD;  [facility]: [facility] CATH LAB;  Service: Cardiovascular;  Laterality: N/A;   POLYPECTOMY  07/27/2021   Procedure: POLYPECTOMY;  Surgeon: Yetta Flock, MD;  Location:  [facility];  Service: Gastroenterology;;   Clide Deutscher  07/27/2021   Procedure: Clide Deutscher;  Surgeon: Yetta Flock, MD;  Location: [facility] ENDOSCOPY;  Service: Gastroenterology;;   SINUS SURGERY WITH INSTATRAK  2000   TOTAL HIP ARTHROPLASTY Left 12/05/2020   Procedure: TOTAL HIP ARTHROPLASTY ANTERIOR APPROACH;  Surgeon: Rod Can, MD;  Location: [facility];  Service: Orthopedics;  Laterality: Left;   TUBAL LIGATION  ~ 1982   VAGINAL HYSTERECTOMY  2000    Current Outpatient Medications  Medication Sig Dispense Refill   acetaminophen (TYLENOL) 325 MG tablet Take 2 tablets (650 mg total) by mouth every 6 (six) hours as needed for mild pain or moderate pain. (Patient taking differently: Take 325 mg by mouth every 6 (six) hours as needed for mild pain, moderate pain or headache.)     ACIPHEX 20 MG tablet Take 20 mg by mouth in the morning and at bedtime.     ALPRAZolam (XANAX) 0.5 MG tablet Take 0.5 mg by mouth at bedtime as needed.     amiodarone (PACERONE) 200 MG tablet Take 0.5 tablets (100 mg total) by mouth daily. 45 tablet 3   apixaban (ELIQUIS) 5 MG TABS tablet Take 1 tablet (5 mg total) by mouth 2 (two) times daily. 60 tablet    b complex vitamins tablet Take 1 tablet by mouth at bedtime.     CALCIUM PO Take 1 tablet by mouth at bedtime.     FARXIGA 10 MG TABS tablet Take 10 mg by mouth in the morning.     fish oil-omega-3 fatty acids 1000 MG capsule Take 1 g by mouth at bedtime.      furosemide (LASIX) 20 MG tablet Take 1 tablet (20 mg total) by mouth 2 (two) times daily. 60 tablet 0   insulin lispro (HUMALOG) 100 UNIT/ML KwikPen Inject 8 Units into the skin 3 (three) times daily.     metoprolol succinate (TOPROL-XL) 50 MG 24 hr tablet Take 1 tablet (50 mg total) by mouth daily. Take with or immediately following a meal. (Patient taking differently: Take 100 mg by mouth in the morning and at bedtime. Take with or immediately following a meal.)     pregabalin (LYRICA) 50 MG  capsule Take 50 mg by mouth 2 (two) times daily.     TRESIBA FLEXTOUCH 100 UNIT/ML FlexTouch Pen Inject 18 Units into the skin at bedtime.     TRULICITY 1.5 EB/5.8XE SOPN Inject 1.5 mg into the skin every Sunday.     valsartan (DIOVAN) 160 MG tablet Take 1 tablet (160 mg total) by mouth daily. 30 tablet 3   venlafaxine XR (EFFEXOR-XR) 150 MG 24 hr capsule Take 150 mg by mouth 2 (two) times daily.     zaleplon (SONATA) 5 MG capsule Take 5 mg by mouth at bedtime.     pantoprazole (PROTONIX) 40 MG tablet Take 1 tablet (40 mg total) by mouth 2 (two) times daily. (Patient not taking: Reported on 06/27/2022) 60 tablet 2  spironolactone (ALDACTONE) 25 MG tablet Take 0.5 tablets (12.5 mg total) by mouth daily. (Patient not taking: Reported on 06/27/2022) 30 tablet 2   No current facility-administered medications for this visit.    Allergies as of 06/27/2022 - Review Complete 06/27/2022  Allergen Reaction Noted   Aspirin Other (See Comments) 02/09/2014   Codeine Itching and Rash 08/24/2010   Naproxen Nausea Only 02/09/2014   Sulfonamide derivatives Itching and Rash 08/24/2010    Family History  Problem Relation Age of Onset   Colon polyps Father    Heart disease Father    Breast cancer Sister    COPD Brother    Irritable bowel syndrome Daughter    Ovarian cancer Maternal Aunt    Stomach cancer Maternal Aunt    Diabetes Maternal Aunt    Colon cancer Maternal Uncle    Heart disease Maternal Uncle    Heart disease Other        Grandparents   Esophageal cancer Neg Hx    Pancreatic cancer Neg Hx     Social History   Socioeconomic History   Marital status: Married    Spouse name: Rasha Ibe   Number of children: 2   Years of education: Not on file   Highest education level: Some college, no degree  Occupational History   Occupation: Retired    Fish farm manager: RETIRED  Tobacco Use   Smoking status: Never   Smokeless tobacco: Never  Vaping Use   Vaping Use: Never used  Substance  and Sexual Activity   Alcohol use: No   Drug use: No   Sexual activity: Not Currently  Other Topics Concern   Not on file  Social History Narrative   Daily Caffeine, Coke   Social Determinants of Health   Financial Resource Strain: Low Risk  (04/27/2021)   Overall Financial Resource Strain (CARDIA)    Difficulty of Paying Living Expenses: Not very hard  Food Insecurity: No Food Insecurity (04/27/2021)   Hunger Vital Sign    Worried About Running Out of Food in the Last Year: Never true    [place] in the Last Year: Never true  Transportation Needs: No Transportation Needs (04/27/2021)   PRAPARE - Hydrologist (Medical): No    Lack of Transportation (Non-Medical): No  Physical Activity: Not on file  Stress: Not on file  Social Connections: Not on file  Intimate Partner Violence: Not on file    Review of Systems:    Constitutional: No weight loss, fever or chills Cardiovascular: No chest pain Respiratory: No SOB  Gastrointestinal: See HPI and otherwise negative   Physical Exam:  Vital signs: BP (!) 142/72   Pulse 66   Ht _0  (1.626 m)   Wt 191 lb (86.6 kg)   BMI 32.79 kg/m    Constitutional:   Pleasant Elderly Caucasian female appears to be in NAD, Well developed, Well nourished, alert and cooperative Respiratory: Respirations even and unlabored. Lungs clear to auscultation bilaterally.   No wheezes, crackles, or rhonchi.  Cardiovascular: Normal S1, S2. No MRG. Regular rate and rhythm. No peripheral edema, cyanosis or pallor.  Gastrointestinal:  Soft, nondistended, nontender. No rebound or guarding. Normal bowel sounds. No appreciable masses or hepatomegaly. Rectal:  Not performed.  Psychiatric:Demonstrates good judgement and reason without abnormal affect or behaviors.  RELEVANT LABS AND IMAGING: CBC    Component Value Date/Time   WBC 7.1 06/04/2022 1622   RBC 4.08 06/04/2022 1622   HGB 11.8 (  L) 06/04/2022 1622   HCT 36.5  06/04/2022 1622   PLT 211 06/04/2022 1622   MCV 89.5 06/04/2022 1622   MCH 28.9 06/04/2022 1622   MCHC 32.3 06/04/2022 1622   RDW 16.2 (H) 06/04/2022 1622   LYMPHSABS 2.2 08/17/2021 1658   MONOABS 1.0 08/17/2021 1658   EOSABS 0.2 08/17/2021 1658   BASOSABS 0.1 08/17/2021 1658    CMP     Component Value Date/Time   NA 141 06/04/2022 1622   NA 143 10/30/2021 1228   K 4.0 06/04/2022 1622   CL 107 06/04/2022 1622   CO2 27 06/04/2022 1622   GLUCOSE 200 (H) 06/04/2022 1622   BUN 16 06/04/2022 1622   BUN 20 10/30/2021 1228   CREATININE 0.88 06/04/2022 1622   CALCIUM 8.7 (L) 06/04/2022 1622   PROT 6.9 06/05/2022 0309   PROT 7.2 01/29/2021 1252   ALBUMIN 2.9 (L) 06/05/2022 0309   ALBUMIN 4.0 01/29/2021 1252   AST 64 (H) 06/05/2022 0309   ALT 52 (H) 06/05/2022 0309   ALKPHOS 121 06/05/2022 0309   BILITOT 0.8 06/05/2022 0309   BILITOT 0.4 01/29/2021 1252   GFRNONAA >60 06/04/2022 1622   GFRAA >60 05/07/2018 2567    Assessment: 1.  Elevated LFTs: See HPI, currently returning back to her baseline over the past year, known fatty liver, previous work-up by Dr. Olevia Perches in 2013, see those labs 04/22/2012 including IgG, IgM, mitochondrial antibodies, ANA, viral hepatitis labs, ceruloplasmin all of which were normal, last CT with fatty liver in January, patient on recent antibiotics; most likely daily now that the liver enzymes are returning back to her baseline  Plan: 1.  Discussed with patient that since her liver enzymes are trending back down again we should hold on repeat work-up.  Explained that we will recheck her liver enzymes in 3 to 4 weeks.  Pending results then we can consider repeat right upper quadrant ultrasound +/- other labs and imaging.  Patient was happy with this plan. 2.  Patient to follow in clinic with [place] as needed.  Ellouise Newer, PA-C [facility] 06/27/2022, 2:00 PM  Cc: Deland Pretty, MD

## 2022-07-01 ENCOUNTER — Encounter (HOSPITAL_BASED_OUTPATIENT_CLINIC_OR_DEPARTMENT_OTHER): Payer: Self-pay | Admitting: Cardiovascular Disease

## 2022-07-01 NOTE — Procedures (Signed)
      Patient Name: Alexis [name], Alexis [name] Date: 06/23/2022 Gender: Female D.O.B: 30-May-1951 Age (years): 75 Referring Provider: Shelva Majestic MD, ABSM Height (inches): 65 Interpreting Physician: Shelva Majestic MD, ABSM Weight (lbs): 199 RPSGT: Jacolyn Reedy BMI: 33 MRN: [id number] Neck Size: 16.00  CLINICAL INFORMATION Sleep Study Type: HST  Indication for sleep study: OSA, snoring  Epworth Sleepiness Score: 5  Most recent polysomnogram dated 10/03/2016 revealed an AHI of 26.1/h and RDI of 26.9/h. Most recent titration study dated 12/24/2016 was optimal at 8 cm H2O with an AHI of 1.5/h. Patient had intermittent use and had not used CPAP for some time.  SLEEP STUDY TECHNIQUE A multi-channel overnight portable sleep study was performed. The channels recorded were: nasal airflow, thoracic respiratory movement, and oxygen saturation with a pulse oximetry. Snoring was also monitored.  MEDICATIONS acetaminophen (TYLENOL) 325 MG tablet ACIPHEX 20 MG tablet ALPRAZolam (XANAX) 0.5 MG tablet amiodarone (PACERONE) 200 MG tablet apixaban (ELIQUIS) 5 MG TABS tablet b complex vitamins tablet CALCIUM PO FARXIGA 10 MG TABS tablet fish oil-omega-3 fatty acids 1000 MG capsule furosemide (LASIX) 20 MG tablet insulin lispro (HUMALOG) 100 UNIT/ML KwikPen metoprolol succinate (TOPROL-XL) 50 MG 24 hr tablet pantoprazole (PROTONIX) 40 MG tablet pregabalin (LYRICA) 50 MG capsule spironolactone (ALDACTONE) 25 MG tablet TRESIBA FLEXTOUCH 100 UNIT/ML FlexTouch Pen TRULICITY 1.5 SE/3.9RV SOPN valsartan (DIOVAN) 160 MG tablet venlafaxine XR (EFFEXOR-XR) 150 MG 24 hr capsule zaleplon (SONATA) 5 MG capsule Patient self administered medications include: XANAX, B-12, METOPROLOL SUCCINATE, MINIVELLE, ACIPHEX, TRAMADOL, EFFEXOR XR, SONATA.  SLEEP ARCHITECTURE Patient was studied for 363.3 minutes. The sleep efficiency was 100.0 % and the patient was supine for 0%. The arousal index was 0.0 per  hour.  RESPIRATORY PARAMETERS The overall AHI was 22.6 per hour, with a central apnea index of 0 per hour.  The oxygen nadir was 79% during sleep. Time psent < 89% was 8.1 minutes.  CARDIAC DATA Mean heart rate during sleep was 61.6 bpm. Heart rate range 59 - 67 bpm.  IMPRESSIONS - Moderate obstructive sleep apnea occurred during this study (AHI  22.6/h). - Severe oxygen desaturation to a nadir of 79%. - Patient snored 3.4% during the sleep.  DIAGNOSIS - Obstructive Sleep Apnea (G47.33) - Nocturnal Hypoxemia (G47.36)  RECOMMENDATIONS - Recommend re-institution of CPAP therapy. Can initiate a trial of Auto-PAP with EPR of 3 at 6 - 16 cm of water. - Effort should be made to optimize nasal and oropharyngeal patency. - Avoid alcohol, sedatives and other CNS depressants that may worsen sleep apnea and disrupt normal sleep architecture. - Sleep hygiene should be reviewed to assess factors that may improve sleep quality. - Weight management (BMI 33) and regular exercise should be initiated or continued. - Recommend a download and sleep clinic evaluation after 4 weeks of therapy.    [Electronically signed] 07/01/2022 11:54 AM  Shelva Majestic MD, [employer], [place], American Board of Sleep Medicine  NPI: [id number]  [place] PH: [phone]   FX: [phone] [place]

## 2022-07-02 ENCOUNTER — Telehealth: Payer: Self-pay | Admitting: *Deleted

## 2022-07-02 NOTE — Telephone Encounter (Signed)
-----   Message from Troy Sine, MD sent at 07/01/2022 12:00 PM EST ----- Mariann Laster, please notify pt of results and set up with DME for Auto-PAP

## 2022-07-02 NOTE — Telephone Encounter (Signed)
Patient notified of sleep study results and recommendations. She agrees to proceed with CPAP. Order for APAP sent to [facility] via [facility] portal.

## 2022-07-03 NOTE — Telephone Encounter (Signed)
I

## 2022-07-15 ENCOUNTER — Other Ambulatory Visit (INDEPENDENT_AMBULATORY_CARE_PROVIDER_SITE_OTHER): Payer: Medicare Other

## 2022-07-15 DIAGNOSIS — K760 Fatty (change of) liver, not elsewhere classified: Secondary | ICD-10-CM | POA: Diagnosis not present

## 2022-07-15 DIAGNOSIS — R7989 Other specified abnormal findings of blood chemistry: Secondary | ICD-10-CM

## 2022-07-15 LAB — HEPATIC FUNCTION PANEL
ALT: 41 U/L — ABNORMAL HIGH (ref 0–35)
AST: 69 U/L — ABNORMAL HIGH (ref 0–37)
Albumin: 3.3 g/dL — ABNORMAL LOW (ref 3.5–5.2)
Alkaline Phosphatase: 152 U/L — ABNORMAL HIGH (ref 39–117)
Bilirubin, Direct: 0.2 mg/dL (ref 0.0–0.3)
Total Bilirubin: 0.6 mg/dL (ref 0.2–1.2)
Total Protein: 6.9 g/dL (ref 6.0–8.3)

## 2022-08-06 ENCOUNTER — Other Ambulatory Visit (HOSPITAL_BASED_OUTPATIENT_CLINIC_OR_DEPARTMENT_OTHER): Payer: Self-pay | Admitting: Internal Medicine

## 2022-08-16 ENCOUNTER — Encounter (HOSPITAL_COMMUNITY): Payer: Self-pay

## 2022-08-16 ENCOUNTER — Emergency Department (HOSPITAL_COMMUNITY)
Admission: EM | Admit: 2022-08-16 | Discharge: 2022-08-16 | Disposition: A | Discharge status: Home / Self Care | Payer: Medicare Other | Attending: Emergency Medicine | Admitting: Emergency Medicine

## 2022-08-16 ENCOUNTER — Other Ambulatory Visit: Payer: Self-pay

## 2022-08-16 ENCOUNTER — Emergency Department (HOSPITAL_COMMUNITY): Payer: Medicare Other

## 2022-08-16 DIAGNOSIS — E119 Type 2 diabetes mellitus without complications: Secondary | ICD-10-CM | POA: Insufficient documentation

## 2022-08-16 DIAGNOSIS — Y998 Other external cause status: Secondary | ICD-10-CM | POA: Diagnosis not present

## 2022-08-16 DIAGNOSIS — S0990XA Unspecified injury of head, initial encounter: Secondary | ICD-10-CM | POA: Insufficient documentation

## 2022-08-16 DIAGNOSIS — Y92511 Restaurant or cafe as the place of occurrence of the external cause: Secondary | ICD-10-CM | POA: Diagnosis not present

## 2022-08-16 DIAGNOSIS — Y9301 Activity, walking, marching and hiking: Secondary | ICD-10-CM | POA: Insufficient documentation

## 2022-08-16 DIAGNOSIS — W101XXA Fall (on)(from) sidewalk curb, initial encounter: Secondary | ICD-10-CM | POA: Insufficient documentation

## 2022-08-16 DIAGNOSIS — W19XXXA Unspecified fall, initial encounter: Secondary | ICD-10-CM

## 2022-08-16 DIAGNOSIS — I10 Essential (primary) hypertension: Secondary | ICD-10-CM | POA: Diagnosis not present

## 2022-08-16 DIAGNOSIS — M25562 Pain in left knee: Secondary | ICD-10-CM

## 2022-08-16 DIAGNOSIS — S8992XA Unspecified injury of left lower leg, initial encounter: Secondary | ICD-10-CM | POA: Insufficient documentation

## 2022-08-16 NOTE — ED Provider Triage Note (Signed)
Emergency Medicine Provider Triage Evaluation Note  Alexis [name] , a 72 y.o. female  was evaluated in triage.  Pt complains of fall.  Was walking into a restaurant earlier this morning when she stepped onto the curb with her left knee.  Her knee gave out.  She landed on her left knee and hit her head.  She is on Eliquis.  She did not lose consciousness.  She has no neurologic complaints at this time.  She went to [facility] who then sent her here for head CT and CT of left knee to rule out tibial plateau fracture.  Review of Systems  Positive: As above Negative: As above  Physical Exam  BP (!) 187/61 (BP Location: Right Arm)   Pulse 65   Temp 98.4 F (36.9 C) (Oral)   Resp 18   Ht '5\' 4"'$  (1.626 m)   Wt 86.2 kg   SpO2 91%   BMI 32.61 kg/m  Gen:   Awake, no distress   Resp:  Normal effort  MSK:   Moves extremities without difficulty  Other:  Left knee immobilizer in place, small abrasion to the left temple  Medical Decision Making  Medically screening exam initiated at 4:07 PM.  Appropriate orders placed.  [name] was informed that the remainder of the evaluation will be completed by another provider, this initial triage assessment does not replace that evaluation, and the importance of remaining in the ED until their evaluation is complete.  CT head and left knee ordered   Roylene Reason, Hershal Coria 08/16/22 1608

## 2022-08-16 NOTE — Discharge Instructions (Signed)
You have been seen today for your complaint of fall, left knee pain. Your imaging was reassuring and showed no abnormalities. Your discharge medications include Tylenol.  You may take up to 1000 mg every 6 hours for pain. Follow up with: Dr. Rolena Infante.  He is not orthopedic surgeon with [facility].  He should call the number listed in this packet on Monday to schedule an appointment for an ED follow-up visit Please seek immediate medical care if you develop any of the following symptoms: Your knee swells, and the swelling gets worse. You cannot move your knee. You have very bad knee pain that does not get better with pain medicine. At this time there does not appear to be the presence of an emergent medical condition, however there is always the potential for conditions to change. Please read and follow the below instructions.  Do not take your medicine if  develop an itchy rash, swelling in your mouth or lips, or difficulty breathing; call 911 and seek immediate emergency medical attention if this occurs.  You may review your lab tests and imaging results in their entirety on your MyChart account.  Please discuss all results of fully with your primary care provider and other specialist at your follow-up visit.  Note: Portions of this text may have been transcribed using voice recognition software. Every effort was made to ensure accuracy; however, inadvertent computerized transcription errors may still be present.

## 2022-08-16 NOTE — ED Triage Notes (Signed)
Patient states she was at a restaurant this AM and was going up a step and her left leg "gave out." Patient went to [facility]. Patient states she has a laceration to the top of the left knee. Patient also has an abrasion to the left side of her left eye.  Patient was sent to the ED due to being on Eliquis and needing a CT scan.

## 2022-08-16 NOTE — ED Provider Notes (Signed)
[facility] DEPT Provider Note   CSN: [id number] Arrival date & time: 08/16/22  1439     History  Chief Complaint  Patient presents with   Fall   Leg Injury    Alexis [name] is a 72 y.o. female.  With history of type 2 diabetes, obesity, hyperlipidemia, anxiety, depression, hypertension, IBS, arthritis, fibromyalgia, left total hip placement, A-fib currently on Eliquis who presents to the ED for evaluation of a fall.  She was walking into a restaurant this morning when she stepped up onto the curb with her left leg.  She states the leg gave out and she landed on her left knee.  She did also hit her head.  She did not lose consciousness.  She initially presented to [facility] urgent care and was sent to the ED for CT scan of her left knee to rule out tibial plateau fracture.  States the pain is manageable at rest.  Denies numbness or tingling to the affected extremity.  Denies headaches, vision changes, other neurologic symptoms.  She does have a walker at home and knows how to use it.  She states that it is common for her left leg to give out after she had her left total hip replacement a few years ago.   Fall       Home Medications Prior to Admission medications   Medication Sig Start Date End Date Taking? Authorizing Provider  acetaminophen (TYLENOL) 325 MG tablet Take 2 tablets (650 mg total) by mouth every 6 (six) hours as needed for mild pain or moderate pain. Patient taking differently: Take 325 mg by mouth every 6 (six) hours as needed for mild pain, moderate pain or headache. 12/12/20   Nolberto Hanlon, MD  ACIPHEX 20 MG tablet Take 20 mg by mouth in the morning and at bedtime. 05/29/22   [provider]  ALPRAZolam Duanne Moron) 0.5 MG tablet Take 0.5 mg by mouth at bedtime as needed.    [provider]  amiodarone (PACERONE) 200 MG tablet Take 0.5 tablets (100 mg total) by mouth daily. 11/02/21   Hilty, Nadean Corwin, MD  apixaban  (ELIQUIS) 5 MG TABS tablet Take 1 tablet (5 mg total) by mouth 2 (two) times daily. 07/31/21   Hosie Poisson, MD  b complex vitamins tablet Take 1 tablet by mouth at bedtime.    [provider]  CALCIUM PO Take 1 tablet by mouth at bedtime.    [provider]  FARXIGA 10 MG TABS tablet Take 10 mg by mouth in the morning. 09/10/19   [provider]  fish oil-omega-3 fatty acids 1000 MG capsule Take 1 g by mouth at bedtime.     [provider]  furosemide (LASIX) 20 MG tablet TAKE 1 TABLET BY MOUTH EVERY DAY 08/06/22   Hilty, Nadean Corwin, MD  insulin lispro (HUMALOG) 100 UNIT/ML KwikPen Inject 8 Units into the skin 3 (three) times daily.    [provider]  metoprolol succinate (TOPROL-XL) 50 MG 24 hr tablet Take 1 tablet (50 mg total) by mouth daily. Take with or immediately following a meal. Patient taking differently: Take 100 mg by mouth in the morning and at bedtime. Take with or immediately following a meal. 07/29/21   Hosie Poisson, MD  pantoprazole (PROTONIX) 40 MG tablet Take 1 tablet (40 mg total) by mouth 2 (two) times daily. Patient not taking: Reported on 06/27/2022 07/29/21   Hosie Poisson, MD  pregabalin (LYRICA) 50 MG capsule Take 50 mg  by mouth 2 (two) times daily.    [provider]  spironolactone (ALDACTONE) 25 MG tablet Take 0.5 tablets (12.5 mg total) by mouth daily. Patient not taking: Reported on 06/27/2022 08/20/21   Dwyane Dee, MD  TRESIBA FLEXTOUCH 100 UNIT/ML FlexTouch Pen Inject 18 Units into the skin at bedtime. 09/29/19   [provider]  TRULICITY 1.5 AL/9.3XT SOPN Inject 1.5 mg into the skin every Sunday. 05/07/21   [provider]  valsartan (DIOVAN) 160 MG tablet Take 1 tablet (160 mg total) by mouth daily. 08/20/21   Dwyane Dee, MD  venlafaxine XR (EFFEXOR-XR) 150 MG 24 hr capsule Take 150 mg by mouth 2 (two) times daily.    [provider]  zaleplon (SONATA) 5 MG capsule Take 5 mg by  mouth at bedtime.    [provider]      Allergies    Aspirin, Codeine, Naproxen, and Sulfonamide derivatives    Review of Systems   Review of Systems  Musculoskeletal:  Positive for arthralgias and joint swelling.  All other systems reviewed and are negative.   Physical Exam Updated Vital Signs BP (!) 187/61 (BP Location: Right Arm)   Pulse 65   Temp 98.4 F (36.9 C) (Oral)   Resp 18   Ht '5\' 4"'$  (1.626 m)   Wt 86.2 kg   SpO2 91%   BMI 32.61 kg/m  Physical Exam Vitals and nursing note reviewed.  Constitutional:      General: She is not in acute distress.    Appearance: Normal appearance. She is well-developed. She is obese. She is not ill-appearing, toxic-appearing or diaphoretic.  HENT:     Head: Normocephalic and atraumatic.  Eyes:     Extraocular Movements: Extraocular movements intact.     Conjunctiva/sclera: Conjunctivae normal.     Pupils: Pupils are equal, round, and reactive to light.     Comments: No nystagmus  Cardiovascular:     Rate and Rhythm: Normal rate and regular rhythm.     Heart sounds: No murmur heard. Pulmonary:     Effort: Pulmonary effort is normal. No respiratory distress.     Breath sounds: Normal breath sounds. No stridor. No wheezing, rhonchi or rales.  Abdominal:     Palpations: Abdomen is soft.     Tenderness: There is no abdominal tenderness.  Musculoskeletal:        General: Swelling (mild, L knee) present.     Cervical back: Neck supple.  Skin:    General: Skin is warm and dry.     Capillary Refill: Capillary refill takes less than 2 seconds.     Findings: Lesion (small abrasion to left temple) present.  Neurological:     General: No focal deficit present.     Mental Status: She is alert and oriented to person, place, and time.     Cranial Nerves: No cranial nerve deficit.  Psychiatric:        Mood and Affect: Mood normal.        Behavior: Behavior normal.        Thought Content: Thought content normal.     ED  Results / Procedures / Treatments   Labs (all labs ordered are listed, but only abnormal results are displayed) Labs Reviewed - No data to display  EKG None  Radiology CT Knee Left Wo Contrast  Result Date: 08/16/2022 CLINICAL DATA:  Knee trauma, occult fracture suspected, xray done EXAM: CT OF THE LEFT KNEE WITHOUT CONTRAST TECHNIQUE: Multidetector CT imaging of  the left knee was performed according to the standard protocol. Multiplanar CT image reconstructions were also generated. RADIATION DOSE REDUCTION: This exam was performed according to the departmental dose-optimization program which includes automated exposure control, adjustment of the mA and/or kV according to patient size and/or use of iterative reconstruction technique. COMPARISON:  None Available. FINDINGS: Bones/Joint/Cartilage Patchy osteopenia. There is no evidence of acute fracture. Alignment is normal. There is no significant joint few small patellar enthesophytes. Mild tricompartment osteoarthritis. Ligaments Suboptimally assessed by CT. Muscles and Tendons No acute myotendinous abnormality by CT. Soft tissues There is soft tissue swelling along the knee without focal fluid collection. IMPRESSION: No evidence of acute fracture or significant joint effusion. Soft tissue swelling along the knee.  No focal fluid collection. Mild tricompartment osteoarthritis. Electronically Signed   By: Maurine Simmering M.D.   On: 08/16/2022 16:53   CT Head Wo Contrast  Result Date: 08/16/2022 CLINICAL DATA:  Head trauma EXAM: CT HEAD WITHOUT CONTRAST TECHNIQUE: Contiguous axial images were obtained from the base of the skull through the vertex without intravenous contrast. RADIATION DOSE REDUCTION: This exam was performed according to the departmental dose-optimization program which includes automated exposure control, adjustment of the mA and/or kV according to patient size and/or use of iterative reconstruction technique. COMPARISON:  Head CT 08/18/2021  FINDINGS: Brain: No evidence of acute infarction, hemorrhage, hydrocephalus, extra-axial collection or mass lesion/mass effect. Vascular: Atherosclerotic calcifications are present within the cavernous internal carotid arteries. Skull: Normal. Negative for fracture or focal lesion. Sinuses/Orbits: No acute finding. Other: None. IMPRESSION: No acute intracranial process. Electronically Signed   By: Ronney Asters M.D.   On: 08/16/2022 16:44    Procedures Procedures    Medications Ordered in ED Medications - No data to display  ED Course/ Medical Decision Making/ A&P                           Medical Decision Making Amount and/or Complexity of Data Reviewed Radiology: ordered.  This patient presents to the ED for concern of fall, left knee pain, this involves an extensive number of treatment options, and is a complaint that carries with it a high risk of complications and morbidity.  The differential diagnosis includes fracture, dislocation, strain, sprain   Co morbidities that complicate the patient evaluation  type 2 diabetes, obesity, hyperlipidemia, anxiety, depression, hypertension, IBS, arthritis, fibromyalgia, left total hip placement, A-fib currently on Eliquis  My initial workup includes CT left knee, had  Additional history obtained from: Nursing notes from this visit.  I ordered imaging studies including CT head, CT left knee I independently visualized and interpreted imaging which showed no abnormalities of the left knee.  No intracranial abnormalities I agree with the radiologist interpretation  Afebrile, hemodynamically.  72 year old female presenting to the ED for evaluation of a fall that occurred earlier today.  Was sent by [facility] to rule out tibial plateau fracture by CT.  This was ordered in the ED and was normal.  No evidence of fracture.  CT head was also ordered due to age and anticoagulated status.  This was normal as well.  Patient is mentating as normal and  shows no signs of neurologic deficits.  She presented with a knee immobilizer in place.  Neurovascular status of the affected extremity was intact.  Range of motion decreased secondary to pain.  She ambulated in the ED with a walker without difficulty.  She states she has a walker at home.  She plans to follow-up with [facility] and call them on Monday for an appointment.  She will also be given the contact information for orthopedic provider on-call.  She was given strict return precautions.  Stable at discharge.  At this time there does not appear to be any evidence of an acute emergency medical condition and the patient appears stable for discharge with appropriate outpatient follow up. Diagnosis was discussed with patient who verbalizes understanding of care plan and is agreeable to discharge. I have discussed return precautions with patient and husband who verbalizes understanding. Patient encouraged to follow-up with their PCP within 1 week. All questions answered.  Patient's case discussed with Dr. Regenia Skeeter who agrees with plan to discharge with follow-up.   Note: Portions of this report may have been transcribed using voice recognition software. Every effort was made to ensure accuracy; however, inadvertent computerized transcription errors may still be present.         Final Clinical Impression(s) / ED Diagnoses Final diagnoses:  None    Rx / DC Orders ED Discharge Orders     None         Roylene Reason, Hershal Coria 08/16/22 1849    Sherwood Gambler, MD 08/18/22 [phone]

## 2022-08-16 NOTE — ED Notes (Signed)
Pt able to ambulate with a walker without assistance

## 2022-08-26 ENCOUNTER — Encounter: Payer: Self-pay | Admitting: Physician Assistant

## 2022-08-26 ENCOUNTER — Ambulatory Visit (INDEPENDENT_AMBULATORY_CARE_PROVIDER_SITE_OTHER): Payer: Medicare Other | Admitting: Physician Assistant

## 2022-08-26 VITALS — BP 130/82 | HR 68 | Ht 64.0 in

## 2022-08-26 DIAGNOSIS — K5904 Chronic idiopathic constipation: Secondary | ICD-10-CM | POA: Diagnosis not present

## 2022-08-26 DIAGNOSIS — R7989 Other specified abnormal findings of blood chemistry: Secondary | ICD-10-CM | POA: Diagnosis not present

## 2022-08-26 NOTE — Patient Instructions (Signed)
Check with cardiology about holding Amiodarone.   _______________________________________________________  If your blood pressure at your visit was 140/90 or greater, please contact your primary care physician to follow up on this.  _______________________________________________________  If you are age 71 or older, your body mass index should be between 23-30. Your Body mass index is 32.61 kg/m. If this is out of the aforementioned range listed, please consider follow up with your Primary Care Provider.  If you are age 23 or younger, your body mass index should be between 19-25. Your Body mass index is 32.61 kg/m. If this is out of the aformentioned range listed, please consider follow up with your Primary Care Provider.   ________________________________________________________  The [facility] providers would like to encourage you to use [facility] to communicate with providers for non-urgent requests or questions.  Due to long hold times on the telephone, sending your provider a message by [facility] may be a faster and more efficient way to get a response.  Please allow 48 business hours for a response.  Please remember that this is for non-urgent requests.  _______________________________________________________

## 2022-08-26 NOTE — Progress Notes (Signed)
____________________________________________________________  Attending physician addendum:  Thank you for sending this case to me. I have reviewed the entire note and also did extensive review of prior testing for elevated LFTs and 2012 and 2013 Most recent cardiology/A-fib clinic note from September 2023 indicates patient was on amiodarone at that time.  Still difficult to determine if the LFTs are from underlying fatty liver with possible steatohepatitis, amiodarone, and/or an underlying metabolic or autoimmune liver disease.  Please arrange the following labs: ANA, ASMA, AMA, alpha-1 antitrypsin level, hepatitis A total antibody, hepatitis B surface antigen, core antibody and surface antibody, hepatitis C antibody, iron levels, ceruloplasmin. With that information, we can then decide about the possible need for liver biopsy or referral to [facility] clinic to help decide if liver biopsy is needed.  Wilfrid Lund, MD  ____________________________________________________________  [place],  FYI on [facility] patient.

## 2022-08-26 NOTE — Progress Notes (Signed)
Chief Complaint: Follow-up fatty liver  HPI:    Alexis [name] is a 72 year old female, known to Dr. Loletha Carrow, with a past medical history as listed below including GERD, hiatal hernia, obesity and multiple others, who presents to clinic today for follow-up of her elevated LFTs and fatty liver.   08/18/2021 CTAP without contrast with liver slightly hyperdense.  Otherwise moderate amount of retained stool in the colon suggesting constipation, cardiomegaly and aortic atherosclerosis as well as nonobstructive right renal calculi.    05/15/2022 GGT 276 (05/01/2022 GGT 285) and 05/15/2022 alk phos 156 (11/26/2021 111), ALT 146 (88 on 11/26/2021), AST 206 (72 on 11/26/2021).  Total bilirubin normal.  CMP otherwise normal other than elevated creatinine at 1.19 and glucose 152.    06/05/2022 hepatic function panel with an AST of 64 (206 on 05/15/22), ALT 52 (146 on 05/15/2022).  Normal alk phos.    06/27/2022 patient seen in clinic for elevated LFTs.  She had been on antibiotic around that timeframe.  Since then they have come back down at all last check a couple of weeks prior.  Described a diagnosis of fatty liver in the 2000's.  Plan was for recheck of liver enzymes in 3 to 4 weeks.  Pending results repeat right upper quadrant ultrasound and other labs/imaging.  Dr. Loletha Carrow suggest the possibility relation to Amiodarone.    07/15/2022 alk phos 152, AST 69, ALT 41.  These were generally stable from a year prior.  Dr. Loletha Carrow recommended confirming if she was on Amiodarone or not.    Today, the patient presents to clinic and tells me that she feels the same.  She is not exactly sure if she is taking Amiodarone or not but it is on her list and she thinks she is probably taking it.  Does describe an episode of constipation, apparently will use Senokot as needed about every 3 to 4 days if needed which is maybe twice a month and this works well for her.  Does describe some lower abdominal cramping/pain before bowel movements  recently which is new for her, but it is relieved after she passes a stool.    Denies fever, chills, weight loss, blood in her stool or fatigue.  Past Medical History:  Diagnosis Date   Anxiety    Arthritis    "knees" (05/25/2014)   Chronic back pain    Depression    Diabetes mellitus type II    Diabetic peripheral neuropathy ([place]) 10/20/2018   Diverticulitis    Fatty liver    Fibromyalgia    Gastric polyp    hyperplastic   Gastroparesis    "recently dx'd" (05/25/2014)   GERD (gastroesophageal reflux disease)    H/O hiatal hernia    Hx of gastritis    Hyperlipidemia    Hypertension    Irritable bowel syndrome (IBS)    Memory difficulties 09/18/2017   Migraine without aura, without mention of intractable migraine without mention of status migrainosus    "related to allergies; have them in the spring and fall" (05/25/2014)   Obesity    Sleep apnea    "wore mask; took it off in my sleep; quit wearing it" (05/25/2014)   Uterine cancer ([place]) dx'd 2000   surg only   Vasculitis ([place])    "irritates my legs"   Wears glasses     Past Surgical History:  Procedure Laterality Date   ANTERIOR CERVICAL DECOMPRESSION/DISCECTOMY FUSION 4 LEVELS N/A 05/07/2018   Procedure: ANTERIOR CERVICAL DECOMPRESSION FUSION, CERVICAL 4-5,CERVICAL 5-6,  CERVICAL 6-7 WITH INSTRUMENTATION AND ALLOGRAFT;  Surgeon: Phylliss Bob, MD;  Location: [facility];  Service: Orthopedics;  Laterality: N/A;   APPENDECTOMY  ~ 1967   BIOPSY  07/27/2021   Procedure: BIOPSY;  Surgeon: Yetta Flock, MD;  Location: [place] [facility];  Service: Gastroenterology;;   BREAST CYST EXCISION Right 1990   COLONOSCOPY     COLONOSCOPY WITH PROPOFOL N/A 07/27/2021   Procedure: COLONOSCOPY WITH PROPOFOL;  Surgeon: Yetta Flock, MD;  Location: [facility];  Service: Gastroenterology;  Laterality: N/A;   ESOPHAGOGASTRODUODENOSCOPY (EGD) WITH PROPOFOL N/A 07/27/2021   Procedure: ESOPHAGOGASTRODUODENOSCOPY (EGD) WITH PROPOFOL;   Surgeon: Yetta Flock, MD;  Location: [facility];  Service: Gastroenterology;  Laterality: N/A;   HEMOSTASIS CLIP PLACEMENT  07/27/2021   Procedure: HEMOSTASIS CLIP PLACEMENT;  Surgeon: Yetta Flock, MD;  Location: [facility];  Service: Gastroenterology;;   [place] WITH CORONARY ANGIOGRAM N/A 05/26/2014   Procedure: LEFT HEART CATHETERIZATION WITH CORONARY ANGIOGRAM;  Surgeon: Troy Sine, MD;  Location: [facility] CATH LAB;  Service: Cardiovascular;  Laterality: N/A;   POLYPECTOMY  07/27/2021   Procedure: POLYPECTOMY;  Surgeon: Yetta Flock, MD;  Location: [facility];  Service: Gastroenterology;;   Clide Deutscher  07/27/2021   Procedure: Clide Deutscher;  Surgeon: Yetta Flock, MD;  Location: [facility] ENDOSCOPY;  Service: Gastroenterology;;   SINUS SURGERY WITH INSTATRAK  2000   TOTAL HIP ARTHROPLASTY Left 12/05/2020   Procedure: TOTAL HIP ARTHROPLASTY ANTERIOR APPROACH;  Surgeon: Rod Can, MD;  Location: [facility];  Service: Orthopedics;  Laterality: Left;   TUBAL LIGATION  ~ 1982   VAGINAL HYSTERECTOMY  2000    Current Outpatient Medications  Medication Sig Dispense Refill   acetaminophen (TYLENOL) 325 MG tablet Take 2 tablets (650 mg total) by mouth every 6 (six) hours as needed for mild pain or moderate pain. (Patient taking differently: Take 325 mg by mouth every 6 (six) hours as needed for mild pain, moderate pain or headache.)     ACIPHEX 20 MG tablet Take 20 mg by mouth in the morning and at bedtime.     ALPRAZolam (XANAX) 0.5 MG tablet Take 0.5 mg by mouth at bedtime as needed.     amiodarone (PACERONE) 200 MG tablet Take 0.5 tablets (100 mg total) by mouth daily. 45 tablet 3   apixaban (ELIQUIS) 5 MG TABS tablet Take 1 tablet (5 mg total) by mouth 2 (two) times daily. 60 tablet    b complex vitamins tablet Take 1 tablet by mouth at bedtime.     CALCIUM PO Take 1 tablet by mouth at bedtime.      FARXIGA 10 MG TABS tablet Take 10 mg by mouth in the morning.     fish oil-omega-3 fatty acids 1000 MG capsule Take 1 g by mouth at bedtime.      furosemide (LASIX) 20 MG tablet TAKE 1 TABLET BY MOUTH EVERY DAY 90 tablet 3   insulin lispro (HUMALOG) 100 UNIT/ML KwikPen Inject 8 Units into the skin 3 (three) times daily.     metoprolol succinate (TOPROL-XL) 50 MG 24 hr tablet Take 1 tablet (50 mg total) by mouth daily. Take with or immediately following a meal. (Patient taking differently: Take 100 mg by mouth in the morning and at bedtime. Take with or immediately following a meal.)     pantoprazole (PROTONIX) 40 MG tablet Take 1 tablet (40 mg total) by mouth 2 (two) times daily. (Patient not taking: Reported on  06/27/2022) 60 tablet 2   pregabalin (LYRICA) 50 MG capsule Take 50 mg by mouth 2 (two) times daily.     spironolactone (ALDACTONE) 25 MG tablet Take 0.5 tablets (12.5 mg total) by mouth daily. (Patient not taking: Reported on 06/27/2022) 30 tablet 2   TRESIBA FLEXTOUCH 100 UNIT/ML FlexTouch Pen Inject 18 Units into the skin at bedtime.     TRULICITY 1.5 OZ/3.6UY SOPN Inject 1.5 mg into the skin every Sunday.     valsartan (DIOVAN) 160 MG tablet Take 1 tablet (160 mg total) by mouth daily. 30 tablet 3   venlafaxine XR (EFFEXOR-XR) 150 MG 24 hr capsule Take 150 mg by mouth 2 (two) times daily.     zaleplon (SONATA) 5 MG capsule Take 5 mg by mouth at bedtime.     No current facility-administered medications for this visit.    Allergies as of 08/26/2022 - Review Complete 08/16/2022  Allergen Reaction Noted   Aspirin Other (See Comments) 02/09/2014   Codeine Itching and Rash 08/24/2010   Naproxen Nausea Only 02/09/2014   Sulfonamide derivatives Itching and Rash 08/24/2010    Family History  Problem Relation Age of Onset   Colon polyps Father    Heart disease Father    Breast cancer Sister    COPD Brother    Irritable bowel syndrome Daughter    Ovarian cancer Maternal Aunt     Stomach cancer Maternal Aunt    Diabetes Maternal Aunt    Colon cancer Maternal Uncle    Heart disease Maternal Uncle    Heart disease Other        Grandparents   Esophageal cancer Neg Hx    Pancreatic cancer Neg Hx     Social History   Socioeconomic History   Marital status: Married    Spouse name: Anamaria Dusenbury   Number of children: 2   Years of education: Not on file   Highest education level: Some college, no degree  Occupational History   Occupation: Retired    Fish farm manager: RETIRED  Tobacco Use   Smoking status: Never   Smokeless tobacco: Never  Vaping Use   Vaping Use: Never used  Substance and Sexual Activity   Alcohol use: No   Drug use: No   Sexual activity: Not Currently  Other Topics Concern   Not on file  Social History Narrative   Daily Caffeine, Coke   Social Determinants of Health   Financial Resource Strain: Low Risk  (04/27/2021)   Overall Financial Resource Strain (CARDIA)    Difficulty of Paying Living Expenses: Not very hard  Food Insecurity: No Food Insecurity (04/27/2021)   Hunger Vital Sign    Worried About Running Out of Food in the Last Year: Never true    [place] in the Last Year: Never true  Transportation Needs: No Transportation Needs (04/27/2021)   PRAPARE - Hydrologist (Medical): No    Lack of Transportation (Non-Medical): No  Physical Activity: Not on file  Stress: Not on file  Social Connections: Not on file  Intimate Partner Violence: Not on file    Review of Systems:    Constitutional: No weight loss, fever or chills Cardiovascular: No chest pain Respiratory: No SOB Gastrointestinal: See HPI and otherwise negative   Physical Exam:  Vital signs: BP 130/82   Pulse 68   Ht '5\' 4"'$  (1.626 m)   BMI 32.61 kg/m    Constitutional:   Pleasant Caucasian female appears to be  in NAD, Well developed, Well nourished, alert and cooperative Respiratory: Respirations even and unlabored. Lungs clear to  auscultation bilaterally.   No wheezes, crackles, or rhonchi.  Cardiovascular: Normal S1, S2. No MRG. Regular rate and rhythm. No peripheral edema, cyanosis or pallor.  Gastrointestinal:  Soft, nondistended, nontender. No rebound or guarding. Normal bowel sounds. No appreciable masses or hepatomegaly. Rectal:  Not performed.  Msk:  Symmetrical without gross deformities. Without edema, no deformity or joint abnormality. +in wheelchair Psychiatric:Demonstrates good judgement and reason without abnormal affect or behaviors.     Latest Ref Rng & Units 07/15/2022    1:58 PM 06/05/2022    3:09 AM 08/18/2021   10:50 AM  Hepatic Function  Total Protein 6.0 - 8.3 g/dL 6.9  6.9  6.8   Albumin 3.5 - 5.2 g/dL 3.3  2.9  3.2   AST 0 - 37 U/L 69  64  47   ALT 0 - 35 U/L 41  52  57   Alk Phosphatase 39 - 117 U/L 152  121  164   Total Bilirubin 0.2 - 1.2 mg/dL 0.6  0.8  0.3   Bilirubin, Direct 0.0 - 0.3 mg/dL 0.2  0.1      Assessment: 1.  Elevated LFTs: Remaining elevated over the past 2 months, known fatty liver, previous workup by Dr. Maurene Capes in 2013, last CT in January 2023 with fatty liver; consider DILI related to Amiodarone versus other 2.  Constipation: Relieved with as needed Senokot  Plan: 1.  Discussed with patient that she needs to see if she is on Amiodarone or not.  She does have follow-up with her cardiologist on 1/18.  Explained that if she is on Amiodarone I would like her to discuss stopping this with her cardiologist and rechecking her LFTs in 1 to 2 weeks after stopping this medicine to see if this helps her liver enzymes.  If it does not then would recommend additional workup.  Also if she is not currently on the medicine then would recommend additional workup. 2.  Patient to follow in clinic as above.  She will call and let me know when she stops the medicine or if she is taking it or not.  Ellouise Newer, PA-C [facility] 08/26/2022, 1:40 PM  Cc: Holland Commons, FNP

## 2022-08-27 NOTE — Progress Notes (Signed)
Called and spoke with patient regarding Dr. Loletha Carrow' recommendations regarding additional lab work at this time. Pt has been advised to stop by the lab in the basement at her convenience to have labs drawn. Pt verbalized understanding and had no concerns at the end of the call.   Lab orders in epic.

## 2022-08-27 NOTE — Addendum Note (Signed)
Addended by: Yevette Edwards on: 08/27/2022 08:57 AM   Modules accepted: Orders

## 2022-08-29 ENCOUNTER — Encounter: Payer: Self-pay | Admitting: Internal Medicine

## 2022-08-29 ENCOUNTER — Ambulatory Visit: Payer: Medicare Other | Attending: Internal Medicine | Admitting: Internal Medicine

## 2022-08-29 VITALS — BP 156/60 | HR 60 | Ht 64.0 in

## 2022-08-29 DIAGNOSIS — I5031 Acute diastolic (congestive) heart failure: Secondary | ICD-10-CM

## 2022-08-29 DIAGNOSIS — E7800 Pure hypercholesterolemia, unspecified: Secondary | ICD-10-CM

## 2022-08-29 DIAGNOSIS — Z79899 Other long term (current) drug therapy: Secondary | ICD-10-CM | POA: Diagnosis not present

## 2022-08-29 DIAGNOSIS — I480 Paroxysmal atrial fibrillation: Secondary | ICD-10-CM | POA: Diagnosis not present

## 2022-08-29 DIAGNOSIS — K760 Fatty (change of) liver, not elsewhere classified: Secondary | ICD-10-CM

## 2022-08-29 MED ORDER — APIXABAN 5 MG PO TABS: 5.0000 mg | ORAL_TABLET | Freq: Two times a day (BID) | ORAL | 1 refills | Status: DC

## 2022-08-29 MED ORDER — ATORVASTATIN CALCIUM 20 MG PO TABS: 20.0000 mg | ORAL_TABLET | Freq: Every day | ORAL | 3 refills | Status: DC

## 2022-08-29 MED ORDER — FUROSEMIDE 20 MG PO TABS: 40.0000 mg | ORAL_TABLET | Freq: Every day | ORAL | 3 refills | Status: DC

## 2022-08-29 MED ORDER — AMLODIPINE BESYLATE 5 MG PO TABS: 5.0000 mg | ORAL_TABLET | Freq: Every day | ORAL | 3 refills | Status: DC

## 2022-08-29 MED ORDER — SPIRONOLACTONE 25 MG PO TABS: 12.5000 mg | ORAL_TABLET | Freq: Every day | ORAL | 1 refills | Status: DC

## 2022-08-29 NOTE — Patient Instructions (Addendum)
Medication Instructions:  RESUME atorvastatin '20mg'$  daily RESUME spironolactone 12.'5mg'$  daily (half of '25mg'$  tablet) RESUME eliquis twice daily RESUME amlodipine '5mg'$  daily INCREASE furosemide to '40mg'$  daily OK to stop amiodarone  *If you need a refill on your cardiac medications before your next appointment, please call your pharmacy*   Lab Work: FASTING lab work to check cholesterol and metabolic panel before your next visit in about 3 months  If you have labs (blood work) drawn today and your tests are completely normal, you will receive your results only by: [place] (if you have MyChart) OR A paper copy in the mail If you have any lab test that is abnormal or we need to change your treatment, we will call you to review the results.   Follow-Up: At [facility], you and your health needs are our priority.  As part of our continuing mission to provide you with exceptional heart care, we have created designated Provider Care Teams.  These Care Teams include your primary Cardiologist (physician) and Advanced Practice Providers (APPs -  Physician Assistants and Nurse Practitioners) who all work together to provide you with the care you need, when you need it.  We recommend signing up for the patient portal called "MyChart".  Sign up information is provided on this After Visit Summary.  MyChart is used to connect with patients for Virtual Visits (Telemedicine).  Patients are able to view lab/test results, encounter notes, upcoming appointments, etc.  Non-urgent messages can be sent to your provider as well.   To learn more about what you can do with MyChart, go to [email or web address].    Your next appointment:    3 months with Coletta Memos NP
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
## 2022-08-30 ENCOUNTER — Other Ambulatory Visit: Payer: Self-pay | Admitting: Internal Medicine

## 2022-09-05 ENCOUNTER — Telehealth: Payer: Self-pay | Admitting: Physician Assistant

## 2022-09-05 NOTE — Telephone Encounter (Signed)
Patient called wanted to follow up with Ellouise Newer PA-C, said she saw her cardiologist and they took her off Amiodarone medication and will also get lab work up within two months.

## 2022-09-05 NOTE — Telephone Encounter (Signed)
Alexis [name]. Cardiology note in epic. Thanks

## 2022-10-09 ENCOUNTER — Other Ambulatory Visit: Payer: Self-pay | Admitting: Internal Medicine

## 2022-10-11 ENCOUNTER — Telehealth: Payer: Self-pay

## 2022-10-11 ENCOUNTER — Other Ambulatory Visit: Payer: Medicare Other

## 2022-10-11 ENCOUNTER — Ambulatory Visit (INDEPENDENT_AMBULATORY_CARE_PROVIDER_SITE_OTHER): Payer: Medicare Other | Admitting: Gastroenterology

## 2022-10-11 ENCOUNTER — Encounter: Payer: Self-pay | Admitting: Gastroenterology

## 2022-10-11 VITALS — BP 126/70 | HR 60 | Ht 64.0 in | Wt 179.2 lb

## 2022-10-11 DIAGNOSIS — R197 Diarrhea, unspecified: Secondary | ICD-10-CM

## 2022-10-11 NOTE — Progress Notes (Signed)
10/11/2022 Alexis [name] [id number] April 14, 1951   HISTORY OF PRESENT ILLNESS:  This is a 72 year old female who is a patient of Dr. Loletha Carrow.  She is here today with complaints of acute onset of diarrhea about 9 days ago now.  Prior to that she tended to be constipated.  Also reporting associated mid abdominal pains, intermittent low-grade fever, headache.  Says that she is having diarrhea with urgency multiple times a day, having accidents/incontinence and needing to wear adult diapers.  Says that she was negative for COVID.  No new medications, no recent antibiotics, no unusual travel, no known sick contacts, no recent change in diet.  She is drinking electrolyte drinks to stay hydrated.  Labs from PCP on 2/28:  CBC and BMP okay with just a slightly low potassium.  TSH normal.  Past Medical History:  Diagnosis Date   Anxiety    Arthritis    "knees" (05/25/2014)   Chronic back pain    Depression    Diabetes mellitus type II    Diabetic peripheral neuropathy ([place]) 10/20/2018   Diverticulitis    Fatty liver    Fibromyalgia    Gastric polyp    hyperplastic   Gastroparesis    "recently dx'd" (05/25/2014)   GERD (gastroesophageal reflux disease)    H/O hiatal hernia    Hx of gastritis    Hyperlipidemia    Hypertension    Irritable bowel syndrome (IBS)    Memory difficulties 09/18/2017   Migraine without aura, without mention of intractable migraine without mention of status migrainosus    "related to allergies; have them in the spring and fall" (05/25/2014)   Obesity    Sleep apnea    "wore mask; took it off in my sleep; quit wearing it" (05/25/2014)   Uterine cancer ([place]) dx'd 2000   surg only   Vasculitis ([place])    "irritates my legs"   Wears glasses    Past Surgical History:  Procedure Laterality Date   ANTERIOR CERVICAL DECOMPRESSION/DISCECTOMY FUSION 4 LEVELS N/A 05/07/2018   Procedure: ANTERIOR CERVICAL DECOMPRESSION FUSION, CERVICAL 4-5,CERVICAL 5-6, CERVICAL 6-7  WITH INSTRUMENTATION AND ALLOGRAFT;  Surgeon: Phylliss Bob, MD;  Location: [place];  Service: Orthopedics;  Laterality: N/A;   APPENDECTOMY  ~ 1967   BIOPSY  07/27/2021   Procedure: BIOPSY;  Surgeon: Yetta Flock, MD;  Location: [place];  Service: Gastroenterology;;   BREAST CYST EXCISION Right 1990   COLONOSCOPY     COLONOSCOPY WITH PROPOFOL N/A 07/27/2021   Procedure: COLONOSCOPY WITH PROPOFOL;  Surgeon: Yetta Flock, MD;  Location: [place];  Service: Gastroenterology;  Laterality: N/A;   ESOPHAGOGASTRODUODENOSCOPY (EGD) WITH PROPOFOL N/A 07/27/2021   Procedure: ESOPHAGOGASTRODUODENOSCOPY (EGD) WITH PROPOFOL;  Surgeon: Yetta Flock, MD;  Location: [place];  Service: Gastroenterology;  Laterality: N/A;   HEMOSTASIS CLIP PLACEMENT  07/27/2021   Procedure: HEMOSTASIS CLIP PLACEMENT;  Surgeon: Yetta Flock, MD;  Location: [facility];  Service: Gastroenterology;;   [place] WITH CORONARY ANGIOGRAM N/A 05/26/2014   Procedure: LEFT HEART CATHETERIZATION WITH CORONARY ANGIOGRAM;  Surgeon: Troy Sine, MD;  [facility];  Service: Cardiovascular;  Laterality: N/A;   POLYPECTOMY  07/27/2021   Procedure: POLYPECTOMY;  Surgeon: Yetta Flock, MD;  Location: [place];  Service: Gastroenterology;;   Clide Deutscher  07/27/2021   Procedure: Clide Deutscher;  Surgeon: Yetta Flock, MD;  Location: [facility];  Service: Gastroenterology;;   SINUS  SURGERY WITH INSTATRAK  2000   TOTAL HIP ARTHROPLASTY Left 12/05/2020   Procedure: TOTAL HIP ARTHROPLASTY ANTERIOR APPROACH;  Surgeon: Rod Can, MD;  Location: [place];  Service: Orthopedics;  Laterality: Left;   TUBAL LIGATION  ~ [place]  2000    reports that she has never smoked. She has never used smokeless tobacco. She reports that she does not drink alcohol and does not use drugs. family history includes  Breast cancer in her sister; COPD in her brother; Colon cancer in her maternal uncle; Colon polyps in her father; Diabetes in her maternal aunt; Heart disease in her father, maternal uncle, and another family member; Irritable bowel syndrome in her daughter; Ovarian cancer in her maternal aunt; Stomach cancer in her maternal aunt. Allergies  Allergen Reactions   Aspirin Other (See Comments)    Reaction: Vasculitis per MD   Codeine Itching and Rash    Tolerated Norco 12/07/20 with no reports of rash/itching. Patient endorsed allergy >30 yr ago and hasn't had reaction since. Climb the walls per pt   Naproxen Nausea Only    Reaction: Makes stomach upset/irritated.   Sulfonamide Derivatives Itching and Rash      Outpatient Encounter Medications as of 10/11/2022  Medication Sig   acetaminophen (TYLENOL) 325 MG tablet Take 2 tablets (650 mg total) by mouth every 6 (six) hours as needed for mild pain or moderate pain. (Patient taking differently: Take 325 mg by mouth every 6 (six) hours as needed for mild pain, moderate pain or headache.)   ACIPHEX 20 MG tablet Take 20 mg by mouth in the morning and at bedtime.   ALPRAZolam (XANAX) 0.5 MG tablet Take 0.5 mg by mouth at bedtime as needed.   amLODipine (NORVASC) 5 MG tablet Take 1 tablet (5 mg total) by mouth daily.   apixaban (ELIQUIS) 5 MG TABS tablet Take 1 tablet (5 mg total) by mouth 2 (two) times daily.   atorvastatin (LIPITOR) 20 MG tablet Take 1 tablet (20 mg total) by mouth daily.   b complex vitamins tablet Take 1 tablet by mouth at bedtime.   CALCIUM PO Take 1 tablet by mouth at bedtime.   FARXIGA 10 MG TABS tablet Take 10 mg by mouth in the morning.   fish oil-omega-3 fatty acids 1000 MG capsule Take 1 g by mouth at bedtime.    furosemide (LASIX) 20 MG tablet Take 2 tablets (40 mg total) by mouth daily.   insulin lispro (HUMALOG) 100 UNIT/ML KwikPen Inject 8 Units into the skin 3 (three) times daily.   metoprolol succinate (TOPROL-XL) 50  MG 24 hr tablet Take 1 tablet (50 mg total) by mouth daily. Take with or immediately following a meal. (Patient taking differently: Take 100 mg by mouth in the morning and at bedtime. Take with or immediately following a meal.)   pantoprazole (PROTONIX) 40 MG tablet Take 1 tablet (40 mg total) by mouth 2 (two) times daily.   pregabalin (LYRICA) 50 MG capsule Take 50 mg by mouth 2 (two) times daily.   spironolactone (ALDACTONE) 25 MG tablet Take 0.5 tablets (12.5 mg total) by mouth daily.   TRESIBA FLEXTOUCH 100 UNIT/ML FlexTouch Pen Inject 18 Units into the skin at bedtime.   TRULICITY 1.5 0000000 SOPN Inject 1.5 mg into the skin every Sunday.   valsartan (DIOVAN) 160 MG tablet Take 1 tablet (160 mg total) by mouth daily.   venlafaxine XR (EFFEXOR-XR) 150 MG 24 hr capsule Take 150 mg by mouth  2 (two) times daily.   zaleplon (SONATA) 5 MG capsule Take 5 mg by mouth at bedtime.   No facility-administered encounter medications on file as of 10/11/2022.     REVIEW OF SYSTEMS  : All other systems reviewed and negative except where noted in the History of Present Illness.   PHYSICAL EXAM: BP (!) 142/70 (BP Location: Left Arm, Patient Position: Sitting, Cuff Size: Normal)   Pulse 60   Ht '5\' 4"'$  (1.626 m)   Wt 179 lb 4 oz (81.3 kg)   BMI 30.77 kg/m  General: Well developed white female in no acute distress Head: Normocephalic and atraumatic Eyes:  Sclerae anicteric, conjunctiva pink. Ears: Normal auditory acuity Lungs: Clear throughout to auscultation; no W/R/R. Heart: Regular rate and rhythm; no M/R/G. Abdomen: Soft, non-distended.  BS present.  Mild diffuse TTP. Musculoskeletal: Symmetrical with no gross deformities  Skin: No lesions on visible extremities Extremities: No edema  Neurological: Alert oriented x 4, grossly non-focal Psychological:  Alert and cooperative. Normal mood and affect  ASSESSMENT AND PLAN: *Diarrhea: Acute onset about 9 days ago now.  Prior to that she tended to  be constipated.  Also reporting associated mid abdominal pains, intermittent low-grade fever, headache.  Says that she was negative for COVID.  No new medications, no recent antibiotics, no unusual travel, no known sick contacts, no recent change in diet.  Will check a stool GI pathogen panel.  She is drinking electrolyte replacements to keep hydrated.  Advised on a bland diet.  Pending results of stool studies and symptoms question if she will need some type of imaging, x-ray versus CT scan.  Recent CBC and BMP okay with just a slightly low potassium.  TSH normal.   CC:  Prevost, Leonia Reader, FNP

## 2022-10-11 NOTE — Telephone Encounter (Signed)
Attempted to reach patient's PCP's office [facility]). They close at 12 pm on Friday's. I will contact PCP's office next week.

## 2022-10-11 NOTE — Telephone Encounter (Signed)
-----   Message from Levin Erp, [place] sent at 10/11/2022  1:56 PM EST ----- Regarding: Can you figure out where she had labs and can we get them-thx-me  ----- Message ----- From: Loralie Champagne, PA-C Sent: 10/11/2022  10:42 AM EST To: Levin Erp, PA  This is the patient that I saw today for diarrhea and you saw her about a month ago for elevated LFTs.  She says she had the labs drawn elsewhere, her PCP's office.  Not sure if you had seen those come across at all or not, but I did not see anything in the chart.  Jess

## 2022-10-11 NOTE — Patient Instructions (Addendum)
Your provider has requested that you go to the basement level for lab work before leaving today. Press "B" on the elevator. The lab is located at the first door on the left as you exit the elevator.  Start a bland diet.  _______________________________________________________  If your blood pressure at your visit was 140/90 or greater, please contact your primary care physician to follow up on this.  _______________________________________________________  If you are age 16 or older, your body mass index should be between 23-30. Your Body mass index is 30.77 kg/m. If this is out of the aforementioned range listed, please consider follow up with your Primary Care Provider.  If you are age 27 or younger, your body mass index should be between 19-25. Your Body mass index is 30.77 kg/m. If this is out of the aformentioned range listed, please consider follow up with your Primary Care Provider.   ________________________________________________________  The [facility] providers would like to encourage you to use [facility] to communicate with providers for non-urgent requests or questions.  Due to long hold times on the telephone, sending your provider a message by [facility] may be a faster and more efficient way to get a response.  Please allow 48 business hours for a response.  Please remember that this is for non-urgent requests.  _______________________________________________________

## 2022-10-14 NOTE — Progress Notes (Signed)
____________________________________________________________  Attending physician addendum:  Thank you for sending this case to me. I have reviewed the entire note and agree with the plan.  Agree it is most likely protracted symptoms of infectious diarrhea.  Wilfrid Lund, MD  ____________________________________________________________

## 2022-10-15 LAB — GI PROFILE, STOOL, PCR

## 2022-10-15 NOTE — Telephone Encounter (Signed)
Left a detailed vm for Alexis [name] in Medical Records to fax over any labs that have been collected for patient this year. I asked that she fax results to my attention at [phone]. I provided Alexis [name] with my direct office # in case she has any questions.

## 2022-10-17 ENCOUNTER — Other Ambulatory Visit: Payer: Self-pay | Admitting: Physician Assistant

## 2022-10-17 NOTE — Progress Notes (Signed)
10/17/2022 3:42 PM  Received recent labs from PCP completed 10/09/2022 with a CMP showing an alk phos elevated 241 (152 on 07/15/2022), ALT 56 (69 on 07/15/2022) and AST elevated at 67 (41 on 07/15/2022).  Potassium minimally decreased at 3.3 and sodium high at 146, amylase and lipase normal.  CBC normal.  It does not appear the patient is coming for any of other labs that were ordered.  I will have our nursing staff call the patient and come in for additional liver serologies.  It appears everything has continued to increase with time.  Will also recheck hepatic function panel at that time.  Ellouise Newer, PA-C

## 2022-10-17 NOTE — Telephone Encounter (Signed)
Lab results received from PCP. Placing in your office for your review. Thanks

## 2022-10-24 ENCOUNTER — Telehealth: Payer: Self-pay

## 2022-10-24 DIAGNOSIS — K5904 Chronic idiopathic constipation: Secondary | ICD-10-CM

## 2022-10-24 DIAGNOSIS — R7989 Other specified abnormal findings of blood chemistry: Secondary | ICD-10-CM

## 2022-10-24 NOTE — Telephone Encounter (Signed)
-----   Message from Levin Erp, [place] sent at 10/17/2022  3:45 PM EST ----- Regarding: lfts Please call patient, I reviewed her recent labs and her liver enzymes have continued to increase.  We had recommended some additional liver serologies at that time which she does not like she ever had done.  Can you please have her come in for these labs.  Also need repeat LFTs if that is not part of it.  Thanks, JL L

## 2022-10-24 NOTE — Telephone Encounter (Signed)
MyChart message sent to patient with recommendations.  

## 2022-10-28 NOTE — Telephone Encounter (Signed)
Patient reviewed and responded to MyChart message.

## 2022-10-31 ENCOUNTER — Other Ambulatory Visit (INDEPENDENT_AMBULATORY_CARE_PROVIDER_SITE_OTHER): Payer: Medicare Other

## 2022-10-31 DIAGNOSIS — R7989 Other specified abnormal findings of blood chemistry: Secondary | ICD-10-CM

## 2022-10-31 DIAGNOSIS — K5904 Chronic idiopathic constipation: Secondary | ICD-10-CM

## 2022-10-31 LAB — HEPATIC FUNCTION PANEL
ALT: 24 U/L (ref 0–35)
AST: 31 U/L (ref 0–37)
Albumin: 3.4 g/dL — ABNORMAL LOW (ref 3.5–5.2)
Alkaline Phosphatase: 155 U/L — ABNORMAL HIGH (ref 39–117)
Bilirubin, Direct: 0.1 mg/dL (ref 0.0–0.3)
Total Bilirubin: 0.4 mg/dL (ref 0.2–1.2)
Total Protein: 7.2 g/dL (ref 6.0–8.3)

## 2022-10-31 LAB — IBC + FERRITIN
Ferritin: 35.4 ng/mL (ref 10.0–291.0)
Iron: 60 ug/dL (ref 42–145)
Saturation Ratios: 13.2 % — ABNORMAL LOW (ref 20.0–50.0)
TIBC: 455 ug/dL — ABNORMAL HIGH (ref 250.0–450.0)
Transferrin: 325 mg/dL (ref 212.0–360.0)

## 2022-11-01 ENCOUNTER — Other Ambulatory Visit: Payer: Self-pay

## 2022-11-01 DIAGNOSIS — D649 Anemia, unspecified: Secondary | ICD-10-CM

## 2022-11-01 MED ORDER — FERROUS SULFATE 325 (65 FE) MG PO TABS: 325.0000 mg | ORAL_TABLET | ORAL | 3 refills | Status: AC

## 2022-11-01 NOTE — Addendum Note (Signed)
Addended by: Yevette Edwards on: 11/01/2022 03:58 PM   Modules accepted: Orders

## 2022-11-03 LAB — ANTI-SMOOTH MUSCLE ANTIBODY, IGG: Actin (Smooth Muscle) Antibody (IGG): <20 U (ref <20)

## 2022-11-03 LAB — HEPATITIS B SURFACE ANTIBODY,QUALITATIVE: Hep B S Ab: NONREACTIVE

## 2022-11-03 LAB — CERULOPLASMIN: Ceruloplasmin: 31 mg/dL (ref 18–53)

## 2022-11-03 LAB — ANA: Anti Nuclear Antibody (ANA): NEGATIVE

## 2022-11-03 LAB — HEPATITIS B SURFACE ANTIGEN: Hepatitis B Surface Ag: NONREACTIVE

## 2022-11-03 LAB — ALPHA-1-ANTITRYPSIN: A-1 Antitrypsin, Ser: 139 mg/dL (ref 83–199)

## 2022-11-03 LAB — HEPATITIS B CORE ANTIBODY, TOTAL: Hep B Core Total Ab: NONREACTIVE

## 2022-11-03 LAB — HEPATITIS A ANTIBODY, TOTAL: Hepatitis A AB,Total: NONREACTIVE

## 2022-11-03 LAB — HEPATITIS C ANTIBODY: Hepatitis C Ab: NONREACTIVE

## 2022-11-03 LAB — MITOCHONDRIAL ANTIBODIES: Mitochondrial M2 Ab, IgG: <=20 U (ref <=20.0)

## 2022-11-11 ENCOUNTER — Other Ambulatory Visit: Payer: Self-pay

## 2022-11-11 DIAGNOSIS — R7989 Other specified abnormal findings of blood chemistry: Secondary | ICD-10-CM

## 2022-11-14 LAB — COMPREHENSIVE METABOLIC PANEL
ALT: 21 IU/L (ref 0–32)
AST: 26 IU/L (ref 0–40)
Albumin/Globulin Ratio: 1.3 (ref 1.2–2.2)
Albumin: 3.5 g/dL — ABNORMAL LOW (ref 3.8–4.8)
Alkaline Phosphatase: 151 IU/L — ABNORMAL HIGH (ref 44–121)
BUN/Creatinine Ratio: 23 (ref 12–28)
BUN: 16 mg/dL (ref 8–27)
Bilirubin Total: 0.5 mg/dL (ref 0.0–1.2)
CO2: 26 mmol/L (ref 20–29)
Calcium: 9.1 mg/dL (ref 8.7–10.3)
Chloride: 100 mmol/L (ref 96–106)
Creatinine, Ser: 0.71 mg/dL (ref 0.57–1.00)
Globulin, Total: 2.8 g/dL (ref 1.5–4.5)
Glucose: 211 mg/dL — ABNORMAL HIGH (ref 70–99)
Potassium: 4.1 mmol/L (ref 3.5–5.2)
Sodium: 141 mmol/L (ref 134–144)
Total Protein: 6.3 g/dL (ref 6.0–8.5)
eGFR: 91 mL/min/{1.73_m2} (ref >59)

## 2022-11-14 LAB — LIPID PANEL
Chol/HDL Ratio: 2.5 ratio (ref 0.0–4.4)
Cholesterol, Total: 162 mg/dL (ref 100–199)
HDL: 66 mg/dL (ref >39)
LDL Chol Calc (NIH): 74 mg/dL (ref 0–99)
Triglycerides: 130 mg/dL (ref 0–149)
VLDL Cholesterol Cal: 22 mg/dL (ref 5–40)

## 2022-11-27 NOTE — Progress Notes (Deleted)
Cardiology Clinic Note   Patient Name: Alexis [name] Date of Encounter: 11/27/2022  Primary Care Provider:  Fatima Sanger, FNP Primary Cardiologist:  Chrystie Nose, MD  Patient Profile    Alexis [name] 72 year old female presents the clinic today for follow-up evaluation of her acute on chronic diastolic CHF.  Past Medical History    Past Medical History:  Diagnosis Date   Anxiety    Arthritis    "knees" (05/25/2014)   Chronic back pain    Depression    Diabetes mellitus type II    Diabetic peripheral neuropathy (HCC) 10/20/2018   Diverticulitis    Fatty liver    Fibromyalgia    Gastric polyp    hyperplastic   Gastroparesis    "recently dx'd" (05/25/2014)   GERD (gastroesophageal reflux disease)    H/O hiatal hernia    Hx of gastritis    Hyperlipidemia    Hypertension    Irritable bowel syndrome (IBS)    Memory difficulties 09/18/2017   Migraine without aura, without mention of intractable migraine without mention of status migrainosus    "related to allergies; have them in the spring and fall" (05/25/2014)   Obesity    Sleep apnea    "wore mask; took it off in my sleep; quit wearing it" (05/25/2014)   Uterine cancer (HCC) dx'd 2000   surg only   Vasculitis (HCC)    "irritates my legs"   Wears glasses    Past Surgical History:  Procedure Laterality Date   ANTERIOR CERVICAL DECOMPRESSION/DISCECTOMY FUSION 4 LEVELS N/A 05/07/2018   Procedure: ANTERIOR CERVICAL DECOMPRESSION FUSION, CERVICAL 4-5,CERVICAL 5-6, CERVICAL 6-7 WITH INSTRUMENTATION AND ALLOGRAFT;  Surgeon: Estill Bamberg, MD;  Location: [facility] OR;  Service: Orthopedics;  Laterality: N/A;   APPENDECTOMY  ~ 1967   BIOPSY  07/27/2021   Procedure: BIOPSY;  Surgeon: Benancio Deeds, MD;  Location: [facility] ENDOSCOPY;  Service: Gastroenterology;;   BREAST CYST EXCISION Right 1990   COLONOSCOPY     COLONOSCOPY WITH PROPOFOL N/A 07/27/2021   Procedure: COLONOSCOPY WITH PROPOFOL;  Surgeon:  Benancio Deeds, MD;  Location: [facility] ENDOSCOPY;  Service: Gastroenterology;  Laterality: N/A;   ESOPHAGOGASTRODUODENOSCOPY (EGD) WITH PROPOFOL N/A 07/27/2021   Procedure: ESOPHAGOGASTRODUODENOSCOPY (EGD) WITH PROPOFOL;  Surgeon: Benancio Deeds, MD;  Location: [facility] ENDOSCOPY;  Service: Gastroenterology;  Laterality: N/A;   HEMOSTASIS CLIP PLACEMENT  07/27/2021   Procedure: HEMOSTASIS CLIP PLACEMENT;  Surgeon: Benancio Deeds, MD;  Location: [facility] ENDOSCOPY;  Service: Gastroenterology;;   LAPAROSCOPIC CHOLECYSTECTOMY  1990   LEFT HEART CATHETERIZATION WITH CORONARY ANGIOGRAM N/A 05/26/2014   Procedure: LEFT HEART CATHETERIZATION WITH CORONARY ANGIOGRAM;  Surgeon: Lennette Bihari, MD;  Location: [facility] CATH LAB;  Service: Cardiovascular;  Laterality: N/A;   POLYPECTOMY  07/27/2021   Procedure: POLYPECTOMY;  Surgeon: Benancio Deeds, MD;  Location: [facility] ENDOSCOPY;  Service: Gastroenterology;;   [name]  07/27/2021   Procedure: [name];  Surgeon: Benancio Deeds, MD;  Location: [facility] ENDOSCOPY;  Service: Gastroenterology;;   SINUS SURGERY WITH INSTATRAK  2000   TOTAL HIP ARTHROPLASTY Left 12/05/2020   Procedure: TOTAL HIP ARTHROPLASTY ANTERIOR APPROACH;  Surgeon: Samson Frederic, MD;  Location: [facility] OR;  Service: Orthopedics;  Laterality: Left;   TUBAL LIGATION  ~ 1982   VAGINAL HYSTERECTOMY  2000    Allergies  Allergies  Allergen Reactions   Aspirin Other (See Comments)    Reaction: Vasculitis per MD   Codeine Itching and Rash    Tolerated Norco  12/07/20 with no reports of rash/itching. Patient endorsed allergy >30 yr ago and hasn't had reaction since. Climb the walls per pt   Naproxen Nausea Only    Reaction: Makes stomach upset/irritated.   Sulfonamide Derivatives Itching and Rash    History of Present Illness    Alexis [name] is a PMH of HTN, CAD, paroxysmal atrial fibrillation, acute on chronic diastolic CHF, OSA, GERD, type 2 diabetes, anxiety, fibromyalgia,  and anemia.  Her PMH also includes PVCs and PACs.  She wore a cardiac event monitor in 2018 showed short runs of SVT.  Her coronary calcium scoring showed mild two-vessel coronary disease with plaque in her LAD and RCA.  Her coronary calcium score was 90.  She had a repeat sleep study which showed an AHI of 8 per 24 hours.    She was seen by Bailey Mech, DNP on 05/10/2022.  During that time she reported worsening shortness of breath over the previous 2 weeks.  She noted decreased energy and weight gain.  She had followed up with her PCP who had informed her that her liver enzymes were rising.  She was taken off of atorvastatin.  She reported that she had not been using her CPAP.  Her husband reported that she does snore loudly.  She reported being exhausted during the day.  She was taking acetaminophen for chronic pain.  She was admitted to the hospital on 06/04/2022 and discharged on 06/06/2022.  She presented with exertional shortness of breath, fatigue, and orthopnea.  She indicated that her fatigue and shortness of breath had worsened over the previous week.  She denied chest discomfort.  She also noted bilateral lower extremity edema and abdominal swelling.  Her BMP showed a bit of an elevated glucose of 200 her CBC showed a hemoglobin of 11.8.  Her BNP was elevated at 1203.  Her EKG showed sinus bradycardia with left anterior fascicular block 55 bpm.  Her oxygen saturation was normal on room air.  She had no increased work of breathing at rest.  She was noted to have lower extremity swelling that extended to her knees.  She received IV diuresis and was noted to have good urine output.  She had progressive improvement with her symptoms.  She was able to ambulate without any shortness of breath.  She had resolution of her hypertension.  She was discharged in stable condition.    She is to the clinic today for follow-up evaluation states she is feeling better since being discharged from the hospital.   She has been avoiding salt.  She does notice that elevated her Lasix she is breathing better and has better mobility.  We reviewed the importance of daily weights and lower extremity support stockings.  She expressed understanding.  We reviewed her recent hospitalization.  We also reviewed proper way to measure blood pressure.  I will continue her on furosemide, give her a weight back, have her increase her physical activity as tolerated get the salty 6 diet sheet.  Today's she denies chest pain, shortness of breath, lower extremity edema, fatigue, palpitations, melena, hematuria, hemoptysis, diaphoresis, weakness, presyncope, syncope, orthopnea, and PND.   Acute on chronic diastolic CHF-weight today199***lbs. stable bilateral lower extremity generalized edema. Continue furosemide, metoprolol, spironolactone, valsartan Farxiga Heart healthy low-sodium diet-salty 6 reviewed Increase physical activity as tolerated Continue daily weights-contact office with a weight increase of 2 to 3 pounds overnight or 5 pounds in 1 week. Elevate lower extremities  Continue lower extremity support stockings  Coronary artery disease-no chest pain today. Continue metoprolol, amlodipine Heart healthy low-sodium diet Increase physical activity as tolerated  Essential hypertension-BP today 124/62.   Continue metoprolol, spironolactone, valsartan, amlodipine Heart healthy low-sodium diet-salty 6 reviewed Increase physical activity as tolerated Maintain blood pressure log  Paroxysmal atrial fibrillation-heart rate today 58.  Reports compliance with apixaban and denies bleeding issues.  She was noted to have elevation in her liver enzymes which was felt to be related to amiodarone.  Her amiodarone was discontinued. Continue  apixaban, metoprolol Avoid triggers  Disposition: Follow-up with Dr. Rennis Golden or me in 6 months  Home Medications    Prior to Admission medications   Medication Sig Start Date End Date  Taking? Authorizing Provider  acetaminophen (TYLENOL) 325 MG tablet Take 2 tablets (650 mg total) by mouth every 6 (six) hours as needed for mild pain or moderate pain. Patient taking differently: Take 325 mg by mouth every 6 (six) hours as needed for mild pain, moderate pain or headache. 12/12/20   Lynn Ito, MD  ACIPHEX 20 MG tablet Take 20 mg by mouth in the morning and at bedtime. 05/29/22   [provider]  ALPRAZolam Prudy Feeler) 0.5 MG tablet Take 0.5 mg by mouth at bedtime as needed.    [provider]  amiodarone (PACERONE) 200 MG tablet Take 0.5 tablets (100 mg total) by mouth daily. Patient not taking: Reported on 06/05/2022 11/02/21   Chrystie Nose, MD  apixaban (ELIQUIS) 5 MG TABS tablet Take 1 tablet (5 mg total) by mouth 2 (two) times daily. 07/31/21   Kathlen Mody, MD  b complex vitamins tablet Take 1 tablet by mouth at bedtime.    [provider]  CALCIUM PO Take 1 tablet by mouth at bedtime.    [provider]  FARXIGA 10 MG TABS tablet Take 10 mg by mouth in the morning. 09/10/19   [provider]  fish oil-omega-3 fatty acids 1000 MG capsule Take 1 g by mouth at bedtime.     [provider]  furosemide (LASIX) 20 MG tablet Take 1 tablet (20 mg total) by mouth 2 (two) times daily. 06/05/22 07/05/22  Gloris Manchester, MD  insulin lispro (HUMALOG) 100 UNIT/ML KwikPen Inject 8 Units into the skin 3 (three) times daily.    [provider]  metoprolol succinate (TOPROL-XL) 50 MG 24 hr tablet Take 1 tablet (50 mg total) by mouth daily. Take with or immediately following a meal. Patient taking differently: Take 100 mg by mouth in the morning and at bedtime. Take with or immediately following a meal. 07/29/21   Kathlen Mody, MD  pantoprazole (PROTONIX) 40 MG tablet Take 1 tablet (40 mg total) by mouth 2 (two) times daily. Patient taking differently: Take 40 mg by mouth daily. 07/29/21   Kathlen Mody, MD  pregabalin (LYRICA) 50 MG  capsule Take 50 mg by mouth 2 (two) times daily.    [provider]  spironolactone (ALDACTONE) 25 MG tablet Take 0.5 tablets (12.5 mg total) by mouth daily. Patient not taking: Reported on 06/05/2022 08/20/21   Lewie Chamber, MD  TRESIBA FLEXTOUCH 100 UNIT/ML FlexTouch Pen Inject 18 Units into the skin at bedtime. 09/29/19   [provider]  TRULICITY 1.5 MG/0.5ML SOPN Inject 1.5 mg into the skin every Sunday. 05/07/21   [provider]  valsartan (DIOVAN) 160 MG tablet Take 1 tablet (160 mg total) by mouth daily. 08/20/21   Lewie Chamber, MD  venlafaxine XR (EFFEXOR-XR) 150 MG 24 hr capsule  Take 150 mg by mouth 2 (two) times daily.    [provider]  zaleplon (SONATA) 5 MG capsule Take 5 mg by mouth at bedtime.    [provider]    Family History    Family History  Problem Relation Age of Onset   Colon polyps Father    Heart disease Father    Breast cancer Sister    COPD Brother    Irritable bowel syndrome Daughter    Ovarian cancer Maternal Aunt    Stomach cancer Maternal Aunt    Diabetes Maternal Aunt    Colon cancer Maternal Uncle    Heart disease Maternal Uncle    Heart disease Other        Grandparents   Esophageal cancer Neg Hx    Pancreatic cancer Neg Hx    She indicated that her mother is alive. She indicated that her father is alive. She indicated that her sister is alive. She indicated that the status of her brother is unknown. She indicated that her maternal grandmother is deceased. She indicated that her maternal grandfather is deceased. She indicated that her paternal grandmother is deceased. She indicated that her paternal grandfather is deceased. She indicated that two of her three daughters are alive. She indicated that the status of her neg hx is unknown. She indicated that the status of her other is unknown.  Social History    Social History   Socioeconomic History   Marital status: Married    Spouse name: Wandy Bossler   Number of children: 2   Years of education: Not on file   Highest education level: Some college, no degree  Occupational History   Occupation: Retired    Associate Professor: RETIRED  Tobacco Use   Smoking status: Never   Smokeless tobacco: Never  Vaping Use   Vaping Use: Never used  Substance and Sexual Activity   Alcohol use: No   Drug use: No   Sexual activity: Not Currently  Other Topics Concern   Not on file  Social History Narrative   Daily Caffeine, Coke   Social Determinants of Health   Financial Resource Strain: Low Risk  (04/27/2021)   Overall Financial Resource Strain (CARDIA)    Difficulty of Paying Living Expenses: Not very hard  Food Insecurity: No Food Insecurity (04/27/2021)   Hunger Vital Sign    Worried About Running Out of Food in the Last Year: Never true    Ran Out of Food in the Last Year: Never true  Transportation Needs: No Transportation Needs (04/27/2021)   PRAPARE - Administrator, Civil Service (Medical): No    Lack of Transportation (Non-Medical): No  Physical Activity: Not on file  Stress: Not on file  Social Connections: Not on file  Intimate Partner Violence: Not on file     Review of Systems    General:  No chills, fever, night sweats or weight changes.  Cardiovascular:  No chest pain, dyspnea on exertion, edema, orthopnea, palpitations, paroxysmal nocturnal dyspnea. Dermatological: No rash, lesions/masses Respiratory: No cough, dyspnea Urologic: No hematuria, dysuria Abdominal:   No nausea, vomiting, diarrhea, bright red blood per rectum, melena, or hematemesis Neurologic:  No visual changes, wkns, changes in mental status. All other systems reviewed and are otherwise negative except as noted above.  Physical Exam    VS:  There were no vitals taken for this visit. , BMI There is no height or weight on file to calculate BMI. GEN: Well nourished, well  developed, in no acute distress. HEENT: normal. Neck: Supple, no JVD,  carotid bruits, or masses. Cardiac: RRR, no murmurs, rubs, or gallops. No clubbing, cyanosis, generalized bilateral lower extremity edema.  Radials/DP/PT 2+ and equal bilaterally.  Respiratory:  Respirations regular and unlabored, clear to auscultation bilaterally. GI: Soft, nontender, nondistended, BS + x 4. MS: no deformity or atrophy. Skin: warm and dry, no rash. Neuro:  Strength and sensation are intact. Psych: Normal affect.  Accessory Clinical Findings    Recent Labs: 06/04/2022: B Natriuretic Peptide 1,203.1; Hemoglobin 11.8; Platelets 211 06/05/2022: Magnesium 2.0; TSH 3.668 11/13/2022: ALT 21; BUN 16; Creatinine, Ser 0.71; Potassium 4.1; Sodium 141   Recent Lipid Panel    Component Value Date/Time   CHOL 162 11/13/2022 0946   TRIG 130 11/13/2022 0946   HDL 66 11/13/2022 0946   CHOLHDL 2.5 11/13/2022 0946   CHOLHDL 4.0 07/25/2021 0237   VLDL 17 07/25/2021 0237   LDLCALC 74 11/13/2022 0946    No BP recorded.  {Refresh Note OR Click here to enter BP  :1}***    ECG personally reviewed by me today-none today.  Echocardiogram 07/25/2021  1. Left ventricular ejection fraction, by estimation, is 65 to 70%. The  left ventricle has normal function. The left ventricle has no regional  wall motion abnormalities. Left ventricular diastolic parameters are  consistent with Grade II diastolic  dysfunction (pseudonormalization).   2. Right ventricular systolic function is normal. The right ventricular  size is normal.   3. The mitral valve is degenerative. No evidence of mitral valve  regurgitation. No evidence of mitral stenosis. Moderate mitral annular  calcification.   4. The aortic valve is tricuspid. Aortic valve regurgitation is not  visualized. Aortic valve sclerosis is present, with no evidence of aortic  valve stenosis.   5. The inferior vena cava is normal in size with <50% respiratory  variability, suggesting right atrial pressure of 8 mmHg.    VAS [place] AAA Duplex  03/22/2022 Abdominal Aorta: No evidence of an abdominal aortic aneurysm was  visualized. The largest aortic measurement is 2.3 cm.  Stenosis:  Patent bilateral common and external iliac arteries without evidence of  stenosis.    Coronary CTA 11/21/2020 1.  Diffuse nonobstructive CAD, CADRADS = 2.   2. Coronary calcium score of 469. This was 93rd percentile for age and sex matched control.   3. Normal coronary origin with right dominance.   4.  Aortic atherosclerosis   5.  Small PFO without evidence of shunting.   6.  Mitral annular calcification.  Assessment & Plan   1. ***   Thomasene Ripple. Adanely Reynoso NP-C     11/27/2022, 7:48 AM [facility] [address] Office [phone] Fax [phone]  Notice: This dictation was prepared with Dragon dictation along with smaller phrase technology. Any transcriptional errors that result from this process are unintentional and may not be corrected upon review.  I spent 14 ***minutes examining this patient, reviewing medications, and using patient centered shared decision making involving her cardiac care.  Prior to her visit I spent greater than 20 minutes reviewing her past medical history,  medications, and prior cardiac tests.

## 2022-11-28 ENCOUNTER — Inpatient Hospital Stay (HOSPITAL_COMMUNITY)
Admission: EM | Admit: 2022-11-28 | Discharge: 2022-12-04 | DRG: 124 | Disposition: A | Discharge status: Home Health | Payer: Medicare Other | Attending: Internal Medicine | Admitting: Internal Medicine

## 2022-11-28 ENCOUNTER — Emergency Department (HOSPITAL_COMMUNITY): Payer: Medicare Other

## 2022-11-28 ENCOUNTER — Ambulatory Visit: Payer: Medicare Other | Attending: General Practice | Admitting: General Practice

## 2022-11-28 ENCOUNTER — Other Ambulatory Visit: Payer: Self-pay

## 2022-11-28 ENCOUNTER — Encounter (HOSPITAL_COMMUNITY): Payer: Self-pay

## 2022-11-28 DIAGNOSIS — Z7901 Long term (current) use of anticoagulants: Secondary | ICD-10-CM

## 2022-11-28 DIAGNOSIS — B020 Zoster encephalitis: Secondary | ICD-10-CM | POA: Diagnosis present

## 2022-11-28 DIAGNOSIS — I480 Paroxysmal atrial fibrillation: Secondary | ICD-10-CM | POA: Diagnosis present

## 2022-11-28 DIAGNOSIS — Z794 Long term (current) use of insulin: Secondary | ICD-10-CM | POA: Diagnosis not present

## 2022-11-28 DIAGNOSIS — E1165 Type 2 diabetes mellitus with hyperglycemia: Secondary | ICD-10-CM | POA: Diagnosis present

## 2022-11-28 DIAGNOSIS — Z79899 Other long term (current) drug therapy: Secondary | ICD-10-CM

## 2022-11-28 DIAGNOSIS — Z886 Allergy status to analgesic agent status: Secondary | ICD-10-CM | POA: Diagnosis not present

## 2022-11-28 DIAGNOSIS — Z885 Allergy status to narcotic agent status: Secondary | ICD-10-CM

## 2022-11-28 DIAGNOSIS — K219 Gastro-esophageal reflux disease without esophagitis: Secondary | ICD-10-CM | POA: Diagnosis present

## 2022-11-28 DIAGNOSIS — R739 Hyperglycemia, unspecified: Secondary | ICD-10-CM

## 2022-11-28 DIAGNOSIS — M170 Bilateral primary osteoarthritis of knee: Secondary | ICD-10-CM | POA: Diagnosis present

## 2022-11-28 DIAGNOSIS — Z981 Arthrodesis status: Secondary | ICD-10-CM

## 2022-11-28 DIAGNOSIS — G4700 Insomnia, unspecified: Secondary | ICD-10-CM | POA: Diagnosis present

## 2022-11-28 DIAGNOSIS — Z8542 Personal history of malignant neoplasm of other parts of uterus: Secondary | ICD-10-CM

## 2022-11-28 DIAGNOSIS — G8929 Other chronic pain: Secondary | ICD-10-CM | POA: Diagnosis present

## 2022-11-28 DIAGNOSIS — K589 Irritable bowel syndrome without diarrhea: Secondary | ICD-10-CM | POA: Diagnosis present

## 2022-11-28 DIAGNOSIS — Z8719 Personal history of other diseases of the digestive system: Secondary | ICD-10-CM

## 2022-11-28 DIAGNOSIS — N179 Acute kidney failure, unspecified: Secondary | ICD-10-CM | POA: Diagnosis present

## 2022-11-28 DIAGNOSIS — K760 Fatty (change of) liver, not elsewhere classified: Secondary | ICD-10-CM | POA: Diagnosis present

## 2022-11-28 DIAGNOSIS — Z91199 Patient's noncompliance with other medical treatment and regimen due to unspecified reason: Secondary | ICD-10-CM

## 2022-11-28 DIAGNOSIS — E669 Obesity, unspecified: Secondary | ICD-10-CM | POA: Diagnosis present

## 2022-11-28 DIAGNOSIS — E860 Dehydration: Secondary | ICD-10-CM | POA: Diagnosis present

## 2022-11-28 DIAGNOSIS — R4182 Altered mental status, unspecified: Secondary | ICD-10-CM | POA: Diagnosis present

## 2022-11-28 DIAGNOSIS — B021 Zoster meningitis: Secondary | ICD-10-CM | POA: Diagnosis present

## 2022-11-28 DIAGNOSIS — D849 Immunodeficiency, unspecified: Secondary | ICD-10-CM | POA: Diagnosis present

## 2022-11-28 DIAGNOSIS — E785 Hyperlipidemia, unspecified: Secondary | ICD-10-CM | POA: Diagnosis present

## 2022-11-28 DIAGNOSIS — E876 Hypokalemia: Secondary | ICD-10-CM | POA: Diagnosis present

## 2022-11-28 DIAGNOSIS — B029 Zoster without complications: Secondary | ICD-10-CM | POA: Insufficient documentation

## 2022-11-28 DIAGNOSIS — Z6830 Body mass index (BMI) 30.0-30.9, adult: Secondary | ICD-10-CM

## 2022-11-28 DIAGNOSIS — F419 Anxiety disorder, unspecified: Secondary | ICD-10-CM | POA: Diagnosis present

## 2022-11-28 DIAGNOSIS — Z8249 Family history of ischemic heart disease and other diseases of the circulatory system: Secondary | ICD-10-CM

## 2022-11-28 DIAGNOSIS — Z7985 Long-term (current) use of injectable non-insulin antidiabetic drugs: Secondary | ICD-10-CM

## 2022-11-28 DIAGNOSIS — K3184 Gastroparesis: Secondary | ICD-10-CM | POA: Diagnosis present

## 2022-11-28 DIAGNOSIS — I110 Hypertensive heart disease with heart failure: Secondary | ICD-10-CM | POA: Diagnosis present

## 2022-11-28 DIAGNOSIS — F32A Depression, unspecified: Secondary | ICD-10-CM | POA: Diagnosis present

## 2022-11-28 DIAGNOSIS — E1142 Type 2 diabetes mellitus with diabetic polyneuropathy: Secondary | ICD-10-CM | POA: Diagnosis present

## 2022-11-28 DIAGNOSIS — Z833 Family history of diabetes mellitus: Secondary | ICD-10-CM

## 2022-11-28 DIAGNOSIS — E119 Type 2 diabetes mellitus without complications: Secondary | ICD-10-CM

## 2022-11-28 DIAGNOSIS — I10 Essential (primary) hypertension: Secondary | ICD-10-CM | POA: Diagnosis present

## 2022-11-28 DIAGNOSIS — M549 Dorsalgia, unspecified: Secondary | ICD-10-CM | POA: Diagnosis present

## 2022-11-28 DIAGNOSIS — G43009 Migraine without aura, not intractable, without status migrainosus: Secondary | ICD-10-CM | POA: Diagnosis present

## 2022-11-28 DIAGNOSIS — E1143 Type 2 diabetes mellitus with diabetic autonomic (poly)neuropathy: Secondary | ICD-10-CM | POA: Diagnosis present

## 2022-11-28 DIAGNOSIS — Z7984 Long term (current) use of oral hypoglycemic drugs: Secondary | ICD-10-CM

## 2022-11-28 DIAGNOSIS — Z96642 Presence of left artificial hip joint: Secondary | ICD-10-CM | POA: Diagnosis present

## 2022-11-28 DIAGNOSIS — G4733 Obstructive sleep apnea (adult) (pediatric): Secondary | ICD-10-CM | POA: Diagnosis present

## 2022-11-28 DIAGNOSIS — H538 Other visual disturbances: Secondary | ICD-10-CM | POA: Diagnosis present

## 2022-11-28 DIAGNOSIS — B0230 Zoster ocular disease, unspecified: Secondary | ICD-10-CM | POA: Diagnosis present

## 2022-11-28 DIAGNOSIS — Z882 Allergy status to sulfonamides status: Secondary | ICD-10-CM

## 2022-11-28 DIAGNOSIS — Z9049 Acquired absence of other specified parts of digestive tract: Secondary | ICD-10-CM

## 2022-11-28 DIAGNOSIS — M797 Fibromyalgia: Secondary | ICD-10-CM | POA: Diagnosis present

## 2022-11-28 DIAGNOSIS — G9340 Encephalopathy, unspecified: Principal | ICD-10-CM | POA: Diagnosis present

## 2022-11-28 DIAGNOSIS — Z9851 Tubal ligation status: Secondary | ICD-10-CM

## 2022-11-28 DIAGNOSIS — G9341 Metabolic encephalopathy: Secondary | ICD-10-CM | POA: Diagnosis present

## 2022-11-28 DIAGNOSIS — I5032 Chronic diastolic (congestive) heart failure: Secondary | ICD-10-CM | POA: Diagnosis present

## 2022-11-28 DIAGNOSIS — Z90710 Acquired absence of both cervix and uterus: Secondary | ICD-10-CM

## 2022-11-28 LAB — BASIC METABOLIC PANEL
Anion gap: 17 — ABNORMAL HIGH (ref 5–15)
BUN: 40 mg/dL — ABNORMAL HIGH (ref 8–23)
CO2: 22 mmol/L (ref 22–32)
Calcium: 9.1 mg/dL (ref 8.9–10.3)
Chloride: 95 mmol/L — ABNORMAL LOW (ref 98–111)
Creatinine, Ser: 1.37 mg/dL — ABNORMAL HIGH (ref 0.44–1.00)
GFR, Estimated: 41 mL/min — ABNORMAL LOW (ref >60)
Glucose, Bld: 329 mg/dL — ABNORMAL HIGH (ref 70–99)
Potassium: 4.2 mmol/L (ref 3.5–5.1)
Sodium: 134 mmol/L — ABNORMAL LOW (ref 135–145)

## 2022-11-28 LAB — CBG MONITORING, ED
Glucose-Capillary: 451 mg/dL — ABNORMAL HIGH (ref 70–99)
Glucose-Capillary: 515 mg/dL (ref 70–99)
Glucose-Capillary: 453 mg/dL — ABNORMAL HIGH (ref 70–99)
Glucose-Capillary: 374 mg/dL — ABNORMAL HIGH (ref 70–99)
Glucose-Capillary: 482 mg/dL — ABNORMAL HIGH (ref 70–99)

## 2022-11-28 LAB — CBC WITH DIFFERENTIAL/PLATELET
Abs Immature Granulocytes: 0.04 10*3/uL (ref 0.00–0.07)
Basophils Absolute: 0.1 10*3/uL (ref 0.0–0.1)
Basophils Relative: 1 %
Eosinophils Absolute: 0 10*3/uL (ref 0.0–0.5)
Eosinophils Relative: 0 %
HCT: 50.7 % — ABNORMAL HIGH (ref 36.0–46.0)
Hemoglobin: 16.8 g/dL — ABNORMAL HIGH (ref 12.0–15.0)
Immature Granulocytes: 0 %
Lymphocytes Relative: 7 %
Lymphs Abs: 0.8 10*3/uL (ref 0.7–4.0)
MCH: 28.6 pg (ref 26.0–34.0)
MCHC: 33.1 g/dL (ref 30.0–36.0)
MCV: 86.2 fL (ref 80.0–100.0)
Monocytes Absolute: 0.7 10*3/uL (ref 0.1–1.0)
Monocytes Relative: 7 %
Neutro Abs: 8.8 10*3/uL — ABNORMAL HIGH (ref 1.7–7.7)
Neutrophils Relative %: 85 %
Platelets: 270 10*3/uL (ref 150–400)
RBC: 5.88 MIL/uL — ABNORMAL HIGH (ref 3.87–5.11)
RDW: 17.1 % — ABNORMAL HIGH (ref 11.5–15.5)
WBC: 10.4 10*3/uL (ref 4.0–10.5)
nRBC: 0 % (ref 0.0–0.2)

## 2022-11-28 LAB — I-STAT VENOUS BLOOD GAS, ED
Acid-Base Excess: 2 mmol/L (ref 0.0–2.0)
Bicarbonate: 26.7 mmol/L (ref 20.0–28.0)
Calcium, Ion: 1.01 mmol/L — ABNORMAL LOW (ref 1.15–1.40)
HCT: 52 % — ABNORMAL HIGH (ref 36.0–46.0)
Hemoglobin: 17.7 g/dL — ABNORMAL HIGH (ref 12.0–15.0)
O2 Saturation: 66 %
Potassium: 4.8 mmol/L (ref 3.5–5.1)
Sodium: 130 mmol/L — ABNORMAL LOW (ref 135–145)
TCO2: 28 mmol/L (ref 22–32)
pCO2, Ven: 40.4 mmHg — ABNORMAL LOW (ref 44–60)
pH, Ven: 7.428 (ref 7.25–7.43)
pO2, Ven: 33 mmHg (ref 32–45)

## 2022-11-28 LAB — GLUCOSE, CAPILLARY
Glucose-Capillary: 181 mg/dL — ABNORMAL HIGH (ref 70–99)
Glucose-Capillary: 248 mg/dL — ABNORMAL HIGH (ref 70–99)
Glucose-Capillary: 302 mg/dL — ABNORMAL HIGH (ref 70–99)

## 2022-11-28 LAB — I-STAT CHEM 8, ED
BUN: 40 mg/dL — ABNORMAL HIGH (ref 8–23)
Calcium, Ion: 0.96 mmol/L — ABNORMAL LOW (ref 1.15–1.40)
Chloride: 100 mmol/L (ref 98–111)
Creatinine, Ser: 0.9 mg/dL (ref 0.44–1.00)
Glucose, Bld: 486 mg/dL — ABNORMAL HIGH (ref 70–99)
HCT: 52 % — ABNORMAL HIGH (ref 36.0–46.0)
Hemoglobin: 17.7 g/dL — ABNORMAL HIGH (ref 12.0–15.0)
Potassium: 4.9 mmol/L (ref 3.5–5.1)
Sodium: 131 mmol/L — ABNORMAL LOW (ref 135–145)
TCO2: 28 mmol/L (ref 22–32)

## 2022-11-28 LAB — COMPREHENSIVE METABOLIC PANEL
ALT: 33 U/L (ref 0–44)
AST: 56 U/L — ABNORMAL HIGH (ref 15–41)
Albumin: 2.7 g/dL — ABNORMAL LOW (ref 3.5–5.0)
Alkaline Phosphatase: 140 U/L — ABNORMAL HIGH (ref 38–126)
Anion gap: 17 — ABNORMAL HIGH (ref 5–15)
BUN: 34 mg/dL — ABNORMAL HIGH (ref 8–23)
CO2: 22 mmol/L (ref 22–32)
Calcium: 8.9 mg/dL (ref 8.9–10.3)
Chloride: 92 mmol/L — ABNORMAL LOW (ref 98–111)
Creatinine, Ser: 1.19 mg/dL — ABNORMAL HIGH (ref 0.44–1.00)
GFR, Estimated: 49 mL/min — ABNORMAL LOW (ref >60)
Glucose, Bld: 474 mg/dL — ABNORMAL HIGH (ref 70–99)
Potassium: 5.1 mmol/L (ref 3.5–5.1)
Sodium: 131 mmol/L — ABNORMAL LOW (ref 135–145)
Total Bilirubin: 1 mg/dL (ref 0.3–1.2)
Total Protein: 7.1 g/dL (ref 6.5–8.1)

## 2022-11-28 LAB — OSMOLALITY: Osmolality: 316 mOsm/kg — ABNORMAL HIGH (ref 275–295)

## 2022-11-28 LAB — BETA-HYDROXYBUTYRIC ACID: Beta-Hydroxybutyric Acid: 0.44 mmol/L — ABNORMAL HIGH (ref 0.05–0.27)

## 2022-11-28 LAB — AMMONIA: Ammonia: 43 umol/L — ABNORMAL HIGH (ref 9–35)

## 2022-11-28 MED ORDER — SODIUM CHLORIDE 0.9 % IV SOLN
250.0000 mL | INTRAVENOUS | Status: DC | PRN
  Administered 2022-11-29 – 2022-12-03 (×2): 250 mL via INTRAVENOUS

## 2022-11-28 MED ORDER — ONDANSETRON HCL 4 MG PO TABS: 4.0000 mg | ORAL_TABLET | Freq: Four times a day (QID) | ORAL | Status: DC | PRN

## 2022-11-28 MED ORDER — DEXTROSE 5 % IV SOLN
10.0000 mg/kg | Freq: Three times a day (TID) | INTRAVENOUS | Status: DC
  Administered 2022-11-28 – 2022-12-04 (×17): 655 mg via INTRAVENOUS
  Filled 2022-11-28 (×22): qty 13.1

## 2022-11-28 MED ORDER — DEXTROSE IN LACTATED RINGERS 5 % IV SOLN: INTRAVENOUS | Status: DC

## 2022-11-28 MED ORDER — ONDANSETRON HCL 4 MG/2ML IJ SOLN: 4.0000 mg | Freq: Four times a day (QID) | INTRAMUSCULAR | Status: DC | PRN

## 2022-11-28 MED ORDER — SODIUM CHLORIDE 0.9 % IV SOLN
2.0000 g | Freq: Two times a day (BID) | INTRAVENOUS | Status: DC
  Administered 2022-11-28 – 2022-11-29 (×2): 2 g via INTRAVENOUS
  Filled 2022-11-28 (×3): qty 20

## 2022-11-28 MED ORDER — LORAZEPAM 2 MG/ML IJ SOLN
INTRAMUSCULAR | Status: AC
  Administered 2022-11-28: 0.5 mg via INTRAVENOUS
  Filled 2022-11-28: qty 1

## 2022-11-28 MED ORDER — SODIUM CHLORIDE 0.9 % IV SOLN
2.0000 g | INTRAVENOUS | Status: DC
  Administered 2022-11-28 – 2022-11-29 (×4): 2 g via INTRAVENOUS
  Filled 2022-11-28 (×10): qty 2000

## 2022-11-28 MED ORDER — METOPROLOL TARTRATE 5 MG/5ML IV SOLN
2.5000 mg | Freq: Three times a day (TID) | INTRAVENOUS | Status: DC
  Administered 2022-11-29 – 2022-11-30 (×5): 2.5 mg via INTRAVENOUS
  Filled 2022-11-28 (×4): qty 5

## 2022-11-28 MED ORDER — GANCICLOVIR 0.15 % OP GEL
1.0000 [drp] | Freq: Every day | OPHTHALMIC | Status: DC
  Filled 2022-11-28: qty 5

## 2022-11-28 MED ORDER — SODIUM CHLORIDE 0.9% FLUSH
3.0000 mL | Freq: Two times a day (BID) | INTRAVENOUS | Status: DC
  Administered 2022-11-29 – 2022-12-04 (×9): 3 mL via INTRAVENOUS

## 2022-11-28 MED ORDER — DEXTROSE 50 % IV SOLN: 0.0000 mL | INTRAVENOUS | Status: DC | PRN

## 2022-11-28 MED ORDER — ACETAMINOPHEN 325 MG PO TABS
650.0000 mg | ORAL_TABLET | Freq: Four times a day (QID) | ORAL | Status: DC | PRN
  Administered 2022-12-02 – 2022-12-04 (×4): 650 mg via ORAL
  Filled 2022-11-28 (×4): qty 2

## 2022-11-28 MED ORDER — LORAZEPAM 2 MG/ML IJ SOLN
1.0000 mg | Freq: Once | INTRAMUSCULAR | Status: AC
  Administered 2022-11-28: 1 mg via INTRAVENOUS
  Filled 2022-11-28: qty 1

## 2022-11-28 MED ORDER — VANCOMYCIN HCL 1750 MG/350ML IV SOLN
1750.0000 mg | Freq: Once | INTRAVENOUS | Status: AC
  Administered 2022-11-28: 1750 mg via INTRAVENOUS
  Filled 2022-11-28 (×2): qty 350

## 2022-11-28 MED ORDER — SODIUM CHLORIDE 0.9% FLUSH: 3.0000 mL | INTRAVENOUS | Status: DC | PRN

## 2022-11-28 MED ORDER — LACTATED RINGERS IV SOLN: INTRAVENOUS | Status: DC

## 2022-11-28 MED ORDER — INSULIN REGULAR(HUMAN) IN NACL 100-0.9 UT/100ML-% IV SOLN
INTRAVENOUS | Status: DC
  Administered 2022-11-28 (×2): 12 [IU]/h via INTRAVENOUS
  Administered 2022-11-29: 2.8 [IU]/h via INTRAVENOUS
  Administered 2022-11-29: 1.9 [IU]/h via INTRAVENOUS
  Filled 2022-11-28 (×2): qty 100

## 2022-11-28 MED ORDER — SODIUM CHLORIDE 0.9 % IV SOLN: INTRAVENOUS | Status: DC

## 2022-11-28 MED ORDER — VANCOMYCIN HCL 750 MG/150ML IV SOLN
750.0000 mg | Freq: Two times a day (BID) | INTRAVENOUS | Status: DC
  Administered 2022-11-29: 750 mg via INTRAVENOUS
  Filled 2022-11-28 (×4): qty 150

## 2022-11-28 MED ORDER — HYDRALAZINE HCL 20 MG/ML IJ SOLN: 5.0000 mg | Freq: Four times a day (QID) | INTRAMUSCULAR | Status: DC | PRN

## 2022-11-28 MED ORDER — ACETAMINOPHEN 650 MG RE SUPP: 650.0000 mg | Freq: Four times a day (QID) | RECTAL | Status: DC | PRN

## 2022-11-28 MED ORDER — SENNOSIDES-DOCUSATE SODIUM 8.6-50 MG PO TABS: 1.0000 | ORAL_TABLET | Freq: Every evening | ORAL | Status: DC | PRN

## 2022-11-28 MED ORDER — LORAZEPAM 2 MG/ML IJ SOLN: 0.5000 mg | Freq: Once | INTRAMUSCULAR | Status: AC

## 2022-11-28 NOTE — ED Triage Notes (Signed)
Pt to ED via EMS from home with c/o AMS and hyperglycemia. Per EMS pt husbands called out stating pt was not acting like her self. EMS stated that pt husband is poor historian but stated he thinks pt hasn't had her insulin since Monday, and she was recently dx with shingles to the right eye. Pt arrives to ED alert but disoriented to time and place. EMS stated pt bgl was 458 after a 550 mL bolus.

## 2022-11-28 NOTE — H&P (Signed)
History and Physical    Patient: Alexis [name]:[id number] DOB: 24-Nov-1950 DOA: 11/28/2022 DOS: the patient was seen and examined on 11/28/2022 PCP: Fatima Sanger, FNP  Patient coming from: Home  Chief Complaint:  Chief Complaint  Patient presents with   Hyperglycemia   Altered Mental Status   HPI: Alexis [name] is a 72 y.o. female with medical history significant of diabetic type II, depression, Vasculitis, diabetic peripheral neuropathy, gastroparesis, migraine, sleep apnea noncompliant with CPAP, presents from home with altered mental status.  Per husband patient's have not been acting herself.  She was  recently seen [facility] (on Monday)  and diagnosed with shingles.  She became confuse on Tuesday, she has been most of time in bed at home. She has been confuse, not making sense, agitated at times. He brought her to ED because she has become more confuse today.   She was noted to have blistering rash in the V1 distribution including the eye over the right side of her face on evaluation in the ED.  Evaluation  in the ED patient was found to have hyperglycemia, increased anion gap.  She was a started on insulin drip and IV fluids.  ED physician attempted LP and it was unsuccessful.    ED physician discussed with Dr. Vanessa Barbara with ophthalmology and recommendation was to treat for presumed zoster cold use ganciclovir drop if available.  Neurology has been also consulted and recommendation was for fluoroscopic guided LP with HSV and VZV and start acyclovir.    Review of Systems: Unable to review all systems due to lack of cooperation from patient. Past Medical History:  Diagnosis Date   Anxiety    Arthritis    "knees" (05/25/2014)   Chronic back pain    Depression    Diabetes mellitus type II    Diabetic peripheral neuropathy 10/20/2018   Diverticulitis    Fatty liver    Fibromyalgia    Gastric polyp    hyperplastic   Gastroparesis    "recently dx'd"  (05/25/2014)   GERD (gastroesophageal reflux disease)    H/O hiatal hernia    Hx of gastritis    Hyperlipidemia    Hypertension    Irritable bowel syndrome (IBS)    Memory difficulties 09/18/2017   Migraine without aura, without mention of intractable migraine without mention of status migrainosus    "related to allergies; have them in the spring and fall" (05/25/2014)   Obesity    Sleep apnea    "wore mask; took it off in my sleep; quit wearing it" (05/25/2014)   Uterine cancer dx'd 2000   surg only   Vasculitis    "irritates my legs"   Wears glasses    Past Surgical History:  Procedure Laterality Date   ANTERIOR CERVICAL DECOMPRESSION/DISCECTOMY FUSION 4 LEVELS N/A 05/07/2018   Procedure: ANTERIOR CERVICAL DECOMPRESSION FUSION, CERVICAL 4-5,CERVICAL 5-6, CERVICAL 6-7 WITH INSTRUMENTATION AND ALLOGRAFT;  Surgeon: Estill Bamberg, MD;  Location: [facility] OR;  Service: Orthopedics;  Laterality: N/A;   APPENDECTOMY  ~ 1967   BIOPSY  07/27/2021   Procedure: BIOPSY;  Surgeon: Benancio Deeds, MD;  Location: [facility] ENDOSCOPY;  Service: Gastroenterology;;   BREAST CYST EXCISION Right 1990   COLONOSCOPY     COLONOSCOPY WITH PROPOFOL N/A 07/27/2021   Procedure: COLONOSCOPY WITH PROPOFOL;  Surgeon: Benancio Deeds, MD;  Location: [facility] ENDOSCOPY;  Service: Gastroenterology;  Laterality: N/A;   ESOPHAGOGASTRODUODENOSCOPY (EGD) WITH PROPOFOL N/A 07/27/2021   Procedure: ESOPHAGOGASTRODUODENOSCOPY (EGD) WITH  PROPOFOL;  Surgeon: Benancio Deeds, MD;  Location: [facility] ENDOSCOPY;  Service: Gastroenterology;  Laterality: N/A;   HEMOSTASIS CLIP PLACEMENT  07/27/2021   Procedure: HEMOSTASIS CLIP PLACEMENT;  Surgeon: Benancio Deeds, MD;  Location: [facility] ENDOSCOPY;  Service: Gastroenterology;;   LAPAROSCOPIC CHOLECYSTECTOMY  1990   LEFT HEART CATHETERIZATION WITH CORONARY ANGIOGRAM N/A 05/26/2014   Procedure: LEFT HEART CATHETERIZATION WITH CORONARY ANGIOGRAM;  Surgeon: Lennette Bihari, MD;  Location:  [facility] CATH LAB;  Service: Cardiovascular;  Laterality: N/A;   POLYPECTOMY  07/27/2021   Procedure: POLYPECTOMY;  Surgeon: Benancio Deeds, MD;  Location: [facility] ENDOSCOPY;  Service: Gastroenterology;;   [name]  07/27/2021   Procedure: [name];  Surgeon: Benancio Deeds, MD;  Location: [facility] ENDOSCOPY;  Service: Gastroenterology;;   SINUS SURGERY WITH INSTATRAK  2000   TOTAL HIP ARTHROPLASTY Left 12/05/2020   Procedure: TOTAL HIP ARTHROPLASTY ANTERIOR APPROACH;  Surgeon: Samson Frederic, MD;  Location: [facility] OR;  Service: Orthopedics;  Laterality: Left;   TUBAL LIGATION  ~ 1982   VAGINAL HYSTERECTOMY  2000   Social History:  reports that she has never smoked. She has never used smokeless tobacco. She reports that she does not drink alcohol and does not use drugs.  Allergies  Allergen Reactions   Aspirin Other (See Comments)    Reaction: Vasculitis per MD   Codeine Itching and Rash    Tolerated Norco 12/07/20 with no reports of rash/itching. Patient endorsed allergy >30 yr ago and hasn't had reaction since. Climb the walls per pt   Naproxen Nausea Only    Reaction: Makes stomach upset/irritated.   Sulfonamide Derivatives Itching and Rash    Family History  Problem Relation Age of Onset   Colon polyps Father    Heart disease Father    Breast cancer Sister    COPD Brother    Irritable bowel syndrome Daughter    Ovarian cancer Maternal Aunt    Stomach cancer Maternal Aunt    Diabetes Maternal Aunt    Colon cancer Maternal Uncle    Heart disease Maternal Uncle    Heart disease Other        Grandparents   Esophageal cancer Neg Hx    Pancreatic cancer Neg Hx     Prior to Admission medications   Medication Sig Start Date End Date Taking? Authorizing Provider  acetaminophen (TYLENOL) 325 MG tablet Take 2 tablets (650 mg total) by mouth every 6 (six) hours as needed for mild pain or moderate pain. Patient taking differently: Take 325 mg by mouth every 6 (six) hours as  needed for mild pain, moderate pain or headache. 12/12/20   Lynn Ito, MD  ACIPHEX 20 MG tablet Take 20 mg by mouth in the morning and at bedtime. 05/29/22   [provider]  ALPRAZolam Prudy Feeler) 0.5 MG tablet Take 0.5 mg by mouth at bedtime as needed.    [provider]  amLODipine (NORVASC) 5 MG tablet Take 1 tablet (5 mg total) by mouth daily. 08/29/22 08/24/23  Hilty, Lisette Abu, MD  apixaban (ELIQUIS) 5 MG TABS tablet Take 1 tablet (5 mg total) by mouth 2 (two) times daily. 08/29/22   Hilty, Lisette Abu, MD  atorvastatin (LIPITOR) 20 MG tablet Take 1 tablet (20 mg total) by mouth daily. 08/29/22 08/24/23  Chrystie Nose, MD  b complex vitamins tablet Take 1 tablet by mouth at bedtime.    [provider]  CALCIUM PO Take 1 tablet by mouth at bedtime.    [provider]  FARXIGA 10 MG TABS tablet Take 10 mg by mouth in the morning. 09/10/19   [provider]  ferrous sulfate 325 (65 FE) MG tablet Take 1 tablet (325 mg total) by mouth every other day. 11/01/22   Unk Lightning, PA  fish oil-omega-3 fatty acids 1000 MG capsule Take 1 g by mouth at bedtime.     [provider]  furosemide (LASIX) 20 MG tablet Take 2 tablets (40 mg total) by mouth daily. 08/29/22   Hilty, Lisette Abu, MD  insulin lispro (HUMALOG) 100 UNIT/ML KwikPen Inject 8 Units into the skin 3 (three) times daily.    [provider]  metoprolol succinate (TOPROL-XL) 50 MG 24 hr tablet Take 1 tablet (50 mg total) by mouth daily. Take with or immediately following a meal. Patient taking differently: Take 100 mg by mouth in the morning and at bedtime. Take with or immediately following a meal. 07/29/21   Kathlen Mody, MD  pantoprazole (PROTONIX) 40 MG tablet Take 1 tablet (40 mg total) by mouth 2 (two) times daily. 07/29/21   Kathlen Mody, MD  pregabalin (LYRICA) 50 MG capsule Take 50 mg by mouth 2 (two) times daily.    [provider]  spironolactone (ALDACTONE)  25 MG tablet Take 0.5 tablets (12.5 mg total) by mouth daily. 08/29/22   Hilty, Lisette Abu, MD  TRESIBA FLEXTOUCH 100 UNIT/ML FlexTouch Pen Inject 18 Units into the skin at bedtime. 09/29/19   [provider]  TRULICITY 1.5 MG/0.5ML SOPN Inject 1.5 mg into the skin every Sunday. 05/07/21   [provider]  valsartan (DIOVAN) 160 MG tablet Take 1 tablet (160 mg total) by mouth daily. 08/20/21   Lewie Chamber, MD  venlafaxine XR (EFFEXOR-XR) 150 MG 24 hr capsule Take 150 mg by mouth 2 (two) times daily.    [provider]  zaleplon (SONATA) 5 MG capsule Take 5 mg by mouth at bedtime.    [provider]    Physical Exam: Vitals:   11/28/22 1412 11/28/22 1415 11/28/22 1600  BP: (!) 174/162 (!) 184/85   Pulse: 89 88   Resp: (!) 26 (!) 21   Temp:  98.2 F (36.8 C)   TempSrc:  Oral   SpO2: 100% 100%   Weight:   81.3 kg  Height:   5\' 4"  (1.626 m)   General; She is alert, sitting in bed, trying to get out bed. Not following command.  CVS; S 1, S 2 , RRR Lungs; BL air movement, CTA Abdomen; soft, nt, ND Extremities; no edema Neuro; she is alert, but not following command, protecting airway. Not answering questions.    Data Reviewed:  CBG 500, sodium 131, hb 17,CT head:   CT head: The inferior portion of the posterior fossa and foramen magnum are excluded from the coverage of the exam. Within this limitation, no acute intracranial abnormality.   Assessment and Plan: No notes have been filed under this hospital service. Service: Hospitalist  1-Acute Metabolic Encephalopathy: -Concern for Viral encephalitis, HSV, VZV. Also in setting hyperglycemia.  -Plan to proceed with MRI of the brain to rule out a stroke as well -Neurology has been consulted. -Admit patient to progressive bed -LP ordered under fluoroscopy/ Unsuccessful attempt by ED physician.  -Started on Acyclovir.  -I will add bacterial coverage. Start Ceftriaxone, Vancomycin and Ampicillin.    2-Zoster V 1 distribution. Started on Acyclovir.  ED physician discussed case with DR Vanessa Barbara, who recommend ganciclovir drop if available if  no IV acyclovir. No ganciclovir drop available.   3-Diabetes Type 2, Hyperglycemia, severe.  CBG 500, PH normal, Bicarb not low at 22. Gap was 16.  Agree with insulin gtt, IV fluids. When CBG better controlled and gap close will need to transition to SSI and long acting.  Monitor B-met Q 4 hours.   4-Dehydration, AKI.  Prior cr 0.7. presents with cr at 1.1 Continue with IV fluids.   5-HTN; on metoprolol at home, plan to schedule IV.  PRN hydralazine.  Hold norvasc, lasix, spironolactone  while NPO.   6-Chronic Diastolic HF; compensated. Monitor for volume overload. Holding diuretics.  7-History of A fib: amiodarone stop by cardiology 08/2022 due to transaminases.  Eliquis on hold for LP./      Advance Care Planning:   Code Status: Full Code Full code, discussed with Husband.   Consults: Neurology, ED discussed case with Dr Vanessa Barbara Ophthalmology   Family Communication: Husband at bedside.   Severity of Illness: The appropriate patient status for this patient is INPATIENT. Inpatient status is judged to be reasonable and necessary in order to provide the required intensity of service to ensure the patient's safety. The patient's presenting symptoms, physical exam findings, and initial radiographic and laboratory data in the context of their chronic comorbidities is felt to place them at high risk for further clinical deterioration. Furthermore, it is not anticipated that the patient will be medically stable for discharge from the hospital within 2 midnights of admission.   * I certify that at the point of admission it is my clinical judgment that the patient will require inpatient hospital care spanning beyond 2 midnights from the point of admission due to high intensity of service, high risk for further deterioration and high frequency of  surveillance required.*  Author: Alba Cory, MD 11/28/2022 7:12 PM  For on call review [email or web address].

## 2022-11-28 NOTE — Progress Notes (Signed)
Received pt from ED, hooked pt up to bedside monitor, spoke with pt spouse,bed in low position with bed alarm turned on. D/t patient present illness, it was decided to move pt to 4N Progressive unit, report called to nurse on 4N, pt transported in stable condition. Spouse, Onalee Hua, was called and made aware of pt new location.

## 2022-11-28 NOTE — ED Notes (Signed)
Report attempted x2

## 2022-11-28 NOTE — ED Notes (Signed)
..ED TO INPATIENT HANDOFF REPORT  ED Nurse Name and Phone #: [phone]  S Name/Age/Gender Alexis [name] 72 y.o. female Room/Bed: 028C/028C  Code Status   Code Status: Full Code  Home/SNF/Other Home Patient oriented to: self Is this baseline? No   Triage Complete: Triage complete  Chief Complaint Encephalopathy acute [G93.40]  Triage Note Pt to ED via EMS from home with c/o AMS and hyperglycemia. Per EMS pt husbands called out stating pt was not acting like her self. EMS stated that pt husband is poor historian but stated he thinks pt hasn't had her insulin since Monday, and she was recently dx with shingles to the right eye. Pt arrives to ED alert but disoriented to time and place. EMS stated pt bgl was 458 after a 550 mL bolus.    Allergies Allergies  Allergen Reactions   Aspirin Other (See Comments)    Reaction: Vasculitis per MD   Codeine Itching and Rash    Tolerated Norco 12/07/20 with no reports of rash/itching. Patient endorsed allergy >30 yr ago and hasn't had reaction since. Climb the walls per pt   Naproxen Nausea Only    Reaction: Makes stomach upset/irritated.   Sulfonamide Derivatives Itching and Rash    Level of Care/Admitting Diagnosis ED Disposition     ED Disposition  Admit   Condition  --   Comment  Hospital Area: [facility] [[id number]]  Level of Care: Progressive [102]  Admit to Progressive based on following criteria: NEUROLOGICAL AND NEUROSURGICAL complex patients with significant risk of instability, who do not meet ICU criteria, yet require close observation or frequent assessment (< / = every 2 - 4 hours) with medical / nursing intervention.  May admit patient to [facility] or [facility] if equivalent level of care is available:: No  Covid Evaluation: Asymptomatic - no recent exposure (last 10 days) testing not required  Diagnosis: Encephalopathy acute [960454]  Admitting Physician: Alba Cory [phone]  Attending  Physician: Alba Cory [phone]  Certification:: I certify this patient will need inpatient services for at least 2 midnights  Estimated Length of Stay: 4          B Medical/Surgery History Past Medical History:  Diagnosis Date   Anxiety    Arthritis    "knees" (05/25/2014)   Chronic back pain    Depression    Diabetes mellitus type II    Diabetic peripheral neuropathy 10/20/2018   Diverticulitis    Fatty liver    Fibromyalgia    Gastric polyp    hyperplastic   Gastroparesis    "recently dx'd" (05/25/2014)   GERD (gastroesophageal reflux disease)    H/O hiatal hernia    Hx of gastritis    Hyperlipidemia    Hypertension    Irritable bowel syndrome (IBS)    Memory difficulties 09/18/2017   Migraine without aura, without mention of intractable migraine without mention of status migrainosus    "related to allergies; have them in the spring and fall" (05/25/2014)   Obesity    Sleep apnea    "wore mask; took it off in my sleep; quit wearing it" (05/25/2014)   Uterine cancer dx'd 2000   surg only   Vasculitis    "irritates my legs"   Wears glasses    Past Surgical History:  Procedure Laterality Date   ANTERIOR CERVICAL DECOMPRESSION/DISCECTOMY FUSION 4 LEVELS N/A 05/07/2018   Procedure: ANTERIOR CERVICAL DECOMPRESSION FUSION, CERVICAL 4-5,CERVICAL 5-6, CERVICAL 6-7 WITH INSTRUMENTATION AND ALLOGRAFT;  Surgeon: Estill Bamberg, MD;  Location: [facility] OR;  Service: Orthopedics;  Laterality: N/A;   APPENDECTOMY  ~ 1967   BIOPSY  07/27/2021   Procedure: BIOPSY;  Surgeon: Benancio Deeds, MD;  Location: [facility] ENDOSCOPY;  Service: Gastroenterology;;   BREAST CYST EXCISION Right 1990   COLONOSCOPY     COLONOSCOPY WITH PROPOFOL N/A 07/27/2021   Procedure: COLONOSCOPY WITH PROPOFOL;  Surgeon: Benancio Deeds, MD;  Location: [facility] ENDOSCOPY;  Service: Gastroenterology;  Laterality: N/A;   ESOPHAGOGASTRODUODENOSCOPY (EGD) WITH PROPOFOL N/A 07/27/2021   Procedure:  ESOPHAGOGASTRODUODENOSCOPY (EGD) WITH PROPOFOL;  Surgeon: Benancio Deeds, MD;  Location: [facility] ENDOSCOPY;  Service: Gastroenterology;  Laterality: N/A;   HEMOSTASIS CLIP PLACEMENT  07/27/2021   Procedure: HEMOSTASIS CLIP PLACEMENT;  Surgeon: Benancio Deeds, MD;  Location: [facility] ENDOSCOPY;  Service: Gastroenterology;;   LAPAROSCOPIC CHOLECYSTECTOMY  1990   LEFT HEART CATHETERIZATION WITH CORONARY ANGIOGRAM N/A 05/26/2014   Procedure: LEFT HEART CATHETERIZATION WITH CORONARY ANGIOGRAM;  Surgeon: Lennette Bihari, MD;  [facility] CATH LAB;  Service: Cardiovascular;  Laterality: N/A;   POLYPECTOMY  07/27/2021   Procedure: POLYPECTOMY;  Surgeon: Benancio Deeds, MD;  Location: [facility] ENDOSCOPY;  Service: Gastroenterology;;   [name]  07/27/2021   Procedure: [name];  Surgeon: Benancio Deeds, MD;  Location: [facility] ENDOSCOPY;  Service: Gastroenterology;;   SINUS SURGERY WITH INSTATRAK  2000   TOTAL HIP ARTHROPLASTY Left 12/05/2020   Procedure: TOTAL HIP ARTHROPLASTY ANTERIOR APPROACH;  Surgeon: Samson Frederic, MD;  Location: [facility] OR;  Service: Orthopedics;  Laterality: Left;   TUBAL LIGATION  ~ 1982   VAGINAL HYSTERECTOMY  2000     A IV Location/Drains/Wounds Patient Lines/Drains/Airways Status     Active Line/Drains/Airways     Name Placement date Placement time Site Days   Peripheral IV 11/28/22 20 G Anterior;Left;Proximal Forearm 11/28/22  1413  Forearm  less than 1   Peripheral IV 11/28/22 20 G Anterior;Left Hand 11/28/22  1414  Hand  less than 1            Intake/Output Last 24 hours No intake or output data in the 24 hours ending 11/28/22 1912  Labs/Imaging Results for orders placed or performed during the hospital encounter of 11/28/22 (from the past 48 hour(s))  Beta-hydroxybutyric acid     Status: Abnormal   Collection Time: 11/28/22  2:14 PM  Result Value Ref Range   Beta-Hydroxybutyric Acid 0.44 (H) 0.05 - 0.27 mmol/L    Comment: Performed at [facility], [address], [place], [place] [address]  CBG monitoring, ED     Status: Abnormal   Collection Time: 11/28/22  2:17 PM  Result Value Ref Range   Glucose-Capillary 451 (H) 70 - 99 mg/dL    Comment: Glucose reference range applies only to samples taken after fasting for at least 8 hours.  CBC with Differential     Status: Abnormal   Collection Time: 11/28/22  2:19 PM  Result Value Ref Range   WBC 10.4 4.0 - 10.5 K/uL   RBC 5.88 (H) 3.87 - 5.11 MIL/uL   Hemoglobin 16.8 (H) 12.0 - 15.0 g/dL   HCT 60.4 (H) 54.0 - 98.1 %   MCV 86.2 80.0 - 100.0 fL   MCH 28.6 26.0 - 34.0 pg   MCHC 33.1 30.0 - 36.0 g/dL   RDW 19.1 (H) 47.8 - 29.5 %   Platelets 270 150 - 400 K/uL   nRBC 0.0 0.0 - 0.2 %   Neutrophils Relative %  85 %   Neutro Abs 8.8 (H) 1.7 - 7.7 K/uL   Lymphocytes Relative 7 %   Lymphs Abs 0.8 0.7 - 4.0 K/uL   Monocytes Relative 7 %   Monocytes Absolute 0.7 0.1 - 1.0 K/uL   Eosinophils Relative 0 %   Eosinophils Absolute 0.0 0.0 - 0.5 K/uL   Basophils Relative 1 %   Basophils Absolute 0.1 0.0 - 0.1 K/uL   Immature Granulocytes 0 %   Abs Immature Granulocytes 0.04 0.00 - 0.07 K/uL    Comment: Performed at [facility], [address], [place], [place] [address]  Comprehensive metabolic panel     Status: Abnormal   Collection Time: 11/28/22  2:19 PM  Result Value Ref Range   Sodium 131 (L) 135 - 145 mmol/L   Potassium 5.1 3.5 - 5.1 mmol/L   Chloride 92 (L) 98 - 111 mmol/L   CO2 22 22 - 32 mmol/L   Glucose, Bld 474 (H) 70 - 99 mg/dL    Comment: Glucose reference range applies only to samples taken after fasting for at least 8 hours.   BUN 34 (H) 8 - 23 mg/dL   Creatinine, Ser 6.04 (H) 0.44 - 1.00 mg/dL   Calcium 8.9 8.9 - 54.0 mg/dL   Total Protein 7.1 6.5 - 8.1 g/dL   Albumin 2.7 (L) 3.5 - 5.0 g/dL   AST 56 (H) 15 - 41 U/L   ALT 33 0 - 44 U/L   Alkaline Phosphatase 140 (H) 38 - 126 U/L   Total Bilirubin 1.0 0.3 - 1.2 mg/dL   GFR, Estimated 49 (L) >60  mL/min    Comment: (NOTE) Calculated using the CKD-EPI Creatinine Equation (2021)    Anion gap 17 (H) 5 - 15    Comment: Performed at [facility], [address] [place], [place] [address]  Osmolality     Status: Abnormal   Collection Time: 11/28/22  2:19 PM  Result Value Ref Range   Osmolality 316 (H) 275 - 295 mOsm/kg    Comment: REPEATED TO VERIFY Performed at [facility], [address] [place], [place] [address]   I-stat chem 8, ED (not at [facility], [facility] or [facility])     Status: Abnormal   Collection Time: 11/28/22  2:22 PM  Result Value Ref Range   Sodium 131 (L) 135 - 145 mmol/L   Potassium 4.9 3.5 - 5.1 mmol/L   Chloride 100 98 - 111 mmol/L   BUN 40 (H) 8 - 23 mg/dL   Creatinine, Ser 9.56 0.44 - 1.00 mg/dL   Glucose, Bld 213 (H) 70 - 99 mg/dL    Comment: Glucose reference range applies only to samples taken after fasting for at least 8 hours.   Calcium, Ion 0.96 (L) 1.15 - 1.40 mmol/L   TCO2 28 22 - 32 mmol/L   Hemoglobin 17.7 (H) 12.0 - 15.0 g/dL   HCT 08.6 (H) 57.8 - 46.9 %  I-Stat venous blood gas, ([facility] ED, [facility], [facility])     Status: Abnormal   Collection Time: 11/28/22  2:22 PM  Result Value Ref Range   pH, Ven 7.428 7.25 - 7.43   pCO2, Ven 40.4 (L) 44 - 60 mmHg   pO2, Ven 33 32 - 45 mmHg   Bicarbonate 26.7 20.0 - 28.0 mmol/L   TCO2 28 22 - 32 mmol/L   O2 Saturation 66 %   Acid-Base Excess 2.0 0.0 - 2.0 mmol/L   Sodium 130 (L)  135 - 145 mmol/L   Potassium 4.8 3.5 - 5.1 mmol/L   Calcium, Ion 1.01 (L) 1.15 - 1.40 mmol/L   HCT 52.0 (H) 36.0 - 46.0 %   Hemoglobin 17.7 (H) 12.0 - 15.0 g/dL   Sample type VENOUS    Comment NOTIFIED PHYSICIAN   CBG monitoring, ED     Status: Abnormal   Collection Time: 11/28/22  4:52 PM  Result Value Ref Range   Glucose-Capillary 515 (HH) 70 - 99 mg/dL    Comment: Glucose reference range applies only to samples taken after fasting for at least 8 hours.   Comment 1 Notify RN   CBG monitoring, ED     Status: Abnormal   Collection  Time: 11/28/22  5:58 PM  Result Value Ref Range   Glucose-Capillary 453 (H) 70 - 99 mg/dL    Comment: Glucose reference range applies only to samples taken after fasting for at least 8 hours.  CBG monitoring, ED     Status: Abnormal   Collection Time: 11/28/22  6:47 PM  Result Value Ref Range   Glucose-Capillary 482 (H) 70 - 99 mg/dL    Comment: Glucose reference range applies only to samples taken after fasting for at least 8 hours.   DG Chest Portable 1 View  Result Date: 11/28/2022 CLINICAL DATA:  Altered mental status. EXAM: PORTABLE CHEST 1 VIEW COMPARISON:  06/04/2022 FINDINGS: Normal heart size. Stable mediastinal contours. Aortic atherosclerosis. No convincing pleural effusion, small pleural effusions on prior exam or seen only on the lateral view. No focal airspace disease, pulmonary edema, or pneumothorax. No acute osseous findings. IMPRESSION: No acute chest findings. Electronically Signed   By: Narda Rutherford M.D.   On: 11/28/2022 15:37   CT Head Wo Contrast  Result Date: 11/28/2022 CLINICAL DATA:  Headache, new onset. EXAM: CT HEAD WITHOUT CONTRAST TECHNIQUE: Contiguous axial images were obtained from the base of the skull through the vertex without intravenous contrast. RADIATION DOSE REDUCTION: This exam was performed according to the departmental dose-optimization program which includes automated exposure control, adjustment of the mA and/or kV according to patient size and/or use of iterative reconstruction technique. COMPARISON:  Head CT 08/16/2022. FINDINGS: Brain: The inferior portion of the posterior fossa and foramen magnum are excluded from the coverage of the exam. Within this limitation, no acute intracranial hemorrhage. Gray-white differentiation is preserved. No hydrocephalus or extra-axial collection. No mass effect or midline shift. Vascular: No hyperdense vessel or unexpected calcification. Skull: No calvarial fracture or suspicious bone lesion. Skull base is  unremarkable. Sinuses/Orbits: Unremarkable. Other: None. IMPRESSION: The inferior portion of the posterior fossa and foramen magnum are excluded from the coverage of the exam. Within this limitation, no acute intracranial abnormality. Electronically Signed   By: Orvan Falconer M.D.   On: 11/28/2022 14:54    Pending Labs Unresulted Labs (From admission, onward)     Start     Ordered   11/29/22 0500  Basic metabolic panel  Daily,   R      11/28/22 1655   11/28/22 1844  Basic metabolic panel  Now then every 4 hours,   R (with TIMED occurrences)      11/28/22 1843   11/28/22 1730  HSV 1/2 PCR, CSF (reference lab) Cerebrospinal Fluid  Once,   URGENT        11/28/22 1729   11/28/22 1730  VZV PCR, CSF  Once,   URGENT        11/28/22 1729   11/28/22 1549  Meningitis/Encephalitis Panel (CSF)  (CSF Labs)  Once,   URGENT        11/28/22 1548   11/28/22 1415  Urinalysis, w/ Reflex to Culture (Infection Suspected) -Urine, Clean Catch  Once,   URGENT       Question:  Specimen Source  Answer:  Urine, Clean Catch   11/28/22 1414            Vitals/Pain Today's Vitals   11/28/22 1412 11/28/22 1415 11/28/22 1600  BP: (!) 174/162 (!) 184/85   Pulse: 89 88   Resp: (!) 26 (!) 21   Temp:  98.2 F (36.8 C)   TempSrc:  Oral   SpO2: 100% 100%   Weight:   81.3 kg  Height:    (1.626 m)    Isolation Precautions No active isolations  Medications Medications  insulin regular, human (MYXREDLIN) 100 units/ 100 mL infusion (16 Units/hr Intravenous Rate/Dose Change 11/28/22 1850)  lactated ringers infusion ( Intravenous New Bag/Given 11/28/22 1656)  dextrose 5 % in lactated ringers infusion (has no administration in time range)  dextrose 50 % solution 0-50 mL (has no administration in time range)  acyclovir (ZOVIRAX) 655 mg in dextrose 5 % 100 mL IVPB (0 mg Intravenous Stopped 11/28/22 1844)  0.9 %  sodium chloride infusion (has no administration in time range)  metoprolol tartrate (LOPRESSOR)  injection 2.5 mg (has no administration in time range)  hydrALAZINE (APRESOLINE) injection 5 mg (has no administration in time range)  cefTRIAXone (ROCEPHIN) 2 g in sodium chloride 0.9 % 100 mL IVPB (has no administration in time range)  ampicillin (OMNIPEN) 2 g in sodium chloride 0.9 % 100 mL IVPB (has no administration in time range)  LORazepam (ATIVAN) 2 MG/ML injection (has no administration in time range)  LORazepam (ATIVAN) injection 1 mg (1 mg Intravenous Given 11/28/22 1641)    Mobility walks     Focused Assessments Cardiac Assessment Handoff:    Lab Results  Component Value Date   CKTOTAL 67 08/18/2021   TROPONINI <0.30 05/26/2014   Lab Results  Component Value Date   DDIMER <0.27 05/25/2014   Does the Patient currently have chest pain? No   , Neuro Assessment Handoff:  Swallow screen pass? No          Neuro Assessment:   Neuro Checks:      Has TPA been given? No If patient is a Neuro Trauma and patient is going to OR before floor call report to 4N Charge nurse: [phone] or [phone]   R Recommendations: See Admitting Provider Note  Report given to:   Additional Notes: na

## 2022-11-28 NOTE — ED Notes (Signed)
Report attempted x 1

## 2022-11-28 NOTE — ED Provider Notes (Signed)
[place] EMERGENCY DEPARTMENT AT [facility] Provider Note   CSN: [id number] Arrival date & time: 11/28/22  1404     History  Chief Complaint  Patient presents with   Hyperglycemia   Altered Mental Status    Alexis [name] is a 72 y.o. female.  HPI     This is a 72 year old female who presents with altered mental status.  Per the patient's husband, she has not been acting right for the last several days.  She was seen at a [facility] [place] and diagnosed with shingles.  He reports that she was started on "an antibiotic."  She has not seen an eye doctor.  He states that she has been talking out of her head and not really making sense.  He is unsure whether she has been taking her meds including her insulin.  Patient is alert but unable to really answer any questions in a directed way.  Her answers do not make any sense.  Home Medications Prior to Admission medications   Medication Sig Start Date End Date Taking? Authorizing Provider  acetaminophen (TYLENOL) 325 MG tablet Take 2 tablets (650 mg total) by mouth every 6 (six) hours as needed for mild pain or moderate pain. Patient taking differently: Take 325 mg by mouth every 6 (six) hours as needed for mild pain, moderate pain or headache. 12/12/20   Lynn Ito, MD  ACIPHEX 20 MG tablet Take 20 mg by mouth in the morning and at bedtime. 05/29/22   [provider]  ALPRAZolam Prudy Feeler) 0.5 MG tablet Take 0.5 mg by mouth at bedtime as needed.    [provider]  amLODipine (NORVASC) 5 MG tablet Take 1 tablet (5 mg total) by mouth daily. 08/29/22 08/24/23  Hilty, Lisette Abu, MD  apixaban (ELIQUIS) 5 MG TABS tablet Take 1 tablet (5 mg total) by mouth 2 (two) times daily. 08/29/22   Hilty, Lisette Abu, MD  atorvastatin (LIPITOR) 20 MG tablet Take 1 tablet (20 mg total) by mouth daily. 08/29/22 08/24/23  Chrystie Nose, MD  b complex vitamins tablet Take 1 tablet by mouth at bedtime.    [provider]   CALCIUM PO Take 1 tablet by mouth at bedtime.    [provider]  FARXIGA 10 MG TABS tablet Take 10 mg by mouth in the morning. 09/10/19   [provider]  ferrous sulfate 325 (65 FE) MG tablet Take 1 tablet (325 mg total) by mouth every other day. 11/01/22   Unk Lightning, PA  fish oil-omega-3 fatty acids 1000 MG capsule Take 1 g by mouth at bedtime.     [provider]  furosemide (LASIX) 20 MG tablet Take 2 tablets (40 mg total) by mouth daily. 08/29/22   Hilty, Lisette Abu, MD  insulin lispro (HUMALOG) 100 UNIT/ML KwikPen Inject 8 Units into the skin 3 (three) times daily.    [provider]  metoprolol succinate (TOPROL-XL) 50 MG 24 hr tablet Take 1 tablet (50 mg total) by mouth daily. Take with or immediately following a meal. Patient taking differently: Take 100 mg by mouth in the morning and at bedtime. Take with or immediately following a meal. 07/29/21   Kathlen Mody, MD  pantoprazole (PROTONIX) 40 MG tablet Take 1 tablet (40 mg total) by mouth 2 (two) times daily. 07/29/21   Kathlen Mody, MD  pregabalin (LYRICA) 50 MG capsule Take 50 mg by mouth 2 (two) times daily.    [provider]  spironolactone (ALDACTONE) 25 MG tablet Take 0.5 tablets (12.5 mg total) by mouth daily. 08/29/22   Hilty, Lisette Abu, MD  TRESIBA FLEXTOUCH 100 UNIT/ML FlexTouch Pen Inject 18 Units into the skin at bedtime. 09/29/19   [provider]  TRULICITY 1.5 MG/0.5ML SOPN Inject 1.5 mg into the skin every Sunday. 05/07/21   [provider]  valsartan (DIOVAN) 160 MG tablet Take 1 tablet (160 mg total) by mouth daily. 08/20/21   Lewie Chamber, MD  venlafaxine XR (EFFEXOR-XR) 150 MG 24 hr capsule Take 150 mg by mouth 2 (two) times daily.    [provider]  zaleplon (SONATA) 5 MG capsule Take 5 mg by mouth at bedtime.    [provider]      Allergies    Aspirin, Codeine, Naproxen, and Sulfonamide derivatives    Review of Systems    Review of Systems  Unable to perform ROS: Mental status change    Physical Exam Updated Vital Signs BP (!) 184/85   Pulse 88   Temp 98.2 F (36.8 C) (Oral)   Resp (!) 21   Ht 1.626 m (5\' 4" )   Wt 81.3 kg   SpO2 100%   BMI 30.77 kg/m  Physical Exam Vitals and nursing note reviewed.  Constitutional:      Appearance: She is well-developed. She is ill-appearing.  HENT:     Head: Normocephalic and atraumatic.     Mouth/Throat:     Mouth: Mucous membranes are moist.  Eyes:     Pupils: Pupils are equal, round, and reactive to light.     Comments: Injected conjunctiva on the right; PERRL, blink to threat intact, unable to assess visual acuity  Cardiovascular:     Rate and Rhythm: Normal rate and regular rhythm.     Heart sounds: Normal heart sounds.  Pulmonary:     Effort: Pulmonary effort is normal. No respiratory distress.     Breath sounds: No wheezing.  Abdominal:     Palpations: Abdomen is soft.     Tenderness: There is abdominal tenderness.  Musculoskeletal:     Cervical back: Neck supple.  Skin:    General: Skin is warm and dry.     Comments: Blistering rash in the V1 distribution including the eye over the right side of the face  Neurological:     Mental Status: She is alert.     Comments: Disoriented, moves all 4 extremities, nonsensical speech  Psychiatric:        Mood and Affect: Mood normal.     ED Results / Procedures / Treatments   Labs (all labs ordered are listed, but only abnormal results are displayed) Labs Reviewed  BETA-HYDROXYBUTYRIC ACID - Abnormal; Notable for the following components:      Result Value   Beta-Hydroxybutyric Acid 0.44 (*)    All other components within normal limits  CBC WITH DIFFERENTIAL/PLATELET - Abnormal; Notable for the following components:   RBC 5.88 (*)    Hemoglobin 16.8 (*)    HCT 50.7 (*)    RDW 17.1 (*)    Neutro Abs 8.8 (*)    All other components within normal limits  COMPREHENSIVE METABOLIC PANEL -  Abnormal; Notable for the following components:   Sodium 131 (*)    Chloride 92 (*)    Glucose, Bld 474 (*)    BUN 34 (*)    Creatinine, Ser 1.19 (*)    Albumin 2.7 (*)    AST 56 (*)    Alkaline  Phosphatase 140 (*)    GFR, Estimated 49 (*)    Anion gap 17 (*)    All other components within normal limits  OSMOLALITY - Abnormal; Notable for the following components:   Osmolality 316 (*)    All other components within normal limits  I-STAT CHEM 8, ED - Abnormal; Notable for the following components:   Sodium 131 (*)    BUN 40 (*)    Glucose, Bld 486 (*)    Calcium, Ion 0.96 (*)    Hemoglobin 17.7 (*)    HCT 52.0 (*)    All other components within normal limits  I-STAT VENOUS BLOOD GAS, ED - Abnormal; Notable for the following components:   pCO2, Ven 40.4 (*)    Sodium 130 (*)    Calcium, Ion 1.01 (*)    HCT 52.0 (*)    Hemoglobin 17.7 (*)    All other components within normal limits  CBG MONITORING, ED - Abnormal; Notable for the following components:   Glucose-Capillary 451 (*)    All other components within normal limits  CBG MONITORING, ED - Abnormal; Notable for the following components:   Glucose-Capillary 515 (*)    All other components within normal limits  VZV PCR, CSF  URINALYSIS, W/ REFLEX TO CULTURE (INFECTION SUSPECTED)  MENINGITIS/ENCEPHALITIS PANEL (CSF)  BASIC METABOLIC PANEL  HSV 1/2 PCR, CSF    EKG EKG Interpretation  Date/Time:  Thursday November 28 2022 14:27:13 EDT Ventricular Rate:  90 PR Interval:  207 QRS Duration: 119 QT Interval:  410 QTC Calculation: 502 R Axis:   -82 Text Interpretation: Sinus rhythm Incomplete RBBB and LAFB Left ventricular hypertrophy Anterior infarct, old ST elevation, consider inferior injury faster than prior Confirmed by Ross Marcus ([id number]) on 11/28/2022 3:07:15 PM  Radiology DG Chest Portable 1 View  Result Date: 11/28/2022 CLINICAL DATA:  Altered mental status. EXAM: PORTABLE CHEST 1 VIEW COMPARISON:   06/04/2022 FINDINGS: Normal heart size. Stable mediastinal contours. Aortic atherosclerosis. No convincing pleural effusion, small pleural effusions on prior exam or seen only on the lateral view. No focal airspace disease, pulmonary edema, or pneumothorax. No acute osseous findings. IMPRESSION: No acute chest findings. Electronically Signed   By: Narda Rutherford M.D.   On: 11/28/2022 15:37   CT Head Wo Contrast  Result Date: 11/28/2022 CLINICAL DATA:  Headache, new onset. EXAM: CT HEAD WITHOUT CONTRAST TECHNIQUE: Contiguous axial images were obtained from the base of the skull through the vertex without intravenous contrast. RADIATION DOSE REDUCTION: This exam was performed according to the departmental dose-optimization program which includes automated exposure control, adjustment of the mA and/or kV according to patient size and/or use of iterative reconstruction technique. COMPARISON:  Head CT 08/16/2022. FINDINGS: Brain: The inferior portion of the posterior fossa and foramen magnum are excluded from the coverage of the exam. Within this limitation, no acute intracranial hemorrhage. Gray-white differentiation is preserved. No hydrocephalus or extra-axial collection. No mass effect or midline shift. Vascular: No hyperdense vessel or unexpected calcification. Skull: No calvarial fracture or suspicious bone lesion. Skull base is unremarkable. Sinuses/Orbits: Unremarkable. Other: None. IMPRESSION: The inferior portion of the posterior fossa and foramen magnum are excluded from the coverage of the exam. Within this limitation, no acute intracranial abnormality. Electronically Signed   By: Orvan Falconer M.D.   On: 11/28/2022 14:54    Procedures .Critical Care  Performed by: Shon Baton, MD Authorized by: Shon Baton, MD   Critical care provider statement:    Critical care  time (minutes):  50   Critical care was necessary to treat or prevent imminent or life-threatening deterioration of  the following conditions:  Metabolic crisis, CNS failure or compromise and dehydration   Critical care was time spent personally by me on the following activities:  Development of treatment plan with patient or surrogate, discussions with consultants, evaluation of patient's response to treatment, examination of patient, ordering and review of laboratory studies, ordering and review of radiographic studies, ordering and performing treatments and interventions, pulse oximetry, re-evaluation of patient's condition and review of old charts .Lumbar Puncture  Date/Time: 11/28/2022 5:41 PM  Performed by: Shon Baton, MD Authorized by: Shon Baton, MD   Consent:    Consent obtained:  Written   Consent given by:  Spouse   Risks, benefits, and alternatives were discussed: yes     Risks discussed:  Bleeding, infection and pain   Alternatives discussed:  No treatment Pre-procedure details:    Procedure purpose:  Diagnostic   Preparation: Patient was prepped and draped in usual sterile fashion   Anesthesia:    Anesthesia method:  Local infiltration   Local anesthetic:  Lidocaine 1% w/o epi Procedure details:    Lumbar space:  L3-L4 interspace   Patient position:  L lateral decubitus   Needle gauge:  20   Needle type:  Spinal needle - Quincke tip   Needle length (in):  3.5   Ultrasound guidance: no     Number of attempts:  1 Comments:     Attempt made, patient in suboptimal positioning     Medications Ordered in ED Medications  insulin regular, human (MYXREDLIN) 100 units/ 100 mL infusion (12 Units/hr Intravenous New Bag/Given 11/28/22 1720)  lactated ringers infusion ( Intravenous New Bag/Given 11/28/22 1656)  dextrose 5 % in lactated ringers infusion (has no administration in time range)  dextrose 50 % solution 0-50 mL (has no administration in time range)  acyclovir (ZOVIRAX) 655 mg in dextrose 5 % 100 mL IVPB (has no administration in time range)  0.9 %  sodium chloride  infusion (has no administration in time range)  LORazepam (ATIVAN) injection 1 mg (1 mg Intravenous Given 11/28/22 1641)    ED Course/ Medical Decision Making/ A&P Clinical Course as of 11/28/22 1740  Thu Nov 28, 2022  1627 With Dr. Vanessa Barbara, ophthalmology.  Recommends treatment for presumed zoster and if drops are available ganciclovir or trifluridine would be his recommendation. [CH]  1731 LP attempted after confirming no Eliquis in the last 24 hours.  Unable to obtain LP.  Patient was in suboptimal positioning.  Discussed with neurology, [place].  Recommends fluoroscopy guided LP with HSV and VZV. [CH]  1732 No ganciclovir available [CH]    Clinical Course User Index [CH] Delories Mauri, Mayer Masker, MD                             Medical Decision Making Amount and/or Complexity of Data Reviewed Labs: ordered. Radiology: ordered.  Risk Prescription drug management. Decision regarding hospitalization.   This patient presents to the ED for concern of altered mental status, this involves an extensive number of treatment options, and is a complaint that carries with it a high risk of complications and morbidity.  I considered the following differential and admission for this acute, potentially life threatening condition.  The differential diagnosis includes encephalopathy, hyperglycemia, DKA, dehydration, stroke, metabolic derangement  MDM:    This is a 72 year old female who  presents with altered mental status.  She is ill-appearing but nontoxic.  Vital signs notable for blood pressure of 184/85.  She is afebrile.  She notably has herpes zoster in the V1 distribution involving her eye.  Husband is also poor historian but it sounds like she has been on some medication since Monday but has not seen an eye doctor.  She is clearly encephalopathic.  She has some expressive word salad.  Husband states she has been like this for several days.  CT head is negative.  She is hyperglycemic with a gap of 17  but her pH is normal.  Elevated beta hydroxybutyrate at 0.44.  Will start on an insulin drip.  Have to consider herpes encephalitis given her obvious zoster.  Husband reports that she has not taken her Eliquis in over 24 hours and he is sure of this.  I did attempt a LP at the bedside; however, given suboptimal positioning and not wanting to be at further risk for possible hematoma, this was unsuccessful.  Have ordered a fluoroscopy guided LP.  She was started on IV acyclovir.  Neurology and ophthalmology consulted.  He does appear dehydrated with a slightly elevated creatinine.  She was given fluids.  Will plan for admission to the hospital.  (Labs, imaging, consults)  Labs: I Ordered, and personally interpreted labs.  The pertinent results include: CBC, CMP, urinalysis, beta hydroxybutyrate  Imaging Studies ordered: I ordered imaging studies including chest x-ray, CT head, MRI pending I independently visualized and interpreted imaging. I agree with the radiologist interpretation  Additional history obtained from husband at bedside.  External records from outside source obtained and reviewed including evaluations  Cardiac Monitoring: The patient was maintained on a cardiac monitor.  If on the cardiac monitor, I personally viewed and interpreted the cardiac monitored which showed an underlying rhythm of: Sinus rhythm  Reevaluation: After the interventions noted above, I reevaluated the patient and found that they have :stayed the same  Social Determinants of Health:  lives with husband  Disposition: Admit  Co morbidities that complicate the patient evaluation  Past Medical History:  Diagnosis Date   Anxiety    Arthritis    "knees" (05/25/2014)   Chronic back pain    Depression    Diabetes mellitus type II    Diabetic peripheral neuropathy 10/20/2018   Diverticulitis    Fatty liver    Fibromyalgia    Gastric polyp    hyperplastic   Gastroparesis    "recently dx'd"  (05/25/2014)   GERD (gastroesophageal reflux disease)    H/O hiatal hernia    Hx of gastritis    Hyperlipidemia    Hypertension    Irritable bowel syndrome (IBS)    Memory difficulties 09/18/2017   Migraine without aura, without mention of intractable migraine without mention of status migrainosus    "related to allergies; have them in the spring and fall" (05/25/2014)   Obesity    Sleep apnea    "wore mask; took it off in my sleep; quit wearing it" (05/25/2014)   Uterine cancer dx'd 2000   surg only   Vasculitis    "irritates my legs"   Wears glasses      Medicines Meds ordered this encounter  Medications   insulin regular, human (MYXREDLIN) 100 units/ 100 mL infusion    Order Specific Question:   EndoTool low target:    Answer:   140    Order Specific Question:   EndoTool high target:    Answer:  180    Order Specific Question:   Type of Diabetes    Answer:   Type 2    Order Specific Question:   Mode of Therapy    Answer:   Hyperglycemia    Order Specific Question:   Start Method    Answer:   EndoTool to calculate   lactated ringers infusion   dextrose 5 % in lactated ringers infusion   dextrose 50 % solution 0-50 mL   DISCONTD: Ganciclovir (ZIRGAN) 0.15 % ophthalmic gel 1 drop   LORazepam (ATIVAN) injection 1 mg   acyclovir (ZOVIRAX) 655 mg in dextrose 5 % 100 mL IVPB   0.9 %  sodium chloride infusion    I have reviewed the patients home medicines and have made adjustments as needed  Problem List / ED Course: Problem List Items Addressed This Visit   None Visit Diagnoses     Encephalopathy acute    -  Primary   Herpes zoster with ophthalmic complication, unspecified herpes zoster eye disease       Relevant Medications   LORazepam (ATIVAN) injection 1 mg (Completed)   acyclovir (ZOVIRAX) 655 mg in dextrose 5 % 100 mL IVPB   Hyperglycemia       Dehydration                       Final Clinical Impression(s) / ED Diagnoses Final diagnoses:   Encephalopathy acute  Herpes zoster with ophthalmic complication, unspecified herpes zoster eye disease  Hyperglycemia  Dehydration    Rx / DC Orders ED Discharge Orders     None         Shon Baton, MD 11/28/22 1746

## 2022-11-28 NOTE — Progress Notes (Addendum)
Pharmacy Antibiotic Note  Alexis [name] is a 72 y.o. female admitted on 11/28/2022 with  herpes encephalitis .  Pharmacy has been consulted for acyclovir and vancomycin dosing.  Patient presenting with AMS and hyperglycemia. Patient's husband reports she has not taken her insulin since Monday, 4/15. He reports that she was recently diagnosed with shingles to the right eye as well. Based on weight from March 2024 (81.3 kg), the patient's calculated CrCl is ~56 ml/min. Patient's TBW is >140% of her IBW, so will use her AdjBW of 65kg for acyclovir dosing.   Will utilize trough-based dosing for vancomycin given potential meningitis infection.   Plan: -Start acyclovir IV /kg q8hrs (using AdjBW of 65kg)  -Give vancomycin  IV x1, followed by vancomycin  IV q12hrs (goal trough 15-20) -Get daily BMP x3 days then q72hrs if no evidence of renal dysfunction  -NS 169ml/hr has been profiled in case the already ordered D5LR is not started -Monitor renal function and overall clinical picture -F/u meningitis/encephalitis panel     Temp (24hrs), Avg:98.2 F (36.8 C), Min:98.2 F (36.8 C), Max:98.2 F (36.8 C)  Recent Labs  Lab 11/28/22 1419 11/28/22 1422  WBC 10.4  --   CREATININE 1.19* 0.90    CrCl cannot be calculated (Unknown ideal weight.).    Allergies  Allergen Reactions   Aspirin Other (See Comments)    Reaction: Vasculitis per MD   Codeine Itching and Rash    Tolerated Norco 12/07/20 with no reports of rash/itching. Patient endorsed allergy >30 yr ago and hasn't had reaction since. Climb the walls per pt   Naproxen Nausea Only    Reaction: Makes stomach upset/irritated.   Sulfonamide Derivatives Itching and Rash    Antimicrobials this admission: 4/18 acyclovir >>  4/18 vancomycin >>  4/18 ampicillin >>  4/18 ceftriaxone >>   Dose adjustments this admission: N/A  Microbiology results: 4/18 CSF culture: needs to be collected    Thank you for allowing  pharmacy to be a part of this patient's care.  Cherylin Mylar 11/28/2022 4:10 PM

## 2022-11-29 ENCOUNTER — Inpatient Hospital Stay (HOSPITAL_COMMUNITY): Payer: Medicare Other

## 2022-11-29 DIAGNOSIS — B0230 Zoster ocular disease, unspecified: Secondary | ICD-10-CM

## 2022-11-29 DIAGNOSIS — G9340 Encephalopathy, unspecified: Secondary | ICD-10-CM | POA: Diagnosis not present

## 2022-11-29 LAB — CSF CULTURE W GRAM STAIN

## 2022-11-29 LAB — COMPREHENSIVE METABOLIC PANEL
ALT: 29 U/L (ref 0–44)
AST: 47 U/L — ABNORMAL HIGH (ref 15–41)
Albumin: 2.4 g/dL — ABNORMAL LOW (ref 3.5–5.0)
Alkaline Phosphatase: 115 U/L (ref 38–126)
Anion gap: 12 (ref 5–15)
BUN: 35 mg/dL — ABNORMAL HIGH (ref 8–23)
CO2: 24 mmol/L (ref 22–32)
Calcium: 8.6 mg/dL — ABNORMAL LOW (ref 8.9–10.3)
Chloride: 100 mmol/L (ref 98–111)
Creatinine, Ser: 0.98 mg/dL (ref 0.44–1.00)
GFR, Estimated: >60 mL/min (ref >60)
Glucose, Bld: 177 mg/dL — ABNORMAL HIGH (ref 70–99)
Potassium: 3.9 mmol/L (ref 3.5–5.1)
Sodium: 136 mmol/L (ref 135–145)
Total Bilirubin: 0.7 mg/dL (ref 0.3–1.2)
Total Protein: 6.3 g/dL — ABNORMAL LOW (ref 6.5–8.1)

## 2022-11-29 LAB — CBC
HCT: 46.2 % — ABNORMAL HIGH (ref 36.0–46.0)
Hemoglobin: 15.2 g/dL — ABNORMAL HIGH (ref 12.0–15.0)
MCH: 28.3 pg (ref 26.0–34.0)
MCHC: 32.9 g/dL (ref 30.0–36.0)
MCV: 86 fL (ref 80.0–100.0)
Platelets: 184 10*3/uL (ref 150–400)
RBC: 5.37 MIL/uL — ABNORMAL HIGH (ref 3.87–5.11)
RDW: 17 % — ABNORMAL HIGH (ref 11.5–15.5)
WBC: 12.1 10*3/uL — ABNORMAL HIGH (ref 4.0–10.5)
nRBC: 0 % (ref 0.0–0.2)

## 2022-11-29 LAB — BASIC METABOLIC PANEL
Anion gap: 10 (ref 5–15)
Anion gap: 13 (ref 5–15)
BUN: 41 mg/dL — ABNORMAL HIGH (ref 8–23)
BUN: 38 mg/dL — ABNORMAL HIGH (ref 8–23)
CO2: 25 mmol/L (ref 22–32)
CO2: 25 mmol/L (ref 22–32)
Calcium: 9 mg/dL (ref 8.9–10.3)
Calcium: 9 mg/dL (ref 8.9–10.3)
Chloride: 100 mmol/L (ref 98–111)
Chloride: 99 mmol/L (ref 98–111)
Creatinine, Ser: 1.15 mg/dL — ABNORMAL HIGH (ref 0.44–1.00)
Creatinine, Ser: 1.13 mg/dL — ABNORMAL HIGH (ref 0.44–1.00)
GFR, Estimated: 51 mL/min — ABNORMAL LOW (ref >60)
GFR, Estimated: 52 mL/min — ABNORMAL LOW (ref >60)
Glucose, Bld: 138 mg/dL — ABNORMAL HIGH (ref 70–99)
Glucose, Bld: 140 mg/dL — ABNORMAL HIGH (ref 70–99)
Potassium: 4.1 mmol/L (ref 3.5–5.1)
Potassium: 3.9 mmol/L (ref 3.5–5.1)
Sodium: 135 mmol/L (ref 135–145)
Sodium: 137 mmol/L (ref 135–145)

## 2022-11-29 LAB — GLUCOSE, CAPILLARY
Glucose-Capillary: 105 mg/dL — ABNORMAL HIGH (ref 70–99)
Glucose-Capillary: 130 mg/dL — ABNORMAL HIGH (ref 70–99)
Glucose-Capillary: 138 mg/dL — ABNORMAL HIGH (ref 70–99)
Glucose-Capillary: 138 mg/dL — ABNORMAL HIGH (ref 70–99)
Glucose-Capillary: 140 mg/dL — ABNORMAL HIGH (ref 70–99)
Glucose-Capillary: 142 mg/dL — ABNORMAL HIGH (ref 70–99)
Glucose-Capillary: 143 mg/dL — ABNORMAL HIGH (ref 70–99)
Glucose-Capillary: 149 mg/dL — ABNORMAL HIGH (ref 70–99)
Glucose-Capillary: 154 mg/dL — ABNORMAL HIGH (ref 70–99)
Glucose-Capillary: 201 mg/dL — ABNORMAL HIGH (ref 70–99)
Glucose-Capillary: 207 mg/dL — ABNORMAL HIGH (ref 70–99)
Glucose-Capillary: 223 mg/dL — ABNORMAL HIGH (ref 70–99)
Glucose-Capillary: 243 mg/dL — ABNORMAL HIGH (ref 70–99)

## 2022-11-29 LAB — CSF CELL COUNT WITH DIFFERENTIAL
Eosinophils, CSF: 0 % (ref 0–1)
Lymphs, CSF: 79 % (ref 40–80)
Monocyte-Macrophage-Spinal Fluid: 16 % (ref 15–45)
RBC Count, CSF: 13 /mm3 — ABNORMAL HIGH
Segmented Neutrophils-CSF: 5 % (ref 0–6)
Tube #: 1
WBC, CSF: 66 /mm3 (ref 0–5)

## 2022-11-29 LAB — PROTEIN AND GLUCOSE, CSF
Glucose, CSF: 92 mg/dL — ABNORMAL HIGH (ref 40–70)
Total  Protein, CSF: 100 mg/dL — ABNORMAL HIGH (ref 15–45)

## 2022-11-29 LAB — MENINGITIS/ENCEPHALITIS PANEL (CSF)
Haemophilus influenzae (CSF): NOT DETECTED
Herpes simplex virus 1 (CSF): NOT DETECTED
Streptococcus pneumoniae (CSF): NOT DETECTED

## 2022-11-29 MED ORDER — INSULIN ASPART 100 UNIT/ML IJ SOLN
0.0000 [IU] | Freq: Three times a day (TID) | INTRAMUSCULAR | Status: DC
  Administered 2022-11-29 (×2): 5 [IU] via SUBCUTANEOUS
  Administered 2022-11-30: 2 [IU] via SUBCUTANEOUS
  Administered 2022-11-30: 8 [IU] via SUBCUTANEOUS
  Administered 2022-11-30: 5 [IU] via SUBCUTANEOUS
  Administered 2022-12-01: 3 [IU] via SUBCUTANEOUS
  Administered 2022-12-01: 8 [IU] via SUBCUTANEOUS
  Administered 2022-12-01: 2 [IU] via SUBCUTANEOUS
  Administered 2022-12-02: 8 [IU] via SUBCUTANEOUS
  Administered 2022-12-02 – 2022-12-03 (×3): 3 [IU] via SUBCUTANEOUS
  Administered 2022-12-03 – 2022-12-04 (×3): 8 [IU] via SUBCUTANEOUS

## 2022-11-29 MED ORDER — LACTULOSE ENEMA
300.0000 mL | Freq: Once | ORAL | Status: AC
  Administered 2022-11-29: 300 mL via RECTAL
  Filled 2022-11-29: qty 300

## 2022-11-29 MED ORDER — LIDOCAINE HCL (PF) 1 % IJ SOLN
5.0000 mL | Freq: Once | INTRAMUSCULAR | Status: AC
  Administered 2022-11-29: 5 mL via INTRADERMAL

## 2022-11-29 MED ORDER — ERYTHROMYCIN 5 MG/GM OP OINT
TOPICAL_OINTMENT | Freq: Four times a day (QID) | OPHTHALMIC | Status: DC
  Filled 2022-11-29 (×3): qty 3.5

## 2022-11-29 MED ORDER — TRIFLURIDINE 1 % OP SOLN
1.0000 [drp] | OPHTHALMIC | Status: DC
  Administered 2022-11-29 – 2022-12-04 (×43): 1 [drp] via OPHTHALMIC
  Filled 2022-11-29 (×2): qty 0.1

## 2022-11-29 MED ORDER — OFLOXACIN 0.3 % OP SOLN
1.0000 [drp] | OPHTHALMIC | Status: DC
  Administered 2022-11-29 – 2022-12-04 (×32): 1 [drp] via OPHTHALMIC
  Filled 2022-11-29: qty 5

## 2022-11-29 MED ORDER — LACTATED RINGERS IV SOLN: INTRAVENOUS | Status: DC

## 2022-11-29 MED ORDER — INSULIN GLARGINE-YFGN 100 UNIT/ML ~~LOC~~ SOLN
13.0000 [IU] | SUBCUTANEOUS | Status: AC
  Administered 2022-11-29: 13 [IU] via SUBCUTANEOUS
  Filled 2022-11-29: qty 0.13

## 2022-11-29 MED ORDER — INSULIN GLARGINE-YFGN 100 UNIT/ML ~~LOC~~ SOLN: 5.0000 [IU] | Freq: Every day | SUBCUTANEOUS | Status: DC

## 2022-11-29 NOTE — Progress Notes (Signed)
Dr. Sunnie Nielsen informed via phone call of critical lab result.

## 2022-11-29 NOTE — Evaluation (Signed)
Clinical/Bedside Swallow Evaluation Patient Details  Name: Alexis [name] MRN: [id number] Date of Birth: 1950/09/26  Today's Date: 11/29/2022 Time: SLP Start Time (ACUTE ONLY): 1100 SLP Stop Time (ACUTE ONLY): 1113 SLP Time Calculation (min) (ACUTE ONLY): 13 min  Past Medical History:  Past Medical History:  Diagnosis Date   Anxiety    Arthritis    "knees" (05/25/2014)   Chronic back pain    Depression    Diabetes mellitus type II    Diabetic peripheral neuropathy 10/20/2018   Diverticulitis    Fatty liver    Fibromyalgia    Gastric polyp    hyperplastic   Gastroparesis    "recently dx'd" (05/25/2014)   GERD (gastroesophageal reflux disease)    H/O hiatal hernia    Hx of gastritis    Hyperlipidemia    Hypertension    Irritable bowel syndrome (IBS)    Memory difficulties 09/18/2017   Migraine without aura, without mention of intractable migraine without mention of status migrainosus    "related to allergies; have them in the spring and fall" (05/25/2014)   Obesity    Sleep apnea    "wore mask; took it off in my sleep; quit wearing it" (05/25/2014)   Uterine cancer dx'd 2000   surg only   Vasculitis    "irritates my legs"   Wears glasses    Past Surgical History:  Past Surgical History:  Procedure Laterality Date   ANTERIOR CERVICAL DECOMPRESSION/DISCECTOMY FUSION 4 LEVELS N/A 05/07/2018   Procedure: ANTERIOR CERVICAL DECOMPRESSION FUSION, CERVICAL 4-5,CERVICAL 5-6, CERVICAL 6-7 WITH INSTRUMENTATION AND ALLOGRAFT;  Surgeon: Estill Bamberg, MD;  Location: [facility] OR;  Service: Orthopedics;  Laterality: N/A;   APPENDECTOMY  ~ 1967   BIOPSY  07/27/2021   Procedure: BIOPSY;  Surgeon: Benancio Deeds, MD;  Location: [facility] ENDOSCOPY;  Service: Gastroenterology;;   BREAST CYST EXCISION Right 1990   COLONOSCOPY     COLONOSCOPY WITH PROPOFOL N/A 07/27/2021   Procedure: COLONOSCOPY WITH PROPOFOL;  Surgeon: Benancio Deeds, MD;  Location: [facility] ENDOSCOPY;  Service:  Gastroenterology;  Laterality: N/A;   ESOPHAGOGASTRODUODENOSCOPY (EGD) WITH PROPOFOL N/A 07/27/2021   Procedure: ESOPHAGOGASTRODUODENOSCOPY (EGD) WITH PROPOFOL;  Surgeon: Benancio Deeds, MD;  Location: [facility] ENDOSCOPY;  Service: Gastroenterology;  Laterality: N/A;   HEMOSTASIS CLIP PLACEMENT  07/27/2021   Procedure: HEMOSTASIS CLIP PLACEMENT;  Surgeon: Benancio Deeds, MD;  Location: [facility] ENDOSCOPY;  Service: Gastroenterology;;   LAPAROSCOPIC CHOLECYSTECTOMY  1990   LEFT HEART CATHETERIZATION WITH CORONARY ANGIOGRAM N/A 05/26/2014   Procedure: LEFT HEART CATHETERIZATION WITH CORONARY ANGIOGRAM;  Surgeon: Lennette Bihari, MD;  Location: [facility] CATH LAB;  Service: Cardiovascular;  Laterality: N/A;   POLYPECTOMY  07/27/2021   Procedure: POLYPECTOMY;  Surgeon: Benancio Deeds, MD;  Location: [facility] ENDOSCOPY;  Service: Gastroenterology;;   [name]  07/27/2021   Procedure: [name];  Surgeon: Benancio Deeds, MD;  Location[facility] ENDOSCOPY;  Service: Gastroenterology;;   SINUS SURGERY WITH INSTATRAK  2000   TOTAL HIP ARTHROPLASTY Left 12/05/2020   Procedure: TOTAL HIP ARTHROPLASTY ANTERIOR APPROACH;  Surgeon: Samson Frederic, MD;  Location: [facility] OR;  Service: Orthopedics;  Laterality: Left;   TUBAL LIGATION  ~ 1982   VAGINAL HYSTERECTOMY  2000   HPI:  Alexis [name] is a 72 y.o. female with medical history significant of diabetic type II, depression, Vasculitis, diabetic peripheral neuropathy, gastroparesis, migraine, sleep apnea noncompliant with CPAP, presents from home with altered mental status.  Per husband patient's have not been acting herself.  She  was  recently seen at a [facility] (on Monday)  and diagnosed with shingles.    Assessment / Plan / Recommendation  Clinical Impression  Pt demonstrates ability to follow simple commands though awareness arousal and communication are all impacted. When offered sips of water with verbal and tactial cues, pt takes straw sips without  signs of aspiration and accepts bites of puree easily without oral holding. When cued to take a small bite of cracker pt was not attentive and did not appropriately accept the cracker and it was removed from her mouth. Pt is not ready for solids, but could be offered sips of water and bites of puree or pudding from floor stock. MD/RN could advance diet if mentation improves and SLP is not readily available. Will plan to f/u for advancement as able. SLP Visit Diagnosis: Dysphagia, unspecified (R13.10)    Aspiration Risk  Mild aspiration risk    Diet Recommendation Thin liquid   Medication Administration: Whole meds with puree Supervision: Staff to assist with self feeding Compensations: Slow rate;Small sips/bites Postural Changes: Seated upright at 90 degrees    Other  Recommendations Oral Care Recommendations: Oral care BID    Recommendations for follow up therapy are one component of a multi-disciplinary discharge planning process, led by the attending physician.  Recommendations may be updated based on patient status, additional functional criteria and insurance authorization.  Follow up Recommendations No SLP follow up      Assistance Recommended at Discharge    Functional Status Assessment Patient has had a recent decline in their functional status and demonstrates the ability to make significant improvements in function in a reasonable and predictable amount of time.  Frequency and Duration min 2x/week          Prognosis        Swallow Study   General HPI: Alexis [name] is a 72 y.o. female with medical history significant of diabetic type II, depression, Vasculitis, diabetic peripheral neuropathy, gastroparesis, migraine, sleep apnea noncompliant with CPAP, presents from home with altered mental status.  Per husband patient's have not been acting herself.  She was  recently seen at a [facility] (on Monday)  and diagnosed with shingles. Type of Study: Bedside Swallow  Evaluation Previous Swallow Assessment: see HPI Respiratory Status: Room air History of Recent Intubation: No Behavior/Cognition: Alert;Cooperative;Pleasant mood Oral Cavity Assessment: Within Functional Limits Oral Care Completed by SLP: No Oral Cavity - Dentition: Adequate natural dentition Vision: Functional for self-feeding Self-Feeding Abilities: Total assist Patient Positioning: Upright in bed Baseline Vocal Quality: Normal Volitional Cough: Strong Volitional Swallow: Unable to elicit    Oral/Motor/Sensory Function Overall Oral Motor/Sensory Function: Within functional limits   Ice Chips     Thin Liquid Thin Liquid: Within functional limits Presentation: Straw;Cup    Nectar Thick Nectar Thick Liquid: Not tested   Honey Thick Honey Thick Liquid: Not tested   Puree Puree: Within functional limits Presentation: Spoon   Solid     Solid: Impaired Oral Phase Impairments: Poor awareness of bolus      Patrisia Faeth, Riley Nearing 11/29/2022,1:51 PM

## 2022-11-29 NOTE — Consult Note (Signed)
Neurology Consultation    Reason for Consult:AMS in the setting of zoster ophthalmicus. Requesting physician: Dr. Elna Breslow  HISTORY OF PRESENT ILLNESS   HPI  Alexis [name] is a 72 y.o. female with a past medical history of DMII, depression, neuropathy, gastroparesis, depression, OSA with noncompliance with cpap, and migraines.  She presented to the hospital on 11/28/22 with AMS.  On 11/25/22 she was seen at urgent care and diagnosed with shingles.  She then became confused at home on 11/26/22.  Ultimately brought in on 11/28/22 when this confusion and agitation continued.  ED noted hyperglycemia and insulin gtt initiated.  LP attempted but unsuccessful.  This was completed in IR today with studies pending.  She was initiated on acyclovir.  History is obtained from:chart review and husband at bedside.  Per her husband she has no baseline abnormalities or neurological dysfunction.  She was in her typical mental state on Monday, 11/25/22.  She is typically independent of ADLs.  ROS: Unable to obtain due to altered mental status.   PAST MEDICAL HISTORY    Past Medical History:  Past Medical History:  Diagnosis Date   Anxiety    Arthritis    "knees" (05/25/2014)   Chronic back pain    Depression    Diabetes mellitus type II    Diabetic peripheral neuropathy 10/20/2018   Diverticulitis    Fatty liver    Fibromyalgia    Gastric polyp    hyperplastic   Gastroparesis    "recently dx'd" (05/25/2014)   GERD (gastroesophageal reflux disease)    H/O hiatal hernia    Hx of gastritis    Hyperlipidemia    Hypertension    Irritable bowel syndrome (IBS)    Memory difficulties 09/18/2017   Migraine without aura, without mention of intractable migraine without mention of status migrainosus    "related to allergies; have them in the spring and fall" (05/25/2014)   Obesity    Sleep apnea    "wore mask; took it off in my sleep; quit wearing it" (05/25/2014)   Uterine cancer dx'd 2000   surg  only   Vasculitis    "irritates my legs"   Wears glasses     No family history on file. Family History  Problem Relation Age of Onset   Colon polyps Father    Heart disease Father    Breast cancer Sister    COPD Brother    Irritable bowel syndrome Daughter    Ovarian cancer Maternal Aunt    Stomach cancer Maternal Aunt    Diabetes Maternal Aunt    Colon cancer Maternal Uncle    Heart disease Maternal Uncle    Heart disease Other        Grandparents   Esophageal cancer Neg Hx    Pancreatic cancer Neg Hx     Allergies:  Allergies  Allergen Reactions   Aspirin Other (See Comments)    Reaction: Vasculitis per MD   Codeine Itching and Rash    Tolerated Norco 12/07/20 with no reports of rash/itching. Patient endorsed allergy >30 yr ago and hasn't had reaction since. Climb the walls per pt   Naproxen Nausea Only    Reaction: Makes stomach upset/irritated.   Sulfonamide Derivatives Itching and Rash    Social History:   reports that she has never smoked. She has never used smokeless tobacco. She reports that she does not drink alcohol and does not use drugs.    Medications Medications Prior to Admission  Medication Sig Dispense  Refill   amoxicillin-clavulanate (AUGMENTIN) 875-125 MG tablet Take 1 tablet by mouth 2 (two) times daily.     acetaminophen (TYLENOL) 325 MG tablet Take 2 tablets (650 mg total) by mouth every 6 (six) hours as needed for mild pain or moderate pain. (Patient taking differently: Take 325 mg by mouth every 6 (six) hours as needed for mild pain, moderate pain or headache.)     ACIPHEX 20 MG tablet Take 20 mg by mouth in the morning and at bedtime.     ALPRAZolam (XANAX) 0.5 MG tablet Take 0.5 mg by mouth at bedtime as needed.     amLODipine (NORVASC) 5 MG tablet Take 1 tablet (5 mg total) by mouth daily. 90 tablet 3   apixaban (ELIQUIS) 5 MG TABS tablet Take 1 tablet (5 mg total) by mouth 2 (two) times daily. 180 tablet 1   atorvastatin (LIPITOR) 20 MG  tablet Take 1 tablet (20 mg total) by mouth daily. 90 tablet 3   b complex vitamins tablet Take 1 tablet by mouth at bedtime.     CALCIUM PO Take 1 tablet by mouth at bedtime.     FARXIGA 10 MG TABS tablet Take 10 mg by mouth in the morning.     ferrous sulfate 325 (65 FE) MG tablet Take 1 tablet (325 mg total) by mouth every other day. 30 tablet 3   fish oil-omega-3 fatty acids 1000 MG capsule Take 1 g by mouth at bedtime.      furosemide (LASIX) 20 MG tablet Take 2 tablets (40 mg total) by mouth daily. 180 tablet 3   insulin lispro (HUMALOG) 100 UNIT/ML KwikPen Inject 8 Units into the skin 3 (three) times daily.     metoprolol succinate (TOPROL-XL) 50 MG 24 hr tablet Take 1 tablet (50 mg total) by mouth daily. Take with or immediately following a meal. (Patient taking differently: Take 100 mg by mouth in the morning and at bedtime. Take with or immediately following a meal.)     pantoprazole (PROTONIX) 40 MG tablet Take 1 tablet (40 mg total) by mouth 2 (two) times daily. 60 tablet 2   pregabalin (LYRICA) 50 MG capsule Take 50 mg by mouth 2 (two) times daily.     spironolactone (ALDACTONE) 25 MG tablet Take 0.5 tablets (12.5 mg total) by mouth daily. 45 tablet 1   TRESIBA FLEXTOUCH 100 UNIT/ML FlexTouch Pen Inject 18 Units into the skin at bedtime.     TRULICITY 1.5 MG/0.5ML SOPN Inject 1.5 mg into the skin every Sunday.     valsartan (DIOVAN) 160 MG tablet Take 1 tablet (160 mg total) by mouth daily. 30 tablet 3   venlafaxine XR (EFFEXOR-XR) 150 MG 24 hr capsule Take 150 mg by mouth 2 (two) times daily.     zaleplon (SONATA) 5 MG capsule Take 5 mg by mouth at bedtime.      EXAMINATION    Current vital signs:    11/29/2022    7:51 AM 11/29/2022    2:47 AM 11/29/2022    2:26 AM  Vitals with BMI  Systolic 156 142   Diastolic 67 67   Pulse 90 99 95    Examination: 1035 GENERAL: eyes closed.  Moaning. HEENT: - Normocephalic and atraumatic, crusting noted b/l eyes.  Redness to tissue R  eye, R forehead. LUNGS - equal chest rise; unlabored. ABDOMEN - Soft, nondistended.  NEURO:  Patient with eyes closed.  Does not follow commands.  Moans with any movement.  Yells out "  ouch" at times.   Pupils appear equal although difficult to examine as she is squeezing eyes shut against my evaluation.  Unable to evaluate visual fields or threat. No speech other than as noted. Face appears symmetric at rest. She withdrawals to painful stimulus throughout.  She is antigravity in RUE and sustains with coaching.  LUE falls to bed immediately when passively lifted. BLE with increased tone.  Again with slight antigravity RLE after passive raise.  LLE quickly falls back to bed with passive raise. There is normal tone and bulk BUE. There is increased tone and normal bulk BLE. When passive knee raise of BLE was performed and then extended patient moans loudly in pain seeming to indicate +Kernig's sign. Gait not able to be observed.   LABS   I have reviewed labs in epic and the results pertinent to this consultation are:  Lab Results  Component Value Date   LDLCALC 74 11/13/2022   Lab Results  Component Value Date   ALT 33 11/28/2022   AST 56 (H) 11/28/2022   ALKPHOS 140 (H) 11/28/2022   BILITOT 1.0 11/28/2022   Lab Results  Component Value Date   HGBA1C 8.8 (H) 08/18/2021   Lab Results  Component Value Date   WBC 10.4 11/28/2022   HGB 17.7 (H) 11/28/2022   HGB 17.7 (H) 11/28/2022   HCT 52.0 (H) 11/28/2022   HCT 52.0 (H) 11/28/2022   MCV 86.2 11/28/2022   PLT 270 11/28/2022   Lab Results  Component Value Date   VITAMINB12 1,011 (H) 07/25/2021   Lab Results  Component Value Date   FOLATE 20.7 07/25/2021   Lab Results  Component Value Date   NA 137 11/29/2022   K 3.9 11/29/2022   CL 99 11/29/2022   CO2 25 11/29/2022   Ammonia level 43 11/28/22   CSF studies: Opening pressure 18 Clear/colorless from documentation Glucose 92, Glucose capillary at time of procedure  143 Protein 100 WBC present, predominantly mononuclear with no organisms so far.  DIAGNOSTIC IMAGING/PROCEDURES   I have reviewed the images obtained, as below:   CT-head 11/28/22 No acute abnormality identified.  ASSESSMENT/PLAN    Assessment: 72 yo female with recent diagnosis of zoster ophthalmicus and new onset AMS.  Impression:AMS  Recommendations: According to [facility] one case of herpes zoster encephalitis occurs out of every 33-50k cases of shingles.   More at risk with immunocompromised state. - Continue to correct underlying metabolic abnormalities such as hyperglycemia- per primary team. Now off insulin gtt and receiving long acting and SSI coverage. - Initial culture prelim with WBC present from 0913 today but no organism. - Awaiting other results of LP from 0907 today: Meningitis/ encephalitis panel, protein, glucose, and cell count. - Agree with Acyclovir at this time which she is receiving at 10 mg/kg every 8 hours. - Try to avoid any sedating medications. - On empiric coverage of Vancomycin, Ampicillin 2g every 4 hours, and Rocephin CNS dosing at 2G IV every 12 hours. - Recommend MRI brain with contrast when able (order pending from 11/28/22)  According to an article In Clinical Infectious Diseases  VZV encephalitis is most common in elderly and immunocompromised patients.  Dx often delayed.  Risk factors for unfavorable outcomes include age, cerebral vasculitis and GCS less than 15.   Clinical Infectious Diseases, Volume 72, Issue 7, 11 November 2019, Pages [phone], [email or web address]   Patient seen and examined by NP/APP and neuro MD to see later. MD to update note as needed.  Leanord Hawking, AGACNP- [facility]   Addendum- meningitis panel + for Varicella Zoster. Antibiotics discontinued.  Acyclovir will continue.

## 2022-11-29 NOTE — Procedures (Signed)
Technically successful fluoro guided LP at L2-L3 level with opening pressure of 18 cm H2O. No closing pressure obtained.  11 cc of clear, colorless CSF sent to lab for analysis.  No immediate post procedural complication.  Please see imaging section of Epic for full dictation.    Alex Gardener, AGNP-BC 11/29/2022, 9:07 AM

## 2022-11-29 NOTE — Progress Notes (Addendum)
PROGRESS NOTE    Alexis [name]  ZOX:[id number] DOB: April 06, 1951 DOA: 11/28/2022 PCP: Fatima Sanger, FNP   Brief Narrative:  Alexis [name] is a 72 y.o. female with medical history significant of diabetic type II, depression, Vasculitis, diabetic peripheral neuropathy, gastroparesis, migraine, sleep apnea noncompliant with CPAP, presents from home with altered mental status.  Per husband patient's have not been acting herself.  She was  recently seen [facility] (on Monday)  and diagnosed with shingles.  She became confuse on Tuesday, she has been most of time in bed at home. She has been confuse, not making sense, agitated at times. He brought her to ED because she has become more confuse today.    She was noted to have blistering rash in the V1 distribution including the eye over the right side of her face on evaluation in the ED.   Evaluation  in the ED patient was found to have hyperglycemia, increased anion gap.  She was a started on insulin drip and IV fluids.  ED physician attempted LP and it was unsuccessful.     ED physician discussed with Dr. Vanessa Barbara with ophthalmology and recommendation was to treat for presumed zoster cold use ganciclovir drop if available.  Neurology has been also consulted and recommendation was for fluoroscopic guided LP with HSV and VZV and start acyclovir.     Assessment & Plan:   Principal Problem:   Encephalopathy acute Active Problems:   DM2 (diabetes mellitus, type 2)   Essential hypertension   Diabetic peripheral neuropathy   AF (paroxysmal atrial fibrillation)   Chronic diastolic CHF (congestive heart failure)   Herpes zoster   Uncontrolled type 2 diabetes mellitus with hyperglycemia, with long-term current use of insulin  1-Acute Metabolic Encephalopathy: -Concern for Viral encephalitis, HSV, VZV. Also in setting hyperglycemia. Covering for meningitis  -MRI pending.  -Neurology has been consulted. Dr Otelia Limes -LP ordered under  fluoroscopy/ Unsuccessful attempt by ED physician.  -Continue with IV  Acyclovir.  -Continue with  Ceftriaxone, Vancomycin and Ampicillin. Discontinue antibiotics depending LP results.  -ammonia 45. Will give her lactulose enema.   2-Zoster V 1 distribution.  -Continue with IV Acyclovir.  -4/18: ED physician discussed case with Dr Vanessa Barbara, who recommend ganciclovir drop if available if no IV acyclovir. No ganciclovir drop available.  -Discussed with Dr Vanessa Barbara 4/19: he recommend continue IV antiviral. Start Antibiotics ointment erythromycin and Ofloxacin. Pharmacy will also see which ophthalmic antiviral is available. Follow up out patient. Trifluridine is available, plan to start it.  -  3-Diabetes Type 2, Hyperglycemia, severe.  CBG 500, PH normal, Bicarb not low at 22. Gap was 16.  Treated with Insulin Gtt.  Received Semglee this am.  Continue with SSI.  Depending on CBG in next 24 hours, could consider start low dose semglee tomorrow.     4-Dehydration, AKI.  Prior cr 0.7. presents with cr at 1.1 Continue with IV fluids.    5-HTN; on metoprolol at home, plan to schedule IV.  PRN hydralazine.  Hold norvasc, lasix, spironolactone  while NPO.    6-Chronic Diastolic HF; compensated. Monitor for volume overload. Holding diuretics.  7-History of A fib: amiodarone stop by cardiology 08/2022 due to transaminases.  Eliquis on hold for LP./    Estimated body mass index is 30.77 kg/m as calculated from the following:   Height as of this encounter: 5\' 4"  (1.626 m).   Weight as of this encounter: 81.3 kg.   DVT prophylaxis: SCD Code  Status: Full code Family Communication: Husband updated at bedside.  Disposition Plan:  Status is: Inpatient Remains inpatient appropriate because: management of encephalopathy     Consultants:  Neurology Ophthalmology   Procedures:  LP  Antimicrobials//antivirals IV Acyclovir IV Vancomycin, ceftriaxone, ampicillin.   Subjective: Remain  encephalopathic, not following command, confuse, repeat same word.   Objective: Vitals:   11/29/22 0026 11/29/22 0200 11/29/22 0226 11/29/22 0247  BP:    (!) 142/67  Pulse: 93 93 95 99  Resp: (!) Temp:    98.8 F (37.1 C)  TempSrc:    Oral  SpO2: 100% 99% 98% 98%  Weight:      Height:       No intake or output data in the 24 hours ending 11/29/22 0708 Filed Weights   11/28/22 1600  Weight: 81.3 kg    Examination:  General exam: Appears calm and comfortable  Respiratory system: Clear to auscultation. Respiratory effort normal. Cardiovascular system: S1 & S2 heard, RRR. No JVD, murmurs, rubs, gallops or clicks. No pedal edema. Gastrointestinal system: Abdomen is nondistended, soft and nontender. No organomegaly or masses felt. Normal bowel sounds heard. Central nervous system: keep eyes close confuse, not following command, repeat same word o  Data Reviewed: I have personally reviewed following labs and imaging studies  CBC: Recent Labs  Lab 11/28/22 1419 11/28/22 1422  WBC 10.4  --   NEUTROABS 8.8*  --   HGB 16.8* 17.7*  17.7*  HCT 50.7* 52.0*  52.0*  MCV 86.2  --   PLT 270  --    Basic Metabolic Panel: Recent Labs  Lab 11/28/22 1419 11/28/22 1422 11/28/22 2035 11/29/22 0126 11/29/22 0459  NA 131* 130*  131* 134* 135 137  K 5.1 4.8  4.9 4.2 4.1 3.9  CL 92* 100 95* 100 99  CO2 22  --  GLUCOSE 474* 486* 329* 138* 140*  BUN 34* 40* 40* 41* 38*  CREATININE 1.19* 0.90 1.37* 1.15* 1.13*  CALCIUM 8.9  --  9.1 9.0 9.0   GFR: Estimated Creatinine Clearance: 47.1 mL/min (A) (by C-G formula based on SCr of 1.13 mg/dL (H)). Liver Function Tests: Recent Labs  Lab 11/28/22 1419  AST 56*  ALT 33  ALKPHOS 140*  BILITOT 1.0  PROT 7.1  ALBUMIN 2.7*   No results for input(s): "LIPASE", "AMYLASE" in the last 168 hours. Recent Labs  Lab 11/28/22 2035  AMMONIA 43*   Coagulation Profile: No results for input(s): "INR", "PROTIME" in  the last 168 hours. Cardiac Enzymes: No results for input(s): "CKTOTAL", "CKMB", "CKMBINDEX", "TROPONINI" in the last 168 hours. BNP (last 3 results) No results for input(s): "PROBNP" in the last 8760 hours. HbA1C: No results for input(s): "HGBA1C" in the last 72 hours. CBG: Recent Labs  Lab 11/29/22 0142 11/29/22 0250 11/29/22 0349 11/29/22 0505 11/29/22 0601  GLUCAP 142* 149* 138* 138* 130*   Lipid Profile: No results for input(s): "CHOL", "HDL", "LDLCALC", "TRIG", "CHOLHDL", "LDLDIRECT" in the last 72 hours. Thyroid Function Tests: No results for input(s): "TSH", "T4TOTAL", "FREET4", "T3FREE", "THYROIDAB" in the last 72 hours. Anemia Panel: No results for input(s): "VITAMINB12", "FOLATE", "FERRITIN", "TIBC", "IRON", "RETICCTPCT" in the last 72 hours. Sepsis Labs: No results for input(s): "PROCALCITON", "LATICACIDVEN" in the last 168 hours.  No results found for this or any previous visit (from the past 240 hour(s)).       Radiology Studies: DG Chest Portable 1 View  Result Date:  11/28/2022 CLINICAL DATA:  Altered mental status. EXAM: PORTABLE CHEST 1 VIEW COMPARISON:  06/04/2022 FINDINGS: Normal heart size. Stable mediastinal contours. Aortic atherosclerosis. No convincing pleural effusion, small pleural effusions on prior exam or seen only on the lateral view. No focal airspace disease, pulmonary edema, or pneumothorax. No acute osseous findings. IMPRESSION: No acute chest findings. Electronically Signed   By: Narda Rutherford M.D.   On: 11/28/2022 15:37   CT Head Wo Contrast  Result Date: 11/28/2022 CLINICAL DATA:  Headache, new onset. EXAM: CT HEAD WITHOUT CONTRAST TECHNIQUE: Contiguous axial images were obtained from the base of the skull through the vertex without intravenous contrast. RADIATION DOSE REDUCTION: This exam was performed according to the departmental dose-optimization program which includes automated exposure control, adjustment of the mA and/or kV  according to patient size and/or use of iterative reconstruction technique. COMPARISON:  Head CT 08/16/2022. FINDINGS: Brain: The inferior portion of the posterior fossa and foramen magnum are excluded from the coverage of the exam. Within this limitation, no acute intracranial hemorrhage. Gray-white differentiation is preserved. No hydrocephalus or extra-axial collection. No mass effect or midline shift. Vascular: No hyperdense vessel or unexpected calcification. Skull: No calvarial fracture or suspicious bone lesion. Skull base is unremarkable. Sinuses/Orbits: Unremarkable. Other: None. IMPRESSION: The inferior portion of the posterior fossa and foramen magnum are excluded from the coverage of the exam. Within this limitation, no acute intracranial abnormality. Electronically Signed   By: Orvan Falconer M.D.   On: 11/28/2022 14:54        Scheduled Meds:  insulin aspart  0-15 Units Subcutaneous TID WC   metoprolol tartrate  2.5 mg Intravenous Q8H   sodium chloride flush  3 mL Intravenous Q12H   Continuous Infusions:  sodium chloride 250 mL (11/29/22 0448)   acyclovir 655 mg (11/29/22 0135)   ampicillin (OMNIPEN) IV 2 g (11/29/22 0436)   cefTRIAXone (ROCEPHIN)  IV 2 g (11/28/22 2049)   dextrose 5% lactated ringers     insulin 1.9 Units/hr (11/29/22 0624)   lactated ringers 125 mL/hr at 11/29/22 0442   vancomycin       LOS: 1 day    Time spent: 35 minutes.     Alba Cory, MD [facility]   If 7PM-7AM, please contact night-coverage [email or web address]  11/29/2022, 7:08 AM

## 2022-11-29 NOTE — Progress Notes (Signed)
PHARMACY - PHYSICIAN COMMUNICATION  CRITICAL VALUE ALERT - Meningitis / Encephalitis Panel    Assessment: Lumbar puncture performed for concerns of meningitis / encephalitis.   Alexis [name] is an 72 y.o. female who presented to [facility] on 11/28/2022 with a chief complaint of AMS. Of note she was recently diagnosed with shingles on Monday.  VZV detected on [employer] ME panel. WBC elevated in CSF sample.   Name of physician (or Provider) Contacted: Dr. Sunnie Nielsen, Dr. Otelia Limes   Current anti-infectives: ampicillin + ceftriaxone + vancomycin +acyclovir   Changes to prescribed anti-infectives recommended: Stop all antibiotics, continue acyclovir   Recommendations accepted by provider  Results for orders placed or performed during the hospital encounter of 11/28/22  Meningitis/Encephalitis Panel (CSF) (Collected: 11/29/2022  9:14 AM)  Result Value Ref Range   Cryptococcus neoformans/gattii (CSF) NOT DETECTED NOT DETECTED   Cytomegalovirus (CSF) NOT DETECTED NOT DETECTED   Enterovirus (CSF) NOT DETECTED NOT DETECTED   Escherichia coli K1 (CSF) NOT DETECTED NOT DETECTED   Haemophilus influenzae (CSF) NOT DETECTED NOT DETECTED   Herpes simplex virus 1 (CSF) NOT DETECTED NOT DETECTED   Herpes simplex virus 2 (CSF) NOT DETECTED NOT DETECTED   Human herpesvirus 6 (CSF) NOT DETECTED NOT DETECTED   Human parechovirus (CSF) NOT DETECTED NOT DETECTED   Listeria monocytogenes (CSF) NOT DETECTED NOT DETECTED   Neisseria meningitis (CSF) NOT DETECTED NOT DETECTED   Streptococcus agalactiae (CSF) NOT DETECTED NOT DETECTED   Streptococcus pneumoniae (CSF) NOT DETECTED NOT DETECTED   Varicella zoster virus (CSF) (A) NOT DETECTED    CRITICAL RESULT CALLED TO, READ BACK BY AND VERIFIED WITH:    Jani Gravel, PharmD PGY-2 Infectious Diseases Resident  11/29/2022 12:17 PM

## 2022-11-30 ENCOUNTER — Inpatient Hospital Stay (HOSPITAL_COMMUNITY): Payer: Medicare Other

## 2022-11-30 DIAGNOSIS — G9340 Encephalopathy, unspecified: Secondary | ICD-10-CM | POA: Diagnosis not present

## 2022-11-30 LAB — BASIC METABOLIC PANEL
Anion gap: 13 (ref 5–15)
BUN: 22 mg/dL (ref 8–23)
CO2: 22 mmol/L (ref 22–32)
Calcium: 8.6 mg/dL — ABNORMAL LOW (ref 8.9–10.3)
Chloride: 99 mmol/L (ref 98–111)
Creatinine, Ser: 0.88 mg/dL (ref 0.44–1.00)
GFR, Estimated: >60 mL/min (ref >60)
Glucose, Bld: 260 mg/dL — ABNORMAL HIGH (ref 70–99)
Potassium: 3.6 mmol/L (ref 3.5–5.1)
Sodium: 134 mmol/L — ABNORMAL LOW (ref 135–145)

## 2022-11-30 LAB — MENINGITIS/ENCEPHALITIS PANEL (CSF)
Cryptococcus neoformans/gattii (CSF): NOT DETECTED
Cytomegalovirus (CSF): NOT DETECTED
Enterovirus (CSF): NOT DETECTED
Escherichia coli K1 (CSF): NOT DETECTED
Herpes simplex virus 2 (CSF): NOT DETECTED
Human herpesvirus 6 (CSF): NOT DETECTED
Human parechovirus (CSF): NOT DETECTED
Listeria monocytogenes (CSF): NOT DETECTED
Neisseria meningitis (CSF): NOT DETECTED
Streptococcus agalactiae (CSF): NOT DETECTED
Varicella zoster virus (CSF): DETECTED — AB

## 2022-11-30 LAB — VZV PCR, CSF: VZV PCR, CSF: POSITIVE

## 2022-11-30 LAB — HSV 1/2 PCR, CSF
HSV-1 DNA: NEGATIVE
HSV-2 DNA: NEGATIVE

## 2022-11-30 LAB — CSF CULTURE W GRAM STAIN

## 2022-11-30 LAB — HEMOGLOBIN A1C
Hgb A1c MFr Bld: 9.8 % — ABNORMAL HIGH (ref 4.8–5.6)
Mean Plasma Glucose: 234.56 mg/dL

## 2022-11-30 LAB — GLUCOSE, CAPILLARY
Glucose-Capillary: 263 mg/dL — ABNORMAL HIGH (ref 70–99)
Glucose-Capillary: 275 mg/dL — ABNORMAL HIGH (ref 70–99)
Glucose-Capillary: 228 mg/dL — ABNORMAL HIGH (ref 70–99)
Glucose-Capillary: 134 mg/dL — ABNORMAL HIGH (ref 70–99)
Glucose-Capillary: 166 mg/dL — ABNORMAL HIGH (ref 70–99)

## 2022-11-30 MED ORDER — LORAZEPAM 2 MG/ML IJ SOLN
2.0000 mg | Freq: Once | INTRAMUSCULAR | Status: AC
  Administered 2022-11-30: 1 mg via INTRAVENOUS
  Filled 2022-11-30: qty 1

## 2022-11-30 MED ORDER — AMLODIPINE BESYLATE 5 MG PO TABS
5.0000 mg | ORAL_TABLET | Freq: Every day | ORAL | Status: DC
  Administered 2022-11-30 – 2022-12-04 (×5): 5 mg via ORAL
  Filled 2022-11-30 (×5): qty 1

## 2022-11-30 MED ORDER — APIXABAN 5 MG PO TABS
5.0000 mg | ORAL_TABLET | Freq: Two times a day (BID) | ORAL | Status: DC
  Administered 2022-11-30 – 2022-12-04 (×9): 5 mg via ORAL
  Filled 2022-11-30 (×9): qty 1

## 2022-11-30 MED ORDER — GADOBUTROL 1 MMOL/ML IV SOLN
8.0000 mL | Freq: Once | INTRAVENOUS | Status: AC | PRN
  Administered 2022-11-30: 8 mL via INTRAVENOUS

## 2022-11-30 MED ORDER — METOPROLOL TARTRATE 25 MG PO TABS
25.0000 mg | ORAL_TABLET | Freq: Two times a day (BID) | ORAL | Status: DC
  Administered 2022-11-30 – 2022-12-03 (×7): 25 mg via ORAL
  Filled 2022-11-30 (×7): qty 1

## 2022-11-30 MED ORDER — INSULIN GLARGINE-YFGN 100 UNIT/ML ~~LOC~~ SOLN
5.0000 [IU] | Freq: Every day | SUBCUTANEOUS | Status: DC
  Administered 2022-11-30 – 2022-12-01 (×2): 5 [IU] via SUBCUTANEOUS
  Filled 2022-11-30 (×3): qty 0.05

## 2022-11-30 MED ORDER — CHLORHEXIDINE GLUCONATE CLOTH 2 % EX PADS
6.0000 | MEDICATED_PAD | Freq: Every day | CUTANEOUS | Status: DC
  Administered 2022-11-30 – 2022-12-04 (×5): 6 via TOPICAL

## 2022-11-30 MED ORDER — PREGABALIN 25 MG PO CAPS
50.0000 mg | ORAL_CAPSULE | Freq: Two times a day (BID) | ORAL | Status: DC
  Administered 2022-11-30 – 2022-12-02 (×5): 50 mg via ORAL
  Filled 2022-11-30 (×5): qty 2

## 2022-11-30 NOTE — Progress Notes (Signed)
PROGRESS NOTE  Alexis [name]  DOB: September 14, 1950  PCP: Fatima Sanger, [place] OZD:[id number]  DOA: 11/28/2022  LOS: 2 days  Hospital Day: 3  Brief narrative: Alexis [name] is a 72 y.o. female with PMH significant for DM2, HTN, HLD, diabetic neuropathy, gastroparesis, sleep apnea, migraine, anxiety/depression, fibromyalgia, GERD, IBS 4/18, patient was brought to the ED from home with altered mental status and high blood sugar level. She was  recently seen at an urgent care center on 4/15, diagnosed with shingles involving the V1 distribution including the right eye and right side of the face. She progressively became more confused, weak and agitated and hence brought to the ED ultimately.   In the ED, she was hemodynamically stable Labs showed high blood sugar level over 400 and increased anion gap VBG showed pH 7.42, pCO2 40 She was started on insulin drip and IV fluid ED physician attempted LP and it was unsuccessful.   ED physician discussed with Dr. Vanessa Barbara with ophthalmology and recommendation was to treat for presumed zoster with ganciclovir drop if available.   Neurology was consulted and recommendation was for fluoroscopic guided LP with HSV and VZV and start acyclovir. Admitted to [facility] 4/19, IR did LP at L2-L3 level, opening pressure 18 cm H2O.  11 cc of clear colorless CSF was sent.  CSF analysis showed WBC count elevated to 66, total protein elevated to 100  Subjective: Patient was seen and examined this morning.  Pleasant elderly Caucasian female.  Propped up in bed.  Alert, awake, slow to respond.  Oriented to self and place only.  Husband at bedside. Chart reviewed In the last 24 hours, no fever, heart rate in 100s, blood pressure fluctuating, 130s this morning Last set of blood work from this morning with sodium 134, glucose level running in 200s  Assessment and plan: Acute Metabolic Encephalopathy Suspect VZV meningitis/encephalitis Brought in from home with  altered mental status in the setting of right V1 dermatome shingles.   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 CSF panel abnormal with elevated WBC count elevated protein Viral serology positive for VZV. Currently on empiric treatment with IV antibiotics and antiviral  Pending MRI brain. Neurology consult called. Recent Labs  Lab 11/28/22 1419 11/29/22 1042  WBC 10.4 12.1*   Right V1 distribution shingles  Diagnosed on 4/15.   Continue with IV Acyclovir.  EDP and previous hospitalist discussed the case with ophthalmologist Dr Vanessa Barbara, who recommend ganciclovir drop if available, if no IV acyclovir. No ganciclovir drop available.  Currently on IV acyclovir. Also recommended Antibiotics ointment.  Started on Trifluridine per formulary.   Type 2 diabetes mellitus uncontrolled with hyperglycemia Diabetic neuropathy A1c 9.8 on 11/30/2022 Presented with blood glucose elevated to over 400. PTA on Farxiga 10 mg daily, Trulicity 1.5 mg every Sunday, Tresiba 18 units nightly, Humalog 8 units 3 times daily. Blood sugar level running elevated.  Start Semglee 5 units daily. Continue sliding scale insulin.   Was on Lyrica 50 mg twice daily. Resume the same.  Recent Labs  Lab 11/29/22 2128 11/29/22 2349 11/30/22 0401 11/30/22 0835 11/30/22 1238  GLUCAP 207* 223* 263* 275* 228*   AKI BUN/creatinine improving with hydration Recent Labs    06/04/22 1622 11/13/22 0946 11/28/22 1419 11/28/22 1422 11/28/22 2035 11/29/22 0126 11/29/22 0459 11/29/22 1042 11/30/22 0241  BUN 16 16 34* 40* 40* 41* 38* 35* 22  CREATININE 0.88 0.71 1.19* 0.90 1.37* 1.15* 1.13* 0.98 0.88   Chronic diastolic CHF Essential hypertension  PTA on Toprol  100 mg twice daily, valsartan 160 mg daily, Lasix 40 mg twice daily, Aldactone 12.5 mg daily, Norvasc 5 mg daily Most recent echo from 2022 with EF 65 to 70%, grade 2 diastolic dysfunction Heart rate and blood pressure elevated this morning.  Resume metoprolol at  25 mg twice daily.  Resume Norvasc 5 mg daily.  Keep others on hold for now.  History of A-fib Per report, amiodarone was stopped by cardiology on 08/2022 due to transaminases.  PTA on Toprol 100 mg twice daily Eliquis was held for LP.  Resume today  Elevated LFTs and ammonia History of fatty liver Mild elevation.   Check liver ultrasound to rule out liver cirrhosis Recent Labs  Lab 11/28/22 1419 11/28/22 2035 11/29/22 1042  AST 56*  --  47*  ALT 33  --  29  ALKPHOS 140*  --  115  BILITOT 1.0  --  0.7  PROT 7.1  --  6.3*  ALBUMIN 2.7*  --  2.4*  AMMONIA  --  43*  --   PLT 270  --  184   HLD Lipitor daily  Anxiety/depression/fibromyalgia Insomnia PTA on Effexor 150 mg twice daily, Xanax 0.5 mg nightly as needed, On zaleplon nightly for insomnia Currently all mood altering medications on hold.   Mobility: Encourage ambulation  Goals of care   Code Status: Full Code     DVT prophylaxis:  SCDs Start: 11/28/22 2017 apixaban (ELIQUIS) tablet 5 mg   Antimicrobials: Empiric IV antibiotics and antiviral Fluid: LR at 100 mill per hour Consultants: Neurology Family Communication: Husband at bedside  Status: Inpatient Level of care:  Progressive   Needs to continue in-hospital care:  Mental status remains altered.  Workup in progress.  Patient from: Home Anticipated d/c to: Pending clinical course    Diet:  Diet Order             DIET DYS 2 Room service appropriate? Yes; Fluid consistency: Thin  Diet effective now                   Scheduled Meds:  amLODipine  5 mg Oral Daily   apixaban  5 mg Oral BID   erythromycin   Right Eye Q6H   insulin aspart  0-15 Units Subcutaneous TID WC   insulin glargine-yfgn  5 Units Subcutaneous Daily   LORazepam  2 mg Intravenous Once   metoprolol tartrate  25 mg Oral BID   ofloxacin  1 drop Right Eye Q4H   pregabalin  50 mg Oral BID   sodium chloride flush  3 mL Intravenous Q12H   trifluridine  1 drop Right Eye  Q2H while awake    PRN meds: sodium chloride, acetaminophen **OR** acetaminophen, dextrose, hydrALAZINE, ondansetron **OR** ondansetron (ZOFRAN) IV, senna-docusate, sodium chloride flush   Infusions:   sodium chloride Stopped (11/29/22 1731)   acyclovir 655 mg (11/30/22 1323)   lactated ringers 100 mL/hr at 11/30/22 0905    Antimicrobials: Anti-infectives (From admission, onward)    Start     Dose/Rate Route Frequency Ordered Stop   11/29/22 0800  vancomycin (VANCOREADY) IVPB 750 mg/150 mL  Status:  Discontinued       See Hyperspace for full Linked Orders Report.   750 mg 150 mL/hr over 60 Minutes Intravenous Every 12 hours 11/28/22 1914 11/29/22 1332   11/28/22 2000  ampicillin (OMNIPEN) 2 g in sodium chloride 0.9 % 100 mL IVPB  Status:  Discontinued        2 g  300 mL/hr over 20 Minutes Intravenous Every 4 hours 11/28/22 1859 11/29/22 1332   11/28/22 2000  vancomycin (VANCOREADY) IVPB 1750 mg/350 mL       See Hyperspace for full Linked Orders Report.   1,750 mg 175 mL/hr over 120 Minutes Intravenous  Once 11/28/22 1914 11/29/22 0109   11/28/22 1915  cefTRIAXone (ROCEPHIN) 2 g in sodium chloride 0.9 % 100 mL IVPB  Status:  Discontinued        2 g 200 mL/hr over 30 Minutes Intravenous Every 12 hours 11/28/22 1858 11/29/22 1332   11/28/22 1730  acyclovir (ZOVIRAX) 655 mg in dextrose 5 % 100 mL IVPB        10 mg/kg  65.3 kg (Adjusted) 113.1 mL/hr over 60 Minutes Intravenous Every 8 hours 11/28/22 1655         Nutritional status:  Body mass index is 30.77 kg/m.          Objective: Vitals:   11/30/22 1109 11/30/22 1239  BP: (!) 154/62   Pulse: (!) 106   Resp:    Temp:  98.4 F (36.9 C)  SpO2:      Intake/Output Summary (Last 24 hours) at 11/30/2022 1430 Last data filed at 11/30/2022 1244 Gross per 24 hour  Intake 2012.09 ml  Output 425 ml  Net 1587.09 ml   Filed Weights   11/28/22 1600  Weight: 81.3 kg   Weight change:  Body mass index is 30.77 kg/m.    Physical Exam: General exam: Pleasant, elderly Caucasian female.  Propped up in bed. Skin: No rashes, lesions or ulcers. HEENT: Atraumatic, normocephalic, no obvious bleeding.  Shingles in the V1 dermatome involving the right eye.  Vision blurry on the right eye Lungs: Clear to auscultation bilaterally CVS: Tachycardic, regular rhythm, no murmur GI/Abd soft, nontender, nondistended, bowel sound present CNS: Alert, awake, oriented to self and place only.  Not restless or agitated. Psychiatry: Sad affect Extremities: No pedal edema, no calf tenderness  Data Review: I have personally reviewed the laboratory data and studies available.  F/u labs ordered Unresulted Labs (From admission, onward)     Start     Ordered   11/29/22 0913  Pathologist smear review  Once,   R        11/29/22 0913   11/29/22 0500  Basic metabolic panel  Daily,   R      11/28/22 1655   11/28/22 1730  HSV 1/2 PCR, CSF (reference lab) Cerebrospinal Fluid  Once,   URGENT        11/28/22 1729   11/28/22 1730  VZV PCR, CSF  Once,   URGENT        11/28/22 1729   11/28/22 1415  Urinalysis, w/ Reflex to Culture (Infection Suspected) -Urine, Clean Catch  Once,   URGENT       Question:  Specimen Source  Answer:  Urine, Clean Catch   11/28/22 1414            Total time spent in review of labs and imaging, patient evaluation, formulation of plan, documentation and communication with family: 55 minutes  Signed, Lorin Glass, MD [facility] 11/30/2022

## 2022-11-30 NOTE — Progress Notes (Signed)
Subjective: Improved level of alertness today.   Objective: Current vital signs: BP (!) 162/82   Pulse 80   Temp 98 F (36.7 C) (Oral)   Resp 16   Ht 5\' 4"  (1.626 m)   Wt 81.3 kg   SpO2 99%   BMI 30.77 kg/m  Vital signs in last 24 hours: Temp:  [98 F (36.7 C)-99.8 F (37.7 C)] 98 F (36.7 C) (04/20 1618) Pulse Rate:  [80-108] 80 (04/20 1800) Resp:  [12-29] 16 (04/20 1800) BP: (72-179)/(56-156) 162/82 (04/20 1800) SpO2:  [70 %-100 %] 99 % (04/20 1800)  Intake/Output from previous day: 04/19 0701 - 04/20 0700 In: 2012.1 [I.V.:1559.7; IV Piggyback:452.4] Out: -  Intake/Output this shift: Total I/O In: 1155 [I.V.:929; IV Piggyback:226] Out: 550 [Urine:550] Nutritional status:  Diet Order             DIET DYS 2 Room service appropriate? Yes; Fluid consistency: Thin  Diet effective now                  HEENT: Red rash in the right V1 distribution with periorbital swelling and lacrimation.  Lungs: Respirations unlabored Ext: Warm and well perfused   Neurologic Exam: Ment: Awake with mildly decreased level of alertness. Speech is fluent but sparse. Comprehension intact. Poor orientation. Mild to moderate confusion. Frequently perseverates verbally.  CN: PERRL. Visual fields intact with no extinction to DSS. EOMI. Smile is symmetric. Phonation intact. Head is midline.  Motor: 4+/5 x 4 without asymmetry No pronator drift.  Sensory: Intact to FT x 4.  Reflexes: Normoactive Cerebellar: No ataxia with FNF bilaterally Gait: Deferred  Lab Results: Results for orders placed or performed during the hospital encounter of 11/28/22 (from the past 48 hour(s))  CBG monitoring, ED     Status: Abnormal   Collection Time: 11/28/22  6:47 PM  Result Value Ref Range   Glucose-Capillary 482 (H) 70 - 99 mg/dL    Comment: Glucose reference range applies only to samples taken after fasting for at least 8 hours.  CBG monitoring, ED     Status: Abnormal   Collection Time: 11/28/22   7:18 PM  Result Value Ref Range   Glucose-Capillary 374 (H) 70 - 99 mg/dL    Comment: Glucose reference range applies only to samples taken after fasting for at least 8 hours.  Basic metabolic panel     Status: Abnormal   Collection Time: 11/28/22  8:35 PM  Result Value Ref Range   Sodium 134 (L) 135 - 145 mmol/L   Potassium 4.2 3.5 - 5.1 mmol/L   Chloride 95 (L) 98 - 111 mmol/L   CO2 22 22 - 32 mmol/L   Glucose, Bld 329 (H) 70 - 99 mg/dL    Comment: Glucose reference range applies only to samples taken after fasting for at least 8 hours.   BUN 40 (H) 8 - 23 mg/dL   Creatinine, Ser 1.61 (H) 0.44 - 1.00 mg/dL   Calcium 9.1 8.9 - 09.6 mg/dL   GFR, Estimated 41 (L) >60 mL/min    Comment: (NOTE) Calculated using the CKD-EPI Creatinine Equation (2021)    Anion gap 17 (H) 5 - 15    Comment: Performed at [facility], [address], [place], [place] [address]  Ammonia     Status: Abnormal   Collection Time: 11/28/22  8:35 PM  Result Value Ref Range   Ammonia 43 (H) 9 - 35 umol/L    Comment: HEMOLYSIS AT THIS LEVEL MAY  AFFECT RESULT Performed at [facility], [address] [place], [place] [address]   Glucose, capillary     Status: Abnormal   Collection Time: 11/28/22  8:55 PM  Result Value Ref Range   Glucose-Capillary 302 (H) 70 - 99 mg/dL    Comment: Glucose reference range applies only to samples taken after fasting for at least 8 hours.  Glucose, capillary     Status: Abnormal   Collection Time: 11/28/22 10:25 PM  Result Value Ref Range   Glucose-Capillary 248 (H) 70 - 99 mg/dL    Comment: Glucose reference range applies only to samples taken after fasting for at least 8 hours.  Glucose, capillary     Status: Abnormal   Collection Time: 11/28/22 11:44 PM  Result Value Ref Range   Glucose-Capillary 181 (H) 70 - 99 mg/dL    Comment: Glucose reference range applies only to samples taken after fasting for at least 8 hours.  Glucose, capillary     Status: Abnormal    Collection Time: 11/29/22 12:40 AM  Result Value Ref Range   Glucose-Capillary 154 (H) 70 - 99 mg/dL    Comment: Glucose reference range applies only to samples taken after fasting for at least 8 hours.  Basic metabolic panel     Status: Abnormal   Collection Time: 11/29/22  1:26 AM  Result Value Ref Range   Sodium 135 135 - 145 mmol/L   Potassium 4.1 3.5 - 5.1 mmol/L   Chloride 100 98 - 111 mmol/L   CO2 25 22 - 32 mmol/L   Glucose, Bld 138 (H) 70 - 99 mg/dL    Comment: Glucose reference range applies only to samples taken after fasting for at least 8 hours.   BUN 41 (H) 8 - 23 mg/dL   Creatinine, Ser 6.04 (H) 0.44 - 1.00 mg/dL   Calcium 9.0 8.9 - 54.0 mg/dL   GFR, Estimated 51 (L) >60 mL/min    Comment: (NOTE) Calculated using the CKD-EPI Creatinine Equation (2021)    Anion gap 10 5 - 15    Comment: Performed at [facility], [address] [place], [place] [address]  Glucose, capillary     Status: Abnormal   Collection Time: 11/29/22  1:42 AM  Result Value Ref Range   Glucose-Capillary 142 (H) 70 - 99 mg/dL    Comment: Glucose reference range applies only to samples taken after fasting for at least 8 hours.  Glucose, capillary     Status: Abnormal   Collection Time: 11/29/22  2:50 AM  Result Value Ref Range   Glucose-Capillary 149 (H) 70 - 99 mg/dL    Comment: Glucose reference range applies only to samples taken after fasting for at least 8 hours.  Glucose, capillary     Status: Abnormal   Collection Time: 11/29/22  3:49 AM  Result Value Ref Range   Glucose-Capillary 138 (H) 70 - 99 mg/dL    Comment: Glucose reference range applies only to samples taken after fasting for at least 8 hours.  Basic metabolic panel     Status: Abnormal   Collection Time: 11/29/22  4:59 AM  Result Value Ref Range   Sodium 137 135 - 145 mmol/L   Potassium 3.9 3.5 - 5.1 mmol/L   Chloride 99 98 - 111 mmol/L   CO2 25 22 - 32 mmol/L   Glucose, Bld 140 (H) 70 - 99 mg/dL    Comment:  Glucose reference range applies only to samples taken after  fasting for at least 8 hours.   BUN 38 (H) 8 - 23 mg/dL   Creatinine, Ser 1.61 (H) 0.44 - 1.00 mg/dL   Calcium 9.0 8.9 - 09.6 mg/dL   GFR, Estimated 52 (L) >60 mL/min    Comment: (NOTE) Calculated using the CKD-EPI Creatinine Equation (2021)    Anion gap 13 5 - 15    Comment: Performed at [facility], [address] [place], [place] [address]  Glucose, capillary     Status: Abnormal   Collection Time: 11/29/22  5:05 AM  Result Value Ref Range   Glucose-Capillary 138 (H) 70 - 99 mg/dL    Comment: Glucose reference range applies only to samples taken after fasting for at least 8 hours.  Glucose, capillary     Status: Abnormal   Collection Time: 11/29/22  6:01 AM  Result Value Ref Range   Glucose-Capillary 130 (H) 70 - 99 mg/dL    Comment: Glucose reference range applies only to samples taken after fasting for at least 8 hours.  Glucose, capillary     Status: Abnormal   Collection Time: 11/29/22  7:07 AM  Result Value Ref Range   Glucose-Capillary 140 (H) 70 - 99 mg/dL    Comment: Glucose reference range applies only to samples taken after fasting for at least 8 hours.  Glucose, capillary     Status: Abnormal   Collection Time: 11/29/22  7:54 AM  Result Value Ref Range   Glucose-Capillary 105 (H) 70 - 99 mg/dL    Comment: Glucose reference range applies only to samples taken after fasting for at least 8 hours.  CSF cell count with differential     Status: Abnormal   Collection Time: 11/29/22  9:13 AM  Result Value Ref Range   Tube # 1    Color, CSF COLORLESS COLORLESS   Appearance, CSF CLEAR CLEAR   Supernatant NOT INDICATED    RBC Count, CSF 13 (H) 0 /cu mm   WBC, CSF 66 (HH) 0 - 5 /cu mm    Comment: CRITICAL RESULT CALLED TO, READ BACK BY AND VERIFIED WITH: R.FRAIL RN 1133 11/29/22 MCCORMICK K    Segmented Neutrophils-CSF 5 0 - 6 %   Lymphs, CSF 79 40 - 80 %   Monocyte-Macrophage-Spinal Fluid 16 15 - 45 %    Eosinophils, CSF 0 0 - 1 %    Comment: Performed at [facility], [address], [place], [place] [address]  CSF culture w Gram Stain     Status: None (Preliminary result)   Collection Time: 11/29/22  9:13 AM   Specimen: PATH Cytology CSF; Cerebrospinal Fluid  Result Value Ref Range   Specimen Description CSF    Special Requests NONE    Gram Stain      WBC PRESENT, PREDOMINANTLY MONONUCLEAR NO ORGANISMS SEEN CYTOSPIN SMEAR    Culture      NO GROWTH 1 DAY Performed at [facility], [address], [place], [place] [address]    Report Status PENDING   Protein and glucose, CSF     Status: Abnormal   Collection Time: 11/29/22  9:13 AM  Result Value Ref Range   Glucose, CSF 92 (H) 40 - 70 mg/dL   Total  Protein, CSF 956 (H) 15 - 45 mg/dL    Comment: Performed at [facility], [address] [place], [place] [address]  Meningitis/Encephalitis Panel (CSF)     Status: Abnormal   Collection Time: 11/29/22  9:14 AM  Result Value Ref Range   Cryptococcus neoformans/gattii (CSF) NOT DETECTED NOT DETECTED    Comment: (NOTE) Patients with a suspicion of cryptococcal meningitis should be tested  for cryptococcal antigen (CrAg).      Cytomegalovirus (CSF) NOT DETECTED NOT DETECTED   Enterovirus (CSF) NOT DETECTED NOT DETECTED   Escherichia coli K1 (CSF) NOT DETECTED NOT DETECTED    Comment: (NOTE) Only E. coli strains possessing the K1 capsular antigen will be detected.      Haemophilus influenzae (CSF) NOT DETECTED NOT DETECTED   Herpes simplex virus 1 (CSF) NOT DETECTED NOT DETECTED   Herpes simplex virus 2 (CSF) NOT DETECTED NOT DETECTED   Human herpesvirus 6 (CSF) NOT DETECTED NOT DETECTED   Human parechovirus (CSF) NOT DETECTED NOT DETECTED   Listeria monocytogenes (CSF) NOT DETECTED NOT DETECTED   Neisseria meningitis (CSF) NOT DETECTED NOT DETECTED    Comment: (NOTE) Only encapsulated strains of N. meningitidis will be detected.     Streptococcus  agalactiae (CSF) NOT DETECTED NOT DETECTED   Streptococcus pneumoniae (CSF) NOT DETECTED NOT DETECTED   Varicella zoster virus (CSF) DETECTED (A) NOT DETECTED    Comment: CRITICAL RESULT CALLED TO, READ BACK BY AND VERIFIED WITH: PHARMD L.BILL AT 1143 ON 11/29/2022 BY T.SAAD. Performed at [facility], [address] [place], [place] [address] CORRECTED ON 04/20 AT 1404: PREVIOUSLY REPORTED AS CRITICAL RESULT CALLED TO, READ BACK BY AND VERIFIED WITH: PHARMD L.BILL AT 1143 ON 11/29/2022 BY T.SAAD.   Glucose, capillary     Status: Abnormal   Collection Time: 11/29/22 10:09 AM  Result Value Ref Range   Glucose-Capillary 143 (H) 70 - 99 mg/dL    Comment: Glucose reference range applies only to samples taken after fasting for at least 8 hours.  CBC     Status: Abnormal   Collection Time: 11/29/22 10:42 AM  Result Value Ref Range   WBC 12.1 (H) 4.0 - 10.5 K/uL   RBC 5.37 (H) 3.87 - 5.11 MIL/uL   Hemoglobin 15.2 (H) 12.0 - 15.0 g/dL   HCT 60.4 (H) 54.0 - 98.1 %   MCV 86.0 80.0 - 100.0 fL   MCH 28.3 26.0 - 34.0 pg   MCHC 32.9 30.0 - 36.0 g/dL   RDW 19.1 (H) 47.8 - 29.5 %   Platelets 184 150 - 400 K/uL   nRBC 0.0 0.0 - 0.2 %    Comment: Performed at [facility], [address], [place], [place] [address]  Comprehensive metabolic panel     Status: Abnormal   Collection Time: 11/29/22 10:42 AM  Result Value Ref Range   Sodium 136 135 - 145 mmol/L   Potassium 3.9 3.5 - 5.1 mmol/L   Chloride 100 98 - 111 mmol/L   CO2 24 22 - 32 mmol/L   Glucose, Bld 177 (H) 70 - 99 mg/dL    Comment: Glucose reference range applies only to samples taken after fasting for at least 8 hours.   BUN 35 (H) 8 - 23 mg/dL   Creatinine, Ser 8.65 0.44 - 1.00 mg/dL   Calcium 8.6 (L) 8.9 - 10.3 mg/dL   Total Protein 6.3 (L) 6.5 - 8.1 g/dL   Albumin 2.4 (L) 3.5 - 5.0 g/dL   AST 47 (H) 15 - 41 U/L   ALT 29 0 - 44 U/L   Alkaline Phosphatase 115 38 - 126 U/L   Total Bilirubin 0.7 0.3 - 1.2 mg/dL   GFR,  Estimated >  60 >60 mL/min    Comment: (NOTE) Calculated using the CKD-EPI Creatinine Equation (2021)    Anion gap 12 5 - 15    Comment: Performed at [facility], [address], [place], [place] [address]  Glucose, capillary     Status: Abnormal   Collection Time: 11/29/22 12:24 PM  Result Value Ref Range   Glucose-Capillary 201 (H) 70 - 99 mg/dL    Comment: Glucose reference range applies only to samples taken after fasting for at least 8 hours.  Glucose, capillary     Status: Abnormal   Collection Time: 11/29/22  4:56 PM  Result Value Ref Range   Glucose-Capillary 243 (H) 70 - 99 mg/dL    Comment: Glucose reference range applies only to samples taken after fasting for at least 8 hours.  Glucose, capillary     Status: Abnormal   Collection Time: 11/29/22  9:28 PM  Result Value Ref Range   Glucose-Capillary 207 (H) 70 - 99 mg/dL    Comment: Glucose reference range applies only to samples taken after fasting for at least 8 hours.  Glucose, capillary     Status: Abnormal   Collection Time: 11/29/22 11:49 PM  Result Value Ref Range   Glucose-Capillary 223 (H) 70 - 99 mg/dL    Comment: Glucose reference range applies only to samples taken after fasting for at least 8 hours.  Basic metabolic panel     Status: Abnormal   Collection Time: 11/30/22  2:41 AM  Result Value Ref Range   Sodium 134 (L) 135 - 145 mmol/L   Potassium 3.6 3.5 - 5.1 mmol/L   Chloride 99 98 - 111 mmol/L   CO2 22 22 - 32 mmol/L   Glucose, Bld 260 (H) 70 - 99 mg/dL    Comment: Glucose reference range applies only to samples taken after fasting for at least 8 hours.   BUN 22 8 - 23 mg/dL   Creatinine, Ser 6.04 0.44 - 1.00 mg/dL   Calcium 8.6 (L) 8.9 - 10.3 mg/dL   GFR, Estimated >54 >09 mL/min    Comment: (NOTE) Calculated using the CKD-EPI Creatinine Equation (2021)    Anion gap 13 5 - 15    Comment: Performed at [facility], [address], [place], [place] [address]  Glucose, capillary      Status: Abnormal   Collection Time: 11/30/22  4:01 AM  Result Value Ref Range   Glucose-Capillary 263 (H) 70 - 99 mg/dL    Comment: Glucose reference range applies only to samples taken after fasting for at least 8 hours.  Glucose, capillary     Status: Abnormal   Collection Time: 11/30/22  8:35 AM  Result Value Ref Range   Glucose-Capillary 275 (H) 70 - 99 mg/dL    Comment: Glucose reference range applies only to samples taken after fasting for at least 8 hours.  Hemoglobin A1c     Status: Abnormal   Collection Time: 11/30/22  9:59 AM  Result Value Ref Range   Hgb A1c MFr Bld 9.8 (H) 4.8 - 5.6 %    Comment: (NOTE) Pre diabetes:          5.7%-6.4%  Diabetes:              >6.4%  Glycemic control for   <7.0% adults with diabetes    Mean Plasma Glucose 234.56 mg/dL    Comment: Performed at [facility], [address], [place], [place] [address]  Glucose,  capillary     Status: Abnormal   Collection Time: 11/30/22 12:38 PM  Result Value Ref Range   Glucose-Capillary 228 (H) 70 - 99 mg/dL    Comment: Glucose reference range applies only to samples taken after fasting for at least 8 hours.  Glucose, capillary     Status: Abnormal   Collection Time: 11/30/22  4:16 PM  Result Value Ref Range   Glucose-Capillary 134 (H) 70 - 99 mg/dL    Comment: Glucose reference range applies only to samples taken after fasting for at least 8 hours.    Recent Results (from the past 240 hour(s))  VZV PCR, CSF     Status: None   Collection Time: 11/28/22  3:48 PM   Specimen: Cerebrospinal Fluid  Result Value Ref Range Status   VZV PCR, CSF Positive Negative Final    Comment: CRITICAL RESULT CALLED TO, READ BACK BY AND VERIFIED WITH: T,KRAMER RN @1406  11/30/22 E,BENTON (NOTE) Varicella Zoster Virus DNA detected. Called/faxed to [facility], LAB on 11/30/2022 at 14:06 ET for test(s) VZV PCR, CSF Performed At: [facility] [address] [place], [place] [id number] Jolene Schimke MD  WU:[id number] Performed at [facility], [address], [place], [place] [address]   CSF culture w Gram Stain     Status: None (Preliminary result)   Collection Time: 11/29/22  9:13 AM   Specimen: PATH Cytology CSF; Cerebrospinal Fluid  Result Value Ref Range Status   Specimen Description CSF  Final   Special Requests NONE  Final   Gram Stain   Final    WBC PRESENT, PREDOMINANTLY MONONUCLEAR NO ORGANISMS SEEN CYTOSPIN SMEAR    Culture   Final    NO GROWTH 1 DAY Performed at [facility], [address] [place], [place] [address]    Report Status PENDING  Incomplete    Lipid Panel No results for input(s): "CHOL", "TRIG", "HDL", "CHOLHDL", "VLDL", "LDLCALC" in the last 72 hours.  Studies/Results: MR BRAIN W WO CONTRAST  Result Date: 11/30/2022 CLINICAL DATA:  Deficit, acute, stroke suspected. New onset headache. EXAM: MRI HEAD WITHOUT AND WITH CONTRAST TECHNIQUE: Multiplanar, multiecho pulse sequences of the brain and surrounding structures were obtained without and with intravenous contrast. CONTRAST:  8mL GADAVIST GADOBUTROL 1 MMOL/ML IV SOLN COMPARISON:  CT head without contrast 11/28/2022 FINDINGS: Brain: No acute infarct, hemorrhage, or mass lesion is present. Periventricular white matter changes are mildly advanced for age. T2 hyperintensities extend into the brainstem. The ventricles are of normal size. No significant extraaxial fluid collection is present. Deep brain nuclei are within normal limits. The brainstem and cerebellum are within normal limits. Midline structures are within normal limits. Postcontrast images demonstrate no pathologic enhancement. Vascular: Flow is present in the major intracranial arteries. Skull and upper cervical spine: The craniocervical junction is normal. Upper cervical spine is within normal limits. Marrow signal is unremarkable. Sinuses/Orbits: The paranasal sinuses and mastoid air cells are clear. The globes and orbits are within  normal limits. IMPRESSION: 1. No acute intracranial abnormality. 2. Periventricular white matter changes are mildly advanced for age. The finding is nonspecific but can be seen in the setting of chronic microvascular ischemia, a demyelinating process such as multiple sclerosis, vasculitis, complicated migraine headaches, or as the sequelae of a prior infectious or inflammatory process. Electronically Signed   By: Marin Roberts M.D.   On: 11/30/2022 16:01   US Abdomen Limited RUQ (LIVER/GB)  Result Date: 11/30/2022 CLINICAL DATA:  Elevated LFTs EXAM: ULTRASOUND ABDOMEN  LIMITED RIGHT UPPER QUADRANT COMPARISON:  None Available. FINDINGS: Gallbladder: Gallbladder is surgically absent. Common bile duct: Diameter: 4 mm, which is normal. Liver: No focal lesion identified. Increased parenchymal echogenicity. Portal vein is patent on color Doppler imaging with normal direction of blood flow towards the liver. Other: None. IMPRESSION: Hepatic steatosis.  No sonographic finding to explain elevated LFTs. Electronically Signed   By: Lorenza Cambridge M.D.   On: 11/30/2022 11:55   DG FL GUIDED LUMBAR PUNCTURE  Result Date: 11/29/2022 CLINICAL DATA:  Patient admitted for acute altered mental status with active zoster. Request received to perform diagnostic fluoroscopic guided lumbar puncture to evaluate for encephalitis. EXAM: DIAGNOSTIC LUMBAR PUNCTURE UNDER FLUOROSCOPIC GUIDANCE COMPARISON:  CT head 11/28/2022 FLUOROSCOPY: Radiation Exposure Index (as provided by the fluoroscopic device): 13.40 mGy Kerma PROCEDURE: Informed consent was obtained from the patient's daughter prior to the procedure, including potential complications of headache, allergy, and pain. With the patient prone, the lower back was prepped with Betadine. 1% Lidocaine was used for local anesthesia. Lumbar puncture was performed at the L2-L3 level using a 20 gauge needle with return of clear, colorless CSF with an opening pressure of 18 cm water. 11  ml of CSF were obtained for laboratory studies. The patient tolerated the procedure well and there were no apparent complications. Procedure was performed by Alex Gardener, NP and supervised by Sebastian Ache, MD. IMPRESSION: Technically successful fluoroscopic guided lumbar puncture. Read by: Alex Gardener, AGNP-BC Electronically Signed   By: Sebastian Ache M.D.   On: 11/29/2022 09:38    Medications: Scheduled:  amLODipine  5 mg Oral Daily   apixaban  5 mg Oral BID   Chlorhexidine Gluconate Cloth  6 each Topical Daily   erythromycin   Right Eye Q6H   insulin aspart  0-15 Units Subcutaneous TID WC   insulin glargine-yfgn  5 Units Subcutaneous Daily   metoprolol tartrate  25 mg Oral BID   ofloxacin  1 drop Right Eye Q4H   pregabalin  50 mg Oral BID   sodium chloride flush  3 mL Intravenous Q12H   trifluridine  1 drop Right Eye Q2H while awake   Continuous:  sodium chloride Stopped (11/29/22 1731)   acyclovir Stopped (11/30/22 1423)   lactated ringers Stopped (11/30/22 1433)   Assessment: 72 yo female with recent diagnosis of zoster ophthalmicus and new onset AMS. - Exam reveals improvement in her mentation since yesterday, but still disoriented and with significant confusion. Left sided weakness noted yesterday is now resolved.  - CSF: - Elevated WBC predominantly mononuclear, elevated protein and positive VZV PCR - Initial culture prelim with no bacterial/fungal organism. - Meningitis/ encephalitis panel shows no evidence for bacterial meningitis. 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  - Literature search:  - According to an article In Clinical Infectious Diseases  VZV encephalitis is most common in elderly and immunocompromised patients.  Dx often delayed.  Risk factors for unfavorable outcomes include age, cerebral vasculitis and GCS less than 15 (Clinical Infectious Diseases, Volume 72, Issue 7, 11 November 2019, Pages [phone], [employer]) - According to [facility] one case of herpes zoster encephalitis occurs out of every 33-50k cases of shingles. More at risk with immunocompromised state. She does have diabetes.    Recommendations:  - Continue to correct underlying metabolic abnormalities such as hyperglycemia - per primary team. Now off insulin gtt and receiving long acting and SSI coverage. - Agree with  Acyclovir at this time which she is receiving at 10 mg/kg every 8 hours. Will need to complete a 14 day course. Monitor renal function while on acyclovir.  - Try to avoid any sedating medications. - Recommend MRI brain with and without contrast   Addendum: MRI brain with and without contrast: No acute intracranial abnormality. Periventricular white matter changes are mildly advanced for age; the finding is nonspecific but can be seen in the setting of chronic microvascular ischemia, a demyelinating process such as multiple sclerosis, vasculitis, complicated migraine headaches, or as the sequelae of a prior infectious or inflammatory process.     LOS: 2 days   @Electronically  signed: Dr. Caryl Pina 11/30/2022  6:43 PM

## 2022-11-30 NOTE — Progress Notes (Signed)
Speech Language Pathology Treatment: Dysphagia  Patient Details Name: Alexis [name] MRN: [id number] DOB: 1951/06/17 Today's Date: 11/30/2022 Time: 4098-1191 SLP Time Calculation (min) (ACUTE ONLY): 15 min  Assessment / Plan / Recommendation Clinical Impression  Pt seen for skilled ST services upright in the bed to address tolerance with PO trials. The pt was able to answer direct questions and required minimal cueing to attend PO trials. The pt was given x2 spoonfuls of thin liquid via spoon with full assist from SLP and x1 straw sip with assist from SLP with no overt s/s of aspiration. The pt was unable to do a cup sip or put the straw into her oral cavity independently. The pt was given x2 spoonfuls of puree given SLP assist, pt unable to do so independently attempting to sip the spoon. The pt had no overt s/s of aspiration with puree. The pt consumed regular solids with prolonged mastication with no overt s/s of aspiration. The SLP observed reduced hyolaryngeal excursion with all PO trials. Given the upgrade in mentation status and success with PO trials, safest diet recs at this time are Dysphagia 2 (minced and moist IDDSI 5)/ thin liquid (IDDSI 0) with assist from nursing staff with ALL PO trials following strict aspiration precautions (sit upright for PO intake and 30 minutes following, small bites and sips, reduced rate of intake, cued liquid wash).    HPI HPI: Alexis [name] is a 72 y.o. female with medical history significant of diabetic type II, depression, Vasculitis, diabetic peripheral neuropathy, gastroparesis, migraine, sleep apnea noncompliant with CPAP, presents from home with altered mental status. Per husband patient's have not been acting herself. She was recently seen at a [facility] med (on Monday) and diagnosed with shingles.      SLP Plan  Continue with current plan of care      Recommendations for follow up therapy are one component of a multi-disciplinary discharge  planning process, led by the attending physician.  Recommendations may be updated based on patient status, additional functional criteria and insurance authorization.    Recommendations  Diet recommendations: Dysphagia 2 (fine chop);Thin liquid Liquids provided via: Straw Medication Administration: Crushed with puree Supervision: Staff to assist with self feeding;Full supervision/cueing for compensatory strategies Compensations: Minimize environmental distractions;Slow rate;Small sips/bites;Follow solids with liquid Postural Changes and/or Swallow Maneuvers: Seated upright 90 degrees;Upright 30-60 min after meal                        Dysphagia, unspecified (R13.10)     Continue with current plan of care    Dione Housekeeper M.S. CF-SLP

## 2022-11-30 NOTE — Evaluation (Signed)
Speech Language Pathology Evaluation Patient Details Name: Alexis [name] MRN: [id number] DOB: 05-28-1951 Today's Date: 11/30/2022 Time: 4098-1191 SLP Time Calculation (min) (ACUTE ONLY): 10 min  Problem List:  Patient Active Problem List   Diagnosis Date Noted   Encephalopathy acute 11/28/2022   Herpes zoster 11/28/2022   Uncontrolled type 2 diabetes mellitus with hyperglycemia, with long-term current use of insulin 11/28/2022   Diarrhea of presumed infectious origin 10/11/2022   ARF (acute renal failure) 08/18/2021   Transaminitis 08/18/2021   Nephrolithiasis 08/18/2021   Benign neoplasm of colon    Gastric polyps    Heme positive stool    Chronic diarrhea    Anticoagulated    Anemia 07/25/2021   CHF exacerbation 07/24/2021   Acute on chronic respiratory failure with hypoxia 07/24/2021   Chronic diastolic CHF (congestive heart failure) 07/24/2021   Hypokalemia 07/24/2021   New onset of congestive heart failure 04/26/2021   Hypotension 12/07/2020   Acute metabolic encephalopathy 12/07/2020   AF (paroxysmal atrial fibrillation)    Acute blood loss anemia    Subcapital fracture of left femur 12/04/2020   Leukocytosis 12/04/2020   Fall at home, initial encounter 12/04/2020   Diabetic peripheral neuropathy 10/20/2018   Cervical myelopathy 05/07/2018   Type 2 diabetes mellitus 02/18/2018   Memory difficulties 09/18/2017   Coronary artery calcification seen on computed tomography 04/09/2017   Word finding difficulty 09/05/2016   Cervicogenic headache 09/05/2016   Palpitation 08/22/2016   Bilateral leg edema 08/22/2016   Obesity (BMI 30-39.9) 05/25/2014   Precordial chest pain 05/25/2014   Migraine without aura, without mention of intractable migraine without mention of status migrainosus 03/18/2013   Chest pain 01/29/2013   Constipation 02/24/2012   DM2 (diabetes mellitus, type 2) 08/24/2010   Hyperlipidemia 08/24/2010   ANXIETY 08/24/2010   Anxiety and depression  08/24/2010   Essential hypertension 08/24/2010   GERD 08/24/2010   IRRITABLE BOWEL SYNDROME 08/24/2010   FATTY LIVER DISEASE 08/24/2010   ARTHRITIS 08/24/2010   Fibromyalgia 08/24/2010   OSA (obstructive sleep apnea) 08/24/2010   ABDOMINAL PAIN-RUQ 08/24/2010   UTERINE CANCER, HX OF 08/24/2010   GASTRITIS, HX OF 08/24/2010   Past Medical History:  Past Medical History:  Diagnosis Date   Anxiety    Arthritis    "knees" (05/25/2014)   Chronic back pain    Depression    Diabetes mellitus type II    Diabetic peripheral neuropathy 10/20/2018   Diverticulitis    Fatty liver    Fibromyalgia    Gastric polyp    hyperplastic   Gastroparesis    "recently dx'd" (05/25/2014)   GERD (gastroesophageal reflux disease)    H/O hiatal hernia    Hx of gastritis    Hyperlipidemia    Hypertension    Irritable bowel syndrome (IBS)    Memory difficulties 09/18/2017   Migraine without aura, without mention of intractable migraine without mention of status migrainosus    "related to allergies; have them in the spring and fall" (05/25/2014)   Obesity    Sleep apnea    "wore mask; took it off in my sleep; quit wearing it" (05/25/2014)   Uterine cancer dx'd 2000   surg only   Vasculitis    "irritates my legs"   Wears glasses    Past Surgical History:  Past Surgical History:  Procedure Laterality Date   ANTERIOR CERVICAL DECOMPRESSION/DISCECTOMY FUSION 4 LEVELS N/A 05/07/2018   Procedure: ANTERIOR CERVICAL DECOMPRESSION FUSION, CERVICAL 4-5,CERVICAL 5-6, CERVICAL 6-7 WITH INSTRUMENTATION AND ALLOGRAFT;  Surgeon: Estill Bamberg, MD;  Location: [facility] OR;  Service: Orthopedics;  Laterality: N/A;   APPENDECTOMY  ~ 1967   BIOPSY  07/27/2021   Procedure: BIOPSY;  Surgeon: Benancio Deeds, MD;  Location: [facility] ENDOSCOPY;  Service: Gastroenterology;;   BREAST CYST EXCISION Right 1990   COLONOSCOPY     COLONOSCOPY WITH PROPOFOL N/A 07/27/2021   Procedure: COLONOSCOPY WITH PROPOFOL;  Surgeon:  Benancio Deeds, MD;  Location: [facility] ENDOSCOPY;  Service: Gastroenterology;  Laterality: N/A;   ESOPHAGOGASTRODUODENOSCOPY (EGD) WITH PROPOFOL N/A 07/27/2021   Procedure: ESOPHAGOGASTRODUODENOSCOPY (EGD) WITH PROPOFOL;  Surgeon: Benancio Deeds, MD;  Location: [facility] ENDOSCOPY;  Service: Gastroenterology;  Laterality: N/A;   HEMOSTASIS CLIP PLACEMENT  07/27/2021   Procedure: HEMOSTASIS CLIP PLACEMENT;  Surgeon: Benancio Deeds, MD;  Location: [facility] ENDOSCOPY;  Service: Gastroenterology;;   LAPAROSCOPIC CHOLECYSTECTOMY  1990   LEFT HEART CATHETERIZATION WITH CORONARY ANGIOGRAM N/A 05/26/2014   Procedure: LEFT HEART CATHETERIZATION WITH CORONARY ANGIOGRAM;  Surgeon: Lennette Bihari, MD;  Location: [facility] CATH LAB;  Service: Cardiovascular;  Laterality: N/A;   POLYPECTOMY  07/27/2021   Procedure: POLYPECTOMY;  Surgeon: Benancio Deeds, MD;  Location: [facility] ENDOSCOPY;  Service: Gastroenterology;;   [name]  07/27/2021   Procedure: [name];  Surgeon: Benancio Deeds, MD;  Location: [facility] ENDOSCOPY;  Service: Gastroenterology;;   SINUS SURGERY WITH INSTATRAK  2000   TOTAL HIP ARTHROPLASTY Left 12/05/2020   Procedure: TOTAL HIP ARTHROPLASTY ANTERIOR APPROACH;  Surgeon: Samson Frederic, MD;  Location: [facility] OR;  Service: Orthopedics;  Laterality: Left;   TUBAL LIGATION  ~ 1982   VAGINAL HYSTERECTOMY  2000   HPI:  Alexis [name] is a 72 y.o. female with medical history significant of diabetic type II, depression, Vasculitis, diabetic peripheral neuropathy, gastroparesis, migraine, sleep apnea noncompliant with CPAP, presents from home with altered mental status. Per husband patient's have not been acting herself. She was recently seen at a [facility] (on Monday) and diagnosed with shingles.   Assessment / Plan / Recommendation Clinical Impression  Patient presents with moderately impaired cogntion at this time. She was oriented to self and place but not time and only very general  situation ("shingles"). Even when answering basic biographical questions, she hesitated. When trying to state the current year, she would think but never gave a response. She had difficulty maintaining attention and would become easily distracted. SLP did not assess any higher level cognitive function as patient is currently not capable of performing. SLP will continue to follow patient for ongoing cognitive assessment and intervention.    SLP Assessment  SLP Recommendation/Assessment: Patient needs continued Speech Lanaguage Pathology Services SLP Visit Diagnosis: Cognitive communication deficit (R41.841)    Recommendations for follow up therapy are one component of a multi-disciplinary discharge planning process, led by the attending physician.  Recommendations may be updated based on patient status, additional functional criteria and insurance authorization.    Follow Up Recommendations  Acute inpatient rehab (3hours/day)    Assistance Recommended at Discharge  Intermittent Supervision/Assistance  Functional Status Assessment Patient has had a recent decline in their functional status and demonstrates the ability to make significant improvements in function in a reasonable and predictable amount of time.  Frequency and Duration min 2x/week  2 weeks      SLP Evaluation Cognition  Overall Cognitive Status: Impaired/Different from baseline Arousal/Alertness: Awake/alert Orientation Level: Oriented to person;Oriented to place;Disoriented to time;Disoriented to situation Day of Week: Incorrect Attention: Focused Focused Attention: Impaired Focused Attention Impairment: Verbal basic;Functional  basic Memory: Impaired Memory Impairment: Decreased recall of new information;Retrieval deficit Awareness: Impaired Awareness Impairment: Intellectual impairment Problem Solving: Impaired Problem Solving Impairment: Verbal basic;Functional basic Safety/Judgment: Impaired       Comprehension   Auditory Comprehension Overall Auditory Comprehension: Impaired Yes/No Questions: Not tested Conversation: Simple Interfering Components: Attention;Processing speed EffectiveTechniques: Extra processing time Visual Recognition/Discrimination Discrimination: Not tested Reading Comprehension Reading Status: Not tested    Expression Expression Primary Mode of Expression: Verbal Verbal Expression Overall Verbal Expression: Appears within functional limits for tasks assessed   Oral / Motor  Oral Motor/Sensory Function Overall Oral Motor/Sensory Function: Within functional limits Motor Speech Overall Motor Speech: Appears within functional limits for tasks assessed            Angela Nevin, MA, CCC-SLP Speech Therapy

## 2022-12-01 DIAGNOSIS — G9340 Encephalopathy, unspecified: Secondary | ICD-10-CM | POA: Diagnosis not present

## 2022-12-01 LAB — BASIC METABOLIC PANEL
Anion gap: 8 (ref 5–15)
BUN: 13 mg/dL (ref 8–23)
CO2: 28 mmol/L (ref 22–32)
Calcium: 8.2 mg/dL — ABNORMAL LOW (ref 8.9–10.3)
Chloride: 97 mmol/L — ABNORMAL LOW (ref 98–111)
Creatinine, Ser: 0.75 mg/dL (ref 0.44–1.00)
GFR, Estimated: >60 mL/min (ref >60)
Glucose, Bld: 191 mg/dL — ABNORMAL HIGH (ref 70–99)
Potassium: 3.1 mmol/L — ABNORMAL LOW (ref 3.5–5.1)
Sodium: 133 mmol/L — ABNORMAL LOW (ref 135–145)

## 2022-12-01 LAB — GLUCOSE, CAPILLARY
Glucose-Capillary: 142 mg/dL — ABNORMAL HIGH (ref 70–99)
Glucose-Capillary: 191 mg/dL — ABNORMAL HIGH (ref 70–99)
Glucose-Capillary: 290 mg/dL — ABNORMAL HIGH (ref 70–99)
Glucose-Capillary: 292 mg/dL — ABNORMAL HIGH (ref 70–99)

## 2022-12-01 LAB — CSF CULTURE W GRAM STAIN

## 2022-12-01 NOTE — Progress Notes (Signed)
Pharmacy Antibiotic Note  Alexis [name] is a 72 y.o. female admitted on 11/28/2022 with  VZV encephalitis .  Pharmacy has been consulted for acyclovir dosing.  Patient presenting with AMS and hyperglycemia. Patient's husband reports she has not taken her insulin since Monday, 4/15. He reports that she was recently diagnosed with shingles to the right eye as well. Based on weight from March 2024 (81.3 kg), the patient's calculated CrCl is ~56 ml/min. Patient's TBW is >140% of her IBW, so will use her AdjBW of 65kg for acyclovir dosing.   Renal function is stable.   Plan: -Continue acyclovir IV /kg IV q8h (using AdjBW of 65kg)  -BMP q72h while on acyclovir  -Continue LR 100 ml/hr while on acyclovir -Monitor renal function  -14 day stop date entered  Height:  (162.6 cm) Weight: 81.3 kg (179 lb 3.7 oz) IBW/kg (Calculated) : 54.7  Temp (24hrs), Avg:98.5 F (36.9 C), Min:98 F (36.7 C), Max:98.8 F (37.1 C)  Recent Labs  Lab 11/28/22 1419 11/28/22 1422 11/29/22 0126 11/29/22 0459 11/29/22 1042 11/30/22 0241 12/01/22 0036  WBC 10.4  --   --   --  12.1*  --   --   CREATININE 1.19*   < > 1.15* 1.13* 0.98 0.88 0.75   < > = values in this interval not displayed.     Estimated Creatinine Clearance: 66.5 mL/min (by C-G formula based on SCr of 0.75 mg/dL).    Allergies  Allergen Reactions   Aspirin Other (See Comments)    Reaction: Vasculitis per MD   Codeine Itching and Rash    Tolerated Norco 12/07/20 with no reports of rash/itching. Patient endorsed allergy >30 yr ago and hasn't had reaction since. Climb the walls per pt   Naproxen Nausea Only    Reaction: Makes stomach upset/irritated.   Sulfonamide Derivatives Itching and Rash    Antimicrobials this admission: 4/18 acyclovir >>  4/18 vancomycin >> 4/19 4/18 ampicillin >> 4/19  4/18 ceftriaxone >> 4/19   Dose adjustments this admission: N/A  Microbiology results: 4/18 CSF cx: unable to obtain 4/19 CSF:  ngtd 4/19 [employer] panel: VZV   Thank you for involving pharmacy in this patient's care.  Loura Back, PharmD, BCPS Clinical Pharmacist Clinical phone for 12/01/2022 is [phone] 12/01/2022 1:29 PM

## 2022-12-01 NOTE — Progress Notes (Signed)
PROGRESS NOTE  Alexis [name]  DOB: Apr 20, 1951  PCP: Fatima Sanger, FNP ZOX:[id number]  DOA: 11/28/2022  LOS: 3 days  Hospital Day: 4  Brief narrative: Alexis [name] is a 72 y.o. female with PMH significant for DM2, HTN, HLD, diabetic neuropathy, gastroparesis, sleep apnea, migraine, anxiety/depression, fibromyalgia, GERD, IBS 4/18, patient was brought to the ED from home with altered mental status and high blood sugar level. She was  recently seen at an urgent care center on 4/15, diagnosed with shingles involving the V1 distribution including the right eye and right side of the face. She progressively became more confused, weak and agitated and hence brought to the ED ultimately.   In the ED, she was hemodynamically stable Labs showed high blood sugar level over 400 and increased anion gap VBG showed pH 7.42, pCO2 40 She was started on insulin drip and IV fluid ED physician attempted LP and it was unsuccessful.   ED physician discussed with Dr. Vanessa Barbara with ophthalmology and recommendation was to treat for presumed zoster with ganciclovir drop if available.   Neurology was consulted and recommendation was for fluoroscopic guided LP with HSV and VZV and start acyclovir. Admitted to [facility] 4/19, IR did LP at L2-L3 level, opening pressure 18 cm H2O.  11 cc of clear colorless CSF was sent.  CSF analysis showed WBC count elevated to 66, total protein elevated to 100. CSF viral panel was positive for VZV  Subjective: Patient was seen and examined this morning.   Sitting up at the edge of the bed.  Mental status much better.  Able to answer orientation questions appropriately.  Husband at bedside and agrees with improvement in cognition.    Assessment and plan: Right V1 distribution shingles  Herpes zoster ophthalmicus VZV meningitis/encephalitis Right V1 distribution shingles was diagnosed on 4/15.  Unclear if patient was started on acyclovir as an outpatient.   Brought in from  home with altered mental status in the setting of right V1 dermatome shingles.   CSF viral panel positive for VZV Currently on IV acyclovir.  Plan for a 14-day course.  Monitor renal function while on acyclovir.  Continue IV fluid. Neurology following Previous hospitalist discussed the case with ophthalmologist Dr Vanessa Barbara. No ganciclovir drop available.  Also on trifluridine antibiotics per formulary.  Vision remains blurry on the right Needs to follow-up with ophthalmology as an outpatient Recent Labs  Lab 11/28/22 1419 11/29/22 1042  WBC 10.4 12.1*    Type 2 diabetes mellitus uncontrolled with hyperglycemia Diabetic neuropathy A1c 9.8 on 11/30/2022 Presented with blood glucose elevated to over 400. PTA on Farxiga 10 mg daily, Trulicity 1.5 mg every Sunday, Tresiba 18 units nightly, Humalog 8 units 3 times daily. Unclear compliance. Blood sugar level is currently controlled with 75 units daily and sliding scale insulin. Was on Lyrica 50 mg twice daily. Resume the same.  Recent Labs  Lab 11/30/22 0835 11/30/22 1238 11/30/22 1616 11/30/22 2054 12/01/22 0835  GLUCAP 275* 228* 134* 166* 142*    AKI BUN/creatinine improving with hydration.  Continue to maintain hydration while on acyclovir. Recent Labs    06/04/22 1622 11/13/22 0946 11/28/22 1419 11/28/22 1422 11/28/22 2035 11/29/22 0126 11/29/22 0459 11/29/22 1042 11/30/22 0241 12/01/22 0036  BUN 16 16 34* 40* 40* 41* 38* 35* 22 13  CREATININE 0.88 0.71 1.19* 0.90 1.37* 1.15* 1.13* 0.98 0.88 0.75    Chronic diastolic CHF Essential hypertension  PTA on Toprol 100 mg twice daily, valsartan 160 mg daily, Lasix  40 mg twice daily, Aldactone 12.5 mg daily, Norvasc 5 mg daily Most recent echo from 2022 with EF 65 to 70%, grade 2 diastolic dysfunction Heart rate and blood pressure remains controlled with metoprolol 25 mg twice daily and Norvasc 5 mg daily.  Lasix and Aldactone remain on hold.  Patient is rather receiving IV  fluid to prevent renal injury while not significant.  History of A-fib Per report, amiodarone was stopped by cardiology on 08/2022 due to transaminases.  PTA on Toprol 100 mg twice daily Currently on metoprolol 25 mg daily. Eliquis resumed 4/20  Elevated LFTs and ammonia History of fatty liver Mild elevation noted Liver ultrasound showed fatty liver Recent Labs  Lab 11/28/22 1419 11/28/22 2035 11/29/22 1042  AST 56*  --  47*  ALT 33  --  29  ALKPHOS 140*  --  115  BILITOT 1.0  --  0.7  PROT 7.1  --  6.3*  ALBUMIN 2.7*  --  2.4*  AMMONIA  --  43*  --   PLT 270  --  184    HLD Lipitor daily  Anxiety/depression/fibromyalgia Insomnia PTA on Effexor 150 mg twice daily, Xanax 0.5 mg nightly as needed, On zaleplon nightly for insomnia Currently all mood altering medications on hold.   Mobility: Encourage ambulation  Goals of care   Code Status: Full Code     DVT prophylaxis:  SCDs Start: 11/28/22 2017 apixaban (ELIQUIS) tablet 5 mg   Antimicrobials: IV acyclovir Fluid: LR at 100 mill per hour to continue Consultants: Neurology Family Communication: Husband at bedside  Status: Inpatient Level of care:  Progressive   Needs to continue in-hospital care:  Mental status improving currently on IV acyclovir.  Patient from: Home Anticipated d/c to: Pending clinical course    Diet:  Diet Order             DIET DYS 2 Room service appropriate? Yes; Fluid consistency: Thin  Diet effective now                   Scheduled Meds:  amLODipine  5 mg Oral Daily   apixaban  5 mg Oral BID   Chlorhexidine Gluconate Cloth  6 each Topical Daily   erythromycin   Right Eye Q6H   insulin aspart  0-15 Units Subcutaneous TID WC   insulin glargine-yfgn  5 Units Subcutaneous Daily   metoprolol tartrate  25 mg Oral BID   ofloxacin  1 drop Right Eye Q4H   pregabalin  50 mg Oral BID   sodium chloride flush  3 mL Intravenous Q12H   trifluridine  1 drop Right Eye Q2H while  awake    PRN meds: sodium chloride, acetaminophen **OR** acetaminophen, dextrose, hydrALAZINE, ondansetron **OR** ondansetron (ZOFRAN) IV, senna-docusate, sodium chloride flush   Infusions:   sodium chloride Stopped (11/29/22 1731)   acyclovir 655 mg (12/01/22 0532)   lactated ringers 100 mL/hr at 12/01/22 0334    Antimicrobials: Anti-infectives (From admission, onward)    Start     Dose/Rate Route Frequency Ordered Stop   11/29/22 0800  vancomycin (VANCOREADY) IVPB 750 mg/150 mL  Status:  Discontinued       See Hyperspace for full Linked Orders Report.   750 mg 150 mL/hr over 60 Minutes Intravenous Every 12 hours 11/28/22 1914 11/29/22 1332   11/28/22 2000  ampicillin (OMNIPEN) 2 g in sodium chloride 0.9 % 100 mL IVPB  Status:  Discontinued        2 g 300  mL/hr over 20 Minutes Intravenous Every 4 hours 11/28/22 1859 11/29/22 1332   11/28/22 2000  vancomycin (VANCOREADY) IVPB 1750 mg/350 mL       See Hyperspace for full Linked Orders Report.   1,750 mg 175 mL/hr over 120 Minutes Intravenous  Once 11/28/22 1914 11/29/22 0109   11/28/22 1915  cefTRIAXone (ROCEPHIN) 2 g in sodium chloride 0.9 % 100 mL IVPB  Status:  Discontinued        2 g 200 mL/hr over 30 Minutes Intravenous Every 12 hours 11/28/22 1858 11/29/22 1332   11/28/22 1730  acyclovir (ZOVIRAX) 655 mg in dextrose 5 % 100 mL IVPB        10 mg/kg  65.3 kg (Adjusted) 113.1 mL/hr over 60 Minutes Intravenous Every 8 hours 11/28/22 1655         Nutritional status:  Body mass index is 30.77 kg/m.          Objective: Vitals:   12/01/22 0900 12/01/22 1000  BP: 119/60 121/83  Pulse: 85 77  Resp: 20 20  Temp:    SpO2: 98% 92%    Intake/Output Summary (Last 24 hours) at 12/01/2022 1109 Last data filed at 12/01/2022 0800 Gross per 24 hour  Intake 1395 ml  Output 550 ml  Net 845 ml    Filed Weights   11/28/22 1600  Weight: 81.3 kg   Weight change:  Body mass index is 30.77 kg/m.   Physical  Exam: General exam: Pleasant, elderly Caucasian female.  Sitting up at the edge of the bed. Skin: No rashes, lesions or ulcers. HEENT: Atraumatic, normocephalic, no obvious bleeding.  Shingles in the V1 dermatome involving the right eye.  Vision blurry on the right eye Lungs: Clear to auscultation bilaterally CVS: Tachycardic, regular rhythm, no murmur GI/Abd soft, nontender, nondistended, bowel sound present CNS: Alert, awake, oriented x 3.  Not restless or agitated Psychiatry: Mood appropriate Extremities: No pedal edema, no calf tenderness  Data Review: I have personally reviewed the laboratory data and studies available.  F/u labs ordered Unresulted Labs (From admission, onward)     Start     Ordered   11/29/22 0913  Pathologist smear review  Once,   R        11/29/22 0913   11/28/22 1415  Urinalysis, w/ Reflex to Culture (Infection Suspected) -Urine, Clean Catch  Once,   URGENT       Question:  Specimen Source  Answer:  Urine, Clean Catch   11/28/22 1414            Total time spent in review of labs and imaging, patient evaluation, formulation of plan, documentation and communication with family: 45 minutes  Signed, Lorin Glass, MD [facility] 12/01/2022

## 2022-12-02 ENCOUNTER — Other Ambulatory Visit: Payer: Self-pay

## 2022-12-02 DIAGNOSIS — G9340 Encephalopathy, unspecified: Secondary | ICD-10-CM | POA: Diagnosis not present

## 2022-12-02 LAB — GLUCOSE, CAPILLARY
Glucose-Capillary: 185 mg/dL — ABNORMAL HIGH (ref 70–99)
Glucose-Capillary: 191 mg/dL — ABNORMAL HIGH (ref 70–99)
Glucose-Capillary: 221 mg/dL — ABNORMAL HIGH (ref 70–99)
Glucose-Capillary: 255 mg/dL — ABNORMAL HIGH (ref 70–99)
Glucose-Capillary: 275 mg/dL — ABNORMAL HIGH (ref 70–99)

## 2022-12-02 MED ORDER — DAPAGLIFLOZIN PROPANEDIOL 10 MG PO TABS
10.0000 mg | ORAL_TABLET | Freq: Every morning | ORAL | Status: DC
  Administered 2022-12-02 – 2022-12-04 (×3): 10 mg via ORAL
  Filled 2022-12-02 (×3): qty 1

## 2022-12-02 MED ORDER — POTASSIUM CHLORIDE CRYS ER 20 MEQ PO TBCR
40.0000 meq | EXTENDED_RELEASE_TABLET | ORAL | Status: AC
  Administered 2022-12-02 (×2): 40 meq via ORAL
  Filled 2022-12-02 (×2): qty 2

## 2022-12-02 MED ORDER — PREGABALIN 75 MG PO CAPS
75.0000 mg | ORAL_CAPSULE | Freq: Two times a day (BID) | ORAL | Status: DC
  Administered 2022-12-02 – 2022-12-04 (×4): 75 mg via ORAL
  Filled 2022-12-02 (×4): qty 1

## 2022-12-02 MED ORDER — VENLAFAXINE HCL ER 75 MG PO CP24
150.0000 mg | ORAL_CAPSULE | Freq: Every day | ORAL | Status: DC
  Administered 2022-12-03 – 2022-12-04 (×2): 150 mg via ORAL
  Filled 2022-12-02 (×2): qty 2

## 2022-12-02 MED ORDER — INSULIN GLARGINE-YFGN 100 UNIT/ML ~~LOC~~ SOLN
10.0000 [IU] | Freq: Every day | SUBCUTANEOUS | Status: DC
  Administered 2022-12-02 – 2022-12-04 (×3): 10 [IU] via SUBCUTANEOUS
  Filled 2022-12-02 (×3): qty 0.1

## 2022-12-02 NOTE — Care Management Important Message (Signed)
Important Message  Patient Details  Name: ROOSEVELT BISHER MRN: [id number] Date of Birth: 11-01-1950   Medicare Important Message Given:  Yes     Sherilyn Banker 12/02/2022, 1:01 PM

## 2022-12-02 NOTE — Progress Notes (Signed)
Brief Neuro Update:  MRI Brain non revealing. Continue IV Acyclovir x 14 days for VZV encephalitis.  Neurology will signoff. She will need to follow up with neurology outpatient.  Please feel free to [place] with any questions or concerns. Plan discussed with Dr. Pola Corn with Hospitalist team over secure chat.  Erick Blinks [facility] Pager Number [phone]

## 2022-12-02 NOTE — Progress Notes (Signed)
PROGRESS NOTE  Alexis [name]  DOB: 04-16-1951  PCP: Fatima Sanger, FNP ZOX:[id number]  DOA: 11/28/2022  LOS: 4 days  Hospital Day: 5  Brief narrative: Alexis [name] is a 72 y.o. female with PMH significant for DM2, HTN, HLD, diabetic neuropathy, gastroparesis, sleep apnea, migraine, anxiety/depression, fibromyalgia, GERD, IBS 4/18, patient was brought to the ED from home with altered mental status and high blood sugar level. She was  recently seen at an urgent care center on 4/15, diagnosed with shingles involving the V1 distribution including the right eye and right side of the face. She progressively became more confused, weak and agitated and hence brought to the ED ultimately.   In the ED, she was hemodynamically stable Labs showed high blood sugar level over 400 and increased anion gap VBG showed pH 7.42, pCO2 40 She was started on insulin drip and IV fluid ED physician attempted LP and it was unsuccessful.   ED physician discussed with Dr. Vanessa Barbara with ophthalmology and recommendation was to treat for presumed zoster with ganciclovir drop if available.   Neurology was consulted and recommendation was for fluoroscopic guided LP with HSV and VZV and start acyclovir. Admitted to [facility] 4/19, IR did LP at L2-L3 level, opening pressure 18 cm H2O.  11 cc of clear colorless CSF was sent.  CSF analysis showed WBC count elevated to 66, total protein elevated to 100. CSF viral panel was positive for VZV  Subjective: Patient was seen and examined this morning.  Sitting up at the edge of the bed.  Mental status much better and back to normal. Complains of burning pain at the site of shingles.    Assessment and plan: Right V1 distribution shingles  Herpes zoster ophthalmicus VZV meningitis/encephalitis Right V1 distribution shingles was diagnosed on 4/15.  Unclear if patient was started on acyclovir as an outpatient.   Brought in from home with altered mental status in the  setting of right V1 dermatome shingles.   CSF viral panel positive for VZV Currently on IV acyclovir.  Plan for a 14-day course.  Monitor renal function while on acyclovir.  Continue IV fluid. Neurology signed off.  ID consulted. Previous hospitalist discussed the case with ophthalmologist Dr Vanessa Barbara. No ganciclovir drop available.  Also on trifluridine antibiotics per formulary.  Vision remains blurry on the right Needs to follow-up with ophthalmology as an outpatient She is on pregabalin 50 mg twice daily for long-term for peripheral neuropathy.  Complaining of burning pain at the site of shingles.  Increase pregabalin to 75 mg twice daily. Recent Labs  Lab 11/28/22 1419 11/29/22 1042  WBC 10.4 12.1*   Type 2 diabetes mellitus uncontrolled with hyperglycemia Diabetic neuropathy A1c 9.8 on 11/30/2022 Presented with blood glucose elevated to over 400. PTA on Farxiga 10 mg daily, Trulicity 1.5 mg every Sunday, Tresiba 18 units nightly, Humalog 8 units 3 times daily. Unclear compliance. Currently on semglee 5 units daily and sliding scale insulin.  Blood sugar level running elevated over 200 consistently.  Increased to 10 units today.  Also continue [place]. Recent Labs  Lab 12/01/22 1314 12/01/22 1658 12/01/22 2108 12/02/22 0032 12/02/22 0819  GLUCAP 191* 290* 292* 275* 191*   AKI BUN/creatinine improving with hydration.  Continue to maintain hydration while on acyclovir. Recent Labs    06/04/22 1622 11/13/22 0946 11/28/22 1419 11/28/22 1422 11/28/22 2035 11/29/22 0126 11/29/22 0459 11/29/22 1042 11/30/22 0241 12/01/22 0036  BUN 16 16 34* 40* 40* 41* 38* 35* 22 13  CREATININE 0.88 0.71 1.19* 0.90 1.37* 1.15* 1.13* 0.98 0.88 0.75   Hypokalemia Potassium level low at 3.1 on 4/21.  Replacement ordered.  Recheck tomorrow Recent Labs  Lab 11/29/22 0126 11/29/22 0459 11/29/22 1042 11/30/22 0241 12/01/22 0036  K 4.1 3.9 3.9 3.6 3.1*   Chronic diastolic CHF Essential  hypertension  PTA on Toprol 100 mg twice daily, valsartan 160 mg daily, Lasix 40 mg twice daily, Aldactone 12.5 mg daily, Norvasc 5 mg daily Most recent echo from 2022 with EF 65 to 70%, grade 2 diastolic dysfunction Heart rate and blood pressure remains controlled with metoprolol 25 mg twice daily and Norvasc 5 mg daily.  Lasix and Aldactone remain on hold.  Patient is rather receiving IV fluid to prevent renal injury while not significant.  History of A-fib Per report, amiodarone was stopped by cardiology on 08/2022 due to transaminases.  PTA on Toprol 100 mg twice daily Currently on metoprolol 25 mg daily. Eliquis resumed 4/20  Elevated LFTs and ammonia History of fatty liver Mild elevation noted Liver ultrasound showed fatty liver Recent Labs  Lab 11/28/22 1419 11/28/22 2035 11/29/22 1042  AST 56*  --  47*  ALT 33  --  29  ALKPHOS 140*  --  115  BILITOT 1.0  --  0.7  PROT 7.1  --  6.3*  ALBUMIN 2.7*  --  2.4*  AMMONIA  --  43*  --   PLT 270  --  184   HLD Lipitor daily  Anxiety/depression/fibromyalgia Insomnia PTA on Effexor 150 mg twice daily, Xanax 0.5 mg nightly as needed, On zaleplon nightly for insomnia Currently all mood altering medications on hold. Mental status improving.  Okay to resume Effexor   Mobility: Encourage ambulation  Goals of care   Code Status: Full Code     DVT prophylaxis:  SCDs Start: 11/28/22 2017 apixaban (ELIQUIS) tablet 5 mg   Antimicrobials: IV acyclovir Fluid: LR at 100 mill per hour to continue Consultants: Neurology Family Communication: Husband at bedside  Status: Inpatient Level of care:  Progressive   Needs to continue in-hospital care:  Mental status improving currently on IV acyclovir.  Patient from: Home Anticipated d/c to: Pending clinical course    Diet:  Diet Order             DIET DYS 2 Room service appropriate? Yes; Fluid consistency: Thin  Diet effective now                   Scheduled  Meds:  amLODipine  5 mg Oral Daily   apixaban  5 mg Oral BID   Chlorhexidine Gluconate Cloth  6 each Topical Daily   dapagliflozin propanediol  10 mg Oral q AM   erythromycin   Right Eye Q6H   insulin aspart  0-15 Units Subcutaneous TID WC   insulin glargine-yfgn  10 Units Subcutaneous Daily   metoprolol tartrate  25 mg Oral BID   ofloxacin  1 drop Right Eye Q4H   pregabalin  75 mg Oral BID   sodium chloride flush  3 mL Intravenous Q12H   trifluridine  1 drop Right Eye Q2H while awake   [START ON 12/03/2022] venlafaxine XR  150 mg Oral Q breakfast    PRN meds: sodium chloride, acetaminophen **OR** acetaminophen, dextrose, hydrALAZINE, ondansetron **OR** ondansetron (ZOFRAN) IV, senna-docusate, sodium chloride flush   Infusions:   sodium chloride Stopped (11/29/22 1731)   acyclovir 655 mg (12/02/22 1610)   lactated ringers 100 mL/hr at 12/02/22  0100    Antimicrobials: Anti-infectives (From admission, onward)    Start     Dose/Rate Route Frequency Ordered Stop   11/29/22 0800  vancomycin (VANCOREADY) IVPB 750 mg/150 mL  Status:  Discontinued       See Hyperspace for full Linked Orders Report.   750 mg 150 mL/hr over 60 Minutes Intravenous Every 12 hours 11/28/22 1914 11/29/22 1332   11/28/22 2000  ampicillin (OMNIPEN) 2 g in sodium chloride 0.9 % 100 mL IVPB  Status:  Discontinued        2 g 300 mL/hr over 20 Minutes Intravenous Every 4 hours 11/28/22 1859 11/29/22 1332   11/28/22 2000  vancomycin (VANCOREADY) IVPB 1750 mg/350 mL       See Hyperspace for full Linked Orders Report.   1,750 mg 175 mL/hr over 120 Minutes Intravenous  Once 11/28/22 1914 11/29/22 0109   11/28/22 1915  cefTRIAXone (ROCEPHIN) 2 g in sodium chloride 0.9 % 100 mL IVPB  Status:  Discontinued        2 g 200 mL/hr over 30 Minutes Intravenous Every 12 hours 11/28/22 1858 11/29/22 1332   11/28/22 1730  acyclovir (ZOVIRAX) 655 mg in dextrose 5 % 100 mL IVPB        10 mg/kg  65.3 kg (Adjusted) 113.1 mL/hr  over 60 Minutes Intravenous Every 8 hours 11/28/22 1655 12/12/22 1359       Nutritional status:  Body mass index is 30.77 kg/m.          Objective: Vitals:   12/02/22 0023 12/02/22 0636  BP: (!) 128/55 99/65  Pulse: 79 (!) 129  Resp:    Temp: 98.5 F (36.9 C) 98.6 F (37 C)  SpO2: (!) 83% (!) 77%    Intake/Output Summary (Last 24 hours) at 12/02/2022 1322 Last data filed at 12/02/2022 0900 Gross per 24 hour  Intake 2204.86 ml  Output --  Net 2204.86 ml   Filed Weights   11/28/22 1600  Weight: 81.3 kg   Weight change:  Body mass index is 30.77 kg/m.   Physical Exam: General exam: Pleasant, elderly Caucasian female.  Not in distress. Skin: No rashes, lesions or ulcers. HEENT: Atraumatic, normocephalic, no obvious bleeding.  Improving redness from shingles in the V1 dermatome involving the right eye.  Vision blurry on the right eye Lungs: Clear to auscultation bilaterally CVS: Tachycardic, regular rhythm, no murmur GI/Abd soft, nontender, nondistended, bowel sound present CNS: Alert, awake, oriented x 3.  Not restless or agitated Psychiatry: Mood appropriate Extremities: No pedal edema, no calf tenderness  Data Review: I have personally reviewed the laboratory data and studies available.  F/u labs ordered Unresulted Labs (From admission, onward)     Start     Ordered   12/03/22 0500  Basic metabolic panel  Every third day,   R     Question:  Specimen collection method  Answer:  Lab=Lab collect   12/01/22 1338   12/03/22 0500  Magnesium  Tomorrow morning,   R       Question:  Specimen collection method  Answer:  Lab=Lab collect   12/02/22 1031   11/29/22 0913  Pathologist smear review  Once,   R        11/29/22 0913   11/28/22 1415  Urinalysis, w/ Reflex to Culture (Infection Suspected) -Urine, Clean Catch  Once,   URGENT       Question:  Specimen Source  Answer:  Urine, Clean Catch   11/28/22 1414  Total time spent in review of labs and  imaging, patient evaluation, formulation of plan, documentation and communication with family: 45 minutes  Signed, Lorin Glass, MD [facility] 12/02/2022

## 2022-12-02 NOTE — Plan of Care (Signed)

## 2022-12-02 NOTE — Consult Note (Signed)
[facility]       Reason for Consult:VZV encephalitis    Referring Physician: Dr. Pola Corn  Principal Problem:   Encephalopathy acute Active Problems:   DM2 (diabetes mellitus, type 2)   Essential hypertension   Diabetic peripheral neuropathy   AF (paroxysmal atrial fibrillation)   Chronic diastolic CHF (congestive heart failure)   Herpes zoster   Uncontrolled type 2 diabetes mellitus with hyperglycemia, with long-term current use of insulin    amLODipine  5 mg Oral Daily   apixaban  5 mg Oral BID   Chlorhexidine Gluconate Cloth  6 each Topical Daily   dapagliflozin propanediol  10 mg Oral q AM   erythromycin   Right Eye Q6H   insulin aspart  0-15 Units Subcutaneous TID WC   insulin glargine-yfgn  10 Units Subcutaneous Daily   metoprolol tartrate  25 mg Oral BID   ofloxacin  1 drop Right Eye Q4H   pregabalin  75 mg Oral BID   sodium chloride flush  3 mL Intravenous Q12H   trifluridine  1 drop Right Eye Q2H while awake   [START ON 12/03/2022] venlafaxine XR  150 mg Oral Q breakfast    Recommendations: IV acyclovir 7 days at discharge Picc line  Assessment: She has a zoster rash around her eye and VZV positive PCR in her CSF with confusion most consistent with VZV encephalitis.  Will need 10-14 days IV acyclovir.   Day 5 acyclovir  Diagnosis: VZV encephalitis  Culture Result: VZV PCR  Allergies  Allergen Reactions   Aspirin Other (See Comments)    Reaction: Vasculitis per MD   Codeine Itching and Rash    Tolerated Norco 12/07/20 with no reports of rash/itching. Patient endorsed allergy >30 yr ago and hasn't had reaction since. Climb the walls per pt   Naproxen Nausea Only    Reaction: Makes stomach upset/irritated.   Sulfonamide Derivatives Itching and Rash    OPAT Orders Discharge antibiotics to be given via PICC line Discharge antibiotics: acyclovir IV 655 mg every 8 hours Per pharmacy protocol yes Duration: 7 days after  discharge  [facility] Care Per Protocol: yes  Home health RN for IV administration and teaching; PICC line care and labs.    Labs weekly while on IV antibiotics: _x_ CBC with differential _x_ BMP - twice weekly  _x_ Please pull PIC at completion of IV antibiotics __ Please leave PIC in place until doctor has seen patient or been notified  Fax weekly labs to [phone]  Clinic Follow Up Appt: 4/29 with [name] at 9am   HPI: Alexis [name] is a 72 y.o. female with a history of multiple medical issues and came in with confusion.  She was seen in urgent care and diagnosed with zoster-like rash over her right eye and then came to the ED with altered mental status.  Work up including LP c/w VZV on CSF panel.  Also with 66 WBCs.  No fever.  Back to her baseline mentation now.  Husband at bedside.     Review of Systems:  Constitutional: negative for fevers and chills All other systems reviewed and are negative    Past Medical History:  Diagnosis Date   Anxiety    Arthritis    "knees" (05/25/2014)   Chronic back pain    Depression    Diabetes mellitus type II    Diabetic peripheral neuropathy 10/20/2018   Diverticulitis    Fatty liver    Fibromyalgia  Gastric polyp    hyperplastic   Gastroparesis    "recently dx'd" (05/25/2014)   GERD (gastroesophageal reflux disease)    H/O hiatal hernia    Hx of gastritis    Hyperlipidemia    Hypertension    Irritable bowel syndrome (IBS)    Memory difficulties 09/18/2017   Migraine without aura, without mention of intractable migraine without mention of status migrainosus    "related to allergies; have them in the spring and fall" (05/25/2014)   Obesity    Sleep apnea    "wore mask; took it off in my sleep; quit wearing it" (05/25/2014)   Uterine cancer dx'd 2000   surg only   Vasculitis    "irritates my legs"   Wears glasses     Social History   Tobacco Use   Smoking status: Never   Smokeless tobacco: Never   Vaping Use   Vaping Use: Never used  Substance Use Topics   Alcohol use: No   Drug use: No    Family History  Problem Relation Age of Onset   Colon polyps Father    Heart disease Father    Breast cancer Sister    COPD Brother    Irritable bowel syndrome Daughter    Ovarian cancer Maternal Aunt    Stomach cancer Maternal Aunt    Diabetes Maternal Aunt    Colon cancer Maternal Uncle    Heart disease Maternal Uncle    Heart disease Other        Grandparents   Esophageal cancer Neg Hx    Pancreatic cancer Neg Hx     Allergies  Allergen Reactions   Aspirin Other (See Comments)    Reaction: Vasculitis per MD   Codeine Itching and Rash    Tolerated Norco 12/07/20 with no reports of rash/itching. Patient endorsed allergy >30 yr ago and hasn't had reaction since. Climb the walls per pt   Naproxen Nausea Only    Reaction: Makes stomach upset/irritated.   Sulfonamide Derivatives Itching and Rash    Physical Exam: Constitutional: in no apparent distress  Vitals:   12/02/22 0023 12/02/22 0636  BP: (!) 128/55 99/65  Pulse: 79 (!) 129  Resp:    Temp: 98.5 F (36.9 C) 98.6 F (37 C)  SpO2: (!) 83% (!) 77%   EYES: anicteric ENMT: right eye with erythema in V1 distribution, eye with conjunctivitis.   Respiratory: normal respiratory effort   Lab Results  Component Value Date   WBC 12.1 (H) 11/29/2022   HGB 15.2 (H) 11/29/2022   HCT 46.2 (H) 11/29/2022   MCV 86.0 11/29/2022   PLT 184 11/29/2022    Lab Results  Component Value Date   CREATININE 0.75 12/01/2022   BUN 13 12/01/2022   NA 133 (L) 12/01/2022   K 3.1 (L) 12/01/2022   CL 97 (L) 12/01/2022   CO2 28 12/01/2022    Lab Results  Component Value Date   ALT 29 11/29/2022   AST 47 (H) 11/29/2022   ALKPHOS 115 11/29/2022     Microbiology: Recent Results (from the past 240 hour(s))  VZV PCR, CSF     Status: None   Collection Time: 11/28/22  3:48 PM   Specimen: Cerebrospinal Fluid  Result Value Ref Range  Status   VZV PCR, CSF Positive Negative Final    Comment: CRITICAL RESULT CALLED TO, READ BACK BY AND VERIFIED WITH: T,KRAMER RN  11/30/22 E,BENTON (NOTE) Varicella Zoster Virus DNA detected. Called/faxed to [facility], LAB on 11/30/2022  at 14:06 ET for test(s) VZV PCR, CSF Performed At: [facility] [address] [place], [place] [id number] Jolene Schimke MD [id number] Performed at [facility], [address] [place], [place] [address]   CSF culture w Gram Stain     Status: None   Collection Time: 11/29/22  9:13 AM   Specimen: PATH Cytology CSF; Cerebrospinal Fluid  Result Value Ref Range Status   Specimen Description CSF  Final   Special Requests NONE  Final   Gram Stain   Final    WBC PRESENT, PREDOMINANTLY MONONUCLEAR NO ORGANISMS SEEN CYTOSPIN SMEAR    Culture   Final    NO GROWTH 3 DAYS Performed at [facility], [address], [place], [place] [address]    Report Status 12/02/2022 FINAL  Final    Gardiner Barefoot, MD [facility] [email or web address] 12/02/2022, 2:50 PM

## 2022-12-02 NOTE — Progress Notes (Signed)
Speech Language Pathology Treatment: Dysphagia  Patient Details Name: Alexis [name] MRN: [id number] DOB: 1951-07-31 Today's Date: 12/02/2022 Time: 7829-5621 SLP Time Calculation (min) (ACUTE ONLY): 10 min  Assessment / Plan / Recommendation Clinical Impression  Pt back to baseline. Able to self feed, no difficulty masticating. Able to resume regular diet and thin liquids. Will sign off for swallowing and cognition.   HPI HPI: Alexis [name] is a 72 y.o. female with medical history significant of diabetic type II, depression, Vasculitis, diabetic peripheral neuropathy, gastroparesis, migraine, sleep apnea noncompliant with CPAP, presents from home with altered mental status. Per husband patient's have not been acting herself. She was recently seen at a [facility] (on Monday) and diagnosed with shingles.      SLP Plan  All goals met      Recommendations for follow up therapy are one component of a multi-disciplinary discharge planning process, led by the attending physician.  Recommendations may be updated based on patient status, additional functional criteria and insurance authorization.    Recommendations  Diet recommendations: Regular;Thin liquid Liquids provided via: Straw Medication Administration: Whole meds with liquid Supervision: Patient able to self feed                      Intermittent Supervision/Assistance Cognitive communication deficit (H08.657)     All goals met     Mikolaj Woolstenhulme, Riley Nearing  12/02/2022, 1:57 PM

## 2022-12-03 DIAGNOSIS — G9340 Encephalopathy, unspecified: Secondary | ICD-10-CM | POA: Diagnosis not present

## 2022-12-03 LAB — GLUCOSE, CAPILLARY
Glucose-Capillary: 164 mg/dL — ABNORMAL HIGH (ref 70–99)
Glucose-Capillary: 273 mg/dL — ABNORMAL HIGH (ref 70–99)
Glucose-Capillary: 287 mg/dL — ABNORMAL HIGH (ref 70–99)
Glucose-Capillary: 219 mg/dL — ABNORMAL HIGH (ref 70–99)

## 2022-12-03 LAB — MAGNESIUM: Magnesium: 1.7 mg/dL (ref 1.7–2.4)

## 2022-12-03 LAB — BASIC METABOLIC PANEL
Anion gap: 9 (ref 5–15)
BUN: 16 mg/dL (ref 8–23)
CO2: 29 mmol/L (ref 22–32)
Calcium: 8.7 mg/dL — ABNORMAL LOW (ref 8.9–10.3)
Chloride: 98 mmol/L (ref 98–111)
Creatinine, Ser: 0.77 mg/dL (ref 0.44–1.00)
GFR, Estimated: >60 mL/min (ref >60)
Glucose, Bld: 167 mg/dL — ABNORMAL HIGH (ref 70–99)
Potassium: 3.3 mmol/L — ABNORMAL LOW (ref 3.5–5.1)
Sodium: 136 mmol/L (ref 135–145)

## 2022-12-03 LAB — PATHOLOGIST SMEAR REVIEW

## 2022-12-03 MED ORDER — SODIUM CHLORIDE 0.9 % IV SOLN: 500.0000 mL | Freq: Two times a day (BID) | INTRAVENOUS | 0 refills | Status: AC

## 2022-12-03 MED ORDER — SODIUM CHLORIDE 0.9% FLUSH
10.0000 mL | Freq: Two times a day (BID) | INTRAVENOUS | Status: DC
  Administered 2022-12-04: 10 mL
  Administered 2022-12-04: 20 mL

## 2022-12-03 MED ORDER — POTASSIUM CHLORIDE CRYS ER 20 MEQ PO TBCR
40.0000 meq | EXTENDED_RELEASE_TABLET | Freq: Once | ORAL | Status: AC
  Administered 2022-12-03: 40 meq via ORAL
  Filled 2022-12-03: qty 2

## 2022-12-03 MED ORDER — SODIUM CHLORIDE 0.9% FLUSH: 10.0000 mL | INTRAVENOUS | Status: DC | PRN

## 2022-12-03 MED ORDER — MAGNESIUM SULFATE 2 GM/50ML IV SOLN
2.0000 g | Freq: Once | INTRAVENOUS | Status: AC
  Administered 2022-12-03: 2 g via INTRAVENOUS
  Filled 2022-12-03: qty 50

## 2022-12-03 MED ORDER — METOPROLOL TARTRATE 50 MG PO TABS
50.0000 mg | ORAL_TABLET | Freq: Two times a day (BID) | ORAL | Status: DC
  Administered 2022-12-03 – 2022-12-04 (×2): 50 mg via ORAL
  Filled 2022-12-03 (×2): qty 1

## 2022-12-03 MED ORDER — IRBESARTAN 150 MG PO TABS
150.0000 mg | ORAL_TABLET | Freq: Every day | ORAL | Status: DC
  Administered 2022-12-03 – 2022-12-04 (×2): 150 mg via ORAL
  Filled 2022-12-03 (×2): qty 1

## 2022-12-03 MED ORDER — ACYCLOVIR SODIUM 50 MG/ML IV SOLN: 650.0000 mg | Freq: Three times a day (TID) | INTRAVENOUS | 0 refills | Status: AC

## 2022-12-03 MED ORDER — ALPRAZOLAM 0.5 MG PO TABS
0.5000 mg | ORAL_TABLET | Freq: Every day | ORAL | Status: DC
  Administered 2022-12-03: 0.5 mg via ORAL
  Filled 2022-12-03: qty 1

## 2022-12-03 NOTE — Progress Notes (Signed)
Peripherally Inserted Central Catheter Placement  The IV Nurse has discussed with the patient and/or persons authorized to consent for the patient, the purpose of this procedure and the potential benefits and risks involved with this procedure.  The benefits include less needle sticks, lab draws from the catheter, and the patient may be discharged home with the catheter. Risks include, but not limited to, infection, bleeding, blood clot (thrombus formation), and puncture of an artery; nerve damage and irregular heartbeat and possibility to perform a PICC exchange if needed/ordered by physician.  Alternatives to this procedure were also discussed.  Bard Power PICC patient education guide, fact sheet on infection prevention and patient information card has been provided to patient /or left at bedside.    PICC Placement Documentation  PICC Double Lumen 12/03/22 Right Basilic 36 cm 1 cm (Active)  Indication for Insertion or Continuance of Line Home intravenous therapies (PICC only) 12/03/22 2300  Exposed Catheter (cm) 1 cm 12/03/22 2300  Site Assessment Clean, Dry, Intact 12/03/22 2300  Lumen #1 Status Saline locked;Flushed;Blood return noted 12/03/22 2300  Lumen #2 Status Saline locked;Flushed;Blood return noted 12/03/22 2300  Dressing Type Transparent;Securing device 12/03/22 2300  Dressing Status Antimicrobial disc in place;Clean, Dry, Intact 12/03/22 2300  Safety Lock Not Applicable 12/03/22 2300  Line Care Connections checked and tightened 12/03/22 2300  Line Adjustment (NICU/IV Team Only) No 12/03/22 2300  Dressing Intervention New dressing 12/03/22 2300       [name] 12/03/2022, 11:01 PM

## 2022-12-03 NOTE — Progress Notes (Signed)
Pharmacy Antibiotic Note  Alexis [name] is a 72 y.o. female admitted on 11/28/2022 with  VZV encephalitis .  Pharmacy has been consulted for acyclovir dosing.  Per husband, patient was recently diagnosed with shingles to the right eye  Renal function is stable, afebrile, WBC WNL.  Plan: Continue acyclovir /kg IV Q8H (using AdjBW of 65kg)  Continue LR 50 ml/hr while on acyclovir OPAT completed, so will sign off and follow peripherally.  Thank you for the consult!  Height:  (162.6 cm) Weight: 81.3 kg (179 lb 3.7 oz) IBW/kg (Calculated) : 54.7  Temp (24hrs), Avg:98.6 F (37 C), Min:98 F (36.7 C), Max:99.2 F (37.3 C)  Recent Labs  Lab 11/28/22 1419 11/28/22 1422 11/29/22 0459 11/29/22 1042 11/30/22 0241 12/01/22 0036 12/03/22 0631  WBC 10.4  --   --  12.1*  --   --   --   CREATININE 1.19*   < > 1.13* 0.98 0.88 0.75 0.77   < > = values in this interval not displayed.     Estimated Creatinine Clearance: 66.5 mL/min (by C-G formula based on SCr of 0.77 mg/dL).    Allergies  Allergen Reactions   Aspirin Other (See Comments)    Reaction: Vasculitis per MD   Codeine Itching and Rash    Tolerated Norco 12/07/20 with no reports of rash/itching. Patient endorsed allergy >30 yr ago and hasn't had reaction since. Climb the walls per pt   Naproxen Nausea Only    Reaction: Makes stomach upset/irritated.   Sulfonamide Derivatives Itching and Rash    4/18 acyclovir >> (5/1) 4/18 vancomycin >> 4/19 4/18 ampicillin >> 4/19  4/18 ceftriaxone >> 4/19  4/18 CSF cx: unable to obtain 4/19 CSF: ngtd 4/19 [employer] panel: VZV   Beck Cofer D. Laney Potash, PharmD, BCPS, BCCCP 12/03/2022, 10:39 AM

## 2022-12-03 NOTE — Progress Notes (Signed)
PHARMACY CONSULT NOTE FOR:  OUTPATIENT  PARENTERAL ANTIBIOTIC THERAPY (OPAT)  Indication: VZV encephalitis Regimen: Acyclovir 650 mg IV every 8 hours + NS bolus of 500 cc over 2 hours to be given twice daily to help maintain hydration End date: 12/10/22  IV antibiotic discharge orders are pended. To discharging provider:  please sign these orders via discharge navigator,  Select New Orders & click on the button choice - Manage This Unsigned Work.     Thank you for allowing pharmacy to be a part of this patient's care.  Georgina Pillion, PharmD, BCPS Infectious Diseases Clinical Pharmacist 12/03/2022 10:22 AM   **Pharmacist phone directory can now be found on [email or web address] ([email or web address]).  Listed under [facility].

## 2022-12-03 NOTE — Plan of Care (Signed)

## 2022-12-03 NOTE — Inpatient Diabetes Management (Signed)
Inpatient Diabetes Program Recommendations  AACE/ADA: New Consensus Statement on Inpatient Glycemic Control (2015)  Target Ranges:  Prepandial:   less than 140 mg/dL      Peak postprandial:   less than 180 mg/dL (1-2 hours)      Critically ill patients:  140 - 180 mg/dL   Lab Results  Component Value Date   GLUCAP 273 (H) 12/03/2022   HGBA1C 9.8 (H) 11/30/2022    Review of Glycemic Control  Latest Reference Range & Units 12/03/22 07:47 12/03/22 11:50  Glucose-Capillary 70 - 99 mg/dL 161 (H) 096 (H)  (H): Data is abnormally high  Diabetes history: DM2 Outpatient Diabetes medications: Tresiba 18 QHS, Humalog 8 units TID, Trulicity weekly, Farxiga 10 QD Current orders for Inpatient glycemic control: Semglee 10 units QD, Novolog 0-15 units TID and Farxiga 10 mg QD  Inpatient Diabetes Program Recommendations:    Please consider:  Novolog 3 units TID with meals if she consumes at least 50%  Will continue to follow while inpatient.  Thank you, Dulce Sellar, MSN, CDCES Diabetes Coordinator Inpatient Diabetes Program [phone] (team pager from 8a-5p)

## 2022-12-03 NOTE — Progress Notes (Signed)
PROGRESS NOTE  Alexis [name]  DOB: April 23, 1951  PCP: Fatima Sanger, FNP YQM:[id number]  DOA: 11/28/2022  LOS: 5 days  Hospital Day: 6  Brief narrative: Alexis [name] is a 72 y.o. female with PMH significant for DM2, HTN, HLD, diabetic neuropathy, gastroparesis, sleep apnea, migraine, anxiety/depression, fibromyalgia, GERD, IBS 4/18, patient was brought to the ED from home with altered mental status and high blood sugar level. She was  recently seen at an urgent care center on 4/15, diagnosed with shingles involving the V1 distribution including the right eye and right side of the face. She progressively became more confused, weak and agitated and hence brought to the ED ultimately.   In the ED, she was hemodynamically stable Labs showed high blood sugar level over 400 and increased anion gap VBG showed pH 7.42, pCO2 40 She was started on insulin drip and IV fluid ED physician attempted LP and it was unsuccessful.   ED physician discussed with Dr. Vanessa Barbara with ophthalmology and recommendation was to treat for presumed zoster with ganciclovir drop if available.   Neurology was consulted and recommendation was for fluoroscopic guided LP with HSV and VZV and start acyclovir. Admitted to [facility] 4/19, IR did LP at L2-L3 level, opening pressure 18 cm H2O.  11 cc of clear colorless CSF was sent.  CSF analysis showed WBC count elevated to 66, total protein elevated to 100. CSF viral panel was positive for VZV  Subjective: Patient was seen and examined this morning.  Sitting up at the edge of the bed.  Not in distress.  States she woke up last night with burning pain at the site of shingles.  Feels better this morning.  She has noticed some pedal edema and has fluid rate was reduced yesterday.  Assessment and plan: Right V1 distribution shingles  Herpes zoster ophthalmicus VZV meningitis/encephalitis Right V1 distribution shingles was diagnosed on 4/15.  Unclear if patient was started  on acyclovir as an outpatient.   Brought in from home with altered mental status in the setting of right V1 dermatome shingles.   CSF viral panel positive for VZV Currently on IV acyclovir.  Neurology and ID consulted appreciated.  Plan for a 14-day IV course.  PICC line ordered to continue rest of the course at home. Currently on IV fluid to minimize renal toxicity of acyclovir.  Renal function is stable so far. Previous hospitalist discussed the case with ophthalmologist Dr Vanessa Barbara. No ganciclovir drop available.  Currently on trifluridine antibiotics per formulary.  Vision remains blurry on the right Needs to follow-up with ophthalmology as an outpatient She is on pregabalin 50 mg twice daily for long-term for peripheral neuropathy.  Complaining of burning pain at the site of shingles. 4/22, I increased pregabalin to 75 mg twice daily. Recent Labs  Lab 11/28/22 1419 11/29/22 1042  WBC 10.4 12.1*   Type 2 diabetes mellitus uncontrolled with hyperglycemia Diabetic neuropathy A1c 9.8 on 11/30/2022 Presented with blood glucose elevated to over 400. PTA on Farxiga 10 mg daily, Trulicity 1.5 mg every Sunday, Tresiba 18 units nightly, Humalog 8 units 3 times daily. Unclear compliance. Currently on semglee 10 units daily and sliding scale insulin.  Blood sugar level control is better today.  Continue to monitor Continue Farxiga. Recent Labs  Lab 12/02/22 0819 12/02/22 1314 12/02/22 1822 12/02/22 2128 12/03/22 0747  GLUCAP 191* 255* 185* 221* 164*   AKI BUN/creatinine improving with hydration.  Continue to maintain hydration while on acyclovir. Recent Labs  11/13/22 0946 11/28/22 1419 11/28/22 1422 11/28/22 2035 11/29/22 0126 11/29/22 0459 11/29/22 1042 11/30/22 0241 12/01/22 0036 12/03/22 0631  BUN 16 34* 40* 40* 41* 38* 35* 22 13 16   CREATININE 0.71 1.19* 0.90 1.37* 1.15* 1.13* 0.98 0.88 0.75 0.77   Hypokalemia/hypomagnesemia Potassium level low at 3.3.  Replacement  given Magnesium level 1.7.  Replacement given. Recent Labs  Lab 11/29/22 0459 11/29/22 1042 11/30/22 0241 12/01/22 0036 12/03/22 0631  K 3.9 3.9 3.6 3.1* 3.3*  MG  --   --   --   --  1.7   Chronic diastolic CHF Essential hypertension  PTA on Toprol 100 mg twice daily, valsartan 160 mg daily, Lasix 40 mg twice daily, Aldactone 12.5 mg daily, Norvasc 5 mg daily Most recent echo from 2022 with EF 65 to 70%, grade 2 diastolic dysfunction Currently on metoprolol 25 mg twice daily and Norvasc 5 mg daily.  Blood pressure elevated this morning.  Increase metoprolol to 50 mg twice daily.  Continue to hold Lasix and Aldactone.  Patient is rather receiving IV fluid to prevent renal injury while on acyclovir.  History of A-fib Per report, amiodarone was stopped by cardiology on 08/2022 due to transaminases.  PTA on Toprol 100 mg twice daily Currently on metoprolol 50 mg twice daily Eliquis resumed 4/20  Elevated LFTs and ammonia History of fatty liver Mild elevation noted Liver ultrasound showed fatty liver Recent Labs  Lab 11/28/22 1419 11/28/22 2035 11/29/22 1042  AST 56*  --  47*  ALT 33  --  29  ALKPHOS 140*  --  115  BILITOT 1.0  --  0.7  PROT 7.1  --  6.3*  ALBUMIN 2.7*  --  2.4*  AMMONIA  --  43*  --   PLT 270  --  184   HLD Lipitor daily  Anxiety/depression/fibromyalgia Insomnia PTA on Effexor 150 mg twice daily, Xanax 0.5 mg nightly as needed, On zaleplon nightly for insomnia Mental status improving.  Effexor and Xanax resumed   Mobility: Encourage ambulation  Goals of care   Code Status: Full Code     DVT prophylaxis:  SCDs Start: 11/28/22 2017 apixaban (ELIQUIS) tablet 5 mg   Antimicrobials: IV acyclovir Fluid: LR at 50 mL/h Consultants: Neurology Family Communication: Husband at bedside  Status: Inpatient Level of care:  Progressive   Needs to continue in-hospital care:  Mental status improving.  Currently on IV acyclovir.  Patient from:  Home Anticipated d/c to: PICC line insertion in process.  Plan to discharge this afternoon versus tomorrow   Diet:  Diet Order             Diet regular Room service appropriate? Yes; Fluid consistency: Thin  Diet effective now                   Scheduled Meds:  ALPRAZolam  0.5 mg Oral QHS   amLODipine  5 mg Oral Daily   apixaban  5 mg Oral BID   Chlorhexidine Gluconate Cloth  6 each Topical Daily   dapagliflozin propanediol  10 mg Oral q AM   erythromycin   Right Eye Q6H   insulin aspart  0-15 Units Subcutaneous TID WC   insulin glargine-yfgn  10 Units Subcutaneous Daily   irbesartan  150 mg Oral Daily   metoprolol tartrate  50 mg Oral BID   ofloxacin  1 drop Right Eye Q4H   pregabalin  75 mg Oral BID   sodium chloride flush  3 mL  Intravenous Q12H   trifluridine  1 drop Right Eye Q2H while awake   venlafaxine XR  150 mg Oral Q breakfast    PRN meds: sodium chloride, acetaminophen **OR** acetaminophen, dextrose, hydrALAZINE, ondansetron **OR** ondansetron (ZOFRAN) IV, senna-docusate, sodium chloride flush   Infusions:   sodium chloride Stopped (11/29/22 1731)   acyclovir 655 mg (12/03/22 0511)   lactated ringers 50 mL/hr at 12/02/22 1859   magnesium sulfate bolus IVPB 2 g (12/03/22 1100)    Antimicrobials: Anti-infectives (From admission, onward)    Start     Dose/Rate Route Frequency Ordered Stop   11/29/22 0800  vancomycin (VANCOREADY) IVPB 750 mg/150 mL  Status:  Discontinued       See Hyperspace for full Linked Orders Report.   750 mg 150 mL/hr over 60 Minutes Intravenous Every 12 hours 11/28/22 1914 11/29/22 1332   11/28/22 2000  ampicillin (OMNIPEN) 2 g in sodium chloride 0.9 % 100 mL IVPB  Status:  Discontinued        2 g 300 mL/hr over 20 Minutes Intravenous Every 4 hours 11/28/22 1859 11/29/22 1332   11/28/22 2000  vancomycin (VANCOREADY) IVPB 1750 mg/350 mL       See Hyperspace for full Linked Orders Report.   1,750 mg 175 mL/hr over 120 Minutes  Intravenous  Once 11/28/22 1914 11/29/22 0109   11/28/22 1915  cefTRIAXone (ROCEPHIN) 2 g in sodium chloride 0.9 % 100 mL IVPB  Status:  Discontinued        2 g 200 mL/hr over 30 Minutes Intravenous Every 12 hours 11/28/22 1858 11/29/22 1332   11/28/22 1730  acyclovir (ZOVIRAX) 655 mg in dextrose 5 % 100 mL IVPB        10 mg/kg  65.3 kg (Adjusted) 113.1 mL/hr over 60 Minutes Intravenous Every 8 hours 11/28/22 1655 12/12/22 1359       Nutritional status:  Body mass index is 30.77 kg/m.          Objective: Vitals:   12/03/22 0414 12/03/22 0750  BP: (!) 146/84 (!) 161/65  Pulse: 76 68  Resp: 20 18  Temp: 98.3 F (36.8 C) 98 F (36.7 C)  SpO2: 97% 92%    Intake/Output Summary (Last 24 hours) at 12/03/2022 1102 Last data filed at 12/03/2022 0414 Gross per 24 hour  Intake 120 ml  Output --  Net 120 ml   Filed Weights   11/28/22 1600  Weight: 81.3 kg   Weight change:  Body mass index is 30.77 kg/m.   Physical Exam: General exam: Pleasant, elderly Caucasian female.  Not in distress. Skin: No rashes, lesions or ulcers. HEENT: Atraumatic, normocephalic, no obvious bleeding. Improving redness from shingles in the V1 dermatome involving the right eye.  Vision blurry on the right eye. Lungs: Clear to auscultation bilaterally CVS: regular rate and rhythm, no murmur GI/Abd soft, nontender, nondistended, bowel sound present CNS: Alert, awake, oriented x 3.  Not restless or agitated Psychiatry: Mood appropriate Extremities: Trace pedal edema, no calf tenderness  Data Review: I have personally reviewed the laboratory data and studies available.  F/u labs ordered Unresulted Labs (From admission, onward)     Start     Ordered   12/04/22 0500  Magnesium  Tomorrow morning,   R       Question:  Specimen collection method  Answer:  Lab=Lab collect   12/03/22 1045   12/03/22 0500  Basic metabolic panel  Every third day,   R     Question:  Specimen collection method  Answer:   Lab=Lab collect   12/01/22 1338   11/28/22 1415  Urinalysis, w/ Reflex to Culture (Infection Suspected) -Urine, Clean Catch  Once,   URGENT       Question:  Specimen Source  Answer:  Urine, Clean Catch   11/28/22 1414            Total time spent in review of labs and imaging, patient evaluation, formulation of plan, documentation and communication with family: 45 minutes  Signed, Lorin Glass, MD [facility] 12/03/2022

## 2022-12-03 NOTE — TOC Initial Note (Addendum)
Transition of Care (TOC) - Initial/Assessment Note  Donn Pierini RN,BSN Transitions of Care Unit 4NP (Non Trauma)- RN Case Manager See Treatment Team for direct Phone #   Patient Details  Name: Alexis [name] MRN: [id number] Date of Birth: 02-Feb-1951  Transition of Care [facility]) CM/SW Contact:    Darrold Span, RN Phone Number: 12/03/2022, 1:35 PM  Clinical Narrative:                 Noted patient will need IV Acyclovir until 12/10/22. OPAT has been placed and PICC line is pending placement.   CM called spouse and discussed home abx need- spouse agreeable to assist  TC made to pt's room and spoke with patient regarding plans for home infusion needs.  Choice offered for HH/infusion needs- pt voiced she did not have a preference and is agreeable to anyone as long as they work with her insurance and can provide the needed services.  Discussed home infusion Pharmacy - [facility]- education needed at bedside prior to going home- pt agreeable and voiced that her spouse will assister her.   Spoke with Pam at [facility] for possible d/c home later today pending PICC- per Pam home infusion will need some time to mix drug and do education- pt would ideally do best if [facility] could be there for first dose in home, ID is also wanting 2hr added IVF pre-drug twice daily - it is a lot for family to be educated on and make sure they have good support in place. Requesting to pull off safe discharge home- should aim for discharge tomorrow after 6am dose and plan for [facility] visit with 2pm dose in the home. Will work on getting education done today and everything delivered to home later for smooth/easy transition tomorrow.  Provider updated, pt aware.  Pam will assist to coordinate [facility] for infusion needs.  1355- per Pam she has arranged [facility] with [facility] for initial home infusion visit for 4/24 between 2-3pm.   TOC will continue to follow to assist with coordinating care  Expected  Discharge Plan: Home w Home Health Services Barriers to Discharge: Other (must enter comment) (Coordinating home IV abx needs)   Patient Goals and CMS Choice Patient states their goals for this hospitalization and ongoing recovery are:: return home with spouse CMS Medicare.gov Compare Post Acute Care list provided to:: Patient Choice offered to / list presented to : Patient      Expected Discharge Plan and Services   Discharge Planning Services: CM Consult Post Acute Care Choice: Home Health Living arrangements for the past 2 months: Single Family Home                 DME Arranged: N/A DME Agency: NA       HH Arranged: RN, IV Antibiotics HH Agency: [facility] Date HH Agency Contacted: 12/03/22 Time HH Agency Contacted: 1100 Representative spoke with at [facility] Agency: Pam  Prior Living Arrangements/Services Living arrangements for the past 2 months: Single Family Home Lives with:: Self, Spouse Patient language and need for interpreter reviewed:: Yes Do you feel safe going back to the place where you live?: Yes      Need for Family Participation in Patient Care: Yes (Comment) Care giver support system in place?: Yes (comment) Current home services: DME (cane, walker, w/chair ,grab bars) Criminal Activity/Legal Involvement Pertinent to Current Situation/Hospitalization: No - Comment as needed  Activities of Daily Living Home Assistive Devices/Equipment: Eyeglasses ADL Screening (condition at time of admission)  Patient's cognitive ability adequate to safely complete daily activities?: No Is the patient deaf or have difficulty hearing?: No Does the patient have difficulty seeing, even when wearing glasses/contacts?: No Does the patient have difficulty concentrating, remembering, or making decisions?: Yes Patient able to express need for assistance with ADLs?: Yes Does the patient have difficulty dressing or bathing?: No Independently performs ADLs?: No Communication:  Independent Dressing (OT): Needs assistance Is this a change from baseline?: Change from baseline, expected to last <3days Grooming: Needs assistance Is this a change from baseline?: Change from baseline, expected to last <3 days Feeding: Independent Bathing: Needs assistance Is this a change from baseline?: Change from baseline, expected to last <3 days Toileting: Needs assistance Is this a change from baseline?: Change from baseline, expected to last <3 days In/Out Bed: Needs assistance Is this a change from baseline?: Change from baseline, expected to last <3 days Walks in Home: Needs assistance Is this a change from baseline?: Change from baseline, expected to last <3 days Does the patient have difficulty walking or climbing stairs?: Yes Weakness of Legs: Both Weakness of Arms/Hands: None  Permission Sought/Granted Permission sought to share information with : Facility Industrial/product designer granted to share information with : Yes, Verbal Permission Granted     Permission granted to share info w AGENCY: HH/Home Infusion pharmacy        Emotional Assessment Appearance:: Appears stated age Attitude/Demeanor/Rapport: Engaged Affect (typically observed): Accepting, Appropriate Orientation: : Oriented to Self, Oriented to Place, Oriented to  Time, Oriented to Situation Alcohol / Substance Use: Not Applicable Psych Involvement: No (comment)  Admission diagnosis:  Dehydration [E86.0] Encephalopathy acute [G93.40] Hyperglycemia [R73.9] Herpes zoster with ophthalmic complication, unspecified herpes zoster eye disease [B02.30] Patient Active Problem List   Diagnosis Date Noted   Encephalopathy acute 11/28/2022   Herpes zoster 11/28/2022   Uncontrolled type 2 diabetes mellitus with hyperglycemia, with long-term current use of insulin 11/28/2022   Diarrhea of presumed infectious origin 10/11/2022   ARF (acute renal failure) 08/18/2021   Transaminitis 08/18/2021    Nephrolithiasis 08/18/2021   Benign neoplasm of colon    Gastric polyps    Heme positive stool    Chronic diarrhea    Anticoagulated    Anemia 07/25/2021   CHF exacerbation 07/24/2021   Acute on chronic respiratory failure with hypoxia 07/24/2021   Chronic diastolic CHF (congestive heart failure) 07/24/2021   Hypokalemia 07/24/2021   New onset of congestive heart failure 04/26/2021   Hypotension 12/07/2020   Acute metabolic encephalopathy 12/07/2020   AF (paroxysmal atrial fibrillation)    Acute blood loss anemia    Subcapital fracture of left femur 12/04/2020   Leukocytosis 12/04/2020   Fall at home, initial encounter 12/04/2020   Diabetic peripheral neuropathy 10/20/2018   Cervical myelopathy 05/07/2018   Type 2 diabetes mellitus 02/18/2018   Memory difficulties 09/18/2017   Coronary artery calcification seen on computed tomography 04/09/2017   Word finding difficulty 09/05/2016   Cervicogenic headache 09/05/2016   Palpitation 08/22/2016   Bilateral leg edema 08/22/2016   Obesity (BMI 30-39.9) 05/25/2014   Precordial chest pain 05/25/2014   Migraine without aura, without mention of intractable migraine without mention of status migrainosus 03/18/2013   Chest pain 01/29/2013   Constipation 02/24/2012   DM2 (diabetes mellitus, type 2) 08/24/2010   Hyperlipidemia 08/24/2010   ANXIETY 08/24/2010   Anxiety and depression 08/24/2010   Essential hypertension 08/24/2010   GERD 08/24/2010   IRRITABLE BOWEL SYNDROME 08/24/2010   FATTY LIVER  DISEASE 08/24/2010   ARTHRITIS 08/24/2010   Fibromyalgia 08/24/2010   OSA (obstructive sleep apnea) 08/24/2010   ABDOMINAL PAIN-RUQ 08/24/2010   UTERINE CANCER, HX OF 08/24/2010   GASTRITIS, HX OF 08/24/2010   PCP:  Fatima Sanger, FNP Pharmacy:   [id number] - [place], [place] - [address] [place] [address] Phone: [phone] Fax: [phone]     Social Determinants of Health  (SDOH) Social History: SDOH Screenings   Food Insecurity: No Food Insecurity (11/28/2022)  Housing: Low Risk  (11/28/2022)  Transportation Needs: No Transportation Needs (11/28/2022)  Utilities: Not At Risk (11/28/2022)  Alcohol Screen: Low Risk  (04/27/2021)  Financial Resource Strain: Low Risk  (04/27/2021)  Tobacco Use: Low Risk  (11/28/2022)   SDOH Interventions:     Readmission Risk Interventions    07/25/2021    4:31 PM  Readmission Risk Prevention Plan  Transportation Screening Complete  PCP or Specialist Appt within 3-5 Days Complete  HRI or Home Care Consult Complete  Social Work Consult for Recovery Care Planning/Counseling Complete  Palliative Care Screening Not Applicable  Medication Review Oceanographer) Complete

## 2022-12-04 DIAGNOSIS — G9340 Encephalopathy, unspecified: Secondary | ICD-10-CM | POA: Diagnosis not present

## 2022-12-04 LAB — GLUCOSE, CAPILLARY
Glucose-Capillary: 273 mg/dL — ABNORMAL HIGH (ref 70–99)
Glucose-Capillary: 118 mg/dL — ABNORMAL HIGH (ref 70–99)

## 2022-12-04 LAB — MAGNESIUM: Magnesium: 2.3 mg/dL (ref 1.7–2.4)

## 2022-12-04 MED ORDER — ERYTHROMYCIN 5 MG/GM OP OINT: TOPICAL_OINTMENT | Freq: Four times a day (QID) | OPHTHALMIC | 0 refills | Status: AC

## 2022-12-04 MED ORDER — INSULIN GLARGINE-YFGN 100 UNIT/ML ~~LOC~~ SOLN: 18.0000 [IU] | Freq: Every day | SUBCUTANEOUS | Status: DC

## 2022-12-04 MED ORDER — TRIFLURIDINE 1 % OP SOLN: 1.0000 [drp] | OPHTHALMIC | 0 refills | Status: AC

## 2022-12-04 MED ORDER — METOPROLOL SUCCINATE ER 50 MG PO TB24: 50.0000 mg | ORAL_TABLET | Freq: Every day | ORAL | 2 refills | Status: AC

## 2022-12-04 MED ORDER — INSULIN GLARGINE-YFGN 100 UNIT/ML ~~LOC~~ SOLN
8.0000 [IU] | Freq: Once | SUBCUTANEOUS | Status: DC
  Filled 2022-12-04: qty 0.08

## 2022-12-04 NOTE — Discharge Summary (Signed)
Physician Discharge Summary  Alexis [name] ZOX:[id number] DOB: 1951/03/16 DOA: 11/28/2022  PCP: Fatima Sanger, FNP  Admit date: 11/28/2022 Discharge date: 12/04/2022  Admitted From: Home Discharge disposition: Home with home health  Recommendations at discharge:  IV acyclovir to continue for another week to complete 2 weeks course. Continue eye drop and eye ointment as advised.  Follow-up with ophthalmology as an outpatient for right eye blurriness and pain related to shingles. Your blood pressure/heart failure medicines have been adjusted.  Lasix and Aldactone to remain on hold while on acyclovir.  Continue to monitor blood pressure at home.  Follow-up with cardiologist as an outpatient for further adjustments.  Brief narrative: Alexis [name] is a 72 y.o. female with PMH significant for DM2, HTN, HLD, diabetic neuropathy, gastroparesis, sleep apnea, migraine, anxiety/depression, fibromyalgia, GERD, IBS 4/18, patient was brought to the ED from home with altered mental status and high blood sugar level. She was  recently seen at an urgent care center on 4/15, diagnosed with shingles involving the V1 distribution including the right eye and right side of the face. She progressively became more confused, weak and agitated and hence brought to the ED ultimately.   In the ED, she was hemodynamically stable Labs showed high blood sugar level over 400 and increased anion gap VBG showed pH 7.42, pCO2 40 She was started on insulin drip and IV fluid ED physician attempted LP and it was unsuccessful.   ED physician discussed with Dr. Vanessa Barbara with ophthalmology and recommendation was to treat for presumed zoster with ganciclovir drop if available.   Neurology was consulted and recommendation was for fluoroscopic guided LP with HSV and VZV and start acyclovir. Admitted to [facility] 4/19, IR did LP at L2-L3 level, opening pressure 18 cm H2O.  11 cc of clear colorless CSF was sent.  CSF analysis  showed WBC count elevated to 66, total protein elevated to 100. CSF viral panel was positive for VZV  Subjective: Patient was seen and examined this morning.  Sitting up in recliner.  Not in distress.  Much better look shingles in her V1 dermatome.  Blurring of vision continues. Husband at bedside, waiting to learn IV infusion at home. Plan for discharge today.  Assessment and plan: Right V1 distribution shingles  Herpes zoster ophthalmicus VZV meningitis/encephalitis Right V1 distribution shingles was diagnosed on 4/15.  Unclear if patient was started on acyclovir as an outpatient.   Brought in from home with altered mental status in the setting of right V1 dermatome shingles.   CSF viral panel positive for VZV Currently on IV acyclovir.  Neurology and ID consulted appreciated.  Plan for a 14-day IV course.  PICC line placed 4/23.   Rest of the course of IV acyclovir to complete at home. Previous hospitalist discussed the case with ophthalmologist Dr Vanessa Barbara. No ganciclovir drop available.  Currently on trifluridine drops and erythromycin ointment per formulary.  Vision remains blurry on the right Needs to follow-up with ophthalmology as an outpatient She is on pregabalin 50 mg twice daily for long-term for peripheral neuropathy.  Complaining of burning pain at the site of shingles. 4/22, I increased pregabalin to 75 mg twice daily.  Continue increased dose at home. Recent Labs  Lab 11/28/22 1419 11/29/22 1042  WBC 10.4 12.1*   Type 2 diabetes mellitus uncontrolled with hyperglycemia Diabetic neuropathy A1c 9.8 on 11/30/2022 Presented with blood glucose elevated to over 400. PTA on Farxiga 10 mg daily, Tresiba 18 units nightly, Humalog 8 units  3 times daily. Resume the same regimen at home.  AKI BUN/creatinine improving with hydration.  She was maintained on IV hydration while on acyclovir. Recent Labs    11/13/22 0946 11/28/22 1419 11/28/22 1422 11/28/22 2035 11/29/22 0126  11/29/22 0459 11/29/22 1042 11/30/22 0241 12/01/22 0036 12/03/22 0631  BUN 16 34* 40* 40* 41* 38* 35* CREATININE 0.71 1.19* 0.90 1.37* 1.15* 1.13* 0.98 0.88 0.75 0.77   Hypokalemia/hypomagnesemia Potassium and magnesium levels improved with replacement of electrolytes  Chronic diastolic CHF Essential hypertension  PTA on Toprol 100 mg twice daily (??), valsartan 160 mg daily, Norvasc 5 mg daily, Lasix 40 mg twice daily, Aldactone 12.5 mg daily,  Most recent echo from 2022 with EF 65 to 70%, grade 2 diastolic dysfunction. Lasix and Aldactone remain on hold while on acyclovir. Blood pressure improved.  Heart rate in 60s.  I would discharge on metoprolol succinate 50 mg daily and continue same dose of valsartan and Norvasc. I have advised patient to continue to hold Lasix and Aldactone till she remains on acyclovir. I have sent a message to her cardiologist Dr. Rennis Golden about the adjustments made in the hospital.  She will need a follow-up with him as an outpatient.  History of A-fib Per report, amiodarone was stopped by cardiology on 08/2022 due to transaminases.  Discharged on reduced dose of metoprolol dose as above. Continue Eliquis.  Elevated LFTs and ammonia History of fatty liver Mild elevation noted Liver ultrasound showed fatty liver Recent Labs  Lab 11/28/22 1419 11/28/22 2035 11/29/22 1042  AST 56*  --  47*  ALT 33  --  29  ALKPHOS 140*  --  115  BILITOT 1.0  --  0.7  PROT 7.1  --  6.3*  ALBUMIN 2.7*  --  2.4*  AMMONIA  --  43*  --   PLT 270  --  184   HLD Lipitor daily  Anxiety/depression/fibromyalgia Insomnia PTA on Effexor 150 mg twice daily, Xanax 0.5 mg nightly as needed, On zaleplon nightly for insomnia  Goals of care   Code Status: Full Code   Wounds:  -    Discharge Exam:   Vitals:   12/03/22 0750 12/03/22 2346 12/04/22 0300 12/04/22 0839  BP: (!) 161/65 (!) 153/72 (!) 144/72 (!) 142/57  Pulse: 68 69 98 64  Resp: Temp: 98 F (36.7 C) 98.5 F (36.9 C) 97.7 F (36.5 C) 98 F (36.7 C)  TempSrc: Tympanic Oral Oral Oral  SpO2: 92% 92% 92% 97%  Weight:      Height:        Body mass index is 30.77 kg/m.   General exam: Pleasant, elderly Caucasian female.  Not in distress. Skin: No rashes, lesions or ulcers. HEENT: Atraumatic, normocephalic, no obvious bleeding.  Much improving redness from shingles in the V1 dermatome involving the right eye.  Vision blurry on the right eye continues. Lungs: Clear to auscultation bilaterally CVS: regular rate and rhythm, no murmur GI/Abd soft, nontender, nondistended, bowel sound present CNS: Alert, awake, oriented x 3.  Not restless or agitated Psychiatry: Mood appropriate Extremities: No pedal edema, no calf tenderness  Follow ups:    Follow-up Information     [facility] Follow up.   Why: Home IV abx needs arranged- they will provide education at bedside prior to discharge and deliver abx/supplies to home Contact information: [phone]        [facility] Follow up.  Why: HHRN arranged for home IV abx needs- they will plan to see you for in home visit 4/24 between 2-3pm- they will contact you prior to visit Contact information: [address] [place], [place] [address] Phone: [phone]        Fatima Sanger, FNP Follow up.   Specialty: Internal Medicine Contact information: [address] [place] [address] [phone]                 Discharge Instructions:   Discharge Instructions     [facility] pharmacist to adjust dose for Vancomycin, Aminoglycosides and other anti-infective therapies as requested by physician.   Complete by: As directed    [facility] to provide Cath Flo 2mg    Complete by: As directed    Administer for PICC line occlusion and as ordered by physician for other access device issues.   Anaphylaxis Kit: Provided to treat any  anaphylactic reaction to the medication being provided to the patient if First Dose or when requested by physician   Complete by: As directed    Epinephrine 1mg /ml vial / amp: Administer 0.3mg  (0.7ml) subcutaneously once for moderate to severe anaphylaxis, nurse to call physician and pharmacy when reaction occurs and call 911 if needed for immediate care   Diphenhydramine 50mg /ml IV vial: Administer 25-50mg  IV/IM PRN for first dose reaction, rash, itching, mild reaction, nurse to call physician and pharmacy when reaction occurs   Sodium Chloride 0.9% NS IV: Administer if needed for hypovolemic blood pressure drop or as ordered by physician after call to physician with anaphylactic reaction   Call MD for:  difficulty breathing, headache or visual disturbances   Complete by: As directed    Call MD for:  extreme fatigue   Complete by: As directed    Call MD for:  hives   Complete by: As directed    Call MD for:  persistant dizziness or light-headedness   Complete by: As directed    Call MD for:  persistant nausea and vomiting   Complete by: As directed    Call MD for:  severe uncontrolled pain   Complete by: As directed    Call MD for:  temperature >100.4   Complete by: As directed    Change dressing on IV access line weekly and PRN   Complete by: As directed    Diet - low sodium heart healthy   Complete by: As directed    Diet Carb Modified   Complete by: As directed    Discharge instructions   Complete by: As directed    Recommendations at discharge:   IV acyclovir to continue for another week to complete 2 weeks course.  Continue eye drop and eye ointment as advised.  Follow-up with ophthalmology as an outpatient for right eye blurriness and pain related to shingles.  Your blood pressure/heart failure medicines have been adjusted.  Lasix and Aldactone to remain on hold while on acyclovir.  Continue to monitor blood pressure at home.  Follow-up with cardiologist as an outpatient  for further adjustments.  Discharge instructions for diabetes mellitus: Check blood sugar 3 times a day and bedtime at home. If blood sugar running above 200 or less than 70 please call your MD to adjust insulin. If you notice signs and symptoms of hypoglycemia (low blood sugar) like jitteriness, confusion, thirst, tremor and sweating, please check blood sugar, drink sugary drink/biscuits/sweets to increase sugar level and call MD or return to ER.  Discharge instructions for CHF Check weight daily -preferably same time every day. Restrict fluid intake to 1200 ml daily Restrict salt intake to less than 2 g daily. Call MD if you have one of the following symptoms 1) 3 pound weight gain in 24 hours or 5 pounds in 1 week  2) swelling in the hands, feet or stomach  3) progressive shortness of breath 4) if you have to sleep on extra pillows at night in order to breathe     General discharge instructions: Follow with Primary MD Fatima Sanger, FNP in 7 days  Please request your PCP  to go over your hospital tests, procedures, radiology results at the follow up. Please get your medicines reviewed and adjusted.  Your PCP may decide to repeat certain labs or tests as needed. Do not drive, operate heavy machinery, perform activities at heights, swimming or participation in water activities or provide baby sitting services if your were admitted for syncope or siezures until you have seen by Primary MD or a Neurologist and advised to do so again. [place] Controlled Substance Reporting System database was reviewed. Do not drive, operate heavy machinery, perform activities at heights, swim, participate in water activities or provide baby-sitting services while on medications for pain, sleep and mood until your outpatient physician has reevaluated you and advised to do so again.  You are strongly recommended to comply with the dose, frequency and duration of prescribed medications. Activity:  As tolerated with Full fall precautions use walker/cane & assistance as needed Avoid using any recreational substances like cigarette, tobacco, alcohol, or non-prescribed drug. If you experience worsening of your admission symptoms, develop shortness of breath, life threatening emergency, suicidal or homicidal thoughts you must seek medical attention immediately by calling 911 or calling your MD immediately  if symptoms less severe. You must read complete instructions/literature along with all the possible adverse reactions/side effects for all the medicines you take and that have been prescribed to you. Take any new medicine only after you have completely understood and accepted all the possible adverse reactions/side effects.  Wear Seat belts while driving. You were cared for by a hospitalist during your hospital stay. If you have any questions about your discharge medications or the care you received while you were in the hospital after you are discharged, you can call the unit and ask to speak with the hospitalist or the covering physician. Once you are discharged, your primary care physician will handle any further medical issues. Please note that NO REFILLS for any discharge medications will be authorized once you are discharged, as it is imperative that you return to your primary care physician (or establish a relationship with a primary care physician if you do not have one).   Discharge wound care:   Complete by: As directed    Flush IV access with Sodium Chloride 0.9% and Heparin 10 units/ml or 100 units/ml   Complete by: As directed    Home infusion instructions - [facility]   Complete by: As directed    Instructions: Flush IV access with Sodium Chloride 0.9% and Heparin 10units/ml or 100units/ml   Change dressing on IV access line: Weekly and PRN   Instructions Cath Flo 2mg : Administer for PICC Line occlusion and as ordered by physician for other access device   [facility] pharmacist to adjust dose for: Vancomycin, Aminoglycosides and other anti-infective therapies as requested by physician   Increase activity slowly   Complete by: As directed  Method of administration may be changed at the discretion of home infusion pharmacist based upon assessment of the patient and/or caregiver's ability to self-administer the medication ordered   Complete by: As directed        Discharge Medications:   Allergies as of 12/04/2022       Reactions   Aspirin Other (See Comments)   Reaction: Vasculitis per MD   Codeine Itching, Rash   Tolerated Norco 12/07/20 with no reports of rash/itching. Patient endorsed allergy >30 yr ago and hasn't had reaction since. Climb the walls per pt   Naproxen Nausea Only   Reaction: Makes stomach upset/irritated.   Sulfonamide Derivatives Itching, Rash        Medication List     STOP taking these medications    furosemide 20 MG tablet Commonly known as: LASIX   spironolactone 25 MG tablet Commonly known as: ALDACTONE   Trulicity 1.5 MG/0.5ML Sopn Generic drug: Dulaglutide       TAKE these medications    acetaminophen 325 MG tablet Commonly known as: TYLENOL Take 2 tablets (650 mg total) by mouth every 6 (six) hours as needed for mild pain or moderate pain. What changed:  how much to take reasons to take this   Aciphex 20 MG tablet Generic drug: RABEprazole Take 20 mg by mouth in the morning and at bedtime.   acyclovir 50 MG/ML injection Commonly known as: ZOVIRAX Inject 13 mLs (650 mg total) into the vein every 8 (eight) hours for 7 days. Indication:  VZV encephalitis First Dose: Yes Last Day of Therapy:  12/10/22 Labs - Once weekly:  CBC/D Labs - Twice weekly: BMP Method of administration: CADD pump - per home health protocol Pull PICC/midline after the completion of Acyclovir therapy Method of administration may be changed at the discretion of home infusion pharmacist based upon assessment of the  patient and/or caregiver's ability to self-administer the medication ordered.   ALPRAZolam 0.5 MG tablet Commonly known as: XANAX Take 0.5 mg by mouth at bedtime.   amLODipine 5 MG tablet Commonly known as: NORVASC Take 1 tablet (5 mg total) by mouth daily.   apixaban 5 MG Tabs tablet Commonly known as: ELIQUIS Take 1 tablet (5 mg total) by mouth 2 (two) times daily.   atorvastatin 20 MG tablet Commonly known as: LIPITOR Take 1 tablet (20 mg total) by mouth daily.   b complex vitamins tablet Take 1 tablet by mouth at bedtime.   erythromycin ophthalmic ointment Place into the right eye every 6 (six) hours for 14 days.   Farxiga 10 MG Tabs tablet Generic drug: dapagliflozin propanediol Take 10 mg by mouth in the morning.   ferrous sulfate 325 (65 FE) MG tablet Take 1 tablet (325 mg total) by mouth every other day.   fish oil-omega-3 fatty acids 1000 MG capsule Take 1 g by mouth at bedtime.   insulin lispro 100 UNIT/ML KwikPen Commonly known as: HUMALOG Inject 8 Units into the skin 3 (three) times daily.   metoprolol succinate 50 MG 24 hr tablet Commonly known as: TOPROL-XL Take 1 tablet (50 mg total) by mouth daily. Take with or immediately following a meal. What changed:  how much to take when to take this   pantoprazole 40 MG tablet Commonly known as: PROTONIX Take 1 tablet (40 mg total) by mouth 2 (two) times daily.   pregabalin 50 MG capsule Commonly known as: LYRICA Take 50 mg by mouth 2 (two) times daily.   sodium chloride 0.9 %  infusion Inject 500 mLs into the vein 2 (two) times daily for 7 days. Give a 500 cc bolus over 2 hours at a rate of 250 cc/hr twice daily for the duration of treatment with Acyclovir thru 12/10/22   Evaristo Bury FlexTouch 100 UNIT/ML FlexTouch Pen Generic drug: insulin degludec Inject 18 Units into the skin at bedtime.   trifluridine 1 % ophthalmic solution Commonly known as: VIROPTIC Place 1 drop into the right eye every 2 (two)  hours while awake for 14 days.   valsartan 160 MG tablet Commonly known as: DIOVAN Take 1 tablet (160 mg total) by mouth daily.   venlafaxine XR 150 MG 24 hr capsule Commonly known as: EFFEXOR-XR Take 150 mg by mouth 2 (two) times daily.   zaleplon 5 MG capsule Commonly known as: SONATA Take 5 mg by mouth at bedtime.               Discharge Care Instructions  (From admission, onward)           Start     Ordered   12/04/22 0000  Discharge wound care:        12/04/22 1133   12/03/22 0000  Change dressing on IV access line weekly and PRN  (Home infusion instructions - [facility] )        12/03/22 1234             The results of significant diagnostics from this hospitalization (including imaging, microbiology, ancillary and laboratory) are listed below for reference.    Procedures and Diagnostic Studies:   DG FL GUIDED LUMBAR PUNCTURE  Result Date: 11/29/2022 CLINICAL DATA:  Patient admitted for acute altered mental status with active zoster. Request received to perform diagnostic fluoroscopic guided lumbar puncture to evaluate for encephalitis. EXAM: DIAGNOSTIC LUMBAR PUNCTURE UNDER FLUOROSCOPIC GUIDANCE COMPARISON:  CT head 11/28/2022 FLUOROSCOPY: Radiation Exposure Index (as provided by the fluoroscopic device): 13.40 mGy Kerma PROCEDURE: Informed consent was obtained from the patient's daughter prior to the procedure, including potential complications of headache, allergy, and pain. With the patient prone, the lower back was prepped with Betadine. 1% Lidocaine was used for local anesthesia. Lumbar puncture was performed at the L2-L3 level using a 20 gauge needle with return of clear, colorless CSF with an opening pressure of 18 cm water. 11 ml of CSF were obtained for laboratory studies. The patient tolerated the procedure well and there were no apparent complications. Procedure was performed by Alex Gardener, NP and supervised by Sebastian Ache, MD.  IMPRESSION: Technically successful fluoroscopic guided lumbar puncture. Read by: Alex Gardener, AGNP-BC Electronically Signed   By: Sebastian Ache M.D.   On: 11/29/2022 09:38   DG Chest Portable 1 View  Result Date: 11/28/2022 CLINICAL DATA:  Altered mental status. EXAM: PORTABLE CHEST 1 VIEW COMPARISON:  06/04/2022 FINDINGS: Normal heart size. Stable mediastinal contours. Aortic atherosclerosis. No convincing pleural effusion, small pleural effusions on prior exam or seen only on the lateral view. No focal airspace disease, pulmonary edema, or pneumothorax. No acute osseous findings. IMPRESSION: No acute chest findings. Electronically Signed   By: Narda Rutherford M.D.   On: 11/28/2022 15:37   CT Head Wo Contrast  Result Date: 11/28/2022 CLINICAL DATA:  Headache, new onset. EXAM: CT HEAD WITHOUT CONTRAST TECHNIQUE: Contiguous axial images were obtained from the base of the skull through the vertex without intravenous contrast. RADIATION DOSE REDUCTION: This exam was performed according to the departmental dose-optimization program which includes automated exposure control, adjustment of the  mA and/or kV according to patient size and/or use of iterative reconstruction technique. COMPARISON:  Head CT 08/16/2022. FINDINGS: Brain: The inferior portion of the posterior fossa and foramen magnum are excluded from the coverage of the exam. Within this limitation, no acute intracranial hemorrhage. Gray-white differentiation is preserved. No hydrocephalus or extra-axial collection. No mass effect or midline shift. Vascular: No hyperdense vessel or unexpected calcification. Skull: No calvarial fracture or suspicious bone lesion. Skull base is unremarkable. Sinuses/Orbits: Unremarkable. Other: None. IMPRESSION: The inferior portion of the posterior fossa and foramen magnum are excluded from the coverage of the exam. Within this limitation, no acute intracranial abnormality. Electronically Signed   By: Orvan Falconer M.D.    On: 11/28/2022 14:54     Labs:   Basic Metabolic Panel: Recent Labs  Lab 11/29/22 0459 11/29/22 1042 11/30/22 0241 12/01/22 0036 12/03/22 0631 12/04/22 0351  NA 137 136 134* 133* 136  --   K 3.9 3.9 3.6 3.1* 3.3*  --   CL 99 100 99 97* 98  --   CO2 25 24 22 28 29   --   GLUCOSE 140* 177* 260* 191* 167*  --   BUN 38* 35* 22 13 16   --   CREATININE 1.13* 0.98 0.88 0.75 0.77  --   CALCIUM 9.0 8.6* 8.6* 8.2* 8.7*  --   MG  --   --   --   --  1.7 2.3   GFR Estimated Creatinine Clearance: 66.5 mL/min (by C-G formula based on SCr of 0.77 mg/dL). Liver Function Tests: Recent Labs  Lab 11/28/22 1419 11/29/22 1042  AST 56* 47*  ALT 33 29  ALKPHOS 140* 115  BILITOT 1.0 0.7  PROT 7.1 6.3*  ALBUMIN 2.7* 2.4*   No results for input(s): "LIPASE", "AMYLASE" in the last 168 hours. Recent Labs  Lab 11/28/22 2035  AMMONIA 43*   Coagulation profile No results for input(s): "INR", "PROTIME" in the last 168 hours.  CBC: Recent Labs  Lab 11/28/22 1419 11/28/22 1422 11/29/22 1042  WBC 10.4  --  12.1*  NEUTROABS 8.8*  --   --   HGB 16.8* 17.7*  17.7* 15.2*  HCT 50.7* 52.0*  52.0* 46.2*  MCV 86.2  --  86.0  PLT 270  --  184   Cardiac Enzymes: No results for input(s): "CKTOTAL", "CKMB", "CKMBINDEX", "TROPONINI" in the last 168 hours. BNP: Invalid input(s): "POCBNP" CBG: Recent Labs  Lab 12/03/22 0747 12/03/22 1150 12/03/22 1753 12/03/22 2123 12/04/22 0835  GLUCAP 164* 273* 287* 219* 273*   D-Dimer No results for input(s): "DDIMER" in the last 72 hours. Hgb A1c No results for input(s): "HGBA1C" in the last 72 hours. Lipid Profile No results for input(s): "CHOL", "HDL", "LDLCALC", "TRIG", "CHOLHDL", "LDLDIRECT" in the last 72 hours. Thyroid function studies No results for input(s): "TSH", "T4TOTAL", "T3FREE", "THYROIDAB" in the last 72 hours.  Invalid input(s): "FREET3" Anemia work up No results for input(s): "VITAMINB12", "FOLATE", "FERRITIN", "TIBC",  "IRON", "RETICCTPCT" in the last 72 hours. Microbiology Recent Results (from the past 240 hour(s))  VZV PCR, CSF     Status: None   Collection Time: 11/28/22  3:48 PM   Specimen: Cerebrospinal Fluid  Result Value Ref Range Status   VZV PCR, CSF Positive Negative Final    Comment: CRITICAL RESULT CALLED TO, READ BACK BY AND VERIFIED WITH: T,KRAMER RN @1406  11/30/22 E,BENTON (NOTE) Varicella Zoster Virus DNA detected. Called/faxed to [facility], LAB on 11/30/2022 at 14:06 ET for test(s) VZV PCR, CSF Performed  At: [facility] [address] [place], [place] [id number] Jolene Schimke MD WU:[id number] Performed at [facility], [address], [place], [place] [address]   CSF culture w Gram Stain     Status: None   Collection Time: 11/29/22  9:13 AM   Specimen: PATH Cytology CSF; Cerebrospinal Fluid  Result Value Ref Range Status   Specimen Description CSF  Final   Special Requests NONE  Final   Gram Stain   Final    WBC PRESENT, PREDOMINANTLY MONONUCLEAR NO ORGANISMS SEEN CYTOSPIN SMEAR    Culture   Final    NO GROWTH 3 DAYS Performed at [facility], [address], [place], [place] [address]    Report Status 12/02/2022 FINAL  Final    Time coordinating discharge: 45 minutes  Signed: Thos Matsumoto  [facility] 12/04/2022, 11:33 AM

## 2022-12-04 NOTE — TOC Transition Note (Signed)
Transition of Care (TOC) - CM/SW Discharge Note Donn Pierini RN,BSN Transitions of Care Unit 4NP (Non Trauma)- RN Case Manager See Treatment Team for direct Phone #   Patient Details  Name: Alexis [name] MRN: [id number] Date of Birth: 05/30/1951  Transition of Care [facility]) CM/SW Contact:  Darrold Span, RN Phone Number: 12/04/2022, 2:29 PM   Clinical Narrative:    PICC line has been placed  Follow up done this am with [facility] pharmacy liaison- Pam- per conversation she spent time last evening educating husband however feels like he needs more education for a safe discharge home- Pam plans to return later this AM to try and complete education and if it goes well then pt will be able to discharge home as planned with a planned visit by [facility] this afternoon.   1250- have received msg from [facility] that education has been completed and husband did well - pt can discharge with home IV abx plan in place and husband to assist. Daughter will also be back from beach later tonight and can support as well.   MD has placed d/c order and pt ready to transition home.    Final next level of care: Home w Home Health Services Barriers to Discharge: Barriers Resolved   Patient Goals and CMS Choice CMS Medicare.gov Compare Post Acute Care list provided to:: Patient Choice offered to / list presented to : Patient  Discharge Placement                 Home w/ [facility]        Discharge Plan and Services Additional resources added to the After Visit Summary for     Discharge Planning Services: CM Consult Post Acute Care Choice: Home Health          DME Arranged: N/A DME Agency: NA       HH Arranged: RN, IV Antibiotics HH Agency: [facility] Date HH Agency Contacted: 12/03/22 Time HH Agency Contacted: 1100 Representative spoke with at [facility] Agency: Pam  Social Determinants of Health (SDOH) Interventions SDOH Screenings   Food Insecurity: No Food Insecurity (11/28/2022)   Housing: Low Risk  (11/28/2022)  Transportation Needs: No Transportation Needs (11/28/2022)  Utilities: Not At Risk (11/28/2022)  Alcohol Screen: Low Risk  (04/27/2021)  Financial Resource Strain: Low Risk  (04/27/2021)  Tobacco Use: Low Risk  (11/28/2022)     Readmission Risk Interventions    12/04/2022    2:29 PM 07/25/2021    4:31 PM  Readmission Risk Prevention Plan  Transportation Screening Complete Complete  PCP or Specialist Appt within 3-5 Days Complete Complete  HRI or Home Care Consult Complete Complete  Social Work Consult for Recovery Care Planning/Counseling Complete Complete  Palliative Care Screening Not Applicable Not Applicable  Medication Review Oceanographer) Complete Complete

## 2022-12-09 ENCOUNTER — Telehealth: Payer: Self-pay

## 2022-12-09 ENCOUNTER — Other Ambulatory Visit: Payer: Self-pay

## 2022-12-09 ENCOUNTER — Encounter: Payer: Self-pay | Admitting: Infectious Disease

## 2022-12-09 ENCOUNTER — Ambulatory Visit (INDEPENDENT_AMBULATORY_CARE_PROVIDER_SITE_OTHER): Payer: Medicare Other | Admitting: Infectious Disease

## 2022-12-09 VITALS — BP 164/77 | HR 65 | Temp 97.7°F | Ht 64.0 in | Wt 179.0 lb

## 2022-12-09 DIAGNOSIS — B0230 Zoster ocular disease, unspecified: Secondary | ICD-10-CM | POA: Diagnosis not present

## 2022-12-09 DIAGNOSIS — G9341 Metabolic encephalopathy: Secondary | ICD-10-CM

## 2022-12-09 DIAGNOSIS — B027 Disseminated zoster: Secondary | ICD-10-CM | POA: Diagnosis not present

## 2022-12-09 DIAGNOSIS — J9621 Acute and chronic respiratory failure with hypoxia: Secondary | ICD-10-CM

## 2022-12-09 HISTORY — DX: Zoster ocular disease, unspecified: B02.30

## 2022-12-09 NOTE — Progress Notes (Signed)
Subjective:  Chief complaint: severe left eye pain, headaches   Patient ID: Alexis [name], female    DOB: 1951-04-06, 72 y.o.   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  On-call ophthalmologist was contacted regarding her care and had recommendation with regards to various eyedrops and that the patient be seen by ophthalmology as an outpatient.  She was seen by my partner [name] and set up with acyclovir to complete 2 weeks of IV therapy.  Does not appear that outpatient ophthalmology was ever arranged for the patient.  She was scheduled to see [name] tomorrow but had such severe eye pain last night and agitation that they called 911 and EMS came and assessed the patient.  She ultimately rescheduled to see me today on urgent basis.  Her daughter had made an appointment with optometry which is not sufficient obviously to exclude deep infection in the eye.  Her vision certainly has improved she tells me but she still has blurry vision, The eye pain is at times excruciating.    Past Medical History:  Diagnosis Date   Anxiety    Arthritis    "knees" (05/25/2014)   Chronic back pain    Depression    Diabetes mellitus type II    Diabetic peripheral neuropathy (HCC) 10/20/2018   Diverticulitis    Fatty liver    Fibromyalgia    Gastric polyp    hyperplastic   Gastroparesis    "recently dx'd" (05/25/2014)   GERD (gastroesophageal reflux disease)    H/O hiatal hernia    Hx of gastritis    Hyperlipidemia    Hypertension    Irritable bowel syndrome (IBS)    Memory difficulties 09/18/2017   Migraine without aura, without mention of intractable migraine without mention of status migrainosus    "related to allergies; have them in the spring and fall" (05/25/2014)   Obesity     Sleep apnea    "wore mask; took it off in my sleep; quit wearing it" (05/25/2014)   Uterine cancer (HCC) dx'd 2000   surg only   Vasculitis (HCC)    "irritates my legs"   Wears glasses    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    Past Surgical History:  Procedure Laterality Date   ANTERIOR CERVICAL DECOMPRESSION/DISCECTOMY FUSION 4 LEVELS N/A 05/07/2018   Procedure: ANTERIOR CERVICAL DECOMPRESSION FUSION, CERVICAL 4-5,CERVICAL 5-6, CERVICAL 6-7 WITH INSTRUMENTATION AND ALLOGRAFT;  Surgeon: Estill Bamberg, MD;  Location: [facility] OR;  Service: Orthopedics;  Laterality: N/A;   APPENDECTOMY  ~ 1967   BIOPSY  07/27/2021   Procedure: BIOPSY;  Surgeon: Benancio Deeds, MD;  Location: [facility] ENDOSCOPY;  Service: Gastroenterology;;   BREAST CYST EXCISION Right 1990   COLONOSCOPY     COLONOSCOPY WITH PROPOFOL N/A 07/27/2021   Procedure: COLONOSCOPY WITH PROPOFOL;  Surgeon: Benancio Deeds, MD;  Location: [facility] ENDOSCOPY;  Service: Gastroenterology;  Laterality: N/A;   ESOPHAGOGASTRODUODENOSCOPY (EGD) WITH PROPOFOL N/A 07/27/2021   Procedure: ESOPHAGOGASTRODUODENOSCOPY (EGD) WITH PROPOFOL;  Surgeon: Benancio Deeds, MD;  Location: [facility] ENDOSCOPY;  Service: Gastroenterology;  Laterality: N/A;   HEMOSTASIS CLIP PLACEMENT  07/27/2021   Procedure: HEMOSTASIS CLIP PLACEMENT;  Surgeon: Benancio Deeds, MD;  Location: [facility] ENDOSCOPY;  Service: Gastroenterology;;   LAPAROSCOPIC CHOLECYSTECTOMY  1990   LEFT HEART CATHETERIZATION WITH  CORONARY ANGIOGRAM N/A 05/26/2014   Procedure: LEFT HEART CATHETERIZATION WITH CORONARY ANGIOGRAM;  Surgeon: Lennette Bihari, MD;  Location: [facility] CATH LAB;  Service: Cardiovascular;  Laterality: N/A;   POLYPECTOMY  07/27/2021   Procedure: POLYPECTOMY;  Surgeon: Benancio Deeds, MD;  Location: [facility] ENDOSCOPY;  Service: Gastroenterology;;   [name]  07/27/2021   Procedure: [name];  Surgeon: Benancio Deeds, MD;  Location: [facility] ENDOSCOPY;  Service: Gastroenterology;;    SINUS SURGERY WITH INSTATRAK  2000   TOTAL HIP ARTHROPLASTY Left 12/05/2020   Procedure: TOTAL HIP ARTHROPLASTY ANTERIOR APPROACH;  Surgeon: Samson Frederic, MD;  Location: [facility] OR;  Service: Orthopedics;  Laterality: Left;   TUBAL LIGATION  ~ 1982   VAGINAL HYSTERECTOMY  2000    Family History  Problem Relation Age of Onset   Colon polyps Father    Heart disease Father    Breast cancer Sister    COPD Brother    Irritable bowel syndrome Daughter    Ovarian cancer Maternal Aunt    Stomach cancer Maternal Aunt    Diabetes Maternal Aunt    Colon cancer Maternal Uncle    Heart disease Maternal Uncle    Heart disease Other        Grandparents   Esophageal cancer Neg Hx    Pancreatic cancer Neg Hx       Social History   Socioeconomic History   Marital status: Married    Spouse name: Alexis [name]   Number of children: 2   Years of education: Not on file   Highest education level: Some college, no degree  Occupational History   Occupation: Retired    Associate Professor: RETIRED  Tobacco Use   Smoking status: Never   Smokeless tobacco: Never  Vaping Use   Vaping Use: Never used  Substance and Sexual Activity   Alcohol use: No   Drug use: No   Sexual activity: Not Currently  Other Topics Concern   Not on file  Social History Narrative   Daily Caffeine, Coke   Social Determinants of Health   Financial Resource Strain: Low Risk  (04/27/2021)   Overall Financial Resource Strain (CARDIA)    Difficulty of Paying Living Expenses: Not very hard  Food Insecurity: No Food Insecurity (11/28/2022)   Hunger Vital Sign    Worried About Running Out of Food in the Last Year: Never true    Ran Out of Food in the Last Year: Never true  Transportation Needs: No Transportation Needs (11/28/2022)   PRAPARE - Administrator, Civil Service (Medical): No    Lack of Transportation (Non-Medical): No  Physical Activity: Not on file  Stress: Not on file  Social Connections: Not on file     Allergies  Allergen Reactions   Aspirin Other (See Comments)    Reaction: Vasculitis per MD   Codeine Itching and Rash    Tolerated Norco 12/07/20 with no reports of rash/itching. Patient endorsed allergy >30 yr ago and hasn't had reaction since. Climb the walls per pt   Naproxen Nausea Only    Reaction: Makes stomach upset/irritated.   Sulfonamide Derivatives Itching and Rash     Current Outpatient Medications:    acetaminophen (TYLENOL) 325 MG tablet, Take 2 tablets (650 mg total) by mouth every 6 (six) hours as needed for mild pain or moderate pain. (Patient taking differently: Take 325 mg by mouth every 6 (six) hours as needed for mild pain, moderate pain or headache.), Disp: , Rfl:  ACIPHEX 20 MG tablet, Take 20 mg by mouth in the morning and at bedtime., Disp: , Rfl:    acyclovir (ZOVIRAX) 50 MG/ML injection, Inject 13 mLs (650 mg total) into the vein every 8 (eight) hours for 7 days. Indication:  VZV encephalitis First Dose: Yes Last Day of Therapy:  12/10/22 Labs - Once weekly:  CBC/D Labs - Twice weekly: BMP Method of administration: CADD pump - per home health protocol Pull PICC/midline after the completion of Acyclovir therapy Method of administration may be changed at the discretion of home infusion pharmacist based upon assessment of the patient and/or caregiver's ability to self-administer the medication ordered., Disp: 273 mL, Rfl: 0   ALPRAZolam (XANAX) 0.5 MG tablet, Take 0.5 mg by mouth at bedtime., Disp: , Rfl:    amLODipine (NORVASC) 5 MG tablet, Take 1 tablet (5 mg total) by mouth daily., Disp: 90 tablet, Rfl: 3   apixaban (ELIQUIS) 5 MG TABS tablet, Take 1 tablet (5 mg total) by mouth 2 (two) times daily., Disp: 180 tablet, Rfl: 1   atorvastatin (LIPITOR) 20 MG tablet, Take 1 tablet (20 mg total) by mouth daily., Disp: 90 tablet, Rfl: 3   erythromycin ophthalmic ointment, Place into the right eye every 6 (six) hours for 14 days., Disp: 3.5 g, Rfl: 0   FARXIGA 10 MG  TABS tablet, Take 10 mg by mouth in the morning., Disp: , Rfl:    ferrous sulfate 325 (65 FE) MG tablet, Take 1 tablet (325 mg total) by mouth every other day., Disp: 30 tablet, Rfl: 3   insulin lispro (HUMALOG) 100 UNIT/ML KwikPen, Inject 8 Units into the skin 3 (three) times daily., Disp: , Rfl:    metoprolol succinate (TOPROL-XL) 50 MG 24 hr tablet, Take 1 tablet (50 mg total) by mouth daily. Take with or immediately following a meal., Disp: 30 tablet, Rfl: 2   pantoprazole (PROTONIX) 40 MG tablet, Take 1 tablet (40 mg total) by mouth 2 (two) times daily., Disp: 60 tablet, Rfl: 2   pregabalin (LYRICA) 50 MG capsule, Take 50 mg by mouth 2 (two) times daily., Disp: , Rfl:    TRESIBA FLEXTOUCH 100 UNIT/ML FlexTouch Pen, Inject 18 Units into the skin at bedtime., Disp: , Rfl:    trifluridine (VIROPTIC) 1 % ophthalmic solution, Place 1 drop into the right eye every 2 (two) hours while awake for 14 days., Disp: 6.3 mL, Rfl: 0   valsartan (DIOVAN) 160 MG tablet, Take 1 tablet (160 mg total) by mouth daily., Disp: 30 tablet, Rfl: 3   venlafaxine XR (EFFEXOR-XR) 150 MG 24 hr capsule, Take 150 mg by mouth 2 (two) times daily., Disp: , Rfl:    zaleplon (SONATA) 5 MG capsule, Take 5 mg by mouth at bedtime., Disp: , Rfl:    b complex vitamins tablet, Take 1 tablet by mouth at bedtime. (Patient not taking: Reported on 12/09/2022), Disp: , Rfl:    fish oil-omega-3 fatty acids 1000 MG capsule, Take 1 g by mouth at bedtime.  (Patient not taking: Reported on 12/09/2022), Disp: , Rfl:    sodium chloride 0.9 % infusion, Inject 500 mLs into the vein 2 (two) times daily for 7 days. Give a 500 cc bolus over 2 hours at a rate of 250 cc/hr twice daily for the duration of treatment with Acyclovir thru 12/10/22, Disp: 7000 mL, Rfl: 0   Review of Systems  Constitutional:  Negative for activity change, appetite change, chills, diaphoresis, fatigue, fever and unexpected weight change.  HENT:  Negative for congestion,  rhinorrhea, sinus pressure, sneezing, sore throat and trouble swallowing.   Eyes:  Negative for photophobia and visual disturbance.  Respiratory:  Negative for cough, chest tightness, shortness of breath, wheezing and stridor.   Cardiovascular:  Negative for chest pain, palpitations and leg swelling.  Gastrointestinal:  Negative for abdominal distention, abdominal pain, anal bleeding, blood in stool, constipation, diarrhea, nausea and vomiting.  Genitourinary:  Negative for difficulty urinating, dysuria, flank pain and hematuria.  Musculoskeletal:  Negative for arthralgias, back pain, gait problem, joint swelling and myalgias.  Skin:  Negative for color change, pallor, rash and wound.  Neurological:  Negative for dizziness, tremors, weakness, light-headedness and headaches.  Hematological:  Negative for adenopathy. Does not bruise/bleed easily.  Psychiatric/Behavioral:  Negative for agitation, behavioral problems, confusion, decreased concentration, dysphoric mood, sleep disturbance and suicidal ideas.        Objective:   Physical Exam Constitutional:      General: She is not in acute distress.    Appearance: She is not diaphoretic.  HENT:     Head: Normocephalic and atraumatic.     Right Ear: External ear normal.     Left Ear: External ear normal.     Nose: Nose normal.     Mouth/Throat:     Pharynx: No oropharyngeal exudate.  Eyes:     General: No scleral icterus.       Right eye: No discharge.        Left eye: No discharge.     Extraocular Movements: Extraocular movements intact.     Conjunctiva/sclera: Conjunctivae normal.  Cardiovascular:     Rate and Rhythm: Normal rate and regular rhythm.  Pulmonary:     Effort: Pulmonary effort is normal. No respiratory distress.     Breath sounds: No wheezing or rales.  Abdominal:     General: There is no distension.     Palpations: Abdomen is soft.     Tenderness: There is no rebound.  Musculoskeletal:        General: No  tenderness. Normal range of motion.     Cervical back: Normal range of motion and neck supple.  Lymphadenopathy:     Cervical: No cervical adenopathy.  Skin:    General: Skin is warm and dry.     Coloration: Skin is not jaundiced or pale.     Findings: No erythema, lesion or rash.  Neurological:     General: No focal deficit present.     Mental Status: She is alert and oriented to person, place, and time.     Coordination: Coordination normal.  Psychiatric:        Mood and Affect: Mood normal.        Behavior: Behavior normal.        Thought Content: Thought content normal.        Judgment: Judgment normal.    PICC with some bruising   12/09/2022:          Assessment & Plan:   Zoster  ophthalmicus with encephalopathy:  I will extend her acyclovir by an additional week  I have reached out to Stephannie Li, MD who has graciously agreed to see the patient in his office  Pain: I am being told by the patient's daughter that the PCP will not provide increased lyrica or other medications because the patient is having pain because of an Infectious Disease. I do not think that is reasonable and I have never met the patient before and I do not  have widespread knowledge of her medical problems beyond what I have addressed today. I think pain control should come under PCP doman esp as Zoster pain can persist for months  I have her scheduled to see me again to make sure everything is resolved  I have personally spent 52 minutes involved in face-to-face and non-face-to-face activities for this patient on the day of the visit. Professional time spent includes the following activities: Preparing to see the patient (review of tests), Obtaining and/or reviewing separately obtained history (admission/discharge record), Performing a medically appropriate examination and/or evaluation , Ordering medications/tests/procedures, referring and communicating with other health care professionals,  Documenting clinical information in the EMR, Independently interpreting results (not separately reported), Communicating results to the patient/family/caregiver, Counseling and educating the patient/family/caregiver and Care coordination (not separately reported).

## 2022-12-09 NOTE — Telephone Encounter (Signed)
Patient's daughter called stating patient is have excruciating eye and head pain that started yesterday. She reports EMS was called, but they were advised by the paramedic that there was no need to go to the ED and to follow up with our office.  Patient will come to the office today to see Dr. Daiva Eves.  Alexis [name] T Pricilla Loveless

## 2022-12-09 NOTE — Telephone Encounter (Signed)
Per Dr. Daiva Eves extend IV abx till 5/7 and pull picc after last dose. Spoke with the Pharmacist Cassie at [facility] who read back orders and verbalized understanding.

## 2022-12-10 ENCOUNTER — Other Ambulatory Visit (HOSPITAL_COMMUNITY): Payer: Self-pay

## 2022-12-10 ENCOUNTER — Ambulatory Visit: Payer: Medicare Other | Admitting: Infectious Diseases

## 2022-12-12 ENCOUNTER — Ambulatory Visit: Payer: Medicare Other | Attending: Internal Medicine | Admitting: Internal Medicine

## 2022-12-12 ENCOUNTER — Encounter: Payer: Self-pay | Admitting: Internal Medicine

## 2022-12-12 VITALS — BP 138/60 | HR 58 | Ht 64.0 in | Wt 179.9 lb

## 2022-12-12 DIAGNOSIS — G4733 Obstructive sleep apnea (adult) (pediatric): Secondary | ICD-10-CM

## 2022-12-12 DIAGNOSIS — I480 Paroxysmal atrial fibrillation: Secondary | ICD-10-CM | POA: Diagnosis not present

## 2022-12-12 DIAGNOSIS — Z7901 Long term (current) use of anticoagulants: Secondary | ICD-10-CM | POA: Diagnosis not present

## 2022-12-12 DIAGNOSIS — I5032 Chronic diastolic (congestive) heart failure: Secondary | ICD-10-CM

## 2022-12-12 MED ORDER — FUROSEMIDE 40 MG PO TABS: 40.0000 mg | ORAL_TABLET | Freq: Every day | ORAL | 3 refills | Status: DC | PRN
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
## 2022-12-12 NOTE — Patient Instructions (Signed)
Medication Instructions:  Dr. Rennis Golden has prescribed lasix 40mg  daily -- use as needed for swelling  *If you need a refill on your cardiac medications before your next appointment, please call your pharmacy*    Follow-Up: At [facility], you and your health needs are our priority.  As part of our continuing mission to provide you with exceptional heart care, we have created designated Provider Care Teams.  These Care Teams include your primary Cardiologist (physician) and Advanced Practice Providers (APPs -  Physician Assistants and Nurse Practitioners) who all work together to provide you with the care you need, when you need it.  We recommend signing up for the patient portal called "MyChart".  Sign up information is provided on this After Visit Summary.  MyChart is used to connect with patients for Virtual Visits (Telemedicine).  Patients are able to view lab/test results, encounter notes, upcoming appointments, etc.  Non-urgent messages can be sent to your provider as well.   To learn more about what you can do with MyChart, go to [email or web address].    Your next appointment:    3 months with NP or PA

## 2022-12-13 ENCOUNTER — Telehealth: Payer: Self-pay | Admitting: Pharmacist

## 2022-12-13 NOTE — Telephone Encounter (Signed)
Cassy from [facility] called yesterday asking if patient's antibiotics would likely be extended beyond next Tuesday 5/7. I couldn't quite tell what her concern was about a possible extension on the voicemail she left, but I think it may be because she would need another request for a new HH agency. Asked for a call back from her today to clarify, but let me know if there is any potential she be may extended beyond next week.  Margarite Gouge, PharmD, CPP, BCIDP, AAHIVP Clinical Pharmacist Practitioner Infectious Diseases Clinical Pharmacist [facility]

## 2022-12-17 NOTE — Telephone Encounter (Signed)
Called patient to inquire about ophthalmology appointment, no answer. Left HIPAA compliant voicemail requesting callback.   Spoke with patient's daughter Carollee Herter [facility]), she states that Idalys finished antibiotics yesterday and did not receive a dose today.   Agnes's appointment with ophthalmology was today and reports that the shingles is not affecting her vision.   Sandie Ano, RN

## 2022-12-17 NOTE — Telephone Encounter (Signed)
Received voicemail from patient's daughter stating that patient has finished IV antibiotics and would like to know if PICC can be pulled.   Sandie Ano, RN

## 2022-12-17 NOTE — Telephone Encounter (Signed)
Pull PICC orders sent to Jeri Modena, RN with [facility] per Dr. Daiva Eves.   Sandie Ano, RN

## 2022-12-18 LAB — HM DIABETES EYE EXAM

## 2023-01-08 ENCOUNTER — Other Ambulatory Visit: Payer: Self-pay

## 2023-01-08 ENCOUNTER — Ambulatory Visit (INDEPENDENT_AMBULATORY_CARE_PROVIDER_SITE_OTHER): Payer: Medicare Other | Admitting: Infectious Disease

## 2023-01-08 ENCOUNTER — Encounter: Payer: Self-pay | Admitting: Infectious Disease

## 2023-01-08 VITALS — BP 153/79 | HR 57 | Temp 98.0°F | Wt 169.0 lb

## 2023-01-08 DIAGNOSIS — Z794 Long term (current) use of insulin: Secondary | ICD-10-CM

## 2023-01-08 DIAGNOSIS — B0230 Zoster ocular disease, unspecified: Secondary | ICD-10-CM | POA: Diagnosis not present

## 2023-01-08 DIAGNOSIS — B0229 Other postherpetic nervous system involvement: Secondary | ICD-10-CM | POA: Diagnosis not present

## 2023-01-08 DIAGNOSIS — E1169 Type 2 diabetes mellitus with other specified complication: Secondary | ICD-10-CM | POA: Diagnosis not present

## 2023-01-08 NOTE — Progress Notes (Signed)
Subjective:  Chief complaint:still with pain in her eyes and scalp due to her VZV infection   Patient ID: Alexis [name], female    DOB: April 12, 1951, 72 y.o.   MRN: [id number]  HPI  Alexis [name] is a 72 year old Caucasian woman with xh of DM, NASH who was admitted to [facility] with zoster ophthalmicus infection with extension of the CNS with encephalopathy and VZV isolated on CSF.  On-call ophthalmologist was contacted regarding her care and had recommendation with regards to various eyedrops and that the patient be seen by ophthalmology as an outpatient.  She was seen by my partner [name] and set up with acyclovir to complete 2 weeks of IV therapy.  Does not appear that outpatient ophthalmology was ever arranged for the patient.  She was scheduled to see [name] tomorrow but had such severe eye pain last night and agitation that they called 911 and EMS came and assessed the patient.  She ultimately rescheduled to see me today on urgent basis.  Her daughter had made an appointment with optometry which is not sufficient obviously to exclude deep infection in the eye.  Vision had improved already when I saw her at the last visit but due to anxiety about potential ocular infection we continued her acyclovir extended it and had her see Dr. Allyne Gee who excluded retinal involvement due to VZV.  She is continuing improved but still has pain behind her eyes and on her scalp and a sensation of something dripping on her scalp.  This is undoubtedly due to nerve damage from a zoster virus.     Past Medical History:  Diagnosis Date   Anxiety    Arthritis    "knees" (05/25/2014)   Chronic back pain    Depression    Diabetes mellitus type II    Diabetic peripheral neuropathy (HCC) 10/20/2018   Diverticulitis    Fatty liver    Fibromyalgia    Gastric polyp    hyperplastic   Gastroparesis    "recently dx'd" (05/25/2014)   GERD (gastroesophageal reflux disease)    H/O  hiatal hernia    Hx of gastritis    Hyperlipidemia    Hypertension    Irritable bowel syndrome (IBS)    Memory difficulties 09/18/2017   Migraine without aura, without mention of intractable migraine without mention of status migrainosus    "related to allergies; have them in the spring and fall" (05/25/2014)   Obesity    Sleep apnea    "wore mask; took it off in my sleep; quit wearing it" (05/25/2014)   Uterine cancer (HCC) dx'd 2000   surg only   Vasculitis (HCC)    "irritates my legs"   Wears glasses    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    Past Surgical History:  Procedure Laterality Date   ANTERIOR CERVICAL DECOMPRESSION/DISCECTOMY FUSION 4 LEVELS N/A 05/07/2018   Procedure: ANTERIOR CERVICAL DECOMPRESSION FUSION, CERVICAL 4-5,CERVICAL 5-6, CERVICAL 6-7 WITH INSTRUMENTATION AND ALLOGRAFT;  Surgeon: Estill Bamberg, MD;  Location: [facility] OR;  Service: Orthopedics;  Laterality: N/A;   APPENDECTOMY  ~ 1967   BIOPSY  07/27/2021   Procedure: BIOPSY;  Surgeon: Benancio Deeds, MD;  Location: [facility] ENDOSCOPY;  Service: Gastroenterology;;   BREAST CYST EXCISION Right 1990   COLONOSCOPY     COLONOSCOPY WITH PROPOFOL N/A 07/27/2021   Procedure: COLONOSCOPY WITH PROPOFOL;  Surgeon: Benancio Deeds, MD;  Location: [facility] ENDOSCOPY;  Service: Gastroenterology;  Laterality: N/A;   ESOPHAGOGASTRODUODENOSCOPY (EGD) WITH PROPOFOL  N/A 07/27/2021   Procedure: ESOPHAGOGASTRODUODENOSCOPY (EGD) WITH PROPOFOL;  Surgeon: Benancio Deeds, MD;  Location: [facility] ENDOSCOPY;  Service: Gastroenterology;  Laterality: N/A;   HEMOSTASIS CLIP PLACEMENT  07/27/2021   Procedure: HEMOSTASIS CLIP PLACEMENT;  Surgeon: Benancio Deeds, MD;  Location: [facility] ENDOSCOPY;  Service: Gastroenterology;;   LAPAROSCOPIC CHOLECYSTECTOMY  1990   LEFT HEART CATHETERIZATION WITH CORONARY ANGIOGRAM N/A 05/26/2014   Procedure: LEFT HEART CATHETERIZATION WITH CORONARY ANGIOGRAM;  Surgeon: Lennette Bihari, MD;  Location: [facility] CATH LAB;   Service: Cardiovascular;  Laterality: N/A;   POLYPECTOMY  07/27/2021   Procedure: POLYPECTOMY;  Surgeon: Benancio Deeds, MD;  Location: [facility] ENDOSCOPY;  Service: Gastroenterology;;   [name]  07/27/2021   Procedure: [name];  Surgeon: Benancio Deeds, MD;  Location: [facility] ENDOSCOPY;  Service: Gastroenterology;;   SINUS SURGERY WITH INSTATRAK  2000   TOTAL HIP ARTHROPLASTY Left 12/05/2020   Procedure: TOTAL HIP ARTHROPLASTY ANTERIOR APPROACH;  Surgeon: Samson Frederic, MD;  Location: [facility] OR;  Service: Orthopedics;  Laterality: Left;   TUBAL LIGATION  ~ 1982   VAGINAL HYSTERECTOMY  2000    Family History  Problem Relation Age of Onset   Colon polyps Father    Heart disease Father    Breast cancer Sister    COPD Brother    Irritable bowel syndrome Daughter    Ovarian cancer Maternal Aunt    Stomach cancer Maternal Aunt    Diabetes Maternal Aunt    Colon cancer Maternal Uncle    Heart disease Maternal Uncle    Heart disease Other        Grandparents   Esophageal cancer Neg Hx    Pancreatic cancer Neg Hx       Social History   Socioeconomic History   Marital status: Married    Spouse name: Ija Lewy   Number of children: 2   Years of education: Not on file   Highest education level: Some college, no degree  Occupational History   Occupation: Retired    Associate Professor: RETIRED  Tobacco Use   Smoking status: Never   Smokeless tobacco: Never  Vaping Use   Vaping Use: Never used  Substance and Sexual Activity   Alcohol use: No   Drug use: No   Sexual activity: Not Currently  Other Topics Concern   Not on file  Social History Narrative   Daily Caffeine, Coke   Social Determinants of Health   Financial Resource Strain: Low Risk  (04/27/2021)   Overall Financial Resource Strain (CARDIA)    Difficulty of Paying Living Expenses: Not very hard  Food Insecurity: No Food Insecurity (11/28/2022)   Hunger Vital Sign    Worried About Running Out of Food in the  Last Year: Never true    Ran Out of Food in the Last Year: Never true  Transportation Needs: No Transportation Needs (11/28/2022)   PRAPARE - Administrator, Civil Service (Medical): No    Lack of Transportation (Non-Medical): No  Physical Activity: Not on file  Stress: Not on file  Social Connections: Not on file    Allergies  Allergen Reactions   Aspirin Other (See Comments)    Reaction: Vasculitis per MD   Codeine Itching and Rash    Tolerated Norco 12/07/20 with no reports of rash/itching. Patient endorsed allergy >30 yr ago and hasn't had reaction since. Climb the walls per pt   Naproxen Nausea Only    Reaction: Makes stomach upset/irritated.   Sulfonamide Derivatives Itching and  Rash     Current Outpatient Medications:    acetaminophen (TYLENOL) 325 MG tablet, Take 2 tablets (650 mg total) by mouth every 6 (six) hours as needed for mild pain or moderate pain. (Patient taking differently: Take 325 mg by mouth every 6 (six) hours as needed for mild pain, moderate pain or headache.), Disp: , Rfl:    ACIPHEX 20 MG tablet, Take 20 mg by mouth in the morning and at bedtime., Disp: , Rfl:    ALPRAZolam (XANAX) 0.5 MG tablet, Take 0.5 mg by mouth at bedtime., Disp: , Rfl:    amLODipine (NORVASC) 5 MG tablet, Take 1 tablet (5 mg total) by mouth daily., Disp: 90 tablet, Rfl: 3   apixaban (ELIQUIS) 5 MG TABS tablet, Take 1 tablet (5 mg total) by mouth 2 (two) times daily., Disp: 180 tablet, Rfl: 1   atorvastatin (LIPITOR) 20 MG tablet, Take 1 tablet (20 mg total) by mouth daily., Disp: 90 tablet, Rfl: 3   b complex vitamins tablet, Take 1 tablet by mouth at bedtime., Disp: , Rfl:    FARXIGA 10 MG TABS tablet, Take 10 mg by mouth in the morning., Disp: , Rfl:    ferrous sulfate 325 (65 FE) MG tablet, Take 1 tablet (325 mg total) by mouth every other day., Disp: 30 tablet, Rfl: 3   fish oil-omega-3 fatty acids 1000 MG capsule, Take 1 g by mouth at bedtime., Disp: , Rfl:     furosemide (LASIX) 40 MG tablet, Take 1 tablet (40 mg total) by mouth daily as needed., Disp: 30 tablet, Rfl: 3   insulin lispro (HUMALOG) 100 UNIT/ML KwikPen, Inject 8 Units into the skin 3 (three) times daily., Disp: , Rfl:    metoprolol succinate (TOPROL-XL) 50 MG 24 hr tablet, Take 1 tablet (50 mg total) by mouth daily. Take with or immediately following a meal., Disp: 30 tablet, Rfl: 2   oxyCODONE-acetaminophen (PERCOCET) 10-325 MG tablet, 1 tablet as needed Orally every 6 hrs for 5 days, Disp: , Rfl:    pantoprazole (PROTONIX) 40 MG tablet, Take 1 tablet (40 mg total) by mouth 2 (two) times daily., Disp: 60 tablet, Rfl: 2   pregabalin (LYRICA) 50 MG capsule, Take 50 mg by mouth 2 (two) times daily. (Patient not taking: Reported on 12/12/2022), Disp: , Rfl:    pregabalin (LYRICA) 75 MG capsule, Take 75 mg by mouth 2 (two) times daily., Disp: , Rfl:    sodium chloride 0.9 % infusion, Inject into the vein., Disp: , Rfl:    TRESIBA FLEXTOUCH 100 UNIT/ML FlexTouch Pen, Inject 18 Units into the skin at bedtime., Disp: , Rfl:    valsartan (DIOVAN) 160 MG tablet, Take 1 tablet (160 mg total) by mouth daily., Disp: 30 tablet, Rfl: 3   venlafaxine XR (EFFEXOR-XR) 150 MG 24 hr capsule, Take 150 mg by mouth 2 (two) times daily., Disp: , Rfl:    zaleplon (SONATA) 5 MG capsule, Take 5 mg by mouth at bedtime., Disp: , Rfl:    Review of Systems  Constitutional:  Negative for activity change, appetite change, chills, diaphoresis, fatigue, fever and unexpected weight change.  HENT:  Negative for congestion, dental problem, drooling, rhinorrhea, sinus pressure, sneezing, sore throat and trouble swallowing.   Eyes:  Negative for photophobia and visual disturbance.  Respiratory:  Negative for cough, chest tightness, shortness of breath, wheezing and stridor.   Cardiovascular:  Negative for chest pain, palpitations and leg swelling.  Gastrointestinal:  Negative for abdominal distention, abdominal pain, anal  bleeding, blood in stool, constipation, diarrhea, nausea and vomiting.  Genitourinary:  Negative for difficulty urinating, dysuria, flank pain and hematuria.  Musculoskeletal:  Negative for arthralgias, back pain, gait problem, joint swelling and myalgias.  Skin:  Negative for color change, pallor, rash and wound.  Neurological:  Positive for headaches. Negative for dizziness, tremors, weakness and light-headedness.  Hematological:  Negative for adenopathy. Does not bruise/bleed easily.  Psychiatric/Behavioral:  Negative for agitation, behavioral problems, confusion, decreased concentration, dysphoric mood, sleep disturbance and suicidal ideas.        Objective:   Physical Exam Constitutional:      General: She is not in acute distress.    Appearance: Normal appearance. She is well-developed. She is not ill-appearing or diaphoretic.  HENT:     Head: Normocephalic and atraumatic.     Right Ear: Hearing and external ear normal.     Left Ear: Hearing and external ear normal.     Nose: No nasal deformity or rhinorrhea.  Eyes:     General: No scleral icterus.    Conjunctiva/sclera: Conjunctivae normal.     Right eye: Right conjunctiva is not injected.     Left eye: Left conjunctiva is not injected.     Pupils: Pupils are equal, round, and reactive to light.  Neck:     Vascular: No JVD.  Cardiovascular:     Rate and Rhythm: Normal rate and regular rhythm.     Heart sounds: S1 normal and S2 normal.  Abdominal:     General: Bowel sounds are normal. There is no distension.     Palpations: Abdomen is soft.     Tenderness: There is no abdominal tenderness.  Musculoskeletal:        General: Normal range of motion.     Right shoulder: Normal.     Left shoulder: Normal.     Cervical back: Normal range of motion and neck supple.     Right hip: Normal.     Left hip: Normal.     Right knee: Normal.     Left knee: Normal.  Lymphadenopathy:     Head:     Right side of head: No  submandibular, preauricular or posterior auricular adenopathy.     Left side of head: No submandibular, preauricular or posterior auricular adenopathy.     Cervical: No cervical adenopathy.     Right cervical: No superficial or deep cervical adenopathy.    Left cervical: No superficial or deep cervical adenopathy.  Skin:    General: Skin is warm and dry.     Coloration: Skin is not pale.     Findings: No abrasion, bruising, ecchymosis, erythema, lesion or rash.     Nails: There is no clubbing.  Neurological:     General: No focal deficit present.     Mental Status: She is alert and oriented to person, place, and time.     Sensory: No sensory deficit.     Coordination: Coordination normal.     Gait: Gait normal.  Psychiatric:        Attention and Perception: She is attentive.        Mood and Affect: Mood normal.        Speech: Speech normal.        Behavior: Behavior normal. Behavior is cooperative.        Thought Content: Thought content normal.        Judgment: Judgment normal.           Assessment &  Plan:   Zoster ophthalmicus with encephalopathy:  Her viral infection has resolved but she still has post herpetic neuralgia.  Reviewed notes from Dr. Allyne Gee with regards to her ocular exam  Postherpetic neuralgia: She is continue on Lyrica and also with strategies of applying warm compresses while drinking cold water and avoiding light to deal with her episodes of pain.

## 2023-02-03 ENCOUNTER — Telehealth: Payer: Self-pay | Admitting: *Deleted

## 2023-02-03 NOTE — Telephone Encounter (Signed)
-----   Message from Missy Sabins, RN sent at 02/03/2023  8:20 AM EDT ----- Regarding: FW: 35-month labs  ----- Message ----- From: Missy Sabins, RN Sent: 02/03/2023  12:00 AM EDT To: Missy Sabins, RN Subject: 58-month labs                                   IBC + Ferritin - orders are in [employer]

## 2023-02-03 NOTE — Telephone Encounter (Signed)
-----   Message from Brooklyn N Powell, RN sent at 02/03/2023  8:20 AM EDT ----- Regarding: FW: 3-month labs  ----- Message ----- From: Powell, Brooklyn N, RN Sent: 02/03/2023  12:00 AM EDT To: Brooklyn N Powell, RN Subject: 3-month labs                                   IBC + Ferritin - orders are in [employer]   

## 2023-02-03 NOTE — Telephone Encounter (Signed)
Patient called and left vm to come to the lab located in the basement at the [facility] GI office to obtain labs. Hours given are 7:30-5pm, M-F, no appt needed.

## 2023-02-07 ENCOUNTER — Other Ambulatory Visit (INDEPENDENT_AMBULATORY_CARE_PROVIDER_SITE_OTHER): Payer: Medicare Other

## 2023-02-07 DIAGNOSIS — D649 Anemia, unspecified: Secondary | ICD-10-CM

## 2023-02-07 DIAGNOSIS — R7989 Other specified abnormal findings of blood chemistry: Secondary | ICD-10-CM | POA: Diagnosis not present

## 2023-02-07 LAB — IBC + FERRITIN
Ferritin: 83.1 ng/mL (ref 10.0–291.0)
Iron: 95 ug/dL (ref 42–145)
Saturation Ratios: 31.4 % (ref 20.0–50.0)
TIBC: 302.4 ug/dL (ref 250.0–450.0)
Transferrin: 216 mg/dL (ref 212.0–360.0)

## 2023-02-07 LAB — HEPATIC FUNCTION PANEL
ALT: 27 U/L (ref 0–35)
AST: 35 U/L (ref 0–37)
Albumin: 3.1 g/dL — ABNORMAL LOW (ref 3.5–5.2)
Alkaline Phosphatase: 133 U/L — ABNORMAL HIGH (ref 39–117)
Bilirubin, Direct: 0.1 mg/dL (ref 0.0–0.3)
Total Bilirubin: 0.4 mg/dL (ref 0.2–1.2)
Total Protein: 6.5 g/dL (ref 6.0–8.3)

## 2023-03-13 NOTE — Progress Notes (Signed)
Cardiology Clinic Note   Patient Name: Alexis [name] Date of Encounter: 03/14/2023  Primary Care Provider:  Fatima Sanger, FNP Primary Cardiologist:  Chrystie Nose, MD  Patient Profile    72 year old female with history of chronic diastolic heart failure, with normal LVEF of 65 to 70%, nonobstructive coronary artery disease by coronary CT angiogram 11/21/2020, small PFO with evidence of shunting, postoperative atrial fibrillation on amiodarone and Eliquis, insulin-dependent, hypertension, dyslipidemia, OSA not compliant with CPAP, vasculitis and palpitations.  Last seen by Dr. Rennis Golden on 12/12/2022.  He noted that she had been recently hospitalized for herpes zoster ophthalmicus  and encephalopathy.  She had been taken off of diuretics due to renal insufficiency at that time.  Dr. Rennis Golden provided her with 40 mg of Lasix to use as needed.  She did not appear to be compensated at the time of his evaluation and assessment.  She was to remain on Eliquis due to PAF.  She was to return in 3 months.   Past Medical History    Past Medical History:  Diagnosis Date   Anxiety    Arthritis    "knees" (05/25/2014)   Chronic back pain    Depression    Diabetes mellitus type II    Diabetic peripheral neuropathy (HCC) 10/20/2018   Diverticulitis    Fatty liver    Fibromyalgia    Gastric polyp    hyperplastic   Gastroparesis    "recently dx'd" (05/25/2014)   GERD (gastroesophageal reflux disease)    H/O hiatal hernia    Hx of gastritis    Hyperlipidemia    Hypertension    Irritable bowel syndrome (IBS)    Memory difficulties 09/18/2017   Migraine without aura, without mention of intractable migraine without mention of status migrainosus    "related to allergies; have them in the spring and fall" (05/25/2014)   Obesity    Sleep apnea    "wore mask; took it off in my sleep; quit wearing it" (05/25/2014)   Uterine cancer (HCC) dx'd 2000   surg only   Vasculitis (HCC)    "irritates  my legs"   Wears glasses    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   Past Surgical History:  Procedure Laterality Date   ANTERIOR CERVICAL DECOMPRESSION/DISCECTOMY FUSION 4 LEVELS N/A 05/07/2018   Procedure: ANTERIOR CERVICAL DECOMPRESSION FUSION, CERVICAL 4-5,CERVICAL 5-6, CERVICAL 6-7 WITH INSTRUMENTATION AND ALLOGRAFT;  Surgeon: Estill Bamberg, MD;  Location: [facility] OR;  Service: Orthopedics;  Laterality: N/A;   APPENDECTOMY  ~ 1967   BIOPSY  07/27/2021   Procedure: BIOPSY;  Surgeon: Benancio Deeds, MD;  Location: [facility] ENDOSCOPY;  Service: Gastroenterology;;   BREAST CYST EXCISION Right 1990   COLONOSCOPY     COLONOSCOPY WITH PROPOFOL N/A 07/27/2021   Procedure: COLONOSCOPY WITH PROPOFOL;  Surgeon: Benancio Deeds, MD;  Location: [facility] ENDOSCOPY;  Service: Gastroenterology;  Laterality: N/A;   ESOPHAGOGASTRODUODENOSCOPY (EGD) WITH PROPOFOL N/A 07/27/2021   Procedure: ESOPHAGOGASTRODUODENOSCOPY (EGD) WITH PROPOFOL;  Surgeon: Benancio Deeds, MD;  Location: [facility] ENDOSCOPY;  Service: Gastroenterology;  Laterality: N/A;   HEMOSTASIS CLIP PLACEMENT  07/27/2021   Procedure: HEMOSTASIS CLIP PLACEMENT;  Surgeon: Benancio Deeds, MD;  Location: [facility] ENDOSCOPY;  Service: Gastroenterology;;   LAPAROSCOPIC CHOLECYSTECTOMY  1990   LEFT HEART CATHETERIZATION WITH CORONARY ANGIOGRAM N/A 05/26/2014   Procedure: LEFT HEART CATHETERIZATION WITH CORONARY ANGIOGRAM;  Surgeon: Lennette Bihari, MD;  Location: [facility] CATH LAB;  Service: Cardiovascular;  Laterality: N/A;   POLYPECTOMY  07/27/2021  Procedure: POLYPECTOMY;  Surgeon: Benancio Deeds, MD;  Location: [facility] ENDOSCOPY;  Service: Gastroenterology;;   [name]  07/27/2021   Procedure: [name];  Surgeon: Benancio Deeds, MD;  Location: [facility] ENDOSCOPY;  Service: Gastroenterology;;   SINUS SURGERY WITH INSTATRAK  2000   TOTAL HIP ARTHROPLASTY Left 12/05/2020   Procedure: TOTAL HIP ARTHROPLASTY ANTERIOR APPROACH;  Surgeon: Samson Frederic, MD;   Location: [facility] OR;  Service: Orthopedics;  Laterality: Left;   TUBAL LIGATION  ~ 1982   VAGINAL HYSTERECTOMY  2000    Allergies  Allergies  Allergen Reactions   Aspirin Other (See Comments)    Reaction: Vasculitis per MD   Codeine Itching and Rash    Tolerated Norco 12/07/20 with no reports of rash/itching. Patient endorsed allergy >30 yr ago and hasn't had reaction since. Climb the walls per pt   Naproxen Nausea Only    Reaction: Makes stomach upset/irritated.   Sulfonamide Derivatives Itching and Rash    History of Present Illness    Alexis [name] comes today for ongoing assessment and management of atrial fibrillation and hypertension.  She is without any cardiac complaints today.  She speaks at length about her bout of shingles and its infection into her right eye, and scalp.  She has been medically compliant concerning antihypertensives and anticoagulation medications.  She denies any bleeding, hemoptysis, or melena.  Her main issues are related to recovery from shingles infection and has ongoing sequela from that.  She continues to follow with ophthalmology for eye exams.  Home Medications    Current Outpatient Medications  Medication Sig Dispense Refill   acetaminophen (TYLENOL) 325 MG tablet Take 2 tablets (650 mg total) by mouth every 6 (six) hours as needed for mild pain or moderate pain. (Patient taking differently: Take 325 mg by mouth every 6 (six) hours as needed for mild pain, moderate pain or headache.)     ACIPHEX 20 MG tablet Take 20 mg by mouth in the morning and at bedtime.     ALPRAZolam (XANAX) 0.5 MG tablet Take 0.5 mg by mouth at bedtime.     amLODipine (NORVASC) 5 MG tablet Take 1 tablet (5 mg total) by mouth daily. 90 tablet 3   apixaban (ELIQUIS) 5 MG TABS tablet Take 1 tablet (5 mg total) by mouth 2 (two) times daily. 180 tablet 1   atorvastatin (LIPITOR) 20 MG tablet Take 1 tablet (20 mg total) by mouth daily. 90 tablet 3   b complex vitamins tablet Take 1 tablet  by mouth at bedtime.     Calcium Carb-Cholecalciferol (CALCIUM 1000 + D PO) Take by mouth. Pt says takes half of tab     FARXIGA 10 MG TABS tablet Take 10 mg by mouth in the morning.     ferrous sulfate 325 (65 FE) MG tablet Take 1 tablet (325 mg total) by mouth every other day. 30 tablet 3   fish oil-omega-3 fatty acids 1000 MG capsule Take 1 g by mouth at bedtime.     insulin lispro (HUMALOG) 100 UNIT/ML KwikPen Inject 8 Units into the skin 3 (three) times daily.     lidocaine (LIDODERM) 5 % Place 1 patch onto the skin daily.     pantoprazole (PROTONIX) 40 MG tablet Take 1 tablet (40 mg total) by mouth 2 (two) times daily. 60 tablet 2   pregabalin (LYRICA) 50 MG capsule Take 50 mg by mouth 2 (two) times daily.     pregabalin (LYRICA) 75 MG capsule Take 75 mg by mouth  2 (two) times daily.     TRESIBA FLEXTOUCH 100 UNIT/ML FlexTouch Pen Inject 18 Units into the skin at bedtime.     valsartan (DIOVAN) 160 MG tablet Take 1 tablet (160 mg total) by mouth daily. 30 tablet 3   venlafaxine XR (EFFEXOR-XR) 150 MG 24 hr capsule Take 150 mg by mouth 2 (two) times daily.     zaleplon (SONATA) 5 MG capsule Take 5 mg by mouth at bedtime.     furosemide (LASIX) 40 MG tablet Take 1 tablet (40 mg total) by mouth daily as needed. 30 tablet 3   metoprolol succinate (TOPROL-XL) 50 MG 24 hr tablet Take 1 tablet (50 mg total) by mouth daily. Take with or immediately following a meal. 30 tablet 2   oxyCODONE-acetaminophen (PERCOCET) 10-325 MG tablet 1 tablet as needed Orally every 6 hrs for 5 days (Patient not taking: Reported on 03/14/2023)     sodium chloride 0.9 % infusion Inject into the vein. (Patient not taking: Reported on 03/14/2023)     No current facility-administered medications for this visit.     Family History    Family History  Problem Relation Age of Onset   Colon polyps Father    Heart disease Father    Breast cancer Sister    COPD Brother    Irritable bowel syndrome Daughter    Ovarian cancer  Maternal Aunt    Stomach cancer Maternal Aunt    Diabetes Maternal Aunt    Colon cancer Maternal Uncle    Heart disease Maternal Uncle    Heart disease Other        Grandparents   Esophageal cancer Neg Hx    Pancreatic cancer Neg Hx    She indicated that her mother is alive. She indicated that her father is alive. She indicated that her sister is alive. She indicated that the status of her brother is unknown. She indicated that her maternal grandmother is deceased. She indicated that her maternal grandfather is deceased. She indicated that her paternal grandmother is deceased. She indicated that her paternal grandfather is deceased. She indicated that two of her three daughters are alive. She indicated that the status of her neg hx is unknown. She indicated that the status of her other is unknown.  Social History    Social History   Socioeconomic History   Marital status: Married    Spouse name: Ricci Paff   Number of children: 2   Years of education: Not on file   Highest education level: Some college, no degree  Occupational History   Occupation: Retired    Associate Professor: RETIRED  Tobacco Use   Smoking status: Never   Smokeless tobacco: Never  Vaping Use   Vaping status: Never Used  Substance and Sexual Activity   Alcohol use: No   Drug use: No   Sexual activity: Not Currently  Other Topics Concern   Not on file  Social History Narrative   Daily Caffeine, Coke   Social Determinants of Health   Financial Resource Strain: Low Risk  (04/27/2021)   Overall Financial Resource Strain (CARDIA)    Difficulty of Paying Living Expenses: Not very hard  Food Insecurity: No Food Insecurity (11/28/2022)   Hunger Vital Sign    Worried About Running Out of Food in the Last Year: Never true    Ran Out of Food in the Last Year: Never true  Transportation Needs: No Transportation Needs (11/28/2022)   PRAPARE - Transportation    Lack of Transportation (  Medical): No    Lack of  Transportation (Non-Medical): No  Physical Activity: Not on file  Stress: Not on file  Social Connections: Not on file  Intimate Partner Violence: Not At Risk (11/28/2022)   Humiliation, Afraid, Rape, and Kick questionnaire    Fear of Current or Ex-Partner: No    Emotionally Abused: No    Physically Abused: No    Sexually Abused: No     Review of Systems    General:  No chills, fever, night sweats or weight changes.  Continues neuralgia pain and vision disturbances from the right eye. Cardiovascular:  No chest pain, dyspnea on exertion, edema, orthopnea, palpitations, paroxysmal nocturnal dyspnea. Dermatological: No rash, lesions/masses.  Respiratory: No cough, dyspnea Urologic: No hematuria, dysuria Abdominal:   No nausea, vomiting, diarrhea, bright red blood per rectum, melena, or hematemesis Neurologic:  No visual changes, wkns, changes in mental status. All other systems reviewed and are otherwise negative except as noted above.       Physical Exam    VS:  BP 136/82 (BP Location: Left Arm, Patient Position: Sitting, Cuff Size: Normal)   Pulse 75   Ht 5\' 4"  (1.626 m)   Wt 177 lb 12.8 oz (80.6 kg)   SpO2 92%   BMI 30.52 kg/m  , BMI Body mass index is 30.52 kg/m.     GEN: Well nourished, well developed, in no acute distress. HEENT: Right eye induration.  Bruising along the right scalp and forehead. Neck: Supple, no JVD, carotid bruits, or masses. Cardiac: RRR, 2/6 systolic murmurs, rubs, or gallops. No clubbing, cyanosis, edema.  Radials/DP/PT 2+ and equal bilaterally.  Respiratory:  Respirations regular and unlabored, clear to auscultation bilaterally. GI: Soft, nontender, nondistended, BS + x 4. MS: no deformity or atrophy. Skin: warm and dry, no rash. Neuro:  Strength and sensation are intact.   Psych: Normal affect.  Anxiety related to recent infection.      Lab Results  Component Value Date   WBC 12.1 (H) 11/29/2022   HGB 15.2 (H) 11/29/2022   HCT 46.2 (H)  11/29/2022   MCV 86.0 11/29/2022   PLT 184 11/29/2022   Lab Results  Component Value Date   CREATININE 0.77 12/03/2022   BUN 16 12/03/2022   NA 136 12/03/2022   K 3.3 (L) 12/03/2022   CL 98 12/03/2022   CO2 29 12/03/2022   Lab Results  Component Value Date   ALT 27 02/07/2023   AST 35 02/07/2023   ALKPHOS 133 (H) 02/07/2023   BILITOT 0.4 02/07/2023   Lab Results  Component Value Date   CHOL 162 11/13/2022   HDL 66 11/13/2022   LDLCALC 74 11/13/2022   TRIG 130 11/13/2022   CHOLHDL 2.5 11/13/2022    Lab Results  Component Value Date   HGBA1C 9.8 (H) 11/30/2022     Review of Prior Studies Echocardiogram 07/25/2021     1. Left ventricular ejection fraction, by estimation, is 65 to 70%. The  left ventricle has normal function. The left ventricle has no regional  wall motion abnormalities. Left ventricular diastolic parameters are  consistent with Grade II diastolic  dysfunction (pseudonormalization).   2. Right ventricular systolic function is normal. The right ventricular  size is normal.   3. The mitral valve is degenerative. No evidence of mitral valve  regurgitation. No evidence of mitral stenosis. Moderate mitral annular  calcification.   4. The aortic valve is tricuspid. Aortic valve regurgitation is not  visualized. Aortic valve sclerosis is  present, with no evidence of aortic  valve stenosis.   5. The inferior vena cava is normal in size with <50% respiratory  variability, suggesting right atrial pressure of 8 mmHg.    Assessment & Plan   1.  Paroxysmal atrial fibrillation: She remains in normal sinus rhythm today.  Heart rate is well-controlled, no issues with anticoagulation.  Continue current regimen with metoprolol and Eliquis no changes.  2.  Hypertension: Excellent control of blood pressure today.  She should continue amlodipine and valsartan as directed.  3.  Postherpetic neuralgia: Continue to follow with infectious diseases and primary care for  sequela.       Signed, Bettey Mare. Liborio Nixon, ANP, AACC   03/14/2023 4:40 PM      Office [phone] Fax [phone]  Notice: This dictation was prepared with Dragon dictation along with smaller phrase technology. Any transcriptional errors that result from this process are unintentional and may not be corrected upon review.

## 2023-03-14 ENCOUNTER — Ambulatory Visit: Payer: Medicare Other | Attending: Adult Health | Admitting: Adult Health

## 2023-03-14 ENCOUNTER — Encounter: Payer: Self-pay | Admitting: Adult Health

## 2023-03-14 VITALS — BP 136/82 | HR 75 | Ht 64.0 in | Wt 177.8 lb

## 2023-03-14 DIAGNOSIS — I480 Paroxysmal atrial fibrillation: Secondary | ICD-10-CM

## 2023-03-14 DIAGNOSIS — B0239 Other herpes zoster eye disease: Secondary | ICD-10-CM | POA: Diagnosis not present

## 2023-03-14 DIAGNOSIS — I10 Essential (primary) hypertension: Secondary | ICD-10-CM

## 2023-03-14 NOTE — Patient Instructions (Signed)
Medication Instructions:  No Changes *If you need a refill on your cardiac medications before your next appointment, please call your pharmacy*   Lab Work: No Labs If you have labs (blood work) drawn today and your tests are completely normal, you will receive your results only by: [place] (if you have MyChart) OR A paper copy in the mail If you have any lab test that is abnormal or we need to change your treatment, we will call you to review the results.   Testing/Procedures: No Testing   Follow-Up: At [facility], you and your health needs are our priority.  As part of our continuing mission to provide you with exceptional heart care, we have created designated Provider Care Teams.  These Care Teams include your primary Cardiologist (physician) and Advanced Practice Providers (APPs -  Physician Assistants and Nurse Practitioners) who all work together to provide you with the care you need, when you need it.  We recommend signing up for the patient portal called "MyChart".  Sign up information is provided on this After Visit Summary.  MyChart is used to connect with patients for Virtual Visits (Telemedicine).  Patients are able to view lab/test results, encounter notes, upcoming appointments, etc.  Non-urgent messages can be sent to your provider as well.   To learn more about what you can do with MyChart, go to [email or web address].    Your next appointment:   6 month(s)  Provider:   Pixie Casino, MD

## 2023-03-16 ENCOUNTER — Other Ambulatory Visit: Payer: Self-pay | Admitting: Internal Medicine

## 2023-03-20 ENCOUNTER — Other Ambulatory Visit: Payer: Self-pay | Admitting: Internal Medicine

## 2023-03-26 LAB — LAB REPORT - SCANNED
A1c: 11.5
EGFR: 70

## 2023-07-18 ENCOUNTER — Other Ambulatory Visit: Payer: Self-pay | Admitting: Internal Medicine

## 2023-07-18 NOTE — Telephone Encounter (Signed)
Prescription refill request for Eliquis received. Indication:afib Last office visit:8/24 Scr:0.81  8/24 Age: 72 Weight:80.6  kg  Prescription refilled

## 2023-07-21 ENCOUNTER — Telehealth: Payer: Self-pay | Admitting: Neurology

## 2023-07-21 NOTE — Telephone Encounter (Signed)
Lmtrc 1st attempt. Pt needs urgent care or pcp.

## 2023-07-21 NOTE — Telephone Encounter (Signed)
Pt calling to schedule appt for side effects from shinlges on her eye. Advised we will need a referral.

## 2023-08-09 ENCOUNTER — Other Ambulatory Visit (HOSPITAL_BASED_OUTPATIENT_CLINIC_OR_DEPARTMENT_OTHER): Payer: Self-pay | Admitting: Internal Medicine

## 2023-09-04 ENCOUNTER — Other Ambulatory Visit: Payer: Self-pay | Admitting: Internal Medicine

## 2023-09-19 ENCOUNTER — Other Ambulatory Visit: Payer: Self-pay | Admitting: Internal Medicine

## 2023-09-30 LAB — LAB REPORT - SCANNED
A1c: 9.2
Albumin, Urine POC: 1308
Creatinine, POC: 91.4 mg/dL
EGFR: 49
Microalb Creat Ratio: 1431

## 2023-10-14 ENCOUNTER — Telehealth: Payer: Self-pay | Admitting: Neurology

## 2023-10-14 NOTE — Telephone Encounter (Signed)
 I received labs from primary care, glucose 232, creatinine 1.19, AST 31, ALT 33, LDL 61, A1c 9.2, B12 761.  Patient was last seen in 2023 at our office, follow-up as needed.

## 2023-10-16 ENCOUNTER — Other Ambulatory Visit: Payer: Self-pay | Admitting: Internal Medicine

## 2023-10-17 ENCOUNTER — Other Ambulatory Visit: Payer: Self-pay | Admitting: Internal Medicine

## 2023-11-25 ENCOUNTER — Telehealth: Payer: Self-pay | Admitting: Neurology

## 2023-11-25 NOTE — Telephone Encounter (Signed)
 Pt called to confirm appointment.

## 2023-11-27 ENCOUNTER — Ambulatory Visit (INDEPENDENT_AMBULATORY_CARE_PROVIDER_SITE_OTHER): Admitting: Neurology

## 2023-11-27 ENCOUNTER — Encounter: Payer: Self-pay | Admitting: Neurology

## 2023-11-27 VITALS — BP 140/73 | HR 67 | Ht 64.0 in | Wt 181.0 lb

## 2023-11-27 DIAGNOSIS — B0229 Other postherpetic nervous system involvement: Secondary | ICD-10-CM | POA: Diagnosis not present

## 2023-11-27 DIAGNOSIS — G43709 Chronic migraine without aura, not intractable, without status migrainosus: Secondary | ICD-10-CM

## 2023-11-27 DIAGNOSIS — Z794 Long term (current) use of insulin: Secondary | ICD-10-CM

## 2023-11-27 DIAGNOSIS — E1142 Type 2 diabetes mellitus with diabetic polyneuropathy: Secondary | ICD-10-CM

## 2023-11-27 MED ORDER — NURTEC 75 MG PO TBDP: ORAL_TABLET | ORAL | 11 refills | Status: AC

## 2023-11-27 MED ORDER — LIDOCAINE-PRILOCAINE 2.5-2.5 % EX CREA: TOPICAL_CREAM | CUTANEOUS | 11 refills | Status: DC

## 2023-11-27 MED ORDER — PREGABALIN 75 MG PO CAPS: 75.0000 mg | ORAL_CAPSULE | Freq: Three times a day (TID) | ORAL | 5 refills | Status: AC

## 2023-11-27 NOTE — Progress Notes (Signed)
 Chief Complaint  Patient presents with   New Patient (Initial Visit)    Pt in 16,alone, Pt is referred for postherpetic neuralgia and peripheral neuropathy: pt stated that neuropathy is mainly at bed time, bilateral feet darkening,       ASSESSMENT AND PLAN  [name] is a 73 y.o. female   Posthepatic neuralgia Chronic migraine headache Diabetic peripheral neuropathy  Continued moderate headache at the right frontal region,  Higher dose of Lyrica 75 mg 3 times a day, Emla gel as needed  She had a history of migraine in the past, current headache responding to Maxalt as needed, she is not good candidate for triptan due to her multiple vascular risk factor, Nurtec as needed  Return To Clinic With NP In 6 Months   DIAGNOSTIC DATA (LABS, IMAGING, TESTING) - I reviewed patient records, labs, notes, testing and imaging myself where available.   MEDICAL HISTORY:  Alexis [name], is a 73 year old female, seen in request by her primary care from [facility] NP  Fatima Sanger for evaluation of right facial pain,  History is obtained from the patient and review of electronic medical records. I personally reviewed pertinent available imaging films in PACS.   PMHx of  DM-insulin dependent for more than 20 years. HTN HLD Uterine cancer, s/p complete hysterectomy A fib History of cervical decompression and fusion Congestive heart failure, History of left hip fracture,  She was seen by Dr. Anne Hahn and Maralyn Sago in the past for mild cognitive malfunction, Mini-Mental status was 28/30 in August 2023  She is alone at today's clinical visit, in April 2024, she was admitted to the hospital for altered mental status, high glucose level, right upper cervical, forehead pain, blurry vision,  She was diagnosed with right V1 shingle on November 25, 2022, underwent fluoroscopy guided LP, that was positive for VZV PCR at spinal fluid testing  CSF,  TP 100, Glucose 92,   WBC 66, 79% lymphocyte-macrophages RBC 13, culture negative,  VZV PCR positive,/HSV-1, 2, negative  She was treated with IV acyclovir, for 14 days, follow-up by ophthalmology as outpatient, mild blurry vision, but no visual loss, OD 20/50 today, Posthospital discharge, she was experience frequent transient sharp pain at the right frontal region, now improved, she did have a history of migraine in the past, was given prescription of Maxalt many years ago, now for moderate severe headache she uses Maxalt intermittently, that was helpful  She continue complains of right upper eyelid skin sensitivity, excessive eye discharge, mild blurry vision, tolerating Lyrica 75 mg twice a day, does help her symptoms  MRI of brain w/wo in April 2024,  1. No acute intracranial abnormality. 2. Periventricular white matter changes are mildly advanced for age. The finding is nonspecific but can be seen in the setting of chronic microvascular ischemia, a demyelinating process such as multiple sclerosis, vasculitis, complicated migraine headaches, or as the sequelae of a prior infectious or inflammatory process.  Lab in April 2024: A1C 9.8,    PHYSICAL EXAM:   Vitals:   11/27/23 0947  BP: (!) 140/73  Pulse: 67  Weight: 181 lb (82.1 kg)  Height: 5\' 4"  (1.626 m)     Body mass index is 31.07 kg/m.  PHYSICAL EXAMNIATION:  Gen: NAD, conversant, well nourised, well groomed                     Cardiovascular: Regular rate rhythm, no peripheral edema, warm, nontender. Eyes: Conjunctivae clear without  exudates or hemorrhage Neck: Supple, no carotid bruits. Pulmonary: Clear to auscultation bilaterally   NEUROLOGICAL EXAM:  MENTAL STATUS: Speech/cognition: Awake, alert, oriented to history taking and casual conversation CRANIAL NERVES: CN II: Visual fields are full to confrontation. Pupils are round equal and briskly reactive to light. OD 20/50 today CN III, IV, VI: extraocular movement are normal. No  ptosis. CN V: Facial sensation is intact to light touch, bilateral corneal reflex are normal CN VII: Face is symmetric with normal eye closure  CN VIII: Hearing is normal to causal conversation. CN IX, X: Phonation is normal. CN XI: Head turning and shoulder shrug are intact  MOTOR: There is no pronator drift of out-stretched arms. Muscle bulk and tone are normal. Muscle strength is normal.  REFLEXES: Reflexes are 1 and symmetric at the biceps, triceps, knees, and absent at ankles. Plantar responses are flexor.  SENSORY: Length-dependent decreased light touch pinprick to above ankle level  COORDINATION: There is no trunk or limb dysmetria noted.  GAIT/STANCE: Push-up to get up from seated position, steady gait  REVIEW OF SYSTEMS:  Full 14 system review of systems performed and notable only for as above All other review of systems were negative.   ALLERGIES: Allergies  Allergen Reactions   Aspirin Other (See Comments)    Reaction: Vasculitis per MD   Codeine Itching and Rash    Tolerated Norco 12/07/20 with no reports of rash/itching. Patient endorsed allergy >30 yr ago and hasn't had reaction since. Climb the walls per pt   Naproxen Nausea Only    Reaction: Makes stomach upset/irritated.   Sulfonamide Derivatives Itching and Rash    HOME MEDICATIONS: Current Outpatient Medications  Medication Sig Dispense Refill   acetaminophen (TYLENOL) 325 MG tablet Take 2 tablets (650 mg total) by mouth every 6 (six) hours as needed for mild pain or moderate pain. (Patient taking differently: Take 325 mg by mouth every 6 (six) hours as needed for mild pain (pain score 1-3), moderate pain (pain score 4-6) or headache.)     ACIPHEX 20 MG tablet Take 20 mg by mouth in the morning and at bedtime.     ALPRAZolam (XANAX) 0.5 MG tablet Take 0.5 mg by mouth at bedtime.     amLODipine (NORVASC) 5 MG tablet Take 1 tablet (5 mg total) by mouth daily. 90 tablet 2   atorvastatin (LIPITOR) 20 MG  tablet TAKE 1 TABLET BY MOUTH EVERY DAY 90 tablet 1   b complex vitamins tablet Take 1 tablet by mouth at bedtime.     Calcium Carb-Cholecalciferol (CALCIUM 1000 + D PO) Take by mouth. Pt says takes half of tab     ELIQUIS 5 MG TABS tablet TAKE 1 TABLET BY MOUTH TWICE A DAY 180 tablet 1   FARXIGA 10 MG TABS tablet Take 10 mg by mouth in the morning.     ferrous sulfate 325 (65 FE) MG tablet Take 1 tablet (325 mg total) by mouth every other day. 30 tablet 3   fish oil-omega-3 fatty acids 1000 MG capsule Take 1 g by mouth at bedtime.     furosemide (LASIX) 40 MG tablet TAKE 1 TABLET BY MOUTH DAILY AS NEEDED 90 tablet 1   insulin lispro (HUMALOG) 100 UNIT/ML KwikPen Inject 14 Units into the skin 3 (three) times daily.     metoprolol succinate (TOPROL-XL) 50 MG 24 hr tablet Take 1 tablet (50 mg total) by mouth daily. Take with or immediately following a meal. (Patient taking differently: Take 50  mg by mouth 2 (two) times daily. Take with or immediately following a meal.) 30 tablet 2   OZEMPIC, 2 MG/DOSE, 8 MG/3ML SOPN Inject 1 Pen into the skin every 7 (seven) days.     pantoprazole (PROTONIX) 40 MG tablet Take 1 tablet (40 mg total) by mouth 2 (two) times daily. 60 tablet 2   pregabalin (LYRICA) 75 MG capsule Take 75 mg by mouth 2 (two) times daily.     TRESIBA FLEXTOUCH 100 UNIT/ML FlexTouch Pen Inject 25 Units into the skin at bedtime.     valsartan (DIOVAN) 160 MG tablet Take 1 tablet (160 mg total) by mouth daily. 30 tablet 3   venlafaxine XR (EFFEXOR-XR) 150 MG 24 hr capsule Take 150 mg by mouth 2 (two) times daily.     zaleplon (SONATA) 5 MG capsule Take 5 mg by mouth at bedtime.     No current facility-administered medications for this visit.    PAST MEDICAL HISTORY: Past Medical History:  Diagnosis Date   Anxiety    Arthritis    "knees" (05/25/2014)   Chronic back pain    Depression    Diabetes mellitus type II    Diabetic peripheral neuropathy (HCC) 10/20/2018   Diverticulitis     Fatty liver    Fibromyalgia    Gastric polyp    hyperplastic   Gastroparesis    "recently dx'd" (05/25/2014)   GERD (gastroesophageal reflux disease)    H/O hiatal hernia    Hx of gastritis    Hyperlipidemia    Hypertension    Irritable bowel syndrome (IBS)    Memory difficulties 09/18/2017   Migraine without aura, without mention of intractable migraine without mention of status migrainosus    "related to allergies; have them in the spring and fall" (05/25/2014)   Obesity    Sleep apnea    "wore mask; took it off in my sleep; quit wearing it" (05/25/2014)   Uterine cancer (HCC) dx'd 2000   surg only   Vasculitis (HCC)    "irritates my legs"   Wears glasses    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    PAST SURGICAL HISTORY: Past Surgical History:  Procedure Laterality Date   ANTERIOR CERVICAL DECOMPRESSION/DISCECTOMY FUSION 4 LEVELS N/A 05/07/2018   Procedure: ANTERIOR CERVICAL DECOMPRESSION FUSION, CERVICAL 4-5,CERVICAL 5-6, CERVICAL 6-7 WITH INSTRUMENTATION AND ALLOGRAFT;  Surgeon: Estill Bamberg, MD;  Location: [facility] OR;  Service: Orthopedics;  Laterality: N/A;   APPENDECTOMY  ~ 1967   BIOPSY  07/27/2021   Procedure: BIOPSY;  Surgeon: Benancio Deeds, MD;  Location: [facility] ENDOSCOPY;  Service: Gastroenterology;;   BREAST CYST EXCISION Right 1990   COLONOSCOPY     COLONOSCOPY WITH PROPOFOL N/A 07/27/2021   Procedure: COLONOSCOPY WITH PROPOFOL;  Surgeon: Benancio Deeds, MD;  Location: [facility] ENDOSCOPY;  Service: Gastroenterology;  Laterality: N/A;   ESOPHAGOGASTRODUODENOSCOPY (EGD) WITH PROPOFOL N/A 07/27/2021   Procedure: ESOPHAGOGASTRODUODENOSCOPY (EGD) WITH PROPOFOL;  Surgeon: Benancio Deeds, MD;  Location: [facility] ENDOSCOPY;  Service: Gastroenterology;  Laterality: N/A;   HEMOSTASIS CLIP PLACEMENT  07/27/2021   Procedure: HEMOSTASIS CLIP PLACEMENT;  Surgeon: Benancio Deeds, MD;  Location: [facility] ENDOSCOPY;  Service: Gastroenterology;;   LAPAROSCOPIC CHOLECYSTECTOMY  1990    LEFT HEART CATHETERIZATION WITH CORONARY ANGIOGRAM N/A 05/26/2014   Procedure: LEFT HEART CATHETERIZATION WITH CORONARY ANGIOGRAM;  Surgeon: Lennette Bihari, MD;  Location: [facility] CATH LAB;  Service: Cardiovascular;  Laterality: N/A;   POLYPECTOMY  07/27/2021   Procedure: POLYPECTOMY;  Surgeon: Benancio Deeds, MD;  Location: [facility] ENDOSCOPY;  Service: Gastroenterology;;   Daryle Eon  07/27/2021   Procedure: Daryle Eon;  Surgeon: Ace Holder, MD;  Location: [facility] ENDOSCOPY;  Service: Gastroenterology;;   SINUS SURGERY WITH INSTATRAK  2000   TOTAL HIP ARTHROPLASTY Left 12/05/2020   Procedure: TOTAL HIP ARTHROPLASTY ANTERIOR APPROACH;  Surgeon: Adonica Hoose, MD;  Location: [facility] OR;  Service: Orthopedics;  Laterality: Left;   TUBAL LIGATION  ~ 1982   VAGINAL HYSTERECTOMY  2000    FAMILY HISTORY: Family History  Problem Relation Age of Onset   Colon polyps Father    Heart disease Father    Breast cancer Sister    COPD Brother    Irritable bowel syndrome Daughter    Ovarian cancer Maternal Aunt    Stomach cancer Maternal Aunt    Diabetes Maternal Aunt    Colon cancer Maternal Uncle    Heart disease Maternal Uncle    Heart disease Other        Grandparents   Esophageal cancer Neg Hx    Pancreatic cancer Neg Hx     SOCIAL HISTORY: Social History   Socioeconomic History   Marital status: Married    Spouse name: Dharma Pare   Number of children: 2   Years of education: Not on file   Highest education level: Some college, no degree  Occupational History   Occupation: Retired    Associate Professor: RETIRED  Tobacco Use   Smoking status: Never   Smokeless tobacco: Never  Vaping Use   Vaping status: Never Used  Substance and Sexual Activity   Alcohol use: No   Drug use: No   Sexual activity: Not Currently  Other Topics Concern   Not on file  Social History Narrative   Daily Caffeine, Coke   Social Drivers of Health   Financial Resource Strain: Low Risk  (04/27/2021)    Overall Financial Resource Strain (CARDIA)    Difficulty of Paying Living Expenses: Not very hard  Food Insecurity: No Food Insecurity (11/28/2022)   Hunger Vital Sign    Worried About Running Out of Food in the Last Year: Never true    Ran Out of Food in the Last Year: Never true  Transportation Needs: No Transportation Needs (11/28/2022)   PRAPARE - Administrator, Civil Service (Medical): No    Lack of Transportation (Non-Medical): No  Physical Activity: Not on file  Stress: Not on file  Social Connections: Not on file  Intimate Partner Violence: Not At Risk (11/28/2022)   Humiliation, Afraid, Rape, and Kick questionnaire    Fear of Current or Ex-Partner: No    Emotionally Abused: No    Physically Abused: No    Sexually Abused: No      Phebe Brasil, M.D. Ph.D.  [facility] [address] [place], [place] [address] Ph: [phone] Fax: [phone]  CC:  Jhon Moselle, FNP [address] [place],  [place] [address]  Jhon Moselle, FNP

## 2023-11-28 ENCOUNTER — Telehealth: Payer: Self-pay

## 2023-11-28 ENCOUNTER — Other Ambulatory Visit (HOSPITAL_COMMUNITY): Payer: Self-pay

## 2023-11-28 NOTE — Telephone Encounter (Signed)
 Pharmacy Patient Advocate Encounter  Received notification from [facility] that Prior Authorization for Nurtec 75MG  dispersible tablets has been APPROVED from 11/28/2023 to 08/11/2024. Unable to obtain price due to refill too soon rejection, last fill date 11/28/2023 next available fill date5/06/2024   PA #/Case ID/Reference #: PA Case ID #: [id number]

## 2023-11-28 NOTE — Telephone Encounter (Signed)
 Pharmacy Patient Advocate Encounter   Received notification from Fax that prior authorization for Nurtec 75MG  dispersible tablets is required/requested.   Insurance verification completed.   The patient is insured through [facility] .   Per test claim: PA required; PA submitted to above mentioned insurance via CoverMyMeds Key/confirmation #/EOC [id number] Status is pending

## 2023-12-03 ENCOUNTER — Other Ambulatory Visit: Payer: Self-pay | Admitting: Internal Medicine

## 2023-12-03 DIAGNOSIS — I480 Paroxysmal atrial fibrillation: Secondary | ICD-10-CM

## 2023-12-04 NOTE — Telephone Encounter (Addendum)
 Eliquis  5mg  refill request received. Patient is 73 years old, weight-82.1kg, Crea-0.81 on 12/13/22 via [facility] from [facility] [place], and last seen by Friddie Jetty on 03/14/23. Dose is appropriate based on dosing criteria.

## 2024-02-20 ENCOUNTER — Encounter: Payer: Self-pay | Admitting: Gastroenterology

## 2024-02-20 ENCOUNTER — Ambulatory Visit: Payer: Self-pay | Admitting: Gastroenterology

## 2024-02-20 ENCOUNTER — Other Ambulatory Visit (INDEPENDENT_AMBULATORY_CARE_PROVIDER_SITE_OTHER)

## 2024-02-20 ENCOUNTER — Ambulatory Visit (INDEPENDENT_AMBULATORY_CARE_PROVIDER_SITE_OTHER): Admitting: Gastroenterology

## 2024-02-20 VITALS — BP 110/78 | HR 57 | Ht 64.0 in | Wt 177.8 lb

## 2024-02-20 DIAGNOSIS — R7989 Other specified abnormal findings of blood chemistry: Secondary | ICD-10-CM | POA: Insufficient documentation

## 2024-02-20 DIAGNOSIS — R159 Full incontinence of feces: Secondary | ICD-10-CM | POA: Diagnosis not present

## 2024-02-20 DIAGNOSIS — Z7901 Long term (current) use of anticoagulants: Secondary | ICD-10-CM

## 2024-02-20 DIAGNOSIS — R194 Change in bowel habit: Secondary | ICD-10-CM

## 2024-02-20 DIAGNOSIS — K760 Fatty (change of) liver, not elsewhere classified: Secondary | ICD-10-CM

## 2024-02-20 DIAGNOSIS — I480 Paroxysmal atrial fibrillation: Secondary | ICD-10-CM | POA: Diagnosis not present

## 2024-02-20 DIAGNOSIS — Z860101 Personal history of adenomatous and serrated colon polyps: Secondary | ICD-10-CM

## 2024-02-20 DIAGNOSIS — Z5181 Encounter for therapeutic drug level monitoring: Secondary | ICD-10-CM

## 2024-02-20 LAB — COMPREHENSIVE METABOLIC PANEL WITH GFR
ALT: 23 U/L (ref 0–35)
AST: 23 U/L (ref 0–37)
Albumin: 3.7 g/dL (ref 3.5–5.2)
Alkaline Phosphatase: 109 U/L (ref 39–117)
BUN: 21 mg/dL (ref 6–23)
CO2: 37 meq/L — ABNORMAL HIGH (ref 19–32)
Calcium: 9 mg/dL (ref 8.4–10.5)
Chloride: 97 meq/L (ref 96–112)
Creatinine, Ser: 1.23 mg/dL — ABNORMAL HIGH (ref 0.40–1.20)
GFR: 43.91 mL/min — ABNORMAL LOW (ref >60.00)
Glucose, Bld: 242 mg/dL — ABNORMAL HIGH (ref 70–99)
Potassium: 3.7 meq/L (ref 3.5–5.1)
Sodium: 141 meq/L (ref 135–145)
Total Bilirubin: 0.5 mg/dL (ref 0.2–1.2)
Total Protein: 6.9 g/dL (ref 6.0–8.3)

## 2024-02-20 LAB — CBC WITH DIFFERENTIAL/PLATELET
Basophils Absolute: 0.1 K/uL (ref 0.0–0.1)
Basophils Relative: 0.9 % (ref 0.0–3.0)
Eosinophils Absolute: 0 K/uL (ref 0.0–0.7)
Eosinophils Relative: 0.5 % (ref 0.0–5.0)
HCT: 38.8 % (ref 36.0–46.0)
Hemoglobin: 12.9 g/dL (ref 12.0–15.0)
Lymphocytes Relative: 21.6 % (ref 12.0–46.0)
Lymphs Abs: 1.8 K/uL (ref 0.7–4.0)
MCHC: 33.4 g/dL (ref 30.0–36.0)
MCV: 90.8 fl (ref 78.0–100.0)
Monocytes Absolute: 0.8 K/uL (ref 0.1–1.0)
Monocytes Relative: 10 % (ref 3.0–12.0)
Neutro Abs: 5.5 K/uL (ref 1.4–7.7)
Neutrophils Relative %: 67 % (ref 43.0–77.0)
Platelets: 223 K/uL (ref 150.0–400.0)
RBC: 4.27 Mil/uL (ref 3.87–5.11)
RDW: 13.4 % (ref 11.5–15.5)
WBC: 8.2 K/uL (ref 4.0–10.5)

## 2024-02-20 LAB — PROTIME-INR
INR: 1.4 ratio — ABNORMAL HIGH (ref 0.8–1.0)
Prothrombin Time: 14.5 s — ABNORMAL HIGH (ref 9.6–13.1)

## 2024-02-20 MED ORDER — NA SULFATE-K SULFATE-MG SULF 17.5-3.13-1.6 GM/177ML PO SOLN: 1.0000 | Freq: Once | ORAL | 0 refills | Status: AC

## 2024-02-20 NOTE — Patient Instructions (Signed)
 Your provider has requested that you go to the basement level for lab work before leaving today. Press B on the elevator. The lab is located at the first door on the left as you exit the elevator.  Start Benefiber  2 teaspoons in 8 ounces of liquid daily.  Referral placed to Pelvic Floor PT.   You have been scheduled for a colonoscopy. Please follow written instructions given to you at your visit today.   If you use inhalers (even only as needed), please bring them with you on the day of your procedure.  DO NOT TAKE 7 DAYS PRIOR TO TEST- Trulicity  (dulaglutide ) Ozempic, Wegovy (semaglutide) Mounjaro (tirzepatide) Bydureon Bcise (exanatide extended release)  DO NOT TAKE 1 DAY PRIOR TO YOUR TEST Rybelsus (semaglutide) Adlyxin (lixisenatide) Victoza (liraglutide) Byetta (exanatide) ________________________________________________________________

## 2024-02-20 NOTE — Progress Notes (Signed)
 02/20/2024 Linton JINNY Dimes [id number] Jun 10, 1951   HISTORY OF PRESENT ILLNESS: This is a 73 year old female who is a patient of Dr. Clayburn.  She has history of type 2 diabetes mellitus, fibromyalgia, fatty liver, hypertension, hyperlipidemia, IBS, GERD, paroxysmal atrial fibrillation on Eliquis .  She is here today to discuss issues with her bowels.  She says that for the past 6 months or so she has issues with every time that she eats within 30 minutes she has to go to the bathroom.  Sometimes that stool is normal, sometimes looser or softer, but not necessarily diarrhea.  Her biggest issue is with not being able to control her bowel movements.  She has episodes of incontinence.  She says that sometimes even with bending over she will pass some stool.  She says sometimes it will just come without feeling it and then she will start smelling something and go to the bathroom and she will have passed some stool in her undergarments.  Colonoscopy 07/2021:  - The examined portion of the ileum was normal. - Two 4 mm polyps in the ascending colon, removed with a cold snare. Resected and retrieved. - One 5 mm polyp in the transverse colon, removed with a cold snare. Resected and retrieved. - Two 3 to 6 mm polyps in the descending colon, removed with a cold snare. Resected and retrieved. - One 5 mm polyp in the sigmoid colon, removed with a cold snare. Resected and retrieved. - Internal hemorrhoids. - The examination was otherwise normal. - Biopsies were taken with a cold forceps from the right colon, left colon and transverse colon for evaluation of microscopic colitis.  COLON, ASCENDING, TRANSVERSE, DESCENDING AND SIGMOID, POLYPECTOMY: - Tubular adenoma. - Negative for high grade dysplasia and malignancy.  COLON, RANDOM, BIOPSY: - Colonic mucosa with no significant pathologic alteration. - Negative for active inflammation and features of chronicity. - Negative for microscopic colitis, dysplasia,  and malignancy.   In regards to elevated LFTs/fatty liver, she had extensive evaluation with serologies, etc. last year which ruled out any other causes of chronic liver disease.  Was found to hepatic steatosis on ultrasound.  Past Medical History:  Diagnosis Date   Anxiety    Arthritis    knees (05/25/2014)   Chronic back pain    Depression    Diabetes mellitus type II    Diabetic peripheral neuropathy (HCC) 10/20/2018   Diverticulitis    Fatty liver    Fibromyalgia    Gastric polyp    hyperplastic   Gastroparesis    recently dx'd (05/25/2014)   GERD (gastroesophageal reflux disease)    H/O hiatal hernia    Hx of gastritis    Hyperlipidemia    Hypertension    Irritable bowel syndrome (IBS)    Memory difficulties 09/18/2017   Migraine without aura, without mention of intractable migraine without mention of status migrainosus    related to allergies; have them in the spring and fall (05/25/2014)   Obesity    Sleep apnea    wore mask; took it off in my sleep; quit wearing it (05/25/2014)   Uterine cancer (HCC) dx'd 2000   surg only   Vasculitis (HCC)    irritates my legs   Wears glasses    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   Past Surgical History:  Procedure Laterality Date   ANTERIOR CERVICAL DECOMPRESSION/DISCECTOMY FUSION 4 LEVELS N/A 05/07/2018   Procedure: ANTERIOR CERVICAL DECOMPRESSION FUSION, CERVICAL 4-5,CERVICAL 5-6, CERVICAL 6-7 WITH INSTRUMENTATION AND ALLOGRAFT;  Surgeon:  Beuford Anes, MD;  Location: [facility] OR;  Service: Orthopedics;  Laterality: N/A;   APPENDECTOMY  ~ 1967   BIOPSY  07/27/2021   Procedure: BIOPSY;  Surgeon: Leigh Elspeth SQUIBB, MD;  Location: [facility] ENDOSCOPY;  Service: Gastroenterology;;   BREAST CYST EXCISION Right 1990   COLONOSCOPY     COLONOSCOPY WITH PROPOFOL  N/A 07/27/2021   Procedure: COLONOSCOPY WITH PROPOFOL ;  Surgeon: Leigh Elspeth SQUIBB, MD;  Location: [facility] ENDOSCOPY;  Service: Gastroenterology;  Laterality: N/A;    ESOPHAGOGASTRODUODENOSCOPY (EGD) WITH PROPOFOL  N/A 07/27/2021   Procedure: ESOPHAGOGASTRODUODENOSCOPY (EGD) WITH PROPOFOL ;  Surgeon: Leigh Elspeth SQUIBB, MD;  Location: [facility] ENDOSCOPY;  Service: Gastroenterology;  Laterality: N/A;   HEMOSTASIS CLIP PLACEMENT  07/27/2021   Procedure: HEMOSTASIS CLIP PLACEMENT;  Surgeon: Leigh Elspeth SQUIBB, MD;  Location: [facility] ENDOSCOPY;  Service: Gastroenterology;;   LAPAROSCOPIC CHOLECYSTECTOMY  1990   LEFT HEART CATHETERIZATION WITH CORONARY ANGIOGRAM N/A 05/26/2014   Procedure: LEFT HEART CATHETERIZATION WITH CORONARY ANGIOGRAM;  Surgeon: Debby DELENA Sor, MD;  [facility] CATH LAB;  Service: Cardiovascular;  Laterality: N/A;   POLYPECTOMY  07/27/2021   Procedure: POLYPECTOMY;  Surgeon: Leigh Elspeth SQUIBB, MD;  Location: [facility] ENDOSCOPY;  Service: Gastroenterology;;   MATIAS  07/27/2021   Procedure: MATIAS;  Surgeon: Leigh Elspeth SQUIBB, MD;  Location: [facility] ENDOSCOPY;  Service: Gastroenterology;;   SINUS SURGERY WITH INSTATRAK  2000   TOTAL HIP ARTHROPLASTY Left 12/05/2020   Procedure: TOTAL HIP ARTHROPLASTY ANTERIOR APPROACH;  Surgeon: Fidel Rogue, MD;  Location: [facility] OR;  Service: Orthopedics;  Laterality: Left;   TUBAL LIGATION  ~ 1982   VAGINAL HYSTERECTOMY  2000    reports that she has never smoked. She has never used smokeless tobacco. She reports that she does not drink alcohol and does not use drugs. family history includes Breast cancer in her sister; COPD in her brother; Colon cancer in her maternal uncle; Colon polyps in her father; Diabetes in her maternal aunt; Heart disease in her father, maternal uncle, and another family member; Irritable bowel syndrome in her daughter; Ovarian cancer in her maternal aunt; Stomach cancer in her maternal aunt. Allergies  Allergen Reactions   Aspirin  Other (See Comments)    Reaction: Vasculitis per MD   Codeine Itching and Rash    Tolerated Norco 12/07/20 with no reports of rash/itching. Patient  endorsed allergy >30 yr ago and hasn't had reaction since. Climb the walls per pt   Naproxen Nausea Only    Reaction: Makes stomach upset/irritated.   Sulfonamide Derivatives Itching and Rash      Outpatient Encounter Medications as of 02/20/2024  Medication Sig   acetaminophen  (TYLENOL ) 325 MG tablet Take 2 tablets (650 mg total) by mouth every 6 (six) hours as needed for mild pain or moderate pain. (Patient taking differently: Take 325 mg by mouth every 6 (six) hours as needed for mild pain (pain score 1-3), moderate pain (pain score 4-6) or headache.)   ACIPHEX  20 MG tablet Take 20 mg by mouth in the morning and at bedtime.   ALPRAZolam  (XANAX ) 0.5 MG tablet Take 0.5 mg by mouth at bedtime.   amLODipine  (NORVASC ) 5 MG tablet Take 1 tablet (5 mg total) by mouth daily.   atorvastatin  (LIPITOR) 20 MG tablet TAKE 1 TABLET BY MOUTH EVERY DAY   b complex vitamins tablet Take 1 tablet by mouth at bedtime.   Calcium  Carb-Cholecalciferol (CALCIUM  1000 + D PO) Take by mouth. Pt says takes half of tab   ELIQUIS  5 MG TABS tablet TAKE  1 TABLET BY MOUTH TWICE A DAY   FARXIGA  10 MG TABS tablet Take 10 mg by mouth in the morning.   ferrous sulfate  325 (65 FE) MG tablet Take 1 tablet (325 mg total) by mouth every other day.   fish oil-omega-3 fatty acids 1000 MG capsule Take 1 g by mouth at bedtime.   furosemide  (LASIX ) 40 MG tablet TAKE 1 TABLET BY MOUTH DAILY AS NEEDED   insulin  lispro (HUMALOG) 100 UNIT/ML KwikPen Inject 14 Units into the skin 3 (three) times daily.   metoprolol  succinate (TOPROL -XL) 50 MG 24 hr tablet Take 1 tablet (50 mg total) by mouth daily. Take with or immediately following a meal. (Patient taking differently: Take 50 mg by mouth 2 (two) times daily. Take with or immediately following a meal.)   OZEMPIC, 2 MG/DOSE, 8 MG/3ML SOPN Inject 1 Pen into the skin every 7 (seven) days.   pantoprazole  (PROTONIX ) 40 MG tablet Take 1 tablet (40 mg total) by mouth 2 (two) times daily.    pregabalin  (LYRICA ) 75 MG capsule Take 1 capsule (75 mg total) by mouth 3 (three) times daily.   Rimegepant Sulfate (NURTEC) 75 MG TBDP Take 1 tab at onset of migraine.  May repeat in 2 hrs, if needed.  Max dose: 2 tabs/day. This is a 30 day prescription.   TRESIBA FLEXTOUCH 100 UNIT/ML FlexTouch Pen Inject 25 Units into the skin at bedtime.   valsartan  (DIOVAN ) 160 MG tablet Take 1 tablet (160 mg total) by mouth daily.   venlafaxine  XR (EFFEXOR -XR) 150 MG 24 hr capsule Take 150 mg by mouth 2 (two) times daily.   zaleplon (SONATA) 5 MG capsule Take 5 mg by mouth at bedtime.   [DISCONTINUED] lidocaine -prilocaine  (EMLA ) cream 1 gram qid as needed,   No facility-administered encounter medications on file as of 02/20/2024.    REVIEW OF SYSTEMS  : All other systems reviewed and negative except where noted in the History of Present Illness.   PHYSICAL EXAM: BP 110/78   Pulse (!) 57   Ht 5' 4 (1.626 m)   Wt 177 lb 12.8 oz (80.6 kg)   BMI 30.52 kg/m  General: Well developed white female in no acute distress Head: Normocephalic and atraumatic Eyes:  Sclerae anicteric, conjunctiva pink. Ears: Normal auditory acuity Lungs: Clear throughout to auscultation; no W/R/G. Heart: Regular rate and rhythm; no M/R/G. Musculoskeletal: Symmetrical with no gross deformities  Skin: No lesions on visible extremities Extremities: No edema  Neurological: Alert oriented x 4, grossly non-focal Psychological:  Alert and cooperative. Normal mood and affect  ASSESSMENT AND PLAN: *Altered bowel habits and fecal incontinence: She does have some looser/soft stools, but it sounds like the issue more is the lack of control/being able to hold the bowel movement.  Leaking stool when she bends over, small amounts of stool in her undergarments without awareness, etc.    -We discussed pelvic floor physical therapy and will replace a referral for that. -She is due for colonoscopy in December.  Going to go ahead and  schedule that, will be done few months sooner than her recommended date.  The risks, benefits, and alternatives to colonoscopy were discussed with the patient and she consents to proceed.  -Recommended she try some Benefiber starting with 2 teaspoons mixed in 8 ounces of liquid daily to try to help create some bulk to the stool.  *Personal history of colon polyps: Had several adenomatous polyps removed in December 2022.  Repeat was recommended in 3 years.  *  Fatty liver/elevated LFTs: Had extensive evaluation last year for elevated LFTs, which was negative for other causes of chronic liver disease.  Ultrasound showed hepatic steatosis  -Will update labs today with CBC, CMP, PT/INR.  Will calculated fib 4 score from his updated labs.   -We discussed good diet, exercise, weight loss, good blood sugar cholesterol control, etc.  *Chronic anticoagulation with Eliquis  for paroxysmal atrial fibrillation:  Will hold Eliquis  for 2 days prior to endoscopic procedures - will instruct when and how to resume after procedure. Benefits and risks of procedure explained including risks of bleeding, perforation, infection, missed lesions, reactions to medications and possible need for hospitalization and surgery for complications. Additional rare but real risk of stroke or other vascular clotting events off of Eliquis  also explained and need to seek urgent help if any signs of these problems occur. Will communicate by phone or EMR with patient's  prescribing provider to confirm that holding Eliquis  is reasonable in this case.    **Addendum: Updated labs show a calculated fib 4 score of 4.55, so just above the lower cutoff value.   CC:  Royden Ronal Czar, FNP

## 2024-02-22 NOTE — Progress Notes (Signed)
 ____________________________________________________________  Attending physician addendum:  Thank you for sending this case to me. I have reviewed the entire note and agree with the plan.  PPI, iron, GLP-1 use potentially affect her bowel form and frequency.  Agree it sounds most like an incontinence issue and some fiber is recommended as well as pelvic floor therapy.  Incidentally, there is a typo in your note regarding the fib 4 which is 1.55 rather than 4.55  That score is largely driven by her age, and her LFTs are normal.  Victory Brand, MD  ____________________________________________________________

## 2024-02-27 LAB — HM DIABETES EYE EXAM

## 2024-03-11 ENCOUNTER — Other Ambulatory Visit: Payer: Self-pay | Admitting: Internal Medicine

## 2024-03-29 ENCOUNTER — Other Ambulatory Visit: Payer: Self-pay | Admitting: Internal Medicine

## 2024-03-30 ENCOUNTER — Telehealth: Payer: Self-pay | Admitting: *Deleted

## 2024-03-30 NOTE — Telephone Encounter (Signed)
 [facility] Pre-operative Risk Assessment     Request for surgical clearance:     Endoscopy Procedure  What type of surgery is being performed?     colonoscopy  When is this surgery scheduled?     05/04/24  What type of clearance is required ?   Pharmacy  Are there any medications that need to be held prior to surgery and how long? Eliquis  2 days  Practice name and name of physician performing surgery?      [facility]  What is your office phone and fax number?      Phone- [phone]  Fax- [phone]  Anesthesia type (None, local, MAC, general) ?       MAC   Please route your response to Powell Misty, CMA

## 2024-04-02 NOTE — Telephone Encounter (Signed)
 Resending to clarify date which was omitted on initial request from [facility] please advise on holding Eliquis  for 2 days prior to colonoscopy scheduled for 05/04/2024. Last labs (CBC and CMET) on 02/20/2024. Thank you.

## 2024-04-04 NOTE — Progress Notes (Unsigned)
 Cardiology Clinic Note   Patient Name: Alexis [name] Date of Encounter: 04/06/2024  Primary Care Provider:  Royden Ronal Czar, FNP Primary Cardiologist:  Alexis JAYSON Maxcy, MD  Patient Profile    Alexis [name] 73 year old female presents the clinic today for follow-up evaluation of her diastolic CHF, paroxysmal atrial fibrillation, and hypertension.  Past Medical History    Past Medical History:  Diagnosis Date   Anxiety    Arthritis    knees (05/25/2014)   Chronic back pain    Depression    Diabetes mellitus type II    Diabetic peripheral neuropathy (HCC) 10/20/2018   Diverticulitis    Fatty liver    Fibromyalgia    Gastric polyp    hyperplastic   Gastroparesis    recently dx'd (05/25/2014)   GERD (gastroesophageal reflux disease)    H/O hiatal hernia    Hx of gastritis    Hyperlipidemia    Hypertension    Irritable bowel syndrome (IBS)    Memory difficulties 09/18/2017   Migraine without aura, without mention of intractable migraine without mention of status migrainosus    related to allergies; have them in the spring and fall (05/25/2014)   Obesity    Sleep apnea    wore mask; took it off in my sleep; quit wearing it (05/25/2014)   Uterine cancer (HCC) dx'd 2000   surg only   Vasculitis (HCC)    irritates my legs   Wears glasses    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   Past Surgical History:  Procedure Laterality Date   ANTERIOR CERVICAL DECOMPRESSION/DISCECTOMY FUSION 4 LEVELS N/A 05/07/2018   Procedure: ANTERIOR CERVICAL DECOMPRESSION FUSION, CERVICAL 4-5,CERVICAL 5-6, CERVICAL 6-7 WITH INSTRUMENTATION AND ALLOGRAFT;  Surgeon: Alexis Anes, MD;  Location: [facility] OR;  Service: Orthopedics;  Laterality: N/A;   APPENDECTOMY  ~ 1967   BIOPSY  07/27/2021   Procedure: BIOPSY;  Surgeon: Alexis Elspeth SQUIBB, MD;  Location: [facility] ENDOSCOPY;  Service: Gastroenterology;;   BREAST CYST EXCISION Right 1990   COLONOSCOPY     COLONOSCOPY WITH PROPOFOL  N/A  07/27/2021   Procedure: COLONOSCOPY WITH PROPOFOL ;  Surgeon: Alexis Elspeth SQUIBB, MD;  Location: [facility] ENDOSCOPY;  Service: Gastroenterology;  Laterality: N/A;   ESOPHAGOGASTRODUODENOSCOPY (EGD) WITH PROPOFOL  N/A 07/27/2021   Procedure: ESOPHAGOGASTRODUODENOSCOPY (EGD) WITH PROPOFOL ;  Surgeon: Alexis Elspeth SQUIBB, MD;  Location: [facility] ENDOSCOPY;  Service: Gastroenterology;  Laterality: N/A;   HEMOSTASIS CLIP PLACEMENT  07/27/2021   Procedure: HEMOSTASIS CLIP PLACEMENT;  Surgeon: Alexis Elspeth SQUIBB, MD;  Location: [facility] ENDOSCOPY;  Service: Gastroenterology;;   LAPAROSCOPIC CHOLECYSTECTOMY  1990   LEFT HEART CATHETERIZATION WITH CORONARY ANGIOGRAM N/A 05/26/2014   Procedure: LEFT HEART CATHETERIZATION WITH CORONARY ANGIOGRAM;  Surgeon: Alexis DELENA Sor, MD;  Location: [facility] CATH LAB;  Service: Cardiovascular;  Laterality: N/A;   POLYPECTOMY  07/27/2021   Procedure: POLYPECTOMY;  Surgeon: Alexis Elspeth SQUIBB, MD;  Location: [facility] ENDOSCOPY;  Service: Gastroenterology;;   MATIAS  07/27/2021   Procedure: MATIAS;  Surgeon: Alexis Elspeth SQUIBB, MD;  Location: [facility] ENDOSCOPY;  Service: Gastroenterology;;   SINUS SURGERY WITH INSTATRAK  2000   TOTAL HIP ARTHROPLASTY Left 12/05/2020   Procedure: TOTAL HIP ARTHROPLASTY ANTERIOR APPROACH;  Surgeon: Alexis Rogue, MD;  Location: [facility] OR;  Service: Orthopedics;  Laterality: Left;   TUBAL LIGATION  ~ 1982   VAGINAL HYSTERECTOMY  2000    Allergies  Allergies  Allergen Reactions   Aspirin  Other (See Comments)    Reaction: Vasculitis per MD   Codeine Itching  and Rash    Tolerated Norco 12/07/20 with no reports of rash/itching. Patient endorsed allergy >30 yr ago and hasn't had reaction since. Climb the walls per pt   Naproxen Nausea Only    Reaction: Makes stomach upset/irritated.   Sulfonamide Derivatives Itching and Rash    History of Present Illness    Alexis [name] is a PMH of HTN, CAD, paroxysmal atrial fibrillation, acute on chronic  diastolic CHF, OSA, GERD, type 2 diabetes, anxiety, fibromyalgia, and anemia.  Her PMH also includes PVCs and PACs.  She wore a cardiac event monitor in 2018 showed short runs of SVT.  Her coronary calcium  scoring showed mild two-vessel coronary disease with plaque in her LAD and RCA.  Her coronary calcium  score was 90.  She had a repeat sleep study which showed an AHI of 8 per 24 hours.    She was seen by Alexis Satterfield, DNP on 05/10/2022.  During that time she reported worsening shortness of breath over the previous 2 weeks.  She noted decreased energy and weight gain.  She had followed up with her PCP who had informed her that her liver enzymes were rising.  She was taken off of atorvastatin .  She reported that she had not been using her CPAP.  Her husband reported that she does snore loudly.  She reported being exhausted during the day.  She was taking acetaminophen  for chronic pain.  She was admitted to the hospital on 06/04/2022 and discharged on 06/06/2022.  She presented with exertional shortness of breath, fatigue, and orthopnea.  She indicated that her fatigue and shortness of breath had worsened over the previous week.  She denied chest discomfort.  She also noted bilateral lower extremity edema and abdominal swelling.  Her BMP showed a bit of an elevated glucose of 200 her CBC showed a hemoglobin of 11.8.  Her BNP was elevated at 1203.  Her EKG showed sinus bradycardia with left anterior fascicular block 55 bpm.  Her oxygen saturation was normal on room air.  She had no increased work of breathing at rest.  She was noted to have lower extremity swelling that extended to her knees.  She received IV diuresis and was noted to have good urine output.  She had progressive improvement with her symptoms.  She was able to ambulate without any shortness of breath.  She had resolution of her hypertension.  She was discharged in stable condition.    06/07/2022 she presented for follow-up evaluation, stated  she was feeling better since being discharged from the hospital.  She had been avoiding salt.  She did notice that with elevated  Lasix  she was breathing better and had better mobility.  We reviewed the importance of daily weights and lower extremity support stockings.  She expressed understanding.  We reviewed her  hospitalization.  We also reviewed proper way to measure blood pressure.  I continued her  furosemide , gave her a weight log, and asked her to increase her physical activity as tolerated.   She continue to follow-up with cardiology.  She was seen by Lamarr Satterfield, DNP on 03/14/23.  Her hospitalization for herpes zoster and encephalopathy were reviewed.  She had been off of diuretics due to renal insufficiency.  She received 40 mg of Lasix  to use as needed during her prior clinic visit.  She was continued on Eliquis  for her paroxysmal atrial fibrillation.  She was without cardiac complaints.  She discussed her shingles infection that covered her scalp and intruded into  her right eye.  She reported compliance with her medications and anticoagulation.  She denied bleeding issues.  Her main issue was recovery from shingles.  She continues to follow with ophthalmology for eye exams.  She presents to the clinic today for follow-up evaluation and states she can use to recover after her left hip replacement.  She does report 1 episode of chest pain she felt in the evening.  She noted that the pain went through her back but she attributed this to a panic attack.  She ignored the pain and went to bed.  It was gone the next day and did not recur.  She has been doing physical therapy every other day and been doing activities such as walking down her driveway and yard work.  She denies chest pain with these activities.  Initially her blood pressure today is 143/73 and on recheck is 132/68.  She notes that her brother had CVA and is recovering.  He is back to playing golf.  She notes that she does not need  refills with her medications.  I will have her continue heart healthy low-sodium diet, increase physical activity as tolerated, continue her medication regimen and plan follow-up in 9 to 12 months.  Today's she denies chest pain, shortness of breath, lower extremity edema, fatigue, palpitations, melena, hematuria, hemoptysis, diaphoresis, weakness, presyncope, syncope, orthopnea, and PND.   Home Medications    Prior to Admission medications   Medication Sig Start Date End Date Taking? Authorizing Provider  acetaminophen  (TYLENOL ) 325 MG tablet Take 2 tablets (650 mg total) by mouth every 6 (six) hours as needed for mild pain or moderate pain. Patient taking differently: Take 325 mg by mouth every 6 (six) hours as needed for mild pain, moderate pain or headache. 12/12/20   Dickie Begun, MD  ACIPHEX  20 MG tablet Take 20 mg by mouth in the morning and at bedtime. 05/29/22   [provider]  ALPRAZolam  (XANAX ) 0.5 MG tablet Take 0.5 mg by mouth at bedtime as needed.    [provider]  amiodarone  (PACERONE ) 200 MG tablet Take 0.5 tablets (100 mg total) by mouth daily. Patient not taking: Reported on 06/05/2022 11/02/21   Mona Alexis BROCKS, MD  apixaban  (ELIQUIS ) 5 MG TABS tablet Take 1 tablet (5 mg total) by mouth 2 (two) times daily. 07/31/21   Akula, Vijaya, MD  b complex vitamins tablet Take 1 tablet by mouth at bedtime.    [provider]  CALCIUM  PO Take 1 tablet by mouth at bedtime.    [provider]  FARXIGA  10 MG TABS tablet Take 10 mg by mouth in the morning. 09/10/19   [provider]  fish oil-omega-3 fatty acids 1000 MG capsule Take 1 g by mouth at bedtime.     [provider]  furosemide  (LASIX ) 20 MG tablet Take 1 tablet (20 mg total) by mouth 2 (two) times daily. 06/05/22 07/05/22  Melvenia Motto, MD  insulin  lispro (HUMALOG) 100 UNIT/ML KwikPen Inject 8 Units into the skin 3 (three) times daily.    [provider]  metoprolol   succinate (TOPROL -XL) 50 MG 24 hr tablet Take 1 tablet (50 mg total) by mouth daily. Take with or immediately following a meal. Patient taking differently: Take 100 mg by mouth in the morning and at bedtime. Take with or immediately following a meal. 07/29/21   Cherlyn Labella, MD  pantoprazole  (PROTONIX ) 40 MG tablet Take 1 tablet (40 mg total) by mouth 2 (two) times daily.  Patient taking differently: Take 40 mg by mouth daily. 07/29/21   Akula, Vijaya, MD  pregabalin  (LYRICA ) 50 MG capsule Take 50 mg by mouth 2 (two) times daily.    [provider]  spironolactone  (ALDACTONE ) 25 MG tablet Take 0.5 tablets (12.5 mg total) by mouth daily. Patient not taking: Reported on 06/05/2022 08/20/21   Patsy Lenis, MD  TRESIBA FLEXTOUCH 100 UNIT/ML FlexTouch Pen Inject 18 Units into the skin at bedtime. 09/29/19   [provider]  TRULICITY  1.5 MG/0.5ML SOPN Inject 1.5 mg into the skin every Sunday. 05/07/21   [provider]  valsartan  (DIOVAN ) 160 MG tablet Take 1 tablet (160 mg total) by mouth daily. 08/20/21   Patsy Lenis, MD  venlafaxine  XR (EFFEXOR -XR) 150 MG 24 hr capsule Take 150 mg by mouth 2 (two) times daily.    [provider]  zaleplon (SONATA) 5 MG capsule Take 5 mg by mouth at bedtime.    [provider]    Family History    Family History  Problem Relation Age of Onset   Colon polyps Father    Heart disease Father    Breast cancer Sister    COPD Brother    Irritable bowel syndrome Daughter    Ovarian cancer Maternal Aunt    Stomach cancer Maternal Aunt    Diabetes Maternal Aunt    Colon cancer Maternal Uncle    Heart disease Maternal Uncle    Heart disease Other        Grandparents   Esophageal cancer Neg Hx    Pancreatic cancer Neg Hx    She indicated that her mother is alive. She indicated that her father is alive. She indicated that her sister is alive. She indicated that the status of her brother is unknown. She indicated that her  maternal grandmother is deceased. She indicated that her maternal grandfather is deceased. She indicated that her paternal grandmother is deceased. She indicated that her paternal grandfather is deceased. She indicated that two of her three daughters are alive. She indicated that the status of her neg hx is unknown. She indicated that the status of her other is unknown.  Social History    Social History   Socioeconomic History   Marital status: Married    Spouse name: Suzane Vanderweide   Number of children: 2   Years of education: Not on file   Highest education level: Some college, no degree  Occupational History   Occupation: Retired    Associate Professor: RETIRED  Tobacco Use   Smoking status: Never   Smokeless tobacco: Never  Vaping Use   Vaping status: Never Used  Substance and Sexual Activity   Alcohol use: No   Drug use: No   Sexual activity: Not Currently  Other Topics Concern   Not on file  Social History Narrative   Daily Caffeine, Coke   Social Drivers of Health   Financial Resource Strain: Low Risk  (04/27/2021)   Overall Financial Resource Strain (CARDIA)    Difficulty of Paying Living Expenses: Not very hard  Food Insecurity: No Food Insecurity (11/28/2022)   Hunger Vital Sign    Worried About Running Out of Food in the Last Year: Never true    Ran Out of Food in the Last Year: Never true  Transportation Needs: No Transportation Needs (11/28/2022)   PRAPARE - Administrator, Civil Service (Medical): No    Lack of Transportation (Non-Medical): No  Physical Activity: Not on file  Stress:  Not on file  Social Connections: Not on file  Intimate Partner Violence: Not At Risk (11/28/2022)   Humiliation, Afraid, Rape, and Kick questionnaire    Fear of Current or Ex-Partner: No    Emotionally Abused: No    Physically Abused: No    Sexually Abused: No     Review of Systems    General:  No chills, fever, night sweats or weight changes.  Cardiovascular:  No chest  pain, dyspnea on exertion, edema, orthopnea, palpitations, paroxysmal nocturnal dyspnea. Dermatological: No rash, lesions/masses Respiratory: No cough, dyspnea Urologic: No hematuria, dysuria Abdominal:   No nausea, vomiting, diarrhea, bright red blood per rectum, melena, or hematemesis Neurologic:  No visual changes, wkns, changes in mental status. All other systems reviewed and are otherwise negative except as noted above.  Physical Exam    VS:  BP 132/68   Pulse 64   Ht 5' 4 (1.626 m)   Wt 179 lb (81.2 kg)   SpO2 95%   BMI 30.73 kg/m  , BMI Body mass index is 30.73 kg/m. GEN: Well nourished, well developed, in no acute distress. HEENT: normal. Neck: Supple, no JVD, carotid bruits, or masses. Cardiac: RRR, no murmurs, rubs, or gallops. No clubbing, cyanosis, generalized bilateral lower extremity edema.  Radials/DP/PT 2+ and equal bilaterally.  Respiratory:  Respirations regular and unlabored, clear to auscultation bilaterally. GI: Soft, nontender, nondistended, BS + x 4. MS: no deformity or atrophy. Skin: warm and dry, no rash. Neuro:  Strength and sensation are intact. Psych: Normal affect.  Accessory Clinical Findings    Recent Labs: 02/20/2024: ALT 23; BUN 21; Creatinine, Ser 1.23; Hemoglobin 12.9; Platelets 223.0; Potassium 3.7; Sodium 141   Recent Lipid Panel    Component Value Date/Time   CHOL 162 11/13/2022 0946   TRIG 130 11/13/2022 0946   HDL 66 11/13/2022 0946   CHOLHDL 2.5 11/13/2022 0946   CHOLHDL 4.0 07/25/2021 0237   VLDL 17 07/25/2021 0237   LDLCALC 74 11/13/2022 0946         ECG personally reviewed by me today- EKG Interpretation Date/Time:  Tuesday April 06 2024 08:14:26 EDT Ventricular Rate:  63 PR Interval:  170 QRS Duration:  122 QT Interval:  460 QTC Calculation: 470 R Axis:   -66  Text Interpretation: Normal sinus rhythm Left axis deviation Left ventricular hypertrophy with QRS widening ( R in aVL , Cornell product , Romhilt-Estes )  Confirmed by Emelia Hazy [phone]) on 04/06/2024 8:16:11 AM   Echocardiogram 07/25/2021  1. Left ventricular ejection fraction, by estimation, is 65 to 70%. The  left ventricle has normal function. The left ventricle has no regional  wall motion abnormalities. Left ventricular diastolic parameters are  consistent with Grade II diastolic  dysfunction (pseudonormalization).   2. Right ventricular systolic function is normal. The right ventricular  size is normal.   3. The mitral valve is degenerative. No evidence of mitral valve  regurgitation. No evidence of mitral stenosis. Moderate mitral annular  calcification.   4. The aortic valve is tricuspid. Aortic valve regurgitation is not  visualized. Aortic valve sclerosis is present, with no evidence of aortic  valve stenosis.   5. The inferior vena cava is normal in size with <50% respiratory  variability, suggesting right atrial pressure of 8 mmHg.    VAS US  AAA Duplex 03/22/2022 Abdominal Aorta: No evidence of an abdominal aortic aneurysm was  visualized. The largest aortic measurement is 2.3 cm.  Stenosis:  Patent bilateral common and external iliac arteries  without evidence of  stenosis.    Coronary CTA 11/21/2020 1.  Diffuse nonobstructive CAD, CADRADS = 2.   2. Coronary calcium  score of 469. This was 93rd percentile for age and sex matched control.   3. Normal coronary origin with right dominance.   4.  Aortic atherosclerosis   5.  Small PFO without evidence of shunting.   6.  Mitral annular calcification.  Assessment & Plan   1.  Chronic diastolic CHF-weight today 179 lbs. denies increased DOE or activity intolerance.  With bilateral lower extremity generalized edema, nonpitting. Continue furosemide , metoprolol ,  valsartan  Farxiga  Heart healthy low-sodium diet-salty 6 reviewed Increase physical activity as tolerated Daily weights-contact office with a weight increase of 2 to 3 pounds overnight or 5 pounds in 1  week. Elevate lower extremities when not active Lower extremity support stockings-offered support stocking sheet.  Coronary artery disease-denies exertional chest discomfort.  Coronary CTA showed coronary calcium  score of 469.  Details above. Continue metoprolol , atorvastatin , amlodipine , omega-3 fatty acids  Essential hypertension-BP today 132/68 Maintain blood pressure log Continue metoprolol ,  valsartan , amlodipine   Paroxysmal atrial fibrillation-EKG today shows sinus rhythm.  Reports compliance with apixaban  and denies bleeding issues.  Denies episodes of accelerated or irregular heartbeat. Continue to follow, apixaban , amiodarone  Avoid triggers caffeine, chocolate, EtOH, dehydration etc.  Disposition: Follow-up with Dr. Mona or me in 9-12 months.   Josefa HERO. Kace Hartje NP-C     04/06/2024, 8:32 AM [facility] [address] Office [phone] Fax [phone]  Notice: This dictation was prepared with Dragon dictation along with smaller phrase technology. Any transcriptional errors that result from this process are unintentional and may not be corrected upon review.  I spent 14 minutes examining this patient, reviewing medications, and using patient centered shared decision making involving her cardiac care.  Prior to her visit I spent greater than 20 minutes reviewing her past medical history,  medications, and prior cardiac tests.

## 2024-04-05 NOTE — Telephone Encounter (Signed)
 Patient with diagnosis of atrial fibrillation on Eliquis  for anticoagulation.    What type of surgery is being performed?     colonoscopy  When is this surgery scheduled?     05/04/24    CHA2DS2-VASc Score = 5   This indicates a 7.2% annual risk of stroke. The patient's score is based upon: CHF History: 1 HTN History: 0 Diabetes History: 1 Stroke History: 0 Vascular Disease History: 1 Age Score: 1 Gender Score: 1   CrCl 53 Platelet count 223  Patient has not had an Afib/aflutter ablation within the last 3 months or DCCV within the last 30 days  Per office protocol, patient can hold Eliquis  for 2 days prior to procedure.   Patient will not need bridging with Lovenox  (enoxaparin ) around procedure.  **This guidance is not considered finalized until pre-operative APP has relayed final recommendations.**

## 2024-04-06 ENCOUNTER — Ambulatory Visit: Attending: General Practice | Admitting: General Practice

## 2024-04-06 ENCOUNTER — Encounter: Payer: Self-pay | Admitting: General Practice

## 2024-04-06 VITALS — BP 132/68 | HR 64 | Ht 64.0 in | Wt 179.0 lb

## 2024-04-06 DIAGNOSIS — I5032 Chronic diastolic (congestive) heart failure: Secondary | ICD-10-CM

## 2024-04-06 DIAGNOSIS — I10 Essential (primary) hypertension: Secondary | ICD-10-CM

## 2024-04-06 DIAGNOSIS — I480 Paroxysmal atrial fibrillation: Secondary | ICD-10-CM

## 2024-04-06 DIAGNOSIS — I2510 Atherosclerotic heart disease of native coronary artery without angina pectoris: Secondary | ICD-10-CM

## 2024-04-06 NOTE — Patient Instructions (Signed)
 Medication Instructions:  Your physician recommends that you continue on your current medications as directed. Please refer to the Current Medication list given to you today.  *If you need a refill on your cardiac medications before your next appointment, please call your pharmacy*  Lab Work: NONE If you have labs (blood work) drawn today and your tests are completely normal, you will receive your results only by: MyChart Message (if you have MyChart) OR A paper copy in the mail If you have any lab test that is abnormal or we need to change your treatment, we will call you to review the results.  Testing/Procedures: NONE  Follow-Up: At [facility], you and your health needs are our priority.  As part of our continuing mission to provide you with exceptional heart care, our providers are all part of one team.  This team includes your primary Cardiologist (physician) and Advanced Practice Providers or APPs (Physician Assistants and Nurse Practitioners) who all work together to provide you with the care you need, when you need it.  Your next appointment:   9-12 month(s)  Provider:   Vinie JAYSON Maxcy, MD   We recommend signing up for the patient portal called MyChart.  Sign up information is provided on this After Visit Summary.  MyChart is used to connect with patients for Virtual Visits (Telemedicine).  Patients are able to view lab/test results, encounter notes, upcoming appointments, etc.  Non-urgent messages can be sent to your provider as well.   To learn more about what you can do with MyChart, go to [email or web address].   Other Instructions Exercise regularly as told by your doctor. Make sure to weight daily and keep a weight log.   Moderate-intensity exercise is any activity that gets you moving enough to burn at least three times more energy (calories) than if you were sitting. Examples of moderate exercise include: Walking a mile in 15 minutes. Doing light yard  work. Biking at an easy pace. Most people should get at least 150 minutes of moderate-intensity exercise a week to maintain their body weight.  Increase your water intake: Maintain hydration

## 2024-04-22 ENCOUNTER — Telehealth: Payer: Self-pay | Admitting: Gastroenterology

## 2024-04-26 NOTE — Telephone Encounter (Signed)
 Please update clearance request.

## 2024-04-27 ENCOUNTER — Encounter: Payer: Self-pay | Admitting: Gastroenterology

## 2024-04-27 NOTE — Telephone Encounter (Signed)
Patient informed to hold Eliquis. 

## 2024-04-27 NOTE — Telephone Encounter (Signed)
 Left message for patient to call office.

## 2024-04-27 NOTE — Telephone Encounter (Signed)
   Patient Name: Alexis [name]  DOB: 14-Jun-1951 MRN: [id number]  Primary Cardiologist: Vinie JAYSON Maxcy, MD  Clinical pharmacists have reviewed the patient's past medical history, labs, and current medications as part of preoperative protocol coverage. The following recommendations have been made:   Patient with diagnosis of atrial fibrillation on Eliquis  for anticoagulation.     What type of surgery is being performed?     colonoscopy  When is this surgery scheduled?     05/04/24      CHA2DS2-VASc Score = 5  This indicates a 7.2% annual risk of stroke. The patient's score is based upon: CHF History: 1 HTN History: 0 Diabetes History: 1 Stroke History: 0 Vascular Disease History: 1 Age Score: 1 Gender Score: 1     CrCl 53 Platelet count 223   Patient has not had an Afib/aflutter ablation within the last 3 months or DCCV within the last 30 days   Per office protocol, patient can hold Eliquis  for 2 days prior to procedure.   Patient will not need bridging with Lovenox  (enoxaparin ) around procedure. Please resume Eliquis  as soon as possible postprocedure, at the discretion of the surgeon.     I will route this recommendation to the requesting party via Epic fax function and remove from pre-op pool.  Please call with questions.  Damien JAYSON Braver, NP 04/27/2024, 9:06 AM

## 2024-05-03 ENCOUNTER — Telehealth: Payer: Self-pay | Admitting: Gastroenterology

## 2024-05-03 NOTE — Telephone Encounter (Signed)
 Good Morning Dr. Legrand,   I received a call from this patient stating that she has a death in the family and will not be able to do her colonoscopy scheduled for tomorrow. Patient procedure has been cancelled. Patient will call back and reschedule. Please advise.   Thank you.

## 2024-05-03 NOTE — Telephone Encounter (Signed)
 Thank you for the note.  If she held her Eliquis  for the procedure, then of course please have her resume it.  We will wait to hear from her when she is ready to reschedule.  H Danis

## 2024-05-04 ENCOUNTER — Encounter: Admitting: Gastroenterology

## 2024-05-09 ENCOUNTER — Other Ambulatory Visit: Payer: Self-pay | Admitting: Internal Medicine

## 2024-05-13 ENCOUNTER — Ambulatory Visit: Admitting: Neurology

## 2024-05-24 ENCOUNTER — Ambulatory Visit: Admitting: Neurology

## 2024-05-25 ENCOUNTER — Other Ambulatory Visit: Payer: Self-pay | Admitting: Internal Medicine

## 2024-05-25 DIAGNOSIS — I480 Paroxysmal atrial fibrillation: Secondary | ICD-10-CM

## 2024-05-28 NOTE — Telephone Encounter (Signed)
 Prescription refill request for Eliquis  received. Indication:afib Last office visit:8/25 Scr:1.20  9/25 Age: 73 Weight:81.2  kg  Prescription refilled

## 2024-07-16 ENCOUNTER — Other Ambulatory Visit (HOSPITAL_COMMUNITY): Payer: Self-pay | Admitting: Nephrology

## 2024-07-16 DIAGNOSIS — R809 Proteinuria, unspecified: Secondary | ICD-10-CM

## 2024-07-16 DIAGNOSIS — N390 Urinary tract infection, site not specified: Secondary | ICD-10-CM

## 2024-07-20 ENCOUNTER — Ambulatory Visit (HOSPITAL_COMMUNITY)

## 2024-07-27 ENCOUNTER — Telehealth: Payer: Self-pay

## 2024-07-27 ENCOUNTER — Ambulatory Visit (INDEPENDENT_AMBULATORY_CARE_PROVIDER_SITE_OTHER): Admitting: Gastroenterology

## 2024-07-27 ENCOUNTER — Encounter: Payer: Self-pay | Admitting: Gastroenterology

## 2024-07-27 VITALS — BP 126/66 | HR 60 | Ht 64.0 in | Wt 174.0 lb

## 2024-07-27 DIAGNOSIS — Z860100 Personal history of colon polyps, unspecified: Secondary | ICD-10-CM | POA: Diagnosis not present

## 2024-07-27 DIAGNOSIS — R110 Nausea: Secondary | ICD-10-CM

## 2024-07-27 DIAGNOSIS — R194 Change in bowel habit: Secondary | ICD-10-CM

## 2024-07-27 DIAGNOSIS — R198 Other specified symptoms and signs involving the digestive system and abdomen: Secondary | ICD-10-CM

## 2024-07-27 DIAGNOSIS — R140 Abdominal distension (gaseous): Secondary | ICD-10-CM

## 2024-07-27 DIAGNOSIS — Z7902 Long term (current) use of antithrombotics/antiplatelets: Secondary | ICD-10-CM

## 2024-07-27 MED ORDER — METOCLOPRAMIDE HCL 5 MG PO TABS: 5.0000 mg | ORAL_TABLET | Freq: Two times a day (BID) | ORAL | 0 refills | Status: AC | PRN

## 2024-07-27 MED ORDER — METRONIDAZOLE 250 MG PO TABS: 250.0000 mg | ORAL_TABLET | Freq: Three times a day (TID) | ORAL | 0 refills | Status: AC

## 2024-07-27 NOTE — Telephone Encounter (Signed)
 Hello Prescriber!  We are in the process of submitting a prior authorization for your patient for Nurtec. We are reaching out for clinical guidance for the following information to complete the request: Plan requiring documentation of clinical benefit since starting therapy.   Thank you! Pharmacy Team

## 2024-07-27 NOTE — Telephone Encounter (Signed)
 Pharmacy Patient Advocate Encounter   Received notification from CoverMyMeds that prior authorization for Nurtec is required/requested.   Insurance verification completed.   The patient is insured through [facility] [employer] [facility].   Per test claim: PA required; PA started via CoverMyMeds. KEY [id number] . Waiting for clinical questions to populate.

## 2024-07-27 NOTE — Progress Notes (Signed)
 [facility] Consult Note:  History: Alexis [name] 07/27/2024  Referring provider: Royden Ronal Czar, FNP  Reason for consult/chief complaint: Nausea (Pt states she is nauseous this morning , pt states she is having a low grade fever) and Constipation (Pt state she is passing small hard balls)   Subjective  Prior history:  Last clinic visit with Dr. Legrand March 2023 for chronic idiopathic constipation with a possible overlay of SIBO, having had some symptom improvement after an empiric course of metronidazole  in early 2023. IDA believed to be multifactorial from chronic blood loss due to inflamed gastric polyp removed during inpatient EGD December 2022 as well as from CKD  Clinic visit with APP 02/20/2024 regarding MASLD and 5 subcentimeter adenomatous polyps found last colonoscopy December 2022 (with biopsies negative for microscopic colitis at that time as she was describing intermittent diarrhea) Was scheduled for surveillance colonoscopy September 2025 and canceled due to family emergency. Phone note 03/30/2024 indicates her cardiologist was agreeable to a 2-day Eliquis  hold. Diastolic heart failure and A-fib stable last cardiology clinic visit 04/06/2024 Discussed the use of AI scribe software for clinical note transcription with the patient, who gave verbal consent to proceed.  History of Present Illness Alexis [name] is a 73 year old female with a history of polyps who presents with nausea, constipation, and abdominal distension.  Nausea and abdominal discomfort - Significant nausea with frequent episodes throughout the day - Abdominal discomfort and noticeable distension - Appetite markedly reduced, but forces herself to eat small amounts despite nausea  Constipation - Infrequent bowel movements - Stools described as small, black balls - Minimal relief with single use of milk of magnesia - Avoids other over-the-counter or prescription medications  for bowel movements unless advised  Altered bowel habits - Previously experienced urgency and loose stools immediately after eating - This is still occurring and she finds it distressing  She says that the small pellet-like stools that she passes are sometimes black. Donneisha has also been struggling with loss of a few family members over the last several months, most notably her sister in September.    ROS:  Review of Systems  Constitutional:  Negative for appetite change and unexpected weight change.  HENT:  Negative for mouth sores and voice change.   Eyes:  Negative for pain and redness.  Respiratory:  Negative for cough and shortness of breath.   Cardiovascular:  Negative for chest pain and palpitations.  Genitourinary:  Negative for dysuria and hematuria.  Musculoskeletal:  Positive for arthralgias. Negative for myalgias.  Skin:  Negative for pallor and rash.  Neurological:  Negative for weakness and headaches.  Hematological:  Negative for adenopathy.     Past Medical History: Past Medical History:  Diagnosis Date   Anxiety    Arthritis    knees (05/25/2014)   Chronic back pain    Depression    Diabetes mellitus type II    Diabetic peripheral neuropathy (HCC) 10/20/2018   Diverticulitis    Fatty liver    Fibromyalgia    Gastric polyp    hyperplastic   Gastroparesis    recently dx'd (05/25/2014)   GERD (gastroesophageal reflux disease)    H/O hiatal hernia    Hx of gastritis    Hyperlipidemia    Hypertension    Irritable bowel syndrome (IBS)    Memory difficulties 09/18/2017   Migraine without aura, without mention of intractable migraine without mention of status migrainosus    related to allergies; have  them in the spring and fall (05/25/2014)   Obesity    Sleep apnea    wore mask; took it off in my sleep; quit wearing it (05/25/2014)   Uterine cancer (HCC) dx'd 2000   surg only   Vasculitis    irritates my legs   Wears glasses    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     Past Surgical History: Past Surgical History:  Procedure Laterality Date   ANTERIOR CERVICAL DECOMPRESSION/DISCECTOMY FUSION 4 LEVELS N/A 05/07/2018   Procedure: ANTERIOR CERVICAL DECOMPRESSION FUSION, CERVICAL 4-5,CERVICAL 5-6, CERVICAL 6-7 WITH INSTRUMENTATION AND ALLOGRAFT;  Surgeon: Beuford Anes, MD;  Location: [facility] OR;  Service: Orthopedics;  Laterality: N/A;   APPENDECTOMY  ~ 1967   BIOPSY  07/27/2021   Procedure: BIOPSY;  Surgeon: Leigh Elspeth SQUIBB, MD;  Location: [facility] ENDOSCOPY;  Service: Gastroenterology;;   BREAST CYST EXCISION Right 1990   COLONOSCOPY     COLONOSCOPY WITH PROPOFOL  N/A 07/27/2021   Procedure: COLONOSCOPY WITH PROPOFOL ;  Surgeon: Leigh Elspeth SQUIBB, MD;  Location: [facility] ENDOSCOPY;  Service: Gastroenterology;  Laterality: N/A;   ESOPHAGOGASTRODUODENOSCOPY (EGD) WITH PROPOFOL  N/A 07/27/2021   Procedure: ESOPHAGOGASTRODUODENOSCOPY (EGD) WITH PROPOFOL ;  Surgeon: Leigh Elspeth SQUIBB, MD;  Location: [facility] ENDOSCOPY;  Service: Gastroenterology;  Laterality: N/A;   HEMOSTASIS CLIP PLACEMENT  07/27/2021   Procedure: HEMOSTASIS CLIP PLACEMENT;  Surgeon: Leigh Elspeth SQUIBB, MD;  Location: [facility] ENDOSCOPY;  Service: Gastroenterology;;   LAPAROSCOPIC CHOLECYSTECTOMY  1990   LEFT HEART CATHETERIZATION WITH CORONARY ANGIOGRAM N/A 05/26/2014   Procedure: LEFT HEART CATHETERIZATION WITH CORONARY ANGIOGRAM;  Surgeon: Debby DELENA Sor, MD;  Location: [facility] CATH LAB;  Service: Cardiovascular;  Laterality: N/A;   POLYPECTOMY  07/27/2021   Procedure: POLYPECTOMY;  Surgeon: Leigh Elspeth SQUIBB, MD;  Location: [facility] ENDOSCOPY;  Service: Gastroenterology;;   MATIAS  07/27/2021   Procedure: MATIAS;  Surgeon: Leigh Elspeth SQUIBB, MD;  Location: [facility] ENDOSCOPY;  Service: Gastroenterology;;   SINUS SURGERY WITH INSTATRAK  2000   TOTAL HIP ARTHROPLASTY Left 12/05/2020   Procedure: TOTAL HIP ARTHROPLASTY ANTERIOR APPROACH;  Surgeon: Fidel Rogue, MD;  Location:  [facility] OR;  Service: Orthopedics;  Laterality: Left;   TUBAL LIGATION  ~ 1982   VAGINAL HYSTERECTOMY  2000     Family History: Family History  Problem Relation Age of Onset   Colon polyps Father    Heart disease Father    Breast cancer Sister    COPD Brother    Irritable bowel syndrome Daughter    Ovarian cancer Maternal Aunt    Stomach cancer Maternal Aunt    Diabetes Maternal Aunt    Colon cancer Maternal Uncle    Heart disease Maternal Uncle    Heart disease Other        Grandparents   Esophageal cancer Neg Hx    Pancreatic cancer Neg Hx     Social History: Social History   Socioeconomic History   Marital status: Married    Spouse name: Kalesha Irving   Number of children: 2   Years of education: Not on file   Highest education level: Some college, no degree  Occupational History   Occupation: Retired    Associate Professor: RETIRED  Tobacco Use   Smoking status: Never   Smokeless tobacco: Never  Vaping Use   Vaping status: Never Used  Substance and Sexual Activity   Alcohol use: No   Drug use: No   Sexual activity: Not Currently  Other Topics Concern   Not on file  Social History Narrative  Daily Caffeine, Coke   Social Drivers of Health   Tobacco Use: Low Risk (04/06/2024)   Patient History    Smoking Tobacco Use: Never    Smokeless Tobacco Use: Never    Passive Exposure: Not on file  Financial Resource Strain: Not on file  Food Insecurity: No Food Insecurity (11/28/2022)   Hunger Vital Sign    Worried About Running Out of Food in the Last Year: Never true    Ran Out of Food in the Last Year: Never true  Transportation Needs: No Transportation Needs (11/28/2022)   PRAPARE - Administrator, Civil Service (Medical): No    Lack of Transportation (Non-Medical): No  Physical Activity: Not on file  Stress: Not on file  Social Connections: Not on file  Depression (PHQ2-9): Low Risk (12/09/2022)   Depression (PHQ2-9)    PHQ-2 Score: 1  Alcohol Screen:  Not on file  Housing: Low Risk (11/28/2022)   Housing    Last Housing Risk Score: 0  Utilities: Not At Risk (11/28/2022)   AHC Utilities    Threatened with loss of utilities: No  Health Literacy: Not on file    Allergies: Allergies[1]  Outpatient Meds: Current Outpatient Medications  Medication Sig Dispense Refill   acetaminophen  (TYLENOL ) 325 MG tablet Take 2 tablets (650 mg total) by mouth every 6 (six) hours as needed for mild pain or moderate pain. (Patient taking differently: Take 325 mg by mouth every 6 (six) hours as needed for mild pain (pain score 1-3), moderate pain (pain score 4-6) or headache.)     ACIPHEX  20 MG tablet Take 20 mg by mouth in the morning and at bedtime.     ALPRAZolam  (XANAX ) 0.5 MG tablet Take 0.5 mg by mouth at bedtime.     amLODipine  (NORVASC ) 5 MG tablet TAKE 1 TABLET (5 MG TOTAL) BY MOUTH DAILY. 90 tablet 3   atorvastatin  (LIPITOR) 20 MG tablet TAKE 1 TABLET BY MOUTH EVERY DAY 90 tablet 1   b complex vitamins tablet Take 1 tablet by mouth at bedtime.     Calcium  Carb-Cholecalciferol (CALCIUM  1000 + D PO) Take by mouth. Pt says takes half of tab     ELIQUIS  5 MG TABS tablet TAKE 1 TABLET BY MOUTH TWICE A DAY 180 tablet 1   FARXIGA  10 MG TABS tablet Take 10 mg by mouth in the morning.     ferrous sulfate  325 (65 FE) MG tablet Take 1 tablet (325 mg total) by mouth every other day. 30 tablet 3   fish oil-omega-3 fatty acids 1000 MG capsule Take 1 g by mouth at bedtime.     furosemide  (LASIX ) 40 MG tablet TAKE 1 TABLET BY MOUTH EVERY DAY AS NEEDED 90 tablet 3   insulin  lispro (HUMALOG) 100 UNIT/ML KwikPen Inject 14 Units into the skin 3 (three) times daily.     metoprolol  succinate (TOPROL -XL) 50 MG 24 hr tablet Take 1 tablet (50 mg total) by mouth daily. Take with or immediately following a meal. (Patient taking differently: Take 50 mg by mouth 2 (two) times daily. Take with or immediately following a meal. Pt taking 2 in the morning and 2 at night) 30 tablet 2    OZEMPIC, 2 MG/DOSE, 8 MG/3ML SOPN Inject 1 Pen into the skin every 7 (seven) days.     pantoprazole  (PROTONIX ) 40 MG tablet Take 1 tablet (40 mg total) by mouth 2 (two) times daily. 60 tablet 2   pregabalin  (LYRICA ) 75 MG capsule Take  1 capsule (75 mg total) by mouth 3 (three) times daily. 90 capsule 5   Rimegepant Sulfate (NURTEC) 75 MG TBDP Take 1 tab at onset of migraine.  May repeat in 2 hrs, if needed.  Max dose: 2 tabs/day. This is a 30 day prescription. 12 tablet 11   TRESIBA FLEXTOUCH 100 UNIT/ML FlexTouch Pen Inject 25 Units into the skin at bedtime.     valsartan  (DIOVAN ) 160 MG tablet Take 1 tablet (160 mg total) by mouth daily. 30 tablet 3   venlafaxine  XR (EFFEXOR -XR) 150 MG 24 hr capsule Take 150 mg by mouth 2 (two) times daily.     zaleplon (SONATA) 5 MG capsule Take 5 mg by mouth at bedtime.     No current facility-administered medications for this visit.      ___________________________________________________________________ Objective   Exam:  BP 126/66   Pulse 60   Ht 5' 4 (1.626 m)   Wt 174 lb (78.9 kg)   BMI 29.87 kg/m  Wt Readings from Last 3 Encounters:  07/27/24 174 lb (78.9 kg)  04/06/24 179 lb (81.2 kg)  02/20/24 177 lb 12.8 oz (80.6 kg)    General: Well-appearing, gets on exam table without assistance Eyes: sclera anicteric, no redness ENT: oral mucosa moist without lesions, no cervical or supraclavicular lymphadenopathy CV: Regular without appreciable murmur, no JVD, no peripheral edema Resp: clear to auscultation bilaterally, normal RR and effort noted GI: soft, + tenderness to light palpation of upper abdominal wall, with active bowel sounds of normal character and no bruit. No guarding or palpable organomegaly noted.  No distention noted Skin warm and dry, no rash or jaundice noted Neuro: awake, alert and oriented x 3. Normal gross motor function and fluent speech     Encounter Diagnoses  Name Primary?   Hx of colonic polyps Yes    Altered bowel habits    Nausea in adult    Tenesmus    Long term (current) use of antithrombotics/antiplatelets    Abdominal bloating     Assessment and Plan Assessment & Plan Chronic constipation and irregular bowel habits Chronic constipation with irregular bowel habits, most consistent with IBS-A, possibly due to diabetes-related motility disorder. Differential includes small intestinal bacterial overgrowth with risk factors of chronic constipation and diabetes. - EGD for evaluation of nausea - Prescribed metronidazole  for 10 days as empiric therapy for possible SIBO due to the reported symptoms and similarities to 2023 issues. - Instructed to stop Eliquis  two days before the procedure.  She was aware of the small but real risk of stroke during the brief hold of oral anticoagulation.  Suspected small intestinal bacterial overgrowth Symptoms suggestive of small intestinal bacterial overgrowth, possibly exacerbated by diabetes-related motility issues. - Prescribed metronidazole  for 10 days.  (250 mg 3 times daily)  History of colonic polyps Colonic polyps identified three years ago, due for follow-up colonoscopy. - Scheduled colonoscopy to evaluate for recurrence or new polyps. Suprep and 2 doses of Reglan  to help with the nausea during prep    Thank you for the courtesy of this consult.  Please call me with any questions or concerns.  Victory LITTIE Brand III  CC: Referring provider noted above     [1]  Allergies Allergen Reactions   Aspirin  Other (See Comments)    Reaction: Vasculitis per MD   Codeine Itching and Rash    Tolerated Norco 12/07/20 with no reports of rash/itching. Patient endorsed allergy >30 yr ago and hasn't had reaction since. Climb the walls  per pt   Naproxen Nausea Only    Reaction: Makes stomach upset/irritated.   Sulfonamide Derivatives Itching and Rash

## 2024-07-27 NOTE — Patient Instructions (Signed)
 We have sent the following medications to your pharmacy for you to pick up at your convenience:  Reglan  - take one tablet about 30 minutes before drinking each half of the prep  You have been scheduled for a colonoscopy. Please follow written instructions given to you at your visit today.   If you use inhalers (even only as needed), please bring them with you on the day of your procedure.  DO NOT TAKE 7 DAYS PRIOR TO TEST- Trulicity  (dulaglutide ) Ozempic, Wegovy (semaglutide) Mounjaro, Zepbound (tirzepatide) Bydureon Bcise (exanatide extended release)  DO NOT TAKE 1 DAY PRIOR TO YOUR TEST Rybelsus (semaglutide) Adlyxin (lixisenatide) Victoza (liraglutide) Byetta (exanatide) ___________________________________________________________________________

## 2024-07-29 ENCOUNTER — Ambulatory Visit (HOSPITAL_COMMUNITY): Admission: RE | Admit: 2024-07-29 | Discharge: 2024-07-29 | Attending: Nephrology | Admitting: Nephrology

## 2024-07-29 DIAGNOSIS — N390 Urinary tract infection, site not specified: Secondary | ICD-10-CM | POA: Insufficient documentation

## 2024-07-29 DIAGNOSIS — R809 Proteinuria, unspecified: Secondary | ICD-10-CM | POA: Insufficient documentation

## 2024-08-02 NOTE — Telephone Encounter (Signed)
 Left message to log on to my chart and read message and reply. So it can be forwarded to PA team.

## 2024-08-10 NOTE — Telephone Encounter (Signed)
 Left another message for patient to either call or reply to my chart message.

## 2024-08-31 ENCOUNTER — Encounter: Payer: Self-pay | Admitting: Gastroenterology

## 2024-08-31 ENCOUNTER — Ambulatory Visit: Admitting: Gastroenterology

## 2024-08-31 VITALS — BP 133/60 | HR 68 | Temp 97.7°F | Resp 16 | Ht 64.0 in | Wt 174.0 lb

## 2024-08-31 DIAGNOSIS — D120 Benign neoplasm of cecum: Secondary | ICD-10-CM

## 2024-08-31 DIAGNOSIS — R110 Nausea: Secondary | ICD-10-CM

## 2024-08-31 DIAGNOSIS — Z860101 Personal history of adenomatous and serrated colon polyps: Secondary | ICD-10-CM | POA: Diagnosis not present

## 2024-08-31 DIAGNOSIS — Z1211 Encounter for screening for malignant neoplasm of colon: Secondary | ICD-10-CM

## 2024-08-31 DIAGNOSIS — R197 Diarrhea, unspecified: Secondary | ICD-10-CM | POA: Diagnosis not present

## 2024-08-31 DIAGNOSIS — K3189 Other diseases of stomach and duodenum: Secondary | ICD-10-CM | POA: Diagnosis not present

## 2024-08-31 DIAGNOSIS — Z860100 Personal history of colon polyps, unspecified: Secondary | ICD-10-CM

## 2024-08-31 DIAGNOSIS — G8929 Other chronic pain: Secondary | ICD-10-CM

## 2024-08-31 DIAGNOSIS — D123 Benign neoplasm of transverse colon: Secondary | ICD-10-CM

## 2024-08-31 MED ORDER — DICYCLOMINE HCL 10 MG PO CAPS: 10.0000 mg | ORAL_CAPSULE | Freq: Two times a day (BID) | ORAL | 3 refills | Status: AC

## 2024-08-31 MED ORDER — SODIUM CHLORIDE 0.9 % IV SOLN: 500.0000 mL | Freq: Once | INTRAVENOUS | Status: AC

## 2024-08-31 NOTE — Progress Notes (Unsigned)
 Patient reports taking Eliquis  on 08/30/2024 in the morning. Dr. Rema made aware. Per Dr. Nandigam okay to proceed with procedure.

## 2024-08-31 NOTE — Op Note (Signed)
 [facility] Patient Name: Alexis [name] Procedure Date: 08/31/2024 2:27 PM MRN: [id number] Endoscopist: Gustav ALONSO Mcgee , MD, [id number] Age: 74 Referring MD:  Date of Birth: 27-Jan-1951 Gender: Female Account #: [id number] Procedure:                Colonoscopy Indications:              High risk colon cancer surveillance: Personal                            history of colonic polyps, High risk colon cancer                            surveillance: Personal history of multiple (3 or                            more) adenomas, High risk colon cancer                            surveillance: Personal history of adenoma less than                            10 mm in size, Incidental diarrhea noted Medicines:                Monitored Anesthesia Care Procedure:                Pre-Anesthesia Assessment:                           - Prior to the procedure, a History and Physical                            was performed, and patient medications and                            allergies were reviewed. The patient's tolerance of                            previous anesthesia was also reviewed. The risks                            and benefits of the procedure and the sedation                            options and risks were discussed with the patient.                            All questions were answered, and informed consent                            was obtained. Prior Anticoagulants: The patient                            last took Eliquis  (apixaban ) 1 day prior to the  procedure. ASA Grade Assessment: III - A patient                            with severe systemic disease. After reviewing the                            risks and benefits, the patient was deemed in                            satisfactory condition to undergo the procedure.                           After obtaining informed consent, the colonoscope                            was passed  under direct vision. Throughout the                            procedure, the patient's blood pressure, pulse, and                            oxygen saturations were monitored continuously. The                            Olympus Scope [id number] was introduced through the                            anus and advanced to the the cecum, identified by                            appendiceal orifice and ileocecal valve. The                            colonoscopy was performed without difficulty. The                            patient tolerated the procedure well. The quality                            of the bowel preparation was good. The ileocecal                            valve, appendiceal orifice, and rectum were                            photographed. Scope In: 3:33:31 PM Scope Out: 3:50:16 PM Scope Withdrawal Time: 0 hours 11 minutes 23 seconds  Total Procedure Duration: 0 hours 16 minutes 45 seconds  Findings:                 The perianal and digital rectal examinations were                            normal.  Two sessile polyps were found in the transverse                            colon and cecum. The polyps were 3 to 4 mm in size.                            These polyps were removed with a cold snare.                            Resection and retrieval were complete.                           Normal mucosa was found in the left colon and in                            the right colon. Biopsies for histology were taken                            with a cold forceps from the right colon and left                            colon for evaluation of microscopic colitis. Complications:            No immediate complications. Estimated Blood Loss:     Estimated blood loss was minimal. Impression:               - Two 3 to 4 mm polyps in the transverse colon and                            in the cecum, removed with a cold snare. Resected                            and  retrieved.                           - Normal mucosa in the left colon and in the right                            colon. Biopsied. Recommendation:           - Resume previous diet.                           - Continue present medications.                           - Await pathology results.                           - Repeat colonoscopy in 5 years for surveillance                            based on pathology results.                           -  Resume Eliquis  (apixaban ) at prior dose in 2                            days. Refer to managing physician for further                            adjustment of therapy.                           - Use Bentyl  (dicyclomine ) 10 mg PO BID 30 min AC.                            Rx for 30 days with 3 refills                           - Follow up with Dr Legrand in 6-8 weeks Alexis Gaylord V. Amandeep Nesmith, MD 08/31/2024 4:02:21 PM This report has been signed electronically.

## 2024-08-31 NOTE — Progress Notes (Unsigned)
 Sedate, gd SR, tolerated procedure well, VSS, report to RN

## 2024-08-31 NOTE — Patient Instructions (Addendum)
 Resume previous diet.  Biopsy results and additional recommendations will be sent via letter or MyChart.  Handouts provided on polyps.    Resume Eliquis  at prior dose in 2 days.  Prescription for Bentyl  sent to [facility].  Follow up appt. scheduled with Dr. Legrand on March 5th.  If you need to reschedule, please contact the office directly.    YOU HAD AN ENDOSCOPIC PROCEDURE TODAY AT THE [facility]:   Refer to the procedure report that was given to you for any specific questions about what was found during the examination.  If the procedure report does not answer your questions, please call your gastroenterologist to clarify.  If you requested that your care partner not be given the details of your procedure findings, then the procedure report has been included in a sealed envelope for you to review at your convenience later.  YOU SHOULD EXPECT: Some feelings of bloating in the abdomen. Passage of more gas than usual.  Walking can help get rid of the air that was put into your GI tract during the procedure and reduce the bloating. If you had a lower endoscopy (such as a colonoscopy or flexible sigmoidoscopy) you may notice spotting of blood in your stool or on the toilet paper. If you underwent a bowel prep for your procedure, you may not have a normal bowel movement for a few days.  Please Note:  You might notice some irritation and congestion in your nose or some drainage.  This is from the oxygen used during your procedure.  There is no need for concern and it should clear up in a day or so.  SYMPTOMS TO REPORT IMMEDIATELY:  Following lower endoscopy (colonoscopy or flexible sigmoidoscopy):  Excessive amounts of blood in the stool  Significant tenderness or worsening of abdominal pains  Swelling of the abdomen that is new, acute  Fever of 100F or higher  Following upper endoscopy (EGD)  Vomiting of blood or coffee ground material  New chest pain or pain under the shoulder  blades  Painful or persistently difficult swallowing  New shortness of breath  Fever of 100F or higher  Black, tarry-looking stools  For urgent or emergent issues, a gastroenterologist can be reached at any hour by calling [phone]. Do not use MyChart messaging for urgent concerns.    DIET:  We do recommend a small meal at first, but then you may proceed to your regular diet.  Drink plenty of fluids but you should avoid alcoholic beverages for 24 hours.  ACTIVITY:  You should plan to take it easy for the rest of today and you should NOT DRIVE or use heavy machinery until tomorrow (because of the sedation medicines used during the test).    FOLLOW UP: Our staff will call the number listed on your records the next business day following your procedure.  We will call around 7:15- 8:00 am to check on you and address any questions or concerns that you may have regarding the information given to you following your procedure. If we do not reach you, we will leave a message.     If any biopsies were taken you will be contacted by phone or by letter within the next 1-3 weeks.  Please call us  at [phone] if you have not heard about the biopsies in 3 weeks.    SIGNATURES/CONFIDENTIALITY: You and/or your care partner have signed paperwork which will be entered into your electronic medical record.  These signatures attest to the  fact that that the information above on your After Visit Summary has been reviewed and is understood.  Full responsibility of the confidentiality of this discharge information lies with you and/or your care-partner.

## 2024-08-31 NOTE — Progress Notes (Unsigned)
 [facility] History and Physical   Primary Care Physician:  Royden Ronal Czar, FNP   Reason for Procedure:  Nausea, epigastric pain, diarrhea, h/o colon polyps  Plan:    EGD and colonoscopy with possible interventions as needed     HPI: Alexis [name] is a very pleasant 74 y.o. female here for EGD and colonoscopy for nausea, epigastric pain, diarrhea, h/o colon polyps  Last dose of Eliquis  yesterday morning  The risks and benefits as well as alternatives of endoscopic procedure(s) have been discussed and reviewed.  The patient was provided an opportunity to ask questions and all were answered. The patient agreed with the plan and demonstrated an understanding of the instructions.   Past Medical History:  Diagnosis Date   Anxiety    Arthritis    knees (05/25/2014)   Chronic back pain    Depression    Diabetes mellitus type II    Diabetic peripheral neuropathy (HCC) 10/20/2018   Diverticulitis    Fatty liver    Fibromyalgia    Gastric polyp    hyperplastic   Gastroparesis    recently dx'd (05/25/2014)   GERD (gastroesophageal reflux disease)    H/O hiatal hernia    Hx of gastritis    Hyperlipidemia    Hypertension    Irritable bowel syndrome (IBS)    Memory difficulties 09/18/2017   Migraine without aura, without mention of intractable migraine without mention of status migrainosus    related to allergies; have them in the spring and fall (05/25/2014)   Obesity    Sleep apnea    wore mask; took it off in my sleep; quit wearing it (05/25/2014)   Uterine cancer (HCC) dx'd 2000   surg only   Vasculitis    irritates my legs   Wears glasses    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    Past Surgical History:  Procedure Laterality Date   ANTERIOR CERVICAL DECOMPRESSION/DISCECTOMY FUSION 4 LEVELS N/A 05/07/2018   Procedure: ANTERIOR CERVICAL DECOMPRESSION FUSION, CERVICAL 4-5,CERVICAL 5-6, CERVICAL 6-7 WITH INSTRUMENTATION AND ALLOGRAFT;  Surgeon:  Beuford Anes, MD;  Location: [facility] OR;  Service: Orthopedics;  Laterality: N/A;   APPENDECTOMY  ~ 1967   BIOPSY  07/27/2021   Procedure: BIOPSY;  Surgeon: Leigh Elspeth SQUIBB, MD;  Location: [facility];  Service: Gastroenterology;;   BREAST CYST EXCISION Right 1990   COLONOSCOPY     COLONOSCOPY WITH PROPOFOL  N/A 07/27/2021   Procedure: COLONOSCOPY WITH PROPOFOL ;  Surgeon: Leigh Elspeth SQUIBB, MD;  Location: [facility] ENDOSCOPY;  Service: Gastroenterology;  Laterality: N/A;   ESOPHAGOGASTRODUODENOSCOPY (EGD) WITH PROPOFOL  N/A 07/27/2021   Procedure: ESOPHAGOGASTRODUODENOSCOPY (EGD) WITH PROPOFOL ;  Surgeon: Leigh Elspeth SQUIBB, MD;  Location: [facility];  Service: Gastroenterology;  Laterality: N/A;   HEMOSTASIS CLIP PLACEMENT  07/27/2021   Procedure: HEMOSTASIS CLIP PLACEMENT;  Surgeon: Leigh Elspeth SQUIBB, MD;  Location: [facility];  Service: Gastroenterology;;   LAPAROSCOPIC CHOLECYSTECTOMY  1990   LEFT HEART CATHETERIZATION WITH CORONARY ANGIOGRAM N/A 05/26/2014   Procedure: LEFT HEART CATHETERIZATION WITH CORONARY ANGIOGRAM;  Surgeon: Debby DELENA Sor, MD;  Location: [facility] CATH LAB;  Service: Cardiovascular;  Laterality: N/A;   POLYPECTOMY  07/27/2021   Procedure: POLYPECTOMY;  Surgeon: Leigh Elspeth SQUIBB, MD;  Location: [facility] ENDOSCOPY;  Service: Gastroenterology;;   MATIAS  07/27/2021   Procedure: MATIAS;  Surgeon: Leigh Elspeth SQUIBB, MD;  Location: [facility] ENDOSCOPY;  Service: Gastroenterology;;   SINUS SURGERY WITH INSTATRAK  2000   TOTAL HIP ARTHROPLASTY Left 12/05/2020   Procedure: TOTAL HIP ARTHROPLASTY  ANTERIOR APPROACH;  Surgeon: Fidel Rogue, MD;  Location: [facility] OR;  Service: Orthopedics;  Laterality: Left;   TUBAL LIGATION  ~ 1982   VAGINAL HYSTERECTOMY  2000    Prior to Admission medications  Medication Sig Start Date End Date Taking? Authorizing Provider  acetaminophen  (TYLENOL ) 325 MG tablet Take 2 tablets (650 mg total) by mouth every 6 (six) hours as needed for  mild pain or moderate pain. Patient taking differently: Take 325 mg by mouth every 6 (six) hours as needed for mild pain (pain score 1-3), moderate pain (pain score 4-6) or headache. 12/12/20  Yes Dickie Begun, MD  ALPRAZolam  (XANAX ) 0.5 MG tablet Take 0.5 mg by mouth at bedtime.   Yes [provider]  amLODipine  (NORVASC ) 5 MG tablet TAKE 1 TABLET (5 MG TOTAL) BY MOUTH DAILY. 05/10/24  Yes Emelia Josefa HERO, NP  atorvastatin  (LIPITOR) 20 MG tablet TAKE 1 TABLET BY MOUTH EVERY DAY 03/31/24  Yes Hilty, Vinie BROCKS, MD  b complex vitamins tablet Take 1 tablet by mouth at bedtime.   Yes [provider]  ELIQUIS  5 MG TABS tablet TAKE 1 TABLET BY MOUTH TWICE A DAY 05/28/24  Yes Hilty, Vinie BROCKS, MD  FARXIGA  10 MG TABS tablet Take 10 mg by mouth in the morning. 09/10/19  Yes [provider]  furosemide  (LASIX ) 40 MG tablet TAKE 1 TABLET BY MOUTH EVERY DAY AS NEEDED 05/28/24  Yes Hilty, Vinie BROCKS, MD  insulin  lispro (HUMALOG) 100 UNIT/ML KwikPen Inject 14 Units into the skin 3 (three) times daily.   Yes [provider]  metoprolol  succinate (TOPROL -XL) 50 MG 24 hr tablet Take 1 tablet (50 mg total) by mouth daily. Take with or immediately following a meal. Patient taking differently: Take 50 mg by mouth 2 (two) times daily. Take with or immediately following a meal. Pt taking 2 in the morning and 2 at night 12/04/22 11/26/24 Yes Dahal, Chapman, MD  pantoprazole  (PROTONIX ) 40 MG tablet Take 1 tablet (40 mg total) by mouth 2 (two) times daily. 07/29/21  Yes Akula, Vijaya, MD  pregabalin  (LYRICA ) 75 MG capsule Take 1 capsule (75 mg total) by mouth 3 (three) times daily. 11/27/23  Yes Onita Duos, MD  TRESIBA FLEXTOUCH 100 UNIT/ML FlexTouch Pen Inject 25 Units into the skin at bedtime. 09/29/19  Yes [provider]  valsartan  (DIOVAN ) 160 MG tablet Take 1 tablet (160 mg total) by mouth daily. 08/20/21  Yes Patsy Lenis, MD  venlafaxine  XR (EFFEXOR -XR) 150 MG 24 hr capsule Take 150  mg by mouth 2 (two) times daily.   Yes [provider]  zaleplon (SONATA) 5 MG capsule Take 5 mg by mouth at bedtime.   Yes [provider]  ACIPHEX  20 MG tablet Take 20 mg by mouth in the morning and at bedtime. Patient not taking: Reported on 08/31/2024 05/29/22   [provider]  Calcium  Carb-Cholecalciferol (CALCIUM  1000 + D PO) Take by mouth. Pt says takes half of tab    [provider]  ferrous sulfate  325 (65 FE) MG tablet Take 1 tablet (325 mg total) by mouth every other day. 11/01/22   Beather Delon Gibson, PA  fish oil-omega-3 fatty acids 1000 MG capsule Take 1 g by mouth at bedtime.    [provider]  metoCLOPramide  (REGLAN ) 5 MG tablet Take 1 tablet (5 mg total) by mouth every 12 (twelve) hours as needed for nausea. Take 30-45 minutes before evening and AM doses of bowel preparation solution. 07/27/24  Legrand Victory CROME III, MD  OZEMPIC, 2 MG/DOSE, 8 MG/3ML SOPN Inject 1 Pen into the skin every 7 (seven) days. 11/13/23   [provider]  Rimegepant Sulfate (NURTEC) 75 MG TBDP Take 1 tab at onset of migraine.  May repeat in 2 hrs, if needed.  Max dose: 2 tabs/day. This is a 30 day prescription. 11/27/23   Onita Duos, MD  tobramycin (TOBREX) 0.3 % ophthalmic solution Place 2 drops into both eyes. As needed    [provider]    Current Outpatient Medications  Medication Sig Dispense Refill   acetaminophen  (TYLENOL ) 325 MG tablet Take 2 tablets (650 mg total) by mouth every 6 (six) hours as needed for mild pain or moderate pain. (Patient taking differently: Take 325 mg by mouth every 6 (six) hours as needed for mild pain (pain score 1-3), moderate pain (pain score 4-6) or headache.)     ALPRAZolam  (XANAX ) 0.5 MG tablet Take 0.5 mg by mouth at bedtime.     amLODipine  (NORVASC ) 5 MG tablet TAKE 1 TABLET (5 MG TOTAL) BY MOUTH DAILY. 90 tablet 3   atorvastatin  (LIPITOR) 20 MG tablet TAKE 1 TABLET BY MOUTH EVERY DAY 90 tablet 1   b  complex vitamins tablet Take 1 tablet by mouth at bedtime.     ELIQUIS  5 MG TABS tablet TAKE 1 TABLET BY MOUTH TWICE A DAY 180 tablet 1   FARXIGA  10 MG TABS tablet Take 10 mg by mouth in the morning.     furosemide  (LASIX ) 40 MG tablet TAKE 1 TABLET BY MOUTH EVERY DAY AS NEEDED 90 tablet 3   insulin  lispro (HUMALOG) 100 UNIT/ML KwikPen Inject 14 Units into the skin 3 (three) times daily.     metoprolol  succinate (TOPROL -XL) 50 MG 24 hr tablet Take 1 tablet (50 mg total) by mouth daily. Take with or immediately following a meal. (Patient taking differently: Take 50 mg by mouth 2 (two) times daily. Take with or immediately following a meal. Pt taking 2 in the morning and 2 at night) 30 tablet 2   pantoprazole  (PROTONIX ) 40 MG tablet Take 1 tablet (40 mg total) by mouth 2 (two) times daily. 60 tablet 2   pregabalin  (LYRICA ) 75 MG capsule Take 1 capsule (75 mg total) by mouth 3 (three) times daily. 90 capsule 5   TRESIBA FLEXTOUCH 100 UNIT/ML FlexTouch Pen Inject 25 Units into the skin at bedtime.     valsartan  (DIOVAN ) 160 MG tablet Take 1 tablet (160 mg total) by mouth daily. 30 tablet 3   venlafaxine  XR (EFFEXOR -XR) 150 MG 24 hr capsule Take 150 mg by mouth 2 (two) times daily.     zaleplon (SONATA) 5 MG capsule Take 5 mg by mouth at bedtime.     ACIPHEX  20 MG tablet Take 20 mg by mouth in the morning and at bedtime. (Patient not taking: Reported on 08/31/2024)     Calcium  Carb-Cholecalciferol (CALCIUM  1000 + D PO) Take by mouth. Pt says takes half of tab     ferrous sulfate  325 (65 FE) MG tablet Take 1 tablet (325 mg total) by mouth every other day. 30 tablet 3   fish oil-omega-3 fatty acids 1000 MG capsule Take 1 g by mouth at bedtime.     metoCLOPramide  (REGLAN ) 5 MG tablet Take 1 tablet (5 mg total) by mouth every 12 (twelve) hours as needed for nausea. Take 30-45 minutes before evening and AM doses of bowel preparation solution. 2 tablet 0   OZEMPIC,  2 MG/DOSE, 8 MG/3ML SOPN Inject 1 Pen into the  skin every 7 (seven) days.     Rimegepant Sulfate (NURTEC) 75 MG TBDP Take 1 tab at onset of migraine.  May repeat in 2 hrs, if needed.  Max dose: 2 tabs/day. This is a 30 day prescription. 12 tablet 11   tobramycin (TOBREX) 0.3 % ophthalmic solution Place 2 drops into both eyes. As needed     Current Facility-Administered Medications  Medication Dose Route Frequency Provider Last Rate Last Admin   0.9 %  sodium chloride  infusion  500 mL Intravenous Once Tami Blass V, MD        Allergies as of 08/31/2024 - Review Complete 08/31/2024  Allergen Reaction Noted   Aspirin  Other (See Comments) 02/09/2014   Codeine Itching and Rash 08/24/2010   Naproxen Nausea Only 02/09/2014   Sulfonamide derivatives Itching and Rash 08/24/2010    Family History  Problem Relation Age of Onset   Colon polyps Father    Heart disease Father    Breast cancer Sister    COPD Brother    Ovarian cancer Maternal Aunt    Stomach cancer Maternal Aunt    Diabetes Maternal Aunt    Colon cancer Maternal Uncle    Heart disease Maternal Uncle    Irritable bowel syndrome Daughter    Heart disease Other        Grandparents   Esophageal cancer Neg Hx    Pancreatic cancer Neg Hx    Rectal cancer Neg Hx     Social History   Socioeconomic History   Marital status: Married    Spouse name: Meron Bocchino   Number of children: 2   Years of education: Not on file   Highest education level: Some college, no degree  Occupational History   Occupation: Retired    Associate Professor: RETIRED  Tobacco Use   Smoking status: Never   Smokeless tobacco: Never  Vaping Use   Vaping status: Never Used  Substance and Sexual Activity   Alcohol use: No   Drug use: No   Sexual activity: Not Currently  Other Topics Concern   Not on file  Social History Narrative   Daily Caffeine, Coke   Social Drivers of Health   Tobacco Use: Low Risk (08/31/2024)   Patient History    Smoking Tobacco Use: Never    Smokeless Tobacco Use:  Never    Passive Exposure: Not on file  Financial Resource Strain: Not on file  Food Insecurity: No Food Insecurity (11/28/2022)   Hunger Vital Sign    Worried About Running Out of Food in the Last Year: Never true    Ran Out of Food in the Last Year: Never true  Transportation Needs: No Transportation Needs (11/28/2022)   PRAPARE - Administrator, Civil Service (Medical): No    Lack of Transportation (Non-Medical): No  Physical Activity: Not on file  Stress: Not on file  Social Connections: Not on file  Intimate Partner Violence: Not At Risk (11/28/2022)   Humiliation, Afraid, Rape, and Kick questionnaire    Fear of Current or Ex-Partner: No    Emotionally Abused: No    Physically Abused: No    Sexually Abused: No  Depression (PHQ2-9): Low Risk (12/09/2022)   Depression (PHQ2-9)    PHQ-2 Score: 1  Alcohol Screen: Not on file  Housing: Low Risk (11/28/2022)   Housing    Last Housing Risk Score: 0  Utilities: Not At Risk (11/28/2022)   [facility]  Utilities    Threatened with loss of utilities: No  Health Literacy: Not on file    Review of Systems:  All other review of systems negative except as mentioned in the HPI.  Physical Exam: Vital signs in last 24 hours: BP (!) 159/68   Pulse 68   Temp 97.7 F (36.5 C) (Temporal)   Resp 13   Ht 5' 4 (1.626 m)   Wt 174 lb (78.9 kg)   SpO2 98%   BMI 29.87 kg/m  General:   Alert, NAD Lungs:  Clear .   Heart:  Irregular rate and rhythm Abdomen:  Soft, nontender and nondistended. Neuro/Psych:  Alert and cooperative. Normal mood and affect. A and O x 3  Reviewed labs, radiology imaging, old records and pertinent past GI work up  Patient is appropriate for planned procedure(s) and anesthesia in an ambulatory setting   K. Veena Brittin Belnap , MD [phone]

## 2024-08-31 NOTE — Op Note (Signed)
 [facility] Patient Name: Alexis [name] Procedure Date: 08/31/2024 2:27 PM MRN: [id number] Endoscopist: Gustav ALONSO Mcgee , MD, [id number] Age: 74 Referring MD:  Date of Birth: April 21, 1951 Gender: Female Account #: [id number] Procedure:                Upper GI endoscopy Indications:              Epigastric abdominal pain, Nausea Medicines:                Monitored Anesthesia Care Procedure:                Pre-Anesthesia Assessment:                           - Prior to the procedure, a History and Physical                            was performed, and patient medications and                            allergies were reviewed. The patient's tolerance of                            previous anesthesia was also reviewed. The risks                            and benefits of the procedure and the sedation                            options and risks were discussed with the patient.                            All questions were answered, and informed consent                            was obtained. Prior Anticoagulants: The patient                            last took Eliquis  (apixaban ) 1 day prior to the                            procedure. ASA Grade Assessment: III - A patient                            with severe systemic disease. After reviewing the                            risks and benefits, the patient was deemed in                            satisfactory condition to undergo the procedure.                           After obtaining informed consent, the endoscope was  passed under direct vision. Throughout the                            procedure, the patient's blood pressure, pulse, and                            oxygen saturations were monitored continuously. The                            Olympus scope [id number] was introduced through the                            mouth, and advanced to the second part of duodenum.                             The upper GI endoscopy was accomplished without                            difficulty. The patient tolerated the procedure                            well. Scope In: Scope Out: Findings:                 The Z-line was regular and was found 38 cm from the                            incisors.                           The esophagus was normal.                           A single 5 mm mucosal papule (nodule) with no                            bleeding and stigmata of recent bleeding was found                            at the gastroesophageal junction. The nodule was                            Paris classification Is (protruding, sessile).                            Biopsies were taken with a cold forceps for                            histology.                           The exam of the stomach was otherwise normal.                           The first portion of the duodenum and second  portion of the duodenum were normal. Biopsies were                            taken with a cold forceps for histology. Biopsies                            for histology were taken with a cold forceps for                            evaluation of celiac disease. Complications:            No immediate complications. Estimated Blood Loss:     Estimated blood loss was minimal. Impression:               - Z-line regular, 38 cm from the incisors.                           - Normal esophagus.                           - A single mucosal papule (nodule) found in the                            stomach. Biopsied.                           - Normal first portion of the duodenum and second                            portion of the duodenum. Biopsied. Recommendation:           - Resume previous diet.                           - Continue present medications.                           - Await pathology results.                           - See the other procedure note for documentation of                             additional recommendations. Quint Chestnut V. Christophe Rising, MD 08/31/2024 4:17:44 PM This report has been signed electronically.

## 2024-08-31 NOTE — Progress Notes (Signed)
 Called to room to assist during endoscopic procedure.  Patient ID and intended procedure confirmed with present staff. Received instructions for my participation in the procedure from the performing physician.

## 2024-09-01 ENCOUNTER — Telehealth: Payer: Self-pay | Admitting: *Deleted

## 2024-09-01 NOTE — Telephone Encounter (Signed)
 Left message on f/u call

## 2024-09-03 ENCOUNTER — Ambulatory Visit: Payer: Self-pay | Admitting: Gastroenterology

## 2024-09-03 LAB — SURGICAL PATHOLOGY

## 2024-09-06 ENCOUNTER — Encounter: Payer: Self-pay | Admitting: *Deleted

## 2024-09-06 NOTE — Progress Notes (Signed)
 Alexis [name]                                          MRN: [id number]   09/06/2024   The [facility] Quality Team Specialist reviewed this patient medical record for the purposes of chart review for care gap closure. The following were reviewed: chart review for care gap closure-glycemic status assessment.    [facility] Quality Team

## 2024-09-14 ENCOUNTER — Other Ambulatory Visit: Payer: Self-pay | Admitting: Internal Medicine

## 2024-10-14 ENCOUNTER — Ambulatory Visit: Admitting: Gastroenterology
